# Patient Record
Sex: Female | Born: 1950
Health system: Southern US, Community
[De-identification: ages and names within clinical notes are randomized; demographics above are authoritative.]

## PROBLEM LIST (undated history)

## (undated) DIAGNOSIS — I10 Essential (primary) hypertension: Secondary | ICD-10-CM

## (undated) DIAGNOSIS — R609 Edema, unspecified: Secondary | ICD-10-CM

## (undated) DIAGNOSIS — R0902 Hypoxemia: Secondary | ICD-10-CM

## (undated) DIAGNOSIS — E041 Nontoxic single thyroid nodule: Secondary | ICD-10-CM

## (undated) DIAGNOSIS — E785 Hyperlipidemia, unspecified: Secondary | ICD-10-CM

## (undated) DIAGNOSIS — E669 Obesity, unspecified: Secondary | ICD-10-CM

## (undated) DIAGNOSIS — I499 Cardiac arrhythmia, unspecified: Secondary | ICD-10-CM

## (undated) DIAGNOSIS — G4730 Sleep apnea, unspecified: Secondary | ICD-10-CM

## (undated) DIAGNOSIS — H269 Unspecified cataract: Secondary | ICD-10-CM

## (undated) DIAGNOSIS — D219 Benign neoplasm of connective and other soft tissue, unspecified: Secondary | ICD-10-CM

## (undated) DIAGNOSIS — M1990 Unspecified osteoarthritis, unspecified site: Secondary | ICD-10-CM

## (undated) HISTORY — DX: Essential (primary) hypertension: I10

## (undated) HISTORY — DX: Unspecified cataract: H26.9

## (undated) HISTORY — DX: Unspecified osteoarthritis, unspecified site: M19.90

## (undated) HISTORY — DX: Edema, unspecified: R60.9

## (undated) HISTORY — DX: Hyperlipidemia, unspecified: E78.5

## (undated) HISTORY — DX: Benign neoplasm of connective and other soft tissue, unspecified: D21.9

## (undated) HISTORY — DX: Sleep apnea, unspecified: G47.30

## (undated) HISTORY — DX: Cardiac arrhythmia, unspecified: I49.9

## (undated) HISTORY — PX: TONSILLECTOMY: SUR1361

## (undated) HISTORY — DX: Obesity, unspecified: E66.9

## (undated) HISTORY — PX: HERNIA REPAIR: SHX51

## (undated) HISTORY — PX: MOUTH SURGERY: SHX715

## (undated) HISTORY — PX: OTHER SURGICAL HISTORY: SHX169

## (undated) HISTORY — DX: Hypoxemia: R09.02

## (undated) HISTORY — DX: Nontoxic single thyroid nodule: E04.1

## (undated) HISTORY — PX: TOTAL ABDOMINAL HYSTERECTOMY: SHX209

---

## 1997-09-08 ENCOUNTER — Ambulatory Visit (HOSPITAL_COMMUNITY): Admission: RE | Admit: 1997-09-08 | Discharge: 1997-09-08 | Payer: Self-pay | Admitting: Gynecology

## 1997-10-13 ENCOUNTER — Ambulatory Visit (HOSPITAL_COMMUNITY): Admission: AD | Admit: 1997-10-13 | Discharge: 1997-10-13 | Payer: Self-pay | Admitting: Gynecology

## 1998-01-29 ENCOUNTER — Other Ambulatory Visit: Admission: RE | Admit: 1998-01-29 | Discharge: 1998-01-29 | Payer: Self-pay | Admitting: Obstetrics and Gynecology

## 1998-04-11 ENCOUNTER — Other Ambulatory Visit: Admission: RE | Admit: 1998-04-11 | Discharge: 1998-04-11 | Payer: Self-pay | Admitting: Obstetrics and Gynecology

## 1999-03-07 ENCOUNTER — Ambulatory Visit (HOSPITAL_COMMUNITY): Admission: RE | Admit: 1999-03-07 | Discharge: 1999-03-07 | Payer: Self-pay | Admitting: Obstetrics and Gynecology

## 1999-03-07 ENCOUNTER — Encounter: Payer: Self-pay | Admitting: Obstetrics and Gynecology

## 1999-05-13 ENCOUNTER — Other Ambulatory Visit: Admission: RE | Admit: 1999-05-13 | Discharge: 1999-05-13 | Payer: Self-pay | Admitting: Obstetrics and Gynecology

## 2000-06-12 ENCOUNTER — Encounter: Payer: Self-pay | Admitting: Obstetrics and Gynecology

## 2000-06-12 ENCOUNTER — Ambulatory Visit (HOSPITAL_COMMUNITY): Admission: RE | Admit: 2000-06-12 | Discharge: 2000-06-12 | Payer: Self-pay | Admitting: Obstetrics and Gynecology

## 2000-09-23 ENCOUNTER — Other Ambulatory Visit: Admission: RE | Admit: 2000-09-23 | Discharge: 2000-09-23 | Payer: Self-pay | Admitting: Obstetrics and Gynecology

## 2001-06-29 ENCOUNTER — Encounter: Payer: Self-pay | Admitting: Internal Medicine

## 2001-06-29 ENCOUNTER — Encounter: Admission: RE | Admit: 2001-06-29 | Discharge: 2001-06-29 | Payer: Self-pay | Admitting: Internal Medicine

## 2002-01-28 ENCOUNTER — Ambulatory Visit (HOSPITAL_COMMUNITY): Admission: RE | Admit: 2002-01-28 | Discharge: 2002-01-28 | Payer: Self-pay | Admitting: Internal Medicine

## 2002-01-28 ENCOUNTER — Encounter: Payer: Self-pay | Admitting: Internal Medicine

## 2004-04-24 ENCOUNTER — Ambulatory Visit: Payer: Self-pay | Admitting: Internal Medicine

## 2004-05-06 ENCOUNTER — Other Ambulatory Visit: Admission: RE | Admit: 2004-05-06 | Discharge: 2004-05-06 | Payer: Self-pay | Admitting: Obstetrics and Gynecology

## 2004-11-05 ENCOUNTER — Encounter: Payer: Self-pay | Admitting: Internal Medicine

## 2004-11-27 ENCOUNTER — Ambulatory Visit: Payer: Self-pay | Admitting: Internal Medicine

## 2005-02-25 ENCOUNTER — Ambulatory Visit: Payer: Self-pay | Admitting: Internal Medicine

## 2005-02-28 ENCOUNTER — Ambulatory Visit: Payer: Self-pay | Admitting: Internal Medicine

## 2005-03-25 ENCOUNTER — Ambulatory Visit: Payer: Self-pay | Admitting: Family Medicine

## 2005-04-01 ENCOUNTER — Ambulatory Visit: Payer: Self-pay | Admitting: Internal Medicine

## 2005-04-08 ENCOUNTER — Ambulatory Visit: Payer: Self-pay | Admitting: Internal Medicine

## 2005-06-26 ENCOUNTER — Encounter: Admission: RE | Admit: 2005-06-26 | Discharge: 2005-06-26 | Payer: Self-pay | Admitting: Internal Medicine

## 2005-06-26 ENCOUNTER — Ambulatory Visit: Payer: Self-pay | Admitting: Internal Medicine

## 2005-07-03 ENCOUNTER — Ambulatory Visit: Payer: Self-pay | Admitting: Internal Medicine

## 2006-06-15 ENCOUNTER — Ambulatory Visit: Payer: Self-pay | Admitting: Internal Medicine

## 2006-12-28 DIAGNOSIS — J4540 Moderate persistent asthma, uncomplicated: Secondary | ICD-10-CM | POA: Insufficient documentation

## 2006-12-28 DIAGNOSIS — J45909 Unspecified asthma, uncomplicated: Secondary | ICD-10-CM | POA: Insufficient documentation

## 2006-12-28 DIAGNOSIS — J45901 Unspecified asthma with (acute) exacerbation: Secondary | ICD-10-CM | POA: Insufficient documentation

## 2007-03-05 ENCOUNTER — Ambulatory Visit: Payer: Self-pay | Admitting: Internal Medicine

## 2007-03-05 DIAGNOSIS — I10 Essential (primary) hypertension: Secondary | ICD-10-CM | POA: Insufficient documentation

## 2007-03-05 DIAGNOSIS — R000 Tachycardia, unspecified: Secondary | ICD-10-CM | POA: Insufficient documentation

## 2007-03-26 ENCOUNTER — Ambulatory Visit: Payer: Self-pay | Admitting: Internal Medicine

## 2007-03-26 LAB — CONVERTED CEMR LAB
Bilirubin Urine: NEGATIVE
Blood in Urine, dipstick: NEGATIVE
Glucose, Urine, Semiquant: NEGATIVE
Ketones, urine, test strip: NEGATIVE
Nitrite: NEGATIVE
Specific Gravity, Urine: 1.015
Urobilinogen, UA: 0.2
WBC Urine, dipstick: NEGATIVE
pH: 7.5

## 2007-03-29 LAB — CONVERTED CEMR LAB
ALT: 17 units/L (ref 0–35)
AST: 16 units/L (ref 0–37)
Albumin: 3.3 g/dL — ABNORMAL LOW (ref 3.5–5.2)
Alkaline Phosphatase: 85 units/L (ref 39–117)
BUN: 9 mg/dL (ref 6–23)
Basophils Absolute: 0 10*3/uL (ref 0.0–0.1)
Basophils Relative: 0.1 % (ref 0.0–1.0)
Bilirubin, Direct: 0.1 mg/dL (ref 0.0–0.3)
CO2: 33 meq/L — ABNORMAL HIGH (ref 19–32)
Calcium: 9 mg/dL (ref 8.4–10.5)
Chloride: 98 meq/L (ref 96–112)
Cholesterol: 192 mg/dL (ref 0–200)
Creatinine, Ser: 0.7 mg/dL (ref 0.4–1.2)
Eosinophils Absolute: 0.6 10*3/uL (ref 0.0–0.6)
Eosinophils Relative: 6.5 % — ABNORMAL HIGH (ref 0.0–5.0)
Free T4: 1.1 ng/dL (ref 0.6–1.6)
GFR calc Af Amer: 112 mL/min
GFR calc non Af Amer: 92 mL/min
Glucose, Bld: 95 mg/dL (ref 70–99)
HCT: 36.7 % (ref 36.0–46.0)
HDL: 29.9 mg/dL — ABNORMAL LOW (ref >39.0)
Hemoglobin: 12.2 g/dL (ref 12.0–15.0)
LDL Cholesterol: 145 mg/dL — ABNORMAL HIGH (ref 0–99)
Lymphocytes Relative: 19.9 % (ref 12.0–46.0)
MCHC: 33.2 g/dL (ref 30.0–36.0)
MCV: 83.7 fL (ref 78.0–100.0)
Monocytes Absolute: 0.6 10*3/uL (ref 0.2–0.7)
Monocytes Relative: 6.6 % (ref 3.0–11.0)
Neutro Abs: 5.8 10*3/uL (ref 1.4–7.7)
Neutrophils Relative %: 66.9 % (ref 43.0–77.0)
Platelets: 336 10*3/uL (ref 150–400)
Potassium: 4.3 meq/L (ref 3.5–5.1)
RBC: 4.38 M/uL (ref 3.87–5.11)
RDW: 13.7 % (ref 11.5–14.6)
Sodium: 139 meq/L (ref 135–145)
T3, Free: 3.3 pg/mL (ref 2.3–4.2)
TSH: 3.7 microintl units/mL (ref 0.35–5.50)
Total Bilirubin: 0.7 mg/dL (ref 0.3–1.2)
Total CHOL/HDL Ratio: 6.4
Total Protein: 6.4 g/dL (ref 6.0–8.3)
Triglycerides: 87 mg/dL (ref 0–149)
VLDL: 17 mg/dL (ref 0–40)
WBC: 8.8 10*3/uL (ref 4.5–10.5)

## 2007-04-06 ENCOUNTER — Ambulatory Visit: Payer: Self-pay | Admitting: Internal Medicine

## 2007-04-06 DIAGNOSIS — E782 Mixed hyperlipidemia: Secondary | ICD-10-CM | POA: Insufficient documentation

## 2007-04-21 ENCOUNTER — Telehealth: Payer: Self-pay | Admitting: Internal Medicine

## 2007-05-20 ENCOUNTER — Ambulatory Visit: Payer: Self-pay | Admitting: Internal Medicine

## 2007-05-24 LAB — CONVERTED CEMR LAB
ALT: 17 units/L (ref 0–35)
AST: 16 units/L (ref 0–37)
Cholesterol: 166 mg/dL (ref 0–200)
HDL: 34 mg/dL — ABNORMAL LOW (ref >39.0)
LDL Cholesterol: 120 mg/dL — ABNORMAL HIGH (ref 0–99)
Total CHOL/HDL Ratio: 4.9
Triglycerides: 60 mg/dL (ref 0–149)
VLDL: 12 mg/dL (ref 0–40)

## 2007-07-26 ENCOUNTER — Ambulatory Visit: Payer: Self-pay | Admitting: Internal Medicine

## 2007-07-26 DIAGNOSIS — T887XXA Unspecified adverse effect of drug or medicament, initial encounter: Secondary | ICD-10-CM | POA: Insufficient documentation

## 2007-07-28 ENCOUNTER — Telehealth: Payer: Self-pay | Admitting: *Deleted

## 2007-07-28 LAB — CONVERTED CEMR LAB
ALT: 17 units/L (ref 0–35)
AST: 19 units/L (ref 0–37)
Albumin: 3.2 g/dL — ABNORMAL LOW (ref 3.5–5.2)
Alkaline Phosphatase: 83 units/L (ref 39–117)
Bilirubin, Direct: 0.1 mg/dL (ref 0.0–0.3)
Cholesterol: 144 mg/dL (ref 0–200)
HDL: 32.8 mg/dL — ABNORMAL LOW (ref >39.0)
LDL Cholesterol: 99 mg/dL (ref 0–99)
Total Bilirubin: 0.6 mg/dL (ref 0.3–1.2)
Total CHOL/HDL Ratio: 4.4
Total Protein: 6 g/dL (ref 6.0–8.3)
Triglycerides: 61 mg/dL (ref 0–149)
VLDL: 12 mg/dL (ref 0–40)

## 2007-11-22 ENCOUNTER — Ambulatory Visit: Payer: Self-pay | Admitting: Internal Medicine

## 2007-11-22 DIAGNOSIS — K625 Hemorrhage of anus and rectum: Secondary | ICD-10-CM | POA: Insufficient documentation

## 2007-11-22 LAB — CONVERTED CEMR LAB: Hemoglobin: 13 g/dL

## 2007-12-16 ENCOUNTER — Ambulatory Visit: Payer: Self-pay | Admitting: Gastroenterology

## 2007-12-27 ENCOUNTER — Ambulatory Visit: Payer: Self-pay | Admitting: Gastroenterology

## 2007-12-27 HISTORY — PX: COLONOSCOPY: SHX174

## 2007-12-27 LAB — HM COLONOSCOPY

## 2008-01-11 ENCOUNTER — Telehealth: Payer: Self-pay | Admitting: Family Medicine

## 2008-01-11 ENCOUNTER — Emergency Department (HOSPITAL_COMMUNITY): Admission: EM | Admit: 2008-01-11 | Discharge: 2008-01-12 | Payer: Self-pay | Admitting: *Deleted

## 2008-01-13 ENCOUNTER — Ambulatory Visit: Payer: Self-pay | Admitting: Family Medicine

## 2008-01-13 DIAGNOSIS — J209 Acute bronchitis, unspecified: Secondary | ICD-10-CM | POA: Insufficient documentation

## 2008-01-13 DIAGNOSIS — E042 Nontoxic multinodular goiter: Secondary | ICD-10-CM | POA: Insufficient documentation

## 2008-01-16 ENCOUNTER — Telehealth: Payer: Self-pay | Admitting: Internal Medicine

## 2008-01-17 ENCOUNTER — Encounter: Admission: RE | Admit: 2008-01-17 | Discharge: 2008-01-17 | Payer: Self-pay | Admitting: Family Medicine

## 2008-01-20 ENCOUNTER — Ambulatory Visit: Payer: Self-pay | Admitting: Family Medicine

## 2008-01-21 LAB — CONVERTED CEMR LAB
Free T4: 1.2 ng/dL (ref 0.6–1.6)
T3, Free: 3.2 pg/mL (ref 2.3–4.2)
TSH: 2.41 microintl units/mL (ref 0.35–5.50)

## 2008-01-25 ENCOUNTER — Encounter: Payer: Self-pay | Admitting: Family Medicine

## 2008-01-26 ENCOUNTER — Telehealth: Payer: Self-pay | Admitting: Family Medicine

## 2008-03-28 ENCOUNTER — Ambulatory Visit: Payer: Self-pay | Admitting: Internal Medicine

## 2008-03-28 DIAGNOSIS — K429 Umbilical hernia without obstruction or gangrene: Secondary | ICD-10-CM | POA: Insufficient documentation

## 2008-05-10 ENCOUNTER — Ambulatory Visit: Payer: Self-pay | Admitting: Internal Medicine

## 2008-05-15 LAB — CONVERTED CEMR LAB
ALT: 19 units/L (ref 0–35)
AST: 18 units/L (ref 0–37)
Albumin: 3.3 g/dL — ABNORMAL LOW (ref 3.5–5.2)
Alkaline Phosphatase: 84 units/L (ref 39–117)
BUN: 13 mg/dL (ref 6–23)
Bilirubin, Direct: 0.1 mg/dL (ref 0.0–0.3)
CO2: 34 meq/L — ABNORMAL HIGH (ref 19–32)
Calcium: 9 mg/dL (ref 8.4–10.5)
Chloride: 106 meq/L (ref 96–112)
Cholesterol: 144 mg/dL (ref 0–200)
Creatinine, Ser: 0.8 mg/dL (ref 0.4–1.2)
GFR calc Af Amer: 95 mL/min
GFR calc non Af Amer: 79 mL/min
Glucose, Bld: 96 mg/dL (ref 70–99)
HDL: 41.4 mg/dL (ref >39.0)
LDL Cholesterol: 91 mg/dL (ref 0–99)
Potassium: 4.3 meq/L (ref 3.5–5.1)
Sodium: 141 meq/L (ref 135–145)
TSH: 2.71 microintl units/mL (ref 0.35–5.50)
Total Bilirubin: 0.9 mg/dL (ref 0.3–1.2)
Total CHOL/HDL Ratio: 3.5
Total Protein: 6.9 g/dL (ref 6.0–8.3)
Triglycerides: 58 mg/dL (ref 0–149)
VLDL: 12 mg/dL (ref 0–40)

## 2008-09-25 ENCOUNTER — Ambulatory Visit: Payer: Self-pay | Admitting: Internal Medicine

## 2008-10-20 ENCOUNTER — Ambulatory Visit: Payer: Self-pay | Admitting: Internal Medicine

## 2008-10-20 LAB — CONVERTED CEMR LAB: Rapid Strep: NEGATIVE

## 2009-01-24 ENCOUNTER — Telehealth: Payer: Self-pay | Admitting: *Deleted

## 2009-01-29 DIAGNOSIS — E041 Nontoxic single thyroid nodule: Secondary | ICD-10-CM

## 2009-01-29 HISTORY — DX: Nontoxic single thyroid nodule: E04.1

## 2009-02-06 ENCOUNTER — Other Ambulatory Visit: Admission: RE | Admit: 2009-02-06 | Discharge: 2009-02-06 | Payer: Self-pay | Admitting: Interventional Radiology

## 2009-02-06 ENCOUNTER — Encounter: Payer: Self-pay | Admitting: Internal Medicine

## 2009-02-06 ENCOUNTER — Encounter (INDEPENDENT_AMBULATORY_CARE_PROVIDER_SITE_OTHER): Payer: Self-pay | Admitting: Interventional Radiology

## 2009-02-06 ENCOUNTER — Encounter: Admission: RE | Admit: 2009-02-06 | Discharge: 2009-02-06 | Payer: Self-pay | Admitting: Internal Medicine

## 2009-02-19 ENCOUNTER — Telehealth: Payer: Self-pay | Admitting: *Deleted

## 2009-05-08 ENCOUNTER — Telehealth: Payer: Self-pay | Admitting: Internal Medicine

## 2009-07-16 ENCOUNTER — Ambulatory Visit: Payer: Self-pay | Admitting: Internal Medicine

## 2009-07-23 ENCOUNTER — Encounter: Payer: Self-pay | Admitting: *Deleted

## 2009-07-23 LAB — CONVERTED CEMR LAB
ALT: 22 units/L (ref 0–35)
AST: 19 units/L (ref 0–37)
Albumin: 3.5 g/dL (ref 3.5–5.2)
Alkaline Phosphatase: 93 units/L (ref 39–117)
BUN: 10 mg/dL (ref 6–23)
Bilirubin, Direct: 0 mg/dL (ref 0.0–0.3)
CO2: 34 meq/L — ABNORMAL HIGH (ref 19–32)
Calcium: 8.8 mg/dL (ref 8.4–10.5)
Chloride: 101 meq/L (ref 96–112)
Cholesterol: 156 mg/dL (ref 0–200)
Creatinine, Ser: 0.7 mg/dL (ref 0.4–1.2)
Free T4: 1 ng/dL (ref 0.6–1.6)
GFR calc non Af Amer: 91.25 mL/min (ref >60)
Glucose, Bld: 112 mg/dL — ABNORMAL HIGH (ref 70–99)
HDL: 41.1 mg/dL (ref >39.00)
LDL Cholesterol: 96 mg/dL (ref 0–99)
Potassium: 3.9 meq/L (ref 3.5–5.1)
Sodium: 141 meq/L (ref 135–145)
TSH: 2.88 microintl units/mL (ref 0.35–5.50)
Total Bilirubin: 0.5 mg/dL (ref 0.3–1.2)
Total CHOL/HDL Ratio: 4
Total Protein: 7.1 g/dL (ref 6.0–8.3)
Triglycerides: 93 mg/dL (ref 0.0–149.0)
VLDL: 18.6 mg/dL (ref 0.0–40.0)

## 2009-12-11 ENCOUNTER — Encounter: Payer: Self-pay | Admitting: Internal Medicine

## 2010-01-28 ENCOUNTER — Encounter: Payer: Self-pay | Admitting: Internal Medicine

## 2010-01-31 ENCOUNTER — Ambulatory Visit: Payer: Self-pay | Admitting: Internal Medicine

## 2010-01-31 DIAGNOSIS — R82998 Other abnormal findings in urine: Secondary | ICD-10-CM | POA: Insufficient documentation

## 2010-01-31 DIAGNOSIS — T7840XA Allergy, unspecified, initial encounter: Secondary | ICD-10-CM | POA: Insufficient documentation

## 2010-01-31 LAB — CONVERTED CEMR LAB
Bilirubin Urine: NEGATIVE
Blood in Urine, dipstick: NEGATIVE
Glucose, Urine, Semiquant: NEGATIVE
Ketones, urine, test strip: NEGATIVE
Nitrite: NEGATIVE
Protein, U semiquant: NEGATIVE
Specific Gravity, Urine: 1.01
Urobilinogen, UA: 0.2
WBC Urine, dipstick: NEGATIVE
pH: 7

## 2010-03-13 ENCOUNTER — Telehealth: Payer: Self-pay | Admitting: Internal Medicine

## 2010-04-03 ENCOUNTER — Telehealth: Payer: Self-pay | Admitting: *Deleted

## 2010-04-29 ENCOUNTER — Other Ambulatory Visit: Payer: Self-pay | Admitting: Obstetrics and Gynecology

## 2010-04-29 ENCOUNTER — Ambulatory Visit
Admission: RE | Admit: 2010-04-29 | Discharge: 2010-04-29 | Payer: Self-pay | Source: Home / Self Care | Attending: Obstetrics and Gynecology | Admitting: Obstetrics and Gynecology

## 2010-04-29 ENCOUNTER — Other Ambulatory Visit (HOSPITAL_COMMUNITY)
Admission: RE | Admit: 2010-04-29 | Discharge: 2010-04-29 | Disposition: A | Discharge status: Home / Self Care | Payer: Self-pay | Source: Ambulatory Visit | Attending: Obstetrics and Gynecology | Admitting: Obstetrics and Gynecology

## 2010-04-29 DIAGNOSIS — Z124 Encounter for screening for malignant neoplasm of cervix: Secondary | ICD-10-CM | POA: Insufficient documentation

## 2010-04-30 NOTE — Assessment & Plan Note (Signed)
Summary: RECTAL BLEED/YF   History of Present Illness Visit Type: consult Primary GI MD: Sheryn Bison MD FACP FAGA Primary Provider: Berniece Andreas, MD Requesting Provider: Berniece Andreas, MD Chief Complaint: rectal bleeding History of Present Illness:   This patient is a 60 year old white female referred through the courtesy of Dr. Fabian Sharp for evaluation of intermittent rectal bleeding and mild constipation without associated rectal pain.  For several years [name] Has had asymptomatic intermittent Rectal bleeding without rectal pain. She has mild constipation takes daily Metamucil. She has not had previous barium studies or colonoscopy exams. She denies any upper gastrointestinal or any hepatobiliary problems. Her appetite is good and her weight is stable. She is in good health except for mild hypertension and hyperlipidemia. Family history is noncontributory.    GI Review of Systems      Denies abdominal pain, acid reflux, belching, bloating, chest pain, dysphagia with liquids, dysphagia with solids, heartburn, loss of appetite, nausea, vomiting, vomiting blood, weight loss, and  weight gain.      Reports constipation and  rectal bleeding.     Denies anal fissure, black tarry stools, change in bowel habit, diarrhea, diverticulosis, fecal incontinence, heme positive stool, hemorrhoids, irritable bowel syndrome, jaundice, light color stool, liver problems, and  rectal pain.     Prior Medications Reviewed Using: List Brought by Patient  Updated Prior Medication List: ADVAIR DISKUS 100-50 MCG/DOSE MISC (FLUTICASONE-SALMETEROL)  PROAIR HFA 108 (90 BASE) MCG/ACT AERS (ALBUTEROL SULFATE) Inhale 2 puff as directed four times a day LISINOPRIL 20 MG  TABS (LISINOPRIL) 1 by mouth once daily GLUCOSAMINE-VITAMIN D 1000-200 MG-UNIT TABS (GLUCOSAMINE-VITAMIN D)  MEVACOR 40 MG  TABS (LOVASTATIN) 1 by mouth once daily B-100 COMPLEX  TABS (VITAMINS-LIPOTROPICS)  METAMUCIL 30.9 % POWD  (PSYLLIUM) once daily MULTIVITAL  CHEW (MULTIPLE VITAMINS-MINERALS) once daily  Current Allergies: No known allergies   Past Medical History:    Reviewed history from 11/22/2007 and no changes required:       HT        Obesity       Fibroids       Edema       Asthma   Past Surgical History:    Reviewed history from 11/22/2007 and no changes required:       Denies surgical history           Family History:    Reviewed history from 11/22/2007 and no changes required:       Family History of Alcoholism/Addiction: Uncle       Family History of Arthritis: Mother       Family History Other cancer       Family History of Cardiovascular disorder: Father       Family History of Neurological disorder: Mother       neg colon cancer or bowel diseases         Family History of Pancreatic Cancer:Mother:       Family History of Heart Disease: Brother x2       Thyroid disease: Sister  Social History:    Reviewed history from 11/22/2007 and no changes required:       Occupation:Admin assistant       Single       Never Smoked       Alcohol use-ocassional       Drug use-no       Regular exercise-no      Risk Factors:  Caffeine use:  1 drinks per day Seatbelt use:  100 % Sun Exposure:  occasionally   Review of Systems  The patient denies allergy/sinus, anemia, anxiety-new, arthritis/joint pain, back pain, blood in urine, breast changes/lumps, change in vision, confusion, cough, coughing up blood, depression-new, fainting, fatigue, fever, headaches-new, hearing problems, heart murmur, heart rhythm changes, itching, menstrual pain, muscle pains/cramps, night sweats, nosebleeds, pregnancy symptoms, shortness of breath, skin rash, sleeping problems, sore throat, swelling of feet/legs, swollen lymph glands, thirst - excessive , urination - excessive , urination changes/pain, urine leakage, vision changes, and voice change.     Vital Signs:  Patient Profile:   60 Years Old  Female Height:     68 inches Weight:      294.38 pounds BMI:     44.92 Pulse rate:   80 / minute Pulse rhythm:   regular BP sitting:   128 / 74  (left arm)  Vitals Entered By: June McMurray CMA (December 16, 2007 8:57 AM)                  Physical Exam  General:     Well developed, well nourished, no acute distress.healthy appearing.   Head:     Normocephalic and atraumatic. Eyes:     PERRLA, no icterus. Neck:     Supple; no masses or thyromegaly. Lungs:     Clear throughout to auscultation. Heart:     Regular rate and rhythm; no murmurs, rubs,  or bruits. Abdomen:     Soft, nontender and nondistended. No masses, hepatosplenomegaly or hernias noted. Normal bowel sounds. Rectal:     Normal exam.hemocult negative.   Extremities:     She has rather marked edema and pigmentary changes of stasis dermatitis in both pretibial areas. I cannot appreciate acute phlebitis or evidence of cellulitis. Neurologic:     Alert and  oriented x4;  grossly normal neurologically. Psych:     Alert and cooperative. Normal mood and affect.    Impression & Recommendations:  Problem # 1:  RECTAL BLEEDING (ICD-569.3) Assessment: Unchanged I have scheduled her for colonoscopy to exclude underlying colonic polyposis. I cannot appreciate hemorrhoids or other abnormalities on rectal exam today. I have decided to check a CBC and anemia profile also. She is to continue her Metamucil and high-fiber diet as tolerated. Orders: Colonoscopy (Colon)   Problem # 2:  HYPERTENSION (ICD-401.9) Assessment: Unchanged Continue medications as per Dr. Fabian Sharp  Problem # 3:  HYPERLIPIDEMIA (ICD-272.2) Assessment: Improved   Patient Instructions: 1)  Copy Sent To:Dr. Berniece Andreas 2)  Constipation and  Hemorrhoids brochure given. 3)  Colonoscopy and Flexible Sigmoidoscopy brochure given. 4)  Conscious Sedation brochure given. 5)  [facility] Patient Information Guide given to patient. 6)   Seynabou Fults was given the [facility] Prep today.    ]

## 2010-04-30 NOTE — Progress Notes (Signed)
Summary: WANTS THYROID BIOPSY SCHEDULED  Phone Note Call from Patient Call back at Home Phone [phone]   Caller: Patient-LIVE CALL Reason for Call: Referral Summary of Call: PT WOULD LIKE TO GO AHEAD WITH THE BIOPSY OF THE THYROID....NEEDS TO KNOW IF IT IS BILATERAL OR UNILATERAL? SHE HAD AN ULTRASOUND OVER A YEAR AGO. IS ANOTHER ULTRASOUND NECESSARY. SHE NEED TO KNOW BEACAUSE SHE IS SELF-PAY. HAVE TERRI TO CALL HER WITH THE ANSWERS AND BIOPSY APPT. PT WOULD LIKE TO GO TO [facility]. Initial call taken by: Warnell Forester,  January 24, 2009 11:21 AM  Follow-up for Phone Call        i am not sure if needs to get another [place]   .  Call radiology and see if  we need to do this ahead of time  otherwise  schedule biopsy.of thyroid.  Follow-up by: Madelin Headings MD,  January 24, 2009 2:28 PM  Additional Follow-up for Phone Call Additional follow up Details #1::        [facility] and spoke to [place]. She states that she does need another thyroid US. Order sent to [facility] Pt aware of this. Additional Follow-up by: Romualdo Bolk, CMA (AAMA),  January 24, 2009 4:53 PM

## 2010-04-30 NOTE — Assessment & Plan Note (Signed)
Summary: 6 month rov/njr rsc bmp/njr rsc with pt/mhf   Vital Signs:  Patient profile:   60 year old female Menstrual status:  postmenopausal Height:      68 inches Weight:      305 pounds BMI:     46.54 Pulse rate:   100 / minute BP sitting:   142 / 78  (left arm) Cuff size:   large  Vitals Entered By: Army Fossa CMA (September 25, 2008 11:03 AM) CC: 6 month ROV on BP , Hypertension Management  years   days  Menstrual Status postmenopausal   History of Present Illness: Amy Stokes  comes in fror follow up   She still has no insurance and finances are liniting.  BP   ok at blood donation.   Advair hs helps :  only ocass wheeziy at night .  Thyroid nodule  seems no change ot her. Is aware  biopsy is rec  Lipids  nose of meds   Hypertension History:      She complains of side effects from treatment, but denies headache, chest pain, palpitations, dyspnea with exertion, orthopnea, PND, peripheral edema, visual symptoms, neurologic problems, and syncope.  She notes the following problems with antihypertensive medication side effects: dry cough occasionally. [name]        Positive major cardiovascular risk factors include female age 30 years old or older, hyperlipidemia, and hypertension.  Negative major cardiovascular risk factors include non-tobacco-user status.     Preventive Screening-Counseling & Management  Alcohol-Tobacco     Alcohol drinks/day: <1     Smoking Status: quit  Caffeine-Diet-Exercise     Caffeine use/day: 1-2 cups a day      Does Patient Exercise: no  Current Medications (verified): 1)  Advair Diskus 100-50 Mcg/dose Misc (Fluticasone-Salmeterol) .... Once Daily 2)  Proair Hfa 108 (90 Base) Mcg/act Aers (Albuterol Sulfate) .... Inhale 2 Puff As Directed Four Times A Day 3)  Lisinopril 20 Mg  Tabs (Lisinopril) .[name].. 1 By Mouth Once Daily 4)  Glucosamine-Vitamin D 1000-200 Mg-Unit Tabs (Glucosamine-Vitamin D) 5)  Mevacor 40 Mg  Tabs (Lovastatin) .[name].. 1 By Mouth  Once Daily 6)  B-100 Complex  Tabs (Vitamins-Lipotropics) 7)  Metamucil 30.9 % Powd (Psyllium) .... Once Daily 8)  Multivital  Chew (Multiple Vitamins-Minerals) .... Once Daily  Allergies (verified): No Known Drug Allergies  Past History:  Past medical, surgical, family and social histories (including risk factors) reviewed, and no changes noted (except as noted below).  Past Medical History: Reviewed history from 03/28/2008 and no changes required. HT  Obesity Fibroids Edema Asthma     Past Surgical History: Reviewed history from 11/22/2007 and no changes required. Denies surgical history    Family History: Reviewed history from 03/28/2008 and no changes required. Family History of Alcoholism/Addiction: Uncle Family History of Arthritis: Mother Family History Other cancer Family History of Cardiovascular disorder: Father Family History of Neurological disorder: Mother neg colon cancer or bowel diseases   Family History of Pancreatic Cancer:Mother: Family History of Heart Disease: Brother x2 Thyroid disease: Sister  ? nodules   Social History: Reviewed history from 03/28/2008 and no changes required. Occupation:Admin assistant Single Never Smoked Alcohol use-ocassional Drug use-no Regular exercise-no      Smoking Status:  quit Caffeine use/day:  1-2 cups a day   Review of Systems  The patient denies anorexia, fever, chest pain, syncope, dyspnea on exertion, abdominal pain, unusual weight change, abnormal bleeding, enlarged lymph nodes, and angioedema.    Physical Exam  General:  Well-developed,well-nourished,in no acute distress; alert,appropriate and cooperative throughout examination Head:  normocephalic and atraumatic.   Neck:  thyroid palpable no frmness or tendernss   left  Lungs:  Normal respiratory effort, chest expands symmetrically. Lungs are clear to auscultation, no crackles or wheezes. Heart:  Normal rate and regular rhythm. S1 and S2 normal  without gallop, murmur, click, rub or other extra sounds. Pulses:  pulses intact without delay   Extremities:  no clubbing cyanosis or tace edema  Skin:  turgor normal and color normal.   Cervical Nodes:  No lymphadenopathy noted Psych:  Normal eye contact, appropriate affect. Cognition appears normal.    Impression & Recommendations:  Problem # 1:  THYROID NODULE (ICD-241.0)  complex and seeming ly no change   she should look into  cost  of getting th biopsy   Problem # 2:  HYPERTENSION (ICD-401.9)  Her updated medication list for this problem includes:    Lisinopril 20 Mg Tabs (Lisinopril) .[name]... 1 by mouth once daily  BP today: 142/78 Prior BP: 128/68 (03/28/2008)  10 Yr Risk Heart Disease: 11 % Prior 10 Yr Risk Heart Disease: 17 % (04/06/2007)  Labs Reviewed: K+: 4.3 (05/10/2008) Creat: : 0.8 (05/10/2008)   Chol: 144 (05/10/2008)   HDL: 41.4 (05/10/2008)   LDL: 91 (05/10/2008)   TG: 58 (05/10/2008)  Problem # 3:  ASTHMA (ICD-493.90) Assessment: Unchanged controlling on one puff per day  .   cost is a factor with no insurance  Her updated medication list for this problem includes:    Advair Diskus 100-50 Mcg/dose Misc (Fluticasone-salmeterol) ..... Once daily    Proair Hfa 108 (90 Base) Mcg/act Aers (Albuterol sulfate) ..... Inhale 2 puff as directed four times a day  Problem # 4:  HYPERLIPIDEMIA (ICD-272.2)  Her updated medication list for this problem includes:    Mevacor 40 Mg Tabs (Lovastatin) .[name]... 1 by mouth once daily  Labs Reviewed: SGOT: 18 (05/10/2008)   SGPT: 19 (05/10/2008)  10 Yr Risk Heart Disease: 11 % Prior 10 Yr Risk Heart Disease: 17 % (04/06/2007)   HDL:41.4 (05/10/2008), 32.8 (07/26/2007)  LDL:91 (05/10/2008), 99 (07/26/2007)  Chol:144 (05/10/2008), 144 (07/26/2007)  Trig:58 (05/10/2008), 61 (07/26/2007)  Complete Medication List: 1)  Advair Diskus 100-50 Mcg/dose Misc (Fluticasone-salmeterol) .... Once daily 2)  Proair Hfa 108 (90 Base) Mcg/act  Aers (Albuterol sulfate) .... Inhale 2 puff as directed four times a day 3)  Lisinopril 20 Mg Tabs (Lisinopril) .[name].. 1 by mouth once daily 4)  Glucosamine-vitamin D 1000-200 Mg-unit Tabs (Glucosamine-vitamin d) 5)  Mevacor 40 Mg Tabs (Lovastatin) .[name].. 1 by mouth once daily 6)  B-100 Complex Tabs (Vitamins-lipotropics) 7)  Metamucil 30.9 % Powd (Psyllium) .... Once daily 8)  Multivital Chew (Multiple vitamins-minerals) .... Once daily  Hypertension Assessment/Plan:      The patient's hypertensive risk group is category B: At least one risk factor (excluding diabetes) with no target organ damage.  Her calculated 10 year risk of coronary heart disease is 11 %.  Today's blood pressure is 142/78.  Her blood pressure goal is < 140/90.  Patient Instructions: 1)  check bp readings 3 x per a week if elevated . 2)  continue medications. 3)  look into  and call when we can do thyroid biopsy

## 2010-04-30 NOTE — Assessment & Plan Note (Signed)
Summary: fup meds//ccm   Vital Signs:  Patient profile:   60 year old female Menstrual status:  postmenopausal Weight:      305 pounds Pulse rate:   100 / minute BP sitting:   140 / 86  (left arm) Cuff size:   large  Vitals Entered By: Romualdo Bolk, CMA [name]) (July 16, 2009 2:44 PM) CC: Follow-up visit on meds, Hypertension Management   History of Present Illness: Amy Stokes comes in comes in today  for  follow up of her multiple medical problems . Since her last visit she still has no health insurance benefits. Her update on med issues :  HT: She presents a log of yher readings and all but one are in goal range .   No se of meds . PUlse tends to run high But she does have  a cough but doesnt nec think its the ace   She has asthma and on inhalers controller advair with out se. Dry cough  trying generic zyrtec with help.  some wheexzng at times   LIPIDS: no  s/e of meds seen  Thyroid  :  aware of indeterminant  bx on her thyroid nodule that is large . No obv change in size or dyshpaghia or pain.    Has no  insurance coverage for surgery.  Obesity  continuing.   aware of interventions.  Hypertension History:      She complains of palpitations, dyspnea with exertion, peripheral edema, and side effects from treatment, but denies headache, chest pain, orthopnea, PND, visual symptoms, neurologic problems, and syncope.  She notes the following problems with antihypertensive medication side effects: Dry cough but has decrease some with cetizine.        Positive major cardiovascular risk factors include female age 64 years old or older, hyperlipidemia, and hypertension.  Negative major cardiovascular risk factors include non-tobacco-user status.     Preventive Screening-Counseling & Management  Alcohol-Tobacco     Alcohol drinks/day: <1     Smoking Status: quit  Caffeine-Diet-Exercise     Caffeine use/day: 1-2 cups a day      Does Patient Exercise: no  Current  Medications (verified): 1)  Advair Diskus 100-50 Mcg/dose Misc (Fluticasone-Salmeterol) .... Once Daily 2)  Proair Hfa 108 (90 Base) Mcg/act Aers (Albuterol Sulfate) .... Inhale 2 Puff As Directed Four Times A Day 3)  Lisinopril 20 Mg  Tabs (Lisinopril) .[name].. 1 By Mouth Once Daily 4)  Mevacor 40 Mg  Tabs (Lovastatin) .[name].. 1 By Mouth Once Daily 5)  Metamucil 30.9 % Powd (Psyllium) .... Once Daily 6)  Multivital  Chew (Multiple Vitamins-Minerals) .... Once Daily 7)  Vitamin D 1000 Unit  Tabs (Cholecalciferol) 8)  Fish Oil   Oil (Fish Oil) 9)  Omega-3 350 Mg Caps (Omega-3 Fatty Acids) 10)  Cetirizine Hcl 10 Mg Tabs (Cetirizine Hcl)  Allergies (verified): No Known Drug Allergies  Past History:  Past medical, surgical, family and social histories (including risk factors) reviewed, and no changes noted (except as noted below).  Past Medical History: HT  Obesity Fibroids Edema Asthma    Thyroid nodule left discovered on chest ct  : Needle  bx" indeterminant " cannot r/o follicular neoplasm)   Nov 2010  Past Surgical History: Denies surgical history thyroid needle bx.     Past History:  Care Management: None Current  Family History: Reviewed history from 03/28/2008 and no changes required. Family History of Alcoholism/Addiction: Uncle Family History of Arthritis: Mother Family History Other cancer  Family History of Cardiovascular disorder: Father Family History of Neurological disorder: Mother neg colon cancer or bowel diseases   Family History of Pancreatic Cancer:Mother: Family History of Heart Disease: Brother x2 Thyroid disease: Sister  ? nodules   Social History: Reviewed history from 03/28/2008 and no changes required. Occupation:Admin assistant  currently unemployed  Married   Never Smoked Alcohol use-ocassional Drug use-no Regular exercise-no    hh of 2    pet cat.   Review of Systems       The patient complains of prolonged cough.  The patient denies  anorexia, fever, weight loss, weight gain, abdominal pain, melena, hematochezia, severe indigestion/heartburn, hematuria, difficulty walking, abnormal bleeding, and enlarged lymph nodes.         inpast hx of irreg palpitations   Physical Exam  General:  Well-developed,well-nourished,in no acute distress; alert,appropriate and cooperative throughout examination Head:  normocephalic and atraumatic.   Eyes:  vision grossly intact, pupils equal, and pupils round.   Ears:  R ear normal, L ear normal, and no external deformities.   Nose:  no external deformity, no external erythema, and no nasal discharge.   Mouth:  pharynx pink and moist.   Neck:  ? prominent left thyroid but no mass felt  no tenderness or ademopathy Lungs:  Normal respiratory effort, chest expands symmetrically. Lungs are clear to auscultation, no crackles or wheezes.no dullness.   Heart:  Normal rate and regular rhythm. S1 and S2 normal without gallop, murmur, click, rub or other extra sounds.no lifts.   Abdomen:  Bowel sounds positive,abdomen soft and non-tender without masses, organomegaly  noted.  3 cm umbi nontender hernia noted  Pulses:  pulses intact without delay   Extremities:  no clubbing cyanosis or 1+ edema  dry skin no break down  Neurologic:  alert & oriented X3 and gait normal.   Skin:  turgor normal and color normal.   Cervical Nodes:  No lymphadenopathy noted Psych:  Oriented X3, good eye contact, not anxious appearing, and not depressed appearing.     Impression & Recommendations:  Problem # 1:  HYPERTENSION (ICD-401.9) controlled  Her updated medication list for this problem includes:    Lisinopril 20 Mg Tabs (Lisinopril) .[name]... 1 by mouth once daily    Losartan Potassium 100 Mg Tabs (Losartan potassium) .[name]... Take 1/2 to 1 by mouth once daily for high blood pressure  Orders: TLB-BMP (Basic Metabolic Panel-BMET) (80048-METABOL)  Problem # 2:  COUGH (ICD-786.2) allergy vs se of ace    no alarm  features to this  but rec a trial of arb perhaps    depending on cost   Problem # 3:  ASTHMA (ICD-493.90) seems stable sample adviar given  Her updated medication list for this problem includes:    Advair Diskus 100-50 Mcg/dose Misc (Fluticasone-salmeterol) ..... Once daily    Proair Hfa 108 (90 Base) Mcg/act Aers (Albuterol sulfate) ..... Inhale 2 puff as directed four times a day  Problem # 4:  THYROID NODULE (ICD-241.0)  left  ? path   should have this removed but no insurance is an issues and no signs currently   Problem # 5:  HYPERLIPIDEMIA (ICD-272.2)  Her updated medication list for this problem includes:    Mevacor 40 Mg Tabs (Lovastatin) .[name]... 1 by mouth once daily  Orders: TLB-Lipid Panel (80061-LIPID) TLB-Hepatic/Liver Function Pnl (80076-HEPATIC)  Problem # 6:  MORBID OBESITY (ICD-278.01) Assessment: Unchanged  Complete Medication List: 1)  Advair Diskus 100-50 Mcg/dose Misc (Fluticasone-salmeterol) .... Once daily  2)  Proair Hfa 108 (90 Base) Mcg/act Aers (Albuterol sulfate) .... Inhale 2 puff as directed four times a day 3)  Lisinopril 20 Mg Tabs (Lisinopril) .[name].. 1 by mouth once daily 4)  Mevacor 40 Mg Tabs (Lovastatin) .[name].. 1 by mouth once daily 5)  Metamucil 30.9 % Powd (Psyllium) .... Once daily 6)  Multivital Chew (Multiple vitamins-minerals) .... Once daily 7)  Vitamin D 1000 Unit Tabs (Cholecalciferol) 8)  Fish Oil Oil (Fish oil) 9)  Omega-3 350 Mg Caps (Omega-3 fatty acids) 10)  Cetirizine Hcl 10 Mg Tabs (Cetirizine hcl) 11)  Losartan Potassium 100 Mg Tabs (Losartan potassium) .... Take 1/2 to 1 by mouth once daily for high blood pressure  Other Orders: TLB-TSH (Thyroid Stimulating Hormone) (84443-TSH) TLB-T4 (Thyrox), Free [phone]) Venipuncture (40981)  Hypertension Assessment/Plan:      The patient's hypertensive risk group is category B: At least one risk factor (excluding diabetes) with no target organ damage.  Her calculated 10 year risk of  coronary heart disease is 11 %.  Today's blood pressure is 140/86.  Her blood pressure goal is < 140/90.  Patient Instructions: 1)  continue on advair  2)  can consider trying a diferrent BP med to see if helps  irritative cough. 3)  You will be informed of lab results when available.  and plan   appropriate follow up .  4)  Losing weight will help you medical condition. Prescriptions: LOSARTAN POTASSIUM 100 MG TABS (LOSARTAN POTASSIUM) take 1/2 to 1 by mouth once daily for high blood pressure  #30 x 3   Entered and Authorized by:   Madelin Headings MD   Signed by:   Madelin Headings MD on 07/16/2009   Method used:   Print then Give to Patient   RxID:   [id number]

## 2010-04-30 NOTE — Progress Notes (Signed)
  Phone Note Call from Patient Call back at Home Phone [phone]   Caller: Patient Summary of Call: respiratory symptoms are better but still with fever 102 last night but no shakes or chills Wonders about the blood culture since she never heard back about that  P: since she feels better and temp down to 100.8 today--can probably wait for now. If she feels worse, she should have someone bring her to the ER for further eval. Checked with [facility]-------------1st culture is coag negative staph and 2nd is negative to date. Discussed with patient--she has no indwelling lines so this is probably a contaminant Initial call taken by: Cindee Salt MD,  January 16, 2008 11:04 AM

## 2010-04-30 NOTE — Assessment & Plan Note (Signed)
Summary: RECTAL BLEEDING/NJR   Vital Signs:  Patient Profile:   60 Years Old Female Weight:      288 pounds Temp:     99.2 degrees F oral Pulse rate:   66 / minute BP sitting:   110 / 60  (left arm) Cuff size:   large  Vitals Entered By: Romualdo Bolk, CMA (November 22, 2007 3:38 PM)                 Chief Complaint:  Rectal Bleeding.  History of Present Illness: Amy Stokes is here foracute appt today  rectal bleeding that has been going on and off for 2 weeks.  2 weeks ago had firm BM and ? discomfort  ....    had blood BR external to stool   then went away and today has returned without hard stool  . seems like a lit to her . Not mixed in  taking   OTC fivvber supp to help.    No fever weight loss diarrhea abdominal pain. No fam hx of colon cancer  Has no insurance  still.        Prior Medications Reviewed Using: Patient Recall  Updated Prior Medication List: ADVAIR DISKUS 100-50 MCG/DOSE MISC (FLUTICASONE-SALMETEROL)  PROAIR HFA 108 (90 BASE) MCG/ACT AERS (ALBUTEROL SULFATE) Inhale 2 puff as directed four times a day LISINOPRIL 20 MG  TABS (LISINOPRIL) 1 by mouth once daily GLUCOSAMINE-VITAMIN D 1000-200 MG-UNIT TABS (GLUCOSAMINE-VITAMIN D)  MEVACOR 40 MG  TABS (LOVASTATIN) 1 by mouth once daily B-100 COMPLEX  TABS (VITAMINS-LIPOTROPICS)   Current Allergies (reviewed today): No known allergies   Past Medical History:    HT     Obesity    Fibroids    Edema    Asthma    Hyperglycemia    Past Surgical History:    Denies surgical history        Family History:    Family History of Alcoholism/Addiction    Family History of Arthritis    Family History Other cancer    Family History of Cardiovascular disorder    Family History of Neurological disorder    neg colon cancer or bowel diseases    Social History:    Occupation:    Single    Never Smoked    Alcohol use-ocassional    Drug use-no    Regular exercise-no       Review of  Systems  The patient denies anorexia, fever, weight loss, abdominal pain, melena, and hematuria.     Physical Exam  General:     alert, well-developed, and well-nourished.   Head:     normocephalic and atraumatic.   Neck:     No deformities, masses, or tenderness noted. Abdomen:     Bowel sounds positive,abdomen soft and non-tender without masses, organomegaly or hernias noted. Rectal:     normal sphincter tone, no masses, and stool positive for occult blood.  minimal  amt of brown stool with heme positive   hemorrhoidal tag noted  Extremities:     no cce  Skin:     turgor normal, color normal, no ecchymoses, and no petechiae.   Cervical Nodes:     No lymphadenopathy noted Psych:     normally interactive, good eye contact, and slightly anxious.      Impression & Recommendations:  Problem # 1:  RECTAL BLEEDING (ICD-569.3) possible fissure or hemorrhoidal  ..stool softeners  but rec GI consultation    Orders: Hgb (69629) Gastroenterology Referral (  GI)   Complete Medication List: 1)  Advair Diskus 100-50 Mcg/dose Misc (Fluticasone-salmeterol) 2)  Proair Hfa 108 (90 Base) Mcg/act Aers (Albuterol sulfate) .... Inhale 2 puff as directed four times a day 3)  Lisinopril 20 Mg Tabs (Lisinopril) .[name].. 1 by mouth once daily 4)  Glucosamine-vitamin D 1000-200 Mg-unit Tabs (Glucosamine-vitamin d) 5)  Mevacor 40 Mg Tabs (Lovastatin) .[name].. 1 by mouth once daily 6)  B-100 Complex Tabs (Vitamins-lipotropics)   Patient Instructions: 1)  stool softener    2)  will call about gi referral and no insurance    ] Laboratory Results   Blood Tests    Date/Time Reported: November 22, 2007 4:23 PM    CBC HGB:  13.0 g/dL   (Normal Range: 42.6-83.4 in Males, 12.0-15.0 in Females) Comments: Wynona Canes, CMA  November 22, 2007 4:23 PM

## 2010-04-30 NOTE — Progress Notes (Signed)
Summary: Temp 102,   Phone Note Call from Patient Call back at Home Phone [phone]   Caller: Patient Call For: Panosh/Fry Summary of Call: Pt began with scratchy feeling in chest yesterday, today temp of 102, using Tylenol for fever, staying home, drinking plenty of fluids, and resting.  Pt has hx of asthma and some other medical hx, has not had the regular flu shot yet.  Pt informed Dr Fabian Sharp out of the office, Dr Clent Ridges will be informed. Initial call taken by: Sid Falcon LPN,  January 11, 2008 4:21 PM  Follow-up for Phone Call        use symptomatic measures as above. we can see her tomorrow if she is not better Follow-up by: Nelwyn Salisbury MD,  January 11, 2008 4:49 PM  Additional Follow-up for Phone Call Additional follow up Details #1::        Pt given appt tomorrow. Additional Follow-up by: Lynann Beaver CMA,  January 11, 2008 5:27 PM

## 2010-04-30 NOTE — Consult Note (Signed)
Summary: [facility] Nose [facility] Thro[facility]-Dr. Ezzard Standing  Ear, Nose & Throat-Dr. Ezzard Standing   Imported By: Maryln Gottron 02/03/2008 13:13:47  _____________________________________________________________________  External Attachment:    Type:   Image     Comment:   External Document

## 2010-04-30 NOTE — Letter (Signed)
Summary: Lipid Letter  [facility]  [address] [place], [place] [address]   Phone: [phone]  Fax: [phone]    07/23/2009  [facility] [address] [place], [place]  [address]  Dear Alvino Chapel:  We have carefully reviewed your last lipid profile from 07/16/2009 and the results are noted below with a summary of recommendations for lipid management.    Cholesterol:       156     Goal: <   HDL "good" Cholesterol:   27.25     Goal: >   LDL "bad" Cholesterol:   96     Goal: <   Triglycerides:       93.0     Goal: <    The rest of your labs are normal. Please call our office in 6-12 months to schedule a follow up appointment in 6-12 months.    TLC Diet (Therapeutic Lifestyle Change): Saturated Fats & Transfatty acids should be kept < 7% of total calories ***Reduce Saturated Fats Polyunstaurated Fat can be up to 10% of total calories Monounsaturated Fat Fat can be up to 20% of total calories Total Fat should be no greater than 25-35% of total calories Carbohydrates should be 50-60% of total calories Protein should be approximately 15% of total calories Fiber should be at least 20-30 grams a day ***Increased fiber may help lower LDL Total Cholesterol should be < 200mg /day Consider adding plant stanol/sterols to diet (example: Benacol spread) ***A higher intake of unsaturated fat may reduce Triglycerides and Increase HDL    Adjunctive Measures (may lower LIPIDS and reduce risk of Heart Attack) include: Aerobic Exercise (20-30 minutes 3-4 times a week) Limit Alcohol Consumption Weight Reduction Aspirin 75-81 mg a day by mouth (if not allergic or contraindicated) Dietary Fiber 20-30 grams a day by mouth     Current Medications: 1)    Advair Diskus 100-50 Mcg/dose Misc (Fluticasone-salmeterol) .... Once daily 2)    Proair Hfa 108 (90 Base) Mcg/act Aers (Albuterol sulfate) .... Inhale 2 puff as directed four times a day 3)    Lisinopril 20 Mg  Tabs (Lisinopril)  .[name].. 1 by mouth once daily 4)    Mevacor 40 Mg  Tabs (Lovastatin) .[name].. 1 by mouth once daily 5)    Metamucil 30.9 % Powd (Psyllium) .... Once daily 6)    Multivital  Chew (Multiple vitamins-minerals) .... Once daily 7)    Vitamin D 1000 Unit  Tabs (Cholecalciferol) 8)    Fish Oil   Oil (Fish oil) 9)    Omega-3 350 Mg Caps (Omega-3 fatty acids) 10)    Cetirizine Hcl 10 Mg Tabs (Cetirizine hcl) 11)    Losartan Potassium 100 Mg Tabs (Losartan potassium) .... Take 1/2 to 1 by mouth once daily for high blood pressure  If you have any questions, please call. We appreciate being able to work with you.   Sincerely,    [facility]

## 2010-04-30 NOTE — Assessment & Plan Note (Signed)
Summary: sore throat/dm   Vital Signs:  Patient profile:   60 year old female Menstrual status:  postmenopausal Weight:      297 pounds BMI:     45.32 Temp:     98.7 degrees F oral BP sitting:   126 / 80  (left arm) Cuff size:   large  Vitals Entered By: Raechel Ache, RN (October 20, 2008 11:07 AM)  Primary Care Provider:  Berniece Andreas, MD  CC:  C/o sore throat x 3 days- worsening.[name]  History of Present Illness: 60 year old patient with a 3-day history of sore throatshe has had minimal nonproductive cough.  She denies any fever or strep exposure.  She does have a history of asthma, which has been stable denies any shortness of breath or wheezing.  history of hypertension, which has been stable  Allergies: No Known Drug Allergies  Past History:  Past Medical History: Reviewed history from 03/28/2008 and no changes required. HT  Obesity Fibroids Edema Asthma     Review of Systems  The patient denies anorexia, fever, weight loss, weight gain, vision loss, decreased hearing, hoarseness, chest pain, syncope, dyspnea on exertion, peripheral edema, prolonged cough, headaches, hemoptysis, abdominal pain, melena, hematochezia, severe indigestion/heartburn, hematuria, incontinence, genital sores, muscle weakness, suspicious skin lesions, transient blindness, difficulty walking, depression, unusual weight change, abnormal bleeding, enlarged lymph nodes, angioedema, and breast masses.    Physical Exam  General:  overweight-appearing.   Head:  Normocephalic and atraumatic without obvious abnormalities. No apparent alopecia or balding. Eyes:  No corneal or conjunctival inflammation noted. EOMI. Perrla. Funduscopic exam benign, without hemorrhages, exudates or papilledema. Vision grossly normal. Ears:  External ear exam shows no significant lesions or deformities.  Otoscopic examination reveals clear canals, tympanic membranes are intact bilaterally without bulging, retraction,  inflammation or discharge. Hearing is grossly normal bilaterally. Mouth:  pharyngeal erythema.   Neck:  No deformities, masses, or tenderness noted. no adenopathy Lungs:  Normal respiratory effort, chest expands symmetrically. Lungs are clear to auscultation, no crackles or wheezes. Heart:  Normal rate and regular rhythm. S1 and S2 normal without gallop, murmur, click, rub or other extra sounds.   Impression & Recommendations:  Problem # 1:  HYPERTENSION (ICD-401.9)  Her updated medication list for this problem includes:    Lisinopril 20 Mg Tabs (Lisinopril) .[name]... 1 by mouth once daily  Problem # 2:  PHARYNGITIS-ACUTE (ICD-462)  Complete Medication List: 1)  Advair Diskus 100-50 Mcg/dose Misc (Fluticasone-salmeterol) .... Once daily 2)  Proair Hfa 108 (90 Base) Mcg/act Aers (Albuterol sulfate) .... Inhale 2 puff as directed four times a day 3)  Lisinopril 20 Mg Tabs (Lisinopril) .[name].. 1 by mouth once daily 4)  Glucosamine-vitamin D 1000-200 Mg-unit Tabs (Glucosamine-vitamin d) 5)  Mevacor 40 Mg Tabs (Lovastatin) .[name].. 1 by mouth once daily 6)  B-100 Complex Tabs (Vitamins-lipotropics) 7)  Metamucil 30.9 % Powd (Psyllium) .... Once daily 8)  Multivital Chew (Multiple vitamins-minerals) .... Once daily  Other Orders: Rapid Strep (59563)  Patient Instructions: 1)  Get plenty of rest, drink lots of clear liquids, and use Tylenol for fever and comfort. Return in 7-10 days if you're not better:sooner if you're feeling worse.  Laboratory Results    Other Tests  Rapid Strep: negative Comments: Rita Ohara  October 20, 2008 11:23 AM

## 2010-04-30 NOTE — Assessment & Plan Note (Signed)
Summary: blood pressure high/mhf   Vital Signs:  Patient Profile:   60 Years Old Female Weight:      298 pounds Pulse rate:   78 / minute BP sitting:   150 / 84  (left arm) Cuff size:   large  Vitals Entered By: Romualdo Bolk, CMA (March 05, 2007 2:03 PM)                 Chief Complaint:  Follow up on BP.  History of Present Illness: Amy Stokes comes in today for feeling of elevated hr last pm and noted 100 pulse range  and bp 156/84 and 148/81  . Bothered her sleep.  No new meds.  Did have a bit more caffiene that day and 1 glass of wine. No CP or SOB  No hx of same.    Feels fine today  No change with advair. Asthma ok . Last BP check last month was 147/80  and then 135/82. Is on 1/2 lisinopril once daily .   Last labs "a while ago"  Hypertension History:      She denies headache, dyspnea with exertion, orthopnea, peripheral edema, visual symptoms, neurologic problems, and syncope.        Positive major cardiovascular risk factors include female age 76 years old or older and hypertension.  Negative major cardiovascular risk factors include non-tobacco-user status.     Current Allergies (reviewed today): No known allergies   Past Medical History:    Reviewed history from 12/28/2006 and no changes required:       HT        Obesity       Fibroids       Edema       Asthma       Hyperglycemia   Family History:    Reviewed history from 12/28/2006 and no changes required:       Family History of Alcoholism/Addiction       Family History of Arthritis       Family History Other cancer       Family History of Cardiovascular disorder       Family History of Neurological disorder  Social History:    Reviewed history from 12/28/2006 and no changes required:       Occupation:       Single       Never Smoked       Alcohol use-ocassiona       Drug use-no       Regular exercise-no    Review of Systems       see hpi   Physical Exam  General:  Well-developed,well-nourished,in no acute distress; alert,appropriate and cooperative throughout examination Head:     normocephalic and atraumatic.   Neck:     No deformities, masses, or tenderness noted. Lungs:     Normal respiratory effort, chest expands symmetrically. Lungs are clear to auscultation, no crackles or wheezes. Heart:     Normal rate and regular rhythm. S1 and S2 normal without gallop, murmur, click, rub or other extra sounds. Abdomen:     Bowel sounds positive,abdomen soft and non-tender without masses, organomegaly or hernias noted. 3cm umbi hernia flat Pulses:     present Neurologic:     non focal Skin:     turgor normal and color normal.   Cervical Nodes:     No lymphadenopathy noted Additional Exam:     EKG NSR   no acute changes.   Serial Vital  Signs/Assessments:  Time      Position  BP       Pulse  Resp  Temp     By                     142/84                         Madelin Headings MD    Impression & Recommendations:  Problem # 1:  TACHYCARDIA (ICD-785.0) probably from combo caffiene and etoh   due for labs and schedule for these before next visit. R/O metabolic etiology plan cpx labs panel with free t3 and free t4  then rov in about 1 months or as needed . Orders: EKG w/ Interpretation (93000)   Problem # 2:  HYPERTENSION (ICD-401.9) only taking 1/2 pill so go up to 1 po qd instead   and rov in 3-4 weeks( need to clarify dosing at next visit.) Her updated medication list for this problem includes:    Lisinopril 20 Mg Tabs (Lisinopril) .[name]... 1 by mouth once daily  Orders: EKG w/ Interpretation (93000)  BP today: 150/84 repeat 140's    Problem # 3:  ASTHMA (ICD-493.90) Assessment: Unchanged stable The following medications were removed from the medication list:    Albuterol 90 Mcg/act Aers (Albuterol)  Her updated medication list for this problem includes:    Advair Diskus 100-50 Mcg/dose Misc (Fluticasone-salmeterol)    Proair Hfa 108  (90 Base) Mcg/act Aers (Albuterol sulfate) ..... Inhale 2 puff as directed four times a day   Complete Medication List: 1)  Advair Diskus 100-50 Mcg/dose Misc (Fluticasone-salmeterol) 2)  Proair Hfa 108 (90 Base) Mcg/act Aers (Albuterol sulfate) .... Inhale 2 puff as directed four times a day 3)  Lisinopril 20 Mg Tabs (Lisinopril) .[name].. 1 by mouth once daily 4)  Vitamin D 1000 Unit Caps (Cholecalciferol)  Hypertension Assessment/Plan:      The patient's hypertensive risk group is category B: At least one risk factor (excluding diabetes) with no target organ damage.  Today's blood pressure is 150/84.  Her blood pressure goal is < 140/90.     ]

## 2010-04-30 NOTE — Progress Notes (Signed)
Summary: List of Blood Pressure Readings  List of Blood Pressure Readings   Imported By: Maryln Gottron 07/31/2009 13:12:57  _____________________________________________________________________  External Attachment:    Type:   Image     Comment:   External Document

## 2010-04-30 NOTE — Progress Notes (Signed)
Summary: albuteral rx   Phone Note Call from Patient Call back at Home Phone [phone]   Caller: patient live Call For: Panosh  Summary of Call: albuteral inhaler.  [facility]  Initial call taken by: Roselle Locus,  April 21, 2007 3:12 PM  Follow-up for Phone Call        Per md-ok x 1. Sent thru EMR. Follow-up by: Romualdo Bolk, CMA,  April 21, 2007 5:04 PM      Prescriptions: PROAIR HFA 108 (90 BASE) MCG/ACT AERS (ALBUTEROL SULFATE) Inhale 2 puff as directed four times a day  #1 x 0   Entered by:   Romualdo Bolk, CMA   Authorized by:   Madelin Headings MD   Signed by:   Romualdo Bolk, CMA on 04/21/2007   Method used:   Electronically sent to ...       [id number]       [address] [place], [place]  [address]       Ph: [phone]       Fax: [phone]   RxID:   [id number]

## 2010-04-30 NOTE — Progress Notes (Signed)
Summary: about rx  Phone Note Call from Patient Call back at [phone]   Caller: patient live Call For: Panosh Summary of Call: patient spoke to pharmacy about mevacor they receiced direction as 1 pill aday 20mg .   Initial call taken by: Celine Ahr,  July 28, 2007 10:19 AM  Follow-up for Phone Call        Sent thru EMR. Pt aware of this. Follow-up by: Romualdo Bolk, CMA,  July 28, 2007 10:30 AM      Prescriptions: MEVACOR 40 MG  TABS (LOVASTATIN) 1 by mouth once daily  #90 x 3   Entered by:   Romualdo Bolk, CMA   Authorized by:   Madelin Headings MD   Signed by:   Romualdo Bolk, CMA on 07/28/2007   Method used:   Electronically sent to ...       [id number]       [address] [place], [place]  [address]       Ph: [phone]       Fax: [phone]   RxID:   [id number]

## 2010-04-30 NOTE — Assessment & Plan Note (Signed)
Summary: ER visit 10/14,breathing problems,Dr Panosh pt/nn   Vital Signs:  Patient Profile:   60 Years Old Female Height:     68 inches Temp:     99.4 degrees F oral Pulse rate:   88 / minute BP sitting:   114 / 66  (left arm) Cuff size:   large  Vitals Entered By: Alfred Levins, CMA (January 13, 2008 11:18 AM)                 PCP:  Berniece Andreas, MD  Chief Complaint:  cough and low grade fever.  History of Present Illness: She was seen in ED on 01-11-08 for several days of SOB, fever, and hard dry cough. She does have asthma. That night her initial O2 was 88%, but this rose to 94% by the time she was sent home. CBC was normal. Cardiac etilogies were ruled out. Chest CT showed some bronchial inflammation but no pneumonias and no PEs. However there were some scattered thoracic lymph nodes, and a 3 month follow up scan was recommended. Also a solitary thyroid nodule was seen, and [place] follow up was recommended. She was given Prednisone and a Zpack, and today she feels a lot better. Still coughing but not as much and is mech less SOB. Still with a slight fever. Two blood cultures were obtained that night, one of which is growing some gram positive cocci in clusters.     Current Allergies: No known allergies   Past Medical History:    Reviewed history from 12/16/2007 and no changes required:       HT        Obesity       Fibroids       Edema       Asthma      Review of Systems  The patient denies anorexia, weight loss, weight gain, vision loss, decreased hearing, hoarseness, chest pain, syncope, peripheral edema, headaches, hemoptysis, abdominal pain, melena, hematochezia, severe indigestion/heartburn, hematuria, incontinence, genital sores, muscle weakness, suspicious skin lesions, transient blindness, difficulty walking, depression, unusual weight change, abnormal bleeding, enlarged lymph nodes, angioedema, breast masses, and testicular masses.     Physical  Exam  General:     overweight-appearing.  Breathing comfortably. Neck:     No deformities, masses, or tenderness noted. Lungs:     soft scattered rhonchi, no rales. Good airflow. Heart:     Normal rate and regular rhythm. S1 and S2 normal without gallop, murmur, click, rub or other extra sounds.    Impression & Recommendations:  Problem # 1:  ACUTE BRONCHITIS (ICD-466.0)  Her updated medication list for this problem includes:    Advair Diskus 100-50 Mcg/dose Misc (Fluticasone-salmeterol) .[name]..[name] Two times a day    Proair Hfa 108 (90 Base) Mcg/act Aers (Albuterol sulfate) ..... Inhale 2 puff as directed four times a day   Problem # 2:  ASTHMA (ICD-493.90)  Her updated medication list for this problem includes:    Advair Diskus 100-50 Mcg/dose Misc (Fluticasone-salmeterol) .[name]..[name] Two times a day    Proair Hfa 108 (90 Base) Mcg/act Aers (Albuterol sulfate) ..... Inhale 2 puff as directed four times a day   Problem # 3:  THYROID NODULE (ICD-241.0)  Orders: Radiology Referral (Radiology)   Complete Medication List: 1)  Advair Diskus 100-50 Mcg/dose Misc (Fluticasone-salmeterol) .... Two times a day 2)  Proair Hfa 108 (90 Base) Mcg/act Aers (Albuterol sulfate) .... Inhale 2 puff as directed four times a day 3)  Lisinopril 20 Mg  Tabs (Lisinopril) .[name].. 1 by mouth once daily 4)  Glucosamine-vitamin D 1000-200 Mg-unit Tabs (Glucosamine-vitamin d) 5)  Mevacor 40 Mg Tabs (Lovastatin) .[name].. 1 by mouth once daily 6)  B-100 Complex Tabs (Vitamins-lipotropics) 7)  Metamucil 30.9 % Powd (Psyllium) .... Once daily 8)  Multivital Chew (Multiple vitamins-minerals) .... Once daily   Patient Instructions: 1)  She seems to be improving from her bronchitis. The nodes in her thoracic cavity are likely reactive, but we will get another chest CT in 3 months to assess stability. Will set up a thyroid US to assess this nodule. We will follow the results of her blood cultures. I think this probably  represents a contaminant. It would be unlikely to look this good with a normal WBC count if she had a staphylococcal sepsis.    ]

## 2010-04-30 NOTE — Progress Notes (Signed)
Summary: Pt at [facility] waiting for Advair refill  Phone Note Call from Patient Call back at Home Phone [phone]   Caller: Patient Summary of Call: Pt has still not rcvd script for Advair. Pt is waiting at [facility] on [address] right now. Please call in asap.  Initial call taken by: Lucy Antigua,  May 08, 2009 4:25 PM  Follow-up for Phone Call        Rx sent to [facility]. Spoke to pt and she is still deciding on what to do. I did give pt a copy of the bx report Follow-up by: Romualdo Bolk, CMA (AAMA),  May 08, 2009 4:48 PM

## 2010-04-30 NOTE — Procedures (Signed)
Summary: Colonoscopy   Colonoscopy  Procedure date:  12/27/2007  Findings:      Location:  [facility].    Patient Name: Amy Stokes, Amy Stokes MRN:  Procedure Procedures: Colonoscopy CPT: [phone].  Personnel: Endoscopist: Vania Rea. Jarold Motto, MD.  Exam Location: Exam performed in Outpatient Clinic. Outpatient  Patient Consent: Procedure, Alternatives, Risks and Benefits discussed, consent obtained, from patient. Consent was obtained by the RN.  Indications Symptoms: Hematochezia.  History  Current Medications: Patient is not currently taking Coumadin.  Medical/ Surgical History: Asthma, Hyperlipidemia, Hypertension,  Pre-Exam Physical: Performed Dec 27, 2007. Entire physical exam was normal.  Comments: Pt. history reviewed/updated, physical exam performed prior to initiation of sedation? YES Exam Exam: Extent of exam reached: Cecum, extent intended: Cecum.  The cecum was identified by appendiceal orifice and IC valve. Patient position: on left side. Colon retroflexion performed. Images taken. ASA Classification: II. Tolerance: excellent.  Monitoring: Pulse and BP monitoring, Oximetry used. Supplemental O2 given. at 2 Liters.  Colon Prep Used Golytely for colon prep. Prep results: excellent.  Sedation Meds: Patient assessed and found to be appropriate for moderate (conscious) sedation. Sedation was managed by the Endoscopist. Fentanyl 75 mcg. given IV. Versed 8 mg. given IV.  Instrument(s): CF 140L. Serial [id number].  Findings - NORMAL EXAM: Cecum to Descending Colon. Not Seen: Polyps. AVM's. Colitis. Tumors. Crohn's.  - DIVERTICULOSIS: Descending Colon to Sigmoid Colon. Not bleeding. ICD9: Diverticulosis, Colon: 562.10.  - HEMORRHOIDS: Internal. Size: Medium. Not bleeding. Not thrombosed. ICD9: Hemorrhoids, Internal: 455.0.   Assessment  Diagnoses: 562.10: Diverticulosis, Colon.  455.0: Hemorrhoids, Internal.   Events  Unplanned  Interventions: No intervention was required.  Plans Medication Plan: Referring provider to order medications. Fiber supplements: Methylcellulose 1 Tbsp QAM, starting Dec 27, 2007 for indefinitely.   Patient Education: Patient given standard instructions for: Hemorrhoids. HIGH FIBER DIET.  Disposition: After procedure patient sent to recovery. After recovery patient sent home.  Scheduling/Referral: Clinic Visit, to Vania Rea. Jarold Motto, MD, around Jan 26, 2008.    cc: Berniece Andreas, MD  This report was created from the original endoscopy report, which was reviewed and signed by the above listed endoscopist.

## 2010-04-30 NOTE — Assessment & Plan Note (Signed)
Summary: 3-4 WKS ROV/NTA   Vital Signs:  Patient Profile:   60 Years Old Female Weight:      297 pounds Temp:     98.5 degrees F oral Pulse rate:   100 / minute BP sitting:   130 / 80  (left arm) Cuff size:   large  Vitals Entered By: Romualdo Bolk, CMA (April 06, 2007 8:08 AM)                 Chief Complaint:  Follow up.  History of Present Illness: Amy Stokes is here for a follow up on labs. Pt needs rx on lisinopril. See last ov.   no side effect of med.  thinks bp is better Had bad rti  at [facility] but better.  No asthma flare.   Tachycardia is resolved .     Hypertension History:      She denies headache, chest pain, palpitations, dyspnea with exertion, peripheral edema, and side effects from treatment.  She notes no problems with any antihypertensive medication side effects.  Further comments include: some puffiness if increases salt.        Positive major cardiovascular risk factors include female age 28 years old or older, hyperlipidemia, and hypertension.  Negative major cardiovascular risk factors include non-tobacco-user status.     Current Allergies (reviewed today): No known allergies   Past Medical History:    Reviewed history from 12/28/2006 and no changes required:       HT        Obesity       Fibroids       Edema       Asthma       Hyperglycemia   Serial Vital Signs/Assessments:  Time      Position  BP       Pulse  Resp  Temp     By                     128/78                         Madelin Headings MD  Comments: large By: Madelin Headings MD    Family History:    Reviewed history from 12/28/2006 and no changes required:       Family History of Alcoholism/Addiction       Family History of Arthritis       Family History Other cancer       Family History of Cardiovascular disorder       Family History of Neurological disorder  Social History:    Reviewed history from 03/05/2007 and no changes required:       Occupation:  Single       Never Smoked       Alcohol use-ocassiona       Drug use-no       Regular exercise-no    Review of Systems       see hpi   Physical Exam  General:     alert, well-developed, and well-nourished.   Neck:     No deformities, masses, or tenderness noted. Lungs:     Normal respiratory effort, chest expands symmetrically. Lungs are clear to auscultation, no crackles or wheezes. Heart:     normal rate, regular rhythm, and no murmur.   Extremities:     1+ left pedal edema and 1+ right pedal edema.   Neurologic:  intact non focal Additional Exam:     see labs and reviewed    Impression & Recommendations:  Problem # 1:  HYPERTENSION (ICD-401.9) Assessment: Improved better control    disc weight loss  continue meds and rov in 6 months or prn Her updated medication list for this problem includes:    Lisinopril 20 Mg Tabs (Lisinopril) .[name]... 1 by mouth once daily  BP today: 130/80 Prior BP: 150/84 (03/05/2007)  10 Yr Risk Heart Disease: 17 % Prior 10 Yr Risk Heart Disease: Not enough information (03/05/2007)  Labs Reviewed: Creat: 0.7 (03/26/2007) Chol: 192 (03/26/2007)   HDL: 29.9 (03/26/2007)   LDL: 145 (03/26/2007)   TG: 87 (03/26/2007)   Problem # 2:  HYPERLIPIDEMIA (ICD-272.2) Assessment: New disc and reviewed   begin meds and repeat lipids  ot/pt in 6-8 weeks     if ok rov in 6 months Her updated medication list for this problem includes:    Mevacor 20 Mg Tabs (Lovastatin) .[name]... 1 by mouth once daily  Labs Reviewed: Chol: 192 (03/26/2007)   HDL: 29.9 (03/26/2007)   LDL: 145 (03/26/2007)   TG: 87 (03/26/2007) SGOT: 16 (03/26/2007)   SGPT: 17 (03/26/2007)  10 Yr Risk Heart Disease: 17 % Prior 10 Yr Risk Heart Disease: Not enough information (03/05/2007)   Problem # 3:  TACHYCARDIA (ICD-785.0) Assessment: Improved resolved  Complete Medication List: 1)  Advair Diskus 100-50 Mcg/dose Misc (Fluticasone-salmeterol) 2)  Proair Hfa 108 (90 Base)  Mcg/act Aers (Albuterol sulfate) .... Inhale 2 puff as directed four times a day 3)  Lisinopril 20 Mg Tabs (Lisinopril) .[name].. 1 by mouth once daily 4)  Vitamin D 1000 Unit Caps (Cholecalciferol) 5)  Mevacor 20 Mg Tabs (Lovastatin) .[name].. 1 by mouth once daily  Hypertension Assessment/Plan:      The patient's hypertensive risk group is category B: At least one risk factor (excluding diabetes) with no target organ damage.  Her calculated 10 year risk of coronary heart disease is 17 %.  Today's blood pressure is 130/80.  Her blood pressure goal is < 140/90.     Prescriptions: LISINOPRIL 20 MG  TABS (LISINOPRIL) 1 by mouth once daily  #100 x 3   Entered and Authorized by:   Madelin Headings MD   Signed by:   Madelin Headings MD on 04/06/2007   Method used:   Print then Give to Patient   RxID:   [id number] MEVACOR 20 MG  TABS (LOVASTATIN) 1 by mouth once daily  #90 x 0   Entered and Authorized by:   Madelin Headings MD   Signed by:   Madelin Headings MD on 04/06/2007   Method used:   Print then Give to Patient   RxID:   [id number]  ]

## 2010-04-30 NOTE — Progress Notes (Signed)
Summary: biopsy results  Phone Note Call from Patient Call back at Home Phone [phone]   Caller: Patient Call For: Madelin Headings MD Summary of Call: pt is requesting biopsy results Initial call taken by: Heron Sabins,  February 19, 2009 12:06 PM  Follow-up for Phone Call        Pt called again to get results biopsy. Please call back asap today.  Follow-up by: Lucy Antigua,  February 20, 2009 10:11 AM  Additional Follow-up for Phone Call Additional follow up Details #1::        shannon please call her   with the notes i gave you yesterday  . Tell her we had to chase down the path report and didnt get it until yesterday and that is the reaon for the delay.  Additional Follow-up by: Madelin Headings MD,  February 20, 2009 11:57 AM    Additional Follow-up for Phone Call Additional follow up Details #2::    Spoke to pt and she wants states that she wants to think about doing the referral. Follow-up by: Romualdo Bolk, CMA [name]),  February 20, 2009 12:36 PM

## 2010-04-30 NOTE — Progress Notes (Signed)
Summary: dr fry please call  Phone Note Call from Patient Call back at Home Phone [phone]   Caller: Patient Call For: dr fry Summary of Call: pt would like dr fry to call her concerning tsh biopsy Initial call taken by: Heron Sabins,  January 26, 2008 10:20 AM  Follow-up for Phone Call        She walked in the office upset that she went to see Dr Ezzard Standing, under the impression that she was getting a biopsy and paid $140 and didn't get one.  She said someone from our office told her that she was getting a biopsy but did not know the name.  She expressed that she was not going to get the biopsy because she is a self pay pt and just wanted Dr Fabian Sharp or Dr Clent Ridges to draw routine thyroid levels.  Her sister had the exact same nodule and had hers biopsied and it came back benign.  I told the pt that we are not in control of what happens at Dr [facility] office and if we were told that it was for a biopsy and its not then she needed to take that up with his office.  I did apoligize for her inconvienence.  She did not understand why she was referred in the first place and I explained that Dr Clent Ridges just wanted a specialist to look at it.  I told her if she did not want the biopsy then I would let Dr Clent Ridges know and it would be up to him or Dr Fabian Sharp on how to handle the thyroid level labs Follow-up by: Alfred Levins, CMA,  January 26, 2008 11:33 AM  Additional Follow-up for Phone Call Additional follow up Details #1::        above noted. I will let her follow up with Dr. Fabian Sharp for this from now on.  Additional Follow-up by: Nelwyn Salisbury MD,  January 26, 2008 11:53 AM    Additional Follow-up for Phone Call Additional follow up Details #2::    call patient and tellher I saw [place] report and rec thyroid bx but I dont know the cost  of this  ..  we can refer to radiology for bx if she prefers   . Follow-up by: Madelin Headings MD,  February 01, 2008 6:06 PM

## 2010-04-30 NOTE — Assessment & Plan Note (Signed)
Summary: med check/mhf   Vital Signs:  Patient Profile:   60 Years Old Female Height:     68 inches Weight:      296 pounds Temp:     97.9 degrees F oral Pulse rate:   100 / minute Pulse rhythm:   regular BP sitting:   128 / 68  (left arm) Cuff size:   large  Vitals Entered By: Tomma Lightning, RN                 PCP:  Berniece Andreas, MD  Chief Complaint:  Needs meds refilled.[name]  History of Present Illness: Amy Stokes comes in today for med check. Problems are :   Has heel spur left heel. GTreating conservatively. BP has been 124 /60 range.  no concerns  LIPid med  no se of med s. Asthma  : ran out of advair pre x [facility] and now ok   was on two times a day dosing and ok so far.  Thyroid nodule   didnt get bx cost a factor  see [place] 3.2 cm complex cyst . has no signs .  want to follow for now as it cost 1500$ to get bx.  See last notes and er visit     Current Allergies: No known allergies   Past Medical History:    HT     Obesity    Fibroids    Edema    Asthma      Family History:    Family History of Alcoholism/Addiction: Uncle    Family History of Arthritis: Mother    Family History Other cancer    Family History of Cardiovascular disorder: Father    Family History of Neurological disorder: Mother    neg colon cancer or bowel diseases      Family History of Pancreatic Cancer:Mother:    Family History of Heart Disease: Brother x2    Thyroid disease: Sister  ? nodules   Social History:    Occupation:Admin Geophysicist/field seismologist    Single    Never Smoked    Alcohol use-ocassional    Drug use-no    Regular exercise-no             Review of Systems  The patient denies anorexia, fever, weight loss, chest pain, syncope, dyspnea on exertion, prolonged cough, abdominal pain, melena, hematochezia, severe indigestion/heartburn, abnormal bleeding, enlarged lymph nodes, and angioedema.         legs have less edema    Physical Exam  General:  Well-developed,well-nourished,in no acute distress; alert,appropriate and cooperative throughout examination Head:     normocephalic, atraumatic, and no abnormalities observed.   Eyes:     vision grossly intact, pupils equal, and pupils round.   Ears:     R ear normal and L ear normal.   Nose:     no external deformity and no nasal discharge.   Mouth:     pharynx pink and moist.   Neck:     No deformities, , or tenderness noted. some fullness low thyroid left neck non tender  hard for me to delineate Lungs:     Normal respiratory effort, chest expands symmetrically. Lungs are clear to auscultation, no crackles or wheezes. Heart:     Normal rate and regular rhythm. S1 and S2 normal without gallop, murmur, click, rub or other extra sounds. Abdomen:     Bowel sounds positive,abdomen soft and non-tender without masses, organomegaly or  noted.umbilical hernia.non tender 2 cm  Extremities:     1 edema with chronic changes and no  acute ulcers  or lesions Skin:     turgor normal and color normal.   Cervical Nodes:     No lymphadenopathy noted Psych:     Oriented X3, good eye contact, and not anxious appearing.   Additional Exam:     see last [place]     Impression & Recommendations:  Problem # 1:  THYROID NODULE (ICD-241.0) complex and dominant  cost is limiting facto.  Reviewed reasons for b x incuding r/o malignancy  Problem # 2:  HYPERLIPIDEMIA (ICD-272.2)  Her updated medication list for this problem includes:    Mevacor 40 Mg Tabs (Lovastatin) .[name]... 1 by mouth once daily  Labs Reviewed: Chol: 144 (07/26/2007)   HDL: 32.8 (07/26/2007)   LDL: 99 (07/26/2007)   TG: 61 (07/26/2007) SGOT: 19 (07/26/2007)   SGPT: 17 (07/26/2007)  Prior 10 Yr Risk Heart Disease: 17 % (04/06/2007)   Problem # 3:  HYPERTENSION (ICD-401.9) stableHer updated medication list for this problem includes:    Lisinopril 20 Mg Tabs (Lisinopril) .[name]... 1 by mouth once daily  BP today: 128/68 Prior BP:  114/66 (01/13/2008)  Prior 10 Yr Risk Heart Disease: 17 % (04/06/2007)  Labs Reviewed: Creat: 0.7 (03/26/2007) Chol: 144 (07/26/2007)   HDL: 32.8 (07/26/2007)   LDL: 99 (07/26/2007)   TG: 61 (07/26/2007)  Her updated medication list for this problem includes:    Lisinopril 20 Mg Tabs (Lisinopril) .[name]... 1 by mouth once daily   Problem # 4:  ASTHMA (ICD-493.90) stable so far  needs to get back on controller meds  Her updated medication list for this problem includes:    Advair Diskus 100-50 Mcg/dose Misc (Fluticasone-salmeterol) .[name]..[name] Two times a day    Proair Hfa 108 (90 Base) Mcg/act Aers (Albuterol sulfate) ..... Inhale 2 puff as directed four times a day   Problem # 5:  UMBILICAL HERNIA (ICD-553.1) Assessment: Comment Only  Complete Medication List: 1)  Advair Diskus 100-50 Mcg/dose Misc (Fluticasone-salmeterol) .... Two times a day 2)  Proair Hfa 108 (90 Base) Mcg/act Aers (Albuterol sulfate) .... Inhale 2 puff as directed four times a day 3)  Lisinopril 20 Mg Tabs (Lisinopril) .[name].. 1 by mouth once daily 4)  Glucosamine-vitamin D 1000-200 Mg-unit Tabs (Glucosamine-vitamin d) 5)  Mevacor 40 Mg Tabs (Lovastatin) .[name].. 1 by mouth once daily 6)  B-100 Complex Tabs (Vitamins-lipotropics) 7)  Metamucil 30.9 % Powd (Psyllium) .... Once daily 8)  Multivital Chew (Multiple vitamins-minerals) .... Once daily   Patient Instructions: 1)  rec lipids and tsh and bmp lfts   in February or march for medical monitoring.  2)  otherwise rov in 6 months  3)  call when able to do thyroid biopsy.   Prescriptions: PROAIR HFA 108 (90 BASE) MCG/ACT AERS (ALBUTEROL SULFATE) Inhale 2 puff as directed four times a day  #1 x 2   Entered and Authorized by:   Madelin Headings MD   Signed by:   Madelin Headings MD on 03/28/2008   Method used:   Print then Give to Patient   RxID:   [id number] MEVACOR 40 MG  TABS (LOVASTATIN) 1 by mouth once daily  #90 x 1   Entered and Authorized by:   Madelin Headings MD   Signed by:   Madelin Headings MD on 03/28/2008   Method used:   Print then Give to Patient   RxID:   [id number] LISINOPRIL 20  MG  TABS (LISINOPRIL) 1 by mouth once daily  #100 x 3   Entered and Authorized by:   Madelin Headings MD   Signed by:   Madelin Headings MD on 03/28/2008   Method used:   Print then Give to Patient   RxID:   [id number] ADVAIR DISKUS 100-50 MCG/DOSE MISC (FLUTICASONE-SALMETEROL) two times a day  #1 x 12   Entered and Authorized by:   Madelin Headings MD   Signed by:   Madelin Headings MD on 03/28/2008   Method used:   Print then Give to Patient   RxID:   [id number]  ]

## 2010-05-02 NOTE — Progress Notes (Signed)
Summary: Rash with losaratan  Phone Note Call from Patient Call back at Home Phone [phone]   Caller: Patient Summary of Call: Pt has a rash on chest and arm. She has had it for a few months now. It is on the left arm on the underside and elbow, a patch near her shoulder. It does itch sometimes but not alot. Tried using a fungal medication otc, excezema. Nothing making a difference. She noticed it progessing when starting losaratan. Pt hasn't done anything different. Pt hasn't changed anything in the household. Initial call taken by: Romualdo Bolk, CMA [name]),  March 13, 2010 11:13 AM  Follow-up for Phone Call        i am confused  she didnt mention this at her last ov and  didnt see any rashes  during her exam.   if there are hives  and swelling she should stop  the med.  she can try off   medication to see what happens to the rash but doesnt sound like drug rash... If she can email pix l she can  email pictures  to shannon and i can look at this and make recs .   Follow-up by: Madelin Headings MD,  March 21, 2010 12:29 PM  Additional Follow-up for Phone Call Additional follow up Details #1::        Pt aware and will email pictures for [place] to see. No swelling or hives. Pt is also going to d/c her medications too. Additional Follow-up by: Romualdo Bolk, CMA [name]),  March 21, 2010 3:11 PM    Additional Follow-up for Phone Call Additional follow up Details #2::    The severity of the rash has subsided a lot since I last spoke with you. It has almost gone away. At one point it was on my inner forearm and upper arm and by my armpit on the left and by my armpit on the right and the bend of both elbows.  If it flairs up again I will consider suspending the blood pressure medication temporally to see if it is the cause.  Pt emailed pictures of rash Follow-up by: Romualdo Bolk, CMA [name]),  March 21, 2010 3:57 PM  Additional Follow-up for Phone Call Additional  follow up Details #3:: Details for Additional Follow-up Action Taken: hold on to iimage  and will view when back in office  Additional Follow-up by: Madelin Headings MD,  March 22, 2010 10:40 AM

## 2010-05-02 NOTE — Progress Notes (Signed)
Summary: List of Blood Pressures taken at home  List of Blood Pressures taken at home   Imported By: Maryln Gottron 03/14/2010 08:51:15  _____________________________________________________________________  External Attachment:    Type:   Image     Comment:   External Document

## 2010-05-02 NOTE — Assessment & Plan Note (Signed)
Summary: pt in trial study for chole/urine has blood/white cells/njr   Vital Signs:  Patient profile:   60 year old female Menstrual status:  postmenopausal Height:      68 inches Weight:      309 pounds BMI:     47.15 Pulse rate:   105 / minute BP sitting:   150 / 70  (left arm) Cuff size:   large  Vitals Entered By: Romualdo Bolk, CMA (AAMA) (January 31, 2010 9:34 AM) CC: Follow-up visit on labs done in clinical trial, discuss getting a pneumonia vaccine   History of Present Illness: Amy Stokes comes in today  for acute sda for above problem .  Since her last visit she still does not have health insurance. Over she has been enrolled in a number of  studies.  the most recent one the just ended was for her asthma. She was on a trial of high-dose Advair 500/50 which significantly helped her pulmonary symptoms but she does not have access to the medication now. The lower dose of Advair 100 costs over $100 for her and she cannot afford it. She brings given a copy of some pulmonary function reports and allergy testing. The shows allergy to cockroaches dust mites.   Now is  to enroll in  a lipid study .   that will include Lipitor and study out. She will get laboratory studies done with this. during screening it was noted that she had some red cells and white cells in her urine. She has no UTI symptoms  but would like her urinalysis rechecked. She reports a copy of her blood pressure readings in the last six months they range from 142/72 to 130/80. She has had an occasionalirreg HB   but no chest pain increased shortness of breath and none of this is recent. she's interested in a pneumonia shot but may get a flu shot elsewhere for cost.    Preventive Screening-Counseling & Management  Alcohol-Tobacco     Alcohol drinks/day: <1     Smoking Status: quit  Caffeine-Diet-Exercise     Caffeine use/day: 1-2 cups a day      Does Patient Exercise: no  Current Medications  (verified): 1)  Advair Diskus 100-50 Mcg/dose Misc (Fluticasone-Salmeterol) .[name].. 1 Two Times A Day 2)  Proair Hfa 108 (90 Base) Mcg/act Aers (Albuterol Sulfate) .... Inhale 2 Puff As Directed Four Times A Day 3)  Mevacor 40 Mg  Tabs (Lovastatin) .[name].. 1 By Mouth Once Daily 4)  Fiber 625 Mg Tabs (Calcium Polycarbophil) 5)  Multivital  Chew (Multiple Vitamins-Minerals) .... Once Daily 6)  Vitamin D 1000 Unit  Tabs (Cholecalciferol) 7)  Fish Oil   Oil (Fish Oil) 8)  Omega-3 350 Mg Caps (Omega-3 Fatty Acids) 9)  Losartan Potassium 100 Mg Tabs (Losartan Potassium) .... Take 1/2 To 1 By Mouth Once Daily For High Blood Pressure  Allergies (verified): No Known Drug Allergies  Past History:  Past medical, surgical, family and social histories (including risk factors) reviewed, and no changes noted (except as noted below).  Past Medical History: HT  Obesity Fibroids Edema Asthma    Thyroid nodule left discovered on chest ct  : Needle  bx" indeterminant " cannot r/o follicular neoplasm)   Nov 2010 Hyperlipidemia   Past Surgical History: Reviewed history from 07/16/2009 and no changes required. Denies surgical history thyroid needle bx.     Past History:  Care Management: None Current  Family History: Reviewed history from 03/28/2008 and  no changes required. Family History of Alcoholism/Addiction: Uncle Family History of Arthritis: Mother Family History Other cancer Family History of Cardiovascular disorder: Father Family History of Neurological disorder: Mother neg colon cancer or bowel diseases   Family History of Pancreatic Cancer:Mother: Family History of Heart Disease: Brother x2 Thyroid disease: Sister  ? nodules   Social History: Reviewed history from 07/16/2009 and no changes required. Occupation:Admin assistant  currently unemployed  Married   Never Smoked Alcohol use-ocassional Drug use-no Regular exercise-no    hh of 2    pet cat.   Review of Systems  The  patient denies anorexia, fever, weight loss, vision loss, chest pain, syncope, prolonged cough, melena, hematochezia, severe indigestion/heartburn, muscle weakness, enlarged lymph nodes, and angioedema.    Physical Exam  General:  alert, well-developed, well-nourished, and well-hydrated.   Head:  normocephalic and atraumatic.   Eyes:  vision grossly intact.   Neck:  No deformities, masses, or tenderness noted. Lungs:  Normal respiratory effort, chest expands symmetrically. Lungs are clear to auscultation, no crackles or wheezes. Heart:  Normal rate and regular rhythm. S1 and S2 normal without gallop, murmur, click, rub or other extra sounds. Pulses:  no clubbing cyanosis or edema  Neurologic:  alert & oriented X3 and strength normal in all extremities.  non focal  Skin:  turgor normal, color normal, no ecchymoses, and no petechiae.   Cervical Nodes:  No lymphadenopathy noted Psych:  Oriented X3, normally interactive, good eye contact, not anxious appearing, and not depressed appearing.   information reviewed.  Impression & Recommendations:  Problem # 1:  ASTHMA (ICD-493.90) problematic apparently respond to test medication but cannot afford the medication. Given her samples of Dulera  and she can try this two boxes.  avoidance precautions also.  Call if helpful or more samples.  agree with Pneumovax today and to get a flu shot here or elsewhere Her updated medication list for this problem includes:    Advair Diskus 100-50 Mcg/dose Misc (Fluticasone-salmeterol) .[name]... 1 two times a day    Proair Hfa 108 (90 Base) Mcg/act Aers (Albuterol sulfate) ..... Inhale 2 puff as directed four times a day    Dulera 200-5 Mcg/act Aero (Mometasone furo-formoterol fum) .[name]... 1-2 puffs two times a day  Problem # 2:  MORBID OBESITY (ICD-278.01) contributing to some of her health risk Ht: 68 (01/31/2010)   Wt: 309 (01/31/2010)   BMI: 47.15 (01/31/2010)  Problem # 3:  HYPERLIPIDEMIA (ICD-272.2) will be  changing medications soon  for clinical  study Her updated medication list for this problem includes:    Mevacor 40 Mg Tabs (Lovastatin) .[name]... 1 by mouth once daily  Problem # 4:  HYPERTENSION (ICD-401.9) slightly up today continue to monitor. She's due for lab test but she should get these through the lipid study hopefully and send [place] a copy The following medications were removed from the medication list:    Lisinopril 20 Mg Tabs (Lisinopril) .[name]... 1 by mouth once daily Her updated medication list for this problem includes:    Losartan Potassium 100 Mg Tabs (Losartan potassium) .[name]... Take 1/2 to 1 by mouth once daily for high blood pressure  Problem # 5:  URINALYSIS, ABNORMAL (ICD-791.9) normal today  Problem # 6:  ALLERGY (ICD-995.3) by skin testing to cockroach dust mites  Complete Medication List: 1)  Advair Diskus 100-50 Mcg/dose Misc (Fluticasone-salmeterol) .[name].. 1 two times a day 2)  Proair Hfa 108 (90 Base) Mcg/act Aers (Albuterol sulfate) .... Inhale 2 puff as directed  four times a day 3)  Mevacor 40 Mg Tabs (Lovastatin) .[name].. 1 by mouth once daily 4)  Fiber 625 Mg Tabs (Calcium polycarbophil) 5)  Multivital Chew (Multiple vitamins-minerals) .... Once daily 6)  Vitamin D 1000 Unit Tabs (Cholecalciferol) 7)  Fish Oil Oil (Fish oil) 8)  Omega-3 350 Mg Caps (Omega-3 fatty acids) 9)  Losartan Potassium 100 Mg Tabs (Losartan potassium) .... Take 1/2 to 1 by mouth once daily for high blood pressure 10)  Dulera 200-5 Mcg/act Aero (Mometasone furo-formoterol fum) .[name].. 1-2 puffs two times a day  Other Orders: UA Dipstick w/o Micro (automated)  (81003) Pneumococcal Vaccine (16109) Admin 1st Vaccine (60454)  Patient Instructions: 1)  Ok to   do lipid trial 2)  Ok to get  flu shot . 3)  Agree with pneumonia shot.  4)  And  can price the Tdap.    here or poss the health department   Orders Added: 1)  UA Dipstick w/o Micro (automated)  [81003] 2)  Pneumococcal Vaccine [90732] 3)   Admin 1st Vaccine [90471] 4)  Est. Patient Level IV [09811]   Immunizations Administered:  Pneumonia Vaccine:    Vaccine Type: Pneumovax    Site: right deltoid    Mfr: Merck    Dose: 0.5 ml    Route: IM    Given by: Romualdo Bolk, CMA (AAMA)    Exp. Date: 07/25/2011    Lot #: [id number]   Immunizations Administered:  Pneumonia Vaccine:    Vaccine Type: Pneumovax    Site: right deltoid    Mfr: Merck    Dose: 0.5 ml    Route: IM    Given by: Romualdo Bolk, CMA (AAMA)    Exp. Date: 07/25/2011    Lot #: [id number]   Laboratory Results   Urine Tests  Date/Time Recieved: January 31, 2010 10:43 AM  Date/Time Reported: January 31, 2010 10:43 AM   Routine Urinalysis   Color: yellow Appearance: Clear Glucose: negative   (Normal Range: Negative) Bilirubin: negative   (Normal Range: Negative) Ketone: negative   (Normal Range: Negative) Spec. Gravity: 1.010   (Normal Range: 1.003-1.035) Blood: negative   (Normal Range: Negative) pH: 7.0   (Normal Range: 5.0-8.0) Protein: negative   (Normal Range: Negative) Urobilinogen: 0.2   (Normal Range: 0-1) Nitrite: negative   (Normal Range: Negative) Leukocyte Esterace: negative   (Normal Range: Negative)    Comments: Wynona Canes, CMA  January 31, 2010 10:43 AM     Appended Document: pt in trial study for chole/urine has blood/white cells/njr Tell patient that i looked at her labs and no concerns. However   as she knows  she should have her thyroid  nodule  rechecked     at least  another [place]    her biopsy could not  be sure  not a  neoplasm.    Whatdoes she wish [place] to do about this?   Appended Document: pt in trial study for chole/urine has blood/white cells/njr Pt aware of this and can't afford to get another [place] at this time.

## 2010-05-02 NOTE — Progress Notes (Signed)
Summary: Pt called re: req for Lovastatin. Pls call back.   Phone Note Call from Patient Call back at [facility] Phone [phone]   Caller: Patient Summary of Call: Pt called re: refill of Lovastatin. Pt was told by pharmacist that med was denied and she needed to contact doctor. Pls call asap.  Initial call taken by: Lucy Antigua,  April 03, 2010 2:29 PM  Follow-up for Phone Call        Spoke to pt and she didn't have a problem with the lovastatin. Pt hasn't d/c lorsortan but she is going to try go off it and see if the rash goes away. She going to monitor her bp and let us know how her bp and rash is doing. Rx sent to pharmacy. Follow-up by: Romualdo Bolk, CMA (AAMA),  April 03, 2010 3:08 PM    Prescriptions: MEVACOR 40 MG  TABS (LOVASTATIN) 1 by mouth once daily  #90 Tablet x 1   Entered by:   Romualdo Bolk, CMA (AAMA)   Authorized by:   Madelin Headings MD   Signed by:   Romualdo Bolk, CMA (AAMA) on 04/03/2010   Method used:   Electronically to        [id number] (retail)       [address] [place], [place]  [address]       Ph: [phone]       Fax: [phone]   RxID:   [id number]

## 2010-05-02 NOTE — Miscellaneous (Signed)
Summary: Skin Tests/[facility]  Skin Tests/[facility]   Imported By: Maryln Gottron 03/14/2010 08:56:45  _____________________________________________________________________  External Attachment:    Type:   Image     Comment:   External Document

## 2010-05-21 ENCOUNTER — Ambulatory Visit: Payer: Self-pay | Admitting: Obstetrics and Gynecology

## 2010-05-21 ENCOUNTER — Other Ambulatory Visit: Payer: Self-pay

## 2010-05-21 ENCOUNTER — Ambulatory Visit (INDEPENDENT_AMBULATORY_CARE_PROVIDER_SITE_OTHER): Payer: Self-pay | Admitting: Obstetrics and Gynecology

## 2010-05-21 DIAGNOSIS — D259 Leiomyoma of uterus, unspecified: Secondary | ICD-10-CM

## 2010-05-21 DIAGNOSIS — N950 Postmenopausal bleeding: Secondary | ICD-10-CM

## 2010-05-29 ENCOUNTER — Telehealth: Payer: Self-pay | Admitting: *Deleted

## 2010-05-29 MED ORDER — FLUTICASONE-SALMETEROL 100-50 MCG/DOSE IN AEPB: 1.0000 | INHALATION_SPRAY | Freq: Two times a day (BID) | RESPIRATORY_TRACT | Status: DC

## 2010-05-29 NOTE — Telephone Encounter (Signed)
rx sent to pharmacy

## 2010-05-31 ENCOUNTER — Telehealth: Payer: Self-pay | Admitting: Internal Medicine

## 2010-05-31 NOTE — Telephone Encounter (Signed)
Pt called to request a refill on Advair.Marland KitchenMarland KitchenMarland KitchenPt currently out of med.... [facility]... Pts # [phone]

## 2010-05-31 NOTE — Telephone Encounter (Signed)
Already taken care of

## 2010-07-04 ENCOUNTER — Other Ambulatory Visit: Payer: Self-pay | Admitting: Obstetrics and Gynecology

## 2010-07-15 ENCOUNTER — Telehealth: Payer: Self-pay | Admitting: *Deleted

## 2010-07-15 NOTE — Telephone Encounter (Signed)
Pt had a recent chol screening, and is worried about total being too low. Total Chol:  115 HDL:             39 LDL:            180 Trig:             168  Pt wants to know if she should be concerned about her levels??

## 2010-07-16 NOTE — Telephone Encounter (Signed)
The numbers she reported don't make any sense is that total cholesterol 180 or 115.Send [place] a copy of her labs  As i dont see this in the ehr.   Lower is usually better .

## 2010-07-17 ENCOUNTER — Telehealth: Payer: Self-pay | Admitting: *Deleted

## 2010-07-17 NOTE — Telephone Encounter (Signed)
Pt will drop off a copy of her screening labs for Dr. Fabian Sharp to look at.

## 2010-07-17 NOTE — Telephone Encounter (Signed)
ERROR

## 2010-08-08 ENCOUNTER — Telehealth: Payer: Self-pay | Admitting: Internal Medicine

## 2010-08-08 NOTE — Telephone Encounter (Signed)
Please up date her med list as there is no lipid med  listed on the med list.    Surely if her readings  Are this  low and  She is not diabetic or having established vascular disease  then she can stop the med and recheck levels when convenient (or least expensive ) when off med.

## 2010-08-08 NOTE — Telephone Encounter (Signed)
Patient would like to know if she needs to continue to take her cholesterol medication.  Recently her total cholesterol level has dropped to 115.  Please call patient and advise

## 2010-08-09 ENCOUNTER — Encounter: Payer: Self-pay | Admitting: Internal Medicine

## 2010-08-09 NOTE — Telephone Encounter (Signed)
Left message on machine about below.

## 2010-12-31 LAB — POCT CARDIAC MARKERS
CKMB, poc: <1 — ABNORMAL LOW
Myoglobin, poc: 109
Troponin i, poc: <0.05

## 2010-12-31 LAB — BASIC METABOLIC PANEL
BUN: 5 — ABNORMAL LOW
CO2: 26
Calcium: 8.8
Chloride: 101
Creatinine, Ser: 0.62
GFR calc Af Amer: >60
GFR calc non Af Amer: >60
Glucose, Bld: 129 — ABNORMAL HIGH
Potassium: 3.7
Sodium: 137

## 2010-12-31 LAB — CULTURE, BLOOD (ROUTINE X 2): Culture: NO GROWTH

## 2010-12-31 LAB — DIFFERENTIAL
Basophils Absolute: 0
Basophils Relative: 0
Eosinophils Absolute: 0.1
Eosinophils Relative: 1
Lymphocytes Relative: 4 — ABNORMAL LOW
Lymphs Abs: 0.2 — ABNORMAL LOW
Monocytes Absolute: 0.6
Monocytes Relative: 11
Neutro Abs: 4.4
Neutrophils Relative %: 84 — ABNORMAL HIGH

## 2010-12-31 LAB — CBC
HCT: 36.3
Hemoglobin: 11.8 — ABNORMAL LOW
MCHC: 32.6
MCV: 82
Platelets: 235
RBC: 4.43
RDW: 14.9
WBC: 5.3

## 2011-02-13 ENCOUNTER — Telehealth: Payer: Self-pay | Admitting: Internal Medicine

## 2011-02-13 NOTE — Telephone Encounter (Signed)
Would like a written prescription for Advair if possible as she does not have insurance or a pharmacy to use at the moment

## 2011-02-14 MED ORDER — FLUTICASONE-SALMETEROL 100-50 MCG/DOSE IN AEPB: 1.0000 | INHALATION_SPRAY | Freq: Two times a day (BID) | RESPIRATORY_TRACT | Status: DC

## 2011-02-14 NOTE — Telephone Encounter (Signed)
Pt needs to schedule a follow up appt before next refill. Pt aware that we will rx ready am.

## 2011-02-26 ENCOUNTER — Telehealth: Payer: Self-pay | Admitting: Internal Medicine

## 2011-02-26 NOTE — Telephone Encounter (Signed)
Refill Albuterol to [facility]. Thanks.

## 2011-02-28 MED ORDER — ALBUTEROL SULFATE HFA 108 (90 BASE) MCG/ACT IN AERS: 2.0000 | INHALATION_SPRAY | Freq: Four times a day (QID) | RESPIRATORY_TRACT | Status: DC | PRN

## 2011-02-28 NOTE — Telephone Encounter (Signed)
Rx called in. Pt aware of this.

## 2011-02-28 NOTE — Telephone Encounter (Signed)
Pt is out of albuterol.

## 2011-02-28 NOTE — Telephone Encounter (Signed)
Pt called again about inhaler. Pt is very concerned because she is completely out and does not have a back up inhaler

## 2011-03-10 ENCOUNTER — Telehealth: Payer: Self-pay | Admitting: Internal Medicine

## 2011-03-10 NOTE — Telephone Encounter (Signed)
Pt need med check up. Pt has asthma requesting ov before 03-28-11

## 2011-03-12 MED ORDER — FLUTICASONE-SALMETEROL 100-50 MCG/DOSE IN AEPB: 1.0000 | INHALATION_SPRAY | Freq: Two times a day (BID) | RESPIRATORY_TRACT | Status: DC

## 2011-03-12 NOTE — Telephone Encounter (Signed)
Pt would like rx advair 100-50 mg#3 fax to [place] [phone]

## 2011-03-12 NOTE — Telephone Encounter (Signed)
Can do the first week in Jan.

## 2011-03-12 NOTE — Telephone Encounter (Signed)
Pt is sch 04-03-2011 pt will bring blood work results

## 2011-03-12 NOTE — Telephone Encounter (Signed)
Rx sent to pharmacy. Please advise where we can work this pt in for an appt.

## 2011-04-03 ENCOUNTER — Ambulatory Visit (INDEPENDENT_AMBULATORY_CARE_PROVIDER_SITE_OTHER): Payer: Self-pay | Admitting: Internal Medicine

## 2011-04-03 ENCOUNTER — Encounter: Payer: Self-pay | Admitting: Internal Medicine

## 2011-04-03 VITALS — BP 130/80 | HR 88 | Wt 272.0 lb

## 2011-04-03 DIAGNOSIS — E782 Mixed hyperlipidemia: Secondary | ICD-10-CM

## 2011-04-03 DIAGNOSIS — R748 Abnormal levels of other serum enzymes: Secondary | ICD-10-CM | POA: Insufficient documentation

## 2011-04-03 DIAGNOSIS — I10 Essential (primary) hypertension: Secondary | ICD-10-CM

## 2011-04-03 DIAGNOSIS — Z5989 Other problems related to housing and economic circumstances: Secondary | ICD-10-CM

## 2011-04-03 DIAGNOSIS — Z598 Other problems related to housing and economic circumstances: Secondary | ICD-10-CM

## 2011-04-03 DIAGNOSIS — K429 Umbilical hernia without obstruction or gangrene: Secondary | ICD-10-CM

## 2011-04-03 DIAGNOSIS — J45909 Unspecified asthma, uncomplicated: Secondary | ICD-10-CM

## 2011-04-03 LAB — HEPATIC FUNCTION PANEL
ALT: 19 U/L (ref 0–35)
AST: 18 U/L (ref 0–37)
Albumin: 3.5 g/dL (ref 3.5–5.2)
Alkaline Phosphatase: 92 U/L (ref 39–117)
Bilirubin, Direct: 0 mg/dL (ref 0.0–0.3)
Total Bilirubin: 0.5 mg/dL (ref 0.3–1.2)
Total Protein: 6.7 g/dL (ref 6.0–8.3)

## 2011-04-03 LAB — LDL CHOLESTEROL, DIRECT: Direct LDL: 141.7 mg/dL

## 2011-04-03 LAB — LIPID PANEL
Cholesterol: 211 mg/dL — ABNORMAL HIGH (ref 0–200)
HDL: 49.1 mg/dL (ref >39.00)
Total CHOL/HDL Ratio: 4
Triglycerides: 77 mg/dL (ref 0.0–149.0)
VLDL: 15.4 mg/dL (ref 0.0–40.0)

## 2011-04-03 NOTE — Patient Instructions (Addendum)
Keep going with weight watcher as you are doing well.  No change  Ok to stay off bp meds but check readings once a month. Recheck in a year  With labs  Mammogram every other year

## 2011-04-03 NOTE — Progress Notes (Signed)
  Subjective:    Patient ID: Amy Stokes, female    DOB: 1950/11/15, 61 y.o.   MRN: [id number]  HPI Patient comes in today for follow up of  multiple medical problems.   Since her last visit she has begun On line  Weight watcher. For the year and has been losing weight goal is another 50 #  Less feet and ankle pain less le edema and more energy and exercise tolerance. Still has no health insurance .  Has labs from October   Including nl bmp cbc borderline alk phos .  Has been off  HT meds for a few months.  Readings have been in range usually 120/80 range and feels well.  Asthma  takes advair gets flare if stops .  Gets rx from [place]  . No other rx meds ust supplements   Off lovastatin also due for lipid check  Review of Systems NO cp sob had cervical polyp seen by Dr Eda Paschal and ok now. No other bleeding    Past history family history social history reviewed in the electronic medical record. Social married neither have health insurance  No tobacco doing some exercise    Objective:   Physical Exam wdwn in nad   Doing better  Wt Readings from Last 3 Encounters:  04/03/11 272 lb (123.378 kg)  01/31/10 309 lb (140.161 kg)  07/16/09 305 lb (138.347 kg)   HEENT grossly normal  Neck palpable thyroid Chest:  Clear to A&P without wheezes rales or rhonchi CV:  S1-S2 no gallops or murmurs peripheral perfusion is normal Abdomen:  Sof,t normal bowel sounds without hepatosplenomegaly, no guarding rebound or masses no CVA tenderness umbi hernia soft non tense No clubbing cyanosis slight  Edema slight color changes no acute joint changes  Labs reviewed from october  Labs done  Elsewhere     Assessment & Plan:  HT   Continue lsi.  Ok top stay off meds if controlled  Obesity  Losing weight.  Still at risk and counseling for continue loss  continuing to do weight watchers.     Asthma  Stable on controller med  On laba/steroid and flares if stops ok to rx .   LIPIDS  Off med  Check  labs today  Borderline alk phos  Recheck today rov in 1 year  Or as needed.  Get mammo when can

## 2011-04-04 LAB — TSH: TSH: 3.07 u[IU]/mL (ref 0.35–5.50)

## 2011-04-09 ENCOUNTER — Encounter: Payer: Self-pay | Admitting: *Deleted

## 2011-04-09 NOTE — Progress Notes (Signed)
Quick Note:  Mail letter to pt. ______

## 2011-04-16 ENCOUNTER — Telehealth: Payer: Self-pay | Admitting: Internal Medicine

## 2011-04-16 NOTE — Telephone Encounter (Signed)
Mailed pt letter. Pt aware of results.

## 2011-04-16 NOTE — Telephone Encounter (Signed)
Pt called req lab results.

## 2011-04-29 ENCOUNTER — Ambulatory Visit (INDEPENDENT_AMBULATORY_CARE_PROVIDER_SITE_OTHER): Payer: Self-pay | Admitting: Gynecology

## 2011-04-29 ENCOUNTER — Other Ambulatory Visit (HOSPITAL_COMMUNITY)
Admission: RE | Admit: 2011-04-29 | Discharge: 2011-04-29 | Disposition: A | Discharge status: Home / Self Care | Payer: Self-pay | Source: Ambulatory Visit | Attending: Gynecology | Admitting: Gynecology

## 2011-04-29 ENCOUNTER — Other Ambulatory Visit: Payer: Self-pay | Admitting: Gynecology

## 2011-04-29 ENCOUNTER — Encounter: Payer: Self-pay | Admitting: Gynecology

## 2011-04-29 VITALS — BP 132/88

## 2011-04-29 DIAGNOSIS — D259 Leiomyoma of uterus, unspecified: Secondary | ICD-10-CM

## 2011-04-29 DIAGNOSIS — D219 Benign neoplasm of connective and other soft tissue, unspecified: Secondary | ICD-10-CM

## 2011-04-29 DIAGNOSIS — N950 Postmenopausal bleeding: Secondary | ICD-10-CM

## 2011-04-29 DIAGNOSIS — Z124 Encounter for screening for malignant neoplasm of cervix: Secondary | ICD-10-CM

## 2011-04-29 DIAGNOSIS — Z01419 Encounter for gynecological examination (general) (routine) without abnormal findings: Secondary | ICD-10-CM | POA: Insufficient documentation

## 2011-04-29 NOTE — Progress Notes (Signed)
Addended by: Dayna Barker on: 04/29/2011 01:03 PM   Modules accepted: Orders

## 2011-04-29 NOTE — Patient Instructions (Signed)
Take a motrin before sonohysterogram next week. We will call you with results of today's biopsy.

## 2011-04-29 NOTE — Progress Notes (Signed)
Patient is a 61 year old who presented today to the office complaining of postmenopausal bleeding which she experienced fairly her part of January in this week as well. She is on no hormone replacement therapy. Patient had similar episode one year ago and had an ultrasound for/sonohysterogram and endometrial biopsy with the following:  Ultrasound measured 13 x 9.9 x 3.9 cm endometrial stripe 6.1 mm. Patient with 6 fibroids the largest one measuring 6.9 x 5.6 x 7.4 cm there appeared to be an endocervical polyp right and left ovary were not seen in the right adnexal mass with calcification measuring 3.3 x 2.5 cm questionable myoma versus right ovary. Sonohysterogram demonstrated no intracavitary defect.  Endometrial biopsy 04/29/2010 benign weakly proliferative endometrium limited material.  Pap smear January 2012 normal  Examination somewhat limited because of patient's abdominal girth and BMI. The cervix was cleansed with Betadine solution after the patient was counseled for an endometrial biopsy. Prior to the endometrial biopsy Pap smear was done. The uterus sounded to approximately 8 cm and a Pipelle was introduced into the intrauterine cavity and moderate amount of tissue was obtained and was submitted for histological evaluation.  Patient will schedule sonohysterogram for next week to make sure that these fibroids and not encroaching into the intrauterine cavity and to rule out that they have gotten bigger and to better assess her adnexa as well. Endometrial biopsy was done to rule out endometrial hyperplasia or endometrial carcinoma in this postmenopausal patient. She was instructed to take ibuprofen before coming to the office next week for the sonohysterogram. If the pathology report becomes available before her visit will notify her with the findings.

## 2011-04-30 ENCOUNTER — Other Ambulatory Visit: Payer: Self-pay | Admitting: Gynecology

## 2011-04-30 DIAGNOSIS — N950 Postmenopausal bleeding: Secondary | ICD-10-CM

## 2011-04-30 DIAGNOSIS — D219 Benign neoplasm of connective and other soft tissue, unspecified: Secondary | ICD-10-CM

## 2011-05-07 ENCOUNTER — Encounter (HOSPITAL_COMMUNITY): Payer: Self-pay

## 2011-05-07 ENCOUNTER — Other Ambulatory Visit: Payer: Self-pay | Admitting: Gynecology

## 2011-05-07 ENCOUNTER — Telehealth: Payer: Self-pay

## 2011-05-07 ENCOUNTER — Ambulatory Visit: Payer: Self-pay

## 2011-05-07 ENCOUNTER — Ambulatory Visit (INDEPENDENT_AMBULATORY_CARE_PROVIDER_SITE_OTHER): Payer: Self-pay | Admitting: Gynecology

## 2011-05-07 DIAGNOSIS — D219 Benign neoplasm of connective and other soft tissue, unspecified: Secondary | ICD-10-CM

## 2011-05-07 DIAGNOSIS — D252 Subserosal leiomyoma of uterus: Secondary | ICD-10-CM

## 2011-05-07 DIAGNOSIS — N83339 Acquired atrophy of ovary and fallopian tube, unspecified side: Secondary | ICD-10-CM

## 2011-05-07 DIAGNOSIS — D259 Leiomyoma of uterus, unspecified: Secondary | ICD-10-CM

## 2011-05-07 DIAGNOSIS — N852 Hypertrophy of uterus: Secondary | ICD-10-CM

## 2011-05-07 DIAGNOSIS — D251 Intramural leiomyoma of uterus: Secondary | ICD-10-CM

## 2011-05-07 DIAGNOSIS — N950 Postmenopausal bleeding: Secondary | ICD-10-CM | POA: Insufficient documentation

## 2011-05-07 NOTE — Progress Notes (Signed)
Amy Stokes is an 61 y.o. female. Who was seen in the office on January 29 and was experiencing postmenopausal bleeding. Patient had similar episode one year prior and only an ultrasound demonstrated several fibroids but no intracavitary abnormalities. On this recent office visit she had an endometrial biopsy demonstrating the following:  BENIGN PROLIFERATIVE PATTERN ENDOMETRIUM. - FRAGMENTS SUGGESTIVE OF AN ENDOMETRIAL POLYP IDENTIFIED. - NO ATYPIA OR HYPERPLASIA OR MALIGNANCY IDENTIFIED.  She presented to the office today for sonohysterogram. The ultrasound demonstrated a uterus that measured 12.3 x 12 x 8.6 cm similar in size from last year. She had several fibroids the largest one measuring 7.9 x 6.5 x 9.2 cm. Right and left ovary not seen. Sonohysterogram demonstrated a posterior left wall defect measuring 18 x 9 x 17 mm suspicious for an endometrial polyp.  Pertinent Gynecological History: Menses: post-menopausal Bleeding: post menopausal bleeding Contraception: none DES exposure: denies Blood transfusions: none Sexually transmitted diseases: no past history Previous GYN Procedures: DNC  Last mammogram: normal Date: 2003 Last pap: normal Date: 2013 OB History: G 1, P 0 AB 1   Menstrual History: Menarche age: 74 No LMP recorded. Patient is postmenopausal.    Past Medical History  Diagnosis Date  . Hypertension   . Obesity   . Fibroids   . Edema   . Asthma   . Hyperlipidemia   . Thyroid nodule 01/2009    left discovered on chest ct; needle bx "indeterminant" cannon r/o follicular neoplasm     Past Surgical History  Procedure Date  . Thyroid needle bx   . Mouth surgery   . Tonsillectomy     Family History  Problem Relation Age of Onset  . Arthritis Mother   . Other Mother     neurological disorder  . Pancreatic cancer Mother   . Heart disease Father   . Thyroid nodules Sister   . Heart disease Brother     Social History:  reports that she quit smoking  about 40 years ago. She has never used smokeless tobacco. She reports that she drinks alcohol. She reports that she does not use illicit drugs.  Allergies: No Known Allergies   (Not in a hospital admission)  @ROS @  There were no vitals taken for this visit.  Physical Exam:  HEENT:unremarkable Neck:Supple, midline, no thyroid megaly, no carotid bruits Lungs:  Clear to auscultation no rhonchi's or wheezes Heart:Regular rate and rhythm, no murmurs or gallops Breast Exam: Not done Abdomen: Pendulous soft nontender Pelvic:BUS within normal limits Vagina: No gross lesions on inspection or discharge Cervix: No gross lesions or discharge Uterus: 12 weeks size irregular Adnexa: Difficult to examine due to patient's abdominal girth and fibroids.  Extremities: No cords, no edema Rectal: Not done  No results found for this or any previous visit (from the past 24 hour(s)).  No results found.  Assessment/Plan: Postmenopausal patient's with bleeding attributed to endometrial polyp seen on sonohysterogram today. Endometrial biopsy benign endometrium suspicious for endometrial polyp. Patient will be scheduled for a resectoscopic polypectomy as an outpatient. The procedure was outlined as well as literature information was provided. The risks benefits and pros and cons were discussed. Risks discussed are as follows: The risk of infection (she'll receive prophylaxis antibiotics), the risk of deep venous thrombosis and subsequent pulmonary embolism (she will have PSA stockings), the risk of uterine perforation requiring emergency laparotomy with possible hysterectomy. In the event of hemorrhage and patient were to need blood or blood products there are risk such as anaphylactic  reaction hepatitis and AIDS. All the above was discussed with the patient in detail all questions were answered we'll follow accordingly.  FERNANDEZ,JUAN H 05/07/2011, 11:14 AM

## 2011-05-07 NOTE — Telephone Encounter (Signed)
At Dr. Manuela Schwartz request I called patient regarding scheduling Resectoscopic Polypectomy w/TruClear Morcellator.  She is private pay and I gave her the estimated charges. She must check on some financial arrangements and said she will get back to me today. I went ahead and scheduled her tentatively to hold time for her on next Th, Feb 14 to follow around 2:30pm.  Per Dr. Glenetta Hew patient will need a Cytotec tablet hs the night before based on what Romeo Apple said regarding having "to dilate up to the bigger scope due the 2.9 being on back order". I will discuss this with patient and phone it in when she calls me back later.

## 2011-05-07 NOTE — Patient Instructions (Signed)
Postmenopausal Bleeding Menopause is the gradual end of ovulation and menstrual periods. Postmenopausal bleeding is bleeding from the uterus 12 months or more after menstrual periods have stopped. Postmenopausal bleeding is different from the few, irregular periods that happen around the time of menopause. Postmenopausal bleeding is common, but not normal. Tell your caregiver if you have any bleeding after menopause.  CAUSES   Hormone therapy (HT). This is the most common cause of postmenopausal bleeding.   Cervical, ovarian, or endometrial cancer.   Thinning of the uterus endometrium (uterine atrophy).   Thyroid and other medical diseases.   Medications other than hormones.   Endometrium infection (endometritis).   Estrogen-secreting tumors in other parts of the body.   Cervical lesions (polps/infection).   Endometrial polyps.   Uterine tumors (fibroid/polyps).   Being very overweight (obese).  SYMPTOMS  Postmenopausal bleeding can start in different parts of the reproductive system.   Bleeding from the vagina may happen. This happens because when estrogen secretion stops, the vagina dries out and can weaken (atrophy). This is the most common cause of bleeding from the lower reproductive tract.   Lesions and cracks on the vulva may also bleed.   Sometimes bleeding happens after sexual intercourse.   Bleeding from the cervix if there are polyps, cancer, or an infection.   Bleeding can happen with or without a related infection.  DIAGNOSIS   The caregiver will ask for a detailed history of how long bleeding has been going on. A woman should keep a record of the time, the length, how often and amount of bleeding. She should tell the caregiver about any medications she is taking, especially any hormones or steroids.   The caregiver will do a pelvic exam and PAP test. The vulva and vagina are looked at for signs of atrophy. The caregiver will feel for any sign of uterine  fibroids. Depending on the exam results, more testing may be needed.  TREATMENT  Treatment depends on the cause of the bleeding.  It is common for women just beginning HT to have some bleeding. Most women who are on HT also take progesterone with estrogen if they still have their uterus. They may have monthly withdrawal bleeding. This is a normal side effect that often does not need treatment. Taking a low dose of estrogen and progesterone daily can prevent having a menstrual period.   Postmenopausal bleeding due to vulva or vaginal bleeding can be treated with estrogen creams, patches, or pills.   If cancer is found, some form of surgery is needed. The uterus, cervix, ovaries, and fallopian tubes may all be removed depending on the type and location of the cancer. If the problem is estrogen or androgen-producing tumors elsewhere in the body, these must also be surgically removed.   Postmenopausal bleeding that is not due to cancer and cannot be controlled by any other treatment usually requires a hysterectomy. This operation is not without risk and possible complications.   If the cause is endometrium thinning, hormones are given.   If there is endometrial hyperplasia, progesterone may be given or D and C may be done.   If there are polyps in the uterus, a hysteroscopy or D and C is done to remove them.   If fibroids are present, they can be removed or a hysterectomy may be needed.   If the problem is medical, thyroid or other medical treatment is done.   If medication is causing the problem, changing or stopping the medication may be   needed.  HOME CARE INSTRUCTIONS   Postmenopausal bleeding is not a preventable disorder. However, maintaining a healthy weight will decrease the chances of it happening.   See your caregiver if you are missing menstrual periods all the time.   A Pap test is done to screen for cervical cancer.   The first Pap test should be done at age 21.   Between  ages 21 and 29, Pap tests are repeated every 2 years.   Beginning at age 30, you are advised to have a Pap test every 3 years as long as your past 3 Pap tests have been normal.   Some women have medical problems that increase the chance of getting cervical cancer. Talk to your caregiver about these problems. It is especially important to talk to your caregiver if a new problem develops soon after your last Pap test. In these cases, your caregiver may recommend more frequent screening and Pap tests.   The above recommendations are the same for women who have or have not gotten the vaccine for HPV (Human Papillomavirus).   If you had a hysterectomy for a problem that was not a cancer or a condition that could lead to cancer, then you no longer need Pap tests. However, even if you no longer need a Pap test, a regular exam is a good idea to make sure no other problems are starting.    If you are between ages 65 and 70, and you have had normal Pap tests going back 10 years, you no longer need Pap tests. However, even if you no longer need a Pap test, a regular exam is a good idea to make sure no other problems are starting.    If you have had past treatment for cervical cancer or a condition that could lead to cancer, you need Pap tests and screening for cancer for at least 20 years after your treatment.   If Pap tests have been discontinued, risk factors (such as a new sexual partner) need to be re-assessed to determine if screening should be resumed.   Some women may need screenings more often if they are at high risk for cervical cancer.   See your caregiver if you have any kind of bleeding after menopause.  SEEK MEDICAL CARE IF:   You have any kind of vaginal bleeding after menopause.   You develop bleeding with sexual intercourse.   You are still having menstrual periods after age 55.   You are gaining too much weight.  Document Released: 06/25/2005 Document Revised: 09/30/2010  Document Reviewed: 07/20/2007 [employer] Patient Information 2012 [employer]. 

## 2011-05-16 ENCOUNTER — Telehealth: Payer: Self-pay

## 2011-05-16 NOTE — Telephone Encounter (Signed)
Patient we'll then have to contact us when she's ready for surgery and when she is able to gather her funds. We can also offer her financial assistance through [facility] if she is interested. More than likely her endometrial polyp may be benign but I cannot give her any guarantees whether it is malignant or not.

## 2011-05-16 NOTE — Telephone Encounter (Signed)
Patient called to cancel surgery in order to postpone. She said she understands she needs it but financially no way to do it at this time. She said it would take her life savings to have this surgery. I did explain to her that hospital does not have to have payment up front and she could make payments on the balance. She said she has no money to make payments with. 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 She has applied for financial assistance with [facility] this week and will wait to hear on that.  She will call me when ready to schedule. I will followup with her in 2 months if I don't hear from her before then

## 2011-05-20 ENCOUNTER — Inpatient Hospital Stay (HOSPITAL_COMMUNITY): Admission: RE | Admit: 2011-05-20 | Payer: Self-pay | Source: Ambulatory Visit

## 2011-05-22 ENCOUNTER — Ambulatory Visit (HOSPITAL_COMMUNITY): Admission: RE | Admit: 2011-05-22 | Payer: Self-pay | Source: Ambulatory Visit | Admitting: Gynecology

## 2011-05-22 ENCOUNTER — Encounter (HOSPITAL_COMMUNITY): Admission: RE | Payer: Self-pay | Source: Ambulatory Visit

## 2011-05-22 SURGERY — DILATATION & CURETTAGE/HYSTEROSCOPY WITH RESECTOCOPE
Anesthesia: Choice

## 2011-06-09 ENCOUNTER — Ambulatory Visit (INDEPENDENT_AMBULATORY_CARE_PROVIDER_SITE_OTHER): Payer: Self-pay | Admitting: Internal Medicine

## 2011-06-09 ENCOUNTER — Encounter: Payer: Self-pay | Admitting: Internal Medicine

## 2011-06-09 VITALS — BP 140/70 | HR 80 | Temp 98.7°F | Wt 275.0 lb

## 2011-06-09 DIAGNOSIS — K055 Other periodontal diseases: Secondary | ICD-10-CM

## 2011-06-09 DIAGNOSIS — J45909 Unspecified asthma, uncomplicated: Secondary | ICD-10-CM

## 2011-06-09 DIAGNOSIS — K068 Other specified disorders of gingiva and edentulous alveolar ridge: Secondary | ICD-10-CM

## 2011-06-09 DIAGNOSIS — I10 Essential (primary) hypertension: Secondary | ICD-10-CM

## 2011-06-09 MED ORDER — FLUTICASONE-SALMETEROL 100-50 MCG/DOSE IN AEPB: 1.0000 | INHALATION_SPRAY | Freq: Two times a day (BID) | RESPIRATORY_TRACT | Status: DC

## 2011-06-09 MED ORDER — LOSARTAN POTASSIUM 100 MG PO TABS: 100.0000 mg | ORAL_TABLET | Freq: Every day | ORAL | Status: DC

## 2011-06-09 NOTE — Progress Notes (Signed)
  Subjective:    Patient ID: Amy Stokes, female    DOB: 07/14/1950, 61 y.o.   MRN: [id number]  HPI Patient comes in today for SDA for  problem evaluation. Since her last visit she notedIntermittent gums bleeding   So checked her   bp  And having some elevations    At times.  ? Go back on medication  Doing lsi in the meantime   No CP SOB Otherwise feels fine and walking  Many days  And exercise tolerance  is better . Restarted weight watchers  Sleep  Snores  No anemia checked when donated blood  In the fall  14 range  Has uterine polyp but not do ing surgery at this time cause of cost  Has to pay out of pocket .   Nees refill of  advair  Works well suppression.  Review of Systems Neg fever syncope  Bruising GI sx otherwise . Seats fever  Change in asthma  Past history family history social history reviewed in the electronic medical record.     Objective:   Physical Exam Wt Readings from Last 3 Encounters:  06/09/11 275 lb (124.739 kg)  04/03/11 272 lb (123.378 kg)  01/31/10 309 lb (140.161 kg)   WDWN in nad HEENT grossly normal op clear no  Bleeding seen or lesion.  Chest:  Clear to A&P without wheezes rales or rhonchi CV:  S1-S2 no gallops or murmurs peripheral perfusion is normal repeat BP 140/80 right large DATA REVIEWED: Last ov and bp readings some  150 160 and other times 12- - 13- range  Pulse in 80 so cass 90 s      Assessment & Plan:  HT  Levels creeping back up restart cozaar  And can try 50 mg  First and keep [place] informed . Increase ot 100 mg if needed.  Obesity continue weight loss and weight watchers   Asthma  ok to refill meds today stable.   Stop the bid 325 asa  Can take 81 if wishes   Risk benefit  Gums bleeding See dentist  For cleaning as due.

## 2011-06-09 NOTE — Patient Instructions (Signed)
Restart  Losartan can take 1/2  Of this. Monitor  bp readings and contact us about this  If not coming down.  Otherwise sendu  [place] progress for our records.  Agree with weight watchers and weight loss.   See dentist for general cleaning .

## 2011-07-24 ENCOUNTER — Telehealth: Payer: Self-pay | Admitting: Gynecology

## 2011-07-24 NOTE — Telephone Encounter (Signed)
I called patient to follow-up with her regarding Resectoscopic Polypectomy that Dr. Glenetta Hew had recommended back in February but patient could not afford to have.  Patient has no insurance cannot afford surgery right now and tells me spoke with [place], the financial assistance coordinator at [facility] and she did not qualify for any assistance.  Patient said her financial situation has improved somewhat.  She is going to call Rayna again because she has forgotten the details of how much would be required in advance at [facility].  She said she does know that she needs this surgery but is going to have to work on saving a little money before she does it.  She will call me when ready to schedule.

## 2011-10-17 ENCOUNTER — Other Ambulatory Visit: Payer: Self-pay | Admitting: Family Medicine

## 2011-10-17 ENCOUNTER — Other Ambulatory Visit: Payer: Self-pay | Admitting: Internal Medicine

## 2011-10-17 MED ORDER — ALBUTEROL SULFATE HFA 108 (90 BASE) MCG/ACT IN AERS: 2.0000 | INHALATION_SPRAY | Freq: Four times a day (QID) | RESPIRATORY_TRACT | Status: DC | PRN

## 2011-10-17 MED ORDER — FLUTICASONE-SALMETEROL 100-50 MCG/DOSE IN AEPB: 1.0000 | INHALATION_SPRAY | Freq: Every day | RESPIRATORY_TRACT | Status: DC

## 2012-07-09 ENCOUNTER — Telehealth: Payer: Self-pay | Admitting: Internal Medicine

## 2012-07-09 MED ORDER — FLUTICASONE-SALMETEROL 100-50 MCG/DOSE IN AEPB: 1.0000 | INHALATION_SPRAY | Freq: Two times a day (BID) | RESPIRATORY_TRACT | Status: DC

## 2012-07-09 NOTE — Telephone Encounter (Signed)
Pharm needs clarification on instructions for Fluticasone-Salmeterol (ADVAIR DISKUS) 100-50 MCG/DOSE AEPB.

## 2012-07-09 NOTE — Telephone Encounter (Signed)
Can you verify that the directions in the chart are correct

## 2012-07-09 NOTE — Telephone Encounter (Signed)
Sent to the pharmacy by e-scribe. 

## 2012-07-09 NOTE — Telephone Encounter (Signed)
Would do 1 pull bid or as directed . i think she is due for yearly visit

## 2012-07-14 ENCOUNTER — Telehealth: Payer: Self-pay | Admitting: Internal Medicine

## 2012-07-14 NOTE — Telephone Encounter (Signed)
Called to get RX clarified for [employer].  [facility] called to clarify Advair Discus dose since prescription received said take 1 puff into lungs daily but it was formerly prescribed as twice daily.  Today, 07/15/12, Amy Stokes got a call from [facility] saying they have her RX ready for pick up. Amy Stokes sends her Rx to [facility] because she can get 3 Advair Discus for the price of one at [facility].  Amy Stokes wants Rx to go to [facility] at phone: [phone] or toll free fax [phone].  If office is unable to coordinate order with [facility], please write a new Rx and notify [name] when ready for pick up.

## 2012-07-15 ENCOUNTER — Other Ambulatory Visit: Payer: Self-pay | Admitting: Family Medicine

## 2012-07-15 MED ORDER — FLUTICASONE-SALMETEROL 100-50 MCG/DOSE IN AEPB: 1.0000 | INHALATION_SPRAY | Freq: Two times a day (BID) | RESPIRATORY_TRACT | Status: DC

## 2012-07-15 NOTE — Telephone Encounter (Signed)
Patient notified to pick up rx at the front desk.  This is not a rx we send to [place] since there drugs are not regulated like they are here.  It is up to the patient to take that risk.  She has been notified.

## 2012-07-24 ENCOUNTER — Other Ambulatory Visit: Payer: Self-pay | Admitting: Internal Medicine

## 2012-07-26 ENCOUNTER — Telehealth: Payer: Self-pay | Admitting: Family Medicine

## 2012-07-26 NOTE — Telephone Encounter (Signed)
This patient has not been seen in over 1 year.  Please call the patient and make an appt.  I have not contacted her.  Will refill her Ventolin 1 time until she is seen.

## 2012-07-26 NOTE — Telephone Encounter (Signed)
Pt will call back to sch.

## 2012-10-12 ENCOUNTER — Encounter: Payer: Self-pay | Admitting: Internal Medicine

## 2012-10-12 ENCOUNTER — Ambulatory Visit (INDEPENDENT_AMBULATORY_CARE_PROVIDER_SITE_OTHER): Payer: Self-pay | Admitting: Internal Medicine

## 2012-10-12 VITALS — BP 144/80 | HR 86 | Temp 98.0°F | Wt 294.0 lb

## 2012-10-12 DIAGNOSIS — E041 Nontoxic single thyroid nodule: Secondary | ICD-10-CM

## 2012-10-12 DIAGNOSIS — Z5989 Other problems related to housing and economic circumstances: Secondary | ICD-10-CM

## 2012-10-12 DIAGNOSIS — T7840XA Allergy, unspecified, initial encounter: Secondary | ICD-10-CM

## 2012-10-12 DIAGNOSIS — J45909 Unspecified asthma, uncomplicated: Secondary | ICD-10-CM

## 2012-10-12 DIAGNOSIS — Z598 Other problems related to housing and economic circumstances: Secondary | ICD-10-CM

## 2012-10-12 MED ORDER — FLUTICASONE-SALMETEROL 100-50 MCG/DOSE IN AEPB: 1.0000 | INHALATION_SPRAY | Freq: Two times a day (BID) | RESPIRATORY_TRACT | Status: DC

## 2012-10-12 MED ORDER — ALBUTEROL SULFATE HFA 108 (90 BASE) MCG/ACT IN AERS: 2.0000 | INHALATION_SPRAY | Freq: Four times a day (QID) | RESPIRATORY_TRACT | Status: DC | PRN

## 2012-10-12 NOTE — Progress Notes (Signed)
Chief Complaint  Patient presents with  . Follow-up    HPI: Patient comes in today for follow up of  multiple medical problems.  Last ov was 3 13   No insurance.   No eligible for reasonable cost for obamacare  Cause of husbands income . Cant afford yet.  Asthma ok on advair     1 puff bid .   Usually.    Needs both doses.  Rescue inhaler    Not needed  much .   ROS: See pertinent positives and negatives per HPI.  Past Medical History  Diagnosis Date  . Hypertension   . Obesity   . Fibroids   . Edema   . Asthma   . Hyperlipidemia   . Thyroid nodule 01/2009    left discovered on chest ct; needle bx "indeterminant" cannon r/o follicular neoplasm     Family History  Problem Relation Age of Onset  . Arthritis Mother   . Other Mother     neurological disorder  . Pancreatic cancer Mother   . Heart disease Father   . Thyroid nodules Sister   . Heart disease Brother     History   Social History  . Marital Status: Married    Spouse Name: N/A    Number of Children: N/A  . Years of Education: N/A   Social History Main Topics  . Smoking status: Former Smoker    Quit date: 04/29/1971  . Smokeless tobacco: Never Used  . Alcohol Use: Yes     Comment: occ  . Drug Use: No  . Sexually Active: No   Other Topics Concern  . None   Social History Narrative   Married   Occupation: Advertising account executive currently unemployed   Married   Regular exercise-no   HH of 2   Pet cat    Outpatient Encounter Prescriptions as of 10/12/2012  Medication Sig Dispense Refill  . aspirin 81 MG tablet Take 1 tablet (81 mg total) by mouth daily.      . Cholecalciferol (VITAMIN D3) 2000 UNITS TABS Take by mouth.      . Fluticasone-Salmeterol (ADVAIR DISKUS) 100-50 MCG/DOSE AEPB Inhale 1 puff into the lungs 2 (two) times daily. Or as directed  1 each  11  . Glucosamine HCl-MSM (GLUCOSAMINE-MSM PO) Take by mouth. 1500mg  - 1500mg       . loratadine (CLARITIN) 10 MG tablet Take 10 mg by mouth daily.       . MULTIPLE VITAMIN PO Take 1 tablet by mouth 2 (two) times daily. Vitafusion.      . Omega-3 Fatty Acids (FISH OIL) 1200 MG CAPS Take 2 capsules by mouth daily. Nature Made      . Probiotic Product (PROBIOTIC DAILY PO) Take by mouth. Insync      . [DISCONTINUED] Fluticasone-Salmeterol (ADVAIR DISKUS) 100-50 MCG/DOSE AEPB Inhale 1 puff into the lungs 2 (two) times daily. Or as directed  180 each  0  . [DISCONTINUED] Fluticasone-Salmeterol (ADVAIR DISKUS) 100-50 MCG/DOSE AEPB Inhale 1 puff into the lungs 2 (two) times daily. Or as directed  180 each  0  . [DISCONTINUED] VENTOLIN HFA 108 (90 BASE) MCG/ACT inhaler USE 2 PUFFS BY MOUTH EVERY 6 HOURS AS NEEDED FOR ASTHMA  18 each  0  . albuterol (PROAIR HFA) 108 (90 BASE) MCG/ACT inhaler Inhale 2 puffs into the lungs every 6 (six) hours as needed for wheezing.  1 Inhaler  2  . [DISCONTINUED] cholecalciferol (VITAMIN D) 1000 UNITS tablet Take 2,000 Units  by mouth daily.       . [DISCONTINUED] Fluticasone-Salmeterol (ADVAIR) 100-50 MCG/DOSE AEPB Inhale 1 puff into the lungs 2 (two) times daily. Or as directed       No facility-administered encounter medications on file as of 10/12/2012.    EXAM:  BP 144/80  Pulse 86  Temp(Src) 98 F (36.7 C) (Oral)  Wt 294 lb (133.358 kg)  BMI 44.71 kg/m2  SpO2 95%  Body mass index is 44.71 kg/(m^2).  GENERAL: vitals reviewed and listed above, alert, oriented, appears well hydrated and in no acute distress HEENT: atraumatic, conjunctiva  clear, no obvious abnormalities on inspection of external nose and ears OP : no lesion edema or exudate  NECK: no obvious masses on inspection  No adenopathy paplpable thyroid no discrete nodule?  LUNGS: clear to auscultation bilaterally, no wheezes, rales or rhonchi, good air movement CV: HRRR, no clubbing cyanosis or  peripheral edema nl cap refill  MS: moves all extremities without noticeable focal  abnormality PSYCH: pleasant and cooperative, no obvious depression or  anxiety Lab Results  Component Value Date   WBC 5.3 01/12/2008   HGB 11.8* 01/12/2008   HCT 36.3 01/12/2008   PLT 235 01/12/2008   GLUCOSE 112* 07/16/2009   CHOL 211* 04/03/2011   TRIG 77.0 04/03/2011   HDL 49.10 04/03/2011   LDLDIRECT 141.7 04/03/2011   LDLCALC 96 07/16/2009   ALT 19 04/03/2011   AST 18 04/03/2011   NA 141 07/16/2009   K 3.9 07/16/2009   CL 101 07/16/2009   CREATININE 0.7 07/16/2009   BUN 10 07/16/2009   CO2 34* 07/16/2009   TSH 3.07 04/03/2011    ASSESSMENT AND PLAN:  Discussed the following assessment and plan:  ASTHMA  ALLERGY  THYROID NODULE  Morbid obesity  Does not have health insurance Financial constraints    Sample and rx given  Disc  hcm healthy habits in the short run    Contact us if we can help.  May have to wiat until medicare age to "afford" complete care. patietn aware of hcm  -Patient advised to return or notify health care team  if symptoms worsen or persist or new concerns arise.  Patient Instructions  Continue lifestyle intervention healthy eating and exercise . ROV in 1 year or as needed     Neta Mends. Panosh M.D.

## 2012-10-12 NOTE — Patient Instructions (Signed)
Continue lifestyle intervention healthy eating and exercise . ROV in 1 year or as needed

## 2013-02-03 ENCOUNTER — Other Ambulatory Visit: Payer: Self-pay

## 2013-02-08 ENCOUNTER — Encounter: Payer: Self-pay | Admitting: Internal Medicine

## 2013-02-10 ENCOUNTER — Ambulatory Visit (INDEPENDENT_AMBULATORY_CARE_PROVIDER_SITE_OTHER): Payer: Self-pay | Admitting: Gynecology

## 2013-02-10 ENCOUNTER — Other Ambulatory Visit (HOSPITAL_COMMUNITY)
Admission: RE | Admit: 2013-02-10 | Discharge: 2013-02-10 | Disposition: A | Discharge status: Home / Self Care | Payer: Self-pay | Source: Ambulatory Visit | Attending: Gynecology | Admitting: Gynecology

## 2013-02-10 ENCOUNTER — Other Ambulatory Visit: Payer: Self-pay | Admitting: Gynecology

## 2013-02-10 ENCOUNTER — Encounter: Payer: Self-pay | Admitting: Gynecology

## 2013-02-10 VITALS — BP 140/86

## 2013-02-10 DIAGNOSIS — Z124 Encounter for screening for malignant neoplasm of cervix: Secondary | ICD-10-CM

## 2013-02-10 DIAGNOSIS — N950 Postmenopausal bleeding: Secondary | ICD-10-CM

## 2013-02-10 DIAGNOSIS — Z01419 Encounter for gynecological examination (general) (routine) without abnormal findings: Secondary | ICD-10-CM | POA: Insufficient documentation

## 2013-02-10 DIAGNOSIS — Z1151 Encounter for screening for human papillomavirus (HPV): Secondary | ICD-10-CM | POA: Insufficient documentation

## 2013-02-10 DIAGNOSIS — N83339 Acquired atrophy of ovary and fallopian tube, unspecified side: Secondary | ICD-10-CM

## 2013-02-10 NOTE — Patient Instructions (Signed)
Transvaginal Ultrasound Transvaginal ultrasound is a pelvic ultrasound, using a metal probe that is placed in the vagina, to look at a women's female organs. Transvaginal ultrasound is a method of seeing inside the pelvis of a woman. The ultrasound machine sends out sound waves from the transducer (probe). These sound waves bounce off body structures (like an echo) to create a picture. The picture shows up on a monitor. It is called transvaginal because the probe is inserted into the vagina. There should be very little discomfort from the vaginal probe. This test can also be used during pregnancy. Endovaginal ultrasound is another name for a transvaginal ultrasound. In a transabdominal ultrasound, the probe is placed on the outside of the belly. This method gives pictures that are lower quality than pictures from the transvaginal technique. Transvaginal ultrasound is used to look for problems of the female genital tract. Some such problems include:  Infertility problems.  Congenital (birth defect) malformations of the uterus and ovaries.  Tumors in the uterus.  Abnormal bleeding.  Ovarian tumors and cysts.  Abscess (inflamed tissue around pus) in the pelvis.  Unexplained abdominal or pelvic pain.  Pelvic infection. DURING PREGNANCY, TRANSVAGINAL ULTRASOUND MAY BE USED TO LOOK AT:  Normal pregnancy.  Ectopic pregnancy (pregnancy outside the uterus).  Fetal heartbeat.  Abnormalities in the pelvis, that are not seen well with transabdominal ultrasound.  Suspected twins or multiples.  Impending miscarriage.  Problems with the cervix (incompetent cervix, not able to stay closed and hold the baby).  When doing an amniocentesis (removing fluid from the pregnancy sac, for testing).  Looking for abnormalities of the baby.  Checking the growth, development, and age of the fetus.  Measuring the amount of fluid in the amniotic sac.  When doing an external version of the baby (moving  baby into correct position).  Evaluating the baby for problems in high risk pregnancies (biophysical profile).  Suspected fetal demise (death). Sometimes a special ultrasound method called Saline Infusion Sonography (SIS) is used for a more accurate look at the uterus. Sterile saline (salt water) is injected into the uterus of non-pregnant patients to see the inside of the uterus better. SIS is not used on pregnant women. The vaginal probe can also assist in obtaining biopsies of abnormal areas, in draining fluid from cysts on the ovary, and in finding IUDs (intrauterine device, birth control) that cannot be located. PREPARATION FOR TEST A transvaginal ultrasound is done with the bladder empty. The transabdominal ultrasound is done with your bladder full. You may be asked to drink several glasses of water before that exam. Sometimes, a transabdominal ultrasound is done just after a transvaginal ultrasound, to look at organs in your abdomen. PROCEDURE  You will lie down on a table, with your knees bent and your feet in foot holders. The probe is covered with a condom. A sterile lubricant is put into the vagina and on the probe. The lubricant helps transmit the sound waves and avoid irritating the vagina. Your caregiver will move the probe inside the vaginal cavity to scan the pelvic structures. A normal test will show a normal pelvis and normal contents. An abnormal test will show abnormalities of the pelvis, placenta, or baby. ABNORMAL RESULTS MAY BE DUE TO:  Growths or tumors in the:  Uterus.  Ovaries.  Vagina.  Other pelvic structures.  Non-cancerous growths of the uterus and ovaries.  Twisting of the ovary, cutting off blood supply to the ovary (ovarian torsion).  Areas of infection, including:  Pelvic   inflammatory disease.  Abscess in the pelvis.  Locating an IUD. PROBLEMS FOUND IN PREGNANT WOMEN MAY INCLUDE:  Ectopic pregnancy (pregnancy outside the uterus).  Multiple  pregnancies.  Early dilation (opening) of the cervix. This may indicate an incompetent cervix and early delivery.  Impending miscarriage.  Fetal death.  Problems with the placenta, including:  Placenta has grown over the opening of the womb (placenta previa).  Placenta has separated early in the womb (placental abruption).  Placenta grows into the muscle of the uterus (placenta accreta).  Tumors of pregnancy, including gestational trophoblastic disease. This is an abnormal pregnancy, with no fetus. The uterus is filled with many grape-like cysts that could sometimes be cancerous.  Incorrect position of the fetus (breech, vertex).  Intrauterine fetal growth retardation (IUGR) (poor growth in the womb).  Fetal abnormalities or infection. RISKS AND COMPLICATIONS There are no known risks to the ultrasound procedure. There is no X-ray used when doing an ultrasound. Document Released: 02/27/2004 Document Revised: 06/09/2011 Document Reviewed: 02/14/2009 [employer] Patient Information 2014 [employer]. Endometrial Biopsy Endometrial biopsy is a procedure in which a tissue sample is taken from inside the uterus. The tissue sample is then looked at under a microscope to see if the tissue is normal or abnormal. The endometrium is the lining of the uterus. This procedure helps determine where you are in your menstrual cycle and how hormone levels are affecting the lining of the uterus. This procedure may also be used to evaluate uterine bleeding or to diagnose endometrial cancer, tuberculosis, polyps, or inflammatory conditions.  LET YOUR HEALTH CARE PROVIDER KNOW ABOUT:  Any allergies you have.  All medicines you are taking, including vitamins, herbs, eye drops, creams, and over-the-counter medicines.  Previous problems you or members of your family have had with the use of anesthetics.  Any blood disorders you have.  Previous surgeries you have had.  Medical conditions you  have.  Possibility of pregnancy. RISKS AND COMPLICATIONS Generally, this is a safe procedure. However, as with any procedure, complications can occur. Possible complications include:  Bleeding.  Pelvic infection.  Puncture of the uterine wall with the biopsy device (rare). BEFORE THE PROCEDURE   Keep a record of your menstrual cycles as directed by your health care provider. You may need to schedule your procedure for a specific time in your cycle.  You may want to bring a sanitary pad to wear home after the procedure.  Arrange for someone to drive you home after the procedure if you will be given a medicine to help you relax (sedative). PROCEDURE   You may be given a sedative to relax you.  You will lie on an exam table with your feet and legs supported as in a pelvic exam.  Your health care provider will insert an instrument (speculum) into your vagina to see your cervix.  Your cervix will be cleansed with an antiseptic solution. A medicine (local anesthetic) will be used to numb the cervix.  A forceps instrument (tenaculum) will be used to hold your cervix steady for the biopsy.  A thin, rodlike instrument (uterine sound) will be inserted through your cervix to determine the length of your uterus and the location where the biopsy sample will be removed.  A thin, flexible tube (catheter) will be inserted through your cervix and into the uterus. The catheter is used to collect the biopsy sample from your endometrial tissue.  The catheter and speculum will then be removed, and the tissue sample will be sent to   a lab for examination. AFTER THE PROCEDURE  You will rest in a recovery area until you are ready to go home.  You may have mild cramping and a small amount of vaginal bleeding for a few days after the procedure. This is normal.  Make sure you find out how to get your test results. Document Released: 07/18/2004 Document Revised: 11/17/2012 Document Reviewed:  09/01/2012 [employer] Patient Information 2014 [employer].  

## 2013-02-10 NOTE — Progress Notes (Signed)
Patient is a 62 year old who presented to the office today complaining of postmenopausal bleeding. Review of patient's medical history indicated in January 2013 she had similar complaints and an evaluation was started. She had an ultrasound which demonstrated the following:  Ultrasound measured 13 x 9.9 x 3.9 cm endometrial stripe 6.1 mm. Patient with 6 fibroids the largest one measuring 6.9 x 5.6 x 7.4 cm there appeared to be an endocervical polyp right and left ovary were not seen in the right adnexal mass with calcification measuring 3.3 x 2.5 cm questionable myoma versus right ovary. Sonohysterogram demonstrated no intracavitary defect.   Pap smear January 2012 normal  Endometrial biopsy 04/29/2010  BENIGN PROLIFERATIVE PATTERN ENDOMETRIUM.  - FRAGMENTS SUGGESTIVE OF AN ENDOMETRIAL POLYP IDENTIFIED.  - NO ATYPIA OR HYPERPLASIA OR MALIGNANCY IDENTIFIED.  A sonohysterogram was done on 05/07/2011 with the following findings: The ultrasound demonstrated a uterus that measured 12.3 x 12 x 8.6 cm similar in size from last year. She had several fibroids the largest one measuring 7.9 x 6.5 x 9.2 cm. Right and left ovary not seen. Sonohysterogram demonstrated a posterior left wall defect measuring 18 x 9 x 17 mm suspicious for an endometrial polyp.  Patient was then scheduled to undergo resectoscopic polypectomy but canceled her surgery due to financial causes. She now states that one month ago she had some bleeding for a few days and then stopped and then this weekend she has had blood-tinged mucus material vaginal. She denied any cramping or any other symptoms. Patient has not had a mammogram since 2003 and states that her colonoscopy is up to date.  Due to patient's postmenopausal bleeding she was counseled for an endometrial biopsy in the office today.  Pelvic exam: Bartholin's urethra Skene was within normal limits Vagina mucoid blood-tinged material was noted no gross lesions seen Cervix: No  active bleeding no lesions seen Uterus: Limited exam due to patient's abdominal girth Adnexa: Limited due to patient's abdominal girth Rectal exam: Not done  Prior to pelvic exam a Pap smear was obtained. Afterwards the cervix was cleansed with Betadine solution. A Pipelle was introduced into the uterine cavity and tissue was submitted for histological evaluation.  Assessment/plan: Patient with postmenopausal bleeding in 2013 was identified as having endometrial polyp but did not return to have it removed surgically. Patient will return to the office next week for a sonohysterogram to reassess the cavity and to compare with 2013. Will notify the patient the results of endometrial biopsy once it becomes available. We'll then manage accordingly depending on the results of the upcoming sonohysterogram.

## 2013-02-10 NOTE — Addendum Note (Signed)
Addended by: Bertram Savin A on: 02/10/2013 08:55 AM   Modules accepted: Orders

## 2013-02-11 ENCOUNTER — Other Ambulatory Visit: Payer: Self-pay | Admitting: Gynecology

## 2013-02-11 DIAGNOSIS — N950 Postmenopausal bleeding: Secondary | ICD-10-CM

## 2013-02-28 ENCOUNTER — Ambulatory Visit (INDEPENDENT_AMBULATORY_CARE_PROVIDER_SITE_OTHER): Payer: Self-pay | Admitting: Gynecology

## 2013-02-28 ENCOUNTER — Other Ambulatory Visit: Payer: Self-pay

## 2013-02-28 ENCOUNTER — Ambulatory Visit (INDEPENDENT_AMBULATORY_CARE_PROVIDER_SITE_OTHER): Payer: Self-pay

## 2013-02-28 ENCOUNTER — Other Ambulatory Visit: Payer: Self-pay | Admitting: Gynecology

## 2013-02-28 ENCOUNTER — Encounter: Payer: Self-pay | Admitting: Gynecology

## 2013-02-28 ENCOUNTER — Ambulatory Visit: Payer: Self-pay | Admitting: Gynecology

## 2013-02-28 DIAGNOSIS — D259 Leiomyoma of uterus, unspecified: Secondary | ICD-10-CM

## 2013-02-28 DIAGNOSIS — N83209 Unspecified ovarian cyst, unspecified side: Secondary | ICD-10-CM

## 2013-02-28 DIAGNOSIS — N852 Hypertrophy of uterus: Secondary | ICD-10-CM

## 2013-02-28 DIAGNOSIS — N8500 Endometrial hyperplasia, unspecified: Secondary | ICD-10-CM | POA: Insufficient documentation

## 2013-02-28 DIAGNOSIS — E65 Localized adiposity: Secondary | ICD-10-CM

## 2013-02-28 DIAGNOSIS — N83339 Acquired atrophy of ovary and fallopian tube, unspecified side: Secondary | ICD-10-CM

## 2013-02-28 DIAGNOSIS — N950 Postmenopausal bleeding: Secondary | ICD-10-CM

## 2013-02-28 DIAGNOSIS — D251 Intramural leiomyoma of uterus: Secondary | ICD-10-CM

## 2013-02-28 DIAGNOSIS — L909 Atrophic disorder of skin, unspecified: Secondary | ICD-10-CM

## 2013-02-28 MED ORDER — MEDROXYPROGESTERONE ACETATE 10 MG PO TABS: ORAL_TABLET | ORAL | Status: DC

## 2013-02-28 NOTE — Patient Instructions (Signed)
Exercise to Lose Weight Exercise and a healthy diet may help you lose weight. Your doctor may suggest specific exercises. EXERCISE IDEAS AND TIPS  Choose low-cost things you enjoy doing, such as walking, bicycling, or exercising to workout videos.   Take stairs instead of the elevator.   Walk during your lunch break.   Park your car further away from work or school.   Go to a gym or an exercise class.   Start with 5 to 10 minutes of exercise each day. Build up to 30 minutes of exercise 4 to 6 days a week.   Wear shoes with good support and comfortable clothes.   Stretch before and after working out.   Work out until you breathe harder and your heart beats faster.   Drink extra water when you exercise.   Do not do so much that you hurt yourself, feel dizzy, or get very short of breath.  Exercises that burn about 150 calories:  Running 1  miles in 15 minutes.   Playing volleyball for 45 to 60 minutes.   Washing and waxing a car for 45 to 60 minutes.   Playing touch football for 45 minutes.   Walking 1  miles in 35 minutes.   Pushing a stroller 1  miles in 30 minutes.   Playing basketball for 30 minutes.   Raking leaves for 30 minutes.   Bicycling 5 miles in 30 minutes.   Walking 2 miles in 30 minutes.   Dancing for 30 minutes.   Shoveling snow for 15 minutes.   Swimming laps for 20 minutes.   Walking up stairs for 15 minutes.   Bicycling 4 miles in 15 minutes.   Gardening for 30 to 45 minutes.   Jumping rope for 15 minutes.   Washing windows or floors for 45 to 60 minutes.  Document Released: 04/19/2010 Document Revised: 11/27/2010 Document Reviewed: 04/19/2010 [employer] Patient Information 2012 [employer].                                          Patient information: High cholesterol (The Basics)  What is cholesterol? - Cholesterol is a substance that is found in the blood. Everyone has some. It is needed for good health. The problem is, people  sometimes have too much cholesterol. Compared with people with normal cholesterol, people with high cholesterol have a higher risk of heart attacks, strokes, and other health problems. The higher your cholesterol, the higher your risk of these problems. Cholesterol levels in your body are determined significantly by your diet. Cholesterol levels may also be related to heart disease. The following material helps to explain this relationship and discusses what you can do to help keep your heart healthy. Not all cholesterol is bad. Low-density lipoprotein (LDL) cholesterol is the "bad" cholesterol. It may cause fatty deposits to build up inside your arteries. High-density lipoprotein (HDL) cholesterol is "good." It helps to remove the "bad" LDL cholesterol from your blood. Cholesterol is a very important risk factor for heart disease. Other risk factors are high blood pressure, smoking, stress, heredity, and weight.  The heart muscle gets its supply of blood through the coronary arteries. If your LDL cholesterol is high and your HDL cholesterol is low, you are at risk for having fatty deposits build up in your coronary arteries. This leaves less room through which blood can flow. Without sufficient blood and   oxygen, the heart muscle cannot function properly and you may feel chest pains (angina pectoris). When a coronary artery closes up entirely, a part of the heart muscle may die, causing a heart attack (myocardial infarction).  CHECKING CHOLESTEROL When your caregiver sends your blood to a lab to be analyzed for cholesterol, a complete lipid (fat) profile may be done. With this test, the total amount of cholesterol and levels of LDL and HDL are determined. Triglycerides are a type of fat that circulates in the blood and can also be used to determine heart disease risk. Are there different types of cholesterol? - Yes, there are a few different types. If you get a cholesterol test, you may hear your doctor or  nurse talk about: Total cholesterol  LDL cholesterol - Some people call this the "bad" cholesterol. That's because having high LDL levels raises your risk of heart attacks, strokes, and other health problems.  HDL cholesterol - Some people call this the "good" cholesterol. That's because having high HDL levels lowers your risk of heart attacks, strokes, and other health problems.  Non-HDL cholesterol - Non-HDL cholesterol is your total cholesterol minus your HDL cholesterol.  Triglycerides - Triglycerides are not cholesterol. They are a type of fat. But they often get measured when cholesterol is measured. (Having high triglycerides also seems to increase the risk of heart attacks and strokes.)   Keep in mind, though, that many people who cannot meet these goals still have a low risk of heart attacks and strokes. What should I do if my doctor tells me I have high cholesterol? - Ask your doctor what your overall risk of heart attacks and strokes is. High cholesterol, by itself, is not always a reason to worry. Having high cholesterol is just one of many things that can increase your risk of heart attacks and strokes. Other factors that increase your risk include:  Cigarette smoking  High blood pressure  Having a parent, sister, or brother who got heart disease at a young age (Young, in this case, means younger than 55 for men and younger than 65 for women.)  Being a man (Women are at risk, too, but men have a higher risk.)  Older age  If you are at high risk of heart attacks and strokes, having high cholesterol is a problem. On the other hand, if you have are at low risk, having high cholesterol may not mean much. Should I take medicine to lower cholesterol? - Not everyone who has high cholesterol needs medicines. Your doctor or nurse will decide if you need them based on your age, family history, and other health concerns.  You should probably take a cholesterol-lowering medicine called a statin if  you: Already had a heart attack or stroke  Have known heart disease  Have diabetes  Have a condition called peripheral artery disease, which makes it painful to walk, and happens when the arteries in your legs get clogged with fatty deposits  Have an abdominal aortic aneurysm, which is a widening of the main artery in the belly  Most people with any of the conditions listed above should take a statin no matter what their cholesterol level is. If your doctor or nurse puts you on a statin, stay on it. The medicine may not make you feel any different. But it can help prevent heart attacks, strokes, and death.  Can I lower my cholesterol without medicines? - Yes, you can lower your cholesterol some by:  Avoiding red meat, butter,   fried foods, cheese, and other foods that have a lot of saturated fat  Losing weight (if you are overweight)  Being more active Even if these steps do little to change your cholesterol, they can improve your health in many ways.                                                   Cholesterol Control Diet  CONTROLLING CHOLESTEROL WITH DIET Although exercise and lifestyle factors are important, your diet is key. That is because certain foods are known to raise cholesterol and others to lower it. The goal is to balance foods for their effect on cholesterol and more importantly, to replace saturated and trans fat with other types of fat, such as monounsaturated fat, polyunsaturated fat, and omega-3 fatty acids. On average, a person should consume no more than 15 to 17 g of saturated fat daily. Saturated and trans fats are considered "bad" fats, and they will raise LDL cholesterol. Saturated fats are primarily found in animal products such as meats, butter, and cream. However, that does not mean you need to sacrifice all your favorite foods. Today, there are good tasting, low-fat, low-cholesterol substitutes for most of the things you like to eat. Choose low-fat or nonfat  alternatives. Choose round or loin cuts of red meat, since these types of cuts are lowest in fat and cholesterol. Chicken (without the skin), fish, veal, and ground turkey breast are excellent choices. Eliminate fatty meats, such as hot dogs and salami. Even shellfish have little or no saturated fat. Have a 3 oz (85 g) portion when you eat lean meat, poultry, or fish. Trans fats are also called "partially hydrogenated oils." They are oils that have been scientifically manipulated so that they are solid at room temperature resulting in a longer shelf life and improved taste and texture of foods in which they are added. Trans fats are found in stick margarine, some tub margarines, cookies, crackers, and baked goods.  When baking and cooking, oils are an excellent substitute for butter. The monounsaturated oils are especially beneficial since it is believed they lower LDL and raise HDL. The oils you should avoid entirely are saturated tropical oils, such as coconut and palm.  Remember to eat liberally from food groups that are naturally free of saturated and trans fat, including fish, fruit, vegetables, beans, grains (barley, rice, couscous, bulgur wheat), and pasta (without cream sauces).  IDENTIFYING FOODS THAT LOWER CHOLESTEROL  Soluble fiber may lower your cholesterol. This type of fiber is found in fruits such as apples, vegetables such as broccoli, potatoes, and carrots, legumes such as beans, peas, and lentils, and grains such as barley. Foods fortified with plant sterols (phytosterol) may also lower cholesterol. You should eat at least 2 g per day of these foods for a cholesterol lowering effect.  Read package labels to identify low-saturated fats, trans fats free, and low-fat foods at the supermarket. Select cheeses that have only 2 to 3 g saturated fat per ounce. Use a heart-healthy tub margarine that is free of trans fats or partially hydrogenated oil. When buying baked goods (cookies, crackers), avoid  partially hydrogenated oils. Breads and muffins should be made from whole grains (whole-wheat or whole oat flour, instead of "flour" or "enriched flour"). Buy non-creamy canned soups with reduced salt and no added fats.  FOOD PREPARATION TECHNIQUES    Never deep-fry. If you must fry, either stir-fry, which uses very little fat, or use non-stick cooking sprays. When possible, broil, bake, or roast meats, and steam vegetables. Instead of dressing vegetables with butter or margarine, use lemon and herbs, applesauce and cinnamon (for squash and sweet potatoes), nonfat yogurt, salsa, and low-fat dressings for salads.  LOW-SATURATED FAT / LOW-FAT FOOD SUBSTITUTES Meats / Saturated Fat (g)  Avoid: Steak, marbled (3 oz/85 g) / 11 g   Choose: Steak, lean (3 oz/85 g) / 4 g   Avoid: Hamburger (3 oz/85 g) / 7 g   Choose: Hamburger, lean (3 oz/85 g) / 5 g   Avoid: Ham (3 oz/85 g) / 6 g   Choose: Ham, lean cut (3 oz/85 g) / 2.4 g   Avoid: Chicken, with skin, dark meat (3 oz/85 g) / 4 g   Choose: Chicken, skin removed, dark meat (3 oz/85 g) / 2 g   Avoid: Chicken, with skin, light meat (3 oz/85 g) / 2.5 g   Choose: Chicken, skin removed, light meat (3 oz/85 g) / 1 g  Dairy / Saturated Fat (g)  Avoid: Whole milk (1 cup) / 5 g   Choose: Low-fat milk, 2% (1 cup) / 3 g   Choose: Low-fat milk, 1% (1 cup) / 1.5 g   Choose: Skim milk (1 cup) / 0.3 g   Avoid: Hard cheese (1 oz/28 g) / 6 g   Choose: Skim milk cheese (1 oz/28 g) / 2 to 3 g   Avoid: Cottage cheese, 4% fat (1 cup) / 6.5 g   Choose: Low-fat cottage cheese, 1% fat (1 cup) / 1.5 g   Avoid: Ice cream (1 cup) / 9 g   Choose: Sherbet (1 cup) / 2.5 g   Choose: Nonfat frozen yogurt (1 cup) / 0.3 g   Choose: Frozen fruit bar / trace   Avoid: Whipped cream (1 tbs) / 3.5 g   Choose: Nondairy whipped topping (1 tbs) / 1 g  Condiments / Saturated Fat (g)  Avoid: Mayonnaise (1 tbs) / 2 g   Choose: Low-fat mayonnaise (1 tbs) / 1 g    Avoid: Butter (1 tbs) / 7 g   Choose: Extra light margarine (1 tbs) / 1 g   Avoid: Coconut oil (1 tbs) / 11.8 g   Choose: Olive oil (1 tbs) / 1.8 g   Choose: Corn oil (1 tbs) / 1.7 g   Choose: Safflower oil (1 tbs) / 1.2 g   Choose: Sunflower oil (1 tbs) / 1.4 g   Choose: Soybean oil (1 tbs) / 2.4 g   Choose: Canola oil (1 tbs) / 1 g   

## 2013-02-28 NOTE — Progress Notes (Signed)
   Patient presented to the office today for followup as part of her evaluation for postmenopausal bleeding. Patient was seen in the office November 13. Review of patient's medical history indicated in January 2013 she had similar complaints and an evaluation was started. She had an ultrasound which demonstrated the following:   Ultrasound measured 13 x 9.9 x 3.9 cm endometrial stripe 6.1 mm. Patient with 6 fibroids the largest one measuring 6.9 x 5.6 x 7.4 cm there appeared to be an endocervical polyp right and left ovary were not seen in the right adnexal mass with calcification measuring 3.3 x 2.5 cm questionable myoma versus right ovary. Sonohysterogram demonstrated no intracavitary defect.   Pap smear January 2012 normal  Endometrial biopsy 04/29/2010  BENIGN PROLIFERATIVE PATTERN ENDOMETRIUM.  - FRAGMENTS SUGGESTIVE OF AN ENDOMETRIAL POLYP IDENTIFIED.  - NO ATYPIA OR HYPERPLASIA OR MALIGNANCY IDENTIFIED.  A sonohysterogram was done on 05/07/2011 with the following findings:   The ultrasound demonstrated a uterus that measured 12.3 x 12 x 8.6 cm similar in size from last year. She had several fibroids the largest one measuring 7.9 x 6.5 x 9.2 cm. Right and left ovary not seen. Sonohysterogram demonstrated a posterior left wall defect measuring 18 x 9 x 17 mm suspicious for an endometrial polyp.   Last office visit on November 13 of this year she underwent an endometrial biopsy Diagnosis Endometrium, biopsy, uterus SCANTY PROLIFERATIVE ENDOMETRIUM WITH FOCAL SIMPLE HYPERPLASIA WITHOUT ATYPIA AND ABUNDANT MUCUS. NO ATYPIA OR EVIDENCE OF MALIGNANCYwith the following results reported:  Patient was here for an ultrasound and possible sonohysterogram but as a result of the endometrial biopsy findings the sonohysterogram was not done but the ultrasound was with result as follows: Uterus measures 11.6 x 4.3 x 7.8 cm with endometrial stripe of 10 mm. Patient had a calcified intramural myoma which  measures 65 x 69 x 84 mm E. One measuring 44 x 48 x 45 mm and a third one measuring 22 x 28 mm and 2 smaller ones 23 and 26 mm respectively. Patient had a prominent avascular endometrium. Fluid in the cervix a defect measuring 14 x 4 mm was noted. Right ovary had an echo free follicle measuring 24 x 26 mm. Left ovary not seen.  Assessment/plan: Postmenopausal patient who is overweight with postmenopausal bleeding underwent endometrial biopsy which demonstrated simple endometrial hyperplasia without atypia will be treated with progestational agent such as Provera 10 mg daily for 10 days of the month for the next 3 months and to return to the office for an endometrial biopsy and sonohysterogram. We discussed importance of losing weight and exercising regularly.

## 2013-04-11 ENCOUNTER — Emergency Department (HOSPITAL_COMMUNITY)
Admission: EM | Admit: 2013-04-11 | Discharge: 2013-04-11 | Disposition: A | Discharge status: Home / Self Care | Payer: Self-pay | Attending: Emergency Medicine | Admitting: Emergency Medicine

## 2013-04-11 ENCOUNTER — Encounter (HOSPITAL_COMMUNITY): Payer: Self-pay | Admitting: Emergency Medicine

## 2013-04-11 ENCOUNTER — Emergency Department (HOSPITAL_COMMUNITY): Payer: Self-pay

## 2013-04-11 DIAGNOSIS — Z79899 Other long term (current) drug therapy: Secondary | ICD-10-CM | POA: Insufficient documentation

## 2013-04-11 DIAGNOSIS — R05 Cough: Secondary | ICD-10-CM

## 2013-04-11 DIAGNOSIS — IMO0002 Reserved for concepts with insufficient information to code with codable children: Secondary | ICD-10-CM | POA: Insufficient documentation

## 2013-04-11 DIAGNOSIS — Z9089 Acquired absence of other organs: Secondary | ICD-10-CM | POA: Insufficient documentation

## 2013-04-11 DIAGNOSIS — Z87891 Personal history of nicotine dependence: Secondary | ICD-10-CM | POA: Insufficient documentation

## 2013-04-11 DIAGNOSIS — Z8742 Personal history of other diseases of the female genital tract: Secondary | ICD-10-CM | POA: Insufficient documentation

## 2013-04-11 DIAGNOSIS — R059 Cough, unspecified: Secondary | ICD-10-CM

## 2013-04-11 DIAGNOSIS — E669 Obesity, unspecified: Secondary | ICD-10-CM | POA: Insufficient documentation

## 2013-04-11 DIAGNOSIS — J45901 Unspecified asthma with (acute) exacerbation: Secondary | ICD-10-CM | POA: Insufficient documentation

## 2013-04-11 DIAGNOSIS — R0602 Shortness of breath: Secondary | ICD-10-CM

## 2013-04-11 DIAGNOSIS — I10 Essential (primary) hypertension: Secondary | ICD-10-CM | POA: Insufficient documentation

## 2013-04-11 DIAGNOSIS — Z7982 Long term (current) use of aspirin: Secondary | ICD-10-CM | POA: Insufficient documentation

## 2013-04-11 NOTE — ED Provider Notes (Signed)
Medical screening examination/treatment/procedure(s) were performed by non-physician practitioner and as supervising physician I was immediately available for consultation/collaboration.    Teressa Lower, MD 04/11/13 [phone]

## 2013-04-11 NOTE — Discharge Instructions (Signed)
You were seen for your cough and shortness of breath symptoms.  Your chest x-ray today did not show any signs for concerning or emergent reasons for your cough and shortness of breath.  Your provider(s) did discuss options for further testing but you did not wish to have any other tests.  Please follow up with your primary care provider for continued evaluation and treatment.  Return at any time if you change your mind and wish to have further testing or if you have any return or worsening symptoms.    Cough, Adult  A cough is a reflex that helps clear your throat and airways. It can help heal the body or may be a reaction to an irritated airway. A cough may only last 2 or 3 weeks (acute) or may last more than 8 weeks (chronic).  CAUSES Acute cough:  Viral or bacterial infections. Chronic cough:  Infections.  Allergies.  Asthma.  Post-nasal drip.  Smoking.  Heartburn or acid reflux.  Some medicines.  Chronic lung problems (COPD).  Cancer. SYMPTOMS   Cough.  Fever.  Chest pain.  Increased breathing rate.  High-pitched whistling sound when breathing (wheezing).  Colored mucus that you cough up (sputum). TREATMENT   A bacterial cough may be treated with antibiotic medicine.  A viral cough must run its course and will not respond to antibiotics.  Your caregiver may recommend other treatments if you have a chronic cough. HOME CARE INSTRUCTIONS   Only take over-the-counter or prescription medicines for pain, discomfort, or fever as directed by your caregiver. Use cough suppressants only as directed by your caregiver.  Use a cold steam vaporizer or humidifier in your bedroom or home to help loosen secretions.  Sleep in a semi-upright position if your cough is worse at night.  Rest as needed.  Stop smoking if you smoke. SEEK IMMEDIATE MEDICAL CARE IF:   You have pus in your sputum.  Your cough starts to worsen.  You cannot control your cough with suppressants  and are losing sleep.  You begin coughing up blood.  You have difficulty breathing.  You develop pain which is getting worse or is uncontrolled with medicine.  You have a fever. MAKE SURE YOU:   Understand these instructions.  Will watch your condition.  Will get help right away if you are not doing well or get worse. Document Released: 09/13/2010 Document Revised: 06/09/2011 Document Reviewed: 09/13/2010 [facility] Patient Information 2014 [place].

## 2013-04-11 NOTE — ED Notes (Signed)
Pt. reports productive cough with chest congestion /for several days unrelieved by MDI .

## 2013-04-11 NOTE — ED Provider Notes (Signed)
CSN: [id number]     Arrival date & time 04/11/13  0005 History   First MD Initiated Contact with Patient 04/11/13 0035     Chief Complaint  Patient presents with  . Cough   HPI  History provided by the patient. Patient is a 63 year old female with history of hypertension, hyperlipidemia and asthma who presents with complaints of episode of worsening shortness of breath and wheezing. Patient states she has had a cough that was primarily productive for the past week or more. She states that she initially felt very poorly but now has only had a cough that has remained. This evening she was having more of a dry cough and felt it was hard to bring up any mucus. She decided to use her albuterol inhaler which she does not use often and states that shortly after she felt more tightness and worsening shortness of breath. She reports having significant shortness of breath even with trying to walk and move. She did also use her Advair Diskus. Once arriving to the emergency room and waiting in the waiting room she did begin to feel better. Her symptoms were not associated with any chest pain or heart palpitations. She did not feel a racing heart. She denies any diaphoresis or nausea. She has not had any recent fever, chills or sweats. No other aggravating or alleviating factors. No other associated symptoms. Currently patient reports feeling well a baseline.    Past Medical History  Diagnosis Date  . Hypertension   . Obesity   . Fibroids   . Edema   . Asthma   . Hyperlipidemia   . Thyroid nodule 01/2009    left discovered on chest ct; needle bx "indeterminant" cannon r/o follicular neoplasm    Past Surgical History  Procedure Laterality Date  . Thyroid needle bx    . Mouth surgery    . Tonsillectomy     Family History  Problem Relation Age of Onset  . Arthritis Mother   . Other Mother     neurological disorder  . Pancreatic cancer Mother   . Heart disease Father   . Thyroid nodules Sister    . Heart disease Brother    History  Substance Use Topics  . Smoking status: Former Smoker    Quit date: 04/29/1971  . Smokeless tobacco: Never Used  . Alcohol Use: Yes     Comment: occ   OB History   Grav Para Term Preterm Abortions TAB SAB Ect Mult Living   1 0   1  1   0     Review of Systems  Constitutional: Negative for fever, chills and diaphoresis.  Respiratory: Positive for cough, shortness of breath and wheezing.   Cardiovascular: Negative for chest pain and palpitations.  Gastrointestinal: Negative for nausea and vomiting.  All other systems reviewed and are negative.    Allergies  Review of patient's allergies indicates no known allergies.  Home Medications   Current Outpatient Rx  Name  Route  Sig  Dispense  Refill  . albuterol (PROAIR HFA) 108 (90 BASE) MCG/ACT inhaler   Inhalation   Inhale 2 puffs into the lungs every 6 (six) hours as needed for wheezing.   1 Inhaler   2   . aspirin 81 MG tablet   Oral   Take 1 tablet (81 mg total) by mouth daily.         . Cholecalciferol (VITAMIN D3) 2000 UNITS TABS   Oral   Take by mouth.         [name]  Fluticasone-Salmeterol (ADVAIR DISKUS) 100-50 MCG/DOSE AEPB   Inhalation   Inhale 1 puff into the lungs 2 (two) times daily. Or as directed   1 each   11   . Glucosamine HCl-MSM (GLUCOSAMINE-MSM PO)   Oral   Take by mouth. 1500mg  - 1500mg          . loratadine (CLARITIN) 10 MG tablet   Oral   Take 10 mg by mouth daily.         . medroxyPROGESTERone (PROVERA) 10 MG tablet      Take one daily for first ten days of the month   30 tablet   0   . MULTIPLE VITAMIN PO   Oral   Take 1 tablet by mouth 2 (two) times daily. Vitafusion.         . Omega-3 Fatty Acids (FISH OIL) 1200 MG CAPS   Oral   Take 2 capsules by mouth daily. Nature Made         . Probiotic Product (PROBIOTIC DAILY PO)   Oral   Take by mouth. Insync          BP 159/68  Pulse 98  Temp(Src) 97.5 F (36.4 C) (Oral)  Resp  20  SpO2 92% Physical Exam  Nursing note and vitals reviewed. Constitutional: She is oriented to person, place, and time. She appears well-developed and well-nourished. No distress.  HENT:  Head: Normocephalic.  Right Ear: Tympanic membrane normal.  Left Ear: Tympanic membrane normal.  Mouth/Throat: Oropharynx is clear and moist.  Neck: Normal range of motion. Neck supple.  Cardiovascular: Normal rate and regular rhythm.   Pulmonary/Chest: Effort normal. No stridor. No respiratory distress. She has wheezes. She has no rales.  Slight expiratory wheeze  Abdominal: Soft.  Neurological: She is alert and oriented to person, place, and time.  Skin: Skin is warm and dry. No rash noted.  Psychiatric: She has a normal mood and affect. Her behavior is normal.    ED Course  Procedures   DIAGNOSTIC STUDIES: Oxygen Saturation is 96% on room air.  COORDINATION OF CARE:  Nursing notes reviewed. Vital signs reviewed. Initial pt interview and examination performed.   2:07 AM-patient seen and evaluated. The patient is well-appearing no acute distress. Normal respirations and O2 sats. She denies any symptoms of shortness of breath or coughing at this time. I discussed chest x-ray results and review them in the room with patient. No concerning findings. Mild signs of bronchitis. Patient without significant wheezing on exam. Lungs are otherwise clear. She is up ambulating well normally without any increased work of breathing. I did discuss options with patient and significant other for further testing and evaluation of her episode of shortness of breath. This time she does not wish to have any further testing. She does not wish to have any breathing treatments. She was given strict return precautions. She will plan to followup with her primary care provider.    Imaging Review Dg Chest 2 View  04/11/2013   CLINICAL DATA:  Cough  EXAM: CHEST  2 VIEW  COMPARISON:  01/12/2008  FINDINGS: Bilateral linear  opacities consistent with scar or atelectasis. Diffuse interstitial coarsening, likely bronchial thickening. No consolidation or edema. No effusion or pneumothorax. Normal heart size. No acute osseous findings.  IMPRESSION: 1. Negative for consolidation or edema. 2. Bronchitic changes with mild bilateral atelectasis.   Electronically Signed   By: Jorje Guild M.D.   On: 04/11/2013 01:23    MDM   1. Cough  2. Shortness of breath        Martie Lee, PA-C 04/11/13 [phone]

## 2013-06-01 ENCOUNTER — Other Ambulatory Visit: Payer: Self-pay | Admitting: Gynecology

## 2013-06-01 ENCOUNTER — Ambulatory Visit (INDEPENDENT_AMBULATORY_CARE_PROVIDER_SITE_OTHER): Payer: BC Managed Care – PPO | Admitting: Gynecology

## 2013-06-01 ENCOUNTER — Ambulatory Visit (INDEPENDENT_AMBULATORY_CARE_PROVIDER_SITE_OTHER): Payer: BC Managed Care – PPO

## 2013-06-01 DIAGNOSIS — D251 Intramural leiomyoma of uterus: Secondary | ICD-10-CM

## 2013-06-01 DIAGNOSIS — D259 Leiomyoma of uterus, unspecified: Secondary | ICD-10-CM

## 2013-06-01 DIAGNOSIS — N8500 Endometrial hyperplasia, unspecified: Secondary | ICD-10-CM

## 2013-06-01 DIAGNOSIS — D252 Subserosal leiomyoma of uterus: Secondary | ICD-10-CM

## 2013-06-01 DIAGNOSIS — R9389 Abnormal findings on diagnostic imaging of other specified body structures: Secondary | ICD-10-CM

## 2013-06-01 DIAGNOSIS — N8501 Benign endometrial hyperplasia: Secondary | ICD-10-CM

## 2013-06-01 DIAGNOSIS — N852 Hypertrophy of uterus: Secondary | ICD-10-CM

## 2013-06-01 DIAGNOSIS — N950 Postmenopausal bleeding: Secondary | ICD-10-CM

## 2013-06-01 DIAGNOSIS — N8300 Follicular cyst of ovary, unspecified side: Secondary | ICD-10-CM

## 2013-06-01 DIAGNOSIS — K429 Umbilical hernia without obstruction or gangrene: Secondary | ICD-10-CM

## 2013-06-01 LAB — CBC WITH DIFFERENTIAL/PLATELET
Basophils Absolute: 0.1 10*3/uL (ref 0.0–0.1)
Basophils Relative: 1 % (ref 0–1)
Eosinophils Absolute: 0.4 10*3/uL (ref 0.0–0.7)
Eosinophils Relative: 6 % — ABNORMAL HIGH (ref 0–5)
HCT: 39.5 % (ref 36.0–46.0)
Hemoglobin: 13.1 g/dL (ref 12.0–15.0)
Lymphocytes Relative: 26 % (ref 12–46)
Lymphs Abs: 1.9 10*3/uL (ref 0.7–4.0)
MCH: 27.5 pg (ref 26.0–34.0)
MCHC: 33.2 g/dL (ref 30.0–36.0)
MCV: 83 fL (ref 78.0–100.0)
Monocytes Absolute: 0.5 10*3/uL (ref 0.1–1.0)
Monocytes Relative: 7 % (ref 3–12)
Neutro Abs: 4.4 10*3/uL (ref 1.7–7.7)
Neutrophils Relative %: 60 % (ref 43–77)
Platelets: 261 10*3/uL (ref 150–400)
RBC: 4.76 MIL/uL (ref 3.87–5.11)
RDW: 13.9 % (ref 11.5–15.5)
WBC: 7.3 10*3/uL (ref 4.0–10.5)

## 2013-06-01 NOTE — Progress Notes (Signed)
   Patient is a 63 year old who presented to the office today for 3 months followup after having been started on Provera 10 mg daily for 10 days of the month for the past 3 months as a result of endometrial hyperplasia without atypia contributing to her postmenopausal bleeding. She states that despite being on the Provera she will still continued to bleed. See previous notes outlining her sonohysterogram and endometrial biopsy and office visits. She also has been noted to have uterine fibroids.  Ultrasound/sono hysterogram today: Uterus measuring 4.7 x 11.6 x 8.5 cm with endometrial stripe of 8.7 mm. Fibroid uterus appears enlarged from previous scan 3 months ago. She has one large fibroid in particular that measures 71 x 90 mm which displaces endometrium. Endocervical polyp was noted right ovary with limited images and demonstrated an echo free cyst measuring 22 x 17 mm which was avascular. Left ovary was not seen. The cervix was cleansed with Betadine solution and a sterile catheter was introduced into the uterine cavity and saline was instilled. No intracavitary defect was noted that the endometrium was prominent.  Following the sonohysterogram a vigorous endometrial biopsy was obtained in the office and tissues submitted for histological evaluation.  Assessment/plan: #1 postmenopausal bleeding #2 endometrial hyperplasia without atypia on endometrial biopsy 3 months ago status post 3 months of progestational agent. She will be prescribed Megace 40 mg twice a day for 10 days with 3 refills until her surgery. #3 leiomyomatous uteri increasing in size from previous scan. #4 narrow vagina no prior pregnancies. #5 reducible large umbilical hernia will be referred to the general surgeons to see if at time of her total abdominal hysterectomy with bilateral salpingo-oophorectomy concurrently baby repair this large reducible umbilical hernia.  Literature and information on hysterectomy was provided. We will  need to see her the week before her surgery for preoperative examination. We will await the results of endometrial biopsy from today. A CBC will be drawn today as well.

## 2013-06-02 ENCOUNTER — Telehealth: Payer: Self-pay | Admitting: *Deleted

## 2013-06-02 ENCOUNTER — Telehealth: Payer: Self-pay

## 2013-06-02 MED ORDER — MEGESTROL ACETATE 40 MG PO TABS: 40.0000 mg | ORAL_TABLET | Freq: Two times a day (BID) | ORAL | Status: DC

## 2013-06-02 NOTE — Telephone Encounter (Signed)
Pt called stating Rx from OV 06/01/13 Megace 40 mg twice a day for 10 days with 3 refills until her surgery. Rx has been sent, left on pt voicemail rx has been sent.

## 2013-06-02 NOTE — Telephone Encounter (Signed)
error 

## 2013-06-09 ENCOUNTER — Ambulatory Visit (INDEPENDENT_AMBULATORY_CARE_PROVIDER_SITE_OTHER): Payer: BC Managed Care – PPO | Admitting: General Surgery

## 2013-06-09 ENCOUNTER — Encounter (INDEPENDENT_AMBULATORY_CARE_PROVIDER_SITE_OTHER): Payer: Self-pay | Admitting: General Surgery

## 2013-06-09 VITALS — BP 146/84 | HR 80 | Resp 18 | Ht 68.0 in | Wt 299.8 lb

## 2013-06-09 DIAGNOSIS — K429 Umbilical hernia without obstruction or gangrene: Secondary | ICD-10-CM

## 2013-06-09 NOTE — Progress Notes (Signed)
Patient ID: Amy Stokes, female   DOB: January 17, 1951, 63 y.o.   MRN: [id number]  No chief complaint on file.   HPI Amy Stokes is a 63 y.o. female.   HPI  She is referred by Dr. Toney Rakes because of an umbilical hernia. This has been long-standing and has slowly been getting larger. It does not spontaneously reduce. No obstruction symptoms. Minimal discomfort from this. She has some endometrial hyperplasia and bleeding that has not been able to be controlled medically. Dr. Toney Rakes is planning on doing a complete hysterectomy and wonders if we can do both procedures at the same time.  Past Medical History  Diagnosis Date  . Hypertension   . Obesity   . Fibroids   . Edema   . Asthma   . Hyperlipidemia   . Thyroid nodule 01/2009    left discovered on chest ct; needle bx "indeterminant" cannon r/o follicular neoplasm     Past Surgical History  Procedure Laterality Date  . Thyroid needle bx    . Mouth surgery    . Tonsillectomy      Family History  Problem Relation Age of Onset  . Arthritis Mother   . Other Mother     neurological disorder  . Pancreatic cancer Mother   . Heart disease Father   . Thyroid nodules Sister   . Heart disease Brother     Social History History  Substance Use Topics  . Smoking status: Former Smoker    Quit date: 04/29/1971  . Smokeless tobacco: Never Used  . Alcohol Use: Yes     Comment: occ    No Known Allergies  Current Outpatient Prescriptions  Medication Sig Dispense Refill  . albuterol (PROAIR HFA) 108 (90 BASE) MCG/ACT inhaler Inhale 2 puffs into the lungs every 6 (six) hours as needed for wheezing.  1 Inhaler  2  . Fluticasone-Salmeterol (ADVAIR DISKUS) 100-50 MCG/DOSE AEPB Inhale 1 puff into the lungs 2 (two) times daily. Or as directed  1 each  11  . megestrol (MEGACE) 40 MG tablet Take 1 tablet (40 mg total) by mouth 2 (two) times daily.  20 tablet  3  . MULTIPLE VITAMIN PO Take 1 tablet by mouth 2 (two)  times daily. Vitafusion.      . Probiotic Product (PROBIOTIC DAILY PO) Take by mouth. Insync      . aspirin 81 MG tablet Take 1 tablet (81 mg total) by mouth daily.       No current facility-administered medications for this visit.    Review of Systems Review of Systems  Constitutional: Negative.   HENT: Negative.   Respiratory: Positive for wheezing.   Cardiovascular: Negative.   Gastrointestinal: Negative.   Genitourinary: Positive for vaginal bleeding.  Neurological: Negative.   Hematological: Negative.     Blood pressure 146/84, pulse 80, resp. rate 18, height 5\' 8"  (1.727 m), weight 299 lb 12.8 oz (135.988 kg).  Physical Exam Physical Exam  Constitutional: No distress.  Obese female.  HENT:  Head: Atraumatic.  2 cm superficial soft tissue lesion right forehead.  Eyes: EOM are normal. No scleral icterus.  Cardiovascular: Normal rate and regular rhythm.   Pulmonary/Chest: Effort normal and breath sounds normal.  Abdominal: Soft. She exhibits no distension. There is no tenderness.  Moderate to large nonreducible umbilical hernia. No tenderness.  Musculoskeletal: She exhibits edema.  Neurological: She is alert.  Skin: Skin is warm and dry.    Data Reviewed Note from Dr. Toney Rakes.  Assessment  1. Chronic incarcerated enlarging umbilical hernia.  2. Endometrial hyperplasia with bleeding.     Plan    I feel it would be okay to repair the umbilical hernia at the same time as the hysterectomy. I would want to use a separate subumbilical incision.  I have discussed the procedure, risks, and aftercare.  Risks include but are not limited to bleeding, infection, wound problems, anesthesia, recurrence, injury to intestine, mesh problems.  We also discussed not knowing what the umbilicus would look like in the future.  She seems to understand.  I will communicate this to Dr. Toney Rakes and we will coordinate scheduling.       ROSENBOWER,TODD J 06/09/2013, 2:18  PM

## 2013-06-09 NOTE — Patient Instructions (Signed)
I have discussed the procedure, risks, and aftercare.  Risks include but are not limited to bleeding, infection, wound problems, anesthesia, recurrence, injury to intestine, mesh problems.  We also discussed not knowing what the umbilicus would look like in the future.  She seems to understand and agrees with the plan.

## 2013-06-10 ENCOUNTER — Telehealth: Payer: Self-pay

## 2013-06-10 NOTE — Telephone Encounter (Signed)
I contacted patient and review insurance benefits and estimated [facility] surgery pre-payment. She is ready to schedule as soon as we can.  I placed a call to [facility] and left message with Dr. Bertrum Sol scheduler. She does have the order for surgery and said she will call me first of next week to coordinate surgery for this patient.

## 2013-06-20 ENCOUNTER — Telehealth: Payer: Self-pay

## 2013-06-20 NOTE — Telephone Encounter (Signed)
I contacted patient to let her know I had coordinated her surgery with Dr. Bertrum Sol scheduler for 08/02/13 at 12:00pm at [facility].  Pre Op consult with Dr. Moshe Salisbury will be scheduled.

## 2013-07-08 ENCOUNTER — Telehealth: Payer: Self-pay

## 2013-07-08 NOTE — Telephone Encounter (Signed)
Dr. Moshe Salisbury-  I realized that I reported her 06/01/13 Hgb. Incorrectly to you.  It was actually 13.1.  Please let me know how that affects your recommendations. Sorry about that.

## 2013-07-08 NOTE — Telephone Encounter (Signed)
Patient is scheduled for TAH, BSO and hernia repair on 08/02/13.  She called today stating she would like to do autolog[employer] blood donation for her surgery.  Please advise.  (Hgb was 11.8 on her 06/01/13 CBC.)

## 2013-07-08 NOTE — Telephone Encounter (Signed)
Please inform patient that she will not need it. Tell her to begin taking iron tablet 1 tablet twice a day. Her hemoglobin is too low. The likelihood of of her needing a transfusion for this surgery is very low.

## 2013-07-08 NOTE — Telephone Encounter (Signed)
I just noticed that. She does not need to go on iron then

## 2013-07-12 NOTE — Telephone Encounter (Signed)
Left detailed message on voicemail.  

## 2013-07-17 ENCOUNTER — Other Ambulatory Visit: Payer: Self-pay | Admitting: Gynecology

## 2013-07-25 ENCOUNTER — Encounter (HOSPITAL_COMMUNITY): Payer: Self-pay | Admitting: Pharmacist

## 2013-07-26 ENCOUNTER — Ambulatory Visit (INDEPENDENT_AMBULATORY_CARE_PROVIDER_SITE_OTHER): Payer: BC Managed Care – PPO | Admitting: Gynecology

## 2013-07-26 ENCOUNTER — Telehealth: Payer: Self-pay

## 2013-07-26 ENCOUNTER — Encounter: Payer: Self-pay | Admitting: Gynecology

## 2013-07-26 ENCOUNTER — Other Ambulatory Visit: Payer: Self-pay

## 2013-07-26 ENCOUNTER — Telehealth: Payer: Self-pay | Admitting: Internal Medicine

## 2013-07-26 VITALS — BP 142/78

## 2013-07-26 DIAGNOSIS — Z01818 Encounter for other preprocedural examination: Secondary | ICD-10-CM

## 2013-07-26 DIAGNOSIS — N950 Postmenopausal bleeding: Secondary | ICD-10-CM

## 2013-07-26 DIAGNOSIS — K429 Umbilical hernia without obstruction or gangrene: Secondary | ICD-10-CM

## 2013-07-26 DIAGNOSIS — N8501 Benign endometrial hyperplasia: Secondary | ICD-10-CM

## 2013-07-26 DIAGNOSIS — N8500 Endometrial hyperplasia, unspecified: Secondary | ICD-10-CM

## 2013-07-26 DIAGNOSIS — Z1231 Encounter for screening mammogram for malignant neoplasm of breast: Secondary | ICD-10-CM

## 2013-07-26 DIAGNOSIS — D259 Leiomyoma of uterus, unspecified: Secondary | ICD-10-CM

## 2013-07-26 MED ORDER — METOCLOPRAMIDE HCL 10 MG PO TABS: 10.0000 mg | ORAL_TABLET | Freq: Three times a day (TID) | ORAL | Status: DC

## 2013-07-26 MED ORDER — HYDROCODONE-ACETAMINOPHEN 5-325 MG PO TABS: 1.0000 | ORAL_TABLET | Freq: Four times a day (QID) | ORAL | Status: DC | PRN

## 2013-07-26 NOTE — Telephone Encounter (Signed)
Juliann Pulse from [facility] gynecologist is calling in regards to pt needing a pre-op clearance for surgery that is scheduled 08/02/13, Dr. Toney Rakes realized that the pt had not been seen since 2014 and requesting a ov with primary before surgery. OK to use sda on 07/27/13.

## 2013-07-26 NOTE — Telephone Encounter (Signed)
Dr. Toney Rakes asked me to try to schedule appt with her PCP Dr. Regis Bill prior to surgery next week (for pre op clearance.). Gwen at Dr. Velora Mediate office called back and was able to fit her in on Thursday, 07/28/13 at 11:00am.  Patient was notified.

## 2013-07-26 NOTE — Telephone Encounter (Signed)
appt was scheduled for pt 

## 2013-07-26 NOTE — Telephone Encounter (Signed)
Use the 11am slot on Thursday.  Thanks!

## 2013-07-26 NOTE — Progress Notes (Addendum)
Amy Stokes is an 63 y.o. female. Who presented to the office for her preoperative examination. Patient was seen in the office on March 4 for her three-month followup after having been on Provera 10 mg for 10 days of each of the prior 3 months. She had reported that she continued to bleed despite being on Provera. Her endometrial biopsy before starting the Provera demonstrated simple hyperplasia without atypia.  Ultrasound/sono hysterogram today:  Uterus measuring  12.0 x 11 x 8.5 cm with endometrial stripe of 8.7 mm. Fibroid uterus appears enlarged from previous scan 3 months ago. She has one large fibroid in particular that measures 71 x 90 mm which displaces endometrium. Endocervical polyp was noted right ovary with limited images and demonstrated an echo free cyst measuring 22 x 17 mm which was avascular. Left ovary was not seen. The cervix was cleansed with Betadine solution and a sterile catheter was introduced into the uterine cavity and saline was instilled. No intracavitary defect was noted that the endometrium was prominent.    followup endometrial biopsy on 06/01/2013 after 3 months of Provera demonstrated the following: Diagnosis Endometrium, biopsy - PROLIFERATIVE-TYPE ENDOMETRIUM WITH BREAKDOWN. - THERE IS NO EVIDENCE OF HYPERPLASIA OR MALIGNANCY. - SEE COMMENT. Microscopic Comment Sections show fragments of benign proliferative-type endometrium in which there are foci of breakdown with fragmented glands and condensed stroma. The features suggest anovulatory or estrogen withdrawal Bleeding.  Patient has been kept on Megace 40 mg twice a day because she continued to have postmenopausal bleeding despite normal endometrial biopsy. Patient also with chronic enlarging incarcerated umbilical hernia for which she was referred to Dr. Zella Richer who will repair after I performed her total abdominal hysterectomy with bilateral salpingo-oophorectomy. The patient has not seen her PCP and  ovary year. We'll try to get her in to see her before surgery. Patient was normal Pap smear less than one year ago. Patient with no prior history of abnormal Pap smears.   Pertinent Gynecological History: Menses: post-menopausal Bleeding: post menopausal bleeding Contraception: post menopausal status DES exposure: denies Blood transfusions: none Sexually transmitted diseases: HSV Previous GYN Procedures: cryo  Last mammogram: normal Date: 2003 Last pap: normal Date: 2014 OB History: G1, P0A1   Menstrual History: Menarche age: 51  No LMP recorded. Patient is postmenopausal.    Past Medical History  Diagnosis Date  . Hypertension   . Obesity   . Fibroids   . Edema   . Asthma   . Hyperlipidemia   . Thyroid nodule 01/2009    left discovered on chest ct; needle bx "indeterminant" cannon r/o follicular neoplasm     Past Surgical History  Procedure Laterality Date  . Thyroid needle bx    . Mouth surgery    . Tonsillectomy      Family History  Problem Relation Age of Onset  . Arthritis Mother   . Other Mother     neurological disorder  . Pancreatic cancer Mother   . Heart disease Father   . Thyroid nodules Sister   . Heart disease Brother     Social History:  reports that she quit smoking about 42 years ago. She has never used smokeless tobacco. She reports that she drinks alcohol. She reports that she does not use illicit drugs.  Allergies: No Known Allergies   (Not in a hospital admission)  REVIEW OF SYSTEMS: A ROS was performed and pertinent positives and negatives are included in the history.  GENERAL: No fevers or chills. HEENT: No  change in vision, no earache, sore throat or sinus congestion. NECK: No pain or stiffness. CARDIOVASCULAR: No chest pain or pressure. No palpitations. PULMONARY: No shortness of breath, cough or wheeze. GASTROINTESTINAL: umbilical hernia GENITOURINARY: No urinary frequency, urgency, hesitancy or dysuria. MUSCULOSKELETAL: No joint or  muscle pain, no back pain, no recent trauma. DERMATOLOGIC: No rash, no itching, no lesions. ENDOCRINE: No polyuria, polydipsia, no heat or cold intolerance. No recent change in weight. HEMATOLOGICAL: post menopausal bleeding. NEUROLOGIC: No headache, seizures, numbness, tingling or weakness. PSYCHIATRIC: No depression, no loss of interest in normal activity or change in sleep pattern.     Blood pressure 142/78.  Physical Exam:  HEENT:unremarkable Neck:Supple, midline, no thyroid megaly, no carotid bruits Lungs:  Clear to auscultation no rhonchi's or wheezes Heart:Regular rate and rhythm, no murmurs or gallops Breast Exam: both breasts were examined sitting supine position both breasts are symmetrical in appearance no palpable mass or tenderness no supraclavicular axillary lymphadenopathy Abdomen: Pendulous abdomen, large umbilical hernia Pelvic:BUS within normal limits Vagina: Within normal limits slightly atrophic Cervix: No abnormalities Uterus: 10-12 weeks size irregular uterus Adnexa: Difficult to examine due to patient's abdominal girth and size of uterus Extremities: No cords, no edema, signs of vascular insufficiency Rectal: Not done   Assessment/Plan: Patient with persistent postmenopausal bleeding with evidence of endometrial hyperplasia was treated with progestational agent for 3 months. Followup endometrial biopsy no evidence of endometrial hyperplasia the patient continued to bleed. Patient currently on Megace 40 mg twice a day until time of surgery. Patient with enlarging fibroid uterus. Patient also with large umbilical hernia. Patient will be scheduled for total abdominal hysterectomy with bilateral salpingo-oophorectomy and general surgeon will concurrently 2 umbilical hernia repair. We're going to try to get the patient to see her PCP Dr. Regis Bill before her surgery. The following risk factors were discussed:                        Patient was counseled as to the risk of  surgery to include the following:  1. Infection (prohylactic antibiotics will be administered)  2. DVT/Pulmonary Embolism (prophylactic pneumo compression stockings will be used)  3.Trauma to internal organs requiring additional surgical procedure to repair any injury to     Internal organs requiring perhaps additional hospitalization days.  4.Hemmorhage requiring transfusion and blood products which carry risks such as anaphylactic reaction, hepatitis and AIDS  Patient had received literature information on the procedure scheduled and all her questions were answered and fully accepts all risk.   Starlyn Skeans FernandezMD9:55 AMTD@Note : This dictation was prepared with  Dragon/digital dictation along withSmart phrase technology. Any transcriptional errors that result from this process are unintentional.      Terrance Mass 07/26/2013, 9:30 AM  Note: This dictation was prepared with  Dragon/digital dictation along withSmart phrase technology. Any transcriptional errors that result from this process are unintentional.

## 2013-07-28 ENCOUNTER — Ambulatory Visit
Admission: RE | Admit: 2013-07-28 | Discharge: 2013-07-28 | Disposition: A | Discharge status: Home / Self Care | Payer: BC Managed Care – PPO | Source: Ambulatory Visit

## 2013-07-28 ENCOUNTER — Ambulatory Visit: Payer: Self-pay

## 2013-07-28 ENCOUNTER — Encounter: Payer: Self-pay | Admitting: Internal Medicine

## 2013-07-28 ENCOUNTER — Ambulatory Visit (INDEPENDENT_AMBULATORY_CARE_PROVIDER_SITE_OTHER): Payer: BC Managed Care – PPO | Admitting: Internal Medicine

## 2013-07-28 VITALS — BP 146/72 | Temp 98.9°F | Ht 68.25 in | Wt 296.0 lb

## 2013-07-28 DIAGNOSIS — I10 Essential (primary) hypertension: Secondary | ICD-10-CM

## 2013-07-28 DIAGNOSIS — Z23 Encounter for immunization: Secondary | ICD-10-CM

## 2013-07-28 DIAGNOSIS — N8500 Endometrial hyperplasia, unspecified: Secondary | ICD-10-CM

## 2013-07-28 DIAGNOSIS — Z01818 Encounter for other preprocedural examination: Secondary | ICD-10-CM

## 2013-07-28 DIAGNOSIS — J45909 Unspecified asthma, uncomplicated: Secondary | ICD-10-CM

## 2013-07-28 DIAGNOSIS — Z1231 Encounter for screening mammogram for malignant neoplasm of breast: Secondary | ICD-10-CM

## 2013-07-28 DIAGNOSIS — K429 Umbilical hernia without obstruction or gangrene: Secondary | ICD-10-CM

## 2013-07-28 DIAGNOSIS — E041 Nontoxic single thyroid nodule: Secondary | ICD-10-CM

## 2013-07-28 LAB — LIPID PANEL
Cholesterol: 169 mg/dL (ref 0–200)
HDL: 28.6 mg/dL — ABNORMAL LOW (ref >39.00)
LDL Cholesterol: 129 mg/dL — ABNORMAL HIGH (ref 0–99)
Total CHOL/HDL Ratio: 6
Triglycerides: 58 mg/dL (ref 0.0–149.0)
VLDL: 11.6 mg/dL (ref 0.0–40.0)

## 2013-07-28 LAB — BASIC METABOLIC PANEL
BUN: 12 mg/dL (ref 6–23)
CO2: 30 mEq/L (ref 19–32)
Calcium: 9 mg/dL (ref 8.4–10.5)
Chloride: 102 mEq/L (ref 96–112)
Creatinine, Ser: 0.6 mg/dL (ref 0.4–1.2)
GFR: 101.66 mL/min (ref >60.00)
Glucose, Bld: 89 mg/dL (ref 70–99)
Potassium: 4.3 mEq/L (ref 3.5–5.1)
Sodium: 139 mEq/L (ref 135–145)

## 2013-07-28 LAB — CBC WITH DIFFERENTIAL/PLATELET
Basophils Absolute: 0 10*3/uL (ref 0.0–0.1)
Basophils Relative: 0.5 % (ref 0.0–3.0)
Eosinophils Absolute: 0.3 10*3/uL (ref 0.0–0.7)
Eosinophils Relative: 4.3 % (ref 0.0–5.0)
HCT: 41.1 % (ref 36.0–46.0)
Hemoglobin: 13.4 g/dL (ref 12.0–15.0)
Lymphocytes Relative: 21.2 % (ref 12.0–46.0)
Lymphs Abs: 1.5 10*3/uL (ref 0.7–4.0)
MCHC: 32.6 g/dL (ref 30.0–36.0)
MCV: 85.3 fl (ref 78.0–100.0)
Monocytes Absolute: 0.4 10*3/uL (ref 0.1–1.0)
Monocytes Relative: 6.1 % (ref 3.0–12.0)
Neutro Abs: 4.8 10*3/uL (ref 1.4–7.7)
Neutrophils Relative %: 67.9 % (ref 43.0–77.0)
Platelets: 227 10*3/uL (ref 150.0–400.0)
RBC: 4.82 Mil/uL (ref 3.87–5.11)
RDW: 14.4 % (ref 11.5–14.6)
WBC: 7 10*3/uL (ref 4.5–10.5)

## 2013-07-28 LAB — HEPATIC FUNCTION PANEL
ALT: 25 U/L (ref 0–35)
AST: 18 U/L (ref 0–37)
Albumin: 3.5 g/dL (ref 3.5–5.2)
Alkaline Phosphatase: 66 U/L (ref 39–117)
Bilirubin, Direct: 0.1 mg/dL (ref 0.0–0.3)
Total Bilirubin: 1 mg/dL (ref 0.3–1.2)
Total Protein: 6.9 g/dL (ref 6.0–8.3)

## 2013-07-28 LAB — TSH: TSH: 2.48 u[IU]/mL (ref 0.35–5.50)

## 2013-07-28 MED ORDER — LOSARTAN POTASSIUM 100 MG PO TABS: 50.0000 mg | ORAL_TABLET | Freq: Every day | ORAL | Status: DC

## 2013-07-28 MED ORDER — FLUTICASONE-SALMETEROL 100-50 MCG/DOSE IN AEPB: 1.0000 | INHALATION_SPRAY | Freq: Two times a day (BID) | RESPIRATORY_TRACT | Status: DC

## 2013-07-28 NOTE — Patient Instructions (Signed)
Refill advair . restart the cozaar for your hypertension , can begin with 50 mg and increase to 100 mg if needed after 3-4 weeks.  Plan fu thyroid nodule after you recover from surgery.  Should have repeat ultrasound of thyroid and then recheck and plan. May need re biopsy vs other.  Will send note to dr Toney Rakes. Plan ROV in 2-3 months or as needed

## 2013-07-28 NOTE — Patient Instructions (Signed)
   Your procedure is scheduled on:  Tuesday, May 5  Enter through the Main Entrance of [facility] at:  [place] up the phone at the desk and dial [phone] and inform us of your arrival.  Please call this number if you have any problems the morning of surgery: [phone]  Remember: Do not eat food after midnight: Monday Do not drink clear liquids after: 8 AM Tuesday, day of surgery Take these medicines the morning of surgery with a SIP OF WATER:  Do not wear jewelry, make-up, or FINGER nail polish No metal in your hair or on your body. Do not wear lotions, powders, perfumes.  You may wear deodorant.  Do not bring valuables to the hospital. Contacts, dentures or bridgework may not be worn into surgery.  Leave suitcase in the car. After Surgery it may be brought to your room. For patients being admitted to the hospital, checkout time is 11:00am the day of discharge.

## 2013-07-28 NOTE — Progress Notes (Signed)
Chief Complaint  Patient presents with  . Medical Clearance    complete hyst and umbi repair may5th     HPI:  Pt comesin today requesting preop evaluation for  [place]  For fibroids emdometrial hyperplasia and umbilical repair per dr Toney Rakes .  She has  Had under or no insuance int het past years    Hx of asthmal ast seen July 2014   Fine with advair at this time .   Had  Chest cold and wheezing got worse and went to ed and  X ray was ok . Using [employer]   Rare use of other . Well managed.   No hxof sleep apnea but does snore.   No bleeding on megace .   Ht : little  high 140  Borderline has been off losartan because she wanted  to try on own.  Large thyroid nodule that showed indeterminant  Cannot r/o neoplasm and surgery was advised but didn't  proceed cause of cost .  2010  Not bothering her   No hx of clotting bleeding  acteve cardiovascular disease .   Health Maintenance  Topic Date Due  . Mammogram  03/30/1969  . Zostavax  03/31/2011  . Influenza Vaccine  10/29/2013  . Pap Smear  02/10/2014  . Colonoscopy  12/26/2017  . Tetanus/tdap  07/29/2023   Health Maintenance Review   ROS:  GEN/ HEENT: No fever, significant weight changes sweats headaches vision problems hearing changes, CV/ PULM; No chest pain shortness of breath cough, syncope,edema  change in exercise tolerance. GI /GU: No adominal pain, vomiting, change in bowel habits. No blood in the stool. No significant GU symptoms. SKIN/HEME: ,no acute skin rashes suspicious lesions or bleeding. No lymphadenopathy, nodules, masses.  NEURO/ PSYCH:  No neurologic signs such as weakness numbness. No depression anxiety. IMM/ Allergy: No unusual infections.  Allergy .   REST of 12 system review negative except as per HPI   Past Medical History  Diagnosis Date  . Hypertension   . Obesity   . Fibroids   . Edema   . Asthma   . Hyperlipidemia   . Thyroid nodule 01/2009    left discovered on chest ct;  needle bx "indeterminant" cannon r/o follicular neoplasm     Family History  Problem Relation Age of Onset  . Arthritis Mother   . Other Mother     neurological disorder  . Pancreatic cancer Mother   . Heart disease Father   . Thyroid nodules Sister   . Heart disease Brother     History   Social History  . Marital Status: Married    Spouse Name: N/A    Number of Children: N/A  . Years of Education: N/A   Social History Main Topics  . Smoking status: Former Smoker    Quit date: 04/29/1971  . Smokeless tobacco: Never Used  . Alcohol Use: Yes     Comment: occ  . Drug Use: No  . Sexual Activity: No   Other Topics Concern  . None   Social History Narrative   Married   Occupation: Chiropractor currently unemployed   Married   Regular exercise-no   [place] of 2   Pet cat          Outpatient Encounter Prescriptions as of 07/28/2013  Medication Sig  . albuterol (PROAIR HFA) 108 (90 BASE) MCG/ACT inhaler Inhale 2 puffs into the lungs every 6 (six) hours as needed for wheezing.  . Fluticasone-Salmeterol (ADVAIR  DISKUS) 100-50 MCG/DOSE AEPB Inhale 1 puff into the lungs 2 (two) times daily. Or as directed  . HYDROcodone-acetaminophen (NORCO/VICODIN) 5-325 MG per tablet Take 1 tablet by mouth every 6 (six) hours as needed.  . megestrol (MEGACE) 40 MG tablet TAKE 1 TABLET BY MOUTH TWICE A DAY  . metoCLOPramide (REGLAN) 10 MG tablet Take 1 tablet (10 mg total) by mouth 3 (three) times daily with meals.  . MULTIPLE VITAMIN PO Take 1 tablet by mouth 2 (two) times daily. Vitafusion.  . Probiotic Product (PROBIOTIC DAILY PO) Take 1 tablet by mouth daily. Insync  . [DISCONTINUED] Fluticasone-Salmeterol (ADVAIR DISKUS) 100-50 MCG/DOSE AEPB Inhale 1 puff into the lungs 2 (two) times daily. Or as directed  . losartan (COZAAR) 100 MG tablet Take 0.5-1 tablets (50-100 mg total) by mouth daily. For hypertension    EXAM:  BP 146/72  Temp(Src) 98.9 F (37.2 C) (Oral)  Ht 5' 8.25"  (1.734 m)  Wt 296 lb (134.265 kg)  BMI 44.65 kg/m2  Body mass index is 44.65 kg/(m^2).  Physical Exam: Vital signs reviewed UXN:ATFT is a well-developed well-nourished alert cooperative    who appearsr stated age in no acute distress.  HEENT: normocephalic atraumatic , Eyes: PERRL EOM's full, conjunctiva clear, Nares: paten,t no deformity discharge or tenderness., Ears: no deformity EAC's clear TMs with normal landmarks. Mouth: clear OP, no lesions, edema. Low plate ok airway   Moist mucous membranes. Dentition in adequate repair. NECK: supple withour bruits. Thyroid palpable  CHEST/PULM:  Clear to auscultation and percussion breath sounds equal no wheeze , rales or rhonchi. No chest wall deformities or tenderness. CV: PMI is nondisplaced, S1 S2 no gallops, murmurs, rubs. Peripheral pulses are full without delay.No JVD .  ABDOMEN: Bowel sounds normal nontender  No guard or rebound, no hepato splenomegal no CVA tenderness.  Umbilical hernia non tender  3-4 cm  Extremtities:  No clubbing cyanosis or edema, no acute joint swelling or redness no focal atrophy NEURO:  Oriented x3, cranial nerves 3-12 appear to be intact, no obvious focal weakness,gait within normal limits  SKIN: No acute rashes normal turgor, color, no bruising or petechiae. PSYCH: Oriented, good eye contact, no obvious depression anxiety, cognition and judgment appear normal. LN: no cervicaladenopathy  Lab Results  Component Value Date   WBC 7.0 07/28/2013   HGB 13.4 07/28/2013   HCT 41.1 07/28/2013   PLT 227.0 07/28/2013   GLUCOSE 89 07/28/2013   CHOL 169 07/28/2013   TRIG 58.0 07/28/2013   HDL 28.60* 07/28/2013   LDLDIRECT 141.7 04/03/2011   LDLCALC 129* 07/28/2013   ALT 25 07/28/2013   AST 18 07/28/2013   NA 139 07/28/2013   K 4.3 07/28/2013   CL 102 07/28/2013   CREATININE 0.6 07/28/2013   BUN 12 07/28/2013   CO2 30 07/28/2013   TSH 2.48 07/28/2013    ASSESSMENT AND PLAN:  Discussed the following assessment and  plan:  Endometrial hyperplasia - Plan: Basic metabolic panel, CBC with Differential, Hepatic function panel, Lipid panel, TSH  UMBILICAL HERNIA - Plan: Basic metabolic panel, CBC with Differential, Hepatic function panel, Lipid panel, TSH  Pre-op examination - Plan: Basic metabolic panel, CBC with Differential, Hepatic function panel, Lipid panel, TSH  Unspecified asthma(493.90) - Plan: Basic metabolic panel, CBC with Differential, Hepatic function panel, Lipid panel, TSH  Need for Tdap vaccination - Plan: Tdap vaccine greater than or equal to 7yo IM, Basic metabolic panel, CBC with Differential, Hepatic function panel, Lipid panel, TSH  Unspecified essential  hypertension - Plan: Basic metabolic panel, CBC with Differential, Hepatic function panel, Lipid panel, TSH  THYROID NODULE - Plan: Basic metabolic panel, CBC with Differential, Hepatic function panel, Lipid panel, TSH  Morbid obesity Acceptable risk for surgery no contraindications at this time.  Restart her blood pressure medication. Plan followup after recovery to follow through on the thyroid nodule and hypertension. Patient Care Team: Burnis Medin, MD as PCP - General Patient Instructions  Refill advair . restart the cozaar for your hypertension , can begin with 50 mg and increase to 100 mg if needed after 3-4 weeks.  Plan fu thyroid nodule after you recover from surgery.  Should have repeat ultrasound of thyroid and then recheck and plan. May need re biopsy vs other.  Will send note to dr Toney Rakes. Plan ROV in 2-3 months or as needed   [name]. Panosh M.D.

## 2013-07-29 ENCOUNTER — Encounter (HOSPITAL_COMMUNITY)
Admission: RE | Admit: 2013-07-29 | Discharge: 2013-07-29 | Disposition: A | Discharge status: Home / Self Care | Payer: BC Managed Care – PPO | Source: Ambulatory Visit | Attending: Gynecology | Admitting: Gynecology

## 2013-07-29 ENCOUNTER — Encounter (HOSPITAL_COMMUNITY): Payer: Self-pay

## 2013-07-29 DIAGNOSIS — Z01818 Encounter for other preprocedural examination: Secondary | ICD-10-CM | POA: Insufficient documentation

## 2013-07-29 DIAGNOSIS — Z01812 Encounter for preprocedural laboratory examination: Secondary | ICD-10-CM | POA: Insufficient documentation

## 2013-07-29 DIAGNOSIS — Z01810 Encounter for preprocedural cardiovascular examination: Secondary | ICD-10-CM | POA: Insufficient documentation

## 2013-07-29 LAB — TYPE AND SCREEN
ABO/RH(D): O POS
Antibody Screen: NEGATIVE

## 2013-07-29 LAB — ABO/RH: ABO/RH(D): O POS

## 2013-08-01 ENCOUNTER — Other Ambulatory Visit: Payer: Self-pay | Admitting: Internal Medicine

## 2013-08-01 DIAGNOSIS — R928 Other abnormal and inconclusive findings on diagnostic imaging of breast: Secondary | ICD-10-CM

## 2013-08-02 ENCOUNTER — Encounter (HOSPITAL_COMMUNITY): Payer: BC Managed Care – PPO | Admitting: Anesthesiology

## 2013-08-02 ENCOUNTER — Encounter (HOSPITAL_COMMUNITY)
Admission: RE | Disposition: A | Discharge status: Home / Self Care | Payer: Self-pay | Source: Ambulatory Visit | Attending: Gynecology

## 2013-08-02 ENCOUNTER — Inpatient Hospital Stay (HOSPITAL_COMMUNITY): Payer: BC Managed Care – PPO | Admitting: Anesthesiology

## 2013-08-02 ENCOUNTER — Encounter (HOSPITAL_COMMUNITY): Payer: Self-pay | Admitting: Anesthesiology

## 2013-08-02 ENCOUNTER — Inpatient Hospital Stay (HOSPITAL_COMMUNITY)
Admission: RE | Admit: 2013-08-02 | Discharge: 2013-08-04 | DRG: 742 | Disposition: A | Discharge status: Home / Self Care | Payer: BC Managed Care – PPO | Source: Ambulatory Visit | Attending: Gynecology | Admitting: Gynecology

## 2013-08-02 DIAGNOSIS — D252 Subserosal leiomyoma of uterus: Secondary | ICD-10-CM | POA: Diagnosis present

## 2013-08-02 DIAGNOSIS — Z800 Family history of malignant neoplasm of digestive organs: Secondary | ICD-10-CM

## 2013-08-02 DIAGNOSIS — I10 Essential (primary) hypertension: Secondary | ICD-10-CM | POA: Diagnosis present

## 2013-08-02 DIAGNOSIS — K420 Umbilical hernia with obstruction, without gangrene: Secondary | ICD-10-CM | POA: Diagnosis present

## 2013-08-02 DIAGNOSIS — N841 Polyp of cervix uteri: Secondary | ICD-10-CM | POA: Diagnosis present

## 2013-08-02 DIAGNOSIS — E785 Hyperlipidemia, unspecified: Secondary | ICD-10-CM | POA: Diagnosis present

## 2013-08-02 DIAGNOSIS — Z6841 Body Mass Index (BMI) 40.0 and over, adult: Secondary | ICD-10-CM

## 2013-08-02 DIAGNOSIS — Z8249 Family history of ischemic heart disease and other diseases of the circulatory system: Secondary | ICD-10-CM

## 2013-08-02 DIAGNOSIS — N950 Postmenopausal bleeding: Principal | ICD-10-CM | POA: Diagnosis present

## 2013-08-02 DIAGNOSIS — N840 Polyp of corpus uteri: Secondary | ICD-10-CM | POA: Diagnosis present

## 2013-08-02 DIAGNOSIS — D259 Leiomyoma of uterus, unspecified: Secondary | ICD-10-CM

## 2013-08-02 DIAGNOSIS — N838 Other noninflammatory disorders of ovary, fallopian tube and broad ligament: Secondary | ICD-10-CM | POA: Diagnosis present

## 2013-08-02 DIAGNOSIS — D251 Intramural leiomyoma of uterus: Secondary | ICD-10-CM | POA: Diagnosis present

## 2013-08-02 DIAGNOSIS — N8500 Endometrial hyperplasia, unspecified: Secondary | ICD-10-CM | POA: Diagnosis present

## 2013-08-02 DIAGNOSIS — K429 Umbilical hernia without obstruction or gangrene: Secondary | ICD-10-CM

## 2013-08-02 DIAGNOSIS — Z87891 Personal history of nicotine dependence: Secondary | ICD-10-CM

## 2013-08-02 DIAGNOSIS — E041 Nontoxic single thyroid nodule: Secondary | ICD-10-CM | POA: Diagnosis present

## 2013-08-02 DIAGNOSIS — Z9889 Other specified postprocedural states: Secondary | ICD-10-CM

## 2013-08-02 HISTORY — PX: UMBILICAL HERNIA REPAIR: SHX196

## 2013-08-02 HISTORY — PX: ABDOMINAL HYSTERECTOMY: SHX81

## 2013-08-02 LAB — PROTIME-INR
INR: 0.98 (ref 0.00–1.49)
Prothrombin Time: 12.8 seconds (ref 11.6–15.2)

## 2013-08-02 SURGERY — HYSTERECTOMY, ABDOMINAL
Anesthesia: General | Site: Abdomen

## 2013-08-02 MED ORDER — CEFAZOLIN SODIUM 1-5 GM-% IV SOLN
1.0000 g | Freq: Once | INTRAVENOUS | Status: AC
  Administered 2013-08-02: 1 g via INTRAVENOUS
  Filled 2013-08-02: qty 50

## 2013-08-02 MED ORDER — FENTANYL CITRATE 0.05 MG/ML IJ SOLN
INTRAMUSCULAR | Status: AC
  Filled 2013-08-02: qty 5

## 2013-08-02 MED ORDER — KETOROLAC TROMETHAMINE 30 MG/ML IJ SOLN
INTRAMUSCULAR | Status: DC | PRN
  Administered 2013-08-02: 30 mg via INTRAVENOUS

## 2013-08-02 MED ORDER — ENOXAPARIN SODIUM 40 MG/0.4ML ~~LOC~~ SOLN
40.0000 mg | SUBCUTANEOUS | Status: DC
  Administered 2013-08-02 – 2013-08-04 (×3): 40 mg via SUBCUTANEOUS
  Filled 2013-08-02 (×4): qty 0.4

## 2013-08-02 MED ORDER — LACTATED RINGERS IV SOLN
INTRAVENOUS | Status: DC
  Administered 2013-08-02 – 2013-08-03 (×2): via INTRAVENOUS

## 2013-08-02 MED ORDER — HYDROMORPHONE HCL PF 1 MG/ML IJ SOLN
INTRAMUSCULAR | Status: AC
  Filled 2013-08-02: qty 1

## 2013-08-02 MED ORDER — SODIUM CHLORIDE 0.9 % IJ SOLN
INTRAMUSCULAR | Status: AC
  Filled 2013-08-02: qty 50

## 2013-08-02 MED ORDER — BUPIVACAINE-EPINEPHRINE (PF) 0.25% -1:200000 IJ SOLN
INTRAMUSCULAR | Status: AC
  Filled 2013-08-02: qty 30

## 2013-08-02 MED ORDER — LIDOCAINE HCL (CARDIAC) 20 MG/ML IV SOLN
INTRAVENOUS | Status: DC | PRN
  Administered 2013-08-02: 100 mg via INTRAVENOUS

## 2013-08-02 MED ORDER — BUPIVACAINE-EPINEPHRINE (PF) 0.5% -1:200000 IJ SOLN
INTRAMUSCULAR | Status: AC
  Filled 2013-08-02: qty 30

## 2013-08-02 MED ORDER — DIPHENHYDRAMINE HCL 50 MG/ML IJ SOLN: 12.5000 mg | Freq: Four times a day (QID) | INTRAMUSCULAR | Status: DC | PRN

## 2013-08-02 MED ORDER — DEXAMETHASONE SODIUM PHOSPHATE 10 MG/ML IJ SOLN
INTRAMUSCULAR | Status: AC
  Filled 2013-08-02: qty 1

## 2013-08-02 MED ORDER — MOMETASONE FURO-FORMOTEROL FUM 100-5 MCG/ACT IN AERO
2.0000 | INHALATION_SPRAY | Freq: Two times a day (BID) | RESPIRATORY_TRACT | Status: DC
  Administered 2013-08-02 – 2013-08-04 (×4): 2 via RESPIRATORY_TRACT
  Filled 2013-08-02: qty 8.8

## 2013-08-02 MED ORDER — NEOSTIGMINE METHYLSULFATE 10 MG/10ML IV SOLN
INTRAVENOUS | Status: AC
  Filled 2013-08-02: qty 1

## 2013-08-02 MED ORDER — GLYCOPYRROLATE 0.2 MG/ML IJ SOLN
INTRAMUSCULAR | Status: AC
  Filled 2013-08-02: qty 3

## 2013-08-02 MED ORDER — MIDAZOLAM HCL 2 MG/2ML IJ SOLN
INTRAMUSCULAR | Status: AC
  Filled 2013-08-02: qty 2

## 2013-08-02 MED ORDER — SODIUM CHLORIDE 0.9 % IJ SOLN: 9.0000 mL | INTRAMUSCULAR | Status: DC | PRN

## 2013-08-02 MED ORDER — SODIUM CHLORIDE 0.9 % IR SOLN
Status: DC | PRN
  Administered 2013-08-02: 3000 mL

## 2013-08-02 MED ORDER — KETOROLAC TROMETHAMINE 30 MG/ML IJ SOLN
INTRAMUSCULAR | Status: AC
  Filled 2013-08-02: qty 1

## 2013-08-02 MED ORDER — DEXTROSE 5 % IV SOLN: 2.0000 g | INTRAVENOUS | Status: DC

## 2013-08-02 MED ORDER — ONDANSETRON HCL 4 MG/2ML IJ SOLN
4.0000 mg | Freq: Four times a day (QID) | INTRAMUSCULAR | Status: DC | PRN
  Administered 2013-08-02 – 2013-08-04 (×2): 4 mg via INTRAVENOUS
  Filled 2013-08-02 (×2): qty 2

## 2013-08-02 MED ORDER — CEFAZOLIN SODIUM 1 G IJ SOLR: 1.0000 g | Freq: Once | INTRAMUSCULAR | Status: DC

## 2013-08-02 MED ORDER — GLYCOPYRROLATE 0.2 MG/ML IJ SOLN
INTRAMUSCULAR | Status: DC | PRN
  Administered 2013-08-02: 0.4 mg via INTRAVENOUS
  Administered 2013-08-02 (×2): 0.1 mg via INTRAVENOUS

## 2013-08-02 MED ORDER — ALBUTEROL SULFATE (2.5 MG/3ML) 0.083% IN NEBU
3.0000 mL | INHALATION_SOLUTION | Freq: Four times a day (QID) | RESPIRATORY_TRACT | Status: DC | PRN
Start: 2013-08-02 — End: 2013-08-04

## 2013-08-02 MED ORDER — NEOSTIGMINE METHYLSULFATE 10 MG/10ML IV SOLN
INTRAVENOUS | Status: DC | PRN
  Administered 2013-08-02: 3 mg via INTRAVENOUS

## 2013-08-02 MED ORDER — DEXTROSE 5 % IV SOLN
3.0000 g | INTRAVENOUS | Status: AC
  Administered 2013-08-02: 3 g via INTRAVENOUS
  Filled 2013-08-02: qty 3000

## 2013-08-02 MED ORDER — BUPIVACAINE-EPINEPHRINE 0.25% -1:200000 IJ SOLN
INTRAMUSCULAR | Status: DC | PRN
  Administered 2013-08-02: 10 mL

## 2013-08-02 MED ORDER — NALOXONE HCL 0.4 MG/ML IJ SOLN: 0.4000 mg | INTRAMUSCULAR | Status: DC | PRN

## 2013-08-02 MED ORDER — ONDANSETRON HCL 4 MG/2ML IJ SOLN
INTRAMUSCULAR | Status: AC
  Filled 2013-08-02: qty 2

## 2013-08-02 MED ORDER — HYDROCODONE-ACETAMINOPHEN 5-325 MG PO TABS
1.0000 | ORAL_TABLET | Freq: Four times a day (QID) | ORAL | Status: DC | PRN
  Administered 2013-08-03 (×2): 2 via ORAL
  Administered 2013-08-03 (×2): 1 via ORAL
  Filled 2013-08-02 (×2): qty 1
  Filled 2013-08-02 (×2): qty 2

## 2013-08-02 MED ORDER — FENTANYL CITRATE 0.05 MG/ML IJ SOLN
INTRAMUSCULAR | Status: DC | PRN
  Administered 2013-08-02 (×2): 100 ug via INTRAVENOUS
  Administered 2013-08-02: 50 ug via INTRAVENOUS
  Administered 2013-08-02 (×2): 100 ug via INTRAVENOUS
  Administered 2013-08-02: 50 ug via INTRAVENOUS

## 2013-08-02 MED ORDER — DIPHENHYDRAMINE HCL 12.5 MG/5ML PO ELIX: 12.5000 mg | ORAL_SOLUTION | Freq: Four times a day (QID) | ORAL | Status: DC | PRN

## 2013-08-02 MED ORDER — FENTANYL 10 MCG/ML IV SOLN
INTRAVENOUS | Status: DC
  Administered 2013-08-02: 90 ug via INTRAVENOUS
  Administered 2013-08-02: 18:00:00 via INTRAVENOUS
  Administered 2013-08-03: 148.6 ug via INTRAVENOUS
  Administered 2013-08-03 (×2): 120 ug via INTRAVENOUS
  Filled 2013-08-02: qty 50

## 2013-08-02 MED ORDER — HYDROMORPHONE HCL PF 1 MG/ML IJ SOLN
INTRAMUSCULAR | Status: DC | PRN
  Administered 2013-08-02 (×3): 1 mg via INTRAVENOUS

## 2013-08-02 MED ORDER — LIDOCAINE HCL (CARDIAC) 20 MG/ML IV SOLN
INTRAVENOUS | Status: AC
  Filled 2013-08-02: qty 5

## 2013-08-02 MED ORDER — VASOPRESSIN 20 UNIT/ML IJ SOLN
INTRAMUSCULAR | Status: DC | PRN
  Administered 2013-08-02: 10 [IU]

## 2013-08-02 MED ORDER — VASOPRESSIN 20 UNIT/ML IJ SOLN
INTRAMUSCULAR | Status: AC
  Filled 2013-08-02: qty 1

## 2013-08-02 MED ORDER — ONDANSETRON HCL 4 MG/2ML IJ SOLN
INTRAMUSCULAR | Status: DC | PRN
  Administered 2013-08-02 (×2): 2 mg via INTRAVENOUS

## 2013-08-02 MED ORDER — BUPIVACAINE LIPOSOME 1.3 % IJ SUSP
20.0000 mL | Freq: Once | INTRAMUSCULAR | Status: AC
  Administered 2013-08-02: 20 mL
  Filled 2013-08-02: qty 20

## 2013-08-02 MED ORDER — DEXAMETHASONE SODIUM PHOSPHATE 10 MG/ML IJ SOLN
INTRAMUSCULAR | Status: DC | PRN
  Administered 2013-08-02: 5 mg via INTRAVENOUS

## 2013-08-02 MED ORDER — ROCURONIUM BROMIDE 100 MG/10ML IV SOLN
INTRAVENOUS | Status: AC
  Filled 2013-08-02: qty 1

## 2013-08-02 MED ORDER — LACTATED RINGERS IV SOLN
INTRAVENOUS | Status: DC
  Administered 2013-08-02 (×3): via INTRAVENOUS

## 2013-08-02 MED ORDER — PROPOFOL 10 MG/ML IV EMUL
INTRAVENOUS | Status: AC
  Filled 2013-08-02: qty 20

## 2013-08-02 MED ORDER — ROCURONIUM BROMIDE 100 MG/10ML IV SOLN
INTRAVENOUS | Status: DC | PRN
  Administered 2013-08-02: 50 mg via INTRAVENOUS
  Administered 2013-08-02 (×3): 10 mg via INTRAVENOUS
  Administered 2013-08-02: 20 mg via INTRAVENOUS

## 2013-08-02 MED ORDER — PROPOFOL 10 MG/ML IV BOLUS
INTRAVENOUS | Status: DC | PRN
  Administered 2013-08-02: 200 mg via INTRAVENOUS

## 2013-08-02 MED ORDER — MIDAZOLAM HCL 2 MG/2ML IJ SOLN
INTRAMUSCULAR | Status: DC | PRN
  Administered 2013-08-02 (×2): 1 mg via INTRAVENOUS

## 2013-08-02 SURGICAL SUPPLY — 63 items
APPLICATOR COTTON TIP 6IN STRL (MISCELLANEOUS) ×3 IMPLANT
BARRIER ADHS 3X4 INTERCEED (GAUZE/BANDAGES/DRESSINGS) IMPLANT
BENZOIN TINCTURE PRP APPL 2/3 (GAUZE/BANDAGES/DRESSINGS) ×3 IMPLANT
BLADE 15 SAFETY STRL DISP (BLADE) IMPLANT
CANISTER SUCT 3000ML (MISCELLANEOUS) ×3 IMPLANT
CLOTH BEACON ORANGE TIMEOUT ST (SAFETY) ×3 IMPLANT
DECANTER SPIKE VIAL GLASS SM (MISCELLANEOUS) ×6 IMPLANT
DISSECTOR SPONGE CHERRY (GAUZE/BANDAGES/DRESSINGS) IMPLANT
DRAIN PENROSE 1/2X12 (DRAIN) IMPLANT
DRAPE CESAREAN BIRTH W POUCH (DRAPES) IMPLANT
DRAPE PED LAPAROTOMY (DRAPES) ×3 IMPLANT
DRAPE WARM FLUID 44X44 (DRAPE) IMPLANT
DRSG OPSITE POSTOP 4X10 (GAUZE/BANDAGES/DRESSINGS) ×3 IMPLANT
DRSG TEGADERM 1.75X1.75 (GAUZE/BANDAGES/DRESSINGS) IMPLANT
DRSG TEGADERM 2.38X2.75 (GAUZE/BANDAGES/DRESSINGS) IMPLANT
DRSG TELFA 3X8 NADH (GAUZE/BANDAGES/DRESSINGS) ×3 IMPLANT
DRSG XEROFORM 1X8 (GAUZE/BANDAGES/DRESSINGS) ×3 IMPLANT
ELECT REM PT RETURN 9FT ADLT (ELECTROSURGICAL) ×3
ELECTRODE REM PT RTRN 9FT ADLT (ELECTROSURGICAL) ×2 IMPLANT
GLOVE BIOGEL PI IND STRL 8 (GLOVE) ×4 IMPLANT
GLOVE BIOGEL PI INDICATOR 8 (GLOVE) ×2
GLOVE ECLIPSE 7.5 STRL STRAW (GLOVE) ×6 IMPLANT
GLOVE ECLIPSE 8.0 STRL XLNG CF (GLOVE) ×3 IMPLANT
GOWN STRL REUS W/TWL LRG LVL3 (GOWN DISPOSABLE) ×9 IMPLANT
HEMOSTAT SURGICEL 4X8 (HEMOSTASIS) ×3 IMPLANT
MESH HERNIA 6X6 BARD (Mesh General) ×2 IMPLANT
MESH HERNIA BARD 6X6 (Mesh General) ×1 IMPLANT
NEEDLE HYPO 25X1 1.5 SAFETY (NEEDLE) ×3 IMPLANT
NS IRRIG 1000ML POUR BTL (IV SOLUTION) ×6 IMPLANT
PACK ABDOMINAL GYN (CUSTOM PROCEDURE TRAY) ×6 IMPLANT
PAD OB MATERNITY 4.3X12.25 (PERSONAL CARE ITEMS) ×3 IMPLANT
PROTECTOR NERVE ULNAR (MISCELLANEOUS) ×3 IMPLANT
RETRACTOR WND ALEXIS 25 LRG (MISCELLANEOUS) IMPLANT
RTRCTR WOUND ALEXIS 25CM LRG (MISCELLANEOUS)
SPONGE GAUZE 4X4 12PLY STER LF (GAUZE/BANDAGES/DRESSINGS) ×6 IMPLANT
SPONGE LAP 18X18 X RAY DECT (DISPOSABLE) ×6 IMPLANT
STAPLER VISISTAT 35W (STAPLE) ×3 IMPLANT
STRIP CLOSURE SKIN 1/2X4 (GAUZE/BANDAGES/DRESSINGS) ×3 IMPLANT
SUT CHROMIC 3 0 SH 27 (SUTURE) IMPLANT
SUT MNCRL AB 4-0 PS2 18 (SUTURE) IMPLANT
SUT PROLENE 2 0 CT2 30 (SUTURE) IMPLANT
SUT SURG 0 T 19/GS 22 1969 62 (SUTURE) IMPLANT
SUT VIC AB 0 CT1 18XCR BRD8 (SUTURE) ×4 IMPLANT
SUT VIC AB 0 CT1 27 (SUTURE) ×1
SUT VIC AB 0 CT1 27XCR 8 STRN (SUTURE) ×2 IMPLANT
SUT VIC AB 0 CT1 36 (SUTURE) ×6 IMPLANT
SUT VIC AB 0 CT1 8-18 (SUTURE) ×2
SUT VIC AB 1 CT1 18XBRD ANBCTR (SUTURE) IMPLANT
SUT VIC AB 1 CT1 8-18 (SUTURE)
SUT VIC AB 2-0 SH 27 (SUTURE)
SUT VIC AB 2-0 SH 27XBRD (SUTURE) IMPLANT
SUT VIC AB 3-0 CT1 27 (SUTURE) ×2
SUT VIC AB 3-0 CT1 TAPERPNT 27 (SUTURE) ×4 IMPLANT
SUT VIC AB 3-0 SH 27 (SUTURE) ×1
SUT VIC AB 3-0 SH 27X BRD (SUTURE) ×2 IMPLANT
SUT VICRYL 0 TIES 12 18 (SUTURE) ×3 IMPLANT
SUT VICRYL 3 0 BR 18  UND (SUTURE)
SUT VICRYL 3 0 BR 18 UND (SUTURE) IMPLANT
SYR CONTROL 10ML LL (SYRINGE) ×6 IMPLANT
TAPE CLOTH SURG 4X10 WHT LF (GAUZE/BANDAGES/DRESSINGS) ×3 IMPLANT
TOWEL OR 17X24 6PK STRL BLUE (TOWEL DISPOSABLE) ×12 IMPLANT
TRAY FOLEY CATH 14FR (SET/KITS/TRAYS/PACK) ×3 IMPLANT
WATER STERILE IRR 1000ML POUR (IV SOLUTION) ×6 IMPLANT

## 2013-08-02 NOTE — Anesthesia Preprocedure Evaluation (Signed)
Anesthesia Evaluation  Patient identified by MRN, date of birth, ID band Patient awake    Reviewed: Allergy & Precautions, H&P , NPO status , Patient's Chart, lab work & pertinent test results  Airway Mallampati: III TM Distance: >3 FB Neck ROM: Full    Dental no notable dental hx. (+) Teeth Intact   Pulmonary asthma , former smoker,  breath sounds clear to auscultation  Pulmonary exam normal       Cardiovascular hypertension, Pt. on medications + dysrhythmias Rhythm:Regular Rate:Normal     Neuro/Psych negative neurological ROS  negative psych ROS   GI/Hepatic negative GI ROS, Neg liver ROS,   Endo/Other  Morbid obesityHyperlipidemia Thyroid nodule  Renal/GU negative Renal ROS  negative genitourinary   Musculoskeletal   Abdominal (+) + obese,   Peds  Hematology   Anesthesia Other Findings   Reproductive/Obstetrics Post Menopausal bleeding Endometrial hyperplasia Fibroid uterus                           Anesthesia Physical Anesthesia Plan  ASA: III  Anesthesia Plan: General   Post-op Pain Management:    Induction: Intravenous  Airway Management Planned: Oral ETT  Additional Equipment:   Intra-op Plan:   Post-operative Plan: Extubation in OR  Informed Consent: I have reviewed the patients History and Physical, chart, labs and discussed the procedure including the risks, benefits and alternatives for the proposed anesthesia with the patient or authorized representative who has indicated his/her understanding and acceptance.   Dental advisory given  Plan Discussed with: Anesthesiologist, CRNA and Surgeon  Anesthesia Plan Comments:         Anesthesia Quick Evaluation

## 2013-08-02 NOTE — Op Note (Signed)
Operative Note  08/02/2013  3:06 PM  PATIENT:  Amy Stokes  63 y.o. female  PRE-OPERATIVE DIAGNOSIS:  post menopausal bleeding, endometrial hyperplasia, fibroids;CHRONIC INCARCIRATED UMBILCAL HERNIA  POST-OPERATIVE DIAGNOSIS:  post menopausal bleeding, endometrial hyperplasia, fibroids;CHRONIC INCARCIRATED UMBILCAL HERNIA, abdominal pelvic adhesions  PROCEDURE:  Procedure(s): TOTAL ABDOMINAL HYSTERECTOMY WITH BILATERAL SALPINGO-OOPHORECTOMY , abdominal pelvic adhesio lysis   HERNIA REPAIR UMBILICAL ADULT WITH MESH, and Dr. Jackolyn Confer  SURGEON:  Starlyn Skeans. Toney Rakes MD Assist. Donalynn Furlong MD Saunders Glance General Surgeon Hernia Repair  ANESTHESIA:   general  FINDINGS: . A 12-to 14  week size irregular multilobulated uterus with multiple abdominal pelvic adhesions   DESCRIPTION OF OPERATION:The patient was taken to the operating room and was placed in a dorsal supine position. She was then placed under general anesthesia and intubated. A time out was undertaken to correctly identify the patient and procedure to be undertaken. The procedure was begun by performing a Pfannenstiel skin Incision with the first knife. The incision was extended to the level of the fascia using Bovie coagulation. The fascia was scored bilaterally at the midline. The fascial incision was then extended in a curvilinear fashion using the Mayo scissors. The fascia was dissected from the muscular area below by both sharp and blunt dissection. The muscular area was dissected along the midline by both sharp and blunt dissection. The peritoneum was subsequently entered. The incision was then extended in a vertical fashion using Metzenbaum scissors. Immediate notation was made of a large uterine fibroid the uterus was then delivered through the incision. The right And left ovary were within normal limits.extensive abdominal pelvic adhesions were noted which were lysed. A self-retaining O'Connor-O'Sullivan  retractor was placed into the abdomen. Due to the size of the 12-14 week sized fibroid uterus myomectomy was undertaken to decompress the uterus to facilitate the operation. Pitressin 1 unit per 5 cc was infiltrated along the uterine serosa for a total of 30 cc. A vertical incision was made over the fundus of the uterus and multiple fibroids were enucleated. The abdominal contents were packed in the upper abdomen using 2 clean wet laps. Two large Kelly clamps were placed above the cornual portion of the uterus, and the hysterectomy was begun by transecting the round ligaments, which were subsequently suture ligated with 0 Vicryl suture and tag. Uterovesical peritoneum was subsequently incised with the Metzenbaum scissors, and incision was extended to the sidewall on both side allowing dissection to the sidewall and subsequent visualization of the uterus. Once the uterus was felt free, the infundibulopelvic ligaments were then grasped with 2 curved Haney clamps, subsequently cut and the pedicle was initially free tied with 0 Vicryl and subsequently suture ligated with 0 Vicryl suture leaving both tubes and ovaries behind. Attention was then turned to the round ligament, which was also secured in the previous fashion. The uterine vessels were subsequently skeletonized, subsequently grasped with curved Heaney clamps, cut and suture ligated with 0 Vicryl suture. Cardinal ligaments on either side were subsequently grasped, cut and suture ligated with 0 Vicryl suture to the level of the uterosacral ligaments, which were then grasped with curved Haney clamps, cut and suture ligated with 0 Vicryl suture. These sutures were left long and tagged with hemostats. Subsequently, 2 large clamps were placed about the upper vaginal cuff. The specimen was subsequently dissected free with the Jorgenson scissors, and the pedicles were suture ligated with 0 Vicryl suture. The vaginal cuff was also closed with 0 Vicryl suture in an  interrupted fashion. Subsequently, the pelvis was irrigated until clear. Points of bleeding were secured either by Bovie coagulation or by suture ligature with a 0 Vicryl suture. Once hemostasis was achieved, attention was then placed to the left tube and ovary. The left infundibulopelvic ligament close to the ovary was clamped cut and suture ligated with 0  Vicryl suture in a transfixation stitch manner and the left tube and ovary were passed off the operative field. Similar procedure was carried on the contralateral side and right tube and ovary were passed off the operative field. Surgicel was placed at the vaginal cuff for additional hemostasis. The O'Connor-O'Sullivan retractor was subsequently removed. Laps were subsequently removed and the abdominal wall was closed in the following fashion. The fascial layers were approximated with 0 Vicryl with sutures coming from the closing angles interlocking every third towards the midline. The subcutaneous layer was reapproximated with 3-0 Vicryl suture. The skin edges were reapproximated with staples. For postoperative analgesia Exparel 1.3% was infiltrated at the level of the peritoneum, fascia and subcutaneous for a total of 30 cc.The patient tolerated the procedure well. The uterus, cervix, bilateral tubes and ovary were passed off the operative field for pathological evaluation. A pressure dressing was made over the incision. Second portion of the operation will be in a separate dictation whereby Dr. Saunders Glance will be performing her hernia repair with graft placement.   ESTIMATED BLOOD LOSS: 400 cc  Intake/Output Summary (Last 24 hours) at 08/02/13 1506 Last data filed at 08/02/13 1434  Gross per 24 hour  Intake   2000 ml  Output    850 ml  Net   1150 ml     BLOOD ADMINISTERED:none   LOCAL MEDICATIONS USED:  OTHER Exparel 1.3 %  SPECIMEN:  Source of Specimen:  Uterus, cervix, bilateral tubes and ovaries  DISPOSITION OF SPECIMEN:   PATHOLOGY  COUNTS:  YES  PLAN OF CARE: Transfer to [place] PMTD@  Note: This dictation was prepared with  Dragon/digital dictation along withSmart phrase technology. Any transcriptional errors that result from this process are unintentional.

## 2013-08-02 NOTE — Anesthesia Postprocedure Evaluation (Signed)
  Anesthesia Post-op Note  Patient: Amy Stokes  Procedure(s) Performed: Procedure(s) with comments: TOTAL ABDOMINAL HYSTERECTOMY WITH BILATERAL SALPINGO-OOPHORECTOMY  (Bilateral) -   Dr. Phineas Real to assist.  Dr. Zella Richer to follow with an umbilical hernia repair. He will need an additional 1 1/2 hours.  HERNIA REPAIR UMBILICAL ADULT WITH MESH (N/A) Last Vitals:  Filed Vitals:   08/02/13 1730  BP: 150/73  Pulse: 95  Temp:   Resp: 11  Patient is awake and responsive. Pain and nausea are reasonably well controlled. Vital signs are stable and clinically acceptable. Oxygen saturation is clinically acceptable. There are no apparent anesthetic complications at this time. Patient is ready for discharge.

## 2013-08-02 NOTE — Transfer of Care (Signed)
Immediate Anesthesia Transfer of Care Note  Patient: Amy Stokes  Procedure(s) Performed: Procedure(s) with comments: TOTAL ABDOMINAL HYSTERECTOMY WITH BILATERAL SALPINGO-OOPHORECTOMY  (Bilateral) -   Dr. Phineas Real to assist.  Dr. Zella Richer to follow with an umbilical hernia repair. He will need an additional 1 1/2 hours.  HERNIA REPAIR UMBILICAL ADULT WITH MESH (N/A)  Patient Location: PACU  Anesthesia Type:General  Level of Consciousness: awake, alert  and oriented  Airway & Oxygen Therapy: Patient Spontanous Breathing, Patient connected to nasal cannula oxygen and Patient connected to face mask oxygen  Post-op Assessment: Report given to PACU RN, Post -op Vital signs reviewed and stable and Patient moving all extremities X 4  Post vital signs: Reviewed and stable  Complications: No apparent anesthesia complications

## 2013-08-02 NOTE — Interval H&P Note (Signed)
History and Physical Interval Note:  08/02/2013 11:44 AM  Amy Stokes  has presented today for surgery, with the diagnosis of post menopausal bleeding, endometrial hyperplasia, fibroids;CHRONIC INCARCIRATED UMBILCAL HERNIA  The various methods of treatment have been discussed with the patient and family. After consideration of risks, benefits and other options for treatment, the patient has consented to  Procedure(s) with comments: TOTAL ABDOMINAL HYSTERECTOMY WITH BILATERAL SALPINGO-OOPHORECTOMY  (Bilateral) - TAH with Bilateral Salpingo-Oophorectomy.  Dr. Phineas Real to assist.  Dr. Zella Richer to follow with an umbilical hernia repair. He will need an additional 1 1/2 hours.  HERNIA REPAIR UMBILICAL ADULT WITH MESH (N/A) as a surgical intervention .  The patient's history has been reviewed, patient examined, no change in status, stable for surgery.  I have reviewed the patient's chart and labs.  Questions were answered to the patient's satisfaction.     Terrance Mass

## 2013-08-02 NOTE — Op Note (Signed)
Preoperative diagnosis: Large umbilical hernia  Postoperative diagnosis: Same  Procedure: Umbilical hernia repair with mesh.  Surgeon: Jackolyn Confer M.D.  Anesthesia: General plus Marcaine local   Indication:  This is a 63 year old female with an enlarging umbilical hernia. She is undergoing a hysterectomy and she is going to have the hernia repaired simultaneously.  Technique:  Dr. Toney Rakes brought her to the operating room. A general anesthetic was given. The abdominal wall and perineal areas were widely sterilely prepped and draped. A complete hysterectomy was done by Dr. Toney Rakes. Once he was finished I addressed the umbilical hernia. Dr. Toney Rakes had reduced omentum from the hernia during the hysterectomy.  Marcaine solution was infiltrated superficially and deep in the periumbilical area. A subumbilical transverse incision was made through the skin and subcutaneous tissue until the fascia was identified. The subcutaneous tissue was mobilized free from the fascia inferiorly and laterally. The umbilical stalk was mobilized and dissected free from the fascia exposing the underlying 5 cm hernia defect. The large hernia sac and tissue within it were reduced back into the abdominal cavity.  The subcutaneous tissue was freed from the fascia for 3-4 cm around the primary defect. The primary defect was then closed with interrupted 0 Surgilon sutures. The sutures were left long.  A piece of polypropylene mesh was brought into the field. The primary repair sutures were threaded up through the mesh and tied down anchoring the mesh directly over the primary repair. The periphery of the mesh was then anchored to the fascia with a running 0 Prolene suture to allow for 3-4 cm of mesh overlap. The excess mesh was trimmed and removed.  Hemostasis was adequate at this time. The umbilicus was reimplanted onto the mesh and fascia with 3-0 Vicryl suture. The subcutaneous tissue was closed over the mesh with  a running 3-0 Vicryl suture. The skin was closed with a 4-0 Monocryl subcuticular stitch. Steri-Strips and sterile dressings were applied.  She tolerated the procedure well without any apparent complications and was taken to the recovery room in satisfactory condition.

## 2013-08-02 NOTE — H&P (View-Only) (Signed)
Amy Stokes is an 63 y.o. female. Who presented to the office for her preoperative examination. Patient was seen in the office on March 4 for her three-month followup after having been on Provera 10 mg for 10 days of each of the prior 3 months. She had reported that she continued to bleed despite being on Provera. Her endometrial biopsy before starting the Provera demonstrated simple hyperplasia without atypia.  Ultrasound/sono hysterogram today:  Uterus measuring  12.0 x 11 x 8.5 cm with endometrial stripe of 8.7 mm. Fibroid uterus appears enlarged from previous scan 3 months ago. She has one large fibroid in particular that measures 71 x 90 mm which displaces endometrium. Endocervical polyp was noted right ovary with limited images and demonstrated an echo free cyst measuring 22 x 17 mm which was avascular. Left ovary was not seen. The cervix was cleansed with Betadine solution and a sterile catheter was introduced into the uterine cavity and saline was instilled. No intracavitary defect was noted that the endometrium was prominent.    followup endometrial biopsy on 06/01/2013 after 3 months of Provera demonstrated the following: Diagnosis Endometrium, biopsy - PROLIFERATIVE-TYPE ENDOMETRIUM WITH BREAKDOWN. - THERE IS NO EVIDENCE OF HYPERPLASIA OR MALIGNANCY. - SEE COMMENT. Microscopic Comment Sections show fragments of benign proliferative-type endometrium in which there are foci of breakdown with fragmented glands and condensed stroma. The features suggest anovulatory or estrogen withdrawal Bleeding.  Patient has been kept on Megace 40 mg twice a day because she continued to have postmenopausal bleeding despite normal endometrial biopsy. Patient also with chronic enlarging incarcerated umbilical hernia for which she was referred to Dr. Zella Richer who will repair after I performed her total abdominal hysterectomy with bilateral salpingo-oophorectomy. The patient has not seen her PCP and  ovary year. We'll try to get her in to see her before surgery. Patient was normal Pap smear less than one year ago. Patient with no prior history of abnormal Pap smears.   Pertinent Gynecological History: Menses: post-menopausal Bleeding: post menopausal bleeding Contraception: post menopausal status DES exposure: denies Blood transfusions: none Sexually transmitted diseases: HSV Previous GYN Procedures: cryo  Last mammogram: normal Date: 2003 Last pap: normal Date: 2014 OB History: G1, P0A1   Menstrual History: Menarche age: 51  No LMP recorded. Patient is postmenopausal.    Past Medical History  Diagnosis Date  . Hypertension   . Obesity   . Fibroids   . Edema   . Asthma   . Hyperlipidemia   . Thyroid nodule 01/2009    left discovered on chest ct; needle bx "indeterminant" cannon r/o follicular neoplasm     Past Surgical History  Procedure Laterality Date  . Thyroid needle bx    . Mouth surgery    . Tonsillectomy      Family History  Problem Relation Age of Onset  . Arthritis Mother   . Other Mother     neurological disorder  . Pancreatic cancer Mother   . Heart disease Father   . Thyroid nodules Sister   . Heart disease Brother     Social History:  reports that she quit smoking about 42 years ago. She has never used smokeless tobacco. She reports that she drinks alcohol. She reports that she does not use illicit drugs.  Allergies: No Known Allergies   (Not in a hospital admission)  REVIEW OF SYSTEMS: A ROS was performed and pertinent positives and negatives are included in the history.  GENERAL: No fevers or chills. HEENT: No  change in vision, no earache, sore throat or sinus congestion. NECK: No pain or stiffness. CARDIOVASCULAR: No chest pain or pressure. No palpitations. PULMONARY: No shortness of breath, cough or wheeze. GASTROINTESTINAL: umbilical hernia GENITOURINARY: No urinary frequency, urgency, hesitancy or dysuria. MUSCULOSKELETAL: No joint or  muscle pain, no back pain, no recent trauma. DERMATOLOGIC: No rash, no itching, no lesions. ENDOCRINE: No polyuria, polydipsia, no heat or cold intolerance. No recent change in weight. HEMATOLOGICAL: post menopausal bleeding. NEUROLOGIC: No headache, seizures, numbness, tingling or weakness. PSYCHIATRIC: No depression, no loss of interest in normal activity or change in sleep pattern.     Blood pressure 142/78.  Physical Exam:  HEENT:unremarkable Neck:Supple, midline, no thyroid megaly, no carotid bruits Lungs:  Clear to auscultation no rhonchi's or wheezes Heart:Regular rate and rhythm, no murmurs or gallops Breast Exam: both breasts were examined sitting supine position both breasts are symmetrical in appearance no palpable mass or tenderness no supraclavicular axillary lymphadenopathy Abdomen: Pendulous abdomen, large umbilical hernia Pelvic:BUS within normal limits Vagina: Within normal limits slightly atrophic Cervix: No abnormalities Uterus: 10-12 weeks size irregular uterus Adnexa: Difficult to examine due to patient's abdominal girth and size of uterus Extremities: No cords, no edema, signs of vascular insufficiency Rectal: Not done   Assessment/Plan: Patient with persistent postmenopausal bleeding with evidence of endometrial hyperplasia was treated with progestational agent for 3 months. Followup endometrial biopsy no evidence of endometrial hyperplasia the patient continued to bleed. Patient currently on Megace 40 mg twice a day until time of surgery. Patient with enlarging fibroid uterus. Patient also with large umbilical hernia. Patient will be scheduled for total abdominal hysterectomy with bilateral salpingo-oophorectomy and general surgeon will concurrently 2 umbilical hernia repair. We're going to try to get the patient to see her PCP Dr. Regis Bill before her surgery. The following risk factors were discussed:                        Patient was counseled as to the risk of  surgery to include the following:  1. Infection (prohylactic antibiotics will be administered)  2. DVT/Pulmonary Embolism (prophylactic pneumo compression stockings will be used)  3.Trauma to internal organs requiring additional surgical procedure to repair any injury to     Internal organs requiring perhaps additional hospitalization days.  4.Hemmorhage requiring transfusion and blood products which carry risks such as anaphylactic reaction, hepatitis and AIDS  Patient had received literature information on the procedure scheduled and all her questions were answered and fully accepts all risk.   Starlyn Skeans FernandezMD9:55 AMTD@Note : This dictation was prepared with  Dragon/digital dictation along withSmart phrase technology. Any transcriptional errors that result from this process are unintentional.      Terrance Mass 07/26/2013, 9:30 AM  Note: This dictation was prepared with  Dragon/digital dictation along withSmart phrase technology. Any transcriptional errors that result from this process are unintentional.

## 2013-08-03 ENCOUNTER — Encounter (HOSPITAL_COMMUNITY): Payer: Self-pay | Admitting: Gynecology

## 2013-08-03 LAB — CBC
HCT: 36.2 % (ref 36.0–46.0)
Hemoglobin: 11.8 g/dL — ABNORMAL LOW (ref 12.0–15.0)
MCH: 28.3 pg (ref 26.0–34.0)
MCHC: 32.6 g/dL (ref 30.0–36.0)
MCV: 86.8 fL (ref 78.0–100.0)
Platelets: 231 10*3/uL (ref 150–400)
RBC: 4.17 MIL/uL (ref 3.87–5.11)
RDW: 14.6 % (ref 11.5–15.5)
WBC: 10.8 10*3/uL — ABNORMAL HIGH (ref 4.0–10.5)

## 2013-08-03 MED ORDER — HYDROMORPHONE HCL 2 MG PO TABS
2.0000 mg | ORAL_TABLET | ORAL | Status: DC | PRN
  Administered 2013-08-03: 4 mg via ORAL
  Administered 2013-08-04 (×2): 2 mg via ORAL
  Filled 2013-08-03: qty 1
  Filled 2013-08-03: qty 2
  Filled 2013-08-03 (×2): qty 1

## 2013-08-03 MED ORDER — LOSARTAN POTASSIUM 50 MG PO TABS
100.0000 mg | ORAL_TABLET | Freq: Every day | ORAL | Status: DC
  Administered 2013-08-04: 100 mg via ORAL
  Filled 2013-08-03 (×2): qty 2

## 2013-08-03 NOTE — Progress Notes (Signed)
1 Day Post-Op Procedure(s) (LRB): TOTAL ABDOMINAL HYSTERECTOMY WITH BILATERAL SALPINGO-OOPHORECTOMY  (Bilateral) HERNIA REPAIR UMBILICAL ADULT WITH MESH (N/A)  Subjective: Patient reports tolerating PO.    Objective: I have reviewed patient's vital signs, intake and output, medications and labs.  Hgb: 11.8  General: alert and cooperative Resp: clear to auscultation bilaterally Cardio: regular rate and rhythm, S1, S2 normal, no murmur, click, rub or gallop GI: soft, non-tender; bowel sounds normal; no masses,  no organomegaly and mild bowel sounds heard Extremities: extremities normal, atraumatic, no cyanosis or edema Vaginal Bleeding: none  Assessment: s/p Procedure(s) with comments: TOTAL ABDOMINAL HYSTERECTOMY WITH BILATERAL SALPINGO-OOPHORECTOMY  (Bilateral) -   Dr. Phineas Real to assist.  Dr. Zella Richer to follow with an umbilical hernia repair. He will need an additional 1 1/2 hours.  HERNIA REPAIR UMBILICAL ADULT WITH MESH (N/A): stable, progressing well and tolerating diet  Plan: Advance diet Encourage ambulation Advance to PO medication Clear liquids Will hold off antihypertensive agents for now since ashe is normotensive Next Lovenox dose due at 1000 hours today  LOS: 1 day    Amy Stokes 08/03/2013, 7:35 AM

## 2013-08-03 NOTE — Progress Notes (Signed)
Ur chart review completed.  

## 2013-08-03 NOTE — Progress Notes (Signed)
1 Day Post-Op  Subjective: Comfortable.  Taking some po  Objective: Vital signs in last 24 hours: Temp:  [97.4 F (36.3 C)-98.4 F (36.9 C)] 98 F (36.7 C) (05/06 0500) Pulse Rate:  [79-99] 90 (05/06 0730) Resp:  [11-24] 18 (05/06 0827) BP: (123-159)/(60-79) 123/63 mmHg (05/06 0730) SpO2:  [92 %-98 %] 94 % (05/06 0827) Weight:  [300 lb (136.079 kg)] 300 lb (136.079 kg) (05/05 1818) Last BM Date: 08/02/13  Intake/Output from previous day: 05/05 0701 - 05/06 0700 In: 2970 [P.O.:120; I.V.:2850] Out: 1525 [Urine:1100; Blood:425] Intake/Output this shift:    PE: General- In NAD Abdomen-soft, obese, dressings dry  Lab Results:   Recent Labs  08/03/13 0609  WBC 10.8*  HGB 11.8*  HCT 36.2  PLT 231   BMET No results found for this basename: NA, K, CL, CO2, GLUCOSE, BUN, CREATININE, CALCIUM,  in the last 72 hours PT/INR  Recent Labs  08/02/13 1015  LABPROT 12.8  INR 0.98   Comprehensive Metabolic Panel:    Component Value Date/Time   NA 139 07/28/2013 1134   NA 141 07/16/2009 1514   K 4.3 07/28/2013 1134   K 3.9 07/16/2009 1514   CL 102 07/28/2013 1134   CL 101 07/16/2009 1514   CO2 30 07/28/2013 1134   CO2 34* 07/16/2009 1514   BUN 12 07/28/2013 1134   BUN 10 07/16/2009 1514   CREATININE 0.6 07/28/2013 1134   CREATININE 0.7 07/16/2009 1514   GLUCOSE 89 07/28/2013 1134   GLUCOSE 112* 07/16/2009 1514   CALCIUM 9.0 07/28/2013 1134   CALCIUM 8.8 07/16/2009 1514   AST 18 07/28/2013 1134   AST 18 04/03/2011 1043   ALT 25 07/28/2013 1134   ALT 19 04/03/2011 1043   ALKPHOS 66 07/28/2013 1134   ALKPHOS 92 04/03/2011 1043   BILITOT 1.0 07/28/2013 1134   BILITOT 0.5 04/03/2011 1043   PROT 6.9 07/28/2013 1134   PROT 6.7 04/03/2011 1043   ALBUMIN 3.5 07/28/2013 1134   ALBUMIN 3.5 04/03/2011 1043     Studies/Results: No results found.  Anti-infectives: Anti-infectives   Start     Dose/Rate Route Frequency Ordered Stop   08/02/13 2000  ceFAZolin (ANCEF) injection 1 g  Status:   Discontinued     1 g Intramuscular  Once 08/02/13 1825 08/02/13 1921   08/02/13 2000  ceFAZolin (ANCEF) IVPB 1 g/50 mL premix     1 g 100 mL/hr over 30 Minutes Intravenous  Once 08/02/13 1921 08/02/13 2053   08/02/13 1003  cefoTEtan (CEFOTAN) 2 g in dextrose 5 % 50 mL IVPB  Status:  Discontinued     2 g 100 mL/hr over 30 Minutes Intravenous On call to O.R. 08/02/13 1003 08/02/13 1008   08/02/13 1003  ceFAZolin (ANCEF) 3 g in dextrose 5 % 50 mL IVPB     3 g 160 mL/hr over 30 Minutes Intravenous On call to O.R. 08/02/13 1003 08/02/13 1215      Assessment Active Problems: Large umbilical hernia repair with mesh 08/02/13-doing well    LOS: 1 day   Plan: Advance activities and diet as tolerated.  No lifting, pushing, or pulling over 10 pounds for 6 weeks.   Follow-up appointment with me in 2-3 weeks.   Rhunette Croft Rosenbower 08/03/2013

## 2013-08-03 NOTE — Discharge Instructions (Signed)
Minimize bending.  No lifting, pushing, or pulling over 10 pounds for 6 weeks.  Appointment with Dr. Zella Richer in 2-3 weeks.  Please call [phone] to make this appointment if you do not all ready have one.

## 2013-08-04 MED ORDER — RANITIDINE HCL 150 MG/10ML PO SYRP: 150.0000 mg | ORAL_SOLUTION | Freq: Two times a day (BID) | ORAL | Status: DC

## 2013-08-04 MED ORDER — HYDROMORPHONE HCL 2 MG PO TABS: 2.0000 mg | ORAL_TABLET | ORAL | Status: DC | PRN

## 2013-08-04 MED ORDER — ENOXAPARIN (LOVENOX) PATIENT EDUCATION KIT
PACK | Freq: Once | Status: AC
  Administered 2013-08-04: 16:00:00
  Filled 2013-08-04: qty 1

## 2013-08-04 MED ORDER — SENNA-DOCUSATE SODIUM 8.6-50 MG PO TABS: 1.0000 | ORAL_TABLET | Freq: Every day | ORAL | Status: DC

## 2013-08-04 MED ORDER — FAMOTIDINE 20 MG PO TABS
20.0000 mg | ORAL_TABLET | Freq: Two times a day (BID) | ORAL | Status: DC
  Administered 2013-08-04: 20 mg via ORAL
  Filled 2013-08-04: qty 1

## 2013-08-04 MED ORDER — BISACODYL 10 MG RE SUPP
10.0000 mg | Freq: Once | RECTAL | Status: AC
  Administered 2013-08-04: 10 mg via RECTAL
  Filled 2013-08-04: qty 1

## 2013-08-04 MED ORDER — ENOXAPARIN SODIUM 40 MG/0.4ML ~~LOC~~ SOLN: SUBCUTANEOUS | Status: DC

## 2013-08-04 NOTE — Discharge Summary (Signed)
Physician Discharge Summary  Patient ID: LAROSA RHINES MRN: [id number] DOB/AGE: 03-Jan-1951 63 y.o.  Admit date: 08/02/2013 Discharge date: 08/04/2013  Admission Diagnoses: Postmenopausal bleeding, endometrial hyperplasia, fibroids, chronic incarcerated umbilical hernia  Discharge Diagnoses: Postmenopausal bleeding, endometrial hyperplasia, fibroids, chronic incarcerated umbilical hernia, extensive abdominal pelvic adhesions Active Problems:   Post-operative state   Discharged Condition: good  Hospital Course: Patient was admitted to the hospital May 5 whereby she underwent a total abdominal hysterectomy with bilateral salpingo-oophorectomy and abdominal pelvic adhesiolysis and repair of incarcerated umbilical hernia with graft. The general surgeon Dr. Saunders Glance repair her hernia.  Patient received Lovenox 40 mg subcutaneous for DVT prophylaxis   which was administered 2 hours before surgery and continue to receive every 24 hours while in the hospital and will continue to do so at home for 2 weeks. She was kept on the PCA pump with Dilaudid  And it was discontinued after 24 hours and started  on by mouth diet lochia. Her urine output was adequate and her Foley catheter were discontinued after 24 hours. We advanced her diet slowly from clear liquids to a regular diet. This morning she was given a Dulcolax suppository. She was up and ambulating and had a bowel movement and pass gas and was made to be discharged home.    Consults: General surgeon Dr. Juline Patch repaired umbilical hernia that was incarcerated  Significant Diagnostic Studies: labs: Postoperative hemoglobin 11.8. Pathology report:  Diagnosis Uterus, ovaries and fallopian tubes, with cervix - LEIOMYOMATA. - ENDOMETRIUM: BENIGN ENDOMETRIAL POLYP AND ADJACENT BENIGN INACTIVE ENDOMETRIUM WITH PSEUDODECIDUALIZED STROMA, CONSISTENT WITH EXOGENOUS HORMONE EFFECT, NO EVIDENCE OF ATYPIA, HYPERPLASIA OR MALIGNANCY. - CERVIX:  BENIGN SQUAMOUS MUCOSA AND ENDOCERVICAL MUCOSA, NO DYSPLASIA OR MALIGNANCY. - BILATERAL OVARIES: BENIGN OVARIAN TISSUE, NO EVIDENCE OF ATYPIA OR MALIGNANCY. - BILATERAL FALLOPIAN TUBES: BILATERAL FALLOPIAN TUBAL TISSUE WITH PARATUBAL CYSTS, NO ATYPIA OR MALIGNANCY. H. CATHE  Treatments: surgery: Total abdominal hysterectomy with bilateral salpingo-oophorectomy lysis of abdominopelvic adhesions. General surgeon completed the repair of incarcerated umbilical hernia.  Discharge Exam: Blood pressure 148/61, pulse 101, temperature 98.3 F (36.8 C), temperature source Oral, resp. rate 18, height 5\' 8"  (1.727 m), weight 300 lb (136.079 kg), SpO2 96.00%. General appearance: alert and cooperative Resp: clear to auscultation bilaterally Cardio: regular rate and rhythm, S1, S2 normal, no murmur, click, rub or gallop GI: soft, non-tender; bowel sounds normal; no masses,  no organomegaly Extremities: extremities normal, atraumatic, no cyanosis or edema  Disposition: 01-Home or Self Care  Discharge Orders   Future Appointments Provider Department Dept Phone   08/08/2013 10:30 AM Terrance Mass, MD [facility] [phone]   08/17/2013 11:30 AM Terrance Mass, MD [facility] [phone]   Future Orders Complete By Expires   Call MD for:  redness, tenderness, or signs of infection (pain, swelling, bleeding, redness, odor or green/yellow discharge around incision site)  As directed    Call MD for:  severe or increased pain, loss or decreased feeling  in affected limb(s)  As directed    Call MD for:  temperature >100.5  As directed    Discharge instructions  As directed    Driving Restrictions  As directed    Lifting restrictions  As directed    Resume previous diet  As directed        Medication List    STOP taking these medications       HYDROcodone-acetaminophen 5-325 MG per tablet  Commonly known as:  NORCO/VICODIN  megestrol 40 MG tablet   Commonly known as:  MEGACE      TAKE these medications       albuterol 108 (90 BASE) MCG/ACT inhaler  Commonly known as:  PROAIR HFA  Inhale 2 puffs into the lungs every 6 (six) hours as needed for wheezing.     enoxaparin 40 MG/0.4ML injection  Commonly known as:  LOVENOX  Administer SQ Q daily for two weeks post op     Fluticasone-Salmeterol 100-50 MCG/DOSE Aepb  Commonly known as:  ADVAIR DISKUS  Inhale 1 puff into the lungs 2 (two) times daily. Or as directed     HYDROmorphone 2 MG tablet  Commonly known as:  DILAUDID  Take 1-2 tablets (2-4 mg total) by mouth every 4 (four) hours as needed for severe pain.     losartan 100 MG tablet  Commonly known as:  COZAAR  Take 0.5-1 tablets (50-100 mg total) by mouth daily. For hypertension     metoCLOPramide 10 MG tablet  Commonly known as:  REGLAN  Take 1 tablet (10 mg total) by mouth 3 (three) times daily with meals.     MULTIPLE VITAMIN PO  Take 1 tablet by mouth 2 (two) times daily. Vitafusion.     PROBIOTIC DAILY PO  Take 1 tablet by mouth daily. Insync     sennosides-docusate sodium 8.6-50 MG tablet  Commonly known as:  SENOKOT-S  Take 1 tablet by mouth daily.       Patient to return to the office May 11 have her staples removed  Signed: Terrance Mass 08/04/2013, 2:27 PM

## 2013-08-04 NOTE — Progress Notes (Signed)
Pt. Is discharged  In the care of husband. Downstairs  Per wheelchair with R.N. Judd Lien. No equipment needed for home use. Stable. Discharge instructions were given to pt. Questions were asked and answered. Denies any pain or ddiscomfort. Pt was also given instructions and instructional kit on giving Lovenox. Pt states she understands .Abdominal dressing clean and intact.honeycomb }

## 2013-08-08 ENCOUNTER — Ambulatory Visit (INDEPENDENT_AMBULATORY_CARE_PROVIDER_SITE_OTHER): Payer: BC Managed Care – PPO | Admitting: Gynecology

## 2013-08-08 ENCOUNTER — Encounter: Payer: Self-pay | Admitting: Gynecology

## 2013-08-08 VITALS — BP 144/86

## 2013-08-08 DIAGNOSIS — Z4802 Encounter for removal of sutures: Secondary | ICD-10-CM

## 2013-08-08 NOTE — Progress Notes (Signed)
   Patient presented to the office today to have her staples removed from her surgery. On 08/02/2013 patient underwent a total of a live hysterectomy with bilateral salpingo-oophorectomy along with abdominal and pelvic adhesio lysis. Concurrently general surgeon Dr. Jackolyn Confer had performed an umbilical hernia repair with mesh. Because patient is morbidly obese with history of hypertension she received Lovenox 40 mg subcutaneous preop as well as postop and currently taking a daily as prescribed for 2 weeks from the time of her surgery. She had some light vaginal spotting is to be expected. She is having normal bowel movements and urinating well.  Exam: Slight ecchymosis was noted that the Pfannenstiel incision site otherwise intact incision. Staples were removed and Steri-Strips applied. The umbilical hernia repair site appears intact Steri-Strips in place.  The patient scheduled to see a general surgeon at the end of the week. She will see me in 2 weeks for a postop visit. She will continue her Lovenox to complete the 2 weeks for DVT prophylaxis.

## 2013-08-09 ENCOUNTER — Ambulatory Visit (INDEPENDENT_AMBULATORY_CARE_PROVIDER_SITE_OTHER): Payer: BC Managed Care – PPO

## 2013-08-09 ENCOUNTER — Other Ambulatory Visit: Payer: Self-pay | Admitting: Gynecology

## 2013-08-09 ENCOUNTER — Ambulatory Visit (INDEPENDENT_AMBULATORY_CARE_PROVIDER_SITE_OTHER): Payer: BC Managed Care – PPO | Admitting: Gynecology

## 2013-08-09 ENCOUNTER — Telehealth: Payer: Self-pay | Admitting: Obstetrics & Gynecology

## 2013-08-09 ENCOUNTER — Telehealth: Payer: Self-pay | Admitting: *Deleted

## 2013-08-09 DIAGNOSIS — R109 Unspecified abdominal pain: Secondary | ICD-10-CM

## 2013-08-09 DIAGNOSIS — IMO0002 Reserved for concepts with insufficient information to code with codable children: Secondary | ICD-10-CM

## 2013-08-09 DIAGNOSIS — R188 Other ascites: Secondary | ICD-10-CM

## 2013-08-09 LAB — CBC WITH DIFFERENTIAL/PLATELET
Basophils Absolute: 0 10*3/uL (ref 0.0–0.1)
Basophils Relative: 0 % (ref 0–1)
Eosinophils Absolute: 0.2 10*3/uL (ref 0.0–0.7)
Eosinophils Relative: 2 % (ref 0–5)
HCT: 39.5 % (ref 36.0–46.0)
Hemoglobin: 13.2 g/dL (ref 12.0–15.0)
Lymphocytes Relative: 14 % (ref 12–46)
Lymphs Abs: 1.5 10*3/uL (ref 0.7–4.0)
MCH: 28.4 pg (ref 26.0–34.0)
MCHC: 33.4 g/dL (ref 30.0–36.0)
MCV: 85.1 fL (ref 78.0–100.0)
Monocytes Absolute: 0.8 10*3/uL (ref 0.1–1.0)
Monocytes Relative: 7 % (ref 3–12)
Neutro Abs: 8.5 10*3/uL — ABNORMAL HIGH (ref 1.7–7.7)
Neutrophils Relative %: 77 % (ref 43–77)
Platelets: 372 10*3/uL (ref 150–400)
RBC: 4.64 MIL/uL (ref 3.87–5.11)
RDW: 14.6 % (ref 11.5–15.5)
WBC: 11 10*3/uL — ABNORMAL HIGH (ref 4.0–10.5)

## 2013-08-09 NOTE — Telephone Encounter (Signed)
Patient calls with complaint of emesis x 1 this evening.  Patient ate, then felt tired and laid down to rest.  She then felt like she had a lot of acid in her stomach and vomited x 1.  She states the emesis wasn't a large amount but looked like bile.  Denies nausea, she just felt the acid and then vomited.  Has been able to drink fluid since and is having no nausea.  Now just feels like acid is present.  She reports having a BM this morning and feels like she has been passing flatus.  Denies temperature.  She took temp before calling and it is 99.4.  Reports abdomen is soft.  She is not taking anything for pain medication.  Reviewed EMR.  Pt see today in office.  Has a fluid collection around hernia repair ~9cm that having fluid in RLQ as well.  In addition, ~8cm probable cuff hematoma present.  WBC ct today 11.0.  Pt aware of all of these findings.  She does not feel she needs or wants to go to the ER.  Wants to know what she can take for the acid sensation. Pepto bismol, tums, Mylanta suggested.  Advised pt to monitor temp closely due to the fluid/blood collections present around surgical sites.  Advised pt to try to stay hydrated but if she has any further emesis, she needs to be seen in the ER.  Also, if has temp >100.5, needs to be seen.  Pt knows she can page me back directly with any additional concerns.  Advised I will send message to Dr. Toney Rakes.

## 2013-08-09 NOTE — Telephone Encounter (Signed)
Pt informed

## 2013-08-09 NOTE — Telephone Encounter (Signed)
Pt was had staples removed yesterday at OV  post TAH, states increased bleeding last night changing pads about every 4 hours. C/o of stomach discomfort not pain,mainly not able to sleep due to the discomfort. No fever, taking Dilaudid for pain. Pt would like recommendations?

## 2013-08-09 NOTE — Telephone Encounter (Signed)
Sounds to me like she needs an office visit 

## 2013-08-09 NOTE — Progress Notes (Signed)
   Patient presented to the office this morning stating she did notice some vaginal bleeding and was instructed to come to the office for further evaluation. Patient was seen on May 11 whereby her Pfannenstiel incision site Staples have been removed.  On 08/02/2013 patient underwent a total of a live hysterectomy with bilateral salpingo-oophorectomy along with abdominal and pelvic adhesio lysis. Concurrently general surgeon Dr. Jackolyn Confer had performed an umbilical hernia repair with mesh. Because patient is morbidly obese with history of hypertension she received Lovenox 40 mg subcutaneous preop as well as postop and currently taking a daily as prescribed for 2 weeks from the time of her surgery.   Exam: Orthostatic blood pressure and pulse as follows: Pulse 92 blood pressure 140/80 laying  Down Past 88 blood pressure 142/70 sating down  Cardiac exam: Heart regular rate and rhythm no murmurs or gallops Abdomen: Ecchymosis was noted in the peri-umbilicus hernia repair site as well as in the Pfannenstiel incision site. Abdomen was soft nontender no rebound no guarding positive bowel sounds Pelvic exam: Bartholin urethra Skene was within normal limits Vaginal exam: Dark brown blood was noted speculum exam demonstrated an intact vaginal cuff no active bleeding noted Silver nitrate was applied to a little area of the cuff. Bimanual exam although limited because of patient's abdominal girth cannot elicit any pain to the patient was instructed to have an ultrasound here in the office today and the following was noted:  Umbilicus cystic solid cervical area measuring 67 x 46 x 8 mm, right lower quadrant linear fluid-filled space measuring 79 x 14 x 69 mm left lower quadrant with negative some of this fluid was seen in the cul-de-sac with a measurement. (Hematoma)  CBC: 08/03/2013 hemoglobin 11.8 platelet count 251,000 08/09/2013 hemoglobin 13.2 platelet count 372,000  I contacted Dr. Jackolyn Confer and discussed the above findings with him. He has seen this before and patient had similar procedure with placement on the local graft after hernia repair who had been on blood thinners. He has recommended that we discontinue the Lovenox. I will prescribe TED hose for the patient to apply and to rest for the next 2 days and he is going to see her as well on Friday. He may decide to repeat her CBC then we'll we can repeat the following week. All the above were discussed with the patient and all questions were answered and we'll follow accordingly. Of note patient has not felt lightheaded or any weakness and has had normal bowel movements and urination.

## 2013-08-12 ENCOUNTER — Encounter (INDEPENDENT_AMBULATORY_CARE_PROVIDER_SITE_OTHER): Payer: Self-pay | Admitting: General Surgery

## 2013-08-12 ENCOUNTER — Ambulatory Visit (INDEPENDENT_AMBULATORY_CARE_PROVIDER_SITE_OTHER): Payer: BC Managed Care – PPO | Admitting: General Surgery

## 2013-08-12 VITALS — BP 140/75 | HR 91 | Temp 98.1°F | Resp 16 | Ht 68.25 in | Wt 290.8 lb

## 2013-08-12 DIAGNOSIS — Z4889 Encounter for other specified surgical aftercare: Secondary | ICD-10-CM

## 2013-08-12 NOTE — Patient Instructions (Signed)
6 weeks after surgery, may resume normal activities as tolerated as discussed as long as it is okay with Dr. Toney Rakes

## 2013-08-12 NOTE — Progress Notes (Signed)
Procedure:  Umbilical hernia repair with mesh  Date:  08/02/13  Pathology:  na  History:  She is here for her first postoperative visit. She was on Lovenox postoperatively and had some significant bruising. She currently is off that. She is slowly getting better every day.  Exam: General- Is in NAD. Abdomen soft, some infraumbilical ecchymosis, umbilical incision is clean and intact.  Assessment:  Progressing well postoperatively. No evidence of infection or bleeding from the umbilical wound.  Plan:  Light activities until 6 weeks after surgery and may resume normal activities as tolerated from my standpoint. Return visit prn.

## 2013-08-17 ENCOUNTER — Encounter: Payer: Self-pay | Admitting: Gynecology

## 2013-08-17 ENCOUNTER — Encounter (INDEPENDENT_AMBULATORY_CARE_PROVIDER_SITE_OTHER): Payer: Self-pay | Admitting: General Surgery

## 2013-08-17 ENCOUNTER — Ambulatory Visit (INDEPENDENT_AMBULATORY_CARE_PROVIDER_SITE_OTHER): Payer: BC Managed Care – PPO | Admitting: Gynecology

## 2013-08-17 VITALS — BP 134/86

## 2013-08-17 DIAGNOSIS — Z09 Encounter for follow-up examination after completed treatment for conditions other than malignant neoplasm: Secondary | ICD-10-CM

## 2013-08-17 NOTE — Progress Notes (Signed)
   Patient presented to the office today for her two-week postoperative visit. On 08/02/2013 patient underwent a total of a live hysterectomy with bilateral salpingo-oophorectomy along with abdominal and pelvic adhesio lysis. Concurrently general surgeon Dr. Jackolyn Confer had performed an umbilical hernia repair with mesh. Because patient is morbidly obese with history of hypertension she received Lovenox 40 mg subcutaneous preop as well as posto which was discontinued when she presented to the office one week after her surgery complaining of some vaginal bleeding and ecchymosis around her umbilicus and Pfannenstiel incision area. Please see the note made for for additional detail.  The patient is doing well today asymptomatic no vaginal bleeding reported. She is up and ambulating doing well. She had seen Dr.Rosenbower last week and he released her to resume normal activity in another 6 weeks.  Pathology report from surgery as follows: Diagnosis Uterus, ovaries and fallopian tubes, with cervix - LEIOMYOMATA. - ENDOMETRIUM: BENIGN ENDOMETRIAL POLYP AND ADJACENT BENIGN INACTIVE ENDOMETRIUM WITH PSEUDODECIDUALIZED STROMA, CONSISTENT WITH EXOGENOUS HORMONE EFFECT, NO EVIDENCE OF ATYPIA, HYPERPLASIA OR MALIGNANCY. - CERVIX: BENIGN SQUAMOUS MUCOSA AND ENDOCERVICAL MUCOSA, NO DYSPLASIA OR MALIGNANCY. - BILATERAL OVARIES: BENIGN OVARIAN TISSUE, NO EVIDENCE OF ATYPIA OR MALIGNANCY. - BILATERAL FALLOPIAN TUBES: BILATERAL FALLOPIAN TUBAL TISSUE WITH PARATUBAL CYSTS, NO ATYPIA OR MALIGNANCY.  Exam: Abdomen: Soft nontender no rebound or guarding ecchymosis from the periumbilical and Pfannenstiel areas completely healed. Pelvic exam: Bartholin urethra Skene was within normal limits Vagina: Vaginal cuff intact Bimanual exam: No palpable mass or tenderness Rectal exam not done  Assessment/plan: Patient 2 weeks status post total abdominal hysterectomy with bilateral salpingo-oophorectomy along with  concurrent umbilical hernia repair with graft by general surgeon doing well. Patient has stopped her Lovenox and is doing well. She will be prescribed MetroGel to apply intravaginally each bedtime for 5-7 days. Ultrasound done at last office visit on May 12 that demonstrated the following:  Umbilicus cystic solid cervical area measuring 67 x 46 x 8 mm, right lower quadrant linear fluid-filled space measuring 79 x 14 x 69 mm left lower quadrant with negative some of this fluid was seen in the cul-de-sac with a measurement. (Hematoma)  CBC:  08/03/2013 hemoglobin 11.8 platelet count 251,000  08/09/2013 hemoglobin 13.2 platelet count 372,000  Since patient is asymptomatic will monitor clinically as per the recommendation of the general surgeon as well. Patient will return back to see me in 4 weeks for final postop visit.

## 2013-08-18 ENCOUNTER — Ambulatory Visit (INDEPENDENT_AMBULATORY_CARE_PROVIDER_SITE_OTHER): Payer: BC Managed Care – PPO | Admitting: General Surgery

## 2013-08-18 VITALS — BP 164/90 | HR 100 | Temp 98.1°F | Resp 20 | Wt 286.0 lb

## 2013-08-18 DIAGNOSIS — IMO0002 Reserved for concepts with insufficient information to code with codable children: Secondary | ICD-10-CM

## 2013-08-18 NOTE — Progress Notes (Signed)
Subjective:     Patient ID: Amy Stokes, female   DOB: 04-30-1950, 64 y.o.   MRN: [id number]  HPI  Patient umbilical hernia repair by Dr. Zella Richer on 5/5. She presents to the urgent office today after developing swelling at hernia site. This came on yesterday. She has had no pain associated with the. Review of Systems     Objective:   Physical Exam Soft and nontender, there is swelling of her umbilicus which reduces but seems to only be fluid. I do not feel a defect in her fascia. There is no pain. No signs of infection. No redness.    Assessment:     Status post umbilical hernia repair with seroma    Plan:     Warm compresses, followup with Dr. Zella Richer in a week or so.

## 2013-08-23 ENCOUNTER — Other Ambulatory Visit: Payer: Self-pay

## 2013-08-23 ENCOUNTER — Other Ambulatory Visit: Payer: Self-pay | Admitting: Internal Medicine

## 2013-08-23 DIAGNOSIS — R928 Other abnormal and inconclusive findings on diagnostic imaging of breast: Secondary | ICD-10-CM

## 2013-08-25 ENCOUNTER — Ambulatory Visit (INDEPENDENT_AMBULATORY_CARE_PROVIDER_SITE_OTHER): Payer: BC Managed Care – PPO | Admitting: General Surgery

## 2013-08-25 ENCOUNTER — Encounter (INDEPENDENT_AMBULATORY_CARE_PROVIDER_SITE_OTHER): Payer: Self-pay | Admitting: General Surgery

## 2013-08-25 VITALS — BP 118/64 | HR 90 | Temp 98.6°F | Resp 18 | Ht 68.5 in | Wt 288.8 lb

## 2013-08-25 DIAGNOSIS — Z4889 Encounter for other specified surgical aftercare: Secondary | ICD-10-CM

## 2013-08-25 NOTE — Patient Instructions (Signed)
Light activities for 6-8 weeks after surgery.

## 2013-08-25 NOTE — Progress Notes (Signed)
Procedure:  Umbilical hernia repair with mesh  Date:  08/02/13  Pathology:   History:  She is here for another postoperative visit. She saw Dr. Grandville Silos last week because of new onset swelling of her umbilicus. She states the swelling is better. Exam: General- Is in NAD. Abdomen soft, umbilical skin is protruding but hernia repair is solid, incision is clean and intact  Assessment: Umbilical skin suture affixing it to the mesh popped loose leading to more of a skin protrusion. Hernia repair remains intact.  Plan:  Recommended light activities for up to 8 weeks from surgery. Return visit in 2 months. If she feels she is doing well at that time, I told her she could cancel the appointment.

## 2013-08-29 ENCOUNTER — Ambulatory Visit
Admission: RE | Admit: 2013-08-29 | Discharge: 2013-08-29 | Disposition: A | Discharge status: Home / Self Care | Payer: BC Managed Care – PPO | Source: Ambulatory Visit | Attending: Internal Medicine | Admitting: Internal Medicine

## 2013-08-29 DIAGNOSIS — R928 Other abnormal and inconclusive findings on diagnostic imaging of breast: Secondary | ICD-10-CM

## 2013-09-16 ENCOUNTER — Ambulatory Visit (INDEPENDENT_AMBULATORY_CARE_PROVIDER_SITE_OTHER): Payer: BC Managed Care – PPO | Admitting: Gynecology

## 2013-09-16 ENCOUNTER — Encounter: Payer: Self-pay | Admitting: Gynecology

## 2013-09-16 VITALS — BP 138/82

## 2013-09-16 DIAGNOSIS — Z09 Encounter for follow-up examination after completed treatment for conditions other than malignant neoplasm: Secondary | ICD-10-CM

## 2013-09-16 NOTE — Progress Notes (Signed)
   The patient presented to the office for her final postop visit. On 08/02/2013 patient underwent a total abdominal hysterectomy with bilateral salpingo-oophorectomy along with abdominal and pelvic adhesiolysis. Concurrently general surgeon Dr. Jackolyn Confer had performed an umbilical hernia repair with mesh. Because patient is morbidly obese with history of hypertension she received Lovenox 40 mg subcutaneous preop as well as postop. Patient recently saw Dr. Zella Richer for postop visit as well. She has been doing well. Pathology report from her surgery is as follows:  Diagnosis  Uterus, ovaries and fallopian tubes, with cervix  - LEIOMYOMATA.  - ENDOMETRIUM: BENIGN ENDOMETRIAL POLYP AND ADJACENT BENIGN INACTIVE  ENDOMETRIUM WITH PSEUDODECIDUALIZED STROMA, CONSISTENT WITH EXOGENOUS  HORMONE EFFECT, NO EVIDENCE OF ATYPIA, HYPERPLASIA OR MALIGNANCY.  - CERVIX: BENIGN SQUAMOUS MUCOSA AND ENDOCERVICAL MUCOSA, NO DYSPLASIA OR  MALIGNANCY.  - BILATERAL OVARIES: BENIGN OVARIAN TISSUE, NO EVIDENCE OF ATYPIA OR MALIGNANCY.  - BILATERAL FALLOPIAN TUBES: BILATERAL FALLOPIAN TUBAL TISSUE WITH PARATUBAL  CYSTS, NO ATYPIA OR MALIGNANCY.  Exam: Abdomen soft nontender no rebound or guarding ecchymosis near the periumbilical region not seeing or in the Pfannenstiel incision site. Pelvic exam: Bartholin urethra Skene was within normal limits Vagina: Vaginal cuff intact Bimanual exam: No palpable masses or tenderness Rectal exam not done  Assessment/plan: Patient 7 weeksstatus post total abdominal hysterectomy with bilateral salpingo-oophorectomy along with concurrent umbilical hernia repair with graft by general surgeon doing well. Patient has stopped her Lovenox and is doing well. Patient will resume her normal activity and return to the office in 1 year or when necessary.

## 2013-10-19 ENCOUNTER — Ambulatory Visit (INDEPENDENT_AMBULATORY_CARE_PROVIDER_SITE_OTHER): Payer: BC Managed Care – PPO | Admitting: General Surgery

## 2013-12-01 ENCOUNTER — Encounter: Payer: Self-pay | Admitting: Internal Medicine

## 2013-12-01 ENCOUNTER — Ambulatory Visit (INDEPENDENT_AMBULATORY_CARE_PROVIDER_SITE_OTHER): Payer: BC Managed Care – PPO | Admitting: Internal Medicine

## 2013-12-01 VITALS — BP 140/70 | Temp 98.8°F | Ht 68.5 in | Wt 297.4 lb

## 2013-12-01 DIAGNOSIS — G478 Other sleep disorders: Secondary | ICD-10-CM

## 2013-12-01 DIAGNOSIS — I10 Essential (primary) hypertension: Secondary | ICD-10-CM

## 2013-12-01 DIAGNOSIS — G4730 Sleep apnea, unspecified: Secondary | ICD-10-CM

## 2013-12-01 DIAGNOSIS — G4733 Obstructive sleep apnea (adult) (pediatric): Secondary | ICD-10-CM | POA: Insufficient documentation

## 2013-12-01 DIAGNOSIS — E041 Nontoxic single thyroid nodule: Secondary | ICD-10-CM

## 2013-12-01 NOTE — Patient Instructions (Addendum)
I will do referral to sleep  Specialist    For the reasons  Discussed . Continue lifestyle intervention healthy eating and exercise . For blood prsesure control.

## 2013-12-01 NOTE — Progress Notes (Signed)
Pre visit review using our clinic review tool, if applicable. No additional management support is needed unless otherwise documented below in the visit note.  Chief Complaint  Patient presents with  . Snoring    Would like a sleep study.  [name] Restless sleep    HPI: .Amy Stokes comes in today  For poss sleep evaluation.   Stopped breathing twice in recovering room.following abd surgery and advised to seek evaluation   Alsousing fit bit and noted to have restless sleep .Husband ( who is on cpap) says when rolls  on back  And notices he may have to  Nudge her  to start breathing . Again ? Positional .    Snoring .long term    fam hx snoring parents   But no sp  Dx [facility]sa.   Father died  heart 86 . Premature heart disease.    BP  Ok when eating correctly.   Now has insurance ; no welleness in a while   Will need thyroid fu  No sx per pat  Hx of large nodule  bx non diagnostic .  ROS: See pertinent positives and negatives per HPI.no fever excess drowsiness working on weight loss    Past Medical History  Diagnosis Date  . Hypertension   . Obesity   . Fibroids   . Edema   . Asthma   . Hyperlipidemia   . Thyroid nodule 01/2009    left discovered on chest ct; needle bx "indeterminant" cannon r/o follicular neoplasm     Family History  Problem Relation Age of Onset  . Arthritis Mother   . Other Mother     neurological disorder  . Pancreatic cancer Mother   . Heart disease Father   . Thyroid nodules Sister   . Heart disease Brother     History   Social History  . Marital Status: Married    Spouse Name: N/A    Number of Children: N/A  . Years of Education: N/A   Social History Main Topics  . Smoking status: Former Smoker    Quit date: 04/29/1971  . Smokeless tobacco: Never Used  . Alcohol Use: Yes     Comment: occ  . Drug Use: No  . Sexual Activity: No   Other Topics Concern  . None   Social History Narrative   Married   Occupation: Chiropractor  currently unemployed   Married   Regular exercise-no   [place] of 2   Pet cat          Outpatient Encounter Prescriptions as of 12/01/2013  Medication Sig  . albuterol (PROAIR HFA) 108 (90 BASE) MCG/ACT inhaler Inhale 2 puffs into the lungs every 6 (six) hours as needed for wheezing.  [name] aspirin 81 MG tablet Take 81 mg by mouth daily.  . Fluticasone-Salmeterol (ADVAIR DISKUS) 100-50 MCG/DOSE AEPB Inhale 1 puff into the lungs 2 (two) times daily. Or as directed  . losartan (COZAAR) 100 MG tablet Take 0.5-1 tablets (50-100 mg total) by mouth daily. For hypertension  . MULTIPLE VITAMIN PO Take 1 tablet by mouth 2 (two) times daily. Vitafusion.  . Probiotic Product (PROBIOTIC DAILY PO) Take 1 tablet by mouth 2 (two) times daily. Insync  . sennosides-docusate sodium (SENOKOT-S) 8.6-50 MG tablet Take 1 tablet by mouth daily.    EXAM:  BP 140/70  Temp(Src) 98.8 F (37.1 C) (Oral)  Ht 5' 8.5" (1.74 m)  Wt 297 lb 6.4 oz (134.9 kg)  BMI 44.56 kg/m2  Body mass index is 44.56 kg/(m^2).  GENERAL: vitals reviewed and listed above, alert, oriented, appears well hydrated and in no acute distress HEENT: atraumatic, conjunctiva  clear, no obvious abnormalities on inspection of external nose and ears OP : no lesion edema or exudate low palate  LUNGS: clear to auscultation bilaterally, no wheezes, rales or rhonchi,  CV: HRRR, MS: moves all extremities without noticeable focal  Abnormality.[name] PSYCH: pleasant and cooperative, no obvious depression or anxiety Lab Results  Component Value Date   WBC 11.0* 08/09/2013   HGB 13.2 08/09/2013   HCT 39.5 08/09/2013   PLT 372 08/09/2013   GLUCOSE 89 07/28/2013   CHOL 169 07/28/2013   TRIG 58.0 07/28/2013   HDL 28.60* 07/28/2013   LDLDIRECT 141.7 04/03/2011   LDLCALC 129* 07/28/2013   ALT 25 07/28/2013   AST 18 07/28/2013   NA 139 07/28/2013   K 4.3 07/28/2013   CL 102 07/28/2013   CREATININE 0.6 07/28/2013   BUN 12 07/28/2013   CO2 30 07/28/2013   TSH 2.48 07/28/2013    INR 0.98 08/02/2013    ASSESSMENT AND PLAN:  Discussed the following assessment and plan:  Sleep-disordered breathing - noted after surgery poss positional speel apnea other . no day time drowsiness related  refef for evla. - Plan: Ambulatory referral to Pulmonology  Morbid obesity  THYROID NODULE - Plan: Ambulatory referral to Endocrinology, US Soft Tissue Head/Neck  HYPERTENSION Hx thyroid nodule inconclusive dx   On no insuance  Plan referral to endo. -Patient advised to return or notify health care team  if symptoms worsen ,persist or new concerns arise.  Patient Instructions  I will do referral to sleep  Specialist    For the reasons  Discussed . Continue lifestyle intervention healthy eating and exercise . For blood prsesure control.     [name]. Panosh M.D.

## 2013-12-02 ENCOUNTER — Ambulatory Visit
Admission: RE | Admit: 2013-12-02 | Discharge: 2013-12-02 | Disposition: A | Discharge status: Home / Self Care | Payer: BC Managed Care – PPO | Source: Ambulatory Visit | Attending: Internal Medicine | Admitting: Internal Medicine

## 2013-12-02 DIAGNOSIS — E041 Nontoxic single thyroid nodule: Secondary | ICD-10-CM

## 2014-01-04 ENCOUNTER — Encounter: Payer: Self-pay | Admitting: Pulmonary Disease

## 2014-01-04 ENCOUNTER — Ambulatory Visit (INDEPENDENT_AMBULATORY_CARE_PROVIDER_SITE_OTHER): Payer: BC Managed Care – PPO | Admitting: Pulmonary Disease

## 2014-01-04 VITALS — BP 126/72 | HR 77 | Temp 98.7°F | Ht 68.5 in | Wt 299.6 lb

## 2014-01-04 DIAGNOSIS — G4733 Obstructive sleep apnea (adult) (pediatric): Secondary | ICD-10-CM

## 2014-01-04 NOTE — Assessment & Plan Note (Signed)
The patient's history is suggestive of sleep disordered breathing, and I have had a long discussion with her about the pathophysiology of sleep apnea. I think she is a reasonable candidate for home sleep testing, and the patient is agreeable to this approach. I will arrange for followup once the results are available.

## 2014-01-04 NOTE — Progress Notes (Signed)
Subjective:    Patient ID: Amy Stokes, female    DOB: May 09, 1950, 63 y.o.   MRN: [id number]  HPI The patient is a 63 year old female who I've been asked to see for possible obstructive sleep apnea. She has been noted to have loud snoring by her bed partner, and also witnessed apneas during the night. She has had recent surgery where she was noted to have witnessed apneas in the postanesthesia care unit. The patient feels that she is a very restless sleeper, but typically feels adequately rested in the mornings upon arising. She describes intermittent sleep pressure during the day with inactivity, but it is not consistent. She does fairly well in the evenings watching TV or reading, unless it is close to her bedtime.  She has no issues with sleepiness while driving.  The patient states that her weight is neutral over the last 2 years, and her Epworth score today is only 5.   Sleep Questionnaire What time do you typically go to bed?( Between what hours) 10-11 PM 10-11 PM at 1439 on 01/04/14 by Inge Rise, CMA How long does it take you to fall asleep? 8-12 minutes 8-12 minutes at 1439 on 01/04/14 by Inge Rise, CMA How many times during the night do you wake up? 1 1 at 1439 on 01/04/14 by Inge Rise, CMA What time do you get out of bed to start your day? 0530 0530 at 1439 on 01/04/14 by Inge Rise, CMA Do you drive or operate heavy machinery in your occupation? No No at 1439 on 01/04/14 by Inge Rise, CMA How much has your weight changed (up or down) over the past two years? (In pounds) 0 oz (0 kg) 0 oz (0 kg) at 1439 on 01/04/14 by Inge Rise, CMA Have you ever had a sleep study before? No No at 1439 on 01/04/14 by Inge Rise, CMA Do you currently use CPAP? No No at 1439 on 01/04/14 by Inge Rise, CMA Do you wear oxygen at any time? No    Review of Systems  Constitutional: Negative for fever and unexpected weight change.  HENT: Negative for  congestion, dental problem, ear pain, nosebleeds, postnasal drip, rhinorrhea, sinus pressure, sneezing, sore throat and trouble swallowing.   Eyes: Negative for redness and itching.  Respiratory: Negative for cough, chest tightness, shortness of breath and wheezing.   Cardiovascular: Negative for palpitations and leg swelling.  Gastrointestinal: Negative for nausea and vomiting.  Genitourinary: Negative for dysuria.  Musculoskeletal: Negative for joint swelling.  Skin: Negative for rash.  Neurological: Negative for headaches.  Hematological: Does not bruise/bleed easily.  Psychiatric/Behavioral: Negative for dysphoric mood. The patient is not nervous/anxious.        Objective:   Physical Exam Constitutional:  Obese female, no acute distress  HENT:  Nares patent without discharge, mod septal deviation to the left  Oropharynx without exudate, palate is elongated and uvula is normal   Eyes:  Perrla, eomi, no scleral icterus  Neck:  No JVD, no TMG  Cardiovascular:  Normal rate, regular rhythm, no rubs or gallops.  No murmurs        Intact distal pulses  Pulmonary :  Normal breath sounds, no stridor or respiratory distress   No rales, rhonchi, or wheezing  Abdominal:  Soft, nondistended, bowel sounds present.  No tenderness noted.   Musculoskeletal:  1+ lower extremity edema noted.  Lymph Nodes:  No cervical lymphadenopathy noted  Skin:  No cyanosis  noted  Neurologic:  Alert, appropriate, moves all 4 extremities without obvious deficit.         Assessment & Plan:

## 2014-01-04 NOTE — Patient Instructions (Signed)
Will schedule for a home sleep test, and be sure and let us know if you have a bad night sleeping that night.

## 2014-01-18 ENCOUNTER — Ambulatory Visit (INDEPENDENT_AMBULATORY_CARE_PROVIDER_SITE_OTHER): Payer: BC Managed Care – PPO | Admitting: Internal Medicine

## 2014-01-18 ENCOUNTER — Encounter: Payer: Self-pay | Admitting: Internal Medicine

## 2014-01-18 VITALS — BP 124/68 | HR 87 | Temp 98.2°F | Resp 12 | Ht 68.0 in | Wt 298.0 lb

## 2014-01-18 DIAGNOSIS — E042 Nontoxic multinodular goiter: Secondary | ICD-10-CM

## 2014-01-18 NOTE — Patient Instructions (Signed)
You will be called with the biopsy schedule.  I will let you know about the results through [place]. Please return in 1 year.  Thyroid Biopsy The thyroid gland is a butterfly-shaped gland situated in the front of the neck. It produces hormones which affect metabolism, growth and development, and body temperature. A thyroid biopsy is a procedure in which small samples of tissue or fluid are removed from the thyroid gland or mass and examined under a microscope. This test is done to determine the cause of thyroid problems, such as infection, cancer, or other thyroid problems. There are 2 ways to obtain samples: 1. Fine needle biopsy. Samples are removed using a thin needle inserted through the skin and into the thyroid gland or mass. 2. Open biopsy. Samples are removed after a cut (incision) is made through the skin. LET YOUR CAREGIVER KNOW ABOUT:   Allergies.  Medications taken including herbs, eye drops, over-the-counter medications, and creams.  Use of steroids (by mouth or creams).  Previous problems with anesthetics or numbing medicine.  Possibility of pregnancy, if this applies.  History of blood clots (thrombophlebitis).  History of bleeding or blood problems.  Previous surgery.  Other health problems. RISKS AND COMPLICATIONS  Bleeding from the site. The risk of bleeding is higher if you have a bleeding disorder or are taking any blood thinning medications (anticoagulants).  Infection.  Injury to structures near the thyroid gland. BEFORE THE PROCEDURE  This is a procedure that can be done as an outpatient. Confirm the time that you need to arrive for your procedure. Confirm whether there is a need to fast or withhold any medications. A blood sample may be done to determine your blood clotting time. Medicine may be given to help you relax (sedative). PROCEDURE Fine needle biopsy. You will be awake during the procedure. You may be asked to lie on your back with your head  tipped backward to extend your neck. Let your caregiver know if you cannot tolerate the positioning. An area on your neck will be cleansed. A needle is inserted through the skin of your neck. You may feel a mild discomfort during this procedure. You may be asked to avoid coughing, talking, swallowing, or making sounds during some portions of the procedure. The needle is withdrawn once tissue or fluid samples have been removed. Pressure may be applied to the neck to reduce swelling and ensure that bleeding has stopped. The samples will be sent for examination.  Open biopsy. You will be given general anesthesia. You will be asleep during the procedure. An incision is made in your neck. A sample of thyroid tissue or the mass is removed. The tissue sample or mass will be sent for examination. The sample or mass may be examined during the biopsy. If the sample or mass contains cancer cells, some or all of the thyroid gland may be removed. The incision is closed with stitches. AFTER THE PROCEDURE  Your recovery will be assessed and monitored. If there are no problems, as an outpatient, you should be able to go home shortly after the procedure. If you had a fine needle biopsy:  You may have soreness at the biopsy site for 1 to 2 days. If you had an open biopsy:   You may have soreness at the biopsy site for 3 to 4 days.  You may have a hoarse voice or sore throat for 1 to 2 days. Obtaining the Test Results It is your responsibility to obtain your test results. Do  not assume everything is normal if you have not heard from your caregiver or the medical facility. It is important for you to follow up on all of your test results. HOME CARE INSTRUCTIONS   Keeping your head raised on a pillow when you are lying down may ease biopsy site discomfort.  Supporting the back of your head and neck with both hands as you sit up from a lying position may ease biopsy site discomfort.  Only take over-the-counter or  prescription medicines for pain, discomfort, or fever as directed by your caregiver.  Throat lozenges or gargling with warm salt water may help to soothe a sore throat. SEEK IMMEDIATE MEDICAL CARE IF:   You have severe bleeding from the biopsy site.  You have difficulty swallowing.  You have a fever.  You have increased pain, swelling, redness, or warmth at the biopsy site.  You notice pus coming from the biopsy site.  You have swollen glands (lymph nodes) in your neck. Document Released: 01/12/2007 Document Revised: 07/12/2012 Document Reviewed: 06/09/2013 [facility] Patient Information 2015 [place], [place]. This information is not intended to replace advice given to you by your health care provider. Make sure you discuss any questions you have with your health care provider.

## 2014-01-18 NOTE — Progress Notes (Addendum)
Patient ID: Amy Stokes, female   DOB: 23-Feb-1951, 63 y.o.   MRN: [id number]   HPI  Amy Stokes is a 63 y.o.-year-old female, referred by her PCP, Dr. Regis Bill, in consultation for MNG.  She developed bronchitis in 2009 >> CT scan in ED >> incidentally found thyroid nodules.  Thyroid U/S (12/02/2013): 4 larger thyroid nodules: There is unchanged mild diffuse heterogeneity of the thyroid parenchymal echotexture. No new or enlarging thyroid nodules.   Right thyroid lobe: Borderline enlarged measuring 5.2 x 2.0 x 1.6 cm, unchanged, previously, 4.7 x 1.9 x 1.9 cm.  - Right, superior - 1.1 x 0.8 x 0.7 cm - mixed echogenic, solid - grossly unchanged, previously, 1.0 x 0.6 x 0.8 cm.  - Right, mid, posterior - 1.0 x 0.8 x 0.6 cm - anechoic, cystic - grossly unchanged, previously, 0.8 x 0.8 x 0.6 cm with slight differences likely attributable to scan plane.  - Right, superior, lateral - 0.2 cm - echogenic, shadowing, likely a dystrophic calcification.   Left thyroid lobe: Normal in size measuring 4.7 x 2.4 x 2.5 cm, unchanged, previously, 4.6 x 2.1 x 2.4 cm. No nodules visualized.  - Left, superior - 3.1 x 1.7 x 2.1 cm - mixed echogenic, partially cystic, predominantly solid - unchanged to minimally decreased in size, previously, 3.5 x 1.6 x 2.3 cm - note, this nodule was previously biopsied.  - Left, mid, posterior - 1.1 x 1.0 x 0.9 cm- mixed echogenic, solid, unchanged to minimally decreased in size in interval, previously, 1.2 x 0.9 x 1.3 cm.  Isthmus Thickness: Enlarged measuring 0.6 cm in diameter, grossly unchanged. No discrete nodules are identified within the thyroid isthmus.   Lymphadenopathy None visualized.  IMPRESSION:  Grossly unchanged findings compatible with multi nodular goiter, including the previously biopsied dominant approximately 3.1 cm nodule   The 3.1 cm L nodule was previously Bx'ed in 02/06/2009: FLUS  I reviewed pt's thyroid tests: Lab Results  Component Value  Date   TSH 2.48 07/28/2013   TSH 3.07 04/03/2011   TSH 2.88 07/16/2009   TSH 2.71 05/10/2008   TSH 2.41 01/20/2008   TSH 3.70 03/26/2007   FREET4 1.0 07/16/2009   FREET4 1.2 01/20/2008   FREET4 1.1 03/26/2007    Pt denies feeling nodules in neck, hoarseness, dysphagia/odynophagia, SOB with lying down.  Pt c/o: - + palpitations - chronic, occasional - no heat intolerance/cold intolerance - no tremors - no anxiety/depression - no hyperdefecation/constipation - no weight gain/loss - no dry skin - no hair falling - no fatigue  Pt does have a FH of thyroid ds. : sister with thyroid nodule. No FH of thyroid cancer. No h/o radiation tx to head or neck.  No seaweed or kelp, no recent contrast studies. No steroid use. No herbal supplements.   I reviewed her chart and she also has a history of HTN, HL, asthma. She will have a sleep study (at home).  ROS: Constitutional: see HPI, + poor sleep Eyes: no blurry vision, no xerophthalmia ENT: no sore throat, no nodules palpated in throat, no dysphagia/odynophagia, no hoarseness, + tinnitus Cardiovascular: no CP/SOB/+ palpitations/+ leg swelling Respiratory: no cough/SOB/+ wheezing Gastrointestinal: no N/V/D/C Musculoskeletal: no muscle/joint aches Skin: no rashes Neurological: no tremors/numbness/tingling/dizziness Psychiatric: no depression/anxiety + low libido  Past Medical History  Diagnosis Date  . Hypertension   . Obesity   . Fibroids   . Edema   . Asthma   . Hyperlipidemia   . Thyroid nodule 01/2009  left discovered on chest ct; needle bx "indeterminant" cannon r/o follicular neoplasm    Past Surgical History  Procedure Laterality Date  . Thyroid needle bx    . Mouth surgery    . Tonsillectomy    . Abdominal hysterectomy Bilateral 08/02/2013    Procedure: TOTAL ABDOMINAL HYSTERECTOMY WITH BILATERAL SALPINGO-OOPHORECTOMY ;  Surgeon: Terrance Mass, MD;  Location: [facility];  Service: Gynecology;  Laterality: Bilateral;     Dr. Phineas Real to assist.  Dr. Zella Richer to follow with an umbilical hernia repair. He will need an additional 1 1/2 hours.   . Umbilical hernia repair N/A 08/02/2013    Procedure: HERNIA REPAIR UMBILICAL ADULT WITH MESH;  Surgeon: Odis Hollingshead, MD;  Location: [facility];  Service: General;  Laterality: N/A;   History   Social History  . Marital Status: Married    Spouse Name: N/A    Number of Children: 0   Occupational History  . retired    Social History Main Topics  . Smoking status: Former Smoker -- 1.00 packs/day for 1 years    Types: Cigarettes    Quit date: 04/29/1971  . Smokeless tobacco: Never Used  . Alcohol Use: Yes     Comment: occ  . Drug Use: No  . Sexual Activity: No   Other Topics Concern  . Not on file   Social History Narrative   Married   Occupation: Chiropractor currently unemployed   Married   Regular exercise-no   [place]   Pet cat   Current Outpatient Prescriptions on File Prior to Visit  Medication Sig Dispense Refill  . albuterol (PROAIR HFA) 108 (90 BASE) MCG/ACT inhaler Inhale 2 puffs into the lungs every 6 (six) hours as needed for wheezing.  1 Inhaler  2  . aspirin 81 MG tablet Take 81 mg by mouth daily.      . Fluticasone-Salmeterol (ADVAIR DISKUS) 100-50 MCG/DOSE AEPB Inhale 1 puff into the lungs 2 (two) times daily. Or as directed  1 each  11  . loratadine (CLARITIN) 10 MG tablet Take 10 mg by mouth daily.      [name] losartan (COZAAR) 100 MG tablet Take 100 mg by mouth daily. For hypertension      . MULTIPLE VITAMIN PO Take 1 tablet by mouth 2 (two) times daily. Vitafusion.      . Probiotic Product (PROBIOTIC DAILY PO) Take 1 tablet by mouth 2 (two) times daily. Insync       No current facility-administered medications on file prior to visit.   No Known Allergies Family History  Problem Relation Age of Onset  . Arthritis Mother   . Other Mother     neurological disorder  . Pancreatic cancer Mother   . Heart disease Father   .  Thyroid nodules Sister   . Heart disease Brother     x2 1 brother deceased   PE: BP 124/68  Pulse 87  Temp(Src) 98.2 F (36.8 C) (Oral)  Resp 12  Ht 5\' 8"  (1.727 m)  Wt 298 lb (135.172 kg)  BMI 45.32 kg/m2  SpO2 95% Wt Readings from Last 3 Encounters:  01/18/14 298 lb (135.172 kg)  01/04/14 299 lb 9.6 oz (135.898 kg)  12/01/13 297 lb 6.4 oz (134.9 kg)   Constitutional: overweight, in NAD Eyes: PERRLA, EOMI, no exophthalmos ENT: moist mucous membranes, no thyromegaly, no thyroid nodules palpable, no cervical lymphadenopathy Cardiovascular: RRR, No MRG, + BLE edema, pitting +2 Respiratory: CTA B Gastrointestinal: abdomen soft,  NT, ND, BS+ Musculoskeletal: no deformities, strength intact in all 4;  Skin: moist, warm, no rashes Neurological: no tremor with outstretched hands, DTR normal in all 4  ASSESSMENT: 1. MNG - thyroid U/S:   PLAN: 1. MNG  - I reviewed the images of her thyroid ultrasound along with the patient. I pointed out that the dominant nodule is large, this being a risk factor for cancer. This nodule was Bx'ed in 2010 >> FLUS. The Bethesda classification and guidelines recommend repeat Bx in 6-12 mo after the original Bx >> I suggested to repeat this Bx now. She agrees. - The other nodules are small, without calcifications, without internal blood flow, more wide than tall.  - Pt does not have a thyroid cancer family history or a personal history of RxTx to head/neck.  - I explained that this is not cancer, we can continue to follow her on a yearly basis, and check another ultrasound in another year or 2. - we discussed about different possible outcomes of the Bx and further plan. She agrees with referral to surgery if the Bx returns as FLUS or thyroid cancer. I explained the procedure to do lobectomy if Bx shows FLUS (and may need completion thyroidectomy afterwards if the final pathology: ThyCa) or  total thyroidectomy if the Bx returns as ThyCa. - I'll see her  back in a year, assuming her FNA is normal. If FNA abnormal, we will meet sooner.  - I advised pt to join my chart and I will send her the results through there   I will addend the results when they become available.  Adequacy Reason Satisfactory For Evaluation. Diagnosis THYROID, FINE NEEDLE ASPIRATION LEFT, (SPECIMEN 1 OF 1 COLLECTED ON 16/38/4665) FOLLICULAR LESION OF UNDETERMINED SIGNIFICANCE (BETHESDA CATEGORY III). SEE COMMENT. COMMENT: THE SPECIMEN CONSISTS PREDOMINANTLY OF FOLLICULAR EPITHELIAL CELLS ARRANGED AS MICROFOLLICLES. THERE IS MILD CYTOLOGIC ATYPIA AND SOME FOCI WHICH APPEAR TO HAVE A PAPILLARY ARCHITECTURE. BASED ON THESE FEATURES, A FOLLICULAR NEOPLASM/LESION CAN NOT BE ENTIRELY RULED OUT. Enid Cutter MD Pathologist, Electronic Signature (Case signed 01/25/2014) Specimen Clinical Information Follow-up thyroid nodule, History of prior biopsy, Prev bx 02/06/2009, Left, superior - 3.1 x 1.7 x 2.1cm - mixed echogenic, partially cystic, predominantly solid - unchanged to minimally decreased in size, previously, 3.5 x 1.6 x 2.3cm - note, this nodule was previously biopsied  Patient had now 2 biopsies showing FLUS >> I would suggest left thyroidectomy. I called and discussed with patient about the results of the Bx and potential referral to surgery. She wants to research this first and will let me know.

## 2014-01-24 ENCOUNTER — Other Ambulatory Visit (HOSPITAL_COMMUNITY)
Admission: RE | Admit: 2014-01-24 | Discharge: 2014-01-24 | Disposition: A | Discharge status: Home / Self Care | Payer: BC Managed Care – PPO | Source: Ambulatory Visit | Attending: Interventional Radiology | Admitting: Interventional Radiology

## 2014-01-24 ENCOUNTER — Ambulatory Visit
Admission: RE | Admit: 2014-01-24 | Discharge: 2014-01-24 | Disposition: A | Discharge status: Home / Self Care | Payer: BC Managed Care – PPO | Source: Ambulatory Visit | Attending: Internal Medicine | Admitting: Internal Medicine

## 2014-01-24 DIAGNOSIS — E041 Nontoxic single thyroid nodule: Secondary | ICD-10-CM | POA: Insufficient documentation

## 2014-01-30 ENCOUNTER — Encounter: Payer: Self-pay | Admitting: Internal Medicine

## 2014-02-12 ENCOUNTER — Encounter: Payer: Self-pay | Admitting: Internal Medicine

## 2014-02-12 DIAGNOSIS — E041 Nontoxic single thyroid nodule: Secondary | ICD-10-CM | POA: Insufficient documentation

## 2014-02-13 DIAGNOSIS — G4733 Obstructive sleep apnea (adult) (pediatric): Secondary | ICD-10-CM

## 2014-02-17 ENCOUNTER — Telehealth: Payer: Self-pay | Admitting: Pulmonary Disease

## 2014-02-17 DIAGNOSIS — G4733 Obstructive sleep apnea (adult) (pediatric): Secondary | ICD-10-CM | POA: Diagnosis not present

## 2014-02-17 NOTE — Telephone Encounter (Signed)
Called pt and appt scheduled to see [facility] on Tuesday. Nothing further needed

## 2014-02-17 NOTE — Telephone Encounter (Signed)
[email or web address] x1 for pt 

## 2014-02-17 NOTE — Telephone Encounter (Signed)
Patient needs ov to review sleep study.

## 2014-02-17 NOTE — Telephone Encounter (Signed)
Pt is returning call & can be reached at [phone].  Satira Anis

## 2014-02-20 ENCOUNTER — Encounter: Payer: Self-pay | Admitting: Pulmonary Disease

## 2014-02-21 ENCOUNTER — Encounter: Payer: Self-pay | Admitting: Pulmonary Disease

## 2014-02-21 ENCOUNTER — Ambulatory Visit (INDEPENDENT_AMBULATORY_CARE_PROVIDER_SITE_OTHER): Payer: BC Managed Care – PPO | Admitting: Pulmonary Disease

## 2014-02-21 VITALS — BP 122/62 | HR 87 | Temp 97.1°F | Ht 68.5 in | Wt 302.0 lb

## 2014-02-21 DIAGNOSIS — G4733 Obstructive sleep apnea (adult) (pediatric): Secondary | ICD-10-CM

## 2014-02-21 NOTE — Progress Notes (Signed)
   Subjective:    Patient ID: Amy Stokes, female    DOB: [birth year], 63 y.o.   MRN: [id number]  HPI Patient comes in today for follow-up of her recent home sleep test. She was found to have mild to moderate OSA, with an AHI of 17 events per hour. I have reviewed the study with her in detail, and answered all of her questions.   Review of Systems  Constitutional: Negative for fever and unexpected weight change.  HENT: Negative for congestion, dental problem, ear pain, nosebleeds, postnasal drip, rhinorrhea, sinus pressure, sneezing, sore throat and trouble swallowing.   Eyes: Negative for redness and itching.  Respiratory: Negative for cough, chest tightness, shortness of breath and wheezing.   Cardiovascular: Negative for palpitations and leg swelling.  Gastrointestinal: Negative for nausea and vomiting.  Genitourinary: Negative for dysuria.  Musculoskeletal: Negative for joint swelling.  Skin: Negative for rash.  Neurological: Negative for headaches.  Hematological: Does not bruise/bleed easily.  Psychiatric/Behavioral: Negative for dysphoric mood. The patient is not nervous/anxious.        Objective:   Physical Exam Obese female in no acute distress Nose without purulence or discharge noted Neck without lymphadenopathy or thyromegaly Lower extremities with edema noted, no cyanosis Alert and oriented, moves all 4 extremities.       Assessment & Plan:

## 2014-02-21 NOTE — Assessment & Plan Note (Signed)
The patient has mild to moderate obstructive sleep apnea by her home sleep test, and I have discussed with her the various options for treatment. She can take a more conservative approach with a trial of weight loss alone, or a more aggressive approach with either a dental appliance or CPAP. After a discussion, the patient has decided to consider a dental appliance, and I will therefore refer her to dental medicine for evaluation.

## 2014-02-21 NOTE — Patient Instructions (Signed)
Will refer to dental medicine for consideration of a dental appliance. Work on weight loss Please call me if you change your mind and wish to try cpap, or if the appliance is not effective for you.

## 2014-04-03 ENCOUNTER — Encounter: Payer: Self-pay | Admitting: Internal Medicine

## 2014-04-03 ENCOUNTER — Ambulatory Visit (INDEPENDENT_AMBULATORY_CARE_PROVIDER_SITE_OTHER): Payer: 59 | Admitting: Internal Medicine

## 2014-04-03 VITALS — BP 130/80 | Temp 98.1°F | Ht 68.0 in | Wt 293.0 lb

## 2014-04-03 DIAGNOSIS — Z0000 Encounter for general adult medical examination without abnormal findings: Secondary | ICD-10-CM

## 2014-04-03 DIAGNOSIS — E786 Lipoprotein deficiency: Secondary | ICD-10-CM

## 2014-04-03 DIAGNOSIS — I10 Essential (primary) hypertension: Secondary | ICD-10-CM

## 2014-04-03 DIAGNOSIS — Z6841 Body Mass Index (BMI) 40.0 and over, adult: Secondary | ICD-10-CM

## 2014-04-03 DIAGNOSIS — J45909 Unspecified asthma, uncomplicated: Secondary | ICD-10-CM

## 2014-04-03 NOTE — Patient Instructions (Addendum)
Healthy weight loss   Is a modifiable riskj factor for your health including cholesterol and blood pressure  Healthy lifestyle includes : At least 150 minutes of exercise weeks  , weight at healthy levels, which is usually   BMI 19-25. Avoid trans fats and processed foods;  Increase fresh fruits and veges to 5 servings per day. And avoid sweet beverages including tea and juice. Mediterranean diet with olive oil and nuts have been noted to be heart and brain healthy . Avoid tobacco products . Limit  alcohol to  7 per week for women and 14 servings for men.  Get adequate sleep . Wear seat belts . Don't text and drive .       Why follow it? Research shows. . Those who follow the Mediterranean diet have a reduced risk of heart disease  . The diet is associated with a reduced incidence of Parkinson's and Alzheimer's diseases . People following the diet may have longer life expectancies and lower rates of chronic diseases  . The Dietary Guidelines for Americans recommends the Mediterranean diet as an eating plan to promote health and prevent disease  What Is the Mediterranean Diet?  . Healthy eating plan based on typical foods and recipes of Mediterranean-style cooking . The diet is primarily a plant based diet; these foods should make up a majority of meals   Starches - Plant based foods should make up a majority of meals - They are an important sources of vitamins, minerals, energy, antioxidants, and fiber - Choose whole grains, foods high in fiber and minimally processed items  - Typical grain sources include wheat, oats, barley, corn, brown rice, bulgar, farro, millet, polenta, couscous  - Various types of beans include chickpeas, lentils, fava beans, black beans, white beans   Fruits  Veggies - Large quantities of antioxidant rich fruits & veggies; 6 or more servings  - Vegetables can be eaten raw or lightly drizzled with oil and cooked  - Vegetables common to the traditional  Mediterranean Diet include: artichokes, arugula, beets, broccoli, brussel sprouts, cabbage, carrots, celery, collard greens, cucumbers, eggplant, kale, leeks, lemons, lettuce, mushrooms, okra, onions, peas, peppers, potatoes, pumpkin, radishes, rutabaga, shallots, spinach, sweet potatoes, turnips, zucchini - Fruits common to the Mediterranean Diet include: apples, apricots, avocados, cherries, clementines, dates, figs, grapefruits, grapes, melons, nectarines, oranges, peaches, pears, pomegranates, strawberries, tangerines  Fats - Replace butter and margarine with healthy oils, such as olive oil, canola oil, and tahini  - Limit nuts to no more than a handful a day  - Nuts include walnuts, almonds, pecans, pistachios, pine nuts  - Limit or avoid candied, honey roasted or heavily salted nuts - Olives are central to the [employer] - can be eaten whole or used in a variety of dishes   Meats Protein - Limiting red meat: no more than a few times a month - When eating red meat: choose lean cuts and keep the portion to the size of deck of cards - Eggs: approx. 0 to 4 times a week  - Fish and lean poultry: at least 2 a week  - Healthy protein sources include, chicken, [name], lean beef, lamb - Increase intake of seafood such as tuna, salmon, trout, mackerel, shrimp, scallops - Avoid or limit high fat processed meats such as sausage and bacon  Dairy - Include moderate amounts of low fat dairy products  - Focus on healthy dairy such as fat free yogurt, skim milk, low or reduced fat cheese - Limit dairy  products higher in fat such as whole or 2% milk, cheese, ice cream  Alcohol - Moderate amounts of red wine is ok  - No more than 5 oz daily for women (all ages) and men older than age 49  - No more than 10 oz of wine daily for men younger than 66  Other - Limit sweets and other desserts  - Use herbs and spices instead of salt to flavor foods  - Herbs and spices common to the traditional Mediterranean  Diet include: basil, bay leaves, chives, cloves, cumin, fennel, garlic, lavender, marjoram, mint, oregano, parsley, pepper, rosemary, sage, savory, sumac, tarragon, thyme   It's not just a diet, it's a lifestyle:  . The Mediterranean diet includes lifestyle factors typical of those in the region  . Foods, drinks and meals are best eaten with others and savored . Daily physical activity is important for overall good health . This could be strenuous exercise like running and aerobics . This could also be more leisurely activities such as walking, housework, yard-work, or taking the stairs . Moderation is the key; a balanced and healthy diet accommodates most foods and drinks . Consider portion sizes and frequency of consumption of certain foods   Meal Ideas & Options:  . Breakfast:  o Whole wheat toast or whole wheat English muffins with peanut butter & hard boiled egg o Steel cut oats topped with apples & cinnamon and skim milk  o Fresh fruit: banana, strawberries, melon, berries, peaches  o Smoothies: strawberries, bananas, greek yogurt, peanut butter o Low fat greek yogurt with blueberries and granola  o Egg white omelet with spinach and mushrooms o Breakfast couscous: whole wheat couscous, apricots, skim milk, cranberries  . Sandwiches:  o Hummus and grilled vegetables (peppers, zucchini, squash) on whole wheat bread   o Grilled chicken on whole wheat pita with lettuce, tomatoes, cucumbers or tzatziki  o Tuna salad on whole wheat bread: tuna salad made with greek yogurt, olives, red peppers, capers, green onions o Garlic rosemary lamb pita: lamb sauted with garlic, rosemary, salt & pepper; add lettuce, cucumber, greek yogurt to pita - flavor with lemon juice and black pepper  . Seafood:  o Mediterranean grilled salmon, seasoned with garlic, basil, parsley, lemon juice and black pepper o Shrimp, lemon, and spinach whole-grain pasta salad made with low fat greek yogurt  o Seared scallops  with lemon orzo  o Seared tuna steaks seasoned salt, pepper, coriander topped with tomato mixture of olives, tomatoes, olive oil, minced garlic, parsley, green onions and cappers  . Meats:  o Herbed greek chicken salad with kalamata olives, cucumber, feta  o Red bell peppers stuffed with spinach, bulgur, lean ground beef (or lentils) & topped with feta   o Kebabs: skewers of chicken, tomatoes, onions, zucchini, squash  o [name] burgers: made with red onions, mint, dill, lemon juice, feta cheese topped with roasted red peppers . Vegetarian o Cucumber salad: cucumbers, artichoke hearts, celery, red onion, feta cheese, tossed in olive oil & lemon juice  o Hummus and whole grain pita points with a greek salad (lettuce, tomato, feta, olives, cucumbers, red onion) o Lentil soup with celery, carrots made with vegetable broth, garlic, salt and pepper  o Tabouli salad: parsley, bulgur, mint, scallions, cucumbers, tomato, radishes, lemon juice, olive oil, salt and pepper.   Wt Readings from Last 3 Encounters:  04/03/14 293 lb (132.904 kg)  02/21/14 302 lb (136.986 kg)  01/18/14 298 lb (135.172 kg)

## 2014-04-03 NOTE — Progress Notes (Signed)
Pre visit review using our clinic review tool, if applicable. No additional management support is needed unless otherwise documented below in the visit note.  Chief Complaint  Patient presents with  . Annual Exam    allergy asthma meds  . Hypertension    HPI: Patient  Amy Stokes  64 y.o. comes in today for Preventive Health Care visit . And Chronic disease management   OSA  Device oral device for days.   MNG  Second bx inconclusive .   Cost issue. Advised removal.    Bp ok  130 range . No med problem  Asthma  Same  Getting over a cold   Rescue not at all.  Bur stays on controller med  Had hysterectomy this year   Health Maintenance  Topic Date Due  . ZOSTAVAX  03/31/2011  . PAP SMEAR  02/10/2014  . MAMMOGRAM  08/30/2014  . INFLUENZA VACCINE  10/30/2014  . COLONOSCOPY  12/26/2017  . TETANUS/TDAP  07/29/2023   Health Maintenance Review LIFESTYLE:  Exercise:  walking some  Tobacco/ETS: no  Alcohol: ocass  Sugar beverages:  Some  Small amount  min Sleep: fit bit osa dental appliance  Drug use: no Colonoscopy:  utd  2019 due  PAP: hysterectomy .   MAMMO:utd.  ROS:  GEN/ HEENT: No fever, significant weight changes sweats headaches vision problems hearing changes, CV/ PULM; No chest pain shortness of breath cough, syncope,edema  change in exercise tolerance. GI /GU: No adominal pain, vomiting, change in bowel habits. No blood in the stool. No significant GU symptoms. SKIN/HEME: ,no acute skin rashes suspicious lesions or bleeding. No lymphadenopathy, nodules, masses.  NEURO/ PSYCH:  No neurologic signs such as weakness numbness. No depression anxiety. IMM/ Allergy: No unusual infections.  Allergy .   REST of 12 system review negative except as per HPI   Past Medical History  Diagnosis Date  . Hypertension   . Obesity   . Fibroids   . Edema   . Asthma   . Hyperlipidemia   . Thyroid nodule 01/2009    left discovered on chest ct; needle bx "indeterminant"  cannon r/o follicular neoplasm     Past Surgical History  Procedure Laterality Date  . Thyroid needle bx    . Mouth surgery    . Tonsillectomy    . Abdominal hysterectomy Bilateral 08/02/2013    Procedure: TOTAL ABDOMINAL HYSTERECTOMY WITH BILATERAL SALPINGO-OOPHORECTOMY ;  Surgeon: Terrance Mass, MD;  Location: [facility];  Service: Gynecology;  Laterality: Bilateral;    Dr. Phineas Real to assist.  Dr. Zella Richer to follow with an umbilical hernia repair. He will need an additional 1 1/2 hours.   . Umbilical hernia repair N/A 08/02/2013    Procedure: HERNIA REPAIR UMBILICAL ADULT WITH MESH;  Surgeon: Odis Hollingshead, MD;  Location: [facility];  Service: General;  Laterality: N/A;    Family History  Problem Relation Age of Onset  . Arthritis Mother   . Other Mother     neurological disorder  . Pancreatic cancer Mother   . Heart disease Father   . Thyroid nodules Sister   . Heart disease Brother     x2 1 brother deceased    History   Social History  . Marital Status: Married    Spouse Name: N/A    Number of Children: 0  . Years of Education: N/A   Occupational History  . retired    Social History Main Topics  . Smoking status: Former Smoker --  1.00 packs/day for 1 years    Types: Cigarettes    Quit date: 04/29/1971  . Smokeless tobacco: Never Used  . Alcohol Use: Yes     Comment: occ  . Drug Use: No  . Sexual Activity: No   Other Topics Concern  . None   Social History Narrative   Married   Occupation: Chiropractor currently unemployed   Married   Regular exercise-no   [place] of [address]   Pet cat          Outpatient Encounter Prescriptions as of 04/03/2014  Medication Sig  . albuterol (PROAIR HFA) 108 (90 BASE) MCG/ACT inhaler Inhale 2 puffs into the lungs every 6 (six) hours as needed for wheezing.  [name] aspirin 81 MG tablet Take 81 mg by mouth daily.  . Fluticasone-Salmeterol (ADVAIR DISKUS) 100-50 MCG/DOSE AEPB Inhale 1 puff into the lungs 2 (two) times daily. Or as  directed  . loratadine (CLARITIN) 10 MG tablet Take 10 mg by mouth daily.  [name] losartan (COZAAR) 100 MG tablet Take 100 mg by mouth daily. For hypertension  . MULTIPLE VITAMIN PO Take 1 tablet by mouth 2 (two) times daily. Vitafusion.  . Probiotic Product (PROBIOTIC DAILY PO) Take 1 tablet by mouth 2 (two) times daily. Insync    EXAM:  BP 130/80 mmHg  Temp(Src) 98.1 F (36.7 C) (Oral)  Ht 5\' 8"  (1.727 m)  Wt 293 lb (132.904 kg)  BMI 44.56 kg/m2  Body mass index is 44.56 kg/(m^2).  Physical Exam: Vital signs reviewed WUJ:WJXB is a well-developed well-nourished alert cooperative    who appearsr stated age in no acute distress.  HEENT: normocephalic atraumatic , Eyes: PERRL EOM's full, conjunctiva clear, Nares: paten,t no deformity discharge or tenderness., Ears: no deformity EAC's clear TMs with normal landmarks. Mouth: clear OP, no lesions, edema.  Moist mucous membranes. Dentition in adequate repair. NECK: supple throidor bruits.palpable Breast: normal by inspection . No dimpling, discharge, masses, tenderness or discharge . CV: PMI is nondisplaced, S1 S2 no gallops, murmurs, rubs. Peripheral pulses are full without delay.No JVD .  ABDOMEN: Bowel sounds normal nontender  No guard or rebound, no hepato splenomegal no CVA tenderness.  No hernia. Extremtities:  No clubbing cyanosis or edema, no acute joint swelling or redness no focal atrophy NEURO:  Oriented x3, cranial nerves 3-12 appear to be intact, no obvious focal weakness,gait within normal limits no abnormal reflexes or asymmetrical SKIN: No acute rashes normal turgor, color, no bruising or petechiae. PSYCH: Oriented, good eye contact, no obvious depression anxiety, cognition and judgment appear normal. LN: no cervical axillary inguinal adenopathy  Lab Results  Component Value Date   WBC 11.0* 08/09/2013   HGB 13.2 08/09/2013   HCT 39.5 08/09/2013   PLT 372 08/09/2013   GLUCOSE 89 07/28/2013   CHOL 169 07/28/2013   TRIG 58.0  07/28/2013   HDL 28.60* 07/28/2013   LDLDIRECT 141.7 04/03/2011   LDLCALC 129* 07/28/2013   ALT 25 07/28/2013   AST 18 07/28/2013   NA 139 07/28/2013   K 4.3 07/28/2013   CL 102 07/28/2013   CREATININE 0.6 07/28/2013   BUN 12 07/28/2013   CO2 30 07/28/2013   TSH 2.48 07/28/2013   INR 0.98 08/02/2013    ASSESSMENT AND PLAN:  Discussed the following assessment and plan:  Visit for preventive health examination  Morbid obesity - counseld weight loss would help her health  Low HDL (under 40)  Essential hypertension - controlled  Morbid obesity with BMI of  40.0-44.9, adult  Asthma, unspecified asthma severity, uncomplicated OSA Patient Care Team: Burnis Medin, MD as PCP - General Patient Instructions   Healthy weight loss   Is a modifiable riskj factor for your health including cholesterol and blood pressure  Healthy lifestyle includes : At least 150 minutes of exercise weeks  , weight at healthy levels, which is usually   BMI 19-25. Avoid trans fats and processed foods;  Increase fresh fruits and veges to 5 servings per day. And avoid sweet beverages including tea and juice. Mediterranean diet with olive oil and nuts have been noted to be heart and brain healthy . Avoid tobacco products . Limit  alcohol to  7 per week for women and 14 servings for men.  Get adequate sleep . Wear seat belts . Don't text and drive .       Why follow it? Research shows. . Those who follow the Mediterranean diet have a reduced risk of heart disease  . The diet is associated with a reduced incidence of Parkinson's and Alzheimer's diseases . People following the diet may have longer life expectancies and lower rates of chronic diseases  . The Dietary Guidelines for Americans recommends the Mediterranean diet as an eating plan to promote health and prevent disease  What Is the Mediterranean Diet?  . Healthy eating plan based on typical foods and recipes of Mediterranean-style  cooking . The diet is primarily a plant based diet; these foods should make up a majority of meals   Starches - Plant based foods should make up a majority of meals - They are an important sources of vitamins, minerals, energy, antioxidants, and fiber - Choose whole grains, foods high in fiber and minimally processed items  - Typical grain sources include wheat, oats, barley, corn, brown rice, bulgar, farro, millet, polenta, couscous  - Various types of beans include chickpeas, lentils, fava beans, black beans, white beans   Fruits  Veggies - Large quantities of antioxidant rich fruits & veggies; 6 or more servings  - Vegetables can be eaten raw or lightly drizzled with oil and cooked  - Vegetables common to the traditional Mediterranean Diet include: artichokes, arugula, beets, broccoli, brussel sprouts, cabbage, carrots, celery, collard greens, cucumbers, eggplant, kale, leeks, lemons, lettuce, mushrooms, okra, onions, peas, peppers, potatoes, pumpkin, radishes, rutabaga, shallots, spinach, sweet potatoes, turnips, zucchini - Fruits common to the Mediterranean Diet include: apples, apricots, avocados, cherries, clementines, dates, figs, grapefruits, grapes, melons, nectarines, oranges, peaches, pears, pomegranates, strawberries, tangerines  Fats - Replace butter and margarine with healthy oils, such as olive oil, canola oil, and tahini  - Limit nuts to no more than a handful a day  - Nuts include walnuts, almonds, pecans, pistachios, pine nuts  - Limit or avoid candied, honey roasted or heavily salted nuts - Olives are central to the [employer] - can be eaten whole or used in a variety of dishes   Meats Protein - Limiting red meat: no more than a few times a month - When eating red meat: choose lean cuts and keep the portion to the size of deck of cards - Eggs: approx. 0 to 4 times a week  - Fish and lean poultry: at least 2 a week  - Healthy protein sources include, chicken, [name],  lean beef, lamb - Increase intake of seafood such as tuna, salmon, trout, mackerel, shrimp, scallops - Avoid or limit high fat processed meats such as sausage and bacon  Dairy - Include moderate amounts of  low fat dairy products  - Focus on healthy dairy such as fat free yogurt, skim milk, low or reduced fat cheese - Limit dairy products higher in fat such as whole or 2% milk, cheese, ice cream  Alcohol - Moderate amounts of red wine is ok  - No more than 5 oz daily for women (all ages) and men older than age 49  - No more than 10 oz of wine daily for men younger than 55  Other - Limit sweets and other desserts  - Use herbs and spices instead of salt to flavor foods  - Herbs and spices common to the traditional Mediterranean Diet include: basil, bay leaves, chives, cloves, cumin, fennel, garlic, lavender, marjoram, mint, oregano, parsley, pepper, rosemary, sage, savory, sumac, tarragon, thyme   It's not just a diet, it's a lifestyle:  . The Mediterranean diet includes lifestyle factors typical of those in the region  . Foods, drinks and meals are best eaten with others and savored . Daily physical activity is important for overall good health . This could be strenuous exercise like running and aerobics . This could also be more leisurely activities such as walking, housework, yard-work, or taking the stairs . Moderation is the key; a balanced and healthy diet accommodates most foods and drinks . Consider portion sizes and frequency of consumption of certain foods   Meal Ideas & Options:  . Breakfast:  o Whole wheat toast or whole wheat English muffins with peanut butter & hard boiled egg o Steel cut oats topped with apples & cinnamon and skim milk  o Fresh fruit: banana, strawberries, melon, berries, peaches  o Smoothies: strawberries, bananas, greek yogurt, peanut butter o Low fat greek yogurt with blueberries and granola  o Egg white omelet with spinach and mushrooms o Breakfast  couscous: whole wheat couscous, apricots, skim milk, cranberries  . Sandwiches:  o Hummus and grilled vegetables (peppers, zucchini, squash) on whole wheat bread   o Grilled chicken on whole wheat pita with lettuce, tomatoes, cucumbers or tzatziki  o Tuna salad on whole wheat bread: tuna salad made with greek yogurt, olives, red peppers, capers, green onions o Garlic rosemary lamb pita: lamb sauted with garlic, rosemary, salt & pepper; add lettuce, cucumber, greek yogurt to pita - flavor with lemon juice and black pepper  . Seafood:  o Mediterranean grilled salmon, seasoned with garlic, basil, parsley, lemon juice and black pepper o Shrimp, lemon, and spinach whole-grain pasta salad made with low fat greek yogurt  o Seared scallops with lemon orzo  o Seared tuna steaks seasoned salt, pepper, coriander topped with tomato mixture of olives, tomatoes, olive oil, minced garlic, parsley, green onions and cappers  . Meats:  o Herbed greek chicken salad with kalamata olives, cucumber, feta  o Red bell peppers stuffed with spinach, bulgur, lean ground beef (or lentils) & topped with feta   o Kebabs: skewers of chicken, tomatoes, onions, zucchini, squash  o [name] burgers: made with red onions, mint, dill, lemon juice, feta cheese topped with roasted red peppers . Vegetarian o Cucumber salad: cucumbers, artichoke hearts, celery, red onion, feta cheese, tossed in olive oil & lemon juice  o Hummus and whole grain pita points with a greek salad (lettuce, tomato, feta, olives, cucumbers, red onion) o Lentil soup with celery, carrots made with vegetable broth, garlic, salt and pepper  o Tabouli salad: parsley, bulgur, mint, scallions, cucumbers, tomato, radishes, lemon juice, olive oil, salt and pepper.   Wt Readings from Last  3 Encounters:  04/03/14 293 lb (132.904 kg)  02/21/14 302 lb (136.986 kg)  01/18/14 298 lb (135.172 kg)            Mariann Laster K. Panosh M.D.

## 2014-04-08 DIAGNOSIS — E786 Lipoprotein deficiency: Secondary | ICD-10-CM | POA: Insufficient documentation

## 2014-05-16 ENCOUNTER — Encounter: Payer: Self-pay | Admitting: Internal Medicine

## 2014-05-16 ENCOUNTER — Ambulatory Visit (INDEPENDENT_AMBULATORY_CARE_PROVIDER_SITE_OTHER): Payer: 59 | Admitting: Internal Medicine

## 2014-05-16 VITALS — BP 116/70 | Temp 98.0°F | Ht 68.0 in | Wt 298.0 lb

## 2014-05-16 DIAGNOSIS — M25562 Pain in left knee: Secondary | ICD-10-CM

## 2014-05-16 NOTE — Patient Instructions (Addendum)
Suggest x ray of the fibular head area    Knee  Will arrange    Sports medicine .  Evaluation.  To be able to exercise .  You will be contacted about this appt.  Ice after activity in the interim .     Knee Pain The knee is the complex joint between your thigh and your lower leg. It is made up of bones, tendons, ligaments, and cartilage. The bones that make up the knee are:  The femur in the thigh.  The tibia and fibula in the lower leg.  The patella or kneecap riding in the groove on the lower femur. CAUSES  Knee pain is a common complaint with many causes. A few of these causes are:  Injury, such as:  A ruptured ligament or tendon injury.  Torn cartilage.  Medical conditions, such as:  Gout  Arthritis  Infections  Overuse, over training, or overdoing a physical activity. Knee pain can be minor or severe. Knee pain can accompany debilitating injury. Minor knee problems often respond well to self-care measures or get well on their own. More serious injuries may need medical intervention or even surgery. SYMPTOMS The knee is complex. Symptoms of knee problems can vary widely. Some of the problems are:  Pain with movement and weight bearing.  Swelling and tenderness.  Buckling of the knee.  Inability to straighten or extend your knee.  Your knee locks and you cannot straighten it.  Warmth and redness with pain and fever.  Deformity or dislocation of the kneecap. DIAGNOSIS  Determining what is wrong may be very straight forward such as when there is an injury. It can also be challenging because of the complexity of the knee. Tests to make a diagnosis may include:  Your caregiver taking a history and doing a physical exam.  Routine X-rays can be used to rule out other problems. X-rays will not reveal a cartilage tear. Some injuries of the knee can be diagnosed by:  Arthroscopy a surgical technique by which a small video camera is inserted through tiny incisions on  the sides of the knee. This procedure is used to examine and repair internal knee joint problems. Tiny instruments can be used during arthroscopy to repair the torn knee cartilage (meniscus).  Arthrography is a radiology technique. A contrast liquid is directly injected into the knee joint. Internal structures of the knee joint then become visible on X-ray film.  An MRI scan is a non X-ray radiology procedure in which magnetic fields and a computer produce two- or three-dimensional images of the inside of the knee. Cartilage tears are often visible using an MRI scanner. MRI scans have largely replaced arthrography in diagnosing cartilage tears of the knee.  Blood work.  Examination of the fluid that helps to lubricate the knee joint (synovial fluid). This is done by taking a sample out using a needle and a syringe. TREATMENT The treatment of knee problems depends on the cause. Some of these treatments are:  Depending on the injury, proper casting, splinting, surgery, or physical therapy care will be needed.  Give yourself adequate recovery time. Do not overuse your joints. If you begin to get sore during workout routines, back off. Slow down or do fewer repetitions.  For repetitive activities such as cycling or running, maintain your strength and nutrition.  Alternate muscle groups. For example, if you are a weight lifter, work the upper body on one day and the lower body the next.  Either tight or  weak muscles do not give the proper support for your knee. Tight or weak muscles do not absorb the stress placed on the knee joint. Keep the muscles surrounding the knee strong.  Take care of mechanical problems.  If you have flat feet, orthotics or special shoes may help. See your caregiver if you need help.  Arch supports, sometimes with wedges on the inner or outer aspect of the heel, can help. These can shift pressure away from the side of the knee most bothered by osteoarthritis.  A brace  called an "unloader" brace also may be used to help ease the pressure on the most arthritic side of the knee.  If your caregiver has prescribed crutches, braces, wraps or ice, use as directed. The acronym for this is PRICE. This means protection, rest, ice, compression, and elevation.  Nonsteroidal anti-inflammatory drugs (NSAIDs), can help relieve pain. But if taken immediately after an injury, they may actually increase swelling. Take NSAIDs with food in your stomach. Stop them if you develop stomach problems. Do not take these if you have a history of ulcers, stomach pain, or bleeding from the bowel. Do not take without your caregiver's approval if you have problems with fluid retention, heart failure, or kidney problems.  For ongoing knee problems, physical therapy may be helpful.  Glucosamine and chondroitin are over-the-counter dietary supplements. Both may help relieve the pain of osteoarthritis in the knee. These medicines are different from the usual anti-inflammatory drugs. Glucosamine may decrease the rate of cartilage destruction.  Injections of a corticosteroid drug into your knee joint may help reduce the symptoms of an arthritis flare-up. They may provide pain relief that lasts a few months. You may have to wait a few months between injections. The injections do have a small increased risk of infection, water retention, and elevated blood sugar levels.  Hyaluronic acid injected into damaged joints may ease pain and provide lubrication. These injections may work by reducing inflammation. A series of shots may give relief for as long as 6 months.  Topical painkillers. Applying certain ointments to your skin may help relieve the pain and stiffness of osteoarthritis. Ask your pharmacist for suggestions. Many over the-counter products are approved for temporary relief of arthritis pain.  In some countries, doctors often prescribe topical NSAIDs for relief of chronic conditions such as  arthritis and tendinitis. A review of treatment with NSAID creams found that they worked as well as oral medications but without the serious side effects. PREVENTION  Maintain a healthy weight. Extra pounds put more strain on your joints.  Get strong, stay limber. Weak muscles are a common cause of knee injuries. Stretching is important. Include flexibility exercises in your workouts.  Be smart about exercise. If you have osteoarthritis, chronic knee pain or recurring injuries, you may need to change the way you exercise. This does not mean you have to stop being active. If your knees ache after jogging or playing basketball, consider switching to swimming, water aerobics, or other low-impact activities, at least for a few days a week. Sometimes limiting high-impact activities will provide relief.  Make sure your shoes fit well. Choose footwear that is right for your sport.  Protect your knees. Use the proper gear for knee-sensitive activities. Use kneepads when playing volleyball or laying carpet. Buckle your seat belt every time you drive. Most shattered kneecaps occur in car accidents.  Rest when you are tired. SEEK MEDICAL CARE IF:  You have knee pain that is continual and does  not seem to be getting better.  SEEK IMMEDIATE MEDICAL CARE IF:  Your knee joint feels hot to the touch and you have a high fever. MAKE SURE YOU:   Understand these instructions.  Will watch your condition.  Will get help right away if you are not doing well or get worse. Document Released: 01/12/2007 Document Revised: 06/09/2011 Document Reviewed: 01/12/2007 [facility] Patient Information 2015 [place], [place]. This information is not intended to replace advice given to you by your health care provider. Make sure you discuss any questions you have with your health care provider.

## 2014-05-16 NOTE — Progress Notes (Signed)
Pre visit review using our clinic review tool, if applicable. No additional management support is needed unless otherwise documented below in the visit note.  Chief Complaint  Patient presents with  . Left Knee Pain    X2Months.  Pain worsening.    HPI: Patient Amy Stokes  comes in today for SDA for  new problem evaluation. Pain behind back of left knees.   Worse over weeks   Not like a joint pain .  Lateral inferior kness area very locaized and feels puffy behind knees  No leg pain  Swelling otherwise  No prev injury .  Nocturnal pain if ;lays on left side  Trying to exercise  : treadmill not worse .    And leg and machine  Not during this area.  Toothache kind of pain.  Not severe enough for medication.   Was concerned  Could be serious.  ROS: See pertinent positives and negatives per HPI.  Past Medical History  Diagnosis Date  . Hypertension   . Obesity   . Fibroids   . Edema   . Asthma   . Hyperlipidemia   . Thyroid nodule 01/2009    left discovered on chest ct; needle bx "indeterminant" cannon r/o follicular neoplasm     Family History  Problem Relation Age of Onset  . Arthritis Mother   . Other Mother     neurological disorder  . Pancreatic cancer Mother   . Heart disease Father   . Thyroid nodules Sister   . Heart disease Brother     x2 1 brother deceased    History   Social History  . Marital Status: Married    Spouse Name: N/A  . Number of Children: 0  . Years of Education: N/A   Occupational History  . retired    Social History Main Topics  . Smoking status: Former Smoker -- 1.00 packs/day for 1 years    Types: Cigarettes    Quit date: 04/29/1971  . Smokeless tobacco: Never Used  . Alcohol Use: Yes     Comment: occ  . Drug Use: No  . Sexual Activity: No   Other Topics Concern  . None   Social History Narrative   Married   Occupation: Chiropractor currently unemployed   Married   Regular exercise-no   Hazard of 2   Pet cat         Outpatient Encounter Prescriptions as of 05/16/2014  Medication Sig  . aspirin 81 MG tablet Take 81 mg by mouth daily.  . Fluticasone-Salmeterol (ADVAIR DISKUS) 100-50 MCG/DOSE AEPB Inhale 1 puff into the lungs 2 (two) times daily. Or as directed  . loratadine (CLARITIN) 10 MG tablet Take 10 mg by mouth daily.  [name] losartan (COZAAR) 100 MG tablet Take 100 mg by mouth daily. For hypertension  . MULTIPLE VITAMIN PO Take 1 tablet by mouth 2 (two) times daily. Vitafusion.  . Probiotic Product (PROBIOTIC DAILY PO) Take 1 tablet by mouth 2 (two) times daily. Insync  . albuterol (PROAIR HFA) 108 (90 BASE) MCG/ACT inhaler Inhale 2 puffs into the lungs every 6 (six) hours as needed for wheezing. (Patient not taking: Reported on 05/16/2014)    EXAM:  BP 116/70 mmHg  Temp(Src) 98 F (36.7 C) (Oral)  Ht 5\' 8"  (1.727 m)  Wt 298 lb (135.172 kg)  BMI 45.32 kg/m2  Body mass index is 45.32 kg/(m^2).  GENERAL: vitals reviewed and listed above, alert, oriented, appears well hydrated and in no acute  distress HEENT: atraumatic, conjunctiva  clear, no obvious abnormalities on inspection of external nose and ears MS: moves all extremities without noticeable focal  Abnormality left knee good rom somewhat puffy posterior  No deformity localized area of pain around fibular head and  No defomity . Gait mildly antalgic  PSYCH: pleasant and cooperative, no obvious depression or anxiety  ASSESSMENT AND PLAN:  Discussed the following assessment and plan:  Left knee pain - seems mechanical and not vascular . ice after activiy see sm continue exercise as tolerated  Bakers cyst alos possible      Expectant management. And referral   -Patient advised to return or notify health care team  if symptoms worsen ,persist or new concerns arise.  Patient Instructions  Suggest x ray of the fibular head area    Knee  Will arrange    Sports medicine .  Evaluation.  To be able to exercise .  You will be contacted about this  appt.  Ice after activity in the interim .     Knee Pain The knee is the complex joint between your thigh and your lower leg. It is made up of bones, tendons, ligaments, and cartilage. The bones that make up the knee are:  The femur in the thigh.  The tibia and fibula in the lower leg.  The patella or kneecap riding in the groove on the lower femur. CAUSES  Knee pain is a common complaint with many causes. A few of these causes are:  Injury, such as:  A ruptured ligament or tendon injury.  Torn cartilage.  Medical conditions, such as:  Gout  Arthritis  Infections  Overuse, over training, or overdoing a physical activity. Knee pain can be minor or severe. Knee pain can accompany debilitating injury. Minor knee problems often respond well to self-care measures or get well on their own. More serious injuries may need medical intervention or even surgery. SYMPTOMS The knee is complex. Symptoms of knee problems can vary widely. Some of the problems are:  Pain with movement and weight bearing.  Swelling and tenderness.  Buckling of the knee.  Inability to straighten or extend your knee.  Your knee locks and you cannot straighten it.  Warmth and redness with pain and fever.  Deformity or dislocation of the kneecap. DIAGNOSIS  Determining what is wrong may be very straight forward such as when there is an injury. It can also be challenging because of the complexity of the knee. Tests to make a diagnosis may include:  Your caregiver taking a history and doing a physical exam.  Routine X-rays can be used to rule out other problems. X-rays will not reveal a cartilage tear. Some injuries of the knee can be diagnosed by:  Arthroscopy a surgical technique by which a small video camera is inserted through tiny incisions on the sides of the knee. This procedure is used to examine and repair internal knee joint problems. Tiny instruments can be used during arthroscopy to repair  the torn knee cartilage (meniscus).  Arthrography is a radiology technique. A contrast liquid is directly injected into the knee joint. Internal structures of the knee joint then become visible on X-ray film.  An MRI scan is a non X-ray radiology procedure in which magnetic fields and a computer produce two- or three-dimensional images of the inside of the knee. Cartilage tears are often visible using an MRI scanner. MRI scans have largely replaced arthrography in diagnosing cartilage tears of the knee.  Blood work.  Examination of the fluid that helps to lubricate the knee joint (synovial fluid). This is done by taking a sample out using a needle and a syringe. TREATMENT The treatment of knee problems depends on the cause. Some of these treatments are:  Depending on the injury, proper casting, splinting, surgery, or physical therapy care will be needed.  Give yourself adequate recovery time. Do not overuse your joints. If you begin to get sore during workout routines, back off. Slow down or do fewer repetitions.  For repetitive activities such as cycling or running, maintain your strength and nutrition.  Alternate muscle groups. For example, if you are a weight lifter, work the upper body on one day and the lower body the next.  Either tight or weak muscles do not give the proper support for your knee. Tight or weak muscles do not absorb the stress placed on the knee joint. Keep the muscles surrounding the knee strong.  Take care of mechanical problems.  If you have flat feet, orthotics or special shoes may help. See your caregiver if you need help.  Arch supports, sometimes with wedges on the inner or outer aspect of the heel, can help. These can shift pressure away from the side of the knee most bothered by osteoarthritis.  A brace called an "unloader" brace also may be used to help ease the pressure on the most arthritic side of the knee.  If your caregiver has prescribed crutches,  braces, wraps or ice, use as directed. The acronym for this is PRICE. This means protection, rest, ice, compression, and elevation.  Nonsteroidal anti-inflammatory drugs (NSAIDs), can help relieve pain. But if taken immediately after an injury, they may actually increase swelling. Take NSAIDs with food in your stomach. Stop them if you develop stomach problems. Do not take these if you have a history of ulcers, stomach pain, or bleeding from the bowel. Do not take without your caregiver's approval if you have problems with fluid retention, heart failure, or kidney problems.  For ongoing knee problems, physical therapy may be helpful.  Glucosamine and chondroitin are over-the-counter dietary supplements. Both may help relieve the pain of osteoarthritis in the knee. These medicines are different from the usual anti-inflammatory drugs. Glucosamine may decrease the rate of cartilage destruction.  Injections of a corticosteroid drug into your knee joint may help reduce the symptoms of an arthritis flare-up. They may provide pain relief that lasts a few months. You may have to wait a few months between injections. The injections do have a small increased risk of infection, water retention, and elevated blood sugar levels.  Hyaluronic acid injected into damaged joints may ease pain and provide lubrication. These injections may work by reducing inflammation. A series of shots may give relief for as long as 6 months.  Topical painkillers. Applying certain ointments to your skin may help relieve the pain and stiffness of osteoarthritis. Ask your pharmacist for suggestions. Many over the-counter products are approved for temporary relief of arthritis pain.  In some countries, doctors often prescribe topical NSAIDs for relief of chronic conditions such as arthritis and tendinitis. A review of treatment with NSAID creams found that they worked as well as oral medications but without the serious side  effects. PREVENTION  Maintain a healthy weight. Extra pounds put more strain on your joints.  Get strong, stay limber. Weak muscles are a common cause of knee injuries. Stretching is important. Include flexibility exercises in your workouts.  Be smart about exercise. If you have  osteoarthritis, chronic knee pain or recurring injuries, you may need to change the way you exercise. This does not mean you have to stop being active. If your knees ache after jogging or playing basketball, consider switching to swimming, water aerobics, or other low-impact activities, at least for a few days a week. Sometimes limiting high-impact activities will provide relief.  Make sure your shoes fit well. Choose footwear that is right for your sport.  Protect your knees. Use the proper gear for knee-sensitive activities. Use kneepads when playing volleyball or laying carpet. Buckle your seat belt every time you drive. Most shattered kneecaps occur in car accidents.  Rest when you are tired. SEEK MEDICAL CARE IF:  You have knee pain that is continual and does not seem to be getting better.  SEEK IMMEDIATE MEDICAL CARE IF:  Your knee joint feels hot to the touch and you have a high fever. MAKE SURE YOU:   Understand these instructions.  Will watch your condition.  Will get help right away if you are not doing well or get worse. Document Released: 01/12/2007 Document Revised: 06/09/2011 Document Reviewed: 01/12/2007 [facility] Patient Information 2015 [place], [place]. This information is not intended to replace advice given to you by your health care provider. Make sure you discuss any questions you have with your health care provider.      [name]. Panosh M.D.

## 2014-05-26 ENCOUNTER — Other Ambulatory Visit (INDEPENDENT_AMBULATORY_CARE_PROVIDER_SITE_OTHER): Payer: 59

## 2014-05-26 ENCOUNTER — Encounter: Payer: Self-pay | Admitting: Family Medicine

## 2014-05-26 ENCOUNTER — Ambulatory Visit (INDEPENDENT_AMBULATORY_CARE_PROVIDER_SITE_OTHER): Payer: 59 | Admitting: Family Medicine

## 2014-05-26 VITALS — BP 142/78 | HR 92 | Ht 68.0 in | Wt 299.0 lb

## 2014-05-26 DIAGNOSIS — M25562 Pain in left knee: Secondary | ICD-10-CM

## 2014-05-26 DIAGNOSIS — M7632 Iliotibial band syndrome, left leg: Secondary | ICD-10-CM | POA: Insufficient documentation

## 2014-05-26 DIAGNOSIS — IMO0002 Reserved for concepts with insufficient information to code with codable children: Secondary | ICD-10-CM | POA: Insufficient documentation

## 2014-05-26 DIAGNOSIS — M129 Arthropathy, unspecified: Secondary | ICD-10-CM

## 2014-05-26 NOTE — Progress Notes (Signed)
[facility] [address], [place] [address] Phone: [phone] Subjective:    I'm seeing this patient by the request  of:  Lottie Dawson, MD   CC: Left knee pain  [id number] Amy Stokes is a 64 y.o. female coming in with complaint of  left knee pain. Patient states that this is been more of an insidious onset over multiple months. Patient does not remember any true injury. Patient has had more of a dull throbbing aching pain. Patient has only tried some over-the-counter anti-inflammatories with no significant improvement. Patient states it seems to hurt worse after sitting a long amount of time and then trying to angulate. Patient states that she works out on a regular basis seems to be more beneficial. Patient denies any radiation of pain, denies any clicking, popping, or giving out on her. Patient has found it somewhat difficult to do some activities of daily living due to the pain. Patient denies that it is waking her up at night. Rates the severity of pain as 5 out of 10.     Past medical history, social, surgical and family history all reviewed in electronic medical record.   Review of Systems: No headache, visual changes, nausea, vomiting, diarrhea, constipation, dizziness, abdominal pain, skin rash, fevers, chills, night sweats, weight loss, swollen lymph nodes, body aches, joint swelling, muscle aches, chest pain, shortness of breath, mood changes.   Objective Blood pressure 142/78, pulse 92, height 5\' 8"  (1.727 m), weight 299 lb (135.626 kg), SpO2 90 %.  General: No apparent distress alert and oriented x3 mood and affect normal, dressed appropriately. obese HEENT: Pupils equal, extraocular movements intact  Respiratory: Patient's speak in full sentences and does not appear short of breath  Cardiovascular: No lower extremity edema, non tender, no erythema  Skin: Warm dry intact with no signs of infection or rash on extremities or  on axial skeleton.  Abdomen: Soft nontender  Neuro: Cranial nerves II through XII are intact, neurovascularly intact in all extremities with 2+ DTRs and 2+ pulses.  Lymph: No lymphadenopathy of posterior or anterior cervical chain or axillae bilaterally.  Gait normal with good balance and coordination.  MSK:  Non tender with full range of motion and good stability and symmetric strength and tone of shoulders, elbows, wrist, hip, and ankles bilaterally.  Knee: Left Mild valgus deformity noted Moderate tenderness to palpation over the medial and lateral joint lines. Patient is severely tender though over the distal iliotibial band.[name] ROM full in flexion and extension and lower leg rotation. Ligaments with solid consistent endpoints including ACL, PCL, LCL, MCL. Negative Mcmurray's, Apley's, and Thessalonian tests. Non painful patellar compression. Patellar glide with moderate crepitus. Patellar and quadriceps tendons unremarkable. Hamstring and quadriceps strength is normal.   MSK US performed of: Left knee This study was ordered, performed, and interpreted by Charlann Boxer D.O.  Knee: All structures visualized. Moderate osteoarthritic changes but no significant degenerative changes of the meniscus bilaterally. Patellar Tendon unremarkable on long and transverse views without effusion. No abnormality of prepatellar bursa. LCL and MCL unremarkable on long and transverse views. She does have increasing Doppler flow of the distal iliotibial band. No abnormality of origin of medial or lateral head of the gastrocnemius.  IMPRESSION: Moderate osteophytic changes but no acute intra-articular pathology   Procedure note 97110; 15 minutes spent for Therapeutic exercises as stated in above notes.  This included exercises focusing on stretching, strengthening, with significant focus on eccentric aspects.  Proper technique shown and discussed handout in great detail with ATC.  All questions were  discussed and answered.     Impression and Recommendations:     This case required medical decision making of moderate complexity.

## 2014-05-26 NOTE — Assessment & Plan Note (Signed)
Patient is having more of an iliotibial band syndrome this is secondary to the hip weakness as well as some underlying osteophytic changes of the knee. Patient did work with Product/process development scientist today that I think will be beneficial. We discussed icing regimen and home exercises. We discussed topical anti-inflammatories as well as compression sleeve. Patient is going to try to make these changes and some over-the-counter additional changes. We discussed the importance of weight loss as well. Patient will come back in 3 weeks. Patient continues to have difficulty then we will do further workup for the underlying osteoarthritis.

## 2014-05-26 NOTE — Patient Instructions (Signed)
Good to see you Ice 20 minutes 2 times daily. Usually after activity and before bed. Exercises 3 times a week.  Vitamin D 2000 IU daily Turmeric 500mg  twice daily Pennsaid twice daily Wear good shoes  Good shoes with rigid bottom.  Jalene Mullet, Merrell or New balance greater then 700 Compression sleeve to knee  [email or web address]  Size xL if you want See me again in 3 weeks.

## 2014-05-26 NOTE — Progress Notes (Signed)
Pre visit review using our clinic review tool, if applicable. No additional management support is needed unless otherwise documented below in the visit note. 

## 2014-06-22 ENCOUNTER — Ambulatory Visit (INDEPENDENT_AMBULATORY_CARE_PROVIDER_SITE_OTHER): Payer: 59 | Admitting: Family Medicine

## 2014-06-22 ENCOUNTER — Encounter: Payer: Self-pay | Admitting: Family Medicine

## 2014-06-22 VITALS — BP 140/80 | HR 97 | Wt 295.0 lb

## 2014-06-22 DIAGNOSIS — M129 Arthropathy, unspecified: Secondary | ICD-10-CM | POA: Diagnosis not present

## 2014-06-22 DIAGNOSIS — IMO0002 Reserved for concepts with insufficient information to code with codable children: Secondary | ICD-10-CM

## 2014-06-22 DIAGNOSIS — M7632 Iliotibial band syndrome, left leg: Secondary | ICD-10-CM

## 2014-06-22 MED ORDER — DICLOFENAC SODIUM 2 % TD SOLN: TRANSDERMAL | Status: DC

## 2014-06-22 NOTE — Assessment & Plan Note (Signed)
Patient is improving slowly. Encourage patient to do the exercises on a more regular basis and we discussed strengthening of the hip abductors. We discussed proper shoe choices. Patient also discussed the possibility of weight loss. Patient is trying to be more active and will try to make it more of a habitual thing to do the exercises. Patient and will come back in 6 weeks for further evaluation and treatment.

## 2014-06-22 NOTE — Progress Notes (Signed)
  Amy Stokes, [place] [address] Phone: [phone] Subjective:      CC: Left knee pain follow-up  [id number] INIOLUWA Stokes is a 64 y.o. female coming Amy with complaint of  left knee pain. Patient was found to have moderate to severe osteophytic changes of the knee as well as the distal iliotibial band. Patient states that she is approximately 60% better. Patient is taking vitamins but not doing the exercises on a regular basis. Patient is also not icing. Patient still states though that her daily activities seem to be getting easier any fever. Denies any nighttime awakening and denies any new symptoms.     Past medical history, social, surgical and family history all reviewed Amy electronic medical record.   Review of Systems: No headache, visual changes, nausea, vomiting, diarrhea, constipation, dizziness, abdominal pain, skin rash, fevers, chills, night sweats, weight loss, swollen lymph nodes, body aches, joint swelling, muscle aches, chest pain, shortness of breath, mood changes.   Objective Pulse 97, weight 295 lb (133.811 kg), SpO2 95 %.  General: No apparent distress alert and oriented x3 mood and affect normal, dressed appropriately. obese HEENT: Pupils equal, extraocular movements intact  Respiratory: Patient's speak Amy full sentences and does not appear short of breath  Cardiovascular: No lower extremity edema, non tender, no erythema  Skin: Warm dry intact with no signs of infection or rash on extremities or on axial skeleton.  Abdomen: Soft nontender  Neuro: Cranial nerves II through XII are intact, neurovascularly intact Amy all extremities with 2+ DTRs and 2+ pulses.  Lymph: No lymphadenopathy of posterior or anterior cervical chain or axillae bilaterally.  Gait normal with good balance and coordination.  MSK:  Non tender with full range of motion and good stability and symmetric strength and tone of shoulders, elbows,  wrist, hip, and ankles bilaterally.  Knee: Left Mild valgus deformity noted Mild to moderate tenderness to palpation over the medial and lateral joint lines. Mild improvement over the iliotibial band ROM full Amy flexion and extension and lower leg rotation. Ligaments with solid consistent endpoints including ACL, PCL, LCL, MCL. Negative Mcmurray's, Apley's, and Thessalonian tests. Non painful patellar compression. Patellar glide with moderate crepitus. Patellar and quadriceps tendons unremarkable. Hamstring and quadriceps strength is normal.      Impression and Recommendations:     This case required medical decision making of moderate complexity.

## 2014-06-22 NOTE — Assessment & Plan Note (Signed)
Believe the patient's underlying arthritis is likely giving her the difficulty. We discussed the possibility of injection patient declined. We discussed the possibility of custom orthotics this patient also declined. Patient will start try to do the exercises on a more regular basis. Patient will then come back and see me again in 6 weeks for further evaluation. If continuing to have any pain in injection would likely be necessary.

## 2014-06-22 NOTE — Progress Notes (Signed)
Pre visit review using our clinic review tool, if applicable. No additional management support is needed unless otherwise documented below in the visit note. 

## 2014-06-22 NOTE — Patient Instructions (Signed)
Good to see you Try to ice nightly Exercises 3 times a week would be great Continue the vitamins Try pennsaid twice daily See me again in 6 weeks if knee hurts

## 2014-07-11 ENCOUNTER — Encounter: Payer: Self-pay | Admitting: Internal Medicine

## 2014-07-11 ENCOUNTER — Ambulatory Visit (INDEPENDENT_AMBULATORY_CARE_PROVIDER_SITE_OTHER): Payer: 59 | Admitting: Internal Medicine

## 2014-07-11 VITALS — BP 116/70 | HR 82 | Temp 98.2°F | Ht 68.0 in | Wt 300.4 lb

## 2014-07-11 DIAGNOSIS — J45998 Other asthma: Secondary | ICD-10-CM

## 2014-07-11 DIAGNOSIS — G4730 Sleep apnea, unspecified: Secondary | ICD-10-CM

## 2014-07-11 DIAGNOSIS — G478 Other sleep disorders: Secondary | ICD-10-CM

## 2014-07-11 MED ORDER — PREDNISONE 20 MG PO TABS: ORAL_TABLET | ORAL | Status: DC

## 2014-07-11 NOTE — Patient Instructions (Addendum)
Treat like a mild asthma flare.  Prednisone for 5 days  Continue advair.   Albuterol.   rescue as needed  If not getting better .    Plan ROV   Air conditioning .  Can filter out the pollen.

## 2014-07-11 NOTE — Progress Notes (Signed)
Pre visit review using our clinic review tool, if applicable. No additional management support is needed unless otherwise documented below in the visit note.   Chief Complaint  Patient presents with  . Wheezing    Wheezing at night and keeping the pt awake.    HPI: Patient Amy Stokes  comes in today for SDA for  new problem evaluation. Onset sat ,3 days ago and making hard to sleep  Wheezing  At night nocturnal. Not rescue at this point.  Not too sob.  Feels like asthma wheezy Before weekend "normal"  No wheezing   On advair  1 puff bid  "For ever" No tobacco no new pets.  Cat  Non allergic.  Day not as bad as night.  Denies fever uri sx   ROS: See pertinent positives and negatives per HPI.under erx for osa  Mouth appliance adjusting  Work in progress. Hx of sob episode in past  After using albuterol went to ed  And resolved Past Medical History  Diagnosis Date  . Hypertension   . Obesity   . Fibroids   . Edema   . Asthma   . Hyperlipidemia   . Thyroid nodule 01/2009    left discovered on chest ct; needle bx "indeterminant" cannon r/o follicular neoplasm     Family History  Problem Relation Age of Onset  . Arthritis Mother   . Other Mother     neurological disorder  . Pancreatic cancer Mother   . Heart disease Father   . Thyroid nodules Sister   . Heart disease Brother     x2 1 brother deceased    History   Social History  . Marital Status: Married    Spouse Name: N/A  . Number of Children: 0  . Years of Education: N/A   Occupational History  . retired    Social History Main Topics  . Smoking status: Former Smoker -- 1.00 packs/day for 1 years    Types: Cigarettes    Quit date: 04/29/1971  . Smokeless tobacco: Never Used  . Alcohol Use: Yes     Comment: occ  . Drug Use: No  . Sexual Activity: No   Other Topics Concern  . None   Social History Narrative   Married   Occupation: Chiropractor currently unemployed   Married   Regular  exercise-no   [place] of 2   Pet cat          Outpatient Encounter Prescriptions as of 07/11/2014  Medication Sig  . aspirin 81 MG tablet Take 81 mg by mouth daily.  . Cholecalciferol (VITAMIN D3) 2000 UNITS TABS Take 1 tablet by mouth daily.  . Diclofenac Sodium 2 % SOLN Apply 1 pump twice daily.  . Fluticasone-Salmeterol (ADVAIR DISKUS) 100-50 MCG/DOSE AEPB Inhale 1 puff into the lungs 2 (two) times daily. Or as directed  . loratadine (CLARITIN) 10 MG tablet Take 10 mg by mouth daily.  [name] losartan (COZAAR) 100 MG tablet Take 100 mg by mouth daily. For hypertension  . MULTIPLE VITAMIN PO Take 1 tablet by mouth 2 (two) times daily. Vitafusion.  [name] OVER THE COUNTER MEDICATION Tumeric 500mg  daily.  . Probiotic Product (PROBIOTIC DAILY PO) Take 1 tablet by mouth 2 (two) times daily. Insync  . [DISCONTINUED] Cholecalciferol (VITAMIN D-3) 1000 UNITS CAPS Take 2 capsules by mouth daily.  [name] albuterol (PROAIR HFA) 108 (90 BASE) MCG/ACT inhaler Inhale 2 puffs into the lungs every 6 (six) hours as needed for wheezing. (Patient  not taking: Reported on 07/11/2014)  . predniSONE (DELTASONE) 20 MG tablet Take 3 po qd for 2 days then 2 po qd for 3 days,or as directed    EXAM:  BP 116/70 mmHg  Pulse 82  Temp(Src) 98.2 F (36.8 C) (Oral)  Ht 5\' 8"  (1.727 m)  Wt 300 lb 6.4 oz (136.261 kg)  BMI 45.69 kg/m2  SpO2 97%  Body mass index is 45.69 kg/(m^2).  GENERAL: vitals reviewed and listed above, alert, oriented, appears well hydrated and in no acute distress no toxic  Not breathless HEENT: atraumatic, conjunctiva  clear, no obvious abnormalities on inspection of external nose and ears OP : no lesion edema or exudate  NECK: no obvious masses on inspection palpation  LUNGS: clear to auscultation bilaterally, no rales or rhonchi,  Wheezy cough  Dec bs ? Minimally tight  CV: HRRR, no clubbing cyanosis or  peripheral edema nl cap refill  MS: moves all extremities without noticeable focal  abnormality PSYCH:  pleasant and cooperative,  ASSESSMENT AND PLAN:  Discussed the following assessment and plan:  Nocturnal asthma - recenet new sx    Sleep-disordered breathing - under  care work in progress for airway use  of appliance   Morbid obesity  on advair can use albuterol if needed  Uncertain if side effect of import  By hx   Fu if  persistent or progressive if needed can change her controller med and or get dr Gwenette Greet involved  -Patient advised to return or notify health care team  if symptoms worsen ,persist or new concerns arise.  Patient Instructions  Treat like a mild asthma flare.  Prednisone for 5 days  Continue advair.   Albuterol.   rescue as needed  If not getting better .    Plan ROV   Air conditioning .  Can filter out the pollen.     [name]. Panosh M.D.   Wt Readings from Last 3 Encounters:  07/11/14 300 lb 6.4 oz (136.261 kg)  06/22/14 295 lb (133.811 kg)  05/26/14 299 lb (135.626 kg)

## 2014-07-14 ENCOUNTER — Other Ambulatory Visit (INDEPENDENT_AMBULATORY_CARE_PROVIDER_SITE_OTHER): Payer: 59

## 2014-07-14 DIAGNOSIS — Z0000 Encounter for general adult medical examination without abnormal findings: Secondary | ICD-10-CM

## 2014-07-14 LAB — CBC WITH DIFFERENTIAL/PLATELET
Basophils Absolute: 0 10*3/uL (ref 0.0–0.1)
Basophils Relative: 0.4 % (ref 0.0–3.0)
Eosinophils Absolute: 0 10*3/uL (ref 0.0–0.7)
Eosinophils Relative: 0.3 % (ref 0.0–5.0)
HCT: 39.5 % (ref 36.0–46.0)
Hemoglobin: 13.1 g/dL (ref 12.0–15.0)
Lymphocytes Relative: 14.6 % (ref 12.0–46.0)
Lymphs Abs: 1.2 10*3/uL (ref 0.7–4.0)
MCHC: 33.1 g/dL (ref 30.0–36.0)
MCV: 80.7 fl (ref 78.0–100.0)
Monocytes Absolute: 0.3 10*3/uL (ref 0.1–1.0)
Monocytes Relative: 4 % (ref 3.0–12.0)
Neutro Abs: 6.7 10*3/uL (ref 1.4–7.7)
Neutrophils Relative %: 80.7 % — ABNORMAL HIGH (ref 43.0–77.0)
Platelets: 249 10*3/uL (ref 150.0–400.0)
RBC: 4.89 Mil/uL (ref 3.87–5.11)
RDW: 15.7 % — ABNORMAL HIGH (ref 11.5–15.5)
WBC: 8.3 10*3/uL (ref 4.0–10.5)

## 2014-07-14 LAB — TSH: TSH: 2.94 u[IU]/mL (ref 0.35–4.50)

## 2014-07-14 LAB — LIPID PANEL
Cholesterol: 177 mg/dL (ref 0–200)
HDL: 44.4 mg/dL (ref >39.00)
LDL Cholesterol: 118 mg/dL — ABNORMAL HIGH (ref 0–99)
NonHDL: 132.6
Total CHOL/HDL Ratio: 4
Triglycerides: 72 mg/dL (ref 0.0–149.0)
VLDL: 14.4 mg/dL (ref 0.0–40.0)

## 2014-07-14 LAB — HEPATIC FUNCTION PANEL
ALT: 19 U/L (ref 0–35)
AST: 14 U/L (ref 0–37)
Albumin: 3.6 g/dL (ref 3.5–5.2)
Alkaline Phosphatase: 87 U/L (ref 39–117)
Bilirubin, Direct: 0 mg/dL (ref 0.0–0.3)
Total Bilirubin: 0.4 mg/dL (ref 0.2–1.2)
Total Protein: 6.9 g/dL (ref 6.0–8.3)

## 2014-07-14 LAB — BASIC METABOLIC PANEL
BUN: 15 mg/dL (ref 6–23)
CO2: 33 mEq/L — ABNORMAL HIGH (ref 19–32)
Calcium: 9.1 mg/dL (ref 8.4–10.5)
Chloride: 99 mEq/L (ref 96–112)
Creatinine, Ser: 0.59 mg/dL (ref 0.40–1.20)
GFR: 109.32 mL/min (ref >60.00)
Glucose, Bld: 106 mg/dL — ABNORMAL HIGH (ref 70–99)
Potassium: 3.8 mEq/L (ref 3.5–5.1)
Sodium: 137 mEq/L (ref 135–145)

## 2014-08-03 ENCOUNTER — Other Ambulatory Visit: Payer: Self-pay | Admitting: Internal Medicine

## 2014-08-04 ENCOUNTER — Encounter: Payer: Self-pay | Admitting: Family Medicine

## 2014-08-04 ENCOUNTER — Ambulatory Visit (INDEPENDENT_AMBULATORY_CARE_PROVIDER_SITE_OTHER): Payer: 59 | Admitting: Family Medicine

## 2014-08-04 ENCOUNTER — Other Ambulatory Visit (INDEPENDENT_AMBULATORY_CARE_PROVIDER_SITE_OTHER): Payer: 59

## 2014-08-04 VITALS — BP 124/70 | HR 95 | Wt 304.0 lb

## 2014-08-04 DIAGNOSIS — IMO0002 Reserved for concepts with insufficient information to code with codable children: Secondary | ICD-10-CM

## 2014-08-04 DIAGNOSIS — M25562 Pain in left knee: Secondary | ICD-10-CM | POA: Diagnosis not present

## 2014-08-04 DIAGNOSIS — M129 Arthropathy, unspecified: Secondary | ICD-10-CM

## 2014-08-04 NOTE — Telephone Encounter (Signed)
Sent to the pharmacy by e-scribe. 

## 2014-08-04 NOTE — Progress Notes (Signed)
  [facility] [address], [place] [address] Phone: [phone] Subjective:    CC: Left knee pain follow-up  [id number] Amy Stokes is a 64 y.o. female coming in with complaint of  left knee pain. Patient does have severe osteoarthritic changes of the left knee. Patient was doing significantly better but unfortunately started having worsening symptoms. Patient is having difficult even with angulation secondary to pain. Patient states this is starting to affect her daily activities. Was given difficult when she started to lay down the sleep. Patient states though that is not waking her up to sleep. Rates the severity of pain as 5 out of 10. Denies any numbness or weakness.    Past medical history, social, surgical and family history all reviewed in electronic medical record.   Review of Systems: No headache, visual changes, nausea, vomiting, diarrhea, constipation, dizziness, abdominal pain, skin rash, fevers, chills, night sweats, weight loss, swollen lymph nodes, body aches, joint swelling, muscle aches, chest pain, shortness of breath, mood changes.   Objective Blood pressure 124/70, pulse 95, weight 304 lb (137.893 kg), SpO2 92 %.  General: No apparent distress alert and oriented x3 mood and affect normal, dressed appropriately. obese HEENT: Pupils equal, extraocular movements intact  Respiratory: Patient's speak in full sentences and does not appear short of breath  Cardiovascular: No lower extremity edema, non tender, no erythema  Skin: Warm dry intact with no signs of infection or rash on extremities or on axial skeleton.  Abdomen: Soft nontender  Neuro: Cranial nerves II through XII are intact, neurovascularly intact in all extremities with 2+ DTRs and 2+ pulses.  Lymph: No lymphadenopathy of posterior or anterior cervical chain or axillae bilaterally.  Gait antalgic gait MSK:  Non tender with full range of motion and good stability and  symmetric strength and tone of shoulders, elbows, wrist, hip, and ankles bilaterally.  Knee: Left Mild valgus deformity noted moderate tenderness to palpation over the medial and lateral joint lines. Moderate improvement over the iliotibial band ROM full in flexion and extension and lower leg rotation. Ligaments with solid consistent endpoints including ACL, PCL, LCL, MCL. Negative Mcmurray's, Apley's, and Thessalonian tests.  painful patellar compression. Patellar glide with moderate crepitus. Patellar and quadriceps tendons unremarkable. Hamstring and quadriceps strength is normal.   Procedure: Real-time Ultrasound Guided Injection of left knee Device: GE Logiq E  Ultrasound guided injection is preferred based studies that show increased duration, increased effect, greater accuracy, decreased procedural pain, increased response rate, and decreased cost with ultrasound guided versus blind injection.  Verbal informed consent obtained.  Time-out conducted.  Noted no overlying erythema, induration, or other signs of local infection.  Skin prepped in a sterile fashion.  Local anesthesia: Topical Ethyl chloride.  With sterile technique and under real time ultrasound guidance: With a 22-gauge 2 inch needle patient was injected with 4 cc of 0.5% Marcaine and 1 cc of Kenalog 40 mg/dL. This was from a superior lateral approach.  Completed without difficulty  Pain immediately resolved suggesting accurate placement of the medication.  Advised to call if fevers/chills, erythema, induration, drainage, or persistent bleeding.  Images permanently stored and available for review in the ultrasound unit.  Impression: Technically successful ultrasound guided injection.   Impression and Recommendations:     This case required medical decision making of moderate complexity.

## 2014-08-04 NOTE — Progress Notes (Signed)
Pre visit review using our clinic review tool, if applicable. No additional management support is needed unless otherwise documented below in the visit note. 

## 2014-08-04 NOTE — Assessment & Plan Note (Signed)
Patient was given an injection today. We discussed icing regimen and home exercises. We will see how patient does. We discussed the possibility of x-rays the patient will wait until follow-up. She will continue the home exercises and patient did get a compression sleeve. We discussed a stabilizing brace the patient wants to wait until next visit. We discussed formal physical therapy and patient was Visit. Patient come back and see me again in 3-4 weeks.

## 2014-08-04 NOTE — Patient Instructions (Signed)
Good to see you Ice in 6 hours Start exercises again in 2 days I like the compression and we can consider a bigger brace if needed Still have PT or other injections Continue the vitamins See me again in 3 weels

## 2014-08-12 ENCOUNTER — Other Ambulatory Visit: Payer: Self-pay | Admitting: Internal Medicine

## 2014-08-25 ENCOUNTER — Ambulatory Visit (INDEPENDENT_AMBULATORY_CARE_PROVIDER_SITE_OTHER): Payer: 59 | Admitting: Family Medicine

## 2014-08-25 ENCOUNTER — Encounter: Payer: Self-pay | Admitting: Family Medicine

## 2014-08-25 VITALS — BP 138/74 | HR 93 | Ht 68.0 in | Wt 299.0 lb

## 2014-08-25 DIAGNOSIS — IMO0002 Reserved for concepts with insufficient information to code with codable children: Secondary | ICD-10-CM

## 2014-08-25 DIAGNOSIS — M129 Arthropathy, unspecified: Secondary | ICD-10-CM

## 2014-08-25 NOTE — Patient Instructions (Signed)
Good to see you Ice is your friend We will get you a brace soon.  Add tylenol 325mg -650mg  3 times daily COnitnue the vitamins See me again in 6 weeks or call if you need me sooner. In 6 weeks we can do another steroid injection.

## 2014-08-25 NOTE — Progress Notes (Signed)
  [facility] [place], [place] [address] Phone: [phone] Subjective:    CC: Left knee pain follow-up  NID:[id number] Amy Stokes is a 64 y.o. female coming in with complaint of  left knee pain. Patient does have severe osteoarthritic changes of the left knee. She was given an injection at last exam. Patient states that she is doing approximately 60% better. Patient states that this pain is slowing her down but does not stop her from any activity. Denies any nighttime awakening. States that those does feel that she has some instability of the knee from time to time. Patient feels that the compression sleeve is not giving her enough support and she is wondering if anything else would be possible. Denies any significant new symptoms except for some of the worsening of instability.    Past medical history, social, surgical and family history all reviewed in electronic medical record.   Review of Systems: No headache, visual changes, nausea, vomiting, diarrhea, constipation, dizziness, abdominal pain, skin rash, fevers, chills, night sweats, weight loss, swollen lymph nodes, body aches, joint swelling, muscle aches, chest pain, shortness of breath, mood changes.   Objective Blood pressure 138/74, pulse 93, height 5\' 8"  (1.727 m), weight 299 lb (135.626 kg), SpO2 92 %.  General: No apparent distress alert and oriented x3 mood and affect normal, dressed appropriately. obese HEENT: Pupils equal, extraocular movements intact  Respiratory: Patient's speak in full sentences and does not appear short of breath  Cardiovascular: No lower extremity edema, non tender, no erythema  Skin: Warm dry intact with no signs of infection or rash on extremities or on axial skeleton.  Abdomen: Soft nontender  Neuro: Cranial nerves II through XII are intact, neurovascularly intact in all extremities with 2+ DTRs and 2+ pulses.  Lymph: No lymphadenopathy of posterior or  anterior cervical chain or axillae bilaterally.  Gait antalgic gait MSK:  Non tender with full range of motion and good stability and symmetric strength and tone of shoulders, elbows, wrist, hip, and ankles bilaterally.  Knee: Left Mild valgus deformity noted Mild tenderness to palpation over the medial and lateral joint lines. This is a mild improvement from previous exam. ROM full in flexion and extension and lower leg rotation. Ligaments with solid consistent endpoints including ACL, PCL, LCL, MCL. Negative Mcmurray's, Apley's, and Thessalonian tests.  painful patellar compression. Patellar glide with moderate crepitus. Patellar and quadriceps tendons unremarkable. Hamstring and quadriceps strength is normal.      Impression and Recommendations:     This case required medical decision making of moderate complexity.

## 2014-08-25 NOTE — Assessment & Plan Note (Signed)
Patient's pain is improved. This is very moderate improvement from previous exam. Patient though does have some instability. Encourage patient to stay active as well as the importance of weight loss. Patient will be sized for a customized OA brace in the near future secondary to the instability of the knee. We discussed the possibility of viscous supplementation and patient will call if she changes her mind. Otherwise patient will follow-up in 6-8 weeks. At that time we could consider another sterile injection.  Spent  25 minutes with patient face-to-face and had greater than 50% of counseling including as described above in assessment and plan.

## 2014-08-25 NOTE — Progress Notes (Signed)
Pre visit review using our clinic review tool, if applicable. No additional management support is needed unless otherwise documented below in the visit note. 

## 2014-09-05 ENCOUNTER — Other Ambulatory Visit: Payer: Self-pay

## 2014-09-05 DIAGNOSIS — Z1231 Encounter for screening mammogram for malignant neoplasm of breast: Secondary | ICD-10-CM

## 2014-09-11 ENCOUNTER — Encounter: Payer: Self-pay | Admitting: Internal Medicine

## 2014-09-13 NOTE — Telephone Encounter (Signed)
If you donated blood  Only 17 days ago low hg is probably  Or most likely From that and need to give it more time another month or so  to come back up to normal .  Would fu here if actively bleeding . Or need to recheck  Blood count.

## 2014-09-29 ENCOUNTER — Ambulatory Visit
Admission: RE | Admit: 2014-09-29 | Discharge: 2014-09-29 | Disposition: A | Discharge status: Home / Self Care | Payer: 59 | Source: Ambulatory Visit

## 2014-09-29 ENCOUNTER — Encounter: Payer: Self-pay | Admitting: Gynecology

## 2014-09-29 ENCOUNTER — Other Ambulatory Visit (HOSPITAL_COMMUNITY)
Admission: RE | Admit: 2014-09-29 | Discharge: 2014-09-29 | Disposition: A | Discharge status: Home / Self Care | Payer: 59 | Source: Ambulatory Visit | Attending: Gynecology | Admitting: Gynecology

## 2014-09-29 ENCOUNTER — Ambulatory Visit (INDEPENDENT_AMBULATORY_CARE_PROVIDER_SITE_OTHER): Payer: 59 | Admitting: Gynecology

## 2014-09-29 VITALS — BP 136/78 | Ht 68.0 in | Wt 297.0 lb

## 2014-09-29 DIAGNOSIS — Z1231 Encounter for screening mammogram for malignant neoplasm of breast: Secondary | ICD-10-CM

## 2014-09-29 DIAGNOSIS — Z780 Asymptomatic menopausal state: Secondary | ICD-10-CM | POA: Diagnosis not present

## 2014-09-29 DIAGNOSIS — Z01419 Encounter for gynecological examination (general) (routine) without abnormal findings: Secondary | ICD-10-CM | POA: Insufficient documentation

## 2014-09-29 NOTE — Progress Notes (Signed)
TAVI GAUGHRAN May 06, 1950 [id number]   History:    64 y.o.  for annual gyn exam with no complaints today. In 2015 patient had a total abdominal hysterectomy with bilateral salpingo-oophorectomy as a result of postmenopausal bleeding endometrial hyperplasia and fibroid uterus. Concurrently she had an umbilical hernia repair by Dr. Saunders Glance general surgeon and has done well. Her PCP is Dr. Regis Bill and has been doing her blood work. It has been many years as patient's last bone density study. Patient states her immunizations are up-to-date she has not had the shingles vaccine yet. She had a normal colonoscopy in 2009. Prior to her hysterectomy she had O's had normal Pap smears. Patient with no past history of hormone replacement therapy.  Past medical history,surgical history, family history and social history were all reviewed and documented in the EPIC chart.  Gynecologic History No LMP recorded. Patient has had a hysterectomy. Contraception: status post hysterectomy Last Pap: 2014. Results were: normal Last mammogram: 2015. Results were: normal  Obstetric History OB History  Gravida Para Term Preterm AB SAB TAB Ectopic Multiple Living  1 0   1 1    0    # Outcome Date GA Lbr Len/2nd Weight Sex Delivery Anes PTL Lv  1 SAB                ROS: A ROS was performed and pertinent positives and negatives are included in the history.  GENERAL: No fevers or chills. HEENT: No change in vision, no earache, sore throat or sinus congestion. NECK: No pain or stiffness. CARDIOVASCULAR: No chest pain or pressure. No palpitations. PULMONARY: No shortness of breath, cough or wheeze. GASTROINTESTINAL: No abdominal pain, nausea, vomiting or diarrhea, melena or bright red blood per rectum. GENITOURINARY: No urinary frequency, urgency, hesitancy or dysuria. MUSCULOSKELETAL: No joint or muscle pain, no back pain, no recent trauma. DERMATOLOGIC: No rash, no itching, no lesions. ENDOCRINE: No polyuria,  polydipsia, no heat or cold intolerance. No recent change in weight. HEMATOLOGICAL: No anemia or easy bruising or bleeding. NEUROLOGIC: No headache, seizures, numbness, tingling or weakness. PSYCHIATRIC: No depression, no loss of interest in normal activity or change in sleep pattern.     Exam: chaperone present  BP 136/78 mmHg  Ht 5\' 8"  (1.727 m)  Wt 297 lb (134.718 kg)  BMI 45.17 kg/m2  Body mass index is 45.17 kg/(m^2).  General appearance : Well developed well nourished female. No acute distress HEENT: Eyes: no retinal hemorrhage or exudates,  Neck supple, trachea midline, no carotid bruits, no thyroidmegaly Lungs: Clear to auscultation, no rhonchi or wheezes, or rib retractions  Heart: Regular rate and rhythm, no murmurs or gallops Breast:Examined in sitting and supine position were symmetrical in appearance, no palpable masses or tenderness,  no skin retraction, no nipple inversion, no nipple discharge, no skin discoloration, no axillary or supraclavicular lymphadenopathy Abdomen: no palpable masses or tenderness, no rebound or guarding Extremities: no edema or skin discoloration or tenderness  Pelvic:  Bartholin, Urethra, Skene Glands: Within normal limits             Vagina: No gross lesions or discharge, atrophic changes  Cervix: Absent  Uterus  absent  Adnexa  Without masses or tenderness  Anus and perineum  normal   Rectovaginal  normal sphincter tone without palpated masses or tenderness             Hemoccult cards provided     Assessment/Plan:  64 y.o. female for annual exam postmenopausal  but no vasomotor symptoms no history of hormone replacement therapy in the past. Status post total abdominal hysterectomy bilateral salpingo-oophorectomy in 2015 as a result of postmenopausal bleeding, fibroid uterus and endometrial hyperplasia has done well. Concurrently she had an umbilical hernia repair done by general surgeon and has done well. Pap smear was done today. Blood work  by PCP. Bone density to be schedule. Mammogram today. Patient to submit to the office the fecal Hemoccult cards for testing.   Terrance Mass MD, 10:14 AM 09/29/2014

## 2014-09-29 NOTE — Patient Instructions (Signed)

## 2014-10-03 LAB — CYTOLOGY - PAP

## 2014-10-06 ENCOUNTER — Ambulatory Visit (INDEPENDENT_AMBULATORY_CARE_PROVIDER_SITE_OTHER): Payer: 59 | Admitting: Family Medicine

## 2014-10-06 ENCOUNTER — Encounter: Payer: Self-pay | Admitting: Family Medicine

## 2014-10-06 VITALS — BP 134/68 | HR 84 | Ht 68.0 in | Wt 303.0 lb

## 2014-10-06 DIAGNOSIS — M129 Arthropathy, unspecified: Secondary | ICD-10-CM

## 2014-10-06 DIAGNOSIS — IMO0002 Reserved for concepts with insufficient information to code with codable children: Secondary | ICD-10-CM

## 2014-10-06 NOTE — Assessment & Plan Note (Signed)
Patient is doing very well at this time. Patient does have end-stage osteophytes arthritis of the knee. We discussed continuing the icing which patient has not been doing. Patient has topical anti-inflammatory signals also do she has any exacerbations. Patient can call if she ever needs another injection and patient could still be a candidate for viscous supplementation if necessary. Patient was unable to get the custom brace secondary to financial constraints. Patient will continue to discuss with me if she changes her mind.

## 2014-10-06 NOTE — Patient Instructions (Signed)
Good to see you Ice is your friend Continue to stay active Conitnue the vitamins See me when you need me and call and we will do injection when needed ;)

## 2014-10-06 NOTE — Progress Notes (Signed)
Pre visit review using our clinic review tool, if applicable. No additional management support is needed unless otherwise documented below in the visit note. 

## 2014-10-06 NOTE — Progress Notes (Signed)
  [facility] [address] [place], [place] [address] Phone: [phone] Subjective:    CC: Left knee pain follow-up  [id number] Amy Stokes is a 64 y.o. female coming in with complaint of  left knee pain. Patient does have severe osteoarthritic changes of the left knee. Patient is now 3 months out since her last her an injection of the knee. Patient states that she still does very well. Not even need the brace or using Tylenol on a regular basis. Continues of vitamins as well as doing the exercises. Patient has not needed any icing. Patient has remain active. Overall patient is happy with the results.    Past medical history, social, surgical and family history all reviewed in electronic medical record.   Review of Systems: No headache, visual changes, nausea, vomiting, diarrhea, constipation, dizziness, abdominal pain, skin rash, fevers, chills, night sweats, weight loss, swollen lymph nodes, body aches, joint swelling, muscle aches, chest pain, shortness of breath, mood changes.   Objective Blood pressure 134/68, pulse 84, height 5\' 8"  (1.727 m), weight 303 lb (137.44 kg), SpO2 91 %.  General: No apparent distress alert and oriented x3 mood and affect normal, dressed appropriately. obese HEENT: Pupils equal, extraocular movements intact  Respiratory: Patient's speak in full sentences and does not appear short of breath  Cardiovascular: No lower extremity edema, non tender, no erythema  Skin: Warm dry intact with no signs of infection or rash on extremities or on axial skeleton.  Abdomen: Soft nontender  Neuro: Cranial nerves II through XII are intact, neurovascularly intact in all extremities with 2+ DTRs and 2+ pulses.  Lymph: No lymphadenopathy of posterior or anterior cervical chain or axillae bilaterally.  Gait antalgic gait MSK:  Non tender with full range of motion and good stability and symmetric strength and tone of shoulders, elbows, wrist,  hip, and ankles bilaterally.  Knee: Left Mild valgus deformity noted Any tenderness over the medial and lateral joint lines ROM full in flexion and extension and lower leg rotation. Ligaments with solid consistent endpoints including ACL, PCL, LCL, MCL. Negative Mcmurray's, Apley's, and Thessalonian tests.  painful patellar compression still noted Patellar glide with moderate crepitus. Patellar and quadriceps tendons unremarkable. Hamstring and quadriceps strength is normal.      Impression and Recommendations:     This case required medical decision making of moderate complexity.

## 2014-10-19 ENCOUNTER — Other Ambulatory Visit: Payer: Self-pay | Admitting: Gynecology

## 2014-10-19 ENCOUNTER — Ambulatory Visit (INDEPENDENT_AMBULATORY_CARE_PROVIDER_SITE_OTHER): Payer: 59

## 2014-10-19 DIAGNOSIS — Z13820 Encounter for screening for osteoporosis: Secondary | ICD-10-CM | POA: Diagnosis not present

## 2014-10-19 DIAGNOSIS — Z780 Asymptomatic menopausal state: Secondary | ICD-10-CM

## 2014-11-23 ENCOUNTER — Encounter: Payer: Self-pay | Admitting: Gastroenterology

## 2015-04-09 ENCOUNTER — Ambulatory Visit: Payer: Self-pay | Admitting: Internal Medicine

## 2015-04-09 ENCOUNTER — Telehealth: Payer: Self-pay | Admitting: Internal Medicine

## 2015-04-09 NOTE — Telephone Encounter (Signed)
Can refill advair x 1  office visit and  Get Note from her orthodontist about the other  .  To discuss need for blood tests .

## 2015-04-09 NOTE — Telephone Encounter (Signed)
Pt needs new rx advair call into new pharm [facility]. Pt orthodontic would like pt to have blood work to check vit d 3 and vit b 5 level. . Can I sch?

## 2015-04-10 MED ORDER — FLUTICASONE-SALMETEROL 100-50 MCG/DOSE IN AEPB: INHALATION_SPRAY | RESPIRATORY_TRACT | Status: DC

## 2015-04-10 NOTE — Telephone Encounter (Signed)
Advair sent to the pharmacy.  Please help the pt to make appointment to discuss needed lab work.  Have pt call dentist to send over his/her office note.

## 2015-04-10 NOTE — Telephone Encounter (Addendum)
lmom for pt to call back

## 2015-04-11 NOTE — Telephone Encounter (Signed)
Pt will have dentist fax over progress note

## 2015-04-17 NOTE — Telephone Encounter (Signed)
lmom  For pt to call back

## 2015-04-17 NOTE — Telephone Encounter (Signed)
Pt has been scheduled.  Advised pt to call dentist again to get that paperwork over here prior to appointment.

## 2015-04-17 NOTE — Telephone Encounter (Signed)
Pt needs labs prior to appt  With orthodontist on 1/31.  Is it ok to use a same day?  Pt is having sleep study done on 1/30 through her orthodontist. Pt states she did request ortho to send office notes.[name]

## 2015-04-17 NOTE — Telephone Encounter (Signed)
Ok to use same day.  Make sure plenty of other slots left open on that day.  Still waiting on note from dentist.  Please get note.

## 2015-04-19 ENCOUNTER — Ambulatory Visit (INDEPENDENT_AMBULATORY_CARE_PROVIDER_SITE_OTHER): Payer: BLUE CROSS/BLUE SHIELD | Admitting: Internal Medicine

## 2015-04-19 ENCOUNTER — Encounter: Payer: Self-pay | Admitting: Internal Medicine

## 2015-04-19 VITALS — BP 140/70 | Temp 97.7°F | Wt 307.3 lb

## 2015-04-19 DIAGNOSIS — E049 Nontoxic goiter, unspecified: Secondary | ICD-10-CM

## 2015-04-19 DIAGNOSIS — E041 Nontoxic single thyroid nodule: Secondary | ICD-10-CM

## 2015-04-19 DIAGNOSIS — G478 Other sleep disorders: Secondary | ICD-10-CM

## 2015-04-19 DIAGNOSIS — G4733 Obstructive sleep apnea (adult) (pediatric): Secondary | ICD-10-CM

## 2015-04-19 DIAGNOSIS — Z23 Encounter for immunization: Secondary | ICD-10-CM | POA: Diagnosis not present

## 2015-04-19 DIAGNOSIS — Z6841 Body Mass Index (BMI) 40.0 and over, adult: Secondary | ICD-10-CM

## 2015-04-19 DIAGNOSIS — I10 Essential (primary) hypertension: Secondary | ICD-10-CM

## 2015-04-19 DIAGNOSIS — G4730 Sleep apnea, unspecified: Secondary | ICD-10-CM

## 2015-04-19 DIAGNOSIS — J45909 Unspecified asthma, uncomplicated: Secondary | ICD-10-CM

## 2015-04-19 LAB — BASIC METABOLIC PANEL
BUN: 13 mg/dL (ref 6–23)
CO2: 35 mEq/L — ABNORMAL HIGH (ref 19–32)
Calcium: 9 mg/dL (ref 8.4–10.5)
Chloride: 100 mEq/L (ref 96–112)
Creatinine, Ser: 0.63 mg/dL (ref 0.40–1.20)
GFR: 101.1 mL/min (ref >60.00)
Glucose, Bld: 106 mg/dL — ABNORMAL HIGH (ref 70–99)
Potassium: 4 mEq/L (ref 3.5–5.1)
Sodium: 141 mEq/L (ref 135–145)

## 2015-04-19 LAB — HEMOGLOBIN A1C: Hgb A1c MFr Bld: 6.1 % (ref 4.6–6.5)

## 2015-04-19 LAB — VITAMIN D 25 HYDROXY (VIT D DEFICIENCY, FRACTURES): VITD: 38.91 ng/mL (ref 30.00–100.00)

## 2015-04-19 LAB — T4, FREE: Free T4: 0.82 ng/dL (ref 0.60–1.60)

## 2015-04-19 LAB — TSH: TSH: 3.19 u[IU]/mL (ref 0.35–4.50)

## 2015-04-19 NOTE — Patient Instructions (Addendum)
Will arrange  A second option about the thyroid nodule.   Will notify you  of labs when available. b5 is pantothenic acid and a very rare cause of deficincenty in this coutnrty    Continue lifestyle intervention healthy eating and exercise .  cpx wellness checkup in 6 months or as needed

## 2015-04-19 NOTE — Assessment & Plan Note (Signed)
Oral appliance seems to help

## 2015-04-19 NOTE — Progress Notes (Signed)
Pre visit review using our clinic review tool, if applicable. No additional management support is needed unless otherwise documented below in the visit note.  Chief Complaint  Patient presents with  . Follow-up    othodontist advise blood work fu other    HPI:  Amy Stokes 65 y.o.  Last seen 4 16  With hx of asthma obesity  osa thyroid nodule  Comes in after orthodontist advised check vit d  And b5 levels .that can effect sleep taking 2000 iu vit d3.   Has been on oral appliance for a year and less apneas and  More energy.   bp 120 usually 130  Up today taking meds   Has gained weight recently.  [facility]   Thyroid  Last endo advised thyroidectomy and she " isnt ready to go that route" has had 2 indeterminations  Last one over a year ago and last [place] over a year ago . " Feels fine"  Asthma stable as long as on advair   ROS: See pertinent positives and negatives per HPI.  Past Medical History  Diagnosis Date  . Hypertension   . Obesity   . Fibroids   . Edema   . Asthma   . Hyperlipidemia   . Thyroid nodule 01/2009    left discovered on chest ct; needle bx "indeterminant" cannon r/o follicular neoplasm     Family History  Problem Relation Age of Onset  . Arthritis Mother   . Other Mother     neurological disorder  . Pancreatic cancer Mother   . Heart disease Father   . Thyroid nodules Sister   . Heart disease Brother     x2 1 brother deceased    Social History   Social History  . Marital Status: Married    Spouse Name: N/A  . Number of Children: 0  . Years of Education: N/A   Occupational History  . retired    Social History Main Topics  . Smoking status: Former Smoker -- 1.00 packs/day for 1 years    Types: Cigarettes    Quit date: 04/29/1971  . Smokeless tobacco: Never Used  . Alcohol Use: 0.0 oz/week    0 Standard drinks or equivalent per week     Comment: occ  . Drug Use: No  . Sexual Activity: No    Other Topics Concern  . None   Social History Narrative   Married   Occupation: Chiropractor currently unemployed   Married   Regular exercise-no   [place] of 2   Pet cat          Outpatient Prescriptions Prior to Visit  Medication Sig Dispense Refill  . aspirin 81 MG tablet Take 81 mg by mouth daily.    . Cholecalciferol (VITAMIN D3) 2000 UNITS TABS Take 1 tablet by mouth daily.    . Fluticasone-Salmeterol (ADVAIR DISKUS) 100-50 MCG/DOSE AEPB INHALE 1 PUFF INTO THE LUNGS TWICE A DAY OR AS DIRECTED 60 each 0  . loratadine (CLARITIN) 10 MG tablet Take 10 mg by mouth daily.    [name] losartan (COZAAR) 100 MG tablet TAKE 1/2 TO 1 TABLET BY MOUTH EVERY DAY FOR HYPERTENSION 30 tablet 11  . MULTIPLE VITAMIN PO Take 1 tablet by mouth 2 (two) times daily. Vitafusion.    . Probiotic Product (PROBIOTIC DAILY PO) Take 1 tablet by mouth 2 (two) times daily. Insync    . albuterol (PROAIR HFA) 108 (90 BASE)  MCG/ACT inhaler Inhale 2 puffs into the lungs every 6 (six) hours as needed for wheezing. (Patient not taking: Reported on 04/19/2015) 1 Inhaler 2  . Diclofenac Sodium 2 % SOLN Apply 1 pump twice daily. 112 g 3  . OVER THE COUNTER MEDICATION Tumeric 500mg  daily.     No facility-administered medications prior to visit.     EXAM:  BP 140/70 mmHg  Temp(Src) 97.7 F (36.5 C) (Oral)  Wt 307 lb 4.8 oz (139.39 kg)  Body mass index is 46.74 kg/(m^2).  GENERAL: vitals reviewed and listed above, alert, oriented, appears well hydrated and in no acute distress HEENT: atraumatic, conjunctiva  clear, no obvious abnormalities on inspection of external nose and ears OP : no lesion edema or exudate  Low palate  NECK:  Palpable thyroid no adenipathy  LUNGS: clear to auscultation bilaterally, no wheezes, rales or rhonchi, good air movement CV: HRRR, no clubbing cyanosis or  nl cap refill  MS: moves all extremities without noticeable focal  abnormality PSYCH: pleasant and cooperative, no obvious  depression or anxiety Lab Results  Component Value Date   WBC 8.3 07/14/2014   HGB 13.1 07/14/2014   HCT 39.5 07/14/2014   PLT 249.0 07/14/2014   GLUCOSE 106* 07/14/2014   CHOL 177 07/14/2014   TRIG 72.0 07/14/2014   HDL 44.40 07/14/2014   LDLDIRECT 141.7 04/03/2011   LDLCALC 118* 07/14/2014   ALT 19 07/14/2014   AST 14 07/14/2014   NA 137 07/14/2014   K 3.8 07/14/2014   CL 99 07/14/2014   CREATININE 0.59 07/14/2014   BUN 15 07/14/2014   CO2 33* 07/14/2014   TSH 2.94 07/14/2014   INR 0.98 08/02/2013   BP Readings from Last 3 Encounters:  04/19/15 140/70  10/06/14 134/68  09/29/14 136/78   Wt Readings from Last 3 Encounters:  04/19/15 307 lb 4.8 oz (139.39 kg)  10/06/14 303 lb (137.44 kg)  09/29/14 297 lb (134.718 kg)     ASSESSMENT AND PLAN:  Discussed the following assessment and plan:  Sleep-disordered breathing - oral apploance for osa  - Plan: VITAMIN D 25 Hydroxy (Vit-D Deficiency, Fractures), Basic metabolic panel, Vitamin 99991111 Acid), Hemoglobin A1c, TSH, T4, free  THYROID NODULE - indetramnant x 2  no sx checklabs  get another endo opinion. - Plan: VITAMIN D 25 Hydroxy (Vit-D Deficiency, Fractures), Basic metabolic panel, Vitamin 99991111 Acid), Hemoglobin A1c, TSH, T4, free  Morbid obesity with BMI of 40.0-44.9, adult ([place]) - she is working [employer]  - Plan: VITAMIN D 25 Hydroxy (Vit-D Deficiency, Fractures), Basic metabolic panel, Vitamin 99991111 Acid), Hemoglobin A1c, TSH, T4, free  Essential hypertension - bmp today - Plan: VITAMIN D 25 Hydroxy (Vit-D Deficiency, Fractures), Basic metabolic panel, Vitamin 99991111 Acid), Hemoglobin A1c, TSH, T4, free  Need for prophylactic vaccination and inoculation against influenza - Plan: Flu Vaccine QUAD 36+ mos PF IM (Fluarix & Fluzone Quad PF)  Asthma, unspecified asthma severity, uncomplicated - stable controlled by hx on advair  OSA (obstructive sleep apnea)  -Patient advised to return  or notify health care team  if symptoms worsen ,persist or new concerns arise.  Patient Instructions  Will arrange  A second option about the thyroid nodule.   Will notify you  of labs when available. b5 is pantothenic acid and a very rare cause of deficincenty in this coutnrty    Continue lifestyle intervention healthy eating and exercise .  cpx wellness checkup in 6 months or as needed    [name]. Panosh  M.D.   

## 2015-04-26 LAB — VITAMIN B5 (PANTOTHENIC ACID): Vitamin B5(Pantothenic Acid): 108 ng/mL (ref <275)

## 2015-05-08 ENCOUNTER — Other Ambulatory Visit: Payer: Self-pay | Admitting: Endocrinology

## 2015-05-08 ENCOUNTER — Other Ambulatory Visit: Payer: Self-pay | Admitting: Family Medicine

## 2015-05-08 DIAGNOSIS — E049 Nontoxic goiter, unspecified: Secondary | ICD-10-CM

## 2015-05-08 MED ORDER — FLUTICASONE-SALMETEROL 100-50 MCG/DOSE IN AEPB: INHALATION_SPRAY | RESPIRATORY_TRACT | Status: DC

## 2015-05-08 NOTE — Telephone Encounter (Signed)
Sent to the pharmacy by e-scribe. 

## 2015-06-13 ENCOUNTER — Telehealth: Payer: Self-pay | Admitting: Internal Medicine

## 2015-06-13 DIAGNOSIS — G4730 Sleep apnea, unspecified: Secondary | ICD-10-CM

## 2015-06-13 NOTE — Telephone Encounter (Signed)
Pt has mouth appliance for sleep apnea through her orthodonist. However pt continues to have elevated CO2, and wants to know if Dr Regis Bill could refer her to a pulmonologist?  Pt states orthodontist wants to do an "in house" sleep study. Pt has done several home sleep studies with this orthodontist, and has not gotten anywhere. Please advise

## 2015-06-17 NOTE — Telephone Encounter (Signed)
Please do referral to pulmonary for sleep evaluation. Have her take any records  Of sleep tests etc with her .  Dx sleep disordered breathing.

## 2015-06-18 NOTE — Telephone Encounter (Signed)
Pt notified that referral has been placed in the system.  Advised to take any test results with her.  She will get copies from the orthodontist.

## 2015-08-09 ENCOUNTER — Encounter: Payer: Self-pay | Admitting: Pulmonary Disease

## 2015-08-09 ENCOUNTER — Ambulatory Visit (INDEPENDENT_AMBULATORY_CARE_PROVIDER_SITE_OTHER): Payer: BLUE CROSS/BLUE SHIELD | Admitting: Pulmonary Disease

## 2015-08-09 VITALS — BP 152/62 | HR 89 | Ht 68.0 in | Wt 307.0 lb

## 2015-08-09 DIAGNOSIS — R05 Cough: Secondary | ICD-10-CM | POA: Diagnosis not present

## 2015-08-09 DIAGNOSIS — J45909 Unspecified asthma, uncomplicated: Secondary | ICD-10-CM

## 2015-08-09 DIAGNOSIS — R059 Cough, unspecified: Secondary | ICD-10-CM | POA: Insufficient documentation

## 2015-08-09 DIAGNOSIS — G4733 Obstructive sleep apnea (adult) (pediatric): Secondary | ICD-10-CM | POA: Diagnosis not present

## 2015-08-09 NOTE — Progress Notes (Signed)
Subjective:    Patient ID: Amy Stokes, female    DOB: [birth year], 65 y.o.   MRN: [id number]  HPI  Pt is being seen for OSA.  ROV 08/09/2015 Pt returns to clinic as f/u on her OSA. Last seen in 01/2014. She ended up having an oral device by Dr. Ron Parker x 1 yr. She has had studies after her oral device. Pt uses oral device and is still snoring. Still has hypersomnia despite oral device. HCO3 remains high. Asthma has been stable. Uses advair. Dxed with hypothyroidism recently.    Review of Systems  Constitutional: Negative.   HENT: Negative.   Eyes: Negative.   Respiratory: Positive for cough and shortness of breath.   Gastrointestinal: Negative.   Endocrine: Negative.   Genitourinary: Negative.   Musculoskeletal: Negative.   Allergic/Immunologic: Negative.   Neurological: Negative.   Hematological: Negative.   Psychiatric/Behavioral: Negative.   All other systems reviewed and are negative.      Past Medical History  Diagnosis Date  . Hypertension   . Obesity   . Fibroids   . Edema   . Asthma   . Hyperlipidemia   . Thyroid nodule 01/2009    left discovered on chest ct; needle bx "indeterminant" cannon r/o follicular neoplasm      Family History  Problem Relation Age of Onset  . Arthritis Mother   . Other Mother     neurological disorder  . Pancreatic cancer Mother   . Heart disease Father   . Thyroid nodules Sister   . Heart disease Brother     x2 1 brother deceased     Past Surgical History  Procedure Laterality Date  . Thyroid needle bx    . Mouth surgery    . Tonsillectomy    . Abdominal hysterectomy Bilateral 08/02/2013    Procedure: TOTAL ABDOMINAL HYSTERECTOMY WITH BILATERAL SALPINGO-OOPHORECTOMY ;  Surgeon: Terrance Mass, MD;  Location: [facility];  Service: Gynecology;  Laterality: Bilateral;    Dr. Phineas Real to assist.  Dr. Zella Richer to follow with an umbilical hernia repair. He will need an additional 1 1/2 hours.   . Umbilical hernia repair N/A  08/02/2013    Procedure: HERNIA REPAIR UMBILICAL ADULT WITH MESH;  Surgeon: Odis Hollingshead, MD;  Location: [facility];  Service: General;  Laterality: N/A;    Social History   Social History  . Marital Status: Married    Spouse Name: N/A  . Number of Children: 0  . Years of Education: N/A   Occupational History  . retired    Social History Main Topics  . Smoking status: Former Smoker -- 1.00 packs/day for 1 years    Types: Cigarettes    Quit date: 04/29/1971  . Smokeless tobacco: Never Used  . Alcohol Use: 0.0 oz/week    0 Standard drinks or equivalent per week     Comment: occ  . Drug Use: No  . Sexual Activity: No   Other Topics Concern  . Not on file   Social History Narrative   Married   Occupation: Chiropractor currently unemployed   Married   Regular exercise-no   [place] of 2   Pet cat           No Known Allergies   Outpatient Prescriptions Prior to Visit  Medication Sig Dispense Refill  . albuterol (PROAIR HFA) 108 (90 BASE) MCG/ACT inhaler Inhale 2 puffs into the lungs every 6 (six) hours as needed for wheezing. 1 Inhaler 2  .  Cholecalciferol (VITAMIN D3) 2000 UNITS TABS Take 3 tablets by mouth daily.     . Fluticasone-Salmeterol (ADVAIR DISKUS) 100-50 MCG/DOSE AEPB INHALE 1 PUFF INTO THE LUNGS TWICE A DAY OR AS DIRECTED 60 each 5  . loratadine (CLARITIN) 10 MG tablet Take 10 mg by mouth daily.    [name] losartan (COZAAR) 100 MG tablet TAKE 1/2 TO 1 TABLET BY MOUTH EVERY DAY FOR HYPERTENSION 30 tablet 11  . MULTIPLE VITAMIN PO Take 1 tablet by mouth 2 (two) times daily. Vitafusion.    . Probiotic Product (PROBIOTIC DAILY PO) Take 1 tablet by mouth 2 (two) times daily. Insync    . aspirin 81 MG tablet Take 81 mg by mouth daily. Reported on 08/09/2015     No facility-administered medications prior to visit.   Meds ordered this encounter  Medications  . levothyroxine (SYNTHROID, LEVOTHROID) 50 MCG tablet    Sig: Take 1 tablet by mouth daily.    Refill:  0  . b  complex vitamins tablet    Sig: Take 1 tablet by mouth daily.        Objective:   Physical Exam  Vitals:  Filed Vitals:   08/09/15 1441  BP: 152/62  Pulse: 89  Height: 5\' 8"  (1.727 m)  Weight: 307 lb (139.254 kg)  SpO2: 94%    Constitutional/General:  Pleasant, well-nourished, well-developed, not in any distress,  Comfortably seating.  Well kempt  Body mass index is 46.69 kg/(m^2). Wt Readings from Last 3 Encounters:  08/09/15 307 lb (139.254 kg)  04/19/15 307 lb 4.8 oz (139.39 kg)  10/06/14 303 lb (137.44 kg)    Neck circumference:   HEENT: Pupils equal and reactive to light and accommodation. Anicteric sclerae. Normal nasal mucosa.   No oral  lesions,  mouth clear,  oropharynx clear, no postnasal drip. (-) Oral thrush. No dental caries.  Airway - Mallampati class IV  Neck: No masses. Midline trachea. No JVD, (-) LAD. (-) bruits appreciated.  Respiratory/Chest: Grossly normal chest. (-) deformity. (-) Accessory muscle use.  Symmetric expansion. (-) Tenderness on palpation.  Resonant on percussion.  Diminished BS on both lower lung zones. (-), crackles, rhonchi  Some wheezing (-) egophony  Cardiovascular: Regular rate and  rhythm, heart sounds normal, no murmur or gallops, no peripheral edema  Gastrointestinal:  Normal bowel sounds. Soft, non-tender. No hepatosplenomegaly.  (-) masses.   Musculoskeletal:  Normal muscle tone. Normal gait.   Extremities: Grossly normal. (-) clubbing, cyanosis.  (-) edema  Skin: (-) rash,lesions seen.   Neurological/Psychiatric : alert, oriented to time, place, person. Normal mood and affect            Assessment & Plan:  OSA (obstructive sleep apnea) HST (01/2014)  AHI 17.   Has hypersomnia which persist despite using oral device. Was seeing Dr. Ron Parker. Pt very symptomatic and has snoring. Husband has OSA and uses cpap and he feels better.  Plan : 1. autocpap 5-15 > we will try. 2. Hold off on oral device  while on cpap. 3. Will need mask fitting session. 4. Needs good DL.    Asthma Stable. Some wheeze. Cont advair 100/50 BID. Alb prn.   MORBID OBESITY Weight loss  Cough flonase and claritin daily. May worsen with cpap.       Patient will follow up with me in 3 mos.     Monica Becton, MD 08/09/2015   6:45 PM [facility] Pager: [phone] Office:  [phone], Fax: [phone]

## 2015-08-09 NOTE — Assessment & Plan Note (Signed)
Stable. Some wheeze. Cont advair 100/50 BID. Alb prn.

## 2015-08-09 NOTE — Assessment & Plan Note (Signed)
flonase and claritin daily. May worsen with cpap.

## 2015-08-09 NOTE — Patient Instructions (Signed)
1. We will try to order an auto CPAP 5-15 cm water without repeating your  sleep study if your insurance will cover. 2. Please let us know if you having issues with her CPAP.  Return to clinic in 2-3 mos.

## 2015-08-09 NOTE — Assessment & Plan Note (Addendum)
HST (01/2014)  AHI 17.   Has hypersomnia which persist despite using oral device. Was seeing Dr. Ron Parker. Pt very symptomatic and has snoring. Husband has OSA and uses cpap and he feels better.  Plan : 1. autocpap 5-15 > we will try. 2. Hold off on oral device while on cpap. 3. Will need mask fitting session. 4. Needs good DL.

## 2015-08-09 NOTE — Assessment & Plan Note (Signed)
-   Weight loss 

## 2015-08-10 ENCOUNTER — Ambulatory Visit: Payer: BLUE CROSS/BLUE SHIELD | Admitting: Pulmonary Disease

## 2015-08-16 ENCOUNTER — Telehealth: Payer: Self-pay | Admitting: Pulmonary Disease

## 2015-08-16 ENCOUNTER — Telehealth: Payer: Self-pay | Admitting: Family Medicine

## 2015-08-16 NOTE — Telephone Encounter (Signed)
Spoke with pt .  She decided to get her own cpap machine privately instead of using [facility] due to cost being cheaper.  She ordered cpap machine off of [employer] and cpap supplies/mask from a company her husband uses.  I told pt that we will still need to obtain a download from her machine after she has been using it for a month.  She states she can bring the SD card to [place] if she needs to.

## 2015-08-16 NOTE — Telephone Encounter (Signed)
Spoke to the pt.  Advised her ok to take Mucinex as long as BP is controlled.  She states her cough is slowly getting better.

## 2015-08-16 NOTE — Telephone Encounter (Signed)
Amy Stokes - Client [facility] Patient Name: Amy Stokes Gender: Female DOB: Dec 12, 1950 Age: 65 Y 40 M 17 D Return Phone Number: [phone] (Primary) Address: City/State/Zip: [place] [address] Client [facility] - Client Client Site [place] [facility] Physician Shanon Ace - MD Contact Type Call Who Is Calling Patient / Member / Family / Caregiver Call Type Triage / Clinical Caller Name Lashayla Eure Relationship To Patient Self Return Phone Number [phone] (Primary) Chief Complaint Cold Symptom Reason for Call Medication Question Initial Comment Caller states she has a chest cold and bought Mucinex D and is supposed to ask doctor because she is hypothyroid and on medication for it. Appointment Disposition EMR Appointment Not Necessary Info pasted into Epic No Translation No No Triage Reason Patient declined Nurse Assessment Nurse: Denyse Amass, RN, Benjamine Mola Date/Time (Eastern Time): 08/15/2015 4:21:47 PM Confirm and document reason for call. If symptomatic, describe symptoms. You must click the next button to save text entered. ---Patient states she had had a cold for 4 days. She bought some Mucinex and wants to know if she can take it with Hypothyroidism Has the patient traveled out of the country within the last 30 days? ---Not Applicable Does the patient have any new or worsening symptoms? ---Yes Will a triage be completed? ---No Select reason for no triage. ---Patient declined Please document clinical information provided and list any resource used. ---[email or web address] Drug interaction results for the following 2 drugs: levothyroxine Mucinex D (guaifenesin/pseudoephedrine Interactions between your selected drugs Moderate levothyroxine # pseudoephedrine Applies [id number] and Mucinex D (guaifenesin/pseudoephedrine) Talk to your doctor before  using levothyroxine together with pseudoephedrine. Combining these medications may increase the risk of cardiovascular side effects such as high blood pressure, palpitation, chest pain, and irregular heart beat. If you have preexisting heart disease, your condition may worsen. You may need a dose adjustment or more frequent monitoring by your doctor to safely use both medications. It is important to tell your doctor about all other medications you use, including vitamins and herbs. Do not stop using any medications without first talking to your doctor. PLEASE NOTE: All timestamps contained within this report are represented as [place] Standard Time. CONFIDENTIALTY NOTICE: This fax transmission is intended only for the addressee. It contains information that is legally privileged, confidential or otherwise protected from use or disclosure. If you are not the intended recipient, you are strictly prohibited from reviewing, disclosing, copying using or disseminating any of this information or taking any action in reliance on or regarding this information. If you have received this fax in error, please notify us immediately by telephone so that we can arrange for its return to [place]. Phone: [phone], Toll-Free: [phone], Fax: [phone] Page: 2 of 2 Call Id: [id number] Guidelines Guideline Title Affirmed Question Affirmed Notes Nurse Date/Time Eilene Ghazi Time) Disp. Time Eilene Ghazi Time) Disposition Final User 08/15/2015 4:28:20 PM Clinical Call Yes [place], RN, Benjamine Mola

## 2015-08-16 NOTE — Telephone Encounter (Signed)
Ok to take IF  If  BP controlled    Caution with the  Decongestant portion as this can raise BP . Check BP  readings to ensure not rising  160 and above range .

## 2015-08-31 ENCOUNTER — Other Ambulatory Visit: Payer: Self-pay

## 2015-08-31 NOTE — Telephone Encounter (Signed)
Pt requesting a refill. Last filled on 07/1714 #30 +3, OV 04/19/2015. OKAY TO REFILL?

## 2015-08-31 NOTE — Telephone Encounter (Signed)
caefill through next January when last  Lab monitoring done

## 2015-09-03 ENCOUNTER — Encounter: Payer: Self-pay | Admitting: Internal Medicine

## 2015-09-04 ENCOUNTER — Other Ambulatory Visit: Payer: Self-pay | Admitting: General Practice

## 2015-09-04 MED ORDER — LOSARTAN POTASSIUM 100 MG PO TABS: ORAL_TABLET | ORAL | Status: DC

## 2015-10-09 ENCOUNTER — Other Ambulatory Visit (INDEPENDENT_AMBULATORY_CARE_PROVIDER_SITE_OTHER): Payer: BLUE CROSS/BLUE SHIELD

## 2015-10-09 DIAGNOSIS — Z0000 Encounter for general adult medical examination without abnormal findings: Secondary | ICD-10-CM

## 2015-10-09 LAB — BASIC METABOLIC PANEL
BUN: 13 mg/dL (ref 6–23)
CO2: 34 mEq/L — ABNORMAL HIGH (ref 19–32)
Calcium: 9.4 mg/dL (ref 8.4–10.5)
Chloride: 100 mEq/L (ref 96–112)
Creatinine, Ser: 0.67 mg/dL (ref 0.40–1.20)
GFR: 94.03 mL/min (ref >60.00)
Glucose, Bld: 99 mg/dL (ref 70–99)
Potassium: 3.6 mEq/L (ref 3.5–5.1)
Sodium: 138 mEq/L (ref 135–145)

## 2015-10-09 LAB — LIPID PANEL
Cholesterol: 196 mg/dL (ref 0–200)
HDL: 39.3 mg/dL (ref >39.00)
LDL Cholesterol: 143 mg/dL — ABNORMAL HIGH (ref 0–99)
NonHDL: 156.81
Total CHOL/HDL Ratio: 5
Triglycerides: 68 mg/dL (ref 0.0–149.0)
VLDL: 13.6 mg/dL (ref 0.0–40.0)

## 2015-10-09 LAB — HEPATIC FUNCTION PANEL
ALT: 19 U/L (ref 0–35)
AST: 16 U/L (ref 0–37)
Albumin: 3.7 g/dL (ref 3.5–5.2)
Alkaline Phosphatase: 88 U/L (ref 39–117)
Bilirubin, Direct: 0.1 mg/dL (ref 0.0–0.3)
Total Bilirubin: 0.7 mg/dL (ref 0.2–1.2)
Total Protein: 7.1 g/dL (ref 6.0–8.3)

## 2015-10-09 LAB — CBC WITH DIFFERENTIAL/PLATELET
Basophils Absolute: 0 10*3/uL (ref 0.0–0.1)
Basophils Relative: 0.4 % (ref 0.0–3.0)
Eosinophils Absolute: 0.4 10*3/uL (ref 0.0–0.7)
Eosinophils Relative: 5.2 % — ABNORMAL HIGH (ref 0.0–5.0)
HCT: 39.2 % (ref 36.0–46.0)
Hemoglobin: 13 g/dL (ref 12.0–15.0)
Lymphocytes Relative: 25.4 % (ref 12.0–46.0)
Lymphs Abs: 1.8 10*3/uL (ref 0.7–4.0)
MCHC: 33.2 g/dL (ref 30.0–36.0)
MCV: 84.4 fl (ref 78.0–100.0)
Monocytes Absolute: 0.5 10*3/uL (ref 0.1–1.0)
Monocytes Relative: 6.6 % (ref 3.0–12.0)
Neutro Abs: 4.5 10*3/uL (ref 1.4–7.7)
Neutrophils Relative %: 62.4 % (ref 43.0–77.0)
Platelets: 239 10*3/uL (ref 150.0–400.0)
RBC: 4.65 Mil/uL (ref 3.87–5.11)
RDW: 13.9 % (ref 11.5–15.5)
WBC: 7.3 10*3/uL (ref 4.0–10.5)

## 2015-10-09 LAB — TSH: TSH: 3.76 u[IU]/mL (ref 0.35–4.50)

## 2015-10-15 NOTE — Progress Notes (Signed)
Pre visit review using our clinic review tool, if applicable. No additional management support is needed unless otherwise documented below in the visit note.  Chief Complaint  Patient presents with  . Annual Exam    HPI: Patient  Amy Stokes  65 y.o. comes in today for Preventive Health Care visit  And Chronic disease management  Bp ok  Needs refill med 100 mg per day obesity sugar issues exercising  Eating mostly healthy but eats out and likes desserts  thyroid had fu [place]  If growing  May need thyroidectomy  Asthma stable no flare needs refill advair   Health Maintenance  Topic Date Due  . Hepatitis C Screening  1950-11-21  . HIV Screening  03/30/1966  . ZOSTAVAX  03/31/2011  . MAMMOGRAM  09/29/2015  . PAP SMEAR  09/29/2015  . INFLUENZA VACCINE  10/30/2015  . COLONOSCOPY  12/26/2017  . TETANUS/TDAP  07/29/2023   Health Maintenance Review LIFESTYLE:  Exercise:  gym  4 x per week Tobacco/ETS: n Alcohol: rare Sugar beverages:no Sleep: 6-7 hr   On cpap and  Recent mask changes sleeping better  Drug use: no h of 2 and kitten Donates blood in recent past   ROS:  Leg muscle cramps  ocass  GEN/ HEENT: No fever, significant weight changes sweats headaches vision problems hearing changes, CV/ PULM; No chest pain shortness of breath cough, syncope,edema  change in exercise tolerance. GI /GU: No adominal pain, vomiting, change in bowel habits. No blood in the stool. No significant GU symptoms. SKIN/HEME: ,no acute skin rashes suspicious lesions or bleeding. No lymphadenopathy, nodules, masses.  NEURO/ PSYCH:  No neurologic signs such as weakness numbness. No depression anxiety. IMM/ Allergy: No unusual infections.  Allergy .   REST of 12 system review negative except as per HPI   Past Medical History  Diagnosis Date  . Hypertension   . Obesity   . Fibroids   . Edema   . Asthma   . Hyperlipidemia   . Thyroid nodule 01/2009    left discovered on chest ct; needle bx  "indeterminant" cannon r/o follicular neoplasm     Past Surgical History  Procedure Laterality Date  . Thyroid needle bx    . Mouth surgery    . Tonsillectomy    . Abdominal hysterectomy Bilateral 08/02/2013    Procedure: TOTAL ABDOMINAL HYSTERECTOMY WITH BILATERAL SALPINGO-OOPHORECTOMY ;  Surgeon: Terrance Mass, MD;  Location: [facility];  Service: Gynecology;  Laterality: Bilateral;    Dr. Phineas Real to assist.  Dr. Zella Richer to follow with an umbilical hernia repair. He will need an additional 1 1/2 hours.   . Umbilical hernia repair N/A 08/02/2013    Procedure: HERNIA REPAIR UMBILICAL ADULT WITH MESH;  Surgeon: Odis Hollingshead, MD;  Location: [facility];  Service: General;  Laterality: N/A;    Family History  Problem Relation Age of Onset  . Arthritis Mother   . Other Mother     neurological disorder  . Pancreatic cancer Mother   . Heart disease Father   . Thyroid nodules Sister   . Heart disease Brother     x2 1 brother deceased    Social History   Social History  . Marital Status: Married    Spouse Name: N/A  . Number of Children: 0  . Years of Education: N/A   Occupational History  . retired    Social History Main Topics  . Smoking status: Former Smoker -- 1.00 packs/day for 1 years  Types: Cigarettes    Quit date: 04/29/1971  . Smokeless tobacco: Never Used  . Alcohol Use: 0.0 oz/week    0 Standard drinks or equivalent per week     Comment: occ  . Drug Use: No  . Sexual Activity: No   Other Topics Concern  . None   Social History Narrative   Married   Occupation: Chiropractor currently unemployed   Married   Regular exercise-no   [place] of 2   Pet cat          Outpatient Prescriptions Prior to Visit  Medication Sig Dispense Refill  . albuterol (PROAIR HFA) 108 (90 BASE) MCG/ACT inhaler Inhale 2 puffs into the lungs every 6 (six) hours as needed for wheezing. 1 Inhaler 2  . b complex vitamins tablet Take 1 tablet by mouth daily.    [name] levothyroxine  (SYNTHROID, LEVOTHROID) 50 MCG tablet Take 1 tablet by mouth daily.  0  . loratadine (CLARITIN) 10 MG tablet Take 10 mg by mouth daily.    . MULTIPLE VITAMIN PO Take 1 tablet by mouth 2 (two) times daily. Vitafusion.    . Probiotic Product (PROBIOTIC DAILY PO) Take 1 tablet by mouth 2 (two) times daily. Insync    . Fluticasone-Salmeterol (ADVAIR DISKUS) 100-50 MCG/DOSE AEPB INHALE 1 PUFF INTO THE LUNGS TWICE A DAY OR AS DIRECTED 60 each 5  . losartan (COZAAR) 100 MG tablet TAKE 1/2 TO 1 TABLET BY MOUTH EVERY DAY FOR HYPERTENSION 30 tablet 3  . aspirin 81 MG tablet Take 81 mg by mouth daily. Reported on 10/16/2015    . Cholecalciferol (VITAMIN D3) 2000 UNITS TABS Take 3 tablets by mouth daily.      No facility-administered medications prior to visit.     EXAM:  BP 140/78 mmHg  Temp(Src) 97.9 F (36.6 C) (Oral)  Ht _0  (1.727 m)  Wt 303 lb 6.4 oz (137.621 kg)  BMI 46.14 kg/m2  Body mass index is 46.14 kg/(m^2).  Physical Exam: Vital signs reviewed YDX:AJOI is a well-developed well-nourished alert cooperative    who appearsr stated age in no acute distress.  HEENT: normocephalic atraumatic , Eyes: PERRL EOM's full, conjunctiva clear, Nares: paten,t no deformity discharge or tenderness., Ears: no deformity EAC's clear TMs with normal landmarks. Mouth: clear OP, no lesions, edema.  Moist mucous membranes. Dentition in adequate repair. NECK: supple without masses, thyromegaly or bruits. CHEST/PULM:  Clear to auscultation and percussion breath sounds equal no wheeze , rales or rhonchi. No chest wall deformities or tenderness.Breast: normal by inspection . No dimpling, discharge, masses, tenderness or discharge .left nipple slight inversion ( old no mass) CV: PMI is nondisplaced, S1 S2 no gallops, murmurs, rubs. Peripheral pulses are full without delay.No JVD .  ABDOMEN: Bowel sounds normal nontender  No guard or rebound, no hepato splenomegal no CVA tenderness.  No hernia. Extremtities:  No  clubbing cyanosis  Chronic  Venous edema Changes   Le   No te joint swelling or redness no focal atrophy NEURO:  Oriented x3, cranial nerves 3-12 appear to be intact, no obvious focal weakness,gait within normal limits no abnormal reflexes or asymmetrical SKIN: No acute rashes normal turgor, color, no bruising or petechiae. nails feet thickened  PSYCH: Oriented, good eye contact, no obvious depression anxiety, cognition and judgment appear normal. LN: no cervical axillary inguinal adenopathy  Lab Results  Component Value Date   WBC 7.3 10/09/2015   HGB 13.0 10/09/2015   HCT 39.2 10/09/2015   PLT  239.0 10/09/2015   GLUCOSE 99 10/09/2015   CHOL 196 10/09/2015   TRIG 68.0 10/09/2015   HDL 39.30 10/09/2015   LDLDIRECT 141.7 04/03/2011   LDLCALC 143* 10/09/2015   ALT 19 10/09/2015   AST 16 10/09/2015   NA 138 10/09/2015   K 3.6 10/09/2015   CL 100 10/09/2015   CREATININE 0.67 10/09/2015   BUN 13 10/09/2015   CO2 34* 10/09/2015   TSH 3.76 10/09/2015   INR 0.98 08/02/2013   HGBA1C 6.1 04/19/2015  above  review ASSESSMENT AND PLAN:  Discussed the following assessment and plan:  Visit for preventive health examination - reveiwed has donated blood in the last few years so hiv and hep c has screenig  Medication management  Asthma, unspecified asthma severity, uncomplicated  Morbid obesity with BMI of 40.0-44.9, adult ([place])  Essential hypertension - borderline goal continue andsend in readings vs add med   Low HDL (under 40)  Nocturnal asthma Counseled regarding healthy nutrition, exercise, sleep, injury prevention, calcium vit d and healthy weight . Weight loss strategies  Cont exercise  Patient Care Team: Burnis Medin, MD as PCP - General Patient Instructions  Continue lifestyle intervention healthy eating and exercise .   Track what you do . Intake exercise   As we discussed try stopping all processed carbs fore a month also . Healthy weigh t loss will help your health  risk . Get you gyne check and mammo.   Send in BP readings  My chart in 1-2 months to assess control .  cpx in a year  Or  51month if bp not controlled etc .   Health Maintenance, Female Adopting a healthy lifestyle and getting preventive care can go a long way to promote health and wellness. Talk with your health care provider about what schedule of regular examinations is right for you. This is a good chance for you to check in with your provider about disease prevention and staying healthy. In between checkups, there are plenty of things you can do on your own. Experts have done a lot of research about which lifestyle changes and preventive measures are most likely to keep you healthy. Ask your health care provider for more information. WEIGHT AND DIET  Eat a healthy diet  Be sure to include plenty of vegetables, fruits, low-fat dairy products, and lean protein.  Do not eat a lot of foods high in solid fats, added sugars, or salt.  Get regular exercise. This is one of the most important things you can do for your health.  Most adults should exercise for at least 150 minutes each week. The exercise should increase your heart rate and make you sweat (moderate-intensity exercise).  Most adults should also do strengthening exercises at least twice a week. This is in addition to the moderate-intensity exercise.  Maintain a healthy weight  Body mass index (BMI) is a measurement that can be used to identify possible weight problems. It estimates body fat based on height and weight. Your health care provider can help determine your BMI and help you achieve or maintain a healthy weight.  For females 238years of age and older:   A BMI below 18.5 is considered underweight.  A BMI of 18.5 to 24.9 is normal.  A BMI of 25 to 29.9 is considered overweight.  A BMI of 30 and above is considered obese.  Watch levels of cholesterol and blood lipids  You should start having your blood tested  for lipids and  cholesterol at 65 years of age, then have this test every 5 years.  You may need to have your cholesterol levels checked more often if:  Your lipid or cholesterol levels are high.  You are older than 65 years of age.  You are at high risk for heart disease.  CANCER SCREENING   Lung Cancer  Lung cancer screening is recommended for adults 73-35 years old who are at high risk for lung cancer because of a history of smoking.  A yearly low-dose CT scan of the lungs is recommended for people who:  Currently smoke.  Have quit within the past 15 years.  Have at least a 30-pack-year history of smoking. A pack year is smoking an average of one pack of cigarettes a day for 1 year.  Yearly screening should continue until it has been 15 years since you quit.  Yearly screening should stop if you develop a health problem that would prevent you from having lung cancer treatment.  Breast Cancer  Practice breast self-awareness. This means understanding how your breasts normally appear and feel.  It also means doing regular breast self-exams. Let your health care provider know about any changes, no matter how small.  If you are in your 20s or 30s, you should have a clinical breast exam (CBE) by a health care provider every 1-3 years as part of a regular health exam.  If you are 15 or older, have a CBE every year. Also consider having a breast X-ray (mammogram) every year.  If you have a family history of breast cancer, talk to your health care provider about genetic screening.  If you are at high risk for breast cancer, talk to your health care provider about having an MRI and a mammogram every year.  Breast cancer gene (BRCA) assessment is recommended for women who have family members with BRCA-related cancers. BRCA-related cancers include:  Breast.  Ovarian.  Tubal.  Peritoneal cancers.  Results of the assessment will determine the need for genetic counseling and  BRCA1 and BRCA2 testing. Cervical Cancer Your health care provider may recommend that you be screened regularly for cancer of the pelvic organs (ovaries, uterus, and vagina). This screening involves a pelvic examination, including checking for microscopic changes to the surface of your cervix (Pap test). You may be encouraged to have this screening done every 3 years, beginning at age 34.  For women ages 91-65, health care providers may recommend pelvic exams and Pap testing every 3 years, or they may recommend the Pap and pelvic exam, combined with testing for human papilloma virus (HPV), every 5 years. Some types of HPV increase your risk of cervical cancer. Testing for HPV may also be done on women of any age with unclear Pap test results.  Other health care providers may not recommend any screening for nonpregnant women who are considered low risk for pelvic cancer and who do not have symptoms. Ask your health care provider if a screening pelvic exam is right for you.  If you have had past treatment for cervical cancer or a condition that could lead to cancer, you need Pap tests and screening for cancer for at least 20 years after your treatment. If Pap tests have been discontinued, your risk factors (such as having a new sexual partner) need to be reassessed to determine if screening should resume. Some women have medical problems that increase the chance of getting cervical cancer. In these cases, your health care provider may recommend more frequent screening  and Pap tests. Colorectal Cancer  This type of cancer can be detected and often prevented.  Routine colorectal cancer screening usually begins at 64 years of age and continues through 65 years of age.  Your health care provider may recommend screening at an earlier age if you have risk factors for colon cancer.  Your health care provider may also recommend using home test kits to check for hidden blood in the stool.  A small camera at  the end of a tube can be used to examine your colon directly (sigmoidoscopy or colonoscopy). This is done to check for the earliest forms of colorectal cancer.  Routine screening usually begins at age 31.  Direct examination of the colon should be repeated every 5-10 years through 65 years of age. However, you may need to be screened more often if early forms of precancerous polyps or small growths are found. Skin Cancer  Check your skin from head to toe regularly.  Tell your health care provider about any new moles or changes in moles, especially if there is a change in a mole's shape or color.  Also tell your health care provider if you have a mole that is larger than the size of a pencil eraser.  Always use sunscreen. Apply sunscreen liberally and repeatedly throughout the day.  Protect yourself by wearing long sleeves, pants, a wide-brimmed hat, and sunglasses whenever you are outside. HEART DISEASE, DIABETES, AND HIGH BLOOD PRESSURE   High blood pressure causes heart disease and increases the risk of stroke. High blood pressure is more likely to develop in:  People who have blood pressure in the high end of the normal range (130-139/85-89 mm Hg).  People who are overweight or obese.  People who are African American.  If you are 71-44 years of age, have your blood pressure checked every 3-5 years. If you are 6 years of age or older, have your blood pressure checked every year. You should have your blood pressure measured twice--once when you are at a hospital or clinic, and once when you are not at a hospital or clinic. Record the average of the two measurements. To check your blood pressure when you are not at a hospital or clinic, you can use:  An automated blood pressure machine at a pharmacy.  A home blood pressure monitor.  If you are between 64 years and 71 years old, ask your health care provider if you should take aspirin to prevent strokes.  Have regular diabetes  screenings. This involves taking a blood sample to check your fasting blood sugar level.  If you are at a normal weight and have a low risk for diabetes, have this test once every three years after 65 years of age.  If you are overweight and have a high risk for diabetes, consider being tested at a younger age or more often. PREVENTING INFECTION  Hepatitis B  If you have a higher risk for hepatitis B, you should be screened for this virus. You are considered at high risk for hepatitis B if:  You were born in a country where hepatitis B is common. Ask your health care provider which countries are considered high risk.  Your parents were born in a high-risk country, and you have not been immunized against hepatitis B (hepatitis B vaccine).  You have HIV or AIDS.  You use needles to inject street drugs.  You live with someone who has hepatitis B.  You have had sex with someone who has  hepatitis B.  You get hemodialysis treatment.  You take certain medicines for conditions, including cancer, organ transplantation, and autoimmune conditions. Hepatitis C  Blood testing is recommended for:  Everyone born from [birth year] through 1965.  Anyone with known risk factors for hepatitis C. Sexually transmitted infections (STIs)  You should be screened for sexually transmitted infections (STIs) including gonorrhea and chlamydia if:  You are sexually active and are younger than 65 years of age.  You are older than 65 years of age and your health care provider tells you that you are at risk for this type of infection.  Your sexual activity has changed since you were last screened and you are at an increased risk for chlamydia or gonorrhea. Ask your health care provider if you are at risk.  If you do not have HIV, but are at risk, it may be recommended that you take a prescription medicine daily to prevent HIV infection. This is called pre-exposure prophylaxis (PrEP). You are considered at risk  if:  You are sexually active and do not regularly use condoms or know the HIV status of your partner(s).  You take drugs by injection.  You are sexually active with a partner who has HIV. Talk with your health care provider about whether you are at high risk of being infected with HIV. If you choose to begin PrEP, you should first be tested for HIV. You should then be tested every 3 months for as long as you are taking PrEP.  PREGNANCY   If you are premenopausal and you may become pregnant, ask your health care provider about preconception counseling.  If you may become pregnant, take 400 to 800 micrograms (mcg) of folic acid every day.  If you want to prevent pregnancy, talk to your health care provider about birth control (contraception). OSTEOPOROSIS AND MENOPAUSE   Osteoporosis is a disease in which the bones lose minerals and strength with aging. This can result in serious bone fractures. Your risk for osteoporosis can be identified using a bone density scan.  If you are 94 years of age or older, or if you are at risk for osteoporosis and fractures, ask your health care provider if you should be screened.  Ask your health care provider whether you should take a calcium or vitamin D supplement to lower your risk for osteoporosis.  Menopause may have certain physical symptoms and risks.  Hormone replacement therapy may reduce some of these symptoms and risks. Talk to your health care provider about whether hormone replacement therapy is right for you.  HOME CARE INSTRUCTIONS   Schedule regular health, dental, and eye exams.  Stay current with your immunizations.   Do not use any tobacco products including cigarettes, chewing tobacco, or electronic cigarettes.  If you are pregnant, do not drink alcohol.  If you are breastfeeding, limit how much and how often you drink alcohol.  Limit alcohol intake to no more than 1 drink per day for nonpregnant women. One drink equals 12  ounces of beer, 5 ounces of wine, or 1 ounces of hard liquor.  Do not use street drugs.  Do not share needles.  Ask your health care provider for help if you need support or information about quitting drugs.  Tell your health care provider if you often feel depressed.  Tell your health care provider if you have ever been abused or do not feel safe at home.   This information is not intended to replace advice given to you by  your health care provider. Make sure you discuss any questions you have with your health care provider.   Document Released: 09/30/2010 Document Revised: 04/07/2014 Document Reviewed: 02/16/2013 Elsevier Interactive Patient Education 2016 [place] K. Panosh M.D.

## 2015-10-16 ENCOUNTER — Ambulatory Visit (INDEPENDENT_AMBULATORY_CARE_PROVIDER_SITE_OTHER): Payer: BLUE CROSS/BLUE SHIELD | Admitting: Internal Medicine

## 2015-10-16 ENCOUNTER — Other Ambulatory Visit: Payer: Self-pay | Admitting: Gynecology

## 2015-10-16 ENCOUNTER — Encounter: Payer: Self-pay | Admitting: Internal Medicine

## 2015-10-16 VITALS — BP 140/78 | Temp 97.9°F | Ht 68.0 in | Wt 303.4 lb

## 2015-10-16 DIAGNOSIS — Z6841 Body Mass Index (BMI) 40.0 and over, adult: Secondary | ICD-10-CM

## 2015-10-16 DIAGNOSIS — Z0000 Encounter for general adult medical examination without abnormal findings: Secondary | ICD-10-CM | POA: Diagnosis not present

## 2015-10-16 DIAGNOSIS — J45909 Unspecified asthma, uncomplicated: Secondary | ICD-10-CM

## 2015-10-16 DIAGNOSIS — Z79899 Other long term (current) drug therapy: Secondary | ICD-10-CM

## 2015-10-16 DIAGNOSIS — E786 Lipoprotein deficiency: Secondary | ICD-10-CM

## 2015-10-16 DIAGNOSIS — Z1231 Encounter for screening mammogram for malignant neoplasm of breast: Secondary | ICD-10-CM

## 2015-10-16 DIAGNOSIS — J45998 Other asthma: Secondary | ICD-10-CM

## 2015-10-16 DIAGNOSIS — I10 Essential (primary) hypertension: Secondary | ICD-10-CM

## 2015-10-16 MED ORDER — FLUTICASONE-SALMETEROL 100-50 MCG/DOSE IN AEPB: INHALATION_SPRAY | RESPIRATORY_TRACT | Status: DC

## 2015-10-16 MED ORDER — LOSARTAN POTASSIUM 100 MG PO TABS: 100.0000 mg | ORAL_TABLET | Freq: Every day | ORAL | Status: DC

## 2015-10-16 NOTE — Patient Instructions (Addendum)
Continue lifestyle intervention healthy eating and exercise .   Track what you do . Intake exercise   As we discussed try stopping all processed carbs fore a month also . Healthy weigh t loss will help your health risk . Get you gyne check and mammo.   Send in BP readings  My chart in 1-2 months to assess control .  cpx in a year  Or  74month if bp not controlled etc .   Health Maintenance, Female Adopting a healthy lifestyle and getting preventive care can go a long way to promote health and wellness. Talk with your health care provider about what schedule of regular examinations is right for you. This is a good chance for you to check in with your provider about disease prevention and staying healthy. In between checkups, there are plenty of things you can do on your own. Experts have done a lot of research about which lifestyle changes and preventive measures are most likely to keep you healthy. Ask your health care provider for more information. WEIGHT AND DIET  Eat a healthy diet  Be sure to include plenty of vegetables, fruits, low-fat dairy products, and lean protein.  Do not eat a lot of foods high in solid fats, added sugars, or salt.  Get regular exercise. This is one of the most important things you can do for your health.  Most adults should exercise for at least 150 minutes each week. The exercise should increase your heart rate and make you sweat (moderate-intensity exercise).  Most adults should also do strengthening exercises at least twice a week. This is in addition to the moderate-intensity exercise.  Maintain a healthy weight  Body mass index (BMI) is a measurement that can be used to identify possible weight problems. It estimates body fat based on height and weight. Your health care provider can help determine your BMI and help you achieve or maintain a healthy weight.  For females 276years of age and older:   A BMI below 18.5 is considered underweight.  A BMI  of 18.5 to 24.9 is normal.  A BMI of 25 to 29.9 is considered overweight.  A BMI of 30 and above is considered obese.  Watch levels of cholesterol and blood lipids  You should start having your blood tested for lipids and cholesterol at 65years of age, then have this test every 5 years.  You may need to have your cholesterol levels checked more often if:  Your lipid or cholesterol levels are high.  You are older than 65years of age.  You are at high risk for heart disease.  CANCER SCREENING   Lung Cancer  Lung cancer screening is recommended for adults 524822years old who are at high risk for lung cancer because of a history of smoking.  A yearly low-dose CT scan of the lungs is recommended for people who:  Currently smoke.  Have quit within the past 15 years.  Have at least a 30-pack-year history of smoking. A pack year is smoking an average of one pack of cigarettes a day for 1 year.  Yearly screening should continue until it has been 15 years since you quit.  Yearly screening should stop if you develop a health problem that would prevent you from having lung cancer treatment.  Breast Cancer  Practice breast self-awareness. This means understanding how your breasts normally appear and feel.  It also means doing regular breast self-exams. Let your health care provider know about  any changes, no matter how small.  If you are in your 20s or 30s, you should have a clinical breast exam (CBE) by a health care provider every 1-3 years as part of a regular health exam.  If you are 90 or older, have a CBE every year. Also consider having a breast X-ray (mammogram) every year.  If you have a family history of breast cancer, talk to your health care provider about genetic screening.  If you are at high risk for breast cancer, talk to your health care provider about having an MRI and a mammogram every year.  Breast cancer gene (BRCA) assessment is recommended for women who  have family members with BRCA-related cancers. BRCA-related cancers include:  Breast.  Ovarian.  Tubal.  Peritoneal cancers.  Results of the assessment will determine the need for genetic counseling and BRCA1 and BRCA2 testing. Cervical Cancer Your health care provider may recommend that you be screened regularly for cancer of the pelvic organs (ovaries, uterus, and vagina). This screening involves a pelvic examination, including checking for microscopic changes to the surface of your cervix (Pap test). You may be encouraged to have this screening done every 3 years, beginning at age 47.  For women ages 4-65, health care providers may recommend pelvic exams and Pap testing every 3 years, or they may recommend the Pap and pelvic exam, combined with testing for human papilloma virus (HPV), every 5 years. Some types of HPV increase your risk of cervical cancer. Testing for HPV may also be done on women of any age with unclear Pap test results.  Other health care providers may not recommend any screening for nonpregnant women who are considered low risk for pelvic cancer and who do not have symptoms. Ask your health care provider if a screening pelvic exam is right for you.  If you have had past treatment for cervical cancer or a condition that could lead to cancer, you need Pap tests and screening for cancer for at least 20 years after your treatment. If Pap tests have been discontinued, your risk factors (such as having a new sexual partner) need to be reassessed to determine if screening should resume. Some women have medical problems that increase the chance of getting cervical cancer. In these cases, your health care provider may recommend more frequent screening and Pap tests. Colorectal Cancer  This type of cancer can be detected and often prevented.  Routine colorectal cancer screening usually begins at 65 years of age and continues through 65 years of age.  Your health care provider  may recommend screening at an earlier age if you have risk factors for colon cancer.  Your health care provider may also recommend using home test kits to check for hidden blood in the stool.  A small camera at the end of a tube can be used to examine your colon directly (sigmoidoscopy or colonoscopy). This is done to check for the earliest forms of colorectal cancer.  Routine screening usually begins at age 49.  Direct examination of the colon should be repeated every 5-10 years through 66 years of age. However, you may need to be screened more often if early forms of precancerous polyps or small growths are found. Skin Cancer  Check your skin from head to toe regularly.  Tell your health care provider about any new moles or changes in moles, especially if there is a change in a mole's shape or color.  Also tell your health care provider if you have  a mole that is larger than the size of a pencil eraser.  Always use sunscreen. Apply sunscreen liberally and repeatedly throughout the day.  Protect yourself by wearing long sleeves, pants, a wide-brimmed hat, and sunglasses whenever you are outside. HEART DISEASE, DIABETES, AND HIGH BLOOD PRESSURE   High blood pressure causes heart disease and increases the risk of stroke. High blood pressure is more likely to develop in:  People who have blood pressure in the high end of the normal range (130-139/85-89 mm Hg).  People who are overweight or obese.  People who are African American.  If you are 64-66 years of age, have your blood pressure checked every 3-5 years. If you are 35 years of age or older, have your blood pressure checked every year. You should have your blood pressure measured twice--once when you are at a hospital or clinic, and once when you are not at a hospital or clinic. Record the average of the two measurements. To check your blood pressure when you are not at a hospital or clinic, you can use:  An automated blood  pressure machine at a pharmacy.  A home blood pressure monitor.  If you are between 78 years and 58 years old, ask your health care provider if you should take aspirin to prevent strokes.  Have regular diabetes screenings. This involves taking a blood sample to check your fasting blood sugar level.  If you are at a normal weight and have a low risk for diabetes, have this test once every three years after 65 years of age.  If you are overweight and have a high risk for diabetes, consider being tested at a younger age or more often. PREVENTING INFECTION  Hepatitis B  If you have a higher risk for hepatitis B, you should be screened for this virus. You are considered at high risk for hepatitis B if:  You were born in a country where hepatitis B is common. Ask your health care provider which countries are considered high risk.  Your parents were born in a high-risk country, and you have not been immunized against hepatitis B (hepatitis B vaccine).  You have HIV or AIDS.  You use needles to inject street drugs.  You live with someone who has hepatitis B.  You have had sex with someone who has hepatitis B.  You get hemodialysis treatment.  You take certain medicines for conditions, including cancer, organ transplantation, and autoimmune conditions. Hepatitis C  Blood testing is recommended for:  Everyone born from [birth year] through 1965.  Anyone with known risk factors for hepatitis C. Sexually transmitted infections (STIs)  You should be screened for sexually transmitted infections (STIs) including gonorrhea and chlamydia if:  You are sexually active and are younger than 65 years of age.  You are older than 65 years of age and your health care provider tells you that you are at risk for this type of infection.  Your sexual activity has changed since you were last screened and you are at an increased risk for chlamydia or gonorrhea. Ask your health care provider if you are at  risk.  If you do not have HIV, but are at risk, it may be recommended that you take a prescription medicine daily to prevent HIV infection. This is called pre-exposure prophylaxis (PrEP). You are considered at risk if:  You are sexually active and do not regularly use condoms or know the HIV status of your partner(s).  You take drugs by injection.  You  are sexually active with a partner who has HIV. Talk with your health care provider about whether you are at high risk of being infected with HIV. If you choose to begin PrEP, you should first be tested for HIV. You should then be tested every 3 months for as long as you are taking PrEP.  PREGNANCY   If you are premenopausal and you may become pregnant, ask your health care provider about preconception counseling.  If you may become pregnant, take 400 to 800 micrograms (mcg) of folic acid every day.  If you want to prevent pregnancy, talk to your health care provider about birth control (contraception). OSTEOPOROSIS AND MENOPAUSE   Osteoporosis is a disease in which the bones lose minerals and strength with aging. This can result in serious bone fractures. Your risk for osteoporosis can be identified using a bone density scan.  If you are 19 years of age or older, or if you are at risk for osteoporosis and fractures, ask your health care provider if you should be screened.  Ask your health care provider whether you should take a calcium or vitamin D supplement to lower your risk for osteoporosis.  Menopause may have certain physical symptoms and risks.  Hormone replacement therapy may reduce some of these symptoms and risks. Talk to your health care provider about whether hormone replacement therapy is right for you.  HOME CARE INSTRUCTIONS   Schedule regular health, dental, and eye exams.  Stay current with your immunizations.   Do not use any tobacco products including cigarettes, chewing tobacco, or electronic cigarettes.  If  you are pregnant, do not drink alcohol.  If you are breastfeeding, limit how much and how often you drink alcohol.  Limit alcohol intake to no more than 1 drink per day for nonpregnant women. One drink equals 12 ounces of beer, 5 ounces of wine, or 1 ounces of hard liquor.  Do not use street drugs.  Do not share needles.  Ask your health care provider for help if you need support or information about quitting drugs.  Tell your health care provider if you often feel depressed.  Tell your health care provider if you have ever been abused or do not feel safe at home.   This information is not intended to replace advice given to you by your health care provider. Make sure you discuss any questions you have with your health care provider.   Document Released: 09/30/2010 Document Revised: 04/07/2014 Document Reviewed: 02/16/2013 Elsevier Interactive Patient Education [employer].

## 2015-10-25 ENCOUNTER — Ambulatory Visit
Admission: RE | Admit: 2015-10-25 | Discharge: 2015-10-25 | Disposition: A | Discharge status: Home / Self Care | Payer: BLUE CROSS/BLUE SHIELD | Source: Ambulatory Visit | Attending: Gynecology | Admitting: Gynecology

## 2015-10-25 DIAGNOSIS — Z1231 Encounter for screening mammogram for malignant neoplasm of breast: Secondary | ICD-10-CM

## 2015-10-31 ENCOUNTER — Ambulatory Visit
Admission: RE | Admit: 2015-10-31 | Discharge: 2015-10-31 | Disposition: A | Discharge status: Home / Self Care | Payer: BLUE CROSS/BLUE SHIELD | Source: Ambulatory Visit | Attending: Endocrinology | Admitting: Endocrinology

## 2015-10-31 DIAGNOSIS — E049 Nontoxic goiter, unspecified: Secondary | ICD-10-CM

## 2015-11-02 ENCOUNTER — Encounter: Payer: Self-pay | Admitting: Pulmonary Disease

## 2015-11-02 ENCOUNTER — Ambulatory Visit (INDEPENDENT_AMBULATORY_CARE_PROVIDER_SITE_OTHER): Payer: BLUE CROSS/BLUE SHIELD | Admitting: Pulmonary Disease

## 2015-11-02 DIAGNOSIS — J45909 Unspecified asthma, uncomplicated: Secondary | ICD-10-CM

## 2015-11-02 DIAGNOSIS — G4733 Obstructive sleep apnea (adult) (pediatric): Secondary | ICD-10-CM

## 2015-11-02 NOTE — Progress Notes (Signed)
Subjective:    Patient ID: Amy Stokes, female    DOB: 11/18/1950, 65 y.o.   MRN: [id number]  HPI  Pt is being seen for OSA.  ROV 08/09/2015 Pt returns to clinic as f/u on her OSA. Last seen in 01/2014. She ended up having an oral device by Dr. Ron Parker x 1 yr. She has had studies after her oral device. Pt uses oral device and is still snoring. Still has hypersomnia despite oral device. HCO3 remains high. Asthma has been stable. Uses advair. Dxed with hypothyroidism recently.   ROV 11/02/15 Patient returns to the office as follow-up on her sleep apnea. Since last seen, she was started on auto CPAP. Tolerating CPAP well. No issues. Feels benefit of CPAP. Download from May until now shows 90% compliance, AHI of 2.4. She stopped using her oral device. Pt ended up purchasing cpap out of pocket as it was cheaper.   Review of Systems  Constitutional: Negative.   HENT: Negative.   Eyes: Negative.   Respiratory: Positive for cough and shortness of breath.   Gastrointestinal: Negative.   Endocrine: Negative.   Genitourinary: Negative.   Musculoskeletal: Negative.   Allergic/Immunologic: Negative.   Neurological: Negative.   Hematological: Negative.   Psychiatric/Behavioral: Negative.   All other systems reviewed and are negative.      Objective:   Physical Exam  Vitals:  Vitals:   11/02/15 1000  BP: 132/78  Pulse: 68  SpO2: 93%  Weight: (!) 302 lb (137 kg)  Height: 5\' 8"  (1.727 m)    Constitutional/General:  Pleasant, well-nourished, well-developed, not in any distress,  Comfortably seating.  Well kempt  Body mass index is 45.92 kg/m. Wt Readings from Last 3 Encounters:  11/02/15 (!) 302 lb (137 kg)  10/16/15 (!) 303 lb 6.4 oz (137.6 kg)  08/09/15 (!) 307 lb (139.3 kg)     HEENT: Pupils equal and reactive to light and accommodation. Anicteric sclerae. Normal nasal mucosa.   No oral  lesions,  mouth clear,  oropharynx clear, no postnasal drip. (-) Oral thrush. No dental  caries.  Airway - Mallampati class IV  Neck: No masses. Midline trachea. No JVD, (-) LAD. (-) bruits appreciated.  Respiratory/Chest: Grossly normal chest. (-) deformity. (-) Accessory muscle use.  Symmetric expansion. (-) Tenderness on palpation.  Resonant on percussion.  Diminished BS on both lower lung zones. (-), crackles, rhonchi  Some wheezing (-) egophony  Cardiovascular: Regular rate and  rhythm, heart sounds normal, no murmur or gallops, no peripheral edema  Gastrointestinal:  Normal bowel sounds. Soft, non-tender. No hepatosplenomegaly.  (-) masses.   Musculoskeletal:  Normal muscle tone. Normal gait.   Extremities: Grossly normal. (-) clubbing, cyanosis.  (-) edema  Skin: (-) rash,lesions seen.   Neurological/Psychiatric : alert, oriented to time, place, person. Normal mood and affect            Assessment & Plan:  OSA (obstructive sleep apnea) HST (01/2014)  AHI 17.   Has hypersomnia which persist despite using oral device. Was seeing Dr. Ron Parker. Pt very symptomatic and has snoring. Husband has OSA and uses cpap and he feels better.  Switched to autocpap 07/2015 and she is significantly improved.  She ended up paying for her CPAP since it was cheaper. She has a full face mask. Feels better using it, or energy, less sleepiness. He feels benefit of CPAP. Download the last 3 months: AHI 2. 90% compliance.  Plan : We extensively discussed the importance of treating OSA and  the need to use PAP therapy.   Continue with auto CPAP 4-20 cm water. She is tolerating the pressures. As mentioned, she ended up paying for the CPAP out of her own pocket. Switching to Medicare come December 2017. She also gets supplies through the Internet.   Patient was instructed to have mask, tubings, filter, reservoir cleaned at least once a week with soapy water.  Patient was instructed to call the office if he/she is having issues with the PAP device.    I advised patient to  obtain sufficient amount of sleep --  7 to 8 hours at least in a 24 hr period.  Patient was advised to follow good sleep hygiene.  Patient was advised NOT to engage in activities requiring concentration and/or vigilance if he/she is and  sleepy.  Patient is NOT to drive if he/she is sleepy.   Asthma Stable. Some wheeze. Cont advair 100/50 BID. Alb prn.   MORBID OBESITY Weight reduction.      Patient will follow up with me in 1 year.     Monica Becton, MD 11/02/2015   10:27 AM [facility] [place] Pager: [phone] Office: [phone], Fax: [phone]

## 2015-11-02 NOTE — Assessment & Plan Note (Signed)
Weight reduction 

## 2015-11-02 NOTE — Assessment & Plan Note (Signed)
HST (01/2014)  AHI 17.   Has hypersomnia which persist despite using oral device. Was seeing Dr. Ron Parker. Pt very symptomatic and has snoring. Husband has OSA and uses cpap and he feels better.  Switched to autocpap 07/2015 and she is significantly improved.  She ended up paying for her CPAP since it was cheaper. She has a full face mask. Feels better using it, or energy, less sleepiness. He feels benefit of CPAP. Download the last 3 months: AHI 2. 90% compliance.  Plan : We extensively discussed the importance of treating OSA and the need to use PAP therapy.   Continue with auto CPAP 4-20 cm water. She is tolerating the pressures. As mentioned, she ended up paying for the CPAP out of her own pocket. Switching to Medicare come December 2017. She also gets supplies through the Internet.   Patient was instructed to have mask, tubings, filter, reservoir cleaned at least once a week with soapy water.  Patient was instructed to call the office if he/she is having issues with the PAP device.    I advised patient to obtain sufficient amount of sleep --  7 to 8 hours at least in a 24 hr period.  Patient was advised to follow good sleep hygiene.  Patient was advised NOT to engage in activities requiring concentration and/or vigilance if he/she is and  sleepy.  Patient is NOT to drive if he/she is sleepy.

## 2015-11-02 NOTE — Assessment & Plan Note (Signed)
Stable. Some wheeze. Cont advair 100/50 BID. Alb prn.

## 2015-11-02 NOTE — Patient Instructions (Signed)
  It was a pleasure taking care of you today!  Continue using your CPAP machine.   Please make sure you use your CPAP device everytime you sleep.  We will monitor the usage of your machine per your insurance requirement.  Your insurance company may take the machine from you if you are not using it regularly.   Please clean the mask, tubings, filter, water reservoir with soapy water every week.  Please use distilled water for the water reservoir.   Please call the office or your machine provider (DME company) if you are having issues with the device.   Return to clinic in 1 year    

## 2015-11-07 ENCOUNTER — Other Ambulatory Visit: Payer: Self-pay | Admitting: Endocrinology

## 2015-11-07 DIAGNOSIS — E042 Nontoxic multinodular goiter: Secondary | ICD-10-CM

## 2015-11-14 ENCOUNTER — Other Ambulatory Visit: Payer: Self-pay | Admitting: Gynecology

## 2015-11-14 ENCOUNTER — Ambulatory Visit (INDEPENDENT_AMBULATORY_CARE_PROVIDER_SITE_OTHER): Payer: BLUE CROSS/BLUE SHIELD | Admitting: Gynecology

## 2015-11-14 ENCOUNTER — Encounter: Payer: Self-pay | Admitting: Gynecology

## 2015-11-14 VITALS — BP 130/78 | Ht 68.0 in | Wt 300.0 lb

## 2015-11-14 DIAGNOSIS — Z780 Asymptomatic menopausal state: Secondary | ICD-10-CM | POA: Diagnosis not present

## 2015-11-14 DIAGNOSIS — Z01419 Encounter for gynecological examination (general) (routine) without abnormal findings: Secondary | ICD-10-CM

## 2015-11-14 DIAGNOSIS — L292 Pruritus vulvae: Secondary | ICD-10-CM

## 2015-11-14 DIAGNOSIS — N898 Other specified noninflammatory disorders of vagina: Secondary | ICD-10-CM | POA: Diagnosis not present

## 2015-11-14 LAB — WET PREP FOR TRICH, YEAST, CLUE
Clue Cells Wet Prep HPF POC: NONE SEEN
Trich, Wet Prep: NONE SEEN

## 2015-11-14 MED ORDER — CLOBETASOL PROPIONATE 0.05 % EX CREA: TOPICAL_CREAM | CUTANEOUS | 2 refills | Status: DC

## 2015-11-14 MED ORDER — FLUCONAZOLE 150 MG PO TABS: 150.0000 mg | ORAL_TABLET | Freq: Once | ORAL | 3 refills | Status: AC

## 2015-11-14 MED ORDER — CLOBETASOL PROPIONATE 0.05 % EX CREA: 1.0000 "application " | TOPICAL_CREAM | Freq: Two times a day (BID) | CUTANEOUS | 2 refills | Status: DC

## 2015-11-14 NOTE — Patient Instructions (Addendum)
Clobetasol Propionate skin cream What is this medicine? CLOBETASOL (kloe BAY ta sol) is a corticosteroid. It is used on the skin to treat itching, redness, and swelling caused by some skin conditions. This medicine may be used for other purposes; ask your health care provider or pharmacist if you have questions. What should I tell my health care provider before I take this medicine? They need to know if you have any of these conditions: -any type of active infection including measles, tuberculosis, herpes, or chickenpox -circulation problems or vascular disease -large areas of burned or damaged skin -rosacea -skin wasting or thinning -an unusual or allergic reaction to clobetasol, corticosteroids, other medicines, foods, dyes, or preservatives -pregnant or trying to get pregnant -breast-feeding How should I use this medicine? This medicine is for external use only. Do not take by mouth. Follow the directions on the prescription label. Wash your hands before and after use. Apply a thin film of medicine to the affected area. Do not cover with a bandage or dressing unless your doctor or health care professional tells you to. Do not get this medicine in your eyes. If you do, rinse out with plenty of cool tap water. It is important not to use more medicine than prescribed. Do not use your medicine more often than directed. To do so may increase the chance of side effects. Talk to your pediatrician regarding the use of this medicine in children. Special care may be needed. Elderly patients are more likely to have damaged skin through aging, and this may increase side effects. This medicine should only be used for brief periods and infrequently in older patients. Overdosage: If you think you have taken too much of this medicine contact a poison control center or emergency room at once. NOTE: This medicine is only for you. Do not share this medicine with others. What if I miss a dose? If you miss a  dose, use it as soon as you can. If it is almost time for your next dose, use only that dose. Do not use double or extra doses. What may interact with this medicine? Interactions are not expected. Do not use cosmetics or other skin care products on the treated area. This list may not describe all possible interactions. Give your health care provider a list of all the medicines, herbs, non-prescription drugs, or dietary supplements you use. Also tell them if you smoke, drink alcohol, or use illegal drugs. Some items may interact with your medicine. What should I watch for while using this medicine? Tell your doctor or health care professional if your symptoms do not get better within 2 weeks, or if you develop skin irritation from the medicine. Tell your doctor or health care professional if you are exposed to anyone with measles or chickenpox, or if you develop sores or blisters that do not heal properly. What side effects may I notice from receiving this medicine? Side effects that you should report to your doctor or health care professional as soon as possible: -allergic reactions like skin rash, itching or hives, swelling of the face, lips, or tongue -changes in vision -lack of healing of the skin condition -painful, red, pus filled blisters on the skin or in hair follicles -thinning of the skin with easy bruising Side effects that usually do not require medical attention (report to your doctor or health care professional if they continue or are bothersome): -burning, irritation of the skin -redness or scaling of the skin This list may not describe all  possible side effects. Call your doctor for medical advice about side effects. You may report side effects to FDA at 1-800-FDA-1088. Where should I keep my medicine? Keep out of the reach of children. Store at room temperature between 15 and 30 degrees C (59 and 86 degrees F). Keep away from heat and direct light. Do not freeze. Throw away any  unused medicine after the expiration date. NOTE: This sheet is a summary. It may not cover all possible information. If you have questions about this medicine, talk to your doctor, pharmacist, or health care provider.    2016, Elsevier/Gold Standard. (2007-06-23 16:56:45)   Monilial Vaginitis Vaginitis in a soreness, swelling and redness (inflammation) of the vagina and vulva. Monilial vaginitis is not a sexually transmitted infection. CAUSES  Yeast vaginitis is caused by yeast (candida) that is normally found in your vagina. With a yeast infection, the candida has overgrown in number to a point that upsets the chemical balance. SYMPTOMS   White, thick vaginal discharge.  Swelling, itching, redness and irritation of the vagina and possibly the lips of the vagina (vulva).  Burning or painful urination.  Painful intercourse. DIAGNOSIS  Things that may contribute to monilial vaginitis are:  Postmenopausal and virginal states.  Pregnancy.  Infections.  Being tired, sick or stressed, especially if you had monilial vaginitis in the past.  Diabetes. Good control will help lower the chance.  Birth control pills.  Tight fitting garments.  Using bubble bath, feminine sprays, douches or deodorant tampons.  Taking certain medications that kill germs (antibiotics).  Sporadic recurrence can occur if you become ill. TREATMENT  Your caregiver will give you medication.  There are several kinds of anti monilial vaginal creams and suppositories specific for monilial vaginitis. For recurrent yeast infections, use a suppository or cream in the vagina 2 times a week, or as directed.  Anti-monilial or steroid cream for the itching or irritation of the vulva may also be used. Get your caregiver's permission.  Painting the vagina with methylene blue solution may help if the monilial cream does not work.  Eating yogurt may help prevent monilial vaginitis. HOME CARE INSTRUCTIONS   Finish  all medication as prescribed.  Do not have sex until treatment is completed or after your caregiver tells you it is okay.  Take warm sitz baths.  Do not douche.  Do not use tampons, especially scented ones.  Wear cotton underwear.  Avoid tight pants and panty hose.  Tell your sexual partner that you have a yeast infection. They should go to their caregiver if they have symptoms such as mild rash or itching.  Your sexual partner should be treated as well if your infection is difficult to eliminate.  Practice safer sex. Use condoms.  Some vaginal medications cause latex condoms to fail. Vaginal medications that harm condoms are:  Cleocin cream.  Butoconazole (Femstat).  Terconazole (Terazol) vaginal suppository.  Miconazole (Monistat) (may be purchased over the counter). SEEK MEDICAL CARE IF:   You have a temperature by mouth above 102 F (38.9 C).  The infection is getting worse after 2 days of treatment.  The infection is not getting better after 3 days of treatment.  You develop blisters in or around your vagina.  You develop vaginal bleeding, and it is not your menstrual period.  You have pain when you urinate.  You develop intestinal problems.  You have pain with sexual intercourse.   This information is not intended to replace advice given to you by your  health care provider. Make sure you discuss any questions you have with your health care provider.   Document Released: 12/25/2004 Document Revised: 06/09/2011 Document Reviewed: 09/18/2014 Elsevier Interactive Patient Education [employer].

## 2015-11-14 NOTE — Progress Notes (Signed)
Amy Stokes 1950-12-31 [id number]   History:    65 y.o.  for annual gyn exam with complaint of vulvar pruritus and irritation. In 2015 patient had a total abdominal hysterectomy with bilateral salpingo-oophorectomy as a result of postmenopausal bleeding endometrial hyperplasia and fibroid uterus. Concurrently she had an umbilical hernia repair by Dr. Saunders Glance general surgeon and has done well. Her PCP is Dr. Regis Bill and has been doing her blood work. Patient had a normal bone density study in 2016. Patient with no prior history of any abnormal Pap smear. Her colonoscopy was normal in 2009.  Past medical history,surgical history, family history and social history were all reviewed and documented in the EPIC chart.  Gynecologic History No LMP recorded. Patient has had a hysterectomy. Contraception: status post hysterectomy Last Pap: 2016. Results were: normal Last mammogram: 2017. Results were: Normal but dense had three-dimensional mammogram  Obstetric History OB History  Gravida Para Term Preterm AB Living  1 0     1 0  SAB TAB Ectopic Multiple Live Births  1            # Outcome Date GA Lbr Len/2nd Weight Sex Delivery Anes PTL Lv  1 SAB                ROS: A ROS was performed and pertinent positives and negatives are included in the history.  GENERAL: No fevers or chills. HEENT: No change in vision, no earache, sore throat or sinus congestion. NECK: No pain or stiffness. CARDIOVASCULAR: No chest pain or pressure. No palpitations. PULMONARY: No shortness of breath, cough or wheeze. GASTROINTESTINAL: No abdominal pain, nausea, vomiting or diarrhea, melena or bright red blood per rectum. GENITOURINARY: No urinary frequency, urgency, hesitancy or dysuria. MUSCULOSKELETAL: No joint or muscle pain, no back pain, no recent trauma. DERMATOLOGIC: No rash, no itching, no lesions. ENDOCRINE: No polyuria, polydipsia, no heat or cold intolerance. No recent change in weight.  HEMATOLOGICAL: No anemia or easy bruising or bleeding. NEUROLOGIC: No headache, seizures, numbness, tingling or weakness. PSYCHIATRIC: No depression, no loss of interest in normal activity or change in sleep pattern.     Exam: chaperone present  BP 130/78   Ht 5\' 8"  (1.727 m)   Wt 300 lb (136.1 kg)   BMI 45.61 kg/m   Body mass index is 45.61 kg/m.  General appearance : Well developed well nourished female. No acute distress HEENT: Eyes: no retinal hemorrhage or exudates,  Neck supple, trachea midline, no carotid bruits, no thyroidmegaly Lungs: Clear to auscultation, no rhonchi or wheezes, or rib retractions  Heart: Regular rate and rhythm, no murmurs or gallops Breast:Examined in sitting and supine position were symmetrical in appearance, no palpable masses or tenderness,  no skin retraction, no nipple inversion, no nipple discharge, no skin discoloration, no axillary or supraclavicular lymphadenopathy Abdomen: no palpable masses or tenderness, no rebound or guarding Extremities: no edema or skin discoloration or tenderness  Pelvic:  Bartholin, Urethra, Skene Glands: Within normal limits             Vagina: No gross lesions or discharge, white discharge noted  Cervix: No absent  Uterus  absent  Adnexa  Without masses or tenderness  Anus and perineum  normal   Rectovaginal  normal sphincter tone without palpated masses or tenderness             Hemoccult PCP provides   wet prep moderate yeast and many white blood cell moderate bacteria  Assessment/Plan:  65 y.o. female for annual exam with yeast vaginitis will be treated with Diflucan 150 mg 1 by mouth today. Because of her irritation external genitalia moist to prescribe clobetasol 0.05% to apply externally daily at bedtime for one week and then once a twice a week thereafter. Pap smear not indicated based on new guidelines. She is encouraged to continue to do monthly breast exams. We discussed importance of calcium vitamin D  and weightbearing exercises for osteoporosis prevention.  Additional 15 minutes was spent on diagnosing and treating yeast vaginitis and vulvar pruritus/dryness/irritation   Terrance Mass MD, 12:20 PM 11/14/2015

## 2015-11-16 ENCOUNTER — Encounter: Payer: Self-pay | Admitting: Gynecology

## 2015-11-16 ENCOUNTER — Encounter: Payer: Self-pay | Admitting: Pulmonary Disease

## 2015-11-16 ENCOUNTER — Telehealth: Payer: Self-pay | Admitting: *Deleted

## 2015-11-16 MED ORDER — NYSTATIN 100000 UNIT/GM EX CREA: 1.0000 "application " | TOPICAL_CREAM | Freq: Two times a day (BID) | CUTANEOUS | 2 refills | Status: DC

## 2015-11-16 MED ORDER — TRIAMCINOLONE ACETONIDE 0.1 % EX CREA: 1.0000 "application " | TOPICAL_CREAM | Freq: Two times a day (BID) | CUTANEOUS | 2 refills | Status: DC

## 2015-11-16 NOTE — Telephone Encounter (Signed)
Pt medication for clobetasol propionate 0.05 % cram is not covered by insurance, per Dr. Toney Rakes response to pt e-mail try mytrex cream. Which is nystatin and triamcinolone medication is typically expensive so I sent to separate Rx. Will relay to pt.

## 2015-11-21 ENCOUNTER — Other Ambulatory Visit (HOSPITAL_COMMUNITY)
Admission: RE | Admit: 2015-11-21 | Discharge: 2015-11-21 | Disposition: A | Discharge status: Home / Self Care | Payer: BLUE CROSS/BLUE SHIELD | Source: Ambulatory Visit | Attending: General Surgery | Admitting: General Surgery

## 2015-11-21 ENCOUNTER — Ambulatory Visit
Admission: RE | Admit: 2015-11-21 | Discharge: 2015-11-21 | Disposition: A | Discharge status: Home / Self Care | Payer: BLUE CROSS/BLUE SHIELD | Source: Ambulatory Visit | Attending: Endocrinology | Admitting: Endocrinology

## 2015-11-21 DIAGNOSIS — E041 Nontoxic single thyroid nodule: Secondary | ICD-10-CM | POA: Insufficient documentation

## 2015-11-21 DIAGNOSIS — E042 Nontoxic multinodular goiter: Secondary | ICD-10-CM

## 2016-04-02 NOTE — Progress Notes (Signed)
Pre visit review using our clinic review tool, if applicable. No additional management support is needed unless otherwise documented below in the visit note.  Chief Complaint  Patient presents with  . Burning Sensation in Stomach    Started in Dec.    HPI: Amy Stokes 66 y.o.  sda  Hx osa   Co of 1 month of above sx . Epigastric burning waxing and wanign and now more considtant  Had been taking asa  And backed off and improved this     Irritation from onions  And adapted diet  . Shad come and gone  And then worse over  The past month  Last pm worse . Antacids x 1 no help.  Off an on minimal for a year.  Not nocturnal .after eating epigastric area     Right after  Eating .   Lasts  Ongoing all day  At times .  Red onions .   No v d blood . No unintentional weight  Loss.    NO NOcturnal sx and no bowel habit changes   Or illness new meds etc  ROS: See pertinent positives and negatives per HPI. No cp sob ocass hb   Past Medical History:  Diagnosis Date  . Asthma   . Edema   . Fibroids   . Hyperlipidemia   . Hypertension   . Obesity   . Thyroid nodule 01/2009   left discovered on chest ct; needle bx "indeterminant" cannon r/o follicular neoplasm     Family History  Problem Relation Age of Onset  . Arthritis Mother   . Other Mother     neurological disorder  . Pancreatic cancer Mother   . Heart disease Father   . Thyroid nodules Sister   . Heart disease Brother     x2 1 brother deceased    Social History   Social History  . Marital status: Married    Spouse name: N/A  . Number of children: 0  . Years of education: N/A   Occupational History  . retired    Social History Main Topics  . Smoking status: Former Smoker    Packs/day: 1.00    Years: 1.00    Types: Cigarettes    Quit date: 04/29/1971  . Smokeless tobacco: Never Used  . Alcohol use 0.0 oz/week     Comment: occ  . Drug use: No  . Sexual activity: No   Other Topics Concern  . None   Social  History Narrative   Married   Occupation: Chiropractor currently unemployed   Married   Regular exercise-no   [place] of 2   Pet cat          Outpatient Medications Prior to Visit  Medication Sig Dispense Refill  . albuterol (PROAIR HFA) 108 (90 BASE) MCG/ACT inhaler Inhale 2 puffs into the lungs every 6 (six) hours as needed for wheezing. 1 Inhaler 2  . b complex vitamins tablet Take 1 tablet by mouth daily.    . Fluticasone-Salmeterol (ADVAIR DISKUS) 100-50 MCG/DOSE AEPB INHALE 1 PUFF INTO THE LUNGS TWICE A DAY OR AS DIRECTED 180 each 3  . levothyroxine (SYNTHROID, LEVOTHROID) 50 MCG tablet Take 1 tablet by mouth daily.  0  . loratadine (CLARITIN) 10 MG tablet Take 10 mg by mouth daily.    [name] losartan (COZAAR) 100 MG tablet Take 1 tablet (100 mg total) by mouth daily. 90 tablet 3  . MULTIPLE VITAMIN PO Take 1 tablet by mouth  2 (two) times daily. Vitafusion.    . Probiotic Product (PROBIOTIC DAILY PO) Take 1 tablet by mouth 2 (two) times daily. Insync    . aspirin 81 MG tablet Take 81 mg by mouth daily. Reported on 10/16/2015    . Cholecalciferol (VITAMIN D3) 3000 units TABS Take 1 tablet by mouth daily.    [name] nystatin cream (MYCOSTATIN) Apply 1 application topically 2 (two) times daily. 30 g 2  . triamcinolone cream (KENALOG) 0.1 % Apply 1 application topically 2 (two) times daily. 30 g 2   No facility-administered medications prior to visit.      EXAM:  BP 124/66 (BP Location: Right Arm, Patient Position: Sitting, Cuff Size: Large)   Temp 97.9 F (36.6 C) (Oral)   Ht 5\' 8"  (1.727 m)   Wt (!) 304 lb (137.9 kg)   BMI 46.22 kg/m   Body mass index is 46.22 kg/m.  GENERAL: vitals reviewed and listed above, alert, oriented, appears well hydrated and in no acute distress HEENT: atraumatic, conjunctiva  clear, no obvious abnormalities on inspection of external nose and ears  NECK: no obvious masses on inspection palpation  LUNGS: clear to auscultation bilaterally, no wheezes,  rales or rhonchi, good air movement CV: HRRR, no clubbing cyanosis or  peripheral edema nl cap refill  Abdomen:  Sof,t normal bowel sounds without obvious hepatosplenomegaly, no guarding rebound or masses no CVA tenderness  Large abd limits sensitivity of exam .  MS: moves all extremities without noticeable focal  abnormality PSYCH: pleasant and cooperative, no obvious depression or anxiety Lab Results  Component Value Date   WBC 7.3 10/09/2015   HGB 13.0 10/09/2015   HCT 39.2 10/09/2015   PLT 239.0 10/09/2015   GLUCOSE 99 10/09/2015   CHOL 196 10/09/2015   TRIG 68.0 10/09/2015   HDL 39.30 10/09/2015   LDLDIRECT 141.7 04/03/2011   LDLCALC 143 (H) 10/09/2015   ALT 19 10/09/2015   AST 16 10/09/2015   NA 138 10/09/2015   K 3.6 10/09/2015   CL 100 10/09/2015   CREATININE 0.67 10/09/2015   BUN 13 10/09/2015   CO2 34 (H) 10/09/2015   TSH 3.76 10/09/2015   INR 0.98 08/02/2013   HGBA1C 6.1 04/19/2015    ASSESSMENT AND PLAN:  Discussed the following assessment and plan:  Epigastric burning sensation  Need for vaccination with 13-polyvalent pneumococcal conjugate vaccine - Plan: Pneumococcal conjugate vaccine 13-valent  -Patient advised to return or notify health care team  if symptoms worsen ,persist or new concerns arise.  Patient Instructions  Symptoms gastritis and we will give you a treatment with a mild acid blocker. Continue to avoid aspirin and Advil Aleve products. Begin ranitidine 150 mg twice a day for the next month. Follow-up visit in a month. If things are not improving we may step off to stronger acid blockers and do more evaluation.  prevnar 13 today       Wanda K. Panosh M.D.

## 2016-04-03 ENCOUNTER — Encounter: Payer: Self-pay | Admitting: Internal Medicine

## 2016-04-03 ENCOUNTER — Ambulatory Visit (INDEPENDENT_AMBULATORY_CARE_PROVIDER_SITE_OTHER): Payer: Medicare HMO | Admitting: Internal Medicine

## 2016-04-03 VITALS — BP 124/66 | Temp 97.9°F | Ht 68.0 in | Wt 304.0 lb

## 2016-04-03 DIAGNOSIS — R1013 Epigastric pain: Secondary | ICD-10-CM | POA: Diagnosis not present

## 2016-04-03 DIAGNOSIS — Z23 Encounter for immunization: Secondary | ICD-10-CM

## 2016-04-03 MED ORDER — RANITIDINE HCL 150 MG PO TABS: 150.0000 mg | ORAL_TABLET | Freq: Two times a day (BID) | ORAL | 1 refills | Status: DC

## 2016-04-03 NOTE — Patient Instructions (Addendum)
Symptoms gastritis and we will give you a treatment with a mild acid blocker. Continue to avoid aspirin and Advil Aleve products. Begin ranitidine 150 mg twice a day for the next month. Follow-up visit in a month. If things are not improving we may step off to stronger acid blockers and do more evaluation.  prevnar 13 today

## 2016-04-19 DIAGNOSIS — J189 Pneumonia, unspecified organism: Secondary | ICD-10-CM | POA: Diagnosis not present

## 2016-04-22 ENCOUNTER — Telehealth: Payer: Self-pay | Admitting: Internal Medicine

## 2016-04-22 NOTE — Telephone Encounter (Signed)
Pt was seen at [facility] Saturday. She had cxr that was positive for pna in [facility]. She was given Levaquin, 750mg . She reports continued low grade fever, highest 100.2 oral. She states that she does feel she is improving. She c/o occasional wheeze at night, she has been using albuterol HFA for symptoms and it does help some. She states that she has been taking Tylenol for the fever and it does help. She has one more dose of abx. Advised pt to contact office if fever continued past last dose of abx or gets any higher. Pt voiced understanding. Nothing further needed at this time.

## 2016-04-22 NOTE — Telephone Encounter (Signed)
[place]  Patient Name: Amy Stokes  DOB: 05-10-1950    Initial Comment Caller states she was diagnosed with pneumonia Sat, on antibiotics but still has fever   Nurse Assessment  Nurse: Harlow Mares, RN, Suanne Marker Date/Time (Eastern Time): 04/22/2016 1:45:40 PM  Confirm and document reason for call. If symptomatic, describe symptoms. ---Caller states she was diagnosed with pneumonia Sat, on antibiotics but still has fever (99.0). Was 100.2 with tylenol last night. Reports that her symptoms are improving. oral temp.  Does the patient have any new or worsening symptoms? ---Yes  Will a triage be completed? ---Yes  Related visit to physician within the last 2 weeks? ---Yes  Does the PT have any chronic conditions? (i.e. diabetes, asthma, etc.) ---Yes  List chronic conditions. ---HTN; asthma  Is this a behavioral health or substance abuse call? ---No     Guidelines    Guideline Title Affirmed Question Affirmed Notes  Pneumonia on Antibiotic Post-Hospitalization Follow-up Call [1] Taking antibiotics < 5 days after hospital discharge for pneumonia AND [2] symptoms improved (feels better) (all other triage questions negative)    Final Disposition User   [place], RN, Suanne Marker    Disagree/Comply: Leta Baptist

## 2016-05-02 NOTE — Progress Notes (Signed)
Pre visit review using our clinic review tool, if applicable. No additional management support is needed unless otherwise documented below in the visit note.  Chief Complaint  Patient presents with  . Follow-up    HPI: Amy Stokes 66 y.o.  Fu epigastric burning but in the interim   Dx w poss rll pna  And place on levaquine   Dx with lrlpna on levaquin    Call 1 23    rx  1 23  Had been sick a few day s.   Had low grade fever   Cough is now  Gone.  Now   ocass albuterol. Since then a lot better  .  GI  When forgot pills  felt difference   Some days not noteicing  And other daysa notices  .  Not sure if  Helping but may be and not worse  No vomiting fever  Weighr loss unintentional   ROS: See pertinent positives and negatives per HPI.  Past Medical History:  Diagnosis Date  . Asthma   . Edema   . Fibroids   . Hyperlipidemia   . Hypertension   . Obesity   . Thyroid nodule 01/2009   left discovered on chest ct; needle bx "indeterminant" cannon r/o follicular neoplasm     Family History  Problem Relation Age of Onset  . Arthritis Mother   . Other Mother     neurological disorder  . Pancreatic cancer Mother   . Heart disease Father   . Thyroid nodules Sister   . Heart disease Brother     x2 1 brother deceased    Social History   Social History  . Marital status: Married    Spouse name: N/A  . Number of children: 0  . Years of education: N/A   Occupational History  . retired    Social History Main Topics  . Smoking status: Former Smoker    Packs/day: 1.00    Years: 1.00    Types: Cigarettes    Quit date: 04/29/1971  . Smokeless tobacco: Never Used  . Alcohol use 0.0 oz/week     Comment: occ  . Drug use: No  . Sexual activity: No   Other Topics Concern  . None   Social History Narrative   Married   Occupation: Chiropractor currently unemployed   Married   Regular exercise-no   [place] of 2   Pet cat          Outpatient Medications Prior to  Visit  Medication Sig Dispense Refill  . b complex vitamins tablet Take 1 tablet by mouth daily.    . Cholecalciferol 4000 units CAPS Take by mouth. PATIENT USING OIL AND NOT CAPSULES    . Fluticasone-Salmeterol (ADVAIR DISKUS) 100-50 MCG/DOSE AEPB INHALE 1 PUFF INTO THE LUNGS TWICE A DAY OR AS DIRECTED 180 each 3  . levothyroxine (SYNTHROID, LEVOTHROID) 50 MCG tablet Take 1 tablet by mouth daily.  0  . loratadine (CLARITIN) 10 MG tablet Take 10 mg by mouth daily.    [name] losartan (COZAAR) 100 MG tablet Take 1 tablet (100 mg total) by mouth daily. 90 tablet 3  . MULTIPLE VITAMIN PO Take 1 tablet by mouth 2 (two) times daily. Vitafusion.    . Probiotic Product (PROBIOTIC DAILY PO) Take 1 tablet by mouth 2 (two) times daily. Insync    . ranitidine (ZANTAC) 150 MG tablet Take 1 tablet (150 mg total) by mouth 2 (two) times daily. 60 tablet 1  .  albuterol (PROAIR HFA) 108 (90 BASE) MCG/ACT inhaler Inhale 2 puffs into the lungs every 6 (six) hours as needed for wheezing. (Patient not taking: Reported on 05/05/2016) 1 Inhaler 2   No facility-administered medications prior to visit.      EXAM:  BP 140/70 (BP Location: Right Arm, Patient Position: Sitting, Cuff Size: Large)   Pulse 84   Temp 97.7 F (36.5 C) (Oral)   Ht 5\' 8"  (1.727 m)   Wt (!) 306 lb (138.8 kg)   SpO2 97%   BMI 46.53 kg/m   Body mass index is 46.53 kg/m.  GENERAL: vitals reviewed and listed above, alert, oriented, appears well hydrated and in no acute distress HEENT: atraumatic, conjunctiva  clear, no obvious abnormalities on inspection of external nose and ears OP : no lesion edema or exudate  NECK: no obvious masses on inspection palpation  LUNGS: clear to auscultation bilaterally, no wheezes, rales or rhonchi, good air movement CV: HRRR, no clubbing cyanosis or  peripheral edema nl cap refill  Abdomen:  Sof,t normal bowel sounds without hepatosplenomegaly, no guarding rebound or masses no CVA tenderness MS: moves all  extremities without noticeable focal  Abnormality non icteric  PSYCH: pleasant and cooperative, no obvious depression or anxiety Lab Results  Component Value Date   WBC 7.3 10/09/2015   HGB 13.0 10/09/2015   HCT 39.2 10/09/2015   PLT 239.0 10/09/2015   GLUCOSE 99 10/09/2015   CHOL 196 10/09/2015   TRIG 68.0 10/09/2015   HDL 39.30 10/09/2015   LDLDIRECT 141.7 04/03/2011   LDLCALC 143 (H) 10/09/2015   ALT 19 10/09/2015   AST 16 10/09/2015   NA 138 10/09/2015   K 3.6 10/09/2015   CL 100 10/09/2015   CREATININE 0.67 10/09/2015   BUN 13 10/09/2015   CO2 34 (H) 10/09/2015   TSH 3.76 10/09/2015   INR 0.98 08/02/2013   HGBA1C 6.1 04/19/2015   Reviewed  UC reports etc  To scan ASSESSMENT AND PLAN:  Discussed the following assessment and plan:  Pneumonia of right lower lobe due to infectious organism ([place]) - clincally resolved couldnt open cd to view  no need to fu x ray at this time  Epigastric burning sensation - ? if some better  PNA complicated assessment of response  Medication management Treatment in urgent care for pneumonia on Levaquin no x-ray or nodes in the system.    Had   Ranitidine  After disc   Plan follow up in another 6- 8 weeks     Far out form the pna rx  Total visit 18mins > 50% spent counseling and coordinating care as indicated in above note and in instructions to patient . -Patient advised to return or notify health care team  if symptoms worsen ,persist or new concerns arise.  Patient Instructions  No need for repeat x ray based on  Your exam   And sx .  Stay on the ranitidine   150 -   Twice a day and plan ROV in 6 weeks or so  Call for refill.  Or if gets worse.       [name]. Panosh M.D.

## 2016-05-05 ENCOUNTER — Encounter: Payer: Self-pay | Admitting: Internal Medicine

## 2016-05-05 ENCOUNTER — Ambulatory Visit (INDEPENDENT_AMBULATORY_CARE_PROVIDER_SITE_OTHER): Payer: Medicare HMO | Admitting: Internal Medicine

## 2016-05-05 VITALS — BP 140/70 | HR 84 | Temp 97.7°F | Ht 68.0 in | Wt 306.0 lb

## 2016-05-05 DIAGNOSIS — R1013 Epigastric pain: Secondary | ICD-10-CM

## 2016-05-05 DIAGNOSIS — J181 Lobar pneumonia, unspecified organism: Secondary | ICD-10-CM

## 2016-05-05 DIAGNOSIS — Z79899 Other long term (current) drug therapy: Secondary | ICD-10-CM | POA: Diagnosis not present

## 2016-05-05 DIAGNOSIS — J189 Pneumonia, unspecified organism: Secondary | ICD-10-CM

## 2016-05-05 NOTE — Patient Instructions (Addendum)
No need for repeat x ray based on  Your exam   And sx .  Stay on the ranitidine   150 -   Twice a day and plan ROV in 6 weeks or so  Call for refill.  Or if gets worse.

## 2016-06-02 ENCOUNTER — Other Ambulatory Visit: Payer: Self-pay | Admitting: Internal Medicine

## 2016-06-13 NOTE — Progress Notes (Signed)
Chief Complaint  Patient presents with  . Follow-up    HPI: Amy Stokes 66 y.o. come in for Follow-up of some epigastric burning treated with ranitidine twice a day. Since her last visit her stomach so basically resolved it is not on her radar taking ranitidine 150 mg twice a day Is losing weight trying to exercise etc. but did get some respiratory symptoms and 1 week ago thinks her pneumonia came back "" onset sneezing and coughing and developed a fever for 1-2 days of 100.2 her husband then developed similar symptoms Feeling congested in chest .   Tylenol rx still wheezy   But And  sleep better last night  .[name]  Fever  Lasted 2 days  Or so .   Now  Feels mild chest congestion and cough and wheezing  Used albuterol yesterday doing better     Patient Instructions  No need for repeat x ray based on  Your exam   And sx .  Stay on the ranitidine   150 -   Twice a day and plan ROV in 6 weeks or so  Call for refill.  Or if gets worse.   ROS: See pertinent positives and negatives per HPI. No hemoptysis  Sob now   Past Medical History:  Diagnosis Date  . Asthma   . Edema   . Fibroids   . Hyperlipidemia   . Hypertension   . Obesity   . Thyroid nodule 01/2009   left discovered on chest ct; needle bx "indeterminant" cannon r/o follicular neoplasm     Family History  Problem Relation Age of Onset  . Arthritis Mother   . Other Mother     neurological disorder  . Pancreatic cancer Mother   . Heart disease Father   . Thyroid nodules Sister   . Heart disease Brother     x2 1 brother deceased    Social History   Social History  . Marital status: Married    Spouse name: N/A  . Number of children: 0  . Years of education: N/A   Occupational History  . retired    Social History Main Topics  . Smoking status: Former Smoker    Packs/day: 1.00    Years: 1.00    Types: Cigarettes    Quit date: 04/29/1971  . Smokeless tobacco: Never Used  . Alcohol use 0.0 oz/week   Comment: occ  . Drug use: No  . Sexual activity: No   Other Topics Concern  . None   Social History Narrative   Married   Occupation: Chiropractor currently unemployed   Married   Regular exercise-no   [place] of 2   Pet cat          Outpatient Medications Prior to Visit  Medication Sig Dispense Refill  . albuterol (PROAIR HFA) 108 (90 BASE) MCG/ACT inhaler Inhale 2 puffs into the lungs every 6 (six) hours as needed for wheezing. 1 Inhaler 2  . b complex vitamins tablet Take 1 tablet by mouth daily.    . Cholecalciferol 4000 units CAPS Take by mouth. PATIENT USING OIL AND NOT CAPSULES    . Fluticasone-Salmeterol (ADVAIR DISKUS) 100-50 MCG/DOSE AEPB INHALE 1 PUFF INTO THE LUNGS TWICE A DAY OR AS DIRECTED 180 each 3  . levothyroxine (SYNTHROID, LEVOTHROID) 50 MCG tablet Take 1 tablet by mouth daily.  0  . loratadine (CLARITIN) 10 MG tablet Take 10 mg by mouth daily.    [name] losartan (COZAAR) 100 MG  tablet Take 1 tablet (100 mg total) by mouth daily. 90 tablet 3  . MULTIPLE VITAMIN PO Take 1 tablet by mouth 2 (two) times daily. Vitafusion.    . Probiotic Product (PROBIOTIC DAILY PO) Take 1 tablet by mouth 2 (two) times daily. Insync    . ranitidine (ZANTAC) 150 MG tablet take 1 tablet by mouth twice a day 60 tablet 1   No facility-administered medications prior to visit.      EXAM:  BP 140/70 (BP Location: Right Arm, Patient Position: Sitting, Cuff Size: Large)   Pulse 88   Temp 97.3 F (36.3 C) (Oral)   Ht 5\' 8"  (1.727 m)   Wt 298 lb 12.8 oz (135.5 kg)   SpO2 94%   BMI 45.43 kg/m   Body mass index is 45.43 kg/m.  GENERAL: vitals reviewed and listed above, alert, oriented, appears well hydrated and in no acute distress nl respiratinos  loks well  HEENT: atraumatic, conjunctiva  clear, no obvious abnormalities on inspection of external nose and ears tms clear   OP : no lesion edema or exudate  NECK: no obvious masses on inspection palpation  LUNGS: bs = rare tubular wheeze    Sound no retractions  Wheezy bronchial cough  CV: HRRR, no clubbing cyanosis or  peripheral edema nl cap refill  MS: moves all extremities without noticeable focal  abnormality PSYCH: pleasant and cooperative, no obvious depression or anxiety Lab Results  Component Value Date   WBC 7.3 10/09/2015   HGB 13.0 10/09/2015   HCT 39.2 10/09/2015   PLT 239.0 10/09/2015   GLUCOSE 99 10/09/2015   CHOL 196 10/09/2015   TRIG 68.0 10/09/2015   HDL 39.30 10/09/2015   LDLDIRECT 141.7 04/03/2011   LDLCALC 143 (H) 10/09/2015   ALT 19 10/09/2015   AST 16 10/09/2015   NA 138 10/09/2015   K 3.6 10/09/2015   CL 100 10/09/2015   CREATININE 0.67 10/09/2015   BUN 13 10/09/2015   CO2 34 (H) 10/09/2015   TSH 3.76 10/09/2015   INR 0.98 08/02/2013   HGBA1C 6.1 04/19/2015   BP Readings from Last 3 Encounters:  06/16/16 140/70  05/05/16 140/70  04/03/16 124/66   Wt Readings from Last 3 Encounters:  06/16/16 298 lb 12.8 oz (135.5 kg)  05/05/16 (!) 306 lb (138.8 kg)  04/03/16 (!) 304 lb (137.9 kg)     ASSESSMENT AND PLAN:  Discussed the following assessment and plan:  Epigastric burning sensation  Medication management  Viral upper respiratory tract infection with cough  Nocturnal asthma  History of pneumonia  Morbid obesity with BMI of 40.0-44.9, adult ([place]) - continue healthy weight loss Epigastric burning is better and stay on the ranitidine until cough resolved and then wean as tolerated if recurrence or alarm features plan follow-up and more evaluation.  I believe her respiratory infection is viral I could not open the disc with the image of the x-ray on it. Her symptoms sound viral and context. Since she is getting better would optimize her bronco dilator regimen and see how she does. Low threshold to get an chest x-ray certainly if fever recurs. Use her albuterol every 6-8 hours if increasing wheezing consider x-ray and prednisone etc.  Continue healthy weight loss. Total visit  89mins > 50% spent counseling and coordinating care as indicated in above note and in instructions to patient .   -Patient advised to return or notify health care team  if  new concerns arise.  Patient Instructions  I  think you have  a  viral respiratory infection that should run its course but could be   . Flaring your asthma mildly   Stay on the advair  And  For the next 2-3 days  Use albuterol rescue every 6-8 hours .  If fever or relapsing sx   contact us    Repeat cxray etc.    Can try off of the ranitidine  When  Cough  Is better and then as needed.   ROV if still having to use ranitidine on a regular basis   In 2 months       Wanda K. Panosh M.D.

## 2016-06-16 ENCOUNTER — Ambulatory Visit (INDEPENDENT_AMBULATORY_CARE_PROVIDER_SITE_OTHER): Payer: Medicare HMO | Admitting: Internal Medicine

## 2016-06-16 ENCOUNTER — Encounter: Payer: Self-pay | Admitting: Internal Medicine

## 2016-06-16 VITALS — BP 140/70 | HR 88 | Temp 97.3°F | Ht 68.0 in | Wt 298.8 lb

## 2016-06-16 DIAGNOSIS — J45998 Other asthma: Secondary | ICD-10-CM | POA: Diagnosis not present

## 2016-06-16 DIAGNOSIS — R1013 Epigastric pain: Secondary | ICD-10-CM | POA: Diagnosis not present

## 2016-06-16 DIAGNOSIS — Z6841 Body Mass Index (BMI) 40.0 and over, adult: Secondary | ICD-10-CM

## 2016-06-16 DIAGNOSIS — Z8701 Personal history of pneumonia (recurrent): Secondary | ICD-10-CM

## 2016-06-16 DIAGNOSIS — J069 Acute upper respiratory infection, unspecified: Secondary | ICD-10-CM | POA: Diagnosis not present

## 2016-06-16 DIAGNOSIS — Z79899 Other long term (current) drug therapy: Secondary | ICD-10-CM | POA: Diagnosis not present

## 2016-06-16 DIAGNOSIS — B9789 Other viral agents as the cause of diseases classified elsewhere: Secondary | ICD-10-CM

## 2016-06-16 NOTE — Patient Instructions (Addendum)
I think you have  a  viral respiratory infection that should run its course but could be   . Flaring your asthma mildly   Stay on the advair  And  For the next 2-3 days  Use albuterol rescue every 6-8 hours .  If fever or relapsing sx   contact us    Repeat cxray etc.    Can try off of the ranitidine  When  Cough  Is better and then as needed.   ROV if still having to use ranitidine on a regular basis   In 2 months

## 2016-07-22 ENCOUNTER — Encounter: Payer: Self-pay | Admitting: Internal Medicine

## 2016-08-13 ENCOUNTER — Encounter: Payer: Self-pay | Admitting: Gynecology

## 2016-10-06 ENCOUNTER — Other Ambulatory Visit: Payer: Medicare HMO

## 2016-10-16 NOTE — Progress Notes (Signed)
Chief Complaint  Patient presents with  . Annual Exam    HPI: Amy Stokes 66 y.o. comes in today for welcome  To Preventive Medicare exam/ wellness visit .Since last visit.    Colon due nnext year   Balan check thyroid nodule and med   Due in august   Blood pressure readings seem to be okay no side effects of medicine.   Not taking the Prilosec on a regular basis doesn't need it anymore.   Health Maintenance  Topic Date Due  . PAP SMEAR  09/29/2015  . Hepatitis C Screening  05/04/2017 (Originally 1950-05-23)  . HIV Screening  05/04/2017 (Originally 03/30/1966)  . MAMMOGRAM  10/24/2016  . INFLUENZA VACCINE  10/29/2016  . PNA vac Low Risk Adult (2 of 2 - PPSV23) 04/03/2017  . COLONOSCOPY  12/26/2017  . TETANUS/TDAP  07/29/2023  . DEXA SCAN  Completed   Health Maintenance Review LIFESTYLE:  Exercise:   [facility] at senior center  Marine scientist .  Tobacco/ETS: no Alcohol:  Moderate  2 per month Sugar beverages: no soda  Sleep: about  5- 7  Drug use: no  HH:  2 no pets  Work Psychologist, occupational.  Volunteer s .   Dr Julien Girt  For gyne .    Hearing:  Ok   Vision:  No limitations at present . Last eye check UTD  Safety:  Has smoke detector and wears seat belts.  No firearms. No excess sun exposure. Sees dentist regularly.  Falls: n  Advance directive :  Reviewed  Has one.  Memory: Felt to be good  , no concern from her or her family.  Depression: No anhedonia unusual crying or depressive symptoms  Nutrition: Eats well balanced diet; adequate calcium and vitamin D. No swallowing chewing problems.  Injury: no major injuries in the last six months.  Other healthcare providers:  Reviewed today .  Social:  Lives with spouse married.    Preventive parameters: up-to-date  Reviewed   ADLS:   There are no problems or need for assistance  driving, feeding, obtaining food, dressing, toileting and bathing, managing money using phone. She is independent.    ROS:    GEN/ HEENT: No fever, significant weight changes sweats headaches vision problems hearing changes, CV/ PULM; No chest pain shortness of breath cough, syncope,edema  change in exercise tolerance. GI /GU: No adominal pain, vomiting, change in bowel habits. No blood in the stool. No significant GU symptoms. SKIN/HEME: ,no acute skin rashes suspicious lesions or bleeding. No lymphadenopathy, nodules, masses.  NEURO/ PSYCH:  No neurologic signs such as weakness numbness. No depression anxiety. IMM/ Allergy: No unusual infections.  Allergy .   REST of 12 system review negative except as per HPI   Past Medical History:  Diagnosis Date  . Asthma   . Edema   . Fibroids   . Hyperlipidemia   . Hypertension   . Obesity   . Thyroid nodule 01/2009   left discovered on chest ct; needle bx "indeterminant" cannon r/o follicular neoplasm     Family History  Problem Relation Age of Onset  . Arthritis Mother   . Other Mother        neurological disorder  . Pancreatic cancer Mother   . Heart disease Father   . Thyroid nodules Sister   . Heart disease Brother        x2 1 brother deceased    Social History   Social History  . Marital status: Married  Spouse name: N/A  . Number of children: 0  . Years of education: N/A   Occupational History  . retired    Social History Main Topics  . Smoking status: Former Smoker    Packs/day: 1.00    Years: 1.00    Types: Cigarettes    Quit date: 04/29/1971  . Smokeless tobacco: Never Used  . Alcohol use 0.0 oz/week     Comment: occ  . Drug use: No  . Sexual activity: No   Other Topics Concern  . None   Social History Narrative   Married   Occupation: Chiropractor currently unemployed   Married   Regular exercise-no   Nathalie of 2   Pet cat          Outpatient Encounter Prescriptions as of 10/17/2016  Medication Sig  . albuterol (PROAIR HFA) 108 (90 BASE) MCG/ACT inhaler Inhale 2 puffs into the lungs every 6 (six) hours as needed for  wheezing.  [name] b complex vitamins tablet Take 1 tablet by mouth daily.  . Cholecalciferol 4000 units CAPS Take by mouth. PATIENT USING OIL AND NOT CAPSULES  . Fluticasone-Salmeterol (ADVAIR DISKUS) 100-50 MCG/DOSE AEPB INHALE 1 PUFF INTO THE LUNGS TWICE A DAY OR AS DIRECTED  . levothyroxine (SYNTHROID, LEVOTHROID) 50 MCG tablet Take 1 tablet by mouth daily.  [name] loratadine (CLARITIN) 10 MG tablet Take 10 mg by mouth daily.  [name] losartan (COZAAR) 100 MG tablet Take 1 tablet (100 mg total) by mouth daily.  . MULTIPLE VITAMIN PO Take 1 tablet by mouth 2 (two) times daily. Vitafusion.  . Probiotic Product (PROBIOTIC DAILY PO) Take 1 tablet by mouth 2 (two) times daily. Insync  . [DISCONTINUED] ranitidine (ZANTAC) 150 MG tablet take 1 tablet by mouth twice a day   No facility-administered encounter medications on file as of 10/17/2016.     EXAM:  BP 130/80 (BP Location: Right Arm, Patient Position: Sitting, Cuff Size: Normal)   Pulse 72   Temp 97.8 F (36.6 C) (Oral)   Ht 5' 7.75" (1.721 m)   Wt (!) 300 lb 11.2 oz (136.4 kg)   BMI 46.06 kg/m   Body mass index is 46.06 kg/m.  Physical Exam: Vital signs reviewed TKP:TWSF is a well-developed well-nourished alert cooperative   who appears stated age in no acute distress.  HEENT: normocephalic atraumatic , Eyes: PERRL EOM's full, conjunctiva clear, Nares: paten,t no deformity discharge or tenderness., Ears: no deformity EAC's clear TMs with normal landmarks. Mouth: clear OP, no lesions, edema.  Moist mucous membranes. Dentition in adequate repair. NECK: supple without masses, thyromegaly or bruits. ? Nodule left CHEST/PULM:  Clear to auscultation and percussion breath sounds equal no wheeze , rales or rhonchi. No chest wall deformities or tenderness. CV: PMI is nondisplaced, S1 S2 no gallops, murmurs, rubs. Peripheral pulses are full without delay.No JVD . Breast: normal by inspection . No dimpling, discharge, masses, tenderness or discharge  . ABDOMEN: Bowel sounds normal nontender  No guard or rebound, no hepato splenomegal no CVA tenderness.   Extremtities:  No clubbing cyanosis large leg edema 1+  No lesions edema, no acute joint swelling or redness no focal atrophy NEURO:  Oriented x3, cranial nerves 3-12 appear to be intact, no obvious focal weakness,gait within normal limits no abnormal reflexes or asymmetrical SKIN: No acute rashes normal turgor, color, no bruising or petechiae. PSYCH: Oriented, good eye contact, no obvious depression anxiety, cognition and judgment appear normal. LN: no cervical axillary l adenopathy No noted deficits  in memory, attention, and speech.   Lab Results  Component Value Date   WBC 6.6 10/17/2016   HGB 13.0 10/17/2016   HCT 39.8 10/17/2016   PLT 236.0 10/17/2016   GLUCOSE 99 10/17/2016   CHOL 197 10/17/2016   TRIG 77.0 10/17/2016   HDL 44.30 10/17/2016   LDLDIRECT 141.7 04/03/2011   LDLCALC 138 (H) 10/17/2016   ALT 17 10/17/2016   AST 13 10/17/2016   NA 139 10/17/2016   K 4.5 10/17/2016   CL 100 10/17/2016   CREATININE 0.63 10/17/2016   BUN 13 10/17/2016   CO2 33 (H) 10/17/2016   TSH 2.86 10/17/2016   INR 0.98 08/02/2013   HGBA1C 6.1 04/19/2015    ASSESSMENT AND PLAN:  Discussed the following assessment and plan:  Welcome to Medicare preventive visit - Plan: Basic metabolic panel, CBC with Differential/Platelet, Hepatic function panel, Lipid panel, TSH  Medication management - Plan: Basic metabolic panel, CBC with Differential/Platelet, Hepatic function panel, Lipid panel, TSH  Essential hypertension - Plan: Basic metabolic panel, CBC with Differential/Platelet, Hepatic function panel, Lipid panel, TSH  Low HDL (under 40) - Plan: Basic metabolic panel, CBC with Differential/Platelet, Hepatic function panel, Lipid panel, TSH  Morbid obesity with BMI of 40.0-44.9, adult (HCC) - Plan: Basic metabolic panel, CBC with Differential/Platelet, Hepatic function panel, Lipid panel,  TSH  Hypothyroidism, unspecified type - Plan: Basic metabolic panel, CBC with Differential/Platelet, Hepatic function panel, Lipid panel, TSH  THYROID NODULE - Plan: Basic metabolic panel, CBC with Differential/Platelet, Hepatic function panel, Lipid panel, TSH Reviewed preventive care continue medication control thyroid hypertension. Discussed healthy weight and healthy weight loss any help that we can give. Reviewed findings.  Patient Care Team: Panosh, [name], MD as PCP - General [name], MD as Consulting Physician (Endocrinology)  Patient Instructions   colonscopy  .  Fu due   Vernard Gambles Grix vaccine at pharmacy if you wish Booster Pneumovax after January 2019   If all ok then yearly check up     Preventive Care 65 Years and Older, Female Preventive care refers to lifestyle choices and visits with your health care provider that can promote health and wellness. What does preventive care include?  A yearly physical exam. This is also called an annual well check.  Dental exams once or twice a year.  Routine eye exams. Ask your health care provider how often you should have your eyes checked.  Personal lifestyle choices, including: ? Daily care of your teeth and gums. ? Regular physical activity. ? Eating a healthy diet. ? Avoiding tobacco and drug use. ? Limiting alcohol use. ? Practicing safe sex. ? Taking low-dose aspirin every day. ? Taking vitamin and mineral supplements as recommended by your health care provider. What happens during an annual well check? The services and screenings done by your health care provider during your annual well check will depend on your age, overall health, lifestyle risk factors, and family history of disease. Counseling Your health care provider may ask you questions about your:  Alcohol use.  Tobacco use.  Drug use.  Emotional well-being.  Home and relationship well-being.  Sexual activity.  Eating habits.  History of  falls.  Memory and ability to understand (cognition).  Work and work Statistician.  Reproductive health.  Screening You may have the following tests or measurements:  Height, weight, and BMI.  Blood pressure.  Lipid and cholesterol levels. These may be checked every 5 years, or more frequently if you are over 50 years  old.  Skin check.  Lung cancer screening. You may have this screening every year starting at age 11 if you have a 30-pack-year history of smoking and currently smoke or have quit within the past 15 years.  Fecal occult blood test (FOBT) of the stool. You may have this test every year starting at age 52.  Flexible sigmoidoscopy or colonoscopy. You may have a sigmoidoscopy every 5 years or a colonoscopy every 10 years starting at age 67.  Hepatitis C blood test.  Hepatitis B blood test.  Sexually transmitted disease (STD) testing.  Diabetes screening. This is done by checking your blood sugar (glucose) after you have not eaten for a while (fasting). You may have this done every 1-3 years.  Bone density scan. This is done to screen for osteoporosis. You may have this done starting at age 39.  Mammogram. This may be done every 1-2 years. Talk to your health care provider about how often you should have regular mammograms.  Talk with your health care provider about your test results, treatment options, and if necessary, the need for more tests. Vaccines Your health care provider may recommend certain vaccines, such as:  Influenza vaccine. This is recommended every year.  Tetanus, diphtheria, and acellular pertussis (Tdap, Td) vaccine. You may need a Td booster every 10 years.  Varicella vaccine. You may need this if you have not been vaccinated.  Zoster vaccine. You may need this after age 69.  Measles, mumps, and rubella (MMR) vaccine. You may need at least one dose of MMR if you were born in 1957 or later. You may also need a second dose.  Pneumococcal  13-valent conjugate (PCV13) vaccine. One dose is recommended after age 81.  Pneumococcal polysaccharide (PPSV23) vaccine. One dose is recommended after age 50.  Meningococcal vaccine. You may need this if you have certain conditions.  Hepatitis A vaccine. You may need this if you have certain conditions or if you travel or work in places where you may be exposed to hepatitis A.  Hepatitis B vaccine. You may need this if you have certain conditions or if you travel or work in places where you may be exposed to hepatitis B.  Haemophilus influenzae type b (Hib) vaccine. You may need this if you have certain conditions.  Talk to your health care provider about which screenings and vaccines you need and how often you need them. This information is not intended to replace advice given to you by your health care provider. Make sure you discuss any questions you have with your health care provider. Document Released: 04/13/2015 Document Revised: 12/05/2015 Document Reviewed: 01/16/2015 Elsevier Interactive Patient Education  2017 [place]theimer K. Panosh M.D.

## 2016-10-17 ENCOUNTER — Ambulatory Visit (INDEPENDENT_AMBULATORY_CARE_PROVIDER_SITE_OTHER): Payer: Medicare HMO | Admitting: Internal Medicine

## 2016-10-17 ENCOUNTER — Encounter: Payer: Self-pay | Admitting: Internal Medicine

## 2016-10-17 VITALS — BP 130/80 | HR 72 | Temp 97.8°F | Ht 67.75 in | Wt 300.7 lb

## 2016-10-17 DIAGNOSIS — Z0000 Encounter for general adult medical examination without abnormal findings: Secondary | ICD-10-CM | POA: Diagnosis not present

## 2016-10-17 DIAGNOSIS — E786 Lipoprotein deficiency: Secondary | ICD-10-CM

## 2016-10-17 DIAGNOSIS — E041 Nontoxic single thyroid nodule: Secondary | ICD-10-CM | POA: Diagnosis not present

## 2016-10-17 DIAGNOSIS — E039 Hypothyroidism, unspecified: Secondary | ICD-10-CM | POA: Diagnosis not present

## 2016-10-17 DIAGNOSIS — Z79899 Other long term (current) drug therapy: Secondary | ICD-10-CM

## 2016-10-17 DIAGNOSIS — I10 Essential (primary) hypertension: Secondary | ICD-10-CM

## 2016-10-17 DIAGNOSIS — Z6841 Body Mass Index (BMI) 40.0 and over, adult: Secondary | ICD-10-CM

## 2016-10-17 LAB — CBC WITH DIFFERENTIAL/PLATELET
Basophils Absolute: 0 10*3/uL (ref 0.0–0.1)
Basophils Relative: 0.6 % (ref 0.0–3.0)
Eosinophils Absolute: 0.2 10*3/uL (ref 0.0–0.7)
Eosinophils Relative: 3.7 % (ref 0.0–5.0)
HCT: 39.8 % (ref 36.0–46.0)
Hemoglobin: 13 g/dL (ref 12.0–15.0)
Lymphocytes Relative: 22.6 % (ref 12.0–46.0)
Lymphs Abs: 1.5 10*3/uL (ref 0.7–4.0)
MCHC: 32.6 g/dL (ref 30.0–36.0)
MCV: 87.4 fl (ref 78.0–100.0)
Monocytes Absolute: 0.4 10*3/uL (ref 0.1–1.0)
Monocytes Relative: 5.4 % (ref 3.0–12.0)
Neutro Abs: 4.5 10*3/uL (ref 1.4–7.7)
Neutrophils Relative %: 67.7 % (ref 43.0–77.0)
Platelets: 236 10*3/uL (ref 150.0–400.0)
RBC: 4.55 Mil/uL (ref 3.87–5.11)
RDW: 14 % (ref 11.5–15.5)
WBC: 6.6 10*3/uL (ref 4.0–10.5)

## 2016-10-17 LAB — BASIC METABOLIC PANEL
BUN: 13 mg/dL (ref 6–23)
CO2: 33 mEq/L — ABNORMAL HIGH (ref 19–32)
Calcium: 9.2 mg/dL (ref 8.4–10.5)
Chloride: 100 mEq/L (ref 96–112)
Creatinine, Ser: 0.63 mg/dL (ref 0.40–1.20)
GFR: 100.63 mL/min (ref >60.00)
Glucose, Bld: 99 mg/dL (ref 70–99)
Potassium: 4.5 mEq/L (ref 3.5–5.1)
Sodium: 139 mEq/L (ref 135–145)

## 2016-10-17 LAB — HEPATIC FUNCTION PANEL
ALT: 17 U/L (ref 0–35)
AST: 13 U/L (ref 0–37)
Albumin: 3.6 g/dL (ref 3.5–5.2)
Alkaline Phosphatase: 87 U/L (ref 39–117)
Bilirubin, Direct: 0.1 mg/dL (ref 0.0–0.3)
Total Bilirubin: 0.7 mg/dL (ref 0.2–1.2)
Total Protein: 6.3 g/dL (ref 6.0–8.3)

## 2016-10-17 LAB — LIPID PANEL
Cholesterol: 197 mg/dL (ref 0–200)
HDL: 44.3 mg/dL (ref >39.00)
LDL Cholesterol: 138 mg/dL — ABNORMAL HIGH (ref 0–99)
NonHDL: 153.08
Total CHOL/HDL Ratio: 4
Triglycerides: 77 mg/dL (ref 0.0–149.0)
VLDL: 15.4 mg/dL (ref 0.0–40.0)

## 2016-10-17 LAB — TSH: TSH: 2.86 u[IU]/mL (ref 0.35–4.50)

## 2016-10-17 NOTE — Patient Instructions (Addendum)
colonscopy  .  Fu due   Vernard Gambles Grix vaccine at pharmacy if you wish Booster Pneumovax after January 2019   If all ok then yearly check up     Preventive Care 66 Years and Older, Female Preventive care refers to lifestyle choices and visits with your health care provider that can promote health and wellness. What does preventive care include?  A yearly physical exam. This is also called an annual well check.  Dental exams once or twice a year.  Routine eye exams. Ask your health care provider how often you should have your eyes checked.  Personal lifestyle choices, including: ? Daily care of your teeth and gums. ? Regular physical activity. ? Eating a healthy diet. ? Avoiding tobacco and drug use. ? Limiting alcohol use. ? Practicing safe sex. ? Taking low-dose aspirin every day. ? Taking vitamin and mineral supplements as recommended by your health care provider. What happens during an annual well check? The services and screenings done by your health care provider during your annual well check will depend on your age, overall health, lifestyle risk factors, and family history of disease. Counseling Your health care provider may ask you questions about your:  Alcohol use.  Tobacco use.  Drug use.  Emotional well-being.  Home and relationship well-being.  Sexual activity.  Eating habits.  History of falls.  Memory and ability to understand (cognition).  Work and work Statistician.  Reproductive health.  Screening You may have the following tests or measurements:  Height, weight, and BMI.  Blood pressure.  Lipid and cholesterol levels. These may be checked every 5 years, or more frequently if you are over 11 years old.  Skin check.  Lung cancer screening. You may have this screening every year starting at age 66 if you have a 30-pack-year history of smoking and currently smoke or have quit within the past 15 years.  Fecal occult blood test (FOBT) of the  stool. You may have this test every year starting at age 66.  Flexible sigmoidoscopy or colonoscopy. You may have a sigmoidoscopy every 5 years or a colonoscopy every 10 years starting at age 66.  Hepatitis C blood test.  Hepatitis B blood test.  Sexually transmitted disease (STD) testing.  Diabetes screening. This is done by checking your blood sugar (glucose) after you have not eaten for a while (fasting). You may have this done every 1-3 years.  Bone density scan. This is done to screen for osteoporosis. You may have this done starting at age 66.  Mammogram. This may be done every 1-2 years. Talk to your health care provider about how often you should have regular mammograms.  Talk with your health care provider about your test results, treatment options, and if necessary, the need for more tests. Vaccines Your health care provider may recommend certain vaccines, such as:  Influenza vaccine. This is recommended every year.  Tetanus, diphtheria, and acellular pertussis (Tdap, Td) vaccine. You may need a Td booster every 10 years.  Varicella vaccine. You may need this if you have not been vaccinated.  Zoster vaccine. You may need this after age 6.  Measles, mumps, and rubella (MMR) vaccine. You may need at least one dose of MMR if you were born in 1957 or later. You may also need a second dose.  Pneumococcal 13-valent conjugate (PCV13) vaccine. One dose is recommended after age 666.  Pneumococcal polysaccharide (PPSV23) vaccine. One dose is recommended after age 666.  Meningococcal vaccine. You may need  this if you have certain conditions.  Hepatitis A vaccine. You may need this if you have certain conditions or if you travel or work in places where you may be exposed to hepatitis A.  Hepatitis B vaccine. You may need this if you have certain conditions or if you travel or work in places where you may be exposed to hepatitis B.  Haemophilus influenzae type b (Hib) vaccine. You  may need this if you have certain conditions.  Talk to your health care provider about which screenings and vaccines you need and how often you need them. This information is not intended to replace advice given to you by your health care provider. Make sure you discuss any questions you have with your health care provider. Document Released: 04/13/2015 Document Revised: 12/05/2015 Document Reviewed: 01/16/2015 Elsevier Interactive Patient Education  2017 [employer].

## 2016-10-23 ENCOUNTER — Other Ambulatory Visit: Payer: Self-pay | Admitting: Internal Medicine

## 2016-11-06 ENCOUNTER — Encounter: Payer: Self-pay | Admitting: Internal Medicine

## 2016-11-06 DIAGNOSIS — R69 Illness, unspecified: Secondary | ICD-10-CM | POA: Diagnosis not present

## 2016-11-06 DIAGNOSIS — E049 Nontoxic goiter, unspecified: Secondary | ICD-10-CM | POA: Diagnosis not present

## 2016-11-06 DIAGNOSIS — E02 Subclinical iodine-deficiency hypothyroidism: Secondary | ICD-10-CM | POA: Diagnosis not present

## 2016-11-07 ENCOUNTER — Other Ambulatory Visit: Payer: Self-pay | Admitting: Obstetrics and Gynecology

## 2016-11-07 DIAGNOSIS — Z1231 Encounter for screening mammogram for malignant neoplasm of breast: Secondary | ICD-10-CM

## 2016-11-13 ENCOUNTER — Ambulatory Visit
Admission: RE | Admit: 2016-11-13 | Discharge: 2016-11-13 | Disposition: A | Discharge status: Home / Self Care | Payer: Medicare HMO | Source: Ambulatory Visit | Attending: Obstetrics and Gynecology | Admitting: Obstetrics and Gynecology

## 2016-11-13 DIAGNOSIS — Z1231 Encounter for screening mammogram for malignant neoplasm of breast: Secondary | ICD-10-CM | POA: Diagnosis not present

## 2016-11-16 ENCOUNTER — Encounter: Payer: Self-pay | Admitting: Internal Medicine

## 2016-11-17 ENCOUNTER — Encounter: Payer: Self-pay | Admitting: Internal Medicine

## 2016-11-17 DIAGNOSIS — N958 Other specified menopausal and perimenopausal disorders: Secondary | ICD-10-CM | POA: Diagnosis not present

## 2016-11-17 DIAGNOSIS — Z124 Encounter for screening for malignant neoplasm of cervix: Secondary | ICD-10-CM | POA: Diagnosis not present

## 2016-11-17 DIAGNOSIS — Z6841 Body Mass Index (BMI) 40.0 and over, adult: Secondary | ICD-10-CM | POA: Diagnosis not present

## 2016-11-17 DIAGNOSIS — N762 Acute vulvitis: Secondary | ICD-10-CM | POA: Diagnosis not present

## 2016-12-19 ENCOUNTER — Encounter: Payer: Self-pay | Admitting: Internal Medicine

## 2017-01-05 ENCOUNTER — Other Ambulatory Visit: Payer: Self-pay | Admitting: Internal Medicine

## 2017-01-05 DIAGNOSIS — E02 Subclinical iodine-deficiency hypothyroidism: Secondary | ICD-10-CM | POA: Diagnosis not present

## 2017-03-11 DIAGNOSIS — E02 Subclinical iodine-deficiency hypothyroidism: Secondary | ICD-10-CM | POA: Diagnosis not present

## 2017-04-01 ENCOUNTER — Other Ambulatory Visit: Payer: Self-pay | Admitting: Endocrinology

## 2017-04-01 DIAGNOSIS — E049 Nontoxic goiter, unspecified: Secondary | ICD-10-CM

## 2017-05-13 ENCOUNTER — Ambulatory Visit
Admission: RE | Admit: 2017-05-13 | Discharge: 2017-05-13 | Disposition: A | Discharge status: Home / Self Care | Payer: Medicare HMO | Source: Ambulatory Visit | Attending: Endocrinology | Admitting: Endocrinology

## 2017-05-13 DIAGNOSIS — E049 Nontoxic goiter, unspecified: Secondary | ICD-10-CM

## 2017-05-13 DIAGNOSIS — E042 Nontoxic multinodular goiter: Secondary | ICD-10-CM | POA: Diagnosis not present

## 2017-06-24 ENCOUNTER — Other Ambulatory Visit: Payer: Self-pay | Admitting: Internal Medicine

## 2017-07-15 ENCOUNTER — Other Ambulatory Visit: Payer: Self-pay | Admitting: Internal Medicine

## 2017-07-16 NOTE — Telephone Encounter (Signed)
Pt need to schedule her CPE in July for more refills

## 2017-07-21 ENCOUNTER — Encounter: Payer: Self-pay | Admitting: Internal Medicine

## 2017-09-21 ENCOUNTER — Other Ambulatory Visit: Payer: Self-pay | Admitting: Internal Medicine

## 2017-09-23 NOTE — Telephone Encounter (Signed)
Sent to the pharmacy by e-scribe.  I scheduled pt for cpx on 11/04/17.

## 2017-10-11 ENCOUNTER — Other Ambulatory Visit: Payer: Self-pay | Admitting: Internal Medicine

## 2017-10-19 ENCOUNTER — Other Ambulatory Visit: Payer: Self-pay | Admitting: Obstetrics and Gynecology

## 2017-10-19 DIAGNOSIS — Z1231 Encounter for screening mammogram for malignant neoplasm of breast: Secondary | ICD-10-CM

## 2017-11-03 NOTE — Progress Notes (Signed)
Chief Complaint  Patient presents with  . Annual Exam    No new concerns palpitations  . Medication Management  . Asthma  . Hyperlipidemia  . Hypertension    HPI: Amy Stokes 67 y.o. comes in today for Preventive Medicare exam/ wellness visit . And med evaluation   BP seems at goal  Asthma resp  ocass wehezing  But advair seems to control  Needs refill   Thyroid sees dr Chalmers Cater soon   [facility] had palpitations   recently  Like tachycardia  w o assoc sx  Took a rhyrym strip from ehr device     To get mammo in a few weeks   OSA   Last 2 doc left  Using cpap Health Maintenance  Topic Date Due  . Hepatitis C Screening  [birth year]  . PNA vac Low Risk Adult (2 of 2 - PPSV23) 04/03/2017  . INFLUENZA VACCINE  10/29/2017  . PAP SMEAR  11/04/2017 (Originally 09/29/2015)  . MAMMOGRAM  11/13/2017  . COLONOSCOPY  12/26/2017  . TETANUS/TDAP  07/29/2023  . DEXA SCAN  Completed   Health Maintenance Review LIFESTYLE:  Exercise:   sslacking   Fitness room  Some   Circuit wieght  Tobacco/ETS:  no Alcohol:   Very occass Sugar beverages:   Minimum water    Sleep:  About 6    +   Drug use: no HH:2  No pets   Volunteer  Per week hpours at a time.   Hearing: ok  Vision:  No limitations at present . Last eye check UTD  Safety:  Has smoke detector and wears seat belts.  . No excess sun exposure. Sees dentist regularly.  Falls: n  Preventive parameters: Reviewed   ADLS:   There are no problems or need for assistance  driving, feeding, obtaining food, dressing, toileting and bathing, managing money using phone. She is independent.     ROS:  See hpi weight not going down  Planning on trying  limted eating times  GEN/ HEENT: No fever, significant weight changes sweats headaches vision problems hearing changes, CV/ PULM; No chest pain shortness of breath cough, syncope,edema  change in exercise tolerance. GI /GU: No adominal pain, vomiting, change in bowel habits. No blood in the  stool. No significant GU symptoms. SKIN/HEME: ,no acute skin rashes suspicious lesions or bleeding. No lymphadenopathy, nodules, masses.  NEURO/ PSYCH:  No neurologic signs such as weakness numbness. No depression anxiety. IMM/ Allergy: No unusual infections.  Allergy .   REST of 12 system review negative except as per HPI   Past Medical History:  Diagnosis Date  . Asthma   . Edema   . Fibroids   . Hyperlipidemia   . Hypertension   . Obesity   . Thyroid nodule 01/2009   left discovered on chest ct; needle bx "indeterminant" cannon r/o follicular neoplasm     Family History  Problem Relation Age of Onset  . Arthritis Mother   . Other Mother        neurological disorder  . Pancreatic cancer Mother   . Heart disease Father   . Thyroid nodules Sister   . Heart disease Brother        x2 1 brother deceased    Social History   Socioeconomic History  . Marital status: Married    Spouse name: Not on file  . Number of children: 0  . Years of education: Not on file  . Highest education level: Not on  file  Occupational History  . Occupation: retired  Scientific laboratory technician  . Financial resource strain: Not on file  . Food insecurity:    Worry: Not on file    Inability: Not on file  . Transportation needs:    Medical: Not on file    Non-medical: Not on file  Tobacco Use  . Smoking status: Former Smoker    Packs/day: 1.00    Years: 1.00    Pack years: 1.00    Types: Cigarettes    Last attempt to quit: 04/29/1971    Years since quitting: 46.5  . Smokeless tobacco: Never Used  Substance and Sexual Activity  . Alcohol use: Yes    Alcohol/week: 0.0 oz    Comment: occ  . Drug use: No  . Sexual activity: Never  Lifestyle  . Physical activity:    Days per week: Not on file    Minutes per session: Not on file  . Stress: Not on file  Relationships  . Social connections:    Talks on phone: Not on file    Gets together: Not on file    Attends religious service: Not on file     Active member of club or organization: Not on file    Attends meetings of clubs or organizations: Not on file    Relationship status: Not on file  Other Topics Concern  . Not on file  Social History Narrative   Married   Occupation: Chiropractor currently unemployed   Married   Regular exercise-no   [place] of 2   Pet cat       Outpatient Encounter Medications as of 11/04/2017  Medication Sig  . ADVAIR DISKUS 100-50 MCG/DOSE AEPB INHALE 1 DOSE BY MOUTH TWICE DAILY. RINSE MOUTH AFTER USE  . albuterol (PROAIR HFA) 108 (90 BASE) MCG/ACT inhaler Inhale 2 puffs into the lungs every 6 (six) hours as needed for wheezing.  [name] aspirin EC 81 MG tablet Take 81 mg by mouth daily.  . Cholecalciferol 4000 units CAPS Take by mouth. PATIENT USING OIL AND NOT CAPSULES  . levothyroxine (SYNTHROID, LEVOTHROID) 88 MCG tablet Take 88 mcg by mouth daily.  [name] loratadine (CLARITIN) 10 MG tablet Take 10 mg by mouth daily.  [name] losartan (COZAAR) 100 MG tablet TAKE 1 TABLET BY MOUTH EVERY DAY  . [DISCONTINUED] levothyroxine (SYNTHROID, LEVOTHROID) 75 MCG tablet Take 75 mcg by mouth daily before breakfast.  . [DISCONTINUED] MULTIPLE VITAMIN PO Take 1 tablet by mouth 2 (two) times daily. Vitafusion.  . [DISCONTINUED] Probiotic Product (PROBIOTIC DAILY PO) Take 1 tablet by mouth 2 (two) times daily. Insync   No facility-administered encounter medications on file as of 11/04/2017.     EXAM:  BP 122/70 (BP Location: Right Arm, Patient Position: Sitting, Cuff Size: Normal)   Pulse 69   Temp 98.1 F (36.7 C) (Oral)   Ht '5\' 8"'  (1.727 m)   Wt (!) 307 lb 3.2 oz (139.3 kg)   BMI 46.71 kg/m   Body mass index is 46.71 kg/m.  Physical Exam: Vital signs reviewed OAC:ZYSA is a well-developed well-nourished alert cooperative   who appears stated age in no acute distress.  HEENT: normocephalic atraumatic , Eyes: PERRL EOM's full, conjunctiva clear, Nares: paten,t no deformity discharge or tenderness., Ears: no deformity  EAC's clear TMs with normal landmarks. Mouth: clear OP, no lesions, edema.  Moist mucous membranes. Dentition in adequate repair. NECK: supple without masses, thyromegaly or bruits. Breast: normal by inspection . No dimpling, discharge, masses,  tenderness or discharge . CHEST/PULM:  Clear to auscultation and percussion breath sounds equal no wheeze , rales or rhonchi. No chest wall deformities or tenderness. CV: PMI is nondisplaced, S1 S2 no gallops, murmurs, rubs. Peripheral pulses are present y.No JVD .  ABDOMEN: Bowel sounds normal nontender  No guard or rebound, no hepato splenomegal no CVA tenderness.   Extremtities:  No clubbing cyanosis or edema, no acute joint swelling or redness no focal atrophy  chonric  Skin changes   Swelling no ulcers  NEURO:  Oriented x3, cranial nerves 3-12 appear to be intact, no obvious focal weakness,gait within normal limits no abnormal reflexes or asymmetrical SKIN: No acute rashes normal turgor, color, no bruising or petechiae. PSYCH: Oriented, good eye contact, no obvious depression anxiety, cognition and judgment appear normal. LN: no cervical axillary inguinal adenopathy No noted deficits in memory, attention, and speech. Itchy patches    Left hand  Light color  A bit scaly and rough    ekg sinus rhytym  Poor r wave  prog on ant leads  ? If lead placement sinus thrytym   Strips sent to scan  Some PB ?  Not a real ekg fromdevice   ASSESSMENT AND PLAN:  Discussed the following assessment and plan:  Visit for preventive health examination  Medication management - Plan: Basic metabolic panel, CBC with Differential/Platelet, Hepatic function panel, Lipid panel, TSH, Hemoglobin A1c  Multinodular goiter (nontoxic) - Plan: Basic metabolic panel, CBC with Differential/Platelet, Hepatic function panel, Lipid panel, TSH, Hemoglobin A1c  Low HDL (under 40) - Plan: Basic metabolic panel, CBC with Differential/Platelet, Hepatic function panel, Lipid panel, TSH,  Hemoglobin A1c  MORBID OBESITY - Plan: Basic metabolic panel, CBC with Differential/Platelet, Hepatic function panel, Lipid panel, TSH, Hemoglobin I6N  Uncomplicated asthma, unspecified asthma severity, unspecified whether persistent - Plan: Basic metabolic panel, CBC with Differential/Platelet, Hepatic function panel, Lipid panel, TSH, Hemoglobin A1c  Intermittent palpitations - Plan: Basic metabolic panel, CBC with Differential/Platelet, Hepatic function panel, Lipid panel, TSH, Hemoglobin A1c, EKG 12-Lead, ECHOCARDIOGRAM COMPLETE  Essential hypertension - Plan: Basic metabolic panel, CBC with Differential/Platelet, Hepatic function panel, Lipid panel, TSH, Hemoglobin A1c, ECHOCARDIOGRAM COMPLETE  OSA (obstructive sleep apnea) Disc  weight loso strategies for health  Ok to refill a dvair  Patient Care Team: Panosh, [name], MD as PCP - General [name], MD as Consulting Physician (Endocrinology) Lab Results  Component Value Date   WBC 7.0 11/04/2017   HGB 12.2 11/04/2017   HCT 37.4 11/04/2017   PLT 249.0 11/04/2017   GLUCOSE 91 11/04/2017   CHOL 186 11/04/2017   TRIG 87.0 11/04/2017   HDL 40.00 11/04/2017   LDLDIRECT 141.7 04/03/2011   LDLCALC 128 (H) 11/04/2017   ALT 14 11/04/2017   AST 11 11/04/2017   NA 139 11/04/2017   K 4.1 11/04/2017   CL 101 11/04/2017   CREATININE 0.63 11/04/2017   BUN 10 11/04/2017   CO2 33 (H) 11/04/2017   TSH 1.95 11/04/2017   INR 0.98 08/02/2013   HGBA1C 6.0 11/04/2017    Patient Instructions  Plan echo cardiogram and you will be called for this appt  If  Consider  seeing   Cardiology .if ongoing  Check into   New sleep  Provider .    Health Maintenance, Female Adopting a healthy lifestyle and getting preventive care can go a long way to promote health and wellness. Talk with your health care provider about what schedule of regular examinations is right for you.  This is a good chance for you to check in with your provider about  disease prevention and staying healthy. In between checkups, there are plenty of things you can do on your own. Experts have done a lot of research about which lifestyle changes and preventive measures are most likely to keep you healthy. Ask your health care provider for more information. Weight and diet Eat a healthy diet  Be sure to include plenty of vegetables, fruits, low-fat dairy products, and lean protein.  Do not eat a lot of foods high in solid fats, added sugars, or salt.  Get regular exercise. This is one of the most important things you can do for your health. ? Most adults should exercise for at least 150 minutes each week. The exercise should increase your heart rate and make you sweat (moderate-intensity exercise). ? Most adults should also do strengthening exercises at least twice a week. This is in addition to the moderate-intensity exercise.  Maintain a healthy weight  Body mass index (BMI) is a measurement that can be used to identify possible weight problems. It estimates body fat based on height and weight. Your health care provider can help determine your BMI and help you achieve or maintain a healthy weight.  For females 56 years of age and older: ? A BMI below 18.5 is considered underweight. ? A BMI of 18.5 to 24.9 is normal. ? A BMI of 25 to 29.9 is considered overweight. ? A BMI of 30 and above is considered obese.  Watch levels of cholesterol and blood lipids  You should start having your blood tested for lipids and cholesterol at 67 years of age, then have this test every 5 years.  You may need to have your cholesterol levels checked more often if: ? Your lipid or cholesterol levels are high. ? You are older than 67 years of age. ? You are at high risk for heart disease.  Cancer screening Lung Cancer  Lung cancer screening is recommended for adults 82-45 years old who are at high risk for lung cancer because of a history of smoking.  A yearly low-dose  CT scan of the lungs is recommended for people who: ? Currently smoke. ? Have quit within the past 15 years. ? Have at least a 30-pack-year history of smoking. A pack year is smoking an average of one pack of cigarettes a day for 1 year.  Yearly screening should continue until it has been 15 years since you quit.  Yearly screening should stop if you develop a health problem that would prevent you from having lung cancer treatment.  Breast Cancer  Practice breast self-awareness. This means understanding how your breasts normally appear and feel.  It also means doing regular breast self-exams. Let your health care provider know about any changes, no matter how small.  If you are in your 20s or 30s, you should have a clinical breast exam (CBE) by a health care provider every 1-3 years as part of a regular health exam.  If you are 20 or older, have a CBE every year. Also consider having a breast X-ray (mammogram) every year.  If you have a family history of breast cancer, talk to your health care provider about genetic screening.  If you are at high risk for breast cancer, talk to your health care provider about having an MRI and a mammogram every year.  Breast cancer gene (BRCA) assessment is recommended for women who have family members with BRCA-related cancers. BRCA-related  cancers include: ? Breast. ? Ovarian. ? Tubal. ? Peritoneal cancers.  Results of the assessment will determine the need for genetic counseling and BRCA1 and BRCA2 testing.  Cervical Cancer Your health care provider may recommend that you be screened regularly for cancer of the pelvic organs (ovaries, uterus, and vagina). This screening involves a pelvic examination, including checking for microscopic changes to the surface of your cervix (Pap test). You may be encouraged to have this screening done every 3 years, beginning at age 35.  For women ages 74-65, health care providers may recommend pelvic exams and Pap  testing every 3 years, or they may recommend the Pap and pelvic exam, combined with testing for human papilloma virus (HPV), every 5 years. Some types of HPV increase your risk of cervical cancer. Testing for HPV may also be done on women of any age with unclear Pap test results.  Other health care providers may not recommend any screening for nonpregnant women who are considered low risk for pelvic cancer and who do not have symptoms. Ask your health care provider if a screening pelvic exam is right for you.  If you have had past treatment for cervical cancer or a condition that could lead to cancer, you need Pap tests and screening for cancer for at least 20 years after your treatment. If Pap tests have been discontinued, your risk factors (such as having a new sexual partner) need to be reassessed to determine if screening should resume. Some women have medical problems that increase the chance of getting cervical cancer. In these cases, your health care provider may recommend more frequent screening and Pap tests.  Colorectal Cancer  This type of cancer can be detected and often prevented.  Routine colorectal cancer screening usually begins at 67 years of age and continues through 67 years of age.  Your health care provider may recommend screening at an earlier age if you have risk factors for colon cancer.  Your health care provider may also recommend using home test kits to check for hidden blood in the stool.  A small camera at the end of a tube can be used to examine your colon directly (sigmoidoscopy or colonoscopy). This is done to check for the earliest forms of colorectal cancer.  Routine screening usually begins at age 27.  Direct examination of the colon should be repeated every 5-10 years through 67 years of age. However, you may need to be screened more often if early forms of precancerous polyps or small growths are found.  Skin Cancer  Check your skin from head to toe  regularly.  Tell your health care provider about any new moles or changes in moles, especially if there is a change in a mole's shape or color.  Also tell your health care provider if you have a mole that is larger than the size of a pencil eraser.  Always use sunscreen. Apply sunscreen liberally and repeatedly throughout the day.  Protect yourself by wearing long sleeves, pants, a wide-brimmed hat, and sunglasses whenever you are outside.  Heart disease, diabetes, and high blood pressure  High blood pressure causes heart disease and increases the risk of stroke. High blood pressure is more likely to develop in: ? People who have blood pressure in the high end of the normal range (130-139/85-89 mm Hg). ? People who are overweight or obese. ? People who are African American.  If you are 60-12 years of age, have your blood pressure checked every 3-5 years. If  you are 41 years of age or older, have your blood pressure checked every year. You should have your blood pressure measured twice-once when you are at a hospital or clinic, and once when you are not at a hospital or clinic. Record the average of the two measurements. To check your blood pressure when you are not at a hospital or clinic, you can use: ? An automated blood pressure machine at a pharmacy. ? A home blood pressure monitor.  If you are between 52 years and 44 years old, ask your health care provider if you should take aspirin to prevent strokes.  Have regular diabetes screenings. This involves taking a blood sample to check your fasting blood sugar level. ? If you are at a normal weight and have a low risk for diabetes, have this test once every three years after 67 years of age. ? If you are overweight and have a high risk for diabetes, consider being tested at a younger age or more often. Preventing infection Hepatitis B  If you have a higher risk for hepatitis B, you should be screened for this virus. You are considered  at high risk for hepatitis B if: ? You were born in a country where hepatitis B is common. Ask your health care provider which countries are considered high risk. ? Your parents were born in a high-risk country, and you have not been immunized against hepatitis B (hepatitis B vaccine). ? You have HIV or AIDS. ? You use needles to inject street drugs. ? You live with someone who has hepatitis B. ? You have had sex with someone who has hepatitis B. ? You get hemodialysis treatment. ? You take certain medicines for conditions, including cancer, organ transplantation, and autoimmune conditions.  Hepatitis C  Blood testing is recommended for: ? Everyone born from [birth year] through 1965. ? Anyone with known risk factors for hepatitis C.  Sexually transmitted infections (STIs)  You should be screened for sexually transmitted infections (STIs) including gonorrhea and chlamydia if: ? You are sexually active and are younger than 67 years of age. ? You are older than 67 years of age and your health care provider tells you that you are at risk for this type of infection. ? Your sexual activity has changed since you were last screened and you are at an increased risk for chlamydia or gonorrhea. Ask your health care provider if you are at risk.  If you do not have HIV, but are at risk, it may be recommended that you take a prescription medicine daily to prevent HIV infection. This is called pre-exposure prophylaxis (PrEP). You are considered at risk if: ? You are sexually active and do not regularly use condoms or know the HIV status of your partner(s). ? You take drugs by injection. ? You are sexually active with a partner who has HIV.  Talk with your health care provider about whether you are at high risk of being infected with HIV. If you choose to begin PrEP, you should first be tested for HIV. You should then be tested every 3 months for as long as you are taking PrEP. Pregnancy  If you are  premenopausal and you may become pregnant, ask your health care provider about preconception counseling.  If you may become pregnant, take 400 to 800 micrograms (mcg) of folic acid every day.  If you want to prevent pregnancy, talk to your health care provider about birth control (contraception). Osteoporosis and menopause  Osteoporosis is a  disease in which the bones lose minerals and strength with aging. This can result in serious bone fractures. Your risk for osteoporosis can be identified using a bone density scan.  If you are 59 years of age or older, or if you are at risk for osteoporosis and fractures, ask your health care provider if you should be screened.  Ask your health care provider whether you should take a calcium or vitamin D supplement to lower your risk for osteoporosis.  Menopause may have certain physical symptoms and risks.  Hormone replacement therapy may reduce some of these symptoms and risks. Talk to your health care provider about whether hormone replacement therapy is right for you. Follow these instructions at home:  Schedule regular health, dental, and eye exams.  Stay current with your immunizations.  Do not use any tobacco products including cigarettes, chewing tobacco, or electronic cigarettes.  If you are pregnant, do not drink alcohol.  If you are breastfeeding, limit how much and how often you drink alcohol.  Limit alcohol intake to no more than 1 drink per day for nonpregnant women. One drink equals 12 ounces of beer, 5 ounces of wine, or 1 ounces of hard liquor.  Do not use street drugs.  Do not share needles.  Ask your health care provider for help if you need support or information about quitting drugs.  Tell your health care provider if you often feel depressed.  Tell your health care provider if you have ever been abused or do not feel safe at home. This information is not intended to replace advice given to you by your health care  provider. Make sure you discuss any questions you have with your health care provider. Document Released: 09/30/2010 Document Revised: 08/23/2015 Document Reviewed: 12/19/2014 Elsevier Interactive Patient Education  2018 [place]. Panosh M.D.

## 2017-11-04 ENCOUNTER — Encounter: Payer: Self-pay | Admitting: Internal Medicine

## 2017-11-04 ENCOUNTER — Ambulatory Visit (INDEPENDENT_AMBULATORY_CARE_PROVIDER_SITE_OTHER): Payer: Medicare HMO | Admitting: Internal Medicine

## 2017-11-04 VITALS — BP 122/70 | HR 69 | Temp 98.1°F | Ht 68.0 in | Wt 307.2 lb

## 2017-11-04 DIAGNOSIS — R002 Palpitations: Secondary | ICD-10-CM | POA: Diagnosis not present

## 2017-11-04 DIAGNOSIS — Z79899 Other long term (current) drug therapy: Secondary | ICD-10-CM | POA: Diagnosis not present

## 2017-11-04 DIAGNOSIS — E786 Lipoprotein deficiency: Secondary | ICD-10-CM | POA: Diagnosis not present

## 2017-11-04 DIAGNOSIS — G4733 Obstructive sleep apnea (adult) (pediatric): Secondary | ICD-10-CM

## 2017-11-04 DIAGNOSIS — Z0000 Encounter for general adult medical examination without abnormal findings: Secondary | ICD-10-CM

## 2017-11-04 DIAGNOSIS — J45909 Unspecified asthma, uncomplicated: Secondary | ICD-10-CM

## 2017-11-04 DIAGNOSIS — I10 Essential (primary) hypertension: Secondary | ICD-10-CM

## 2017-11-04 DIAGNOSIS — E042 Nontoxic multinodular goiter: Secondary | ICD-10-CM | POA: Diagnosis not present

## 2017-11-04 LAB — CBC WITH DIFFERENTIAL/PLATELET
Basophils Absolute: 0 10*3/uL (ref 0.0–0.1)
Basophils Relative: 0.4 % (ref 0.0–3.0)
Eosinophils Absolute: 0.2 10*3/uL (ref 0.0–0.7)
Eosinophils Relative: 2.9 % (ref 0.0–5.0)
HCT: 37.4 % (ref 36.0–46.0)
Hemoglobin: 12.2 g/dL (ref 12.0–15.0)
Lymphocytes Relative: 20 % (ref 12.0–46.0)
Lymphs Abs: 1.4 10*3/uL (ref 0.7–4.0)
MCHC: 32.5 g/dL (ref 30.0–36.0)
MCV: 83.9 fl (ref 78.0–100.0)
Monocytes Absolute: 0.4 10*3/uL (ref 0.1–1.0)
Monocytes Relative: 5.3 % (ref 3.0–12.0)
Neutro Abs: 5 10*3/uL (ref 1.4–7.7)
Neutrophils Relative %: 71.4 % (ref 43.0–77.0)
Platelets: 249 10*3/uL (ref 150.0–400.0)
RBC: 4.46 Mil/uL (ref 3.87–5.11)
RDW: 13.9 % (ref 11.5–15.5)
WBC: 7 10*3/uL (ref 4.0–10.5)

## 2017-11-04 LAB — HEPATIC FUNCTION PANEL
ALT: 14 U/L (ref 0–35)
AST: 11 U/L (ref 0–37)
Albumin: 3.5 g/dL (ref 3.5–5.2)
Alkaline Phosphatase: 87 U/L (ref 39–117)
Bilirubin, Direct: 0.1 mg/dL (ref 0.0–0.3)
Total Bilirubin: 0.7 mg/dL (ref 0.2–1.2)
Total Protein: 6.2 g/dL (ref 6.0–8.3)

## 2017-11-04 LAB — BASIC METABOLIC PANEL
BUN: 10 mg/dL (ref 6–23)
CO2: 33 mEq/L — ABNORMAL HIGH (ref 19–32)
Calcium: 9 mg/dL (ref 8.4–10.5)
Chloride: 101 mEq/L (ref 96–112)
Creatinine, Ser: 0.63 mg/dL (ref 0.40–1.20)
GFR: 100.3 mL/min (ref >60.00)
Glucose, Bld: 91 mg/dL (ref 70–99)
Potassium: 4.1 mEq/L (ref 3.5–5.1)
Sodium: 139 mEq/L (ref 135–145)

## 2017-11-04 LAB — LIPID PANEL
Cholesterol: 186 mg/dL (ref 0–200)
HDL: 40 mg/dL (ref >39.00)
LDL Cholesterol: 128 mg/dL — ABNORMAL HIGH (ref 0–99)
NonHDL: 145.6
Total CHOL/HDL Ratio: 5
Triglycerides: 87 mg/dL (ref 0.0–149.0)
VLDL: 17.4 mg/dL (ref 0.0–40.0)

## 2017-11-04 LAB — TSH: TSH: 1.95 u[IU]/mL (ref 0.35–4.50)

## 2017-11-04 LAB — HEMOGLOBIN A1C: Hgb A1c MFr Bld: 6 % (ref 4.6–6.5)

## 2017-11-04 NOTE — Patient Instructions (Addendum)
Plan echo cardiogram and you will be called for this appt  If  Consider  seeing   Cardiology .if ongoing  Check into   New sleep  Provider .    Health Maintenance, Female Adopting a healthy lifestyle and getting preventive care can go a long way to promote health and wellness. Talk with your health care provider about what schedule of regular examinations is right for you. This is a good chance for you to check in with your provider about disease prevention and staying healthy. In between checkups, there are plenty of things you can do on your own. Experts have done a lot of research about which lifestyle changes and preventive measures are most likely to keep you healthy. Ask your health care provider for more information. Weight and diet Eat a healthy diet  Be sure to include plenty of vegetables, fruits, low-fat dairy products, and lean protein.  Do not eat a lot of foods high in solid fats, added sugars, or salt.  Get regular exercise. This is one of the most important things you can do for your health. ? Most adults should exercise for at least 150 minutes each week. The exercise should increase your heart rate and make you sweat (moderate-intensity exercise). ? Most adults should also do strengthening exercises at least twice a week. This is in addition to the moderate-intensity exercise.  Maintain a healthy weight  Body mass index (BMI) is a measurement that can be used to identify possible weight problems. It estimates body fat based on height and weight. Your health care provider can help determine your BMI and help you achieve or maintain a healthy weight.  For females 40 years of age and older: ? A BMI below 18.5 is considered underweight. ? A BMI of 18.5 to 24.9 is normal. ? A BMI of 25 to 29.9 is considered overweight. ? A BMI of 30 and above is considered obese.  Watch levels of cholesterol and blood lipids  You should start having your blood tested for lipids and  cholesterol at 67 years of age, then have this test every 5 years.  You may need to have your cholesterol levels checked more often if: ? Your lipid or cholesterol levels are high. ? You are older than 68 years of age. ? You are at high risk for heart disease.  Cancer screening Lung Cancer  Lung cancer screening is recommended for adults 54-93 years old who are at high risk for lung cancer because of a history of smoking.  A yearly low-dose CT scan of the lungs is recommended for people who: ? Currently smoke. ? Have quit within the past 15 years. ? Have at least a 30-pack-year history of smoking. A pack year is smoking an average of one pack of cigarettes a day for 1 year.  Yearly screening should continue until it has been 15 years since you quit.  Yearly screening should stop if you develop a health problem that would prevent you from having lung cancer treatment.  Breast Cancer  Practice breast self-awareness. This means understanding how your breasts normally appear and feel.  It also means doing regular breast self-exams. Let your health care provider know about any changes, no matter how small.  If you are in your 20s or 30s, you should have a clinical breast exam (CBE) by a health care provider every 1-3 years as part of a regular health exam.  If you are 58 or older, have a CBE every year.  Also consider having a breast X-ray (mammogram) every year.  If you have a family history of breast cancer, talk to your health care provider about genetic screening.  If you are at high risk for breast cancer, talk to your health care provider about having an MRI and a mammogram every year.  Breast cancer gene (BRCA) assessment is recommended for women who have family members with BRCA-related cancers. BRCA-related cancers include: ? Breast. ? Ovarian. ? Tubal. ? Peritoneal cancers.  Results of the assessment will determine the need for genetic counseling and BRCA1 and BRCA2  testing.  Cervical Cancer Your health care provider may recommend that you be screened regularly for cancer of the pelvic organs (ovaries, uterus, and vagina). This screening involves a pelvic examination, including checking for microscopic changes to the surface of your cervix (Pap test). You may be encouraged to have this screening done every 3 years, beginning at age 56.  For women ages 69-65, health care providers may recommend pelvic exams and Pap testing every 3 years, or they may recommend the Pap and pelvic exam, combined with testing for human papilloma virus (HPV), every 5 years. Some types of HPV increase your risk of cervical cancer. Testing for HPV may also be done on women of any age with unclear Pap test results.  Other health care providers may not recommend any screening for nonpregnant women who are considered low risk for pelvic cancer and who do not have symptoms. Ask your health care provider if a screening pelvic exam is right for you.  If you have had past treatment for cervical cancer or a condition that could lead to cancer, you need Pap tests and screening for cancer for at least 20 years after your treatment. If Pap tests have been discontinued, your risk factors (such as having a new sexual partner) need to be reassessed to determine if screening should resume. Some women have medical problems that increase the chance of getting cervical cancer. In these cases, your health care provider may recommend more frequent screening and Pap tests.  Colorectal Cancer  This type of cancer can be detected and often prevented.  Routine colorectal cancer screening usually begins at 67 years of age and continues through 67 years of age.  Your health care provider may recommend screening at an earlier age if you have risk factors for colon cancer.  Your health care provider may also recommend using home test kits to check for hidden blood in the stool.  A small camera at the end of a  tube can be used to examine your colon directly (sigmoidoscopy or colonoscopy). This is done to check for the earliest forms of colorectal cancer.  Routine screening usually begins at age 19.  Direct examination of the colon should be repeated every 5-10 years through 67 years of age. However, you may need to be screened more often if early forms of precancerous polyps or small growths are found.  Skin Cancer  Check your skin from head to toe regularly.  Tell your health care provider about any new moles or changes in moles, especially if there is a change in a mole's shape or color.  Also tell your health care provider if you have a mole that is larger than the size of a pencil eraser.  Always use sunscreen. Apply sunscreen liberally and repeatedly throughout the day.  Protect yourself by wearing long sleeves, pants, a wide-brimmed hat, and sunglasses whenever you are outside.  Heart disease, diabetes, and high  blood pressure  High blood pressure causes heart disease and increases the risk of stroke. High blood pressure is more likely to develop in: ? People who have blood pressure in the high end of the normal range (130-139/85-89 mm Hg). ? People who are overweight or obese. ? People who are African American.  If you are 67-43 years of age, have your blood pressure checked every 3-5 years. If you are 21 years of age or older, have your blood pressure checked every year. You should have your blood pressure measured twice-once when you are at a hospital or clinic, and once when you are not at a hospital or clinic. Record the average of the two measurements. To check your blood pressure when you are not at a hospital or clinic, you can use: ? An automated blood pressure machine at a pharmacy. ? A home blood pressure monitor.  If you are between 59 years and 27 years old, ask your health care provider if you should take aspirin to prevent strokes.  Have regular diabetes screenings. This  involves taking a blood sample to check your fasting blood sugar level. ? If you are at a normal weight and have a low risk for diabetes, have this test once every three years after 67 years of age. ? If you are overweight and have a high risk for diabetes, consider being tested at a younger age or more often. Preventing infection Hepatitis B  If you have a higher risk for hepatitis B, you should be screened for this virus. You are considered at high risk for hepatitis B if: ? You were born in a country where hepatitis B is common. Ask your health care provider which countries are considered high risk. ? Your parents were born in a high-risk country, and you have not been immunized against hepatitis B (hepatitis B vaccine). ? You have HIV or AIDS. ? You use needles to inject street drugs. ? You live with someone who has hepatitis B. ? You have had sex with someone who has hepatitis B. ? You get hemodialysis treatment. ? You take certain medicines for conditions, including cancer, organ transplantation, and autoimmune conditions.  Hepatitis C  Blood testing is recommended for: ? Everyone born from [birth year] through 1965. ? Anyone with known risk factors for hepatitis C.  Sexually transmitted infections (STIs)  You should be screened for sexually transmitted infections (STIs) including gonorrhea and chlamydia if: ? You are sexually active and are younger than 67 years of age. ? You are older than 67 years of age and your health care provider tells you that you are at risk for this type of infection. ? Your sexual activity has changed since you were last screened and you are at an increased risk for chlamydia or gonorrhea. Ask your health care provider if you are at risk.  If you do not have HIV, but are at risk, it may be recommended that you take a prescription medicine daily to prevent HIV infection. This is called pre-exposure prophylaxis (PrEP). You are considered at risk if: ? You are  sexually active and do not regularly use condoms or know the HIV status of your partner(s). ? You take drugs by injection. ? You are sexually active with a partner who has HIV.  Talk with your health care provider about whether you are at high risk of being infected with HIV. If you choose to begin PrEP, you should first be tested for HIV. You should then be tested  every 3 months for as long as you are taking PrEP. Pregnancy  If you are premenopausal and you may become pregnant, ask your health care provider about preconception counseling.  If you may become pregnant, take 400 to 800 micrograms (mcg) of folic acid every day.  If you want to prevent pregnancy, talk to your health care provider about birth control (contraception). Osteoporosis and menopause  Osteoporosis is a disease in which the bones lose minerals and strength with aging. This can result in serious bone fractures. Your risk for osteoporosis can be identified using a bone density scan.  If you are 65 years of age or older, or if you are at risk for osteoporosis and fractures, ask your health care provider if you should be screened.  Ask your health care provider whether you should take a calcium or vitamin D supplement to lower your risk for osteoporosis.  Menopause may have certain physical symptoms and risks.  Hormone replacement therapy may reduce some of these symptoms and risks. Talk to your health care provider about whether hormone replacement therapy is right for you. Follow these instructions at home:  Schedule regular health, dental, and eye exams.  Stay current with your immunizations.  Do not use any tobacco products including cigarettes, chewing tobacco, or electronic cigarettes.  If you are pregnant, do not drink alcohol.  If you are breastfeeding, limit how much and how often you drink alcohol.  Limit alcohol intake to no more than 1 drink per day for nonpregnant women. One drink equals 12 ounces of  beer, 5 ounces of wine, or 1 ounces of hard liquor.  Do not use street drugs.  Do not share needles.  Ask your health care provider for help if you need support or information about quitting drugs.  Tell your health care provider if you often feel depressed.  Tell your health care provider if you have ever been abused or do not feel safe at home. This information is not intended to replace advice given to you by your health care provider. Make sure you discuss any questions you have with your health care provider. Document Released: 09/30/2010 Document Revised: 08/23/2015 Document Reviewed: 12/19/2014 Elsevier Interactive Patient Education  [employer].

## 2017-11-06 ENCOUNTER — Encounter: Payer: Self-pay | Admitting: Gastroenterology

## 2017-11-06 DIAGNOSIS — E049 Nontoxic goiter, unspecified: Secondary | ICD-10-CM | POA: Diagnosis not present

## 2017-11-06 DIAGNOSIS — E02 Subclinical iodine-deficiency hypothyroidism: Secondary | ICD-10-CM | POA: Diagnosis not present

## 2017-11-11 ENCOUNTER — Telehealth: Payer: Self-pay | Admitting: Family Medicine

## 2017-11-11 ENCOUNTER — Encounter: Payer: Self-pay | Admitting: *Deleted

## 2017-11-11 NOTE — Telephone Encounter (Signed)
Copied from [place] [id number]. Topic: General - Other >> Nov 11, 2017  8:43 AM Reyne Dumas L wrote: Reason for CRM:   Pt needs most recent TSH sent to her endocrinologist (Dr. Chalmers Cater) with [facility].  Pt can be reached at [phone]

## 2017-11-11 NOTE — Telephone Encounter (Signed)
Called and spoke with pt and she is aware of TSH results have been sent over to Dr. Chalmers Cater.

## 2017-11-12 ENCOUNTER — Ambulatory Visit (HOSPITAL_COMMUNITY): Payer: Medicare HMO | Attending: Cardiology

## 2017-11-12 ENCOUNTER — Other Ambulatory Visit: Payer: Self-pay

## 2017-11-12 DIAGNOSIS — E785 Hyperlipidemia, unspecified: Secondary | ICD-10-CM | POA: Diagnosis not present

## 2017-11-12 DIAGNOSIS — Z6841 Body Mass Index (BMI) 40.0 and over, adult: Secondary | ICD-10-CM | POA: Diagnosis not present

## 2017-11-12 DIAGNOSIS — I119 Hypertensive heart disease without heart failure: Secondary | ICD-10-CM | POA: Diagnosis not present

## 2017-11-12 DIAGNOSIS — Z87891 Personal history of nicotine dependence: Secondary | ICD-10-CM | POA: Insufficient documentation

## 2017-11-12 DIAGNOSIS — R002 Palpitations: Secondary | ICD-10-CM | POA: Diagnosis not present

## 2017-11-12 DIAGNOSIS — I10 Essential (primary) hypertension: Secondary | ICD-10-CM

## 2017-11-17 ENCOUNTER — Ambulatory Visit
Admission: RE | Admit: 2017-11-17 | Discharge: 2017-11-17 | Disposition: A | Discharge status: Home / Self Care | Payer: Medicare HMO | Source: Ambulatory Visit | Attending: Obstetrics and Gynecology | Admitting: Obstetrics and Gynecology

## 2017-11-17 DIAGNOSIS — Z1231 Encounter for screening mammogram for malignant neoplasm of breast: Secondary | ICD-10-CM

## 2017-12-02 DIAGNOSIS — R69 Illness, unspecified: Secondary | ICD-10-CM | POA: Diagnosis not present

## 2017-12-13 ENCOUNTER — Encounter (HOSPITAL_COMMUNITY): Payer: Self-pay | Admitting: *Deleted

## 2017-12-13 ENCOUNTER — Ambulatory Visit (HOSPITAL_COMMUNITY)
Admission: EM | Admit: 2017-12-13 | Discharge: 2017-12-13 | Disposition: A | Discharge status: Home / Self Care | Payer: Medicare HMO | Attending: Family Medicine | Admitting: Family Medicine

## 2017-12-13 DIAGNOSIS — L089 Local infection of the skin and subcutaneous tissue, unspecified: Secondary | ICD-10-CM | POA: Diagnosis not present

## 2017-12-13 MED ORDER — DOXYCYCLINE HYCLATE 100 MG PO CAPS: 100.0000 mg | ORAL_CAPSULE | Freq: Two times a day (BID) | ORAL | 0 refills | Status: DC

## 2017-12-13 MED ORDER — BETAMETHASONE DIPROPIONATE 0.05 % EX OINT: TOPICAL_OINTMENT | Freq: Two times a day (BID) | CUTANEOUS | 0 refills | Status: AC

## 2017-12-13 NOTE — Discharge Instructions (Signed)
Follow-up with Dr. Regis Bill to request a referral to podiatry for treatment to thick callus left great toe and irregular growth pattern of left great toe nail.

## 2017-12-13 NOTE — ED Triage Notes (Signed)
C/O right foot pruritis and redness x 1 wk.  Denies pain.

## 2017-12-13 NOTE — ED Provider Notes (Signed)
[facility]    CSN: [id number] Arrival date & time: 12/13/17  1119    History   Chief Complaint Chief Complaint  Patient presents with  . Foot Pain   HPI Amy Stokes is a 67 y.o. female.   Great Toe irritation  Patient reports worsening pruritus of her left great toe.  Reports she is been working on the yard and fears that she has made contact with poison ivy.  Over the course the last 24-hour toe has become increasingly red and pruritic area.  Itching has also expanded across the MCP joints of the left foot.  She denies pain although notes she has occasionally " stumped her toe on on objects" which is possible cause of toe redness. Past Medical History:  Diagnosis Date  . Asthma   . Edema   . Fibroids   . Hyperlipidemia   . Hypertension   . Obesity   . Thyroid nodule 01/2009   left discovered on chest ct; needle bx "indeterminant" cannon r/o follicular neoplasm     Patient Active Problem List   Diagnosis Date Noted  . Cough 08/09/2015  . Iliotibial band syndrome affecting left lower leg 05/26/2014  . Arthritis of left lower extremity 05/26/2014  . Low HDL (under 40) 04/08/2014  . Thyroid nodule 02/12/2014  . OSA (obstructive sleep apnea) 12/01/2013  . Seroma complicating a procedure 08/18/2013  . Bleeding gums 06/09/2011  . ALLERGY 01/31/2010  . MORBID OBESITY 07/16/2009  . Multinodular goiter (nontoxic) 01/13/2008  . RECTAL BLEEDING 11/22/2007  . UNS ADVRS EFF UNS RX MEDICINAL&BIOLOGICAL SBSTNC 07/26/2007  . HYPERLIPIDEMIA 04/06/2007  . HYPERTENSION 03/05/2007  . TACHYCARDIA 03/05/2007  . Asthma 12/28/2006    Past Surgical History:  Procedure Laterality Date  . ABDOMINAL HYSTERECTOMY Bilateral 08/02/2013   Procedure: TOTAL ABDOMINAL HYSTERECTOMY WITH BILATERAL SALPINGO-OOPHORECTOMY ;  Surgeon: Terrance Mass, MD;  Location: [facility];  Service: Gynecology;  Laterality: Bilateral;    Dr. Phineas Real to assist.  Dr. Zella Richer to follow with an  umbilical hernia repair. He will need an additional 1 1/2 hours.   [name] HERNIA REPAIR    . MOUTH SURGERY    . thyroid needle bx    . TONSILLECTOMY    . UMBILICAL HERNIA REPAIR N/A 08/02/2013   Procedure: HERNIA REPAIR UMBILICAL ADULT WITH MESH;  Surgeon: Odis Hollingshead, MD;  Location: [facility];  Service: General;  Laterality: N/A;    OB History    Gravida  1   Para  0   Term      Preterm      AB  1   Living  0     SAB  1   TAB      Ectopic      Multiple      Live Births               Home Medications    Prior to Admission medications   Medication Sig Start Date End Date Taking? Authorizing Provider  ADVAIR DISKUS 100-50 MCG/DOSE AEPB INHALE 1 DOSE BY MOUTH TWICE DAILY. RINSE MOUTH AFTER USE 10/13/17  Yes Panosh, [name], MD  aspirin EC 81 MG tablet Take 81 mg by mouth daily.   Yes [provider]  Cholecalciferol 4000 units CAPS Take by mouth. PATIENT USING OIL AND NOT CAPSULES   Yes [provider]  levothyroxine (SYNTHROID, LEVOTHROID) 88 MCG tablet Take 88 mcg by mouth daily. 09/20/17  Yes [provider]  loratadine (CLARITIN)  10 MG tablet Take 10 mg by mouth daily.   Yes [provider]  losartan (COZAAR) 100 MG tablet TAKE 1 TABLET BY MOUTH EVERY DAY 09/23/17  Yes Panosh, [name], MD  albuterol (PROAIR HFA) 108 (90 BASE) MCG/ACT inhaler Inhale 2 puffs into the lungs every 6 (six) hours as needed for wheezing. 10/12/12   Panosh, [name], MD  betamethasone dipropionate (DIPROLENE) 0.05 % ointment Apply topically 2 (two) times daily for 10 days. Apply directly to skin irritation. 12/13/17 12/23/17  Scot Jun, FNP  doxycycline (VIBRAMYCIN) 100 MG capsule Take 1 capsule (100 mg total) by mouth 2 (two) times daily. 12/13/17   Scot Jun, FNP    Family History Family History  Problem Relation Age of Onset  . Arthritis Mother   . Other Mother        neurological disorder  . Pancreatic cancer Mother   . Heart disease  Father   . Thyroid nodules Sister   . Heart disease Brother        x2 1 brother deceased    Social History Social History   Tobacco Use  . Smoking status: Former Smoker    Packs/day: 1.00    Years: 1.00    Pack years: 1.00    Types: Cigarettes    Last attempt to quit: 04/29/1971    Years since quitting: 46.6  . Smokeless tobacco: Never Used  Substance Use Topics  . Alcohol use: Yes    Alcohol/week: 0.0 standard drinks    Comment: occasionally  . Drug use: No     Allergies   Patient has no known allergies.   Review of Systems Review of Systems Pertinent negatives listed in HPI Physical Exam Triage Vital Signs ED Triage Vitals  Enc Vitals Group     BP 12/13/17 1200 130/64     Pulse Rate 12/13/17 1159 76     Resp 12/13/17 1159 16     Temp 12/13/17 1159 97.9 F (36.6 C)     Temp Source 12/13/17 1159 Oral     SpO2 12/13/17 1159 95 %     Weight --      Height --      Head Circumference --      Peak Flow --      Pain Score 12/13/17 1200 0     Pain Loc --      Pain Edu? --      Excl. in [place]? --    No data found.  Updated Vital Signs BP 130/64   Pulse 76   Temp 97.9 F (36.6 C) (Oral)   Resp 16   SpO2 95%   Visual Acuity Right Eye Distance:   Left Eye Distance:   Bilateral Distance:    Right Eye Near:   Left Eye Near:    Bilateral Near:     Physical Exam General appearance: alert, well developed, well nourished, cooperative and in no distress Head: Normocephalic, without obvious abnormality, atraumatic Respiratory: Respirations even and unlabored, normal respiratory rate Left Great Toe: Erythematous, edematous, irregular growth pattern of great toenail.  Nail discolored yellowish.  Left great toe large callus present. Skin: Skin color, texture, turgor normal. No rashes seen  Psych: Appropriate mood and affect. Neurologic: Mental status: Alert, oriented to person, place, and time, thought content appropriate.  UC Treatments / Results  Labs (all  labs ordered are listed, but only abnormal results are displayed) Labs Reviewed - No data to display  EKG None  Radiology  No results found.  Procedures Procedures (including critical care time)  Medications Ordered in UC Medications - No data to display  Initial Impression / Assessment and Plan / UC Course  I have reviewed the triage vital signs and the nursing notes.  Pertinent labs & imaging results that were available during my care of the patient were reviewed by me and considered in my medical decision making (see chart for details).  Left great toe appearance is consistent with a paronychia. Patient is negative for tenderness with palpation.  Complains of pruritus only.  Will treat empirically with a broad-spectrum antibiotic to resolve infection and also prescribe a topical medium potency steroid cream for itching of the left great toe.  Left great toe nail growth pattern is irregular as well as she has the presence of a massive callus affixed to the posterior region of her left great toe.  Patient may warrant a referral to podiatry for further evaluation of callus and thickened discolored toenail.  Patient verbalized agreement with plan. Final Clinical Impressions(s) / UC Diagnoses   Final diagnoses:  Infection of skin of the great toe     Discharge Instructions     Follow-up with Dr. Regis Bill to request a referral to podiatry for treatment to thick callus left great toe and irregular growth pattern of left great toe nail.     ED Prescriptions    Medication Sig Dispense Auth. Provider   doxycycline (VIBRAMYCIN) 100 MG capsule Take 1 capsule (100 mg total) by mouth 2 (two) times daily. 20 capsule Scot Jun, FNP   betamethasone dipropionate (DIPROLENE) 0.05 % ointment Apply topically 2 (two) times daily for 10 days. Apply directly to skin irritation. 60 g Scot Jun, FNP     Controlled Substance Prescriptions [place] Controlled Substance Registry consulted?  Not Applicable   Scot Jun, [place] 12/14/17 1345

## 2017-12-23 ENCOUNTER — Other Ambulatory Visit: Payer: Self-pay | Admitting: Internal Medicine

## 2017-12-29 DIAGNOSIS — Z6841 Body Mass Index (BMI) 40.0 and over, adult: Secondary | ICD-10-CM | POA: Diagnosis not present

## 2017-12-29 DIAGNOSIS — Z01419 Encounter for gynecological examination (general) (routine) without abnormal findings: Secondary | ICD-10-CM | POA: Diagnosis not present

## 2018-01-21 ENCOUNTER — Other Ambulatory Visit: Payer: Self-pay | Admitting: Internal Medicine

## 2018-01-26 DIAGNOSIS — R69 Illness, unspecified: Secondary | ICD-10-CM | POA: Diagnosis not present

## 2018-04-27 ENCOUNTER — Encounter: Payer: Self-pay | Admitting: Gastroenterology

## 2018-05-03 ENCOUNTER — Ambulatory Visit: Payer: Medicare HMO | Admitting: Pulmonary Disease

## 2018-05-03 ENCOUNTER — Encounter: Payer: Self-pay | Admitting: Pulmonary Disease

## 2018-05-03 VITALS — BP 122/58 | HR 80 | Ht 68.0 in | Wt 304.8 lb

## 2018-05-03 DIAGNOSIS — Z9989 Dependence on other enabling machines and devices: Secondary | ICD-10-CM

## 2018-05-03 DIAGNOSIS — G4733 Obstructive sleep apnea (adult) (pediatric): Secondary | ICD-10-CM | POA: Diagnosis not present

## 2018-05-03 NOTE — Patient Instructions (Signed)
Obstructive sleep apnea-compliant with treatment  No changes to be made to your CPAP at present  I will see you back in the office in about a year Call with any significant concerns

## 2018-05-03 NOTE — Progress Notes (Signed)
Amy Stokes    [id number]    03-18-1951  Primary Care Physician:Panosh, [name], MD  Referring Physician: Burnis Medin, MD [place], [place] [address]  Chief complaint:   Patient with known obstructive sleep apnea Compliant with CPAP use  HPI:  In for establish care She used to see Dr. Gerald Leitz  Very compliant with CPAP use Has no complaints Usually goes to bed about 10 PM, wakes up about 5 AM, wakes up about once at night-no specific reason  Memory is fine No dryness of her mouth Wakes up in the morning feeling like she is at a good nights rest on most days  Weight has been stable  Denies any significant sleep quality issues at present  Reformed smoker quit in 1973  She does use inhaler, rarely uses albuterol  Outpatient Encounter Medications as of 05/03/2018  Medication Sig  . ADVAIR DISKUS 100-50 MCG/DOSE AEPB INHALE 1 DOSE BY MOUTH TWICE DAILY. RINSE MOUTH AFTER USE  . albuterol (PROAIR HFA) 108 (90 BASE) MCG/ACT inhaler Inhale 2 puffs into the lungs every 6 (six) hours as needed for wheezing.  [name] aspirin EC 81 MG tablet Take 81 mg by mouth daily.  . Cholecalciferol 4000 units CAPS Take by mouth. PATIENT USING OIL AND NOT CAPSULES  . levothyroxine (SYNTHROID, LEVOTHROID) 88 MCG tablet Take 88 mcg by mouth daily.  [name] loratadine (CLARITIN) 10 MG tablet Take 10 mg by mouth daily.  [name] losartan (COZAAR) 100 MG tablet TAKE 1 TABLET BY MOUTH EVERY DAY  . [DISCONTINUED] doxycycline (VIBRAMYCIN) 100 MG capsule Take 1 capsule (100 mg total) by mouth 2 (two) times daily.   No facility-administered encounter medications on file as of 05/03/2018.     Allergies as of 05/03/2018  . (No Known Allergies)    Past Medical History:  Diagnosis Date  . Asthma   . Edema   . Fibroids   . Hyperlipidemia   . Hypertension   . Obesity   . Thyroid nodule 01/2009   left discovered on chest ct; needle bx "indeterminant" cannon r/o follicular neoplasm       Past Surgical History:  Procedure Laterality Date  . ABDOMINAL HYSTERECTOMY Bilateral 08/02/2013   Procedure: TOTAL ABDOMINAL HYSTERECTOMY WITH BILATERAL SALPINGO-OOPHORECTOMY ;  Surgeon: Terrance Mass, MD;  Location: [facility];  Service: Gynecology;  Laterality: Bilateral;    Dr. Phineas Real to assist.  Dr. Zella Richer to follow with an umbilical hernia repair. He will need an additional 1 1/2 hours.   [name] HERNIA REPAIR    . MOUTH SURGERY    . thyroid needle bx    . TONSILLECTOMY    . UMBILICAL HERNIA REPAIR N/A 08/02/2013   Procedure: HERNIA REPAIR UMBILICAL ADULT WITH MESH;  Surgeon: Odis Hollingshead, MD;  Location: [facility];  Service: General;  Laterality: N/A;    Family History  Problem Relation Age of Onset  . Arthritis Mother   . Other Mother        neurological disorder  . Pancreatic cancer Mother   . Heart disease Father   . Thyroid nodules Sister   . Heart disease Brother        x2 1 brother deceased    Social History   Socioeconomic History  . Marital status: Married    Spouse name: Not on file  . Number of children: 0  . Years of education: Not on file  . Highest education level: Not on  file  Occupational History  . Occupation: retired  Scientific laboratory technician  . Financial resource strain: Not on file  . Food insecurity:    Worry: Not on file    Inability: Not on file  . Transportation needs:    Medical: Not on file    Non-medical: Not on file  Tobacco Use  . Smoking status: Former Smoker    Packs/day: 1.00    Years: 1.00    Pack years: 1.00    Types: Cigarettes    Last attempt to quit: 04/29/1971    Years since quitting: 47.0  . Smokeless tobacco: Never Used  Substance and Sexual Activity  . Alcohol use: Yes    Alcohol/week: 0.0 standard drinks    Comment: occasionally  . Drug use: No  . Sexual activity: Not on file  Lifestyle  . Physical activity:    Days per week: Not on file    Minutes per session: Not on file  . Stress: Not on file  Relationships  .  Social connections:    Talks on phone: Not on file    Gets together: Not on file    Attends religious service: Not on file    Active member of club or organization: Not on file    Attends meetings of clubs or organizations: Not on file    Relationship status: Not on file  . Intimate partner violence:    Fear of current or ex partner: Not on file    Emotionally abused: Not on file    Physically abused: Not on file    Forced sexual activity: Not on file  Other Topics Concern  . Not on file  Social History Narrative   Married   Occupation: Chiropractor currently unemployed   Married   Regular exercise-no   [place] of 2   Pet cat       Review of Systems  Constitutional: Negative for fatigue.  HENT: Negative.   Eyes: Negative.   Respiratory: Negative.  Negative for shortness of breath.   Cardiovascular: Negative.  Negative for leg swelling.  Gastrointestinal: Negative.   Endocrine: Negative.   Genitourinary: Negative.     Vitals:   05/03/18 1424  BP: (!) 122/58  Pulse: 80  SpO2: 94%     Physical Exam  Constitutional: She appears well-developed and well-nourished.  HENT:  Head: Normocephalic and atraumatic.  Crowded oropharynx, Mallampati 3  Eyes: Pupils are equal, round, and reactive to light. Conjunctivae and EOM are normal. Right eye exhibits no discharge. Left eye exhibits no discharge.  Neck: Normal range of motion. Neck supple. No tracheal deviation present. No thyromegaly present.  Cardiovascular: Normal rate and regular rhythm.  Pulmonary/Chest: Effort normal and breath sounds normal. No respiratory distress. She has no wheezes. She has no rales. She exhibits no tenderness.  Abdominal: Soft. Bowel sounds are normal. She exhibits no distension. There is no abdominal tenderness.  Musculoskeletal:        General: No edema.  Neurological: She is alert.   Results of the Epworth flowsheet 05/03/2018  Sitting and reading 1  Watching TV 1  Sitting, inactive in a  public place (e.g. a theatre or a meeting) 0  As a passenger in a car for an hour without a break 0  Lying down to rest in the afternoon when circumstances permit 0  Sitting and talking to someone 0  Sitting quietly after a lunch without alcohol 0  In a car, while stopped for a few minutes in traffic  0  Total score 2    Data Reviewed: Download from smartcard AHI of 2.2, no significant leak, 100% compliance Auto titrating machine set between 4 and 20  Assessment:  Known obstructive sleep apnea  Compliant with CPAP therapy  Obesity  History of asthma  Plan/Recommendations: Continue CPAP therapy  I will see her back in about a year  Encouraged to call with any significant concerns   Sherrilyn Rist MD [facility] 05/03/2018, 2:26 PM  CC: Panosh, [name], MD

## 2018-05-04 ENCOUNTER — Ambulatory Visit (INDEPENDENT_AMBULATORY_CARE_PROVIDER_SITE_OTHER): Payer: Medicare HMO | Admitting: Internal Medicine

## 2018-05-04 ENCOUNTER — Ambulatory Visit: Payer: Self-pay | Admitting: *Deleted

## 2018-05-04 ENCOUNTER — Encounter: Payer: Self-pay | Admitting: Internal Medicine

## 2018-05-04 VITALS — BP 156/82 | HR 115 | Temp 99.1°F | Wt 302.2 lb

## 2018-05-04 DIAGNOSIS — R6889 Other general symptoms and signs: Secondary | ICD-10-CM

## 2018-05-04 DIAGNOSIS — K529 Noninfective gastroenteritis and colitis, unspecified: Secondary | ICD-10-CM

## 2018-05-04 LAB — POCT INFLUENZA A/B
Influenza A, POC: NEGATIVE
Influenza B, POC: NEGATIVE

## 2018-05-04 NOTE — Patient Instructions (Signed)
I think this is viral infection that will run its course  But  Stay hydrated   Let us know if need med for nausea .  Fever should be resolved or minimal in another 48 hours . Contact if dehydration.  Severe pain.   Unusual rash etc .      Viral Gastroenteritis, Adult  Viral gastroenteritis is also known as the stomach flu. This condition is caused by various viruses. These viruses can be passed from person to person very easily (are very contagious). This condition may affect your stomach, small intestine, and large intestine. It can cause sudden watery diarrhea, fever, and vomiting. Diarrhea and vomiting can make you feel weak and cause you to become dehydrated. You may not be able to keep fluids down. Dehydration can make you tired and thirsty, cause you to have a dry mouth, and decrease how often you urinate. Older adults and people with other diseases or a weak immune system are at higher risk for dehydration. It is important to replace the fluids that you lose from diarrhea and vomiting. If you become severely dehydrated, you may need to get fluids through an IV tube. What are the causes? Gastroenteritis is caused by various viruses, including rotavirus and norovirus. Norovirus is the most common cause in adults. You can get sick by eating food, drinking water, or touching a surface contaminated with one of these viruses. You can also get sick from sharing utensils or other personal items with an infected person. What increases the risk? This condition is more likely to develop in people:  Who have a weak defense system (immune system).  Who live with one or more children who are younger than 24 years old.  Who live in a nursing home.  Who go on cruise ships. What are the signs or symptoms? Symptoms of this condition start suddenly 1-2 days after exposure to a virus. Symptoms may last a few days or as long as a week. The most common symptoms are watery diarrhea and vomiting. Other  symptoms include:  Fever.  Headache.  Fatigue.  Pain in the abdomen.  Chills.  Weakness.  Nausea.  Muscle aches.  Loss of appetite. How is this diagnosed? This condition is diagnosed with a medical history and physical exam. You may also have a stool test to check for viruses or other infections. How is this treated? This condition typically goes away on its own. The focus of treatment is to restore lost fluids (rehydration). Your health care provider may recommend that you take an oral rehydration solution (ORS) to replace important salts and minerals (electrolytes) in your body. Severe cases of this condition may require giving fluids through an IV tube. Treatment may also include medicine to help with your symptoms. Follow these instructions at home:  Follow instructions from your health care provider about how to care for yourself at home. Follow these recommendations as told by your health care provider:  Take an ORS. This is a drink that is sold at pharmacies and retail stores.  Drink clear fluids in small amounts as you are able. Clear fluids include water, ice chips, diluted fruit juice, and low-calorie sports drinks.  Eat bland, easy-to-digest foods in small amounts as you are able. These foods include bananas, applesauce, rice, lean meats, toast, and crackers.  Avoid fluids that contain a lot of sugar or caffeine, such as energy drinks, sports drinks, and soda.  Avoid alcohol.  Avoid spicy or fatty foods. General instructions  Drink enough  fluid to keep your urine clear or pale yellow.  Wash your hands often. If soap and water are not available, use hand sanitizer.  Make sure that all people in your household wash their hands well and often.  Take over-the-counter and prescription medicines only as told by your health care provider.  Rest at home while you recover.  Watch your condition for any changes.  Take a warm bath to relieve any burning or pain  from frequent diarrhea episodes.  Keep all follow-up visits as told by your health care provider. This is important. Contact a health care provider if:  You cannot keep fluids down.  Your symptoms get worse.  You have new symptoms.  You feel light-headed or dizzy.  You have muscle cramps. Get help right away if:  You have chest pain.  You feel extremely weak or you faint.  You see blood in your vomit.  Your vomit looks like coffee grounds.  You have bloody or black stools or stools that look like tar.  You have a severe headache, a stiff neck, or both.  You have a rash.  You have severe pain, cramping, or bloating in your abdomen.  You have trouble breathing or you are breathing very quickly.  Your heart is beating very quickly.  Your skin feels cold and clammy.  You feel confused.  You have pain when you urinate.  You have signs of dehydration, such as: ? Dark urine, very little urine, or no urine. ? Cracked lips. ? Dry mouth. ? Sunken eyes. ? Sleepiness. ? Weakness. This information is not intended to replace advice given to you by your health care provider. Make sure you discuss any questions you have with your health care provider. Document Released: 03/17/2005 Document Revised: 10/30/2016 Document Reviewed: 11/21/2014 Elsevier Interactive Patient Education  2019 [employer].

## 2018-05-04 NOTE — Progress Notes (Signed)
Chief Complaint  Patient presents with  . flu-like symptoms    started at 2:30 this morning with fever, diarrhea, vomiting twice so far, no chills, headache, or nasal drainage , Pt take a baby asprin and states her fever was at 102 at 10 am this morning     HPI: Amy Stokes 68 y.o. come in for C acute patient visit.  Yesterday felt well when  Was at [place] .  Her blood pressure was normal at this time.  And she had the acute onset of above symptoms fever of 102 vomiting without significant abdominal pain and then diarrhea.       No resp sx some gatorade   Today and meds .  No specific exposures was at a family [employer] party No cough wheezing runny nose unusual rashes.  No recent significant travel. ROS: See pertinent positives and negatives per HPI.  Past Medical History:  Diagnosis Date  . Asthma   . Edema   . Fibroids   . Hyperlipidemia   . Hypertension   . Obesity   . Thyroid nodule 01/2009   left discovered on chest ct; needle bx "indeterminant" cannon r/o follicular neoplasm     Family History  Problem Relation Age of Onset  . Arthritis Mother   . Other Mother        neurological disorder  . Pancreatic cancer Mother   . Heart disease Father   . Thyroid nodules Sister   . Heart disease Brother        x2 1 brother deceased    Social History   Socioeconomic History  . Marital status: Married    Spouse name: Not on file  . Number of children: 0  . Years of education: Not on file  . Highest education level: Not on file  Occupational History  . Occupation: retired  Scientific laboratory technician  . Financial resource strain: Not on file  . Food insecurity:    Worry: Not on file    Inability: Not on file  . Transportation needs:    Medical: Not on file    Non-medical: Not on file  Tobacco Use  . Smoking status: Former Smoker    Packs/day: 1.00    Years: 1.00    Pack years: 1.00    Types: Cigarettes    Last attempt to quit: 04/29/1971    Years since  quitting: 47.0  . Smokeless tobacco: Never Used  Substance and Sexual Activity  . Alcohol use: Yes    Alcohol/week: 0.0 standard drinks    Comment: occasionally  . Drug use: No  . Sexual activity: Not on file  Lifestyle  . Physical activity:    Days per week: Not on file    Minutes per session: Not on file  . Stress: Not on file  Relationships  . Social connections:    Talks on phone: Not on file    Gets together: Not on file    Attends religious service: Not on file    Active member of club or organization: Not on file    Attends meetings of clubs or organizations: Not on file    Relationship status: Not on file  Other Topics Concern  . Not on file  Social History Narrative   Married   Occupation: Chiropractor currently unemployed   Married   Regular exercise-no   [place] of 2   Pet cat       Outpatient Medications Prior to Visit  Medication Sig Dispense Refill  . ADVAIR DISKUS 100-50 MCG/DOSE AEPB INHALE 1 DOSE BY MOUTH TWICE DAILY. RINSE MOUTH AFTER USE 180 each 1  . albuterol (PROAIR HFA) 108 (90 BASE) MCG/ACT inhaler Inhale 2 puffs into the lungs every 6 (six) hours as needed for wheezing. 1 Inhaler 2  . aspirin EC 81 MG tablet Take 81 mg by mouth daily.    . Cholecalciferol 4000 units CAPS Take by mouth. PATIENT USING OIL AND NOT CAPSULES    . levothyroxine (SYNTHROID, LEVOTHROID) 88 MCG tablet Take 88 mcg by mouth daily.  11  . loratadine (CLARITIN) 10 MG tablet Take 10 mg by mouth daily.    [name] losartan (COZAAR) 100 MG tablet TAKE 1 TABLET BY MOUTH EVERY DAY 90 tablet 2   No facility-administered medications prior to visit.      EXAM:  BP (!) 156/82 (BP Location: Right Arm, Patient Position: Sitting, Cuff Size: Large)   Pulse (!) 115   Temp 99.1 F (37.3 C) (Oral)   Wt (!) 302 lb 3.2 oz (137.1 kg)   SpO2 94%   BMI 45.95 kg/m   Body mass index is 45.95 kg/m.  GENERAL: vitals reviewed and listed above, alert, oriented, appears well hydrated and in no  acute distress non toxic  HEENT: atraumatic, conjunctiva  clear, no obvious abnormalities on inspection of external nose and ears TMs no acute findings OP : no lesion edema or exudate moist mucous membranes NECK: no obvious masses on inspection palpation  LUNGS: clear to auscultation bilaterally, no wheezes, rales or rhonchiCV: HRRR, no clubbing cyanosis or  peripheral edema nl cap refill  Abdomen soft without organomegaly guarding or rebound. Skin no acute rashes normal capillary refill. PSYCH: pleasant and cooperative,  Lab Results  Component Value Date   WBC 7.0 11/04/2017   HGB 12.2 11/04/2017   HCT 37.4 11/04/2017   PLT 249.0 11/04/2017   GLUCOSE 91 11/04/2017   CHOL 186 11/04/2017   TRIG 87.0 11/04/2017   HDL 40.00 11/04/2017   LDLDIRECT 141.7 04/03/2011   LDLCALC 128 (H) 11/04/2017   ALT 14 11/04/2017   AST 11 11/04/2017   NA 139 11/04/2017   K 4.1 11/04/2017   CL 101 11/04/2017   CREATININE 0.63 11/04/2017   BUN 10 11/04/2017   CO2 33 (H) 11/04/2017   TSH 1.95 11/04/2017   INR 0.98 08/02/2013   HGBA1C 6.0 11/04/2017   BP Readings from Last 3 Encounters:  05/04/18 (!) 156/82  05/03/18 (!) 122/58  12/13/17 130/64   Rapid flu screen negative. ASSESSMENT AND PLAN:  Discussed the following assessment and plan:  Gastroenteritis, infectious, presumed  Flu-like symptoms - Plan: POC Influenza A/B Most likely acute viral gastroenteritis no alarm findings on exam expectant management discussed reason for follow-up severe pain persistent fever hydration issues etc. Patient declined Zofran today but will call if needed.  She feels that her vomiting has stopped.  -Patient advised to return or notify health care team  if  new concerns arise.  Patient Instructions  I think this is viral infection that will run its course  But  Stay hydrated   Let us know if need med for nausea .  Fever should be resolved or minimal in another 48 hours . Contact if dehydration.  Severe  pain.   Unusual rash etc .      Viral Gastroenteritis, Adult  Viral gastroenteritis is also known as the stomach flu. This condition is caused by various viruses. These viruses can be  passed from person to person very easily (are very contagious). This condition may affect your stomach, small intestine, and large intestine. It can cause sudden watery diarrhea, fever, and vomiting. Diarrhea and vomiting can make you feel weak and cause you to become dehydrated. You may not be able to keep fluids down. Dehydration can make you tired and thirsty, cause you to have a dry mouth, and decrease how often you urinate. Older adults and people with other diseases or a weak immune system are at higher risk for dehydration. It is important to replace the fluids that you lose from diarrhea and vomiting. If you become severely dehydrated, you may need to get fluids through an IV tube. What are the causes? Gastroenteritis is caused by various viruses, including rotavirus and norovirus. Norovirus is the most common cause in adults. You can get sick by eating food, drinking water, or touching a surface contaminated with one of these viruses. You can also get sick from sharing utensils or other personal items with an infected person. What increases the risk? This condition is more likely to develop in people:  Who have a weak defense system (immune system).  Who live with one or more children who are younger than 58 years old.  Who live in a nursing home.  Who go on cruise ships. What are the signs or symptoms? Symptoms of this condition start suddenly 1-2 days after exposure to a virus. Symptoms may last a few days or as long as a week. The most common symptoms are watery diarrhea and vomiting. Other symptoms include:  Fever.  Headache.  Fatigue.  Pain in the abdomen.  Chills.  Weakness.  Nausea.  Muscle aches.  Loss of appetite. How is this diagnosed? This condition is diagnosed with a  medical history and physical exam. You may also have a stool test to check for viruses or other infections. How is this treated? This condition typically goes away on its own. The focus of treatment is to restore lost fluids (rehydration). Your health care provider may recommend that you take an oral rehydration solution (ORS) to replace important salts and minerals (electrolytes) in your body. Severe cases of this condition may require giving fluids through an IV tube. Treatment may also include medicine to help with your symptoms. Follow these instructions at home:  Follow instructions from your health care provider about how to care for yourself at home. Follow these recommendations as told by your health care provider:  Take an ORS. This is a drink that is sold at pharmacies and retail stores.  Drink clear fluids in small amounts as you are able. Clear fluids include water, ice chips, diluted fruit juice, and low-calorie sports drinks.  Eat bland, easy-to-digest foods in small amounts as you are able. These foods include bananas, applesauce, rice, lean meats, toast, and crackers.  Avoid fluids that contain a lot of sugar or caffeine, such as energy drinks, sports drinks, and soda.  Avoid alcohol.  Avoid spicy or fatty foods. General instructions  Drink enough fluid to keep your urine clear or pale yellow.  Wash your hands often. If soap and water are not available, use hand sanitizer.  Make sure that all people in your household wash their hands well and often.  Take over-the-counter and prescription medicines only as told by your health care provider.  Rest at home while you recover.  Watch your condition for any changes.  Take a warm bath to relieve any burning or pain from frequent  diarrhea episodes.  Keep all follow-up visits as told by your health care provider. This is important. Contact a health care provider if:  You cannot keep fluids down.  Your symptoms get  worse.  You have new symptoms.  You feel light-headed or dizzy.  You have muscle cramps. Get help right away if:  You have chest pain.  You feel extremely weak or you faint.  You see blood in your vomit.  Your vomit looks like coffee grounds.  You have bloody or black stools or stools that look like tar.  You have a severe headache, a stiff neck, or both.  You have a rash.  You have severe pain, cramping, or bloating in your abdomen.  You have trouble breathing or you are breathing very quickly.  Your heart is beating very quickly.  Your skin feels cold and clammy.  You feel confused.  You have pain when you urinate.  You have signs of dehydration, such as: ? Dark urine, very little urine, or no urine. ? Cracked lips. ? Dry mouth. ? Sunken eyes. ? Sleepiness. ? Weakness. This information is not intended to replace advice given to you by your health care provider. Make sure you discuss any questions you have with your health care provider. Document Released: 03/17/2005 Document Revised: 10/30/2016 Document Reviewed: 11/21/2014 Elsevier Interactive Patient Education  2019 [place] K. Panosh M.D.

## 2018-05-04 NOTE — Telephone Encounter (Signed)
Patient called in and stated around 2:30am she began to have vomiting and a small loose stool and a temperature of 102.8  Reason for Disposition . [1] Fever > 101 F (38.3 C) AND [2] age > 76  Answer Assessment - Initial Assessment Questions 1. TEMPERATURE: "What is the most recent temperature?"  "How was it measured?"      102.8- 10:00, oral 2. ONSET: "When did the fever start?"      This morning- patient took it 4-5 am and it was 100.0 3. SYMPTOMS: "Do you have any other symptoms besides the fever?"  (e.g., colds, headache, sore throat, earache, cough, rash, diarrhea, vomiting, abdominal pain)     Loose stool, vomiting 4. CAUSE: If there are no symptoms, ask: "What do you think is causing the fever?"      No exposure that she is aware 5. CONTACTS: "Does anyone else in the family have an infection?"     no 6. TREATMENT: "What have you done so far to treat this fever?" (e.g., medications)     Part of daily medication 81 mg aspirin  7. IMMUNOCOMPROMISE: "Do you have of the following: diabetes, HIV positive, splenectomy, cancer chemotherapy, chronic steroid treatment, transplant patient, etc."     no 8. PREGNANCY: "Is there any chance you are pregnant?" "When was your last menstrual period?"     n/a 9. TRAVEL: "Have you traveled out of the country in the last month?" (e.g., travel history, exposures)     No travel  Protocols used: FEVER-A-AH

## 2018-05-17 ENCOUNTER — Encounter: Payer: Self-pay | Admitting: Gastroenterology

## 2018-05-17 ENCOUNTER — Ambulatory Visit (AMBULATORY_SURGERY_CENTER): Payer: Self-pay | Admitting: *Deleted

## 2018-05-17 VITALS — Ht 68.0 in | Wt 305.8 lb

## 2018-05-17 DIAGNOSIS — Z1211 Encounter for screening for malignant neoplasm of colon: Secondary | ICD-10-CM

## 2018-05-17 MED ORDER — PEG 3350-KCL-NA BICARB-NACL 420 G PO SOLR: 4000.0000 mL | Freq: Once | ORAL | 0 refills | Status: AC

## 2018-05-17 NOTE — Progress Notes (Signed)
No egg or soy allergy known to patient  No issues with past sedation with any surgeries  or procedures, no intubation problems  No diet pills per patient No home 02 use per patient  No blood thinners per patient  Pt denies issues with constipation  No A fib or A flutter  EMMI video sent to pt's e mail pt declined   

## 2018-05-28 ENCOUNTER — Ambulatory Visit (AMBULATORY_SURGERY_CENTER): Payer: Medicare HMO | Admitting: Gastroenterology

## 2018-05-28 ENCOUNTER — Encounter: Payer: Self-pay | Admitting: Gastroenterology

## 2018-05-28 VITALS — BP 144/68 | HR 79 | Resp 19 | Ht 68.0 in | Wt 302.0 lb

## 2018-05-28 DIAGNOSIS — Z1211 Encounter for screening for malignant neoplasm of colon: Secondary | ICD-10-CM

## 2018-05-28 DIAGNOSIS — D120 Benign neoplasm of cecum: Secondary | ICD-10-CM

## 2018-05-28 MED ORDER — SODIUM CHLORIDE 0.9 % IV SOLN: 500.0000 mL | Freq: Once | INTRAVENOUS | Status: DC

## 2018-05-28 NOTE — Progress Notes (Signed)
PT taken to PACU. Monitors in place. VSS. Report given to RN. 

## 2018-05-28 NOTE — Op Note (Signed)
[facility] Patient Name: Amy Stokes Procedure Date: 05/28/2018 1:28 PM MRN: [id number] Endoscopist: Mauri Pole , MD Age: 68 Referring MD:  Date of Birth: [birth year] Gender: Female Account #: [id number] Procedure:                Colonoscopy Indications:              Screening for colorectal malignant neoplasm Medicines:                Monitored Anesthesia Care Procedure:                Pre-Anesthesia Assessment:                           - Prior to the procedure, a History and Physical                            was performed, and patient medications and                            allergies were reviewed. The patient's tolerance of                            previous anesthesia was also reviewed. The risks                            and benefits of the procedure and the sedation                            options and risks were discussed with the patient.                            All questions were answered, and informed consent                            was obtained. Prior Anticoagulants: The patient has                            taken no previous anticoagulant or antiplatelet                            agents. ASA Grade Assessment: III - A patient with                            severe systemic disease. After reviewing the risks                            and benefits, the patient was deemed in                            satisfactory condition to undergo the procedure.                           After obtaining informed consent, the colonoscope  was passed under direct vision. Throughout the                            procedure, the patient's blood pressure, pulse, and                            oxygen saturations were monitored continuously. The                            Colonoscope was introduced through the anus and                            advanced to the the cecum, identified by                            appendiceal  orifice and ileocecal valve. The                            colonoscopy was performed without difficulty. The                            patient tolerated the procedure well. The quality                            of the bowel preparation was good. The ileocecal                            valve, appendiceal orifice, and rectum were                            photographed. Scope In: 1:32:31 PM Scope Out: 1:46:30 PM Scope Withdrawal Time: 0 hours 10 minutes 23 seconds  Total Procedure Duration: 0 hours 13 minutes 59 seconds  Findings:                 The perianal and digital rectal examinations were                            normal.                           A 3 mm polyp was found in the appendiceal orifice.                            The polyp was sessile. The polyp was removed with a                            cold snare. Resection and retrieval were complete.                           Multiple small and large-mouthed diverticula were                            found in the sigmoid colon, descending colon,  transverse colon, ascending colon and cecum.                           Non-bleeding internal hemorrhoids were found during                            retroflexion. The hemorrhoids were small. Complications:            No immediate complications. Estimated Blood Loss:     Estimated blood loss was minimal. Impression:               - One 3 mm polyp at the appendiceal orifice,                            removed with a cold snare. Resected and retrieved.                           - Moderate diverticulosis in the sigmoid colon, in                            the descending colon, in the transverse colon, in                            the ascending colon and in the cecum.                           - Non-bleeding internal hemorrhoids. Recommendation:           - Patient has a contact number available for                            emergencies. The signs and symptoms  of potential                            delayed complications were discussed with the                            patient. Return to normal activities tomorrow.                            Written discharge instructions were provided to the                            patient.                           - Resume previous diet.                           - Continue present medications.                           - Await pathology results.                           - Repeat colonoscopy in 5-10 years for surveillance  based on pathology results. Mauri Pole, MD 05/28/2018 1:58:00 PM This report has been signed electronically.

## 2018-05-28 NOTE — Progress Notes (Signed)
Called to room to assist during endoscopic procedure.  Patient ID and intended procedure confirmed with present staff. Received instructions for my participation in the procedure from the performing physician.  

## 2018-05-31 ENCOUNTER — Telehealth: Payer: Self-pay

## 2018-05-31 NOTE — Telephone Encounter (Signed)
  Follow up Call-  Call back number 05/28/2018  Post procedure Call Back phone  # [phone]  Permission to leave phone message Yes  Some recent data might be hidden     Patient questions:  Do you have a fever, pain , or abdominal swelling? No. Pain Score  0 *  Have you tolerated food without any problems? Yes.    Have you been able to return to your normal activities? Yes.    Do you have any questions about your discharge instructions: Diet   No. Medications  No. Follow up visit  No.  Do you have questions or concerns about your Care? No.  Actions: * If pain score is 4 or above: No action needed, pain <4.

## 2018-06-03 DIAGNOSIS — H2513 Age-related nuclear cataract, bilateral: Secondary | ICD-10-CM | POA: Diagnosis not present

## 2018-06-03 DIAGNOSIS — H35033 Hypertensive retinopathy, bilateral: Secondary | ICD-10-CM | POA: Diagnosis not present

## 2018-06-03 DIAGNOSIS — H35362 Drusen (degenerative) of macula, left eye: Secondary | ICD-10-CM | POA: Diagnosis not present

## 2018-06-03 DIAGNOSIS — H31101 Choroidal degeneration, unspecified, right eye: Secondary | ICD-10-CM | POA: Diagnosis not present

## 2018-06-07 ENCOUNTER — Other Ambulatory Visit: Payer: Self-pay | Admitting: Internal Medicine

## 2018-06-07 MED ORDER — LOSARTAN POTASSIUM 100 MG PO TABS: 100.0000 mg | ORAL_TABLET | Freq: Every day | ORAL | 1 refills | Status: DC

## 2018-06-07 NOTE — Telephone Encounter (Signed)
Copied from [facility] [id number]. Topic: Quick Communication - Rx Refill/Question >> Jun 07, 2018 11:05 AM Blase Mess A wrote: Medication: losartan (COZAAR) 100 MG tablet [[id number]]   Has the patient contacted their pharmacy? Yes  (Agent: If no, request that the patient contact the pharmacy for the refill.) (Agent: If yes, when and what did the pharmacy advise?)  Preferred Pharmacy (with phone number or street name): [facility] [place] [address] [phone]  Agent: Please be advised that RX refills may take up to 3 business days. We ask that you follow-up with your pharmacy.

## 2018-06-07 NOTE — Telephone Encounter (Signed)
Pharmacy requesting to change due to losartan being on back order. Please advise

## 2018-06-07 NOTE — Addendum Note (Signed)
Addended by: Alan Ripper on: 06/07/2018 03:12 PM   Modules accepted: Orders

## 2018-06-08 NOTE — Telephone Encounter (Signed)
Ok to send in ibesartan 300 mg 1 po qd disp 30 and refill x 5  Please update med list

## 2018-06-10 ENCOUNTER — Encounter: Payer: Self-pay | Admitting: Gastroenterology

## 2018-06-16 ENCOUNTER — Other Ambulatory Visit: Payer: Self-pay | Admitting: Internal Medicine

## 2018-08-01 ENCOUNTER — Other Ambulatory Visit: Payer: Self-pay | Admitting: Internal Medicine

## 2018-09-09 DIAGNOSIS — Z0100 Encounter for examination of eyes and vision without abnormal findings: Secondary | ICD-10-CM | POA: Diagnosis not present

## 2018-10-29 DIAGNOSIS — E02 Subclinical iodine-deficiency hypothyroidism: Secondary | ICD-10-CM | POA: Diagnosis not present

## 2018-11-05 DIAGNOSIS — E02 Subclinical iodine-deficiency hypothyroidism: Secondary | ICD-10-CM | POA: Diagnosis not present

## 2018-11-05 DIAGNOSIS — I10 Essential (primary) hypertension: Secondary | ICD-10-CM | POA: Diagnosis not present

## 2018-11-05 DIAGNOSIS — E049 Nontoxic goiter, unspecified: Secondary | ICD-10-CM | POA: Diagnosis not present

## 2018-11-18 ENCOUNTER — Other Ambulatory Visit: Payer: Self-pay | Admitting: Obstetrics and Gynecology

## 2018-11-18 DIAGNOSIS — Z1231 Encounter for screening mammogram for malignant neoplasm of breast: Secondary | ICD-10-CM

## 2019-01-03 ENCOUNTER — Other Ambulatory Visit: Payer: Self-pay

## 2019-01-03 ENCOUNTER — Ambulatory Visit
Admission: RE | Admit: 2019-01-03 | Discharge: 2019-01-03 | Disposition: A | Discharge status: Home / Self Care | Payer: Medicare HMO | Source: Ambulatory Visit | Attending: Obstetrics and Gynecology | Admitting: Obstetrics and Gynecology

## 2019-01-03 DIAGNOSIS — Z1231 Encounter for screening mammogram for malignant neoplasm of breast: Secondary | ICD-10-CM | POA: Diagnosis not present

## 2019-01-23 ENCOUNTER — Other Ambulatory Visit: Payer: Self-pay | Admitting: Internal Medicine

## 2019-02-03 ENCOUNTER — Other Ambulatory Visit: Payer: Self-pay | Admitting: Internal Medicine

## 2019-02-04 DIAGNOSIS — E02 Subclinical iodine-deficiency hypothyroidism: Secondary | ICD-10-CM | POA: Diagnosis not present

## 2019-02-14 ENCOUNTER — Telehealth: Payer: Self-pay | Admitting: Internal Medicine

## 2019-02-14 NOTE — Telephone Encounter (Signed)
Noted  

## 2019-02-14 NOTE — Telephone Encounter (Signed)
Pt would like her provider to know that she is in a study for covid vaccine. Conservator, museum/gallery study  [employer]

## 2019-02-14 NOTE — Telephone Encounter (Signed)
fyi

## 2019-02-16 DIAGNOSIS — R69 Illness, unspecified: Secondary | ICD-10-CM | POA: Diagnosis not present

## 2019-02-18 DIAGNOSIS — Z6841 Body Mass Index (BMI) 40.0 and over, adult: Secondary | ICD-10-CM | POA: Diagnosis not present

## 2019-02-18 DIAGNOSIS — N958 Other specified menopausal and perimenopausal disorders: Secondary | ICD-10-CM | POA: Diagnosis not present

## 2019-02-18 DIAGNOSIS — L292 Pruritus vulvae: Secondary | ICD-10-CM | POA: Diagnosis not present

## 2019-02-18 DIAGNOSIS — Z124 Encounter for screening for malignant neoplasm of cervix: Secondary | ICD-10-CM | POA: Diagnosis not present

## 2019-02-25 ENCOUNTER — Other Ambulatory Visit: Payer: Self-pay | Admitting: Internal Medicine

## 2019-03-25 ENCOUNTER — Other Ambulatory Visit: Payer: Self-pay | Admitting: Internal Medicine

## 2019-03-30 ENCOUNTER — Other Ambulatory Visit: Payer: Self-pay | Admitting: Endocrinology

## 2019-03-30 DIAGNOSIS — E049 Nontoxic goiter, unspecified: Secondary | ICD-10-CM

## 2019-04-19 ENCOUNTER — Other Ambulatory Visit: Payer: Self-pay | Admitting: Internal Medicine

## 2019-04-26 ENCOUNTER — Telehealth: Payer: Self-pay | Admitting: Internal Medicine

## 2019-04-26 NOTE — Telephone Encounter (Signed)
Patient is in the [employer] Covid vaccine study.  Patient has been fully immunized since 12/15/18 and wants that to go in her chart.

## 2019-04-26 NOTE — Telephone Encounter (Signed)
This has been updated.

## 2019-05-13 ENCOUNTER — Other Ambulatory Visit: Payer: Self-pay | Admitting: Internal Medicine

## 2019-06-06 ENCOUNTER — Other Ambulatory Visit: Payer: Self-pay | Admitting: Internal Medicine

## 2019-06-06 ENCOUNTER — Encounter: Payer: Self-pay | Admitting: Internal Medicine

## 2019-06-06 DIAGNOSIS — H2513 Age-related nuclear cataract, bilateral: Secondary | ICD-10-CM | POA: Diagnosis not present

## 2019-06-06 DIAGNOSIS — H35013 Changes in retinal vascular appearance, bilateral: Secondary | ICD-10-CM | POA: Diagnosis not present

## 2019-06-06 DIAGNOSIS — H35033 Hypertensive retinopathy, bilateral: Secondary | ICD-10-CM | POA: Diagnosis not present

## 2019-06-06 DIAGNOSIS — H524 Presbyopia: Secondary | ICD-10-CM | POA: Diagnosis not present

## 2019-06-06 DIAGNOSIS — H35363 Drusen (degenerative) of macula, bilateral: Secondary | ICD-10-CM | POA: Diagnosis not present

## 2019-06-09 ENCOUNTER — Other Ambulatory Visit: Payer: Self-pay

## 2019-06-09 NOTE — Progress Notes (Signed)
This visit occurred during the SARS-CoV-2 public health emergency.  Safety protocols were in place, including screening questions prior to the visit, additional usage of staff PPE, and extensive cleaning of exam room while observing appropriate contact time as indicated for disinfecting solutions.    Chief Complaint  Patient presents with  . Annual Exam    Pt is concerned about her wheezing     HPI: Amy Stokes 69 y.o. come in for Chronic disease management  And physical  Last seen by me over a year ago   BP had to switch from avapro for formulary restrictions  Now on losartan  She sees dr Chalmers Cater for thyroid disease  S/p i131 ablation  Eye noted to have hypertesnive retinopty Has gyne  Has osa on cpap  About 6-8 hours  Was in the [place] vaccine study  Aug and sept no sx  fuf ro 2 years in study  Some wheezing when in gyme taking inhaler  But no change in exercise tolerance Weight up some in past year  prob dietary some   hh of 2  No pets  TAD:  Rare mod etoh Has had both pna vaccine  Health Maintenance  Topic Date Due  . Hepatitis C Screening  Never done  . PNA vac Low Risk Adult (2 of 2 - PPSV23) 04/03/2017  . PAP SMEAR-Modifier  06/10/2019 (Originally 09/29/2015)  . MAMMOGRAM  01/03/2020  . TETANUS/TDAP  07/29/2023  . COLONOSCOPY  05/28/2028  . INFLUENZA VACCINE  Completed  . DEXA SCAN  Completed     ROS: See pertinent positives and negatives per HPI. ocass wheezy goes to gyme doing ok  No cp  Sob falling    Past Medical History:  Diagnosis Date  . Arthritis    no meds  . Asthma   . Edema   . Fibroids   . Hyperlipidemia   . Hypertension   . Obesity   . Sleep apnea    wears cpap   . Thyroid nodule 01/2009   left discovered on chest ct; needle bx "indeterminant" cannon r/o follicular neoplasm     Family History  Problem Relation Age of Onset  . Arthritis Mother   . Other Mother        neurological disorder  . Pancreatic cancer Mother   . Heart disease  Father   . Thyroid nodules Sister   . Heart disease Brother        x2 1 brother deceased  . Colon cancer Neg Hx   . Colon polyps Neg Hx   . Esophageal cancer Neg Hx   . Rectal cancer Neg Hx   . Stomach cancer Neg Hx     Social History   Socioeconomic History  . Marital status: Married    Spouse name: Not on file  . Number of children: 0  . Years of education: Not on file  . Highest education level: Not on file  Occupational History  . Occupation: retired  Tobacco Use  . Smoking status: Former Smoker    Packs/day: 1.00    Years: 1.00    Pack years: 1.00    Types: Cigarettes    Quit date: 04/29/1971    Years since quitting: 48.1  . Smokeless tobacco: Never Used  Substance and Sexual Activity  . Alcohol use: Yes    Alcohol/week: 0.0 standard drinks    Comment: occasionally  . Drug use: No  . Sexual activity: Not on file  Other Topics Concern  .  Not on file  Social History Narrative   Married   Occupation: Chiropractor currently unemployed   Married   Regular exercise-no   [place] of 2   Pet cat      Social Determinants of Radio broadcast assistant Strain:   . Difficulty of Paying Living Expenses:   Food Insecurity:   . Worried About Charity fundraiser in the Last Year:   . Arboriculturist in the Last Year:   Transportation Needs:   . Film/video editor (Medical):   [name] Lack of Transportation (Non-Medical):   Physical Activity:   . Days of Exercise per Week:   . Minutes of Exercise per Session:   Stress:   . Feeling of Stress :   Social Connections:   . Frequency of Communication with Friends and Family:   . Frequency of Social Gatherings with Friends and Family:   . Attends Religious Services:   . Active Member of Clubs or Organizations:   . Attends Archivist Meetings:   [name] Marital Status:     Outpatient Medications Prior to Visit  Medication Sig Dispense Refill  . ADVAIR DISKUS 100-50 MCG/DOSE AEPB TAKE 1 PUFF BY MOUTH TWICE A DAY  *RINSE MOUTH AFTER USE* 180 each 1  . albuterol (PROAIR HFA) 108 (90 BASE) MCG/ACT inhaler Inhale 2 puffs into the lungs every 6 (six) hours as needed for wheezing. 1 Inhaler 2  . aspirin EC 81 MG tablet Take 81 mg by mouth daily.    . Cholecalciferol 4000 units CAPS Take by mouth. PATIENT USING OIL AND NOT CAPSULES    . levothyroxine (SYNTHROID, LEVOTHROID) 88 MCG tablet Take 88 mcg by mouth daily.  11  . loratadine (CLARITIN) 10 MG tablet Take 10 mg by mouth daily.    [name] losartan (COZAAR) 100 MG tablet TAKE 1 TABLET (100 MG TOTAL) BY MOUTH DAILY. NEEDS OFFICE VISIT FOR FURTHER REFILLS 30 tablet 0  . irbesartan (AVAPRO) 300 MG tablet Take 1 tablet (300 mg total) by mouth daily. (Patient not taking: Reported on 06/10/2019) 30 tablet 5   No facility-administered medications prior to visit.     EXAM:  BP 124/70 (BP Location: Right Arm, Patient Position: Sitting, Cuff Size: Normal)   Pulse 85   Temp 97.7 F (36.5 C) (Temporal)   Ht 5\' 8"  (1.727 m)   Wt (!) 310 lb 9.6 oz (140.9 kg)   SpO2 95%   BMI 47.23 kg/m   Body mass index is 47.23 kg/m.  GENERAL: vitals reviewed and listed above, alert, oriented, appears well hydrated and in no acute distress HEENT: atraumatic, conjunctiva  clear, no obvious abnormalities on inspection of external nose and ears eoms nl glasses  OP : masked  NECK: no obvious masses on inspection palpation  LUNGS: clear to auscultation bilaterally, no wheezes, rales or rhonchi, good air movement CV: HRRR, no clubbing cyanosis  No g or m  Legs with chronic edema changes and redness anterior but noulcers   And callous on feet  Pulses in tact  Hammer toe second l  MS: moves all extremities without noticeable focal  Abnormality Abdomen:  Sof,t normal bowel sounds without hepatosplenomegaly, no guarding rebound or obvious  masses no CVA tenderness Neuro non focal   Today and gait steady   PSYCH: pleasant and cooperative, no obvious depression or anxiety Lab Results    Component Value Date   WBC 7.0 11/04/2017   HGB 12.2 11/04/2017   HCT 37.4  11/04/2017   PLT 249.0 11/04/2017   GLUCOSE 91 11/04/2017   CHOL 186 11/04/2017   TRIG 87.0 11/04/2017   HDL 40.00 11/04/2017   LDLDIRECT 141.7 04/03/2011   LDLCALC 128 (H) 11/04/2017   ALT 14 11/04/2017   AST 11 11/04/2017   NA 139 11/04/2017   K 4.1 11/04/2017   CL 101 11/04/2017   CREATININE 0.63 11/04/2017   BUN 10 11/04/2017   CO2 33 (H) 11/04/2017   TSH 1.95 11/04/2017   INR 0.98 08/02/2013   HGBA1C 6.0 11/04/2017   BP Readings from Last 3 Encounters:  06/10/19 124/70  05/28/18 (!) 144/68  05/04/18 (!) 156/82   Wt Readings from Last 3 Encounters:  06/10/19 (!) 310 lb 9.6 oz (140.9 kg)  05/28/18 (!) 302 lb (137 kg)  05/17/18 (!) 305 lb 12.8 oz (138.7 kg)     ASSESSMENT AND PLAN:  Discussed the following assessment and plan:  Visit for preventive health examination  Medication management - Plan: Basic metabolic panel, CBC with Differential/Platelet, Hemoglobin A1c, Hepatic function panel, Lipid panel, TSH  Essential hypertension - Plan: Basic metabolic panel, CBC with Differential/Platelet, Hemoglobin A1c, Hepatic function panel, Lipid panel, TSH  OSA (obstructive sleep apnea) - Plan: Basic metabolic panel, CBC with Differential/Platelet, Hemoglobin A1c, Hepatic function panel, Lipid panel, TSH  MORBID OBESITY - Plan: Basic metabolic panel, CBC with Differential/Platelet, Hemoglobin A1c, Hepatic function panel, Lipid panel, TSH  Multinodular goiter (nontoxic) - Plan: Basic metabolic panel, CBC with Differential/Platelet, Hemoglobin A1c, Hepatic function panel, Lipid panel, TSH  Hx of wheezing Due for labs  Monitoring  Await labs    If  Problem with "wheezing " is problematic I would have her see her pulmonary again (  Already seen for osa   ) consider  pfts etc . But weigh tloss will  Help either way  -Patient advised to return or notify health care team  if  new concerns  arise.  Return in about 1 year (around 06/09/2020) for depending on results. and how doing.   Patient Instructions  Will notify you  of labs when available.   Work on healthy weight loss .   If all is good see you in a year .     Health Maintenance, Female Adopting a healthy lifestyle and getting preventive care are important in promoting health and wellness. Ask your health care provider about:  The right schedule for you to have regular tests and exams.  Things you can do on your own to prevent diseases and keep yourself healthy. What should I know about diet, weight, and exercise? Eat a healthy diet   Eat a diet that includes plenty of vegetables, fruits, low-fat dairy products, and lean protein.  Do not eat a lot of foods that are high in solid fats, added sugars, or sodium. Maintain a healthy weight Body mass index (BMI) is used to identify weight problems. It estimates body fat based on height and weight. Your health care provider can help determine your BMI and help you achieve or maintain a healthy weight. Get regular exercise Get regular exercise. This is one of the most important things you can do for your health. Most adults should:  Exercise for at least 150 minutes each week. The exercise should increase your heart rate and make you sweat (moderate-intensity exercise).  Do strengthening exercises at least twice a week. This is in addition to the moderate-intensity exercise.  Spend less time sitting. Even light physical activity can be beneficial. Watch  cholesterol and blood lipids Have your blood tested for lipids and cholesterol at 69 years of age, then have this test every 5 years. Have your cholesterol levels checked more often if:  Your lipid or cholesterol levels are high.  You are older than 69 years of age.  You are at high risk for heart disease. What should I know about cancer screening? Depending on your health history and family history, you may  need to have cancer screening at various ages. This may include screening for:  Breast cancer.  Cervical cancer.  Colorectal cancer.  Skin cancer.  Lung cancer. What should I know about heart disease, diabetes, and high blood pressure? Blood pressure and heart disease  High blood pressure causes heart disease and increases the risk of stroke. This is more likely to develop in people who have high blood pressure readings, are of African descent, or are overweight.  Have your blood pressure checked: ? Every 3-5 years if you are 29-65 years of age. ? Every year if you are 68 years old or older. Diabetes Have regular diabetes screenings. This checks your fasting blood sugar level. Have the screening done:  Once every three years after age 79 if you are at a normal weight and have a low risk for diabetes.  More often and at a younger age if you are overweight or have a high risk for diabetes. What should I know about preventing infection? Hepatitis B If you have a higher risk for hepatitis B, you should be screened for this virus. Talk with your health care provider to find out if you are at risk for hepatitis B infection. Hepatitis C Testing is recommended for:  Everyone born from [birth year] through 1965.  Anyone with known risk factors for hepatitis C. Sexually transmitted infections (STIs)  Get screened for STIs, including gonorrhea and chlamydia, if: ? You are sexually active and are younger than 69 years of age. ? You are older than 69 years of age and your health care provider tells you that you are at risk for this type of infection. ? Your sexual activity has changed since you were last screened, and you are at increased risk for chlamydia or gonorrhea. Ask your health care provider if you are at risk.  Ask your health care provider about whether you are at high risk for HIV. Your health care provider may recommend a prescription medicine to help prevent HIV infection. If you  choose to take medicine to prevent HIV, you should first get tested for HIV. You should then be tested every 3 months for as long as you are taking the medicine. Pregnancy  If you are about to stop having your period (premenopausal) and you may become pregnant, seek counseling before you get pregnant.  Take 400 to 800 micrograms (mcg) of folic acid every day if you become pregnant.  Ask for birth control (contraception) if you want to prevent pregnancy. Osteoporosis and menopause Osteoporosis is a disease in which the bones lose minerals and strength with aging. This can result in bone fractures. If you are 13 years old or older, or if you are at risk for osteoporosis and fractures, ask your health care provider if you should:  Be screened for bone loss.  Take a calcium or vitamin D supplement to lower your risk of fractures.  Be given hormone replacement therapy (HRT) to treat symptoms of menopause. Follow these instructions at home: Lifestyle  Do not use any products that contain nicotine or  tobacco, such as cigarettes, e-cigarettes, and chewing tobacco. If you need help quitting, ask your health care provider.  Do not use street drugs.  Do not share needles.  Ask your health care provider for help if you need support or information about quitting drugs. Alcohol use  Do not drink alcohol if: ? Your health care provider tells you not to drink. ? You are pregnant, may be pregnant, or are planning to become pregnant.  If you drink alcohol: ? Limit how much you use to 0-1 drink a day. ? Limit intake if you are breastfeeding.  Be aware of how much alcohol is in your drink. In the [place] one drink equals one 12 oz bottle of beer (355 mL), one 5 oz glass of wine (148 mL), or one 1 oz glass of hard liquor (44 mL). General instructions  Schedule regular health, dental, and eye exams.  Stay current with your vaccines.  Tell your health care provider if: ? You often feel  depressed. ? You have ever been abused or do not feel safe at home. Summary  Adopting a healthy lifestyle and getting preventive care are important in promoting health and wellness.  Follow your health care provider's instructions about healthy diet, exercising, and getting tested or screened for diseases.  Follow your health care provider's instructions on monitoring your cholesterol and blood pressure. This information is not intended to replace advice given to you by your health care provider. Make sure you discuss any questions you have with your health care provider. Document Revised: 03/10/2018 Document Reviewed: 03/10/2018 Elsevier Patient Education  2020 [place] Tyona Nilsen M.D.

## 2019-06-10 ENCOUNTER — Ambulatory Visit (INDEPENDENT_AMBULATORY_CARE_PROVIDER_SITE_OTHER): Payer: Medicare HMO | Admitting: Internal Medicine

## 2019-06-10 ENCOUNTER — Encounter: Payer: Self-pay | Admitting: Internal Medicine

## 2019-06-10 VITALS — BP 124/70 | HR 85 | Temp 97.7°F | Ht 68.0 in | Wt 310.6 lb

## 2019-06-10 DIAGNOSIS — G4733 Obstructive sleep apnea (adult) (pediatric): Secondary | ICD-10-CM | POA: Diagnosis not present

## 2019-06-10 DIAGNOSIS — Z0000 Encounter for general adult medical examination without abnormal findings: Secondary | ICD-10-CM

## 2019-06-10 DIAGNOSIS — E042 Nontoxic multinodular goiter: Secondary | ICD-10-CM

## 2019-06-10 DIAGNOSIS — I10 Essential (primary) hypertension: Secondary | ICD-10-CM

## 2019-06-10 DIAGNOSIS — Z79899 Other long term (current) drug therapy: Secondary | ICD-10-CM

## 2019-06-10 DIAGNOSIS — Z87898 Personal history of other specified conditions: Secondary | ICD-10-CM

## 2019-06-10 LAB — BASIC METABOLIC PANEL
BUN: 15 mg/dL (ref 6–23)
CO2: 34 mEq/L — ABNORMAL HIGH (ref 19–32)
Calcium: 9.2 mg/dL (ref 8.4–10.5)
Chloride: 99 mEq/L (ref 96–112)
Creatinine, Ser: 0.59 mg/dL (ref 0.40–1.20)
GFR: 101.3 mL/min (ref >60.00)
Glucose, Bld: 95 mg/dL (ref 70–99)
Potassium: 4.1 mEq/L (ref 3.5–5.1)
Sodium: 138 mEq/L (ref 135–145)

## 2019-06-10 LAB — CBC WITH DIFFERENTIAL/PLATELET
Basophils Absolute: 0 10*3/uL (ref 0.0–0.1)
Basophils Relative: 0.5 % (ref 0.0–3.0)
Eosinophils Absolute: 0.3 10*3/uL (ref 0.0–0.7)
Eosinophils Relative: 4.4 % (ref 0.0–5.0)
HCT: 40.3 % (ref 36.0–46.0)
Hemoglobin: 13.2 g/dL (ref 12.0–15.0)
Lymphocytes Relative: 19.4 % (ref 12.0–46.0)
Lymphs Abs: 1.5 10*3/uL (ref 0.7–4.0)
MCHC: 32.9 g/dL (ref 30.0–36.0)
MCV: 84.4 fl (ref 78.0–100.0)
Monocytes Absolute: 0.5 10*3/uL (ref 0.1–1.0)
Monocytes Relative: 6.1 % (ref 3.0–12.0)
Neutro Abs: 5.3 10*3/uL (ref 1.4–7.7)
Neutrophils Relative %: 69.6 % (ref 43.0–77.0)
Platelets: 257 10*3/uL (ref 150.0–400.0)
RBC: 4.77 Mil/uL (ref 3.87–5.11)
RDW: 14.8 % (ref 11.5–15.5)
WBC: 7.6 10*3/uL (ref 4.0–10.5)

## 2019-06-10 LAB — HEPATIC FUNCTION PANEL
ALT: 15 U/L (ref 0–35)
AST: 12 U/L (ref 0–37)
Albumin: 3.6 g/dL (ref 3.5–5.2)
Alkaline Phosphatase: 101 U/L (ref 39–117)
Bilirubin, Direct: 0.1 mg/dL (ref 0.0–0.3)
Total Bilirubin: 0.5 mg/dL (ref 0.2–1.2)
Total Protein: 6.8 g/dL (ref 6.0–8.3)

## 2019-06-10 LAB — LIPID PANEL
Cholesterol: 195 mg/dL (ref 0–200)
HDL: 42.9 mg/dL (ref >39.00)
LDL Cholesterol: 134 mg/dL — ABNORMAL HIGH (ref 0–99)
NonHDL: 152.01
Total CHOL/HDL Ratio: 5
Triglycerides: 90 mg/dL (ref 0.0–149.0)
VLDL: 18 mg/dL (ref 0.0–40.0)

## 2019-06-10 LAB — HEMOGLOBIN A1C: Hgb A1c MFr Bld: 6 % (ref 4.6–6.5)

## 2019-06-10 LAB — TSH: TSH: 2.21 u[IU]/mL (ref 0.35–4.50)

## 2019-06-10 NOTE — Patient Instructions (Signed)
Will notify you  of labs when available.   Work on healthy weight loss .   If all is good see you in a year .     Health Maintenance, Female Adopting a healthy lifestyle and getting preventive care are important in promoting health and wellness. Ask your health care provider about:  The right schedule for you to have regular tests and exams.  Things you can do on your own to prevent diseases and keep yourself healthy. What should I know about diet, weight, and exercise? Eat a healthy diet   Eat a diet that includes plenty of vegetables, fruits, low-fat dairy products, and lean protein.  Do not eat a lot of foods that are high in solid fats, added sugars, or sodium. Maintain a healthy weight Body mass index (BMI) is used to identify weight problems. It estimates body fat based on height and weight. Your health care provider can help determine your BMI and help you achieve or maintain a healthy weight. Get regular exercise Get regular exercise. This is one of the most important things you can do for your health. Most adults should:  Exercise for at least 150 minutes each week. The exercise should increase your heart rate and make you sweat (moderate-intensity exercise).  Do strengthening exercises at least twice a week. This is in addition to the moderate-intensity exercise.  Spend less time sitting. Even light physical activity can be beneficial. Watch cholesterol and blood lipids Have your blood tested for lipids and cholesterol at 69 years of age, then have this test every 5 years. Have your cholesterol levels checked more often if:  Your lipid or cholesterol levels are high.  You are older than 69 years of age.  You are at high risk for heart disease. What should I know about cancer screening? Depending on your health history and family history, you may need to have cancer screening at various ages. This may include screening for:  Breast cancer.  Cervical  cancer.  Colorectal cancer.  Skin cancer.  Lung cancer. What should I know about heart disease, diabetes, and high blood pressure? Blood pressure and heart disease  High blood pressure causes heart disease and increases the risk of stroke. This is more likely to develop in people who have high blood pressure readings, are of African descent, or are overweight.  Have your blood pressure checked: ? Every 3-5 years if you are 10-44 years of age. ? Every year if you are 45 years old or older. Diabetes Have regular diabetes screenings. This checks your fasting blood sugar level. Have the screening done:  Once every three years after age 18 if you are at a normal weight and have a low risk for diabetes.  More often and at a younger age if you are overweight or have a high risk for diabetes. What should I know about preventing infection? Hepatitis B If you have a higher risk for hepatitis B, you should be screened for this virus. Talk with your health care provider to find out if you are at risk for hepatitis B infection. Hepatitis C Testing is recommended for:  Everyone born from [birth year] through 1965.  Anyone with known risk factors for hepatitis C. Sexually transmitted infections (STIs)  Get screened for STIs, including gonorrhea and chlamydia, if: ? You are sexually active and are younger than 69 years of age. ? You are older than 69 years of age and your health care provider tells you that you are at risk  for this type of infection. ? Your sexual activity has changed since you were last screened, and you are at increased risk for chlamydia or gonorrhea. Ask your health care provider if you are at risk.  Ask your health care provider about whether you are at high risk for HIV. Your health care provider may recommend a prescription medicine to help prevent HIV infection. If you choose to take medicine to prevent HIV, you should first get tested for HIV. You should then be tested every 3  months for as long as you are taking the medicine. Pregnancy  If you are about to stop having your period (premenopausal) and you may become pregnant, seek counseling before you get pregnant.  Take 400 to 800 micrograms (mcg) of folic acid every day if you become pregnant.  Ask for birth control (contraception) if you want to prevent pregnancy. Osteoporosis and menopause Osteoporosis is a disease in which the bones lose minerals and strength with aging. This can result in bone fractures. If you are 27 years old or older, or if you are at risk for osteoporosis and fractures, ask your health care provider if you should:  Be screened for bone loss.  Take a calcium or vitamin D supplement to lower your risk of fractures.  Be given hormone replacement therapy (HRT) to treat symptoms of menopause. Follow these instructions at home: Lifestyle  Do not use any products that contain nicotine or tobacco, such as cigarettes, e-cigarettes, and chewing tobacco. If you need help quitting, ask your health care provider.  Do not use street drugs.  Do not share needles.  Ask your health care provider for help if you need support or information about quitting drugs. Alcohol use  Do not drink alcohol if: ? Your health care provider tells you not to drink. ? You are pregnant, may be pregnant, or are planning to become pregnant.  If you drink alcohol: ? Limit how much you use to 0-1 drink a day. ? Limit intake if you are breastfeeding.  Be aware of how much alcohol is in your drink. In the [place] one drink equals one 12 oz bottle of beer (355 mL), one 5 oz glass of wine (148 mL), or one 1 oz glass of hard liquor (44 mL). General instructions  Schedule regular health, dental, and eye exams.  Stay current with your vaccines.  Tell your health care provider if: ? You often feel depressed. ? You have ever been abused or do not feel safe at home. Summary  Adopting a healthy lifestyle and getting  preventive care are important in promoting health and wellness.  Follow your health care provider's instructions about healthy diet, exercising, and getting tested or screened for diseases.  Follow your health care provider's instructions on monitoring your cholesterol and blood pressure. This information is not intended to replace advice given to you by your health care provider. Make sure you discuss any questions you have with your health care provider. Document Revised: 03/10/2018 Document Reviewed: 03/10/2018 Elsevier Patient Education  2020 [employer].

## 2019-06-13 IMAGING — MG DIGITAL SCREENING BILATERAL MAMMOGRAM WITH TOMO AND CAD
8 series · 8 of 24 positions shown · non-contrast
Comparison: Previous exam(s).

CLINICAL DATA: Screening.

EXAM:
DIGITAL SCREENING BILATERAL MAMMOGRAM WITH TOMO AND CAD

[L MLO synth-2D]
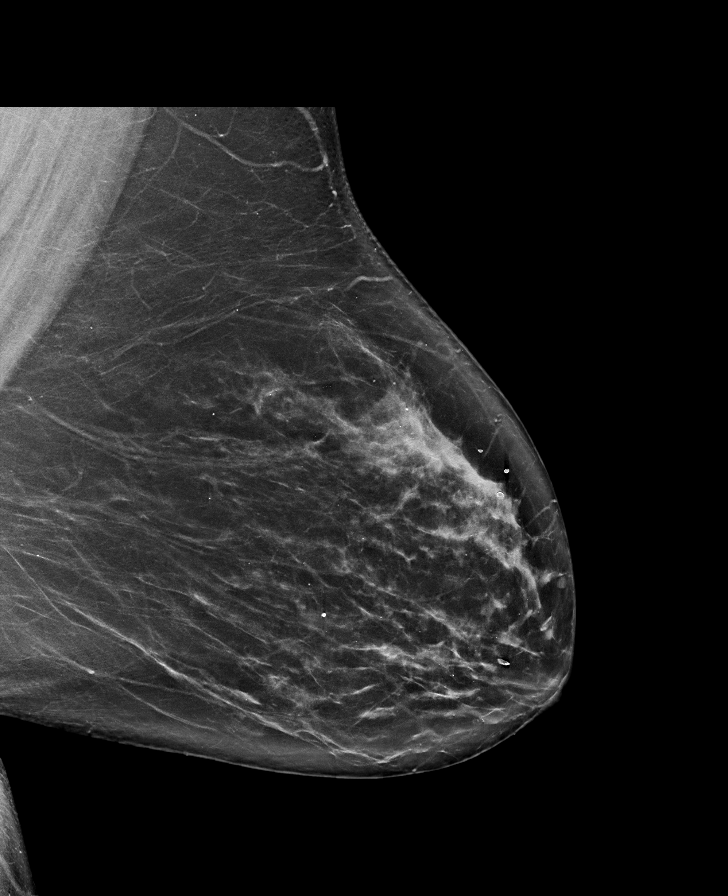

[R CC synth-2D]
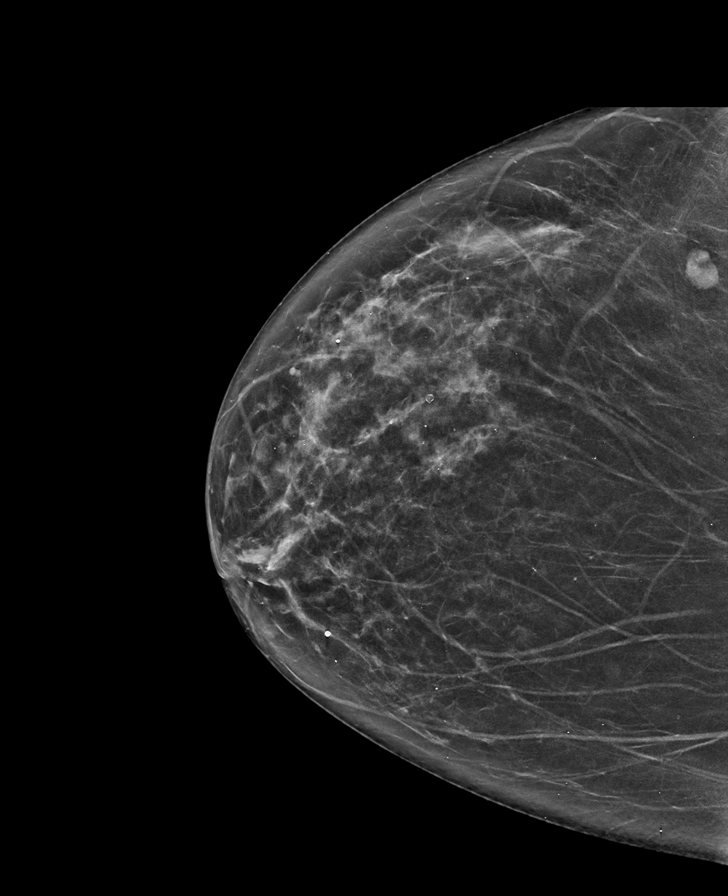

[R MLO synth-2D]
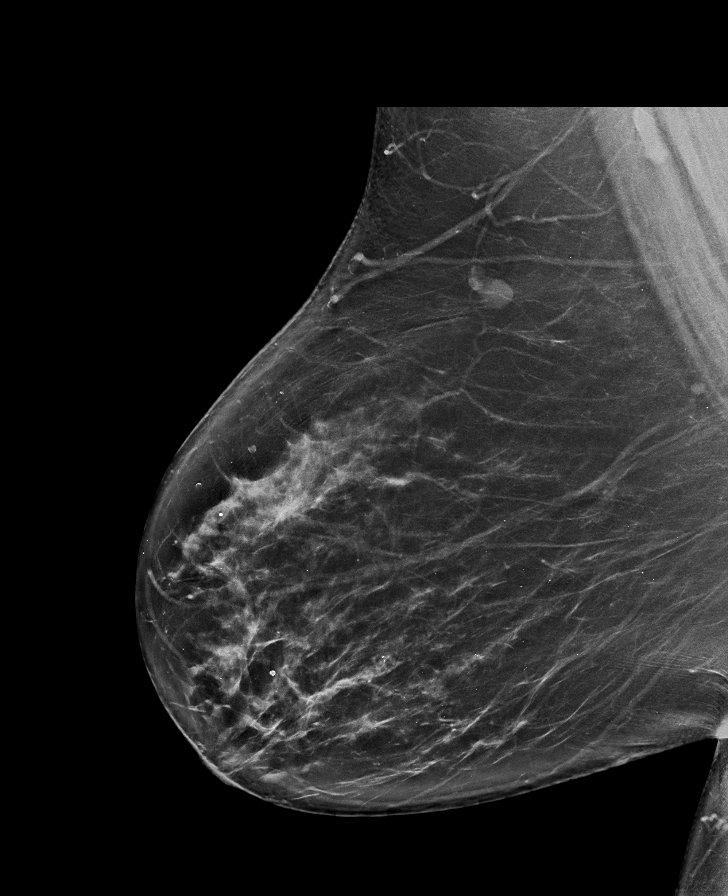

[L CC synth-2D]
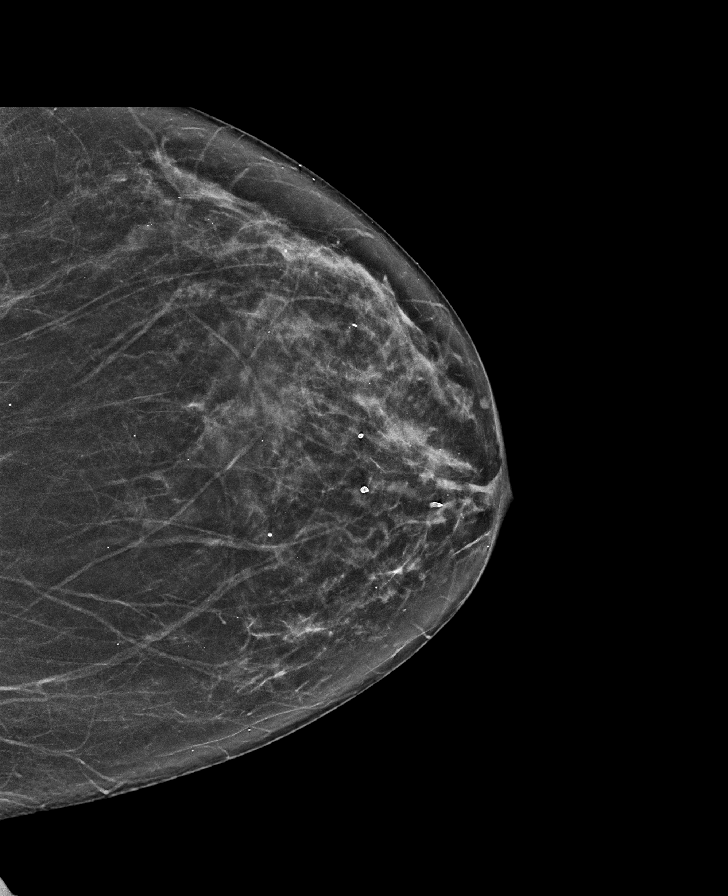

[R CC tomo · tomo slice 35/69.0]
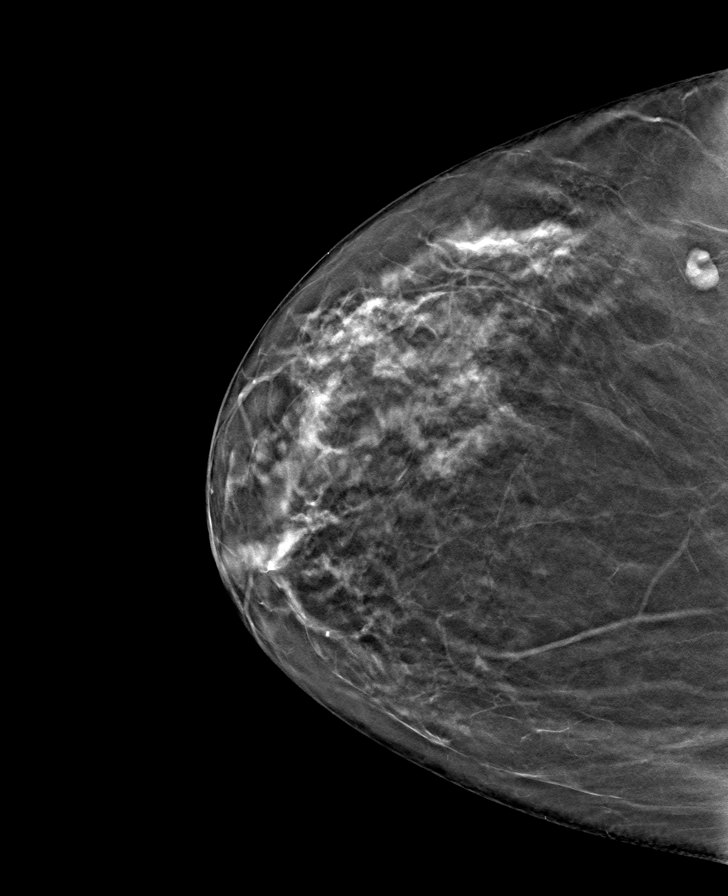

[L MLO tomo · tomo slice 43/85.0]
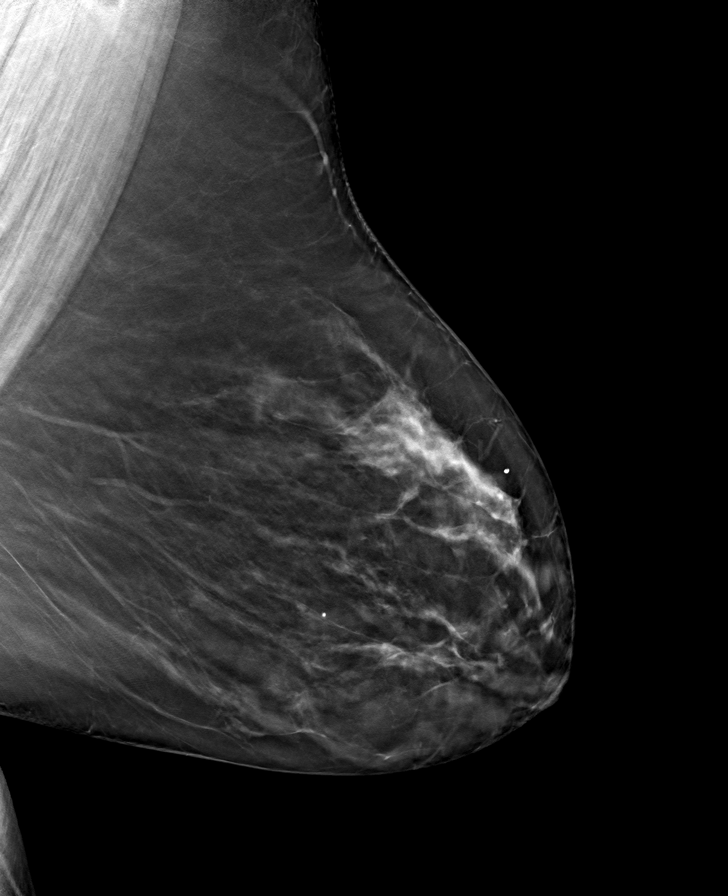

[L CC tomo · tomo slice 33/65.0]
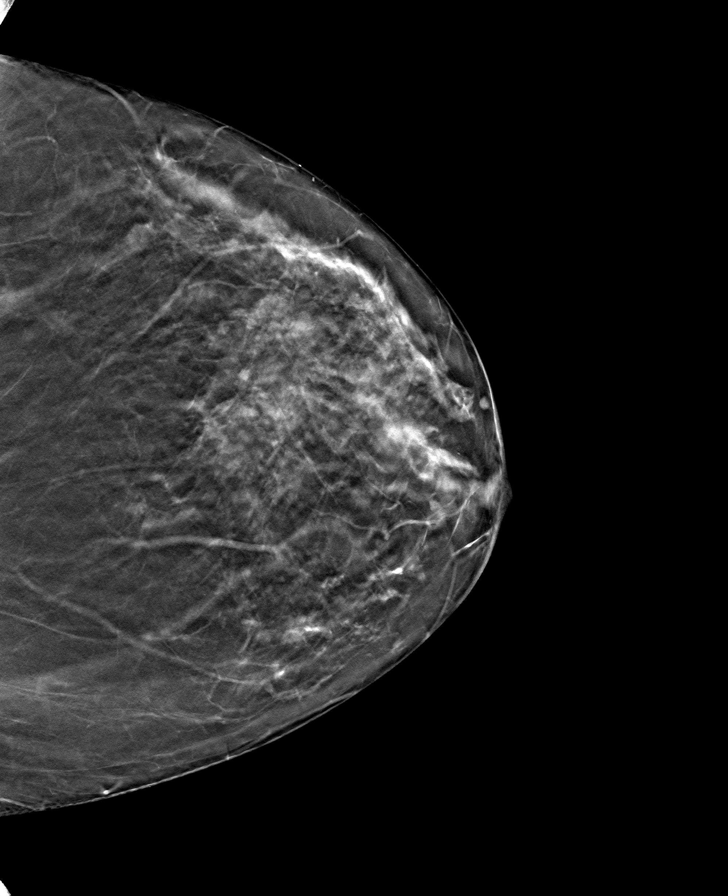

[R MLO tomo · tomo slice 45/90.0]
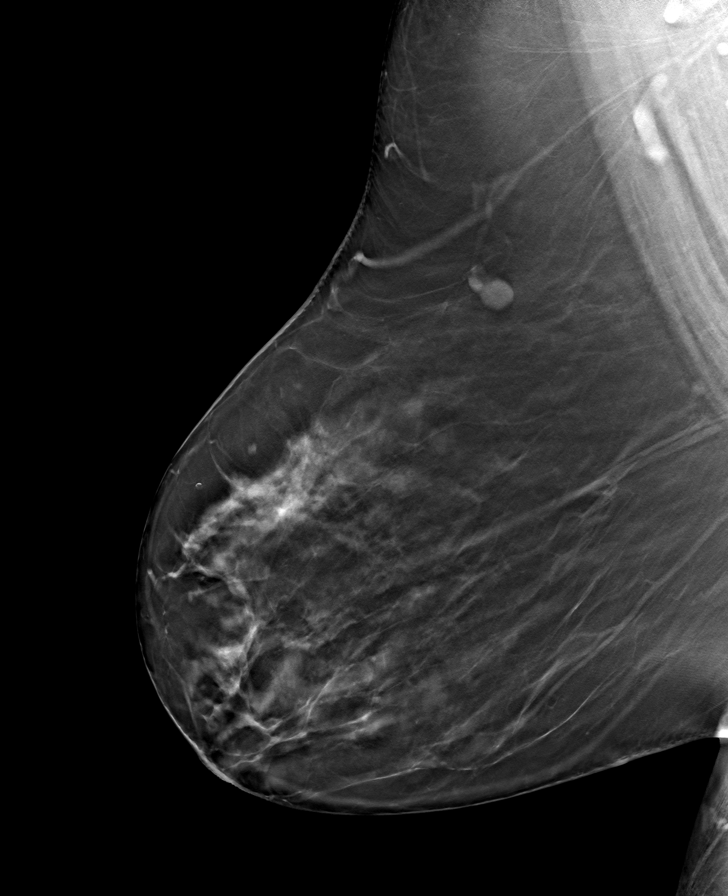

[8 of 24 positions shown; findings below may reference images not displayed]

ACR Breast Density Category b: There are scattered areas of
fibroglandular density.
FINDINGS: There are no findings suspicious for malignancy. Images were
processed with CAD.
IMPRESSION: No mammographic evidence of malignancy. A result letter of this
screening mammogram will be mailed directly to the patient.

RECOMMENDATION:
Screening mammogram in one year. (Code:[id number])

BI-RADS CATEGORY  1: Negative.

## 2019-06-13 NOTE — Progress Notes (Signed)
Blood results are  stable.  A1c   is 6  attention to lifestyle interventions.  Healthy weight loss to prevent getting diabetes  The 10-year ASCVD risk score Mikey Bussing DC Brooke Bonito., et al., 2013) is: 10.2%   Values used to calculate the score:     Age: 69 years     Sex: Female     Is Non-Hispanic African American: No     Diabetic: No     Tobacco smoker: No     Systolic Blood Pressure: [id number] mmHg     Is BP treated: Yes     HDL Cholesterol: 42.9 mg/dL     Total Cholesterol: 195 mg/dL

## 2019-07-04 ENCOUNTER — Other Ambulatory Visit: Payer: Self-pay | Admitting: Internal Medicine

## 2019-07-08 ENCOUNTER — Ambulatory Visit (HOSPITAL_COMMUNITY)
Admission: EM | Admit: 2019-07-08 | Discharge: 2019-07-08 | Disposition: A | Discharge status: Home / Self Care | Payer: Medicare HMO

## 2019-07-08 ENCOUNTER — Other Ambulatory Visit: Payer: Self-pay

## 2019-07-08 ENCOUNTER — Encounter (HOSPITAL_COMMUNITY): Payer: Self-pay

## 2019-07-08 DIAGNOSIS — S90821A Blister (nonthermal), right foot, initial encounter: Secondary | ICD-10-CM

## 2019-07-08 DIAGNOSIS — T148XXA Other injury of unspecified body region, initial encounter: Secondary | ICD-10-CM

## 2019-07-08 NOTE — ED Triage Notes (Signed)
Pt c/o blister on right 1st digit of footx2 days. Pt states she was walking on treadmill Wednesday and noticed the discomfort on toe.

## 2019-07-08 NOTE — Discharge Instructions (Signed)
Believe this is a blister.  You can keep the area clean use antibiotic ointment. Follow up as needed for continued or worsening symptoms

## 2019-07-09 NOTE — ED Provider Notes (Signed)
[facility]    CSN: [id number] Arrival date & time: 07/08/19  1339      History   Chief Complaint Chief Complaint  Patient presents with  . blister on toe    HPI Amy Stokes is a 69 y.o. female.   Pt is a 69 year old female that presents with blister to the right foot. This is in between the 1st and second toe. Mildly painful with walking. No drainage. Recently started wearing new shoes. Noticed when walking in treadmill.  No fever, chills.  Patient is not a diabetic.  No specific injury to the foot.  ROS per HPI Seriously     Past Medical History:  Diagnosis Date  . Arthritis    no meds  . Asthma   . Edema   . Fibroids   . Hyperlipidemia   . Hypertension   . Obesity   . Sleep apnea    wears cpap   . Thyroid nodule 01/2009   left discovered on chest ct; needle bx "indeterminant" cannon r/o follicular neoplasm     Patient Active Problem List   Diagnosis Date Noted  . Cough 08/09/2015  . Iliotibial band syndrome affecting left lower leg 05/26/2014  . Arthritis of left lower extremity 05/26/2014  . Low HDL (under 40) 04/08/2014  . Thyroid nodule 02/12/2014  . OSA (obstructive sleep apnea) 12/01/2013  . Seroma complicating a procedure 08/18/2013  . Bleeding gums 06/09/2011  . ALLERGY 01/31/2010  . MORBID OBESITY 07/16/2009  . Multinodular goiter (nontoxic) 01/13/2008  . RECTAL BLEEDING 11/22/2007  . UNS ADVRS EFF UNS RX MEDICINAL&BIOLOGICAL SBSTNC 07/26/2007  . HYPERLIPIDEMIA 04/06/2007  . HYPERTENSION 03/05/2007  . TACHYCARDIA 03/05/2007  . Asthma 12/28/2006    Past Surgical History:  Procedure Laterality Date  . ABDOMINAL HYSTERECTOMY Bilateral 08/02/2013   Procedure: TOTAL ABDOMINAL HYSTERECTOMY WITH BILATERAL SALPINGO-OOPHORECTOMY ;  Surgeon: Terrance Mass, MD;  Location: [facility];  Service: Gynecology;  Laterality: Bilateral;    Dr. Phineas Real to assist.  Dr. Zella Richer to follow with an umbilical hernia repair. He will need an  additional 1 1/2 hours.   . COLONOSCOPY  12/27/2007   Sharlett Iles   . HERNIA REPAIR    . MOUTH SURGERY    . thyroid needle bx    . TONSILLECTOMY    . UMBILICAL HERNIA REPAIR N/A 08/02/2013   Procedure: HERNIA REPAIR UMBILICAL ADULT WITH MESH;  Surgeon: Odis Hollingshead, MD;  Location: [facility];  Service: General;  Laterality: N/A;    OB History    Gravida  1   Para  0   Term      Preterm      AB  1   Living  0     SAB  1   TAB      Ectopic      Multiple      Live Births               Home Medications    Prior to Admission medications   Medication Sig Start Date End Date Taking? Authorizing Provider  ADVAIR DISKUS 100-50 MCG/DOSE AEPB TAKE 1 PUFF BY MOUTH TWICE A DAY *RINSE MOUTH AFTER USE* 01/24/19   Panosh, [name], MD  albuterol (PROAIR HFA) 108 (90 BASE) MCG/ACT inhaler Inhale 2 puffs into the lungs every 6 (six) hours as needed for wheezing. 10/12/12   Panosh, [name], MD  aspirin EC 81 MG tablet Take 81 mg by mouth daily.  [provider]  Cholecalciferol 4000 units CAPS Take by mouth. PATIENT USING OIL AND NOT CAPSULES    [provider]  levothyroxine (SYNTHROID, LEVOTHROID) 88 MCG tablet Take 88 mcg by mouth daily. 09/20/17   [provider]  loratadine (CLARITIN) 10 MG tablet Take 10 mg by mouth daily.    [provider]  losartan (COZAAR) 100 MG tablet Take 1 tablet (100 mg total) by mouth daily. 07/04/19   Panosh, [name], MD    Family History Family History  Problem Relation Age of Onset  . Arthritis Mother   . Other Mother        neurological disorder  . Pancreatic cancer Mother   . Heart disease Father   . Thyroid nodules Sister   . Heart disease Brother        x2 1 brother deceased  . Colon cancer Neg Hx   . Colon polyps Neg Hx   . Esophageal cancer Neg Hx   . Rectal cancer Neg Hx   . Stomach cancer Neg Hx     Social History Social History   Tobacco Use  . Smoking status: Former Smoker     Packs/day: 1.00    Years: 1.00    Pack years: 1.00    Types: Cigarettes    Quit date: 04/29/1971    Years since quitting: 48.2  . Smokeless tobacco: Never Used  Substance Use Topics  . Alcohol use: Yes    Alcohol/week: 0.0 standard drinks    Comment: occasionally  . Drug use: No     Allergies   Patient has no known allergies.   Review of Systems Review of Systems   Physical Exam Triage Vital Signs ED Triage Vitals  Enc Vitals Group     BP 07/08/19 1434 (!) 155/78     Pulse Rate 07/08/19 1434 78     Resp 07/08/19 1434 16     Temp 07/08/19 1434 98.6 F (37 C)     Temp Source 07/08/19 1434 Oral     SpO2 07/08/19 1434 95 %     Weight 07/08/19 1445 (!) 311 lb (141.1 kg)     Height 07/08/19 1445 5\' 8"  (1.727 m)     Head Circumference --      Peak Flow --      Pain Score 07/08/19 1444 0     Pain Loc --      Pain Edu? --      Excl. in [place]? --    No data found.  Updated Vital Signs BP (!) 155/78 (BP Location: Right Arm)   Pulse 78   Temp 98.6 F (37 C) (Oral)   Resp 16   Ht 5\' 8"  (1.727 m)   Wt (!) 311 lb (141.1 kg)   SpO2 95%   BMI 47.29 kg/m   Visual Acuity Right Eye Distance:   Left Eye Distance:   Bilateral Distance:    Right Eye Near:   Left Eye Near:    Bilateral Near:     Physical Exam Vitals and nursing note reviewed.  Constitutional:      General: She is not in acute distress.    Appearance: Normal appearance. She is not ill-appearing, toxic-appearing or diaphoretic.  HENT:     Head: Normocephalic.     Nose: Nose normal.  Eyes:     Conjunctiva/sclera: Conjunctivae normal.  Pulmonary:     Effort: Pulmonary effort is normal.  Musculoskeletal:        General: Normal  range of motion.     Cervical back: Normal range of motion.  Skin:    General: Skin is warm and dry.     Findings: No rash.     Comments: Small blister between the great toe and second toe to right foot, plantar side.   Neurological:     Mental Status: She is alert.    Psychiatric:        Mood and Affect: Mood normal.      UC Treatments / Results  Labs (all labs ordered are listed, but only abnormal results are displayed) Labs Reviewed - No data to display  EKG   Radiology No results found.  Procedures Procedures (including critical care time)  Medications Ordered in UC Medications - No data to display  Initial Impression / Assessment and Plan / UC Course  I have reviewed the triage vital signs and the nursing notes.  Pertinent labs & imaging results that were available during my care of the patient were reviewed by me and considered in my medical decision making (see chart for details).     Blister- drained here in clinic, serous fluid.  No signs of infection. Recommended keep clean and abx ointment and cover.  Follow up as needed for continued or worsening symptoms  Final Clinical Impressions(s) / UC Diagnoses   Final diagnoses:  Blister     Discharge Instructions     Believe this is a blister.  You can keep the area clean use antibiotic ointment. Follow up as needed for continued or worsening symptoms     ED Prescriptions    None     PDMP not reviewed this encounter.   Orvan July, NP 07/09/19 [phone]

## 2019-07-12 DIAGNOSIS — Z803 Family history of malignant neoplasm of breast: Secondary | ICD-10-CM | POA: Diagnosis not present

## 2019-07-12 DIAGNOSIS — J45909 Unspecified asthma, uncomplicated: Secondary | ICD-10-CM | POA: Diagnosis not present

## 2019-07-12 DIAGNOSIS — I10 Essential (primary) hypertension: Secondary | ICD-10-CM | POA: Diagnosis not present

## 2019-07-12 DIAGNOSIS — R69 Illness, unspecified: Secondary | ICD-10-CM | POA: Diagnosis not present

## 2019-07-12 DIAGNOSIS — R32 Unspecified urinary incontinence: Secondary | ICD-10-CM | POA: Diagnosis not present

## 2019-07-12 DIAGNOSIS — Z7951 Long term (current) use of inhaled steroids: Secondary | ICD-10-CM | POA: Diagnosis not present

## 2019-07-12 DIAGNOSIS — Z7982 Long term (current) use of aspirin: Secondary | ICD-10-CM | POA: Diagnosis not present

## 2019-07-12 DIAGNOSIS — E039 Hypothyroidism, unspecified: Secondary | ICD-10-CM | POA: Diagnosis not present

## 2019-07-12 DIAGNOSIS — Z8249 Family history of ischemic heart disease and other diseases of the circulatory system: Secondary | ICD-10-CM | POA: Diagnosis not present

## 2019-07-12 DIAGNOSIS — Z7722 Contact with and (suspected) exposure to environmental tobacco smoke (acute) (chronic): Secondary | ICD-10-CM | POA: Diagnosis not present

## 2019-07-18 ENCOUNTER — Ambulatory Visit
Admission: RE | Admit: 2019-07-18 | Discharge: 2019-07-18 | Disposition: A | Discharge status: Home / Self Care | Payer: Medicare HMO | Source: Ambulatory Visit | Attending: Endocrinology | Admitting: Endocrinology

## 2019-07-18 DIAGNOSIS — E049 Nontoxic goiter, unspecified: Secondary | ICD-10-CM

## 2019-07-18 DIAGNOSIS — E041 Nontoxic single thyroid nodule: Secondary | ICD-10-CM | POA: Diagnosis not present

## 2019-07-19 ENCOUNTER — Other Ambulatory Visit: Payer: Self-pay | Admitting: Internal Medicine

## 2019-10-28 DIAGNOSIS — E02 Subclinical iodine-deficiency hypothyroidism: Secondary | ICD-10-CM | POA: Diagnosis not present

## 2019-11-04 DIAGNOSIS — I10 Essential (primary) hypertension: Secondary | ICD-10-CM | POA: Diagnosis not present

## 2019-11-04 DIAGNOSIS — E02 Subclinical iodine-deficiency hypothyroidism: Secondary | ICD-10-CM | POA: Diagnosis not present

## 2019-11-04 DIAGNOSIS — E049 Nontoxic goiter, unspecified: Secondary | ICD-10-CM | POA: Diagnosis not present

## 2019-11-22 ENCOUNTER — Other Ambulatory Visit: Payer: Self-pay | Admitting: Obstetrics and Gynecology

## 2019-11-22 DIAGNOSIS — Z1231 Encounter for screening mammogram for malignant neoplasm of breast: Secondary | ICD-10-CM

## 2019-11-30 ENCOUNTER — Telehealth: Payer: Self-pay | Admitting: Internal Medicine

## 2019-11-30 NOTE — Progress Notes (Signed)
  Chronic Care Management   Note  11/30/2019 Name: SOPHEE MCKIMMY MRN: [id number] DOB: March 05, 1951  DELAYNE SANZO is a 69 y.o. year old female who is a primary care patient of Panosh, [name], MD. I reached out to [name] by phone today in response to a referral sent by Ms. Jill Side Higashi's PCP, Panosh, [name], MD.   Ms. Bertelson was given information about Chronic Care Management services today including:  1. CCM service includes personalized support from designated clinical staff supervised by her physician, including individualized plan of care and coordination with other care providers 2. 24/7 contact phone numbers for assistance for urgent and routine care needs. 3. Service will only be billed when office clinical staff spend 20 minutes or more in a month to coordinate care. 4. Only one practitioner may furnish and bill the service in a calendar month. 5. The patient may stop CCM services at any time (effective at the end of the month) by phone call to the office staff.   Patient agreed to services and verbal consent obtained.   Follow up plan:   Carley Perdue [employer] Scheduler

## 2019-12-06 ENCOUNTER — Other Ambulatory Visit: Payer: Self-pay

## 2019-12-06 NOTE — Progress Notes (Signed)
Chief Complaint  Patient presents with  . Abdominal Pain    burning in stomach, center of stomach, going of for a couple of months    HPI: Amy Stokes 69 y.o. come in for  Stomach pain  Similar to in past but  Recurred recently  Nagging little burning over months vs weeks comes and goes  and would take antacids but never total y going away    thie week end  got worse with keeping her awake.   Today better.  Took otc tums   ocass helps.  Describes and burning sensation around mid abdomen Stopped. asa in case.   about 3 weeks.  Eating no different  No vomiting diarrhea  No fever.   Minimal etoh  coffee in am . No supplements just multipvits.   ocass tylenol jaw pain.   Had booster in [place] trial  Early August ( did have sore arm and fever  Swollen glands)   ROS: See pertinent positives and negatives per HPI. No blood in stool  Had similar  Problem alast year and resolded with h2 blocker  Past Medical History:  Diagnosis Date  . Arthritis    no meds  . Asthma   . Edema   . Fibroids   . Hyperlipidemia   . Hypertension   . Obesity   . Sleep apnea    wears cpap   . Thyroid nodule 01/2009   left discovered on chest ct; needle bx "indeterminant" cannon r/o follicular neoplasm     Family History  Problem Relation Age of Onset  . Arthritis Mother   . Other Mother        neurological disorder  . Pancreatic cancer Mother   . Heart disease Father   . Thyroid nodules Sister   . Heart disease Brother        x2 1 brother deceased  . Colon cancer Neg Hx   . Colon polyps Neg Hx   . Esophageal cancer Neg Hx   . Rectal cancer Neg Hx   . Stomach cancer Neg Hx     Social History   Socioeconomic History  . Marital status: Married    Spouse name: Not on file  . Number of children: 0  . Years of education: Not on file  . Highest education level: Not on file  Occupational History  . Occupation: retired  Tobacco Use  . Smoking status: Former Smoker    Packs/day:  1.00    Years: 1.00    Pack years: 1.00    Types: Cigarettes    Quit date: 04/29/1971    Years since quitting: 48.6  . Smokeless tobacco: Never Used  Vaping Use  . Vaping Use: Never used  Substance and Sexual Activity  . Alcohol use: Yes    Alcohol/week: 0.0 standard drinks    Comment: occasionally  . Drug use: No  . Sexual activity: Not on file  Other Topics Concern  . Not on file  Social History Narrative   Married   Occupation: Chiropractor currently unemployed   Married   Regular exercise-no   [place] of 2   Pet cat      Social Determinants of Health   Financial Resource Strain:   . Difficulty of Paying Living Expenses: Not on file  Food Insecurity:   . Worried About Charity fundraiser in the Last Year: Not on file  . Ran Out of Food in the Last Year: Not on file  Transportation  Needs:   . Lack of Transportation (Medical): Not on file  . Lack of Transportation (Non-Medical): Not on file  Physical Activity:   . Days of Exercise per Week: Not on file  . Minutes of Exercise per Session: Not on file  Stress:   . Feeling of Stress : Not on file  Social Connections:   . Frequency of Communication with Friends and Family: Not on file  . Frequency of Social Gatherings with Friends and Family: Not on file  . Attends Religious Services: Not on file  . Active Member of Clubs or Organizations: Not on file  . Attends Archivist Meetings: Not on file  . Marital Status: Not on file    Outpatient Medications Prior to Visit  Medication Sig Dispense Refill  . ADVAIR DISKUS 100-50 MCG/DOSE AEPB TAKE 1 PUFF BY MOUTH TWICE A DAY *RINSE MOUTH AFTER USE* 180 each 1  . albuterol (PROAIR HFA) 108 (90 BASE) MCG/ACT inhaler Inhale 2 puffs into the lungs every 6 (six) hours as needed for wheezing. 1 Inhaler 2  . Cholecalciferol 4000 units CAPS Take by mouth. PATIENT USING OIL AND NOT CAPSULES    . clobetasol ointment (TEMOVATE) 0.05 % clobetasol 0.05 % topical ointment    .  levothyroxine (SYNTHROID) 100 MCG tablet Take 100 mcg by mouth every morning.    . loratadine (CLARITIN) 10 MG tablet Take 10 mg by mouth daily.    [name] losartan (COZAAR) 100 MG tablet Take 1 tablet (100 mg total) by mouth daily. 90 tablet 3  . MULTIPLE VITAMIN PO Take 1 tablet by mouth daily.    [name] aspirin EC 81 MG tablet Take 81 mg by mouth daily. (Patient not taking: Reported on 12/07/2019)    . levothyroxine (SYNTHROID, LEVOTHROID) 88 MCG tablet Take 88 mcg by mouth daily. (Patient not taking: Reported on 12/07/2019)  11   No facility-administered medications prior to visit.     EXAM:  BP (!) 146/80   Pulse 84   Temp 98.3 F (36.8 C) (Oral)   Ht 5\' 8"  (1.727 m)   Wt (!) 309 lb 9.6 oz (140.4 kg)   SpO2 97%   BMI 47.07 kg/m   Body mass index is 47.07 kg/m.  GENERAL: vitals reviewed and listed above, alert, oriented, appears well hydrated and in no acute distress HEENT: atraumatic, conjunctiva  clear, no obvious abnormalities on inspection of external nose and ears OP : masked  NECK: no obvious masses on inspection palpation  LUNGS: clear to auscultation bilaterally, no wheezes, rales or rhonchi, good air movement CV: HRRR, no clubbing cyanosis or  peripheral edema nl cap refill  Abdomen:  Sof,t normal bowel sounds without hepatosplenomegaly, no guarding rebound or masses no CVA tenderness MS: moves all extremities without noticeable focal  abnormality PSYCH: pleasant and cooperative, no obvious depression or anxiety Lab Results  Component Value Date   WBC 7.6 06/10/2019   HGB 13.2 06/10/2019   HCT 40.3 06/10/2019   PLT 257.0 06/10/2019   GLUCOSE 95 06/10/2019   CHOL 195 06/10/2019   TRIG 90.0 06/10/2019   HDL 42.90 06/10/2019   LDLDIRECT 141.7 04/03/2011   LDLCALC 134 (H) 06/10/2019   ALT 15 06/10/2019   AST 12 06/10/2019   NA 138 06/10/2019   K 4.1 06/10/2019   CL 99 06/10/2019   CREATININE 0.59 06/10/2019   BUN 15 06/10/2019   CO2 34 (H) 06/10/2019   TSH 2.21  06/10/2019   INR 0.98 08/02/2013   HGBA1C  6.0 06/10/2019   BP Readings from Last 3 Encounters:  12/07/19 (!) 146/80  07/08/19 (!) 155/78  06/10/19 124/70    ASSESSMENT AND PLAN:  Discussed the following assessment and plan:  Epigastric burning sensation  Need for immunization against influenza - Plan: Flu Vaccine QUAD High Dose(Fluad) Suspect mild gastritis   Hx of same?  No other alarm sx   Stay off asa until further notice  ppi trial short term see below and fu  I f.pop and cannot wean from med.  Patient reluctant to stay on ppi for potential se and I agree.  -Patient advised to return or notify health care team  if  new concerns arise.  Patient Instructions  Stay off of the asa for now. Take  PPI    Omeprazole  20 mg 1 a day for 2 weeks and then every other day .for 1-2 weeks then wean off.   Check in with [place] about how doing . After 3-4 weeks  And then go from  There.     [name]. Ryden Wainer M.D.

## 2019-12-07 ENCOUNTER — Encounter: Payer: Self-pay | Admitting: Internal Medicine

## 2019-12-07 ENCOUNTER — Ambulatory Visit (INDEPENDENT_AMBULATORY_CARE_PROVIDER_SITE_OTHER): Payer: Medicare HMO | Admitting: Internal Medicine

## 2019-12-07 VITALS — BP 146/80 | HR 84 | Temp 98.3°F | Ht 68.0 in | Wt 309.6 lb

## 2019-12-07 DIAGNOSIS — Z23 Encounter for immunization: Secondary | ICD-10-CM | POA: Diagnosis not present

## 2019-12-07 DIAGNOSIS — R1013 Epigastric pain: Secondary | ICD-10-CM | POA: Diagnosis not present

## 2019-12-07 NOTE — Patient Instructions (Addendum)
Stay off of the asa for now. Take  PPI    Omeprazole  20 mg 1 a day for 2 weeks and then every other day .for 1-2 weeks then wean off.   Check in with [place] about how doing . After 3-4 weeks  And then go from  There.

## 2019-12-30 ENCOUNTER — Other Ambulatory Visit: Payer: Self-pay

## 2019-12-30 DIAGNOSIS — E782 Mixed hyperlipidemia: Secondary | ICD-10-CM

## 2019-12-30 DIAGNOSIS — I10 Essential (primary) hypertension: Secondary | ICD-10-CM

## 2020-01-05 ENCOUNTER — Other Ambulatory Visit: Payer: Self-pay

## 2020-01-05 ENCOUNTER — Ambulatory Visit
Admission: RE | Admit: 2020-01-05 | Discharge: 2020-01-05 | Disposition: A | Discharge status: Home / Self Care | Payer: Medicare HMO | Source: Ambulatory Visit | Attending: Obstetrics and Gynecology | Admitting: Obstetrics and Gynecology

## 2020-01-05 DIAGNOSIS — Z1231 Encounter for screening mammogram for malignant neoplasm of breast: Secondary | ICD-10-CM | POA: Diagnosis not present

## 2020-01-06 ENCOUNTER — Telehealth: Payer: Self-pay | Admitting: Pharmacist

## 2020-01-09 ENCOUNTER — Other Ambulatory Visit: Payer: Self-pay

## 2020-01-09 ENCOUNTER — Ambulatory Visit: Payer: Medicare HMO | Admitting: Pharmacist

## 2020-01-09 DIAGNOSIS — I10 Essential (primary) hypertension: Secondary | ICD-10-CM

## 2020-01-09 DIAGNOSIS — E782 Mixed hyperlipidemia: Secondary | ICD-10-CM

## 2020-01-09 NOTE — Chronic Care Management (AMB) (Signed)
    Chronic Care Management Pharmacy Assistant   Name: Amy Stokes  MRN: [id number] DOB: September 22, 1950  Reason for Encounter:   Medication Review/Initial Questions for Pharmacist visit on 01/09/2020   Patient Questions: 1. Have you seen any other providers since your last visit? No 2. Any changes in your medications or health? No 3. Any side effects from any medications? No 4. Do you have any symptoms or problems not managed by your medications? No 5. Any concerns about your health right now?  No 6. Has your provider asked that you check blood pressure, blood sugar, or follow a special diet at home?  No 7. Do you get any type of exercise regularly?   No 8. Can you think of a goal you would like to reach for your health? She states that she would like to lose about 15lbs. 9. Do you have any problems getting your medications? No 10. Is there anything that you would like to discuss during the appointment? No  PCP : Burnis Medin, MD Allergies:  No Known Allergies  Medications: Outpatient Encounter Medications as of 01/06/2020  Medication Sig  . ADVAIR DISKUS 100-50 MCG/DOSE AEPB TAKE 1 PUFF BY MOUTH TWICE A DAY *RINSE MOUTH AFTER USE*  . albuterol (PROAIR HFA) 108 (90 BASE) MCG/ACT inhaler Inhale 2 puffs into the lungs every 6 (six) hours as needed for wheezing.  . Cholecalciferol 4000 units CAPS Take by mouth. PATIENT USING OIL AND NOT CAPSULES  . clobetasol ointment (TEMOVATE) 0.05 % clobetasol 0.05 % topical ointment  . levothyroxine (SYNTHROID) 100 MCG tablet Take 100 mcg by mouth every morning.  [name] levothyroxine (SYNTHROID, LEVOTHROID) 88 MCG tablet Take 88 mcg by mouth daily. (Patient not taking: Reported on 12/07/2019)  . loratadine (CLARITIN) 10 MG tablet Take 10 mg by mouth daily.  [name] losartan (COZAAR) 100 MG tablet Take 1 tablet (100 mg total) by mouth daily.  . MULTIPLE VITAMIN PO Take 1 tablet by mouth daily.   No facility-administered encounter medications on file as of  01/06/2020.    Current Diagnosis: Patient Active Problem List   Diagnosis Date Noted  . Cough 08/09/2015  . Iliotibial band syndrome affecting left lower leg 05/26/2014  . Arthritis of left lower extremity 05/26/2014  . Low HDL (under 40) 04/08/2014  . Thyroid nodule 02/12/2014  . OSA (obstructive sleep apnea) 12/01/2013  . Seroma complicating a procedure 08/18/2013  . Bleeding gums 06/09/2011  . ALLERGY 01/31/2010  . MORBID OBESITY 07/16/2009  . Multinodular goiter (nontoxic) 01/13/2008  . RECTAL BLEEDING 11/22/2007  . UNS ADVRS EFF UNS RX MEDICINAL&BIOLOGICAL SBSTNC 07/26/2007  . HYPERLIPIDEMIA 04/06/2007  . HYPERTENSION 03/05/2007  . TACHYCARDIA 03/05/2007  . Asthma 12/28/2006    Goals Addressed   None     Follow-Up:  Pharmacist Review   Maia Breslow, [place] Assistant [phone]

## 2020-01-09 NOTE — Chronic Care Management (AMB) (Signed)
Chronic Care Management Pharmacy  Name: Amy Stokes  MRN: [id number] DOB: 03-Apr-1950  Initial Planning Appointment: completed 01/06/20  Initial Questions: 1. Have you seen any other providers since your last visit? n/a 2. Any changes in your medicines or health? No   Chief Complaint/ HPI  Amy Stokes,  69 y.o. , female presents for their Initial CCM visit with the clinical pharmacist In office.  PCP : Burnis Medin, MD  Their chronic conditions include: Asthma, HTN, hypothyroidism, HLD, GERD, allergic rhinitis, jaw pain  Office Visits: -12/07/19 Shanon Ace, MD: Patient presented for evaluation of epigastric burning sensation. Pt stopped aspirin x 3 weeks just in case. Prescribed omeprazole short course for 4 weeks. Influenza shot given.  Consult Visit: -07/08/19 Patient presented to the ED with blister on toe.   Medications: Outpatient Encounter Medications as of 01/09/2020  Medication Sig  . acetaminophen (TYLENOL) 650 MG CR tablet Take 650 mg by mouth every 8 (eight) hours as needed for pain.  [name] ADVAIR DISKUS 100-50 MCG/DOSE AEPB TAKE 1 PUFF BY MOUTH TWICE A DAY *RINSE MOUTH AFTER USE*  . Cholecalciferol 50 MCG (2000 UT) CAPS Take 1 capsule by mouth daily. PATIENT USING OIL AND NOT CAPSULES   . clobetasol ointment (TEMOVATE) 1.91 % Apply 1 application topically as needed.   [name] levothyroxine (SYNTHROID) 100 MCG tablet Take 100 mcg by mouth every morning.  . loratadine (CLARITIN) 10 MG tablet Take 10 mg by mouth daily.  [name] losartan (COZAAR) 100 MG tablet Take 1 tablet (100 mg total) by mouth daily.  . MULTIPLE VITAMIN PO Take 1 tablet by mouth daily.  [name] omeprazole (PRILOSEC) 20 MG capsule Take 20 mg by mouth every other day.   . psyllium (METAMUCIL) 58.6 % powder Take 1 packet by mouth daily.  [name] albuterol (PROAIR HFA) 108 (90 BASE) MCG/ACT inhaler Inhale 2 puffs into the lungs every 6 (six) hours as needed for wheezing. (Patient not taking: Reported on 01/09/2020)  .  levothyroxine (SYNTHROID, LEVOTHROID) 88 MCG tablet Take 88 mcg by mouth daily. (Patient not taking: Reported on 12/07/2019)   No facility-administered encounter medications on file as of 01/09/2020.   Patient is lives with husband Gwyndolyn Saxon) and her sister lives as a Public librarian with their 3 kids. Patient sees youngest son most often as he still lives at home. Patient reports having a brother in [place] and sister in [place].   Patient is active with volunteering at the [facility] senior center. She also spends her spare time gardening and doing cross stitching. Patient does the cooking and husband helps with cleaning and they both do their own laundry. Patient admits to eating out a lot but not every night. Last night they had tacos with chicken, onion, green pepper, tomatoes and does endorse eating a green vegeatable with all meals,. Patient recently made zucchini lasagna with [name] and enjoyed it. She mostly eats chicken and [name], some salmon, and doesn't cook red meat often.  Patient is not exercising regularly currently. She was going to [employer] and canceled her membership over the summer due to [place]. Patient is able to drive herself to appointments without problems. She gets an average of 6-7 hours of sleep per night and feels rested without problems falling asleep.  Patient currently having some muscle spasms in hands, legs, stomach, leg cramps and is unsure if this is related to her medicines.  Current Diagnosis/Assessment:  Goals Addressed  This Visit's Progress   . Pharmacy Care Plan       CARE PLAN ENTRY (see longitudinal plan of care for additional care plan information)  Current Barriers:  . Chronic Disease Management support, education, and care coordination needs related to Hypertension, Hyperlipidemia, GERD, Asthma, Hypothyroidism, and jaw pain   Hypertension BP Readings from Last 3 Encounters:  12/07/19 (!) 146/80  07/08/19 (!) 155/78  06/10/19  124/70   . Pharmacist Clinical Goal(s): o Over the next 90 days, patient will work with PharmD and providers to achieve BP goal <140/90 . Current regimen:  o Losartan 100 mg 1 tablet daily  . Interventions: o We discussed limiting salt intake and reading nutrition labels for sodium content o We discussed exercising with a target of 150 minutes per week to help lower blood pressure o We discussed the importance of checking blood pressure at home regularly while taking BP medication . Patient self care activities - Over the next 90 days, patient will: o Check blood pressure weekly, document, and provide at future appointments o Ensure daily salt intake < 2300 mg/day  Hyperlipidemia Lab Results  Component Value Date/Time   LDLCALC 134 (H) 06/10/2019 10:16 AM   LDLDIRECT 141.7 04/03/2011 10:43 AM   . Pharmacist Clinical Goal(s): o Over the next 90 days, patient will work with PharmD and providers to achieve LDL goal < 100 . Current regimen:  o No medications . Interventions: o We discussed making dietary changes such as opting for baking instead of frying foods o We discussed incorporating more fiber in your diet through foods such as green leafy vegetables, fruit and oatmeal to lower cholesterol . Patient self care activities - Over the next 90 days, patient will: o Work on making lifestyle changes to lower cholesterol  Hypothyroidism . Pharmacist Clinical Goal(s): o Over the next 90 days, patient will work with PharmD and providers to maintain TSH between 0.4 to 4.5 . Current regimen:  o Levothyroxine 100 mcg 1 tablet daily . Interventions: o We discussed taking levothyroxine on an empty stomach in the morning separate from all other medications  . Patient self care activities - Over the next 90 days, patient will: o Continue current medications  Asthma . Pharmacist Clinical Goal(s) o Over the next 90 days, patient will work with PharmD and providers to manage symptoms of  asthma . Current regimen:  . Advair 100-50 mcg/dose 1 puff twice daily  . Albuterol HFA 1 puff every 6 hours as needed . Interventions: o We discussed making sure to rinse your mouth out and spit with water after every use of inhaler . Patient self care activities - Over the next 90 days, patient will: o Continue current medications o Discuss with pharmacy about switching to generic Advair to cut down on the cost  Jaw pain . Pharmacist Clinical Goal(s) o Over the next 90 days, patient will work with PharmD and providers to control pain . Current regimen:  o Acetaminophen 650 mg 2 tablet before bedtime . Interventions: o We discussed the maximum recommended dose of Tylenol is 3,000 mg/day (no more than 4 tablets of extra strength)  o We discussed that Tylenol can be found in many over the counter cough and cold products . Patient self care activities - Over the next 90 days, patient will: o Continue current management  GERD . Pharmacist Clinical Goal(s) o Over the next 90 days, patient will work with PharmD and providers to manage symptoms of heartburn or indigestion . Current  regimen:  o Omeprazole 20 mg every other day . Interventions: o We discussed non medication related things to do to minimize symptoms of heartburn such as elevating head of bed, avoiding eating 2-3 hours before bedtime, and avoiding triggering foods such as spicy/acidic/fatty foods . Patient self care activities - Over the next 90 days, patient will: o Continue current medication  Medication management . Pharmacist Clinical Goal(s): o Over the next 90 days, patient will work with PharmD and providers to maintain optimal medication adherence . [facility] . Interventions o Comprehensive medication review performed. o Continue current medication management strategy . Patient self care activities - Over the next 90 days, patient will: o Take medications as prescribed o Report any questions  or concerns to PharmD and/or provider(s)  Initial goal documentation       SDOH Interventions     Most Recent Value  SDOH Interventions  Financial Strain Interventions Intervention Not Indicated  Transportation Interventions Intervention Not Indicated      Asthma   Patient has failed these meds in past: none Patient is currently controlled on the following medications:  . Advair 100-50 mcg/dose 1 puff twice daily - 90 DS $147  . Albuterol HFA 1 puff every 6 hours PRN    Using maintenance inhaler regularly? Yes  Frequency of rescue inhaler use:  1-2 months ago  We discussed:  proper inhaler technique; cost of Advair and generic availablility  Plan Patient will discuss with pharmacy about switching to generic Advair to reduce cost.  Continue current medications   Hypertension   BP goal is:  <140/90  Office blood pressures are  BP Readings from Last 3 Encounters:  12/07/19 (!) 146/80  07/08/19 (!) 155/78  06/10/19 124/70   Patient checks BP at home infrequently Patient home BP readings are ranging: n/a  Patient has failed these meds in the past: none Patient is currently controlled on the following medications:   . Losartan 100 mg 1 tablet daily - in AM   We discussed diet and exercise extensively -Diet: Patient reports limiting salt intake; discussed reading nutrition labels for sodium content and aiming for < 1500 mg/day -Exercise: recommended 150 mins/week; patient does not enjoy just walking - discussed trying water aerobics or joining exercise classes at the senior center - We discussed the importance of checking blood pressure at home regularly  Plan  Continue current medications    Hypothyroidism   Lab Results  Component Value Date/Time   TSH 2.21 06/10/2019 10:16 AM   TSH 1.95 11/04/2017 11:56 AM   FREET4 0.82 04/19/2015 09:47 AM   FREET4 1.0 07/16/2009 03:14 PM    Patient has failed these meds in past: none Patient is currently controlled on  the following medications:  . Levothyroxine 100 mcg 1 tablet daily  We discussed:  administration of medication on an empty stomach separate from all other medicines  Plan Managed by Dr. Chalmers Cater. Continue current medications  Hyperlipidemia   LDL goal < 100  Lipid Panel     Component Value Date/Time   CHOL 195 06/10/2019 1016   TRIG 90.0 06/10/2019 1016   HDL 42.90 06/10/2019 1016   LDLCALC 134 (H) 06/10/2019 1016   LDLDIRECT 141.7 04/03/2011 1043    Hepatic Function Latest Ref Rng & Units 06/10/2019 11/04/2017 10/17/2016  Total Protein 6.0 - 8.3 g/dL 6.8 6.2 6.3  Albumin 3.5 - 5.2 g/dL 3.6 3.5 3.6  AST 0 - 37 U/L '12 11 13  ' ALT 0 - 35  U/L '15 14 17  ' Alk Phosphatase 39 - 117 U/L 101 87 87  Total Bilirubin 0.2 - 1.2 mg/dL 0.5 0.7 0.7  Bilirubin, Direct 0.0 - 0.3 mg/dL 0.1 0.1 0.1     The 10-year ASCVD risk score Mikey Bussing DC Jr., et al., 2013) is: 13.9%   Values used to calculate the score:     Age: 54 years     Sex: Female     Is Non-Hispanic African American: No     Diabetic: No     Tobacco smoker: No     Systolic Blood Pressure: 286 mmHg     Is BP treated: Yes     HDL Cholesterol: 42.9 mg/dL     Total Cholesterol: 195 mg/dL   Patient has failed these meds in past: none (records indicate Lipitor and Mevacor but patient denies ever taking these)  Patient is currently uncontrolled on the following medications:  . No medications  We discussed:  diet and exercise extensively -Diet: discussed limiting butter intake and switching to olive oil if oil is necessary  -Diet: recommended consuming 10-25 g of fiber per day to help lower cholesterol through foods such as green leafy vegetables, fruit, and oatmeal (patient endorses eating a lot of oatmeal) -Exercise: discussed the benefits of exercise and impact on lowering cholesterol  Plan Per PCP note patient is borderline for risk (was 10.2% ASCVD risk at last PCP appointment) - will plan to reassess at follow up and could consider  CAC scoring.  Continue control with diet and exercise   GERD   Patient has failed these meds in past: none Patient is currently controlled on the following medications:  . Omeprazole 20 mg every other day  We discussed:  Non pharmacolgic management (elevating head of bed, avoiding eating 2-3 hours before bedtime, and avoiding triggering foods such as spicy/acidic/fatty foods); recommended for patient to use Tums PRN if needed for symptom management  Plan Readress complete taper at follow up.  Continue current medications   Allergic rhinitis   Patient is currently controlled on the following medications:  . Claritin 10 mg 1 tablet daily   Plan  Continue current medications  Vitamins/supplements   Last vitamin D Lab Results  Component Value Date   VD25OH 38.91 04/19/2015   Patient is currently controlled on the following medications:  [name] Multivitamin 1 tablet daily . Vitamin D 2000 units daily  We discussed:  Symptoms of low vitamin D such as muscle cramps/spasms  Plan Recommend checking vitamin D level Continue current medications   Jaw pain   Patient is currently controlled on the following medications:  . Acetaminophen 650 mg 2 tablet PRN (before bed for jaw pain)  We discussed:  The maximum recommended dose of 3,000 mg/day of tylenol; discussed that Tylenol is found in many over the counter products (especially cough and cold medicine)   Plan  Continue current medications   Vaccines   Reviewed and discussed patient's vaccination history.    Immunization History  Administered Date(s) Administered  . Fluad Quad(high Dose 65+) 12/07/2019  . Influenza Split 11/04/2012, 01/04/2014  . Influenza, High Dose Seasonal PF 11/06/2016, 01/26/2018, 02/16/2019  . Influenza,inj,Quad PF,6+ Mos 04/19/2015, 12/27/2015  . Influenza-Unspecified 11/06/2016  . PFIZER SARS-COV-2 Vaccination 11/18/2018, 12/15/2018  . Pneumococcal Conjugate-13 04/03/2016  . Pneumococcal  Polysaccharide-23 01/31/2010  . Tdap 07/28/2013  . Zoster Recombinat (Shingrix) 06/13/2017, 09/27/2017    Plan  Recommended patient receive Pneumovax 23 vaccine in office.   Medication Management   [facility] Uses pill box? Yes  Pt endorses 95% compliance - missed Sunday  We discussed: Discussed benefits of medication synchronization, packaging and delivery as well as enhanced pharmacist oversight with [facility].    Plan Continue current medication management strategy Reassess [facility] at follow up - patient did not onboard at this time due to plastic of packaging.   Follow up:  3 month phone visit  [name], PharmD Clinical Pharmacist [place] [facility] [place] [phone]

## 2020-01-11 ENCOUNTER — Other Ambulatory Visit: Payer: Self-pay | Admitting: Obstetrics and Gynecology

## 2020-01-11 DIAGNOSIS — R928 Other abnormal and inconclusive findings on diagnostic imaging of breast: Secondary | ICD-10-CM

## 2020-01-12 NOTE — Patient Instructions (Addendum)
Hi Amy Stokes,   It was lovely getting to meet you face to face! As we discussed, please reach out to your pharmacy to see if you can switch to generic Advair to cut down on the cost of the inhaler. I want you to also start checking your blood pressure at least a few times a month to make sure your medicines are working properly and work on starting to exercise again. A few minutes each day can make a big difference! I know we spent a decent amount of time talking about it but I also attached some information about ways to reduce cholesterol through your diet and I hope it's helpful.  Please call me if you need anything or have any questions before our next touch base!  Best, Maddie  [name], PharmD Clinical Pharmacist [facility]    Visit Information  Goals Addressed            This Visit's Progress   . Pharmacy Care Plan       CARE PLAN ENTRY (see longitudinal plan of care for additional care plan information)  Current Barriers:  . Chronic Disease Management support, education, and care coordination needs related to Hypertension, Hyperlipidemia, GERD, Asthma, Hypothyroidism, and jaw pain   Hypertension BP Readings from Last 3 Encounters:  12/07/19 (!) 146/80  07/08/19 (!) 155/78  06/10/19 124/70   . Pharmacist Clinical Goal(s): o Over the next 90 days, patient will work with PharmD and providers to achieve BP goal <140/90 . Current regimen:  o Losartan 100 mg 1 tablet daily  . Interventions: o We discussed limiting salt intake and reading nutrition labels for sodium content o We discussed exercising with a target of 150 minutes per week to help lower blood pressure o We discussed the importance of checking blood pressure at home regularly while taking BP medication . Patient self care activities - Over the next 90 days, patient will: o Check blood pressure weekly, document, and provide at future appointments o Ensure daily salt  intake < 2300 mg/day  Hyperlipidemia Lab Results  Component Value Date/Time   LDLCALC 134 (H) 06/10/2019 10:16 AM   LDLDIRECT 141.7 04/03/2011 10:43 AM   . Pharmacist Clinical Goal(s): o Over the next 90 days, patient will work with PharmD and providers to achieve LDL goal < 100 . Current regimen:  o No medications . Interventions: o We discussed making dietary changes such as opting for baking instead of frying foods o We discussed incorporating more fiber in your diet through foods such as green leafy vegetables, fruit and oatmeal to lower cholesterol . Patient self care activities - Over the next 90 days, patient will: o Work on making lifestyle changes to lower cholesterol  Hypothyroidism . Pharmacist Clinical Goal(s): o Over the next 90 days, patient will work with PharmD and providers to maintain TSH between 0.4 to 4.5 . Current regimen:  o Levothyroxine 100 mcg 1 tablet daily . Interventions: o We discussed taking levothyroxine on an empty stomach in the morning separate from all other medications  . Patient self care activities - Over the next 90 days, patient will: o Continue current medications  Asthma . Pharmacist Clinical Goal(s) o Over the next 90 days, patient will work with PharmD and providers to manage symptoms of asthma . Current regimen:  . Advair 100-50 mcg/dose 1 puff twice daily  . Albuterol HFA 1 puff every 6 hours as needed . Interventions: o We discussed making sure to rinse your  mouth out and spit with water after every use of inhaler . Patient self care activities - Over the next 90 days, patient will: o Continue current medications o Discuss with pharmacy about switching to generic Advair to cut down on the cost  Jaw pain . Pharmacist Clinical Goal(s) o Over the next 90 days, patient will work with PharmD and providers to control pain . Current regimen:  o Acetaminophen 650 mg 2 tablet before bedtime . Interventions: o We discussed the maximum  recommended dose of Tylenol is 3,000 mg/day (no more than 4 tablets of extra strength)  o We discussed that Tylenol can be found in many over the counter cough and cold products . Patient self care activities - Over the next 90 days, patient will: o Continue current management  GERD . Pharmacist Clinical Goal(s) o Over the next 90 days, patient will work with PharmD and providers to manage symptoms of heartburn or indigestion . Current regimen:  o Omeprazole 20 mg every other day . Interventions: o We discussed non medication related things to do to minimize symptoms of heartburn such as elevating head of bed, avoiding eating 2-3 hours before bedtime, and avoiding triggering foods such as spicy/acidic/fatty foods . Patient self care activities - Over the next 90 days, patient will: o Continue current medication  Medication management . Pharmacist Clinical Goal(s): o Over the next 90 days, patient will work with PharmD and providers to maintain optimal medication adherence . Current pharmacy: [facility] . Interventions o Comprehensive medication review performed. o Continue current medication management strategy . Patient self care activities - Over the next 90 days, patient will: o Take medications as prescribed o Report any questions or concerns to PharmD and/or provider(s)  Initial goal documentation        Amy Stokes was given information about Chronic Care Management services today including:  1. CCM service includes personalized support from designated clinical staff supervised by her physician, including individualized plan of care and coordination with other care providers 2. 24/7 contact phone numbers for assistance for urgent and routine care needs. 3. Standard insurance, coinsurance, copays and deductibles apply for chronic care management only during months in which we provide at least 20 minutes of these services. Most insurances cover these services at 100%,  however patients may be responsible for any copay, coinsurance and/or deductible if applicable. This service may help you avoid the need for more expensive face-to-face services. 4. Only one practitioner may furnish and bill the service in a calendar month. 5. The patient may stop CCM services at any time (effective at the end of the month) by phone call to the office staff.  Patient agreed to services and verbal consent obtained.   The patient verbalized understanding of instructions provided today and agreed to receive a mailed copy of patient instruction and/or educational materials. Telephone follow up appointment with pharmacy team member scheduled for: 3 months   Fat and Cholesterol Restricted Eating Plan Eating a diet that limits fat and cholesterol may help lower your risk for heart disease and other conditions. Your body needs fat and cholesterol for basic functions, but eating too much of these things can be harmful to your health. Your health care provider may order lab tests to check your blood fat (lipid) and cholesterol levels. This helps your health care provider understand your risk for certain conditions and whether you need to make diet changes. Work with your health care provider or dietitian to make an eating plan  that is right for you. Your plan includes:  Limit your fat intake to ______% or less of your total calories a day.  Limit your saturated fat intake to ______% or less of your total calories a day.  Limit the amount of cholesterol in your diet to less than _________mg a day.  Eat ___________ g of fiber a day. What are tips for following this plan? General guidelines   If you are overweight, work with your health care provider to lose weight safely. Losing just 5-10% of your body weight can improve your overall health and help prevent diseases such as diabetes and heart disease.  Avoid: ? Foods with added sugar. ? Fried foods. ? Foods that contain partially  hydrogenated oils, including stick margarine, some tub margarines, cookies, crackers, and other baked goods.  Limit alcohol intake to no more than 1 drink a day for nonpregnant women and 2 drinks a day for men. One drink equals 12 oz of beer, 5 oz of wine, or 1 oz of hard liquor. Reading food labels  Check food labels for: ? Trans fats, partially hydrogenated oils, or high amounts of saturated fat. Avoid foods that contain saturated fat and trans fat. ? The amount of cholesterol in each serving. Try to eat no more than 200 mg of cholesterol each day. ? The amount of fiber in each serving. Try to eat at least 20-30 g of fiber each day.  Choose foods with healthy fats, such as: ? Monounsaturated and polyunsaturated fats. These include olive and canola oil, flaxseeds, walnuts, almonds, and seeds. ? Omega-3 fats. These are found in foods such as salmon, mackerel, sardines, tuna, flaxseed oil, and ground flaxseeds.  Choose grain products that have whole grains. Look for the word "whole" as the first word in the ingredient list. Cooking  Cook foods using methods other than frying. Baking, boiling, grilling, and broiling are some healthy options.  Eat more home-cooked food and less restaurant, buffet, and fast food.  Avoid cooking using saturated fats. ? Animal sources of saturated fats include meats, butter, and cream. ? Plant sources of saturated fats include palm oil, palm kernel oil, and coconut oil. Meal planning   At meals, imagine dividing your plate into fourths: ? Fill one-half of your plate with vegetables and green salads. ? Fill one-fourth of your plate with whole grains. ? Fill one-fourth of your plate with lean protein foods.  Eat fish that is high in omega-3 fats at least two times a week.  Eat more foods that contain fiber, such as whole grains, beans, apples, broccoli, carrots, peas, and barley. These foods help promote healthy cholesterol levels in the  blood. Recommended foods Grains  Whole grains, such as whole wheat or whole grain breads, crackers, cereals, and pasta. Unsweetened oatmeal, bulgur, barley, quinoa, or brown rice. Corn or whole wheat flour tortillas. Vegetables  Fresh or frozen vegetables (raw, steamed, roasted, or grilled). Green salads. Fruits  All fresh, canned (in natural juice), or frozen fruits. Meats and other protein foods  Ground beef (85% or leaner), grass-fed beef, or beef trimmed of fat. Skinless chicken or [name]. Ground chicken or [name]. Pork trimmed of fat. All fish and seafood. Egg whites. Dried beans, peas, or lentils. Unsalted nuts or seeds. Unsalted canned beans. Natural nut butters without added sugar and oil. Dairy  Low-fat or nonfat dairy products, such as skim or 1% milk, 2% or reduced-fat cheeses, low-fat and fat-free ricotta or cottage cheese, or plain low-fat and nonfat yogurt.  Fats and oils  Tub margarine without trans fats. Light or reduced-fat mayonnaise and salad dressings. Avocado. Olive, canola, sesame, or safflower oils. The items listed above may not be a complete list of recommended foods or beverages. Contact your dietitian for more options. Foods to avoid Grains  White bread. White pasta. White rice. Cornbread. Bagels, pastries, and croissants. Crackers and snack foods that contain trans fat and hydrogenated oils. Vegetables  Vegetables cooked in cheese, cream, or butter sauce. Fried vegetables. Fruits  Canned fruit in heavy syrup. Fruit in cream or butter sauce. Fried fruit. Meats and other protein foods  Fatty cuts of meat. Ribs, chicken wings, bacon, sausage, bologna, salami, chitterlings, fatback, hot dogs, bratwurst, and packaged lunch meats. Liver and organ meats. Whole eggs and egg yolks. Chicken and [name] with skin. Fried meat. Dairy  Whole or 2% milk, cream, half-and-half, and cream cheese. Whole milk cheeses. Whole-fat or sweetened yogurt. Full-fat cheeses. Nondairy  creamers and whipped toppings. Processed cheese, cheese spreads, and cheese curds. Beverages  Alcohol. Sugar-sweetened drinks such as sodas, lemonade, and fruit drinks. Fats and oils  Butter, stick margarine, lard, shortening, ghee, or bacon fat. Coconut, palm kernel, and palm oils. Sweets and desserts  Corn syrup, sugars, honey, and molasses. Candy. Jam and jelly. Syrup. Sweetened cereals. Cookies, pies, cakes, donuts, muffins, and ice cream. The items listed above may not be a complete list of foods and beverages to avoid. Contact your dietitian for more information. Summary  Your body needs fat and cholesterol for basic functions. However, eating too much of these things can be harmful to your health.  Work with your health care provider and dietitian to follow a diet low in fat and cholesterol. Doing this may help lower your risk for heart disease and other conditions.  Choose healthy fats, such as monounsaturated and polyunsaturated fats, and foods high in omega-3 fatty acids.  Eat fiber-rich foods, such as whole grains, beans, peas, fruits, and vegetables.  Limit or avoid alcohol, fried foods, and foods high in saturated fats, partially hydrogenated oils, and sugar. This information is not intended to replace advice given to you by your health care provider. Make sure you discuss any questions you have with your health care provider. Document Revised: 02/27/2017 Document Reviewed: 12/02/2016 Elsevier Patient Education  [place].

## 2020-01-16 ENCOUNTER — Other Ambulatory Visit: Payer: Self-pay | Admitting: Obstetrics and Gynecology

## 2020-01-16 ENCOUNTER — Ambulatory Visit
Admission: RE | Admit: 2020-01-16 | Discharge: 2020-01-16 | Disposition: A | Discharge status: Home / Self Care | Payer: Medicare HMO | Source: Ambulatory Visit | Attending: Obstetrics and Gynecology | Admitting: Obstetrics and Gynecology

## 2020-01-16 ENCOUNTER — Other Ambulatory Visit: Payer: Self-pay | Admitting: Internal Medicine

## 2020-01-16 ENCOUNTER — Telehealth: Payer: Self-pay | Admitting: Internal Medicine

## 2020-01-16 ENCOUNTER — Other Ambulatory Visit: Payer: Self-pay

## 2020-01-16 DIAGNOSIS — R928 Other abnormal and inconclusive findings on diagnostic imaging of breast: Secondary | ICD-10-CM

## 2020-01-16 DIAGNOSIS — N6320 Unspecified lump in the left breast, unspecified quadrant: Secondary | ICD-10-CM

## 2020-01-16 NOTE — Telephone Encounter (Signed)
Left a detailed message at the pts cell number to call and schedule a visit as below.

## 2020-01-16 NOTE — Telephone Encounter (Signed)
Schedule video or in person visit to discuss management

## 2020-01-16 NOTE — Telephone Encounter (Signed)
Spoke with pt scheduled a MyChart video visit with Dr Regis Bill. For tommorrow

## 2020-01-16 NOTE — Telephone Encounter (Signed)
Pt's asthma is acting up some thins weekend and had to use the Albuterol inhaler, so pt is wanting to increase her Advair to 250.  Please give patient a call once you know.

## 2020-01-17 ENCOUNTER — Encounter: Payer: Self-pay | Admitting: Internal Medicine

## 2020-01-17 ENCOUNTER — Telehealth (INDEPENDENT_AMBULATORY_CARE_PROVIDER_SITE_OTHER): Payer: Medicare HMO | Admitting: Internal Medicine

## 2020-01-17 VITALS — BP 126/66

## 2020-01-17 DIAGNOSIS — Z79899 Other long term (current) drug therapy: Secondary | ICD-10-CM | POA: Diagnosis not present

## 2020-01-17 DIAGNOSIS — Z87898 Personal history of other specified conditions: Secondary | ICD-10-CM | POA: Diagnosis not present

## 2020-01-17 DIAGNOSIS — J988 Other specified respiratory disorders: Secondary | ICD-10-CM | POA: Diagnosis not present

## 2020-01-17 MED ORDER — ALBUTEROL SULFATE HFA 108 (90 BASE) MCG/ACT IN AERS: 2.0000 | INHALATION_SPRAY | Freq: Four times a day (QID) | RESPIRATORY_TRACT | 2 refills | Status: DC | PRN

## 2020-01-17 NOTE — Progress Notes (Signed)
Virtual Visit via Video Note  I connected with@ on 01/17/20 at  1:30 PM EDT by a video enabled telemedicine application and verified that I am speaking with the correct person using two identifiers. Location patient: home Location provider:work office Persons participating in the virtual visit: patient, provider  WIth national recommendations  regarding COVID 19 pandemic   video visit is advised over in office visit for this patient.  Patient aware  of the limitations of evaluation and management by telemedicine and  availability of in person appointments. and agreed to proceed.   HPI: Amy Stokes presents for video visit concern about asthma and wheezing  Onset 3 days ago    Dough wheeze   On advair seems like rti now  Cough wheezy ocass productive no blood fever  .  Using albuterol now and seems to respond  2 x this am and  10 am use today seems ok  No specific expos rues  Outside  Pancake breakfast theis weekend .     utd on covid vaccine  Had a booster ( blinded but thinks active) ROS: See pertinent positives and negatives per HPI.  Past Medical History:  Diagnosis Date  . Arthritis    no meds  . Asthma   . Edema   . Fibroids   . Hyperlipidemia   . Hypertension   . Obesity   . Sleep apnea    wears cpap   . Thyroid nodule 01/2009   left discovered on chest ct; needle bx "indeterminant" cannon r/o follicular neoplasm     Past Surgical History:  Procedure Laterality Date  . ABDOMINAL HYSTERECTOMY Bilateral 08/02/2013   Procedure: TOTAL ABDOMINAL HYSTERECTOMY WITH BILATERAL SALPINGO-OOPHORECTOMY ;  Surgeon: Terrance Mass, MD;  Location: [facility];  Service: Gynecology;  Laterality: Bilateral;    Dr. Phineas Real to assist.  Dr. Zella Richer to follow with an umbilical hernia repair. He will need an additional 1 1/2 hours.   . COLONOSCOPY  12/27/2007   Sharlett Iles   . HERNIA REPAIR    . MOUTH SURGERY    . thyroid needle bx    . TONSILLECTOMY    . UMBILICAL HERNIA REPAIR  N/A 08/02/2013   Procedure: HERNIA REPAIR UMBILICAL ADULT WITH MESH;  Surgeon: Odis Hollingshead, MD;  Location: [facility];  Service: General;  Laterality: N/A;    Family History  Problem Relation Age of Onset  . Arthritis Mother   . Other Mother        neurological disorder  . Pancreatic cancer Mother   . Heart disease Father   . Thyroid nodules Sister   . Heart disease Brother        x2 1 brother deceased  . Colon cancer Neg Hx   . Colon polyps Neg Hx   . Esophageal cancer Neg Hx   . Rectal cancer Neg Hx   . Stomach cancer Neg Hx     Social History   Tobacco Use  . Smoking status: Former Smoker    Packs/day: 1.00    Years: 1.00    Pack years: 1.00    Types: Cigarettes    Quit date: 04/29/1971    Years since quitting: 48.7  . Smokeless tobacco: Never Used  Vaping Use  . Vaping Use: Never used  Substance Use Topics  . Alcohol use: Yes    Alcohol/week: 0.0 standard drinks    Comment: occasionally  . Drug use: No      Current Outpatient Medications:  .  acetaminophen (  TYLENOL) 650 MG CR tablet, Take 650 mg by mouth every 8 (eight) hours as needed for pain., Disp: , Rfl:  .  ADVAIR DISKUS 100-50 MCG/DOSE AEPB, TAKE 1 PUFF BY MOUTH TWICE A DAY *RINSE MOUTH AFTER USE*, Disp: 180 each, Rfl: 1 .  albuterol (PROAIR HFA) 108 (90 Base) MCG/ACT inhaler, Inhale 2 puffs into the lungs every 6 (six) hours as needed for wheezing., Disp: 1 each, Rfl: 2 .  Cholecalciferol 50 MCG (2000 UT) CAPS, Take 1 capsule by mouth daily. PATIENT USING OIL AND NOT CAPSULES , Disp: , Rfl:  .  clobetasol ointment (TEMOVATE) 9.73 %, Apply 1 application topically as needed. , Disp: , Rfl:  .  levothyroxine (SYNTHROID) 100 MCG tablet, Take 100 mcg by mouth every morning., Disp: , Rfl:  .  loratadine (CLARITIN) 10 MG tablet, Take 10 mg by mouth daily., Disp: , Rfl:  .  losartan (COZAAR) 100 MG tablet, Take 1 tablet (100 mg total) by mouth daily., Disp: 90 tablet, Rfl: 3 .  MULTIPLE VITAMIN PO, Take 1 tablet  by mouth daily., Disp: , Rfl:  .  omeprazole (PRILOSEC) 20 MG capsule, Take 20 mg by mouth every other day. , Disp: , Rfl:  .  psyllium (METAMUCIL) 58.6 % powder, Take 1 packet by mouth daily., Disp: , Rfl:   EXAM: BP Readings from Last 3 Encounters:  01/17/20 126/66  12/07/19 (!) 146/80  07/08/19 (!) 155/78    VITALS per patient if applicable: bp 532/99 and 126/66 no fever  No toxic   GENERAL: alert, oriented, appears well and in no acute distress ocass wheezy bronchial cough   HEENT: atraumatic, conjunttiva clear, no obvious abnormalities on inspection of external nose and ears  NECK: normal movements of the head and neck  LUNGS: on inspection no signs of respiratory distress, breathing rate appears normal, no obvious gross SOB, gasping or wheezing  CV: no obvious cyanosis  PSYCH/NEURO: pleasant and cooperative, no obvious depression or anxiety, speech and thought processing grossly intact Lab Results  Component Value Date   WBC 7.6 06/10/2019   HGB 13.2 06/10/2019   HCT 40.3 06/10/2019   PLT 257.0 06/10/2019   GLUCOSE 95 06/10/2019   CHOL 195 06/10/2019   TRIG 90.0 06/10/2019   HDL 42.90 06/10/2019   LDLDIRECT 141.7 04/03/2011   LDLCALC 134 (H) 06/10/2019   ALT 15 06/10/2019   AST 12 06/10/2019   NA 138 06/10/2019   K 4.1 06/10/2019   CL 99 06/10/2019   CREATININE 0.59 06/10/2019   BUN 15 06/10/2019   CO2 34 (H) 06/10/2019   TSH 2.21 06/10/2019   INR 0.98 08/02/2013   HGBA1C 6.0 06/10/2019    ASSESSMENT AND PLAN:  Discussed the following assessment and plan:    ICD-10-CM   1. Wheezing-associated respiratory infection (WARI)  J98.8    appears viral can trigger flair respinding to albuterol   2. Medication management  Z79.899   3. Hx of wheezing  Z87.898    Get covid testing  Follow  If needed consider pred burst  Other check if alarm sx pna etc  Counseled.   Expectant management and discussion of plan and treatment with opportunity to ask questions and  all were answered. The patient agreed with the plan and demonstrated an understanding of the instructions.   Advised to call back or seek an in-person evaluation if worsening  or having  further concerns . Return if symptoms worsen or fail to improve as expected. 32 minutes review  counsel and plan    Shanon Ace, MD

## 2020-01-18 DIAGNOSIS — Z20822 Contact with and (suspected) exposure to covid-19: Secondary | ICD-10-CM | POA: Diagnosis not present

## 2020-01-19 ENCOUNTER — Telehealth: Payer: Self-pay | Admitting: *Deleted

## 2020-01-19 NOTE — Telephone Encounter (Signed)
Patient called just to let Dr Regis Bill know that her covid test came back negative.

## 2020-01-20 NOTE — Telephone Encounter (Signed)
Glad.  it was negative  . Let us know if  Not getting better  In the next week as we discussed or as needed .

## 2020-01-24 NOTE — Telephone Encounter (Signed)
Spoke with pt state that she is doing better and feels good, pt aware to call the office if any need arise

## 2020-02-28 DIAGNOSIS — Z01419 Encounter for gynecological examination (general) (routine) without abnormal findings: Secondary | ICD-10-CM | POA: Diagnosis not present

## 2020-02-28 DIAGNOSIS — Z6841 Body Mass Index (BMI) 40.0 and over, adult: Secondary | ICD-10-CM | POA: Diagnosis not present

## 2020-04-09 ENCOUNTER — Ambulatory Visit: Payer: Medicare HMO | Admitting: Pharmacist

## 2020-04-09 ENCOUNTER — Telehealth: Payer: Self-pay | Admitting: Internal Medicine

## 2020-04-09 DIAGNOSIS — E782 Mixed hyperlipidemia: Secondary | ICD-10-CM

## 2020-04-09 DIAGNOSIS — I10 Essential (primary) hypertension: Secondary | ICD-10-CM

## 2020-04-09 NOTE — Telephone Encounter (Signed)
Patient is calling and wanted to see if the provider could put orders in to get the pneumonia vaccine, please advise. CB is [phone]

## 2020-04-09 NOTE — Chronic Care Management (AMB) (Signed)
Chronic Care Management Pharmacy  Name: DEMPSEY KNOTEK  MRN: [id number] DOB: 1950/07/09  Initial Questions: 1. Have you seen any other providers since your last visit? n/a 2. Any changes in your medicines or health? No   Chief Complaint/ HPI  Amy Stokes,  70 y.o. , female presents for their Follow-Up CCM visit with the clinical pharmacist via telephone due to COVID-19 Pandemic.  PCP : Burnis Medin, MD  Their chronic conditions include: Asthma, HTN, hypothyroidism, HLD, GERD, allergic rhinitis, jaw pain  Office Visits: -01/17/20 Shanon Ace, MD: Patient presented for video visit for wheezing.  -12/07/19 Shanon Ace, MD: Patient presented for evaluation of epigastric burning sensation. Pt stopped aspirin x 3 weeks just in case. Prescribed omeprazole short course for 4 weeks. Influenza shot given.  Consult Visit: -02/28/20 [name] (women's health): Patient presented for follow up. Unable to access notes.  -07/08/19 Patient presented to the ED with blister on toe.   Medications: Outpatient Encounter Medications as of 04/09/2020  Medication Sig  . acetaminophen (TYLENOL) 650 MG CR tablet Take 650 mg by mouth every 8 (eight) hours as needed for pain.  [name] ADVAIR DISKUS 100-50 MCG/DOSE AEPB TAKE 1 PUFF BY MOUTH TWICE A DAY *RINSE MOUTH AFTER USE*  . albuterol (PROAIR HFA) 108 (90 Base) MCG/ACT inhaler Inhale 2 puffs into the lungs every 6 (six) hours as needed for wheezing.  . Cholecalciferol 50 MCG (2000 UT) CAPS Take 1 capsule by mouth daily. PATIENT USING OIL AND NOT CAPSULES   . clobetasol ointment (TEMOVATE) 5.05 % Apply 1 application topically as needed.   [name] levothyroxine (SYNTHROID) 100 MCG tablet Take 100 mcg by mouth every morning.  . loratadine (CLARITIN) 10 MG tablet Take 10 mg by mouth daily.  [name] losartan (COZAAR) 100 MG tablet Take 1 tablet (100 mg total) by mouth daily.  . MULTIPLE VITAMIN PO Take 1 tablet by mouth daily.  . psyllium (METAMUCIL) 58.6 %  powder Take 1 packet by mouth daily.  . [DISCONTINUED] omeprazole (PRILOSEC) 20 MG capsule Take 20 mg by mouth every other day.    No facility-administered encounter medications on file as of 04/09/2020.   Patient reports she is still having muscle spasms every once in a while but they are more sporadic and less than the last time we discussed them.  Current Diagnosis/Assessment:  Goals Addressed            This Visit's Progress   . Pharmacy Care Plan       CARE PLAN ENTRY (see longitudinal plan of care for additional care plan information)  Current Barriers:  . Chronic Disease Management support, education, and care coordination needs related to Hypertension, Hyperlipidemia, GERD, Asthma, Hypothyroidism, and jaw pain   Hypertension BP Readings from Last 3 Encounters:  01/17/20 126/66  12/07/19 (!) 146/80  07/08/19 (!) 155/78   . Pharmacist Clinical Goal(s): o Over the next 120 days, patient will work with PharmD and providers to achieve BP goal <140/90 . Current regimen:  o Losartan 100 mg 1 tablet daily  . Interventions: o We discussed the importance of checking blood pressure at home regularly while taking BP medication . Patient self care activities - Over the next 120 days, patient will: o Check blood pressure weekly, document, and provide at future appointments o Ensure daily salt intake < 2300 mg/day  Hyperlipidemia Lab Results  Component Value Date/Time   LDLCALC 134 (H) 06/10/2019 10:16 AM   LDLDIRECT 141.7 04/03/2011 10:43 AM   .  Pharmacist Clinical Goal(s): o Over the next 120 days, patient will work with PharmD and providers to achieve LDL goal < 100 . Current regimen:  o No medications . Interventions: o Discussed lowering cholesterol through diet by: [name] Limiting foods with cholesterol such as liver and other organ meats, egg yolks, shrimp, and whole milk dairy products . Avoiding saturated fats and trans fats and incorporating healthier fats, such as  lean meat, nuts, and unsaturated oils like canola and olive oils . Eating foods with soluble fiber such as whole-grain cereals such as oatmeal and oat bran, fruits such as apples, bananas, oranges, pears, and prunes, legumes such as kidney beans, lentils, chick peas, black-eyed peas, and lima beans, and green leafy vegetables . Limiting alcohol intake . Patient self care activities - Over the next 120 days, patient will: o Work on making lifestyle changes to lower cholesterol  Hypothyroidism . Pharmacist Clinical Goal(s): o Over the next 120 days, patient will work with PharmD and providers to maintain TSH between 0.4 to 4.5 . Current regimen:  o Levothyroxine 100 mcg 1 tablet daily . Interventions: o We discussed taking levothyroxine on an empty stomach in the morning separate from all other medications  . Patient self care activities - Over the next 120 days, patient will: o Continue current medications  Asthma . Pharmacist Clinical Goal(s) o Over the next 120 days, patient will work with PharmD and providers to manage symptoms of asthma . Current regimen:  . Advair 100-50 mcg/dose 1 puff twice daily  . Albuterol HFA 1 puff every 6 hours as needed . Interventions: o We discussed making sure to rinse your mouth out and spit with water after every use of inhaler . Patient self care activities - Over the next 120 days, patient will: o Continue current medications o Discuss with pharmacy about switching to generic Advair to cut down on the cost  Jaw pain . Pharmacist Clinical Goal(s) o Over the next 120 days, patient will work with PharmD and providers to control pain . Current regimen:  o Acetaminophen 650 mg 2 tablet before bedtime . Interventions: o We discussed the maximum recommended dose of Tylenol is 3,000 mg/day (no more than 4 tablets of extra strength)  o We discussed that Tylenol can be found in many over the counter cough and cold products . Patient self care activities  - Over the next 120 days, patient will: o Continue current management  Medication management . Pharmacist Clinical Goal(s): o Over the next 120 days, patient will work with PharmD and providers to maintain optimal medication adherence . [facility] . Interventions o Comprehensive medication review performed. o Continue current medication management strategy . Patient self care activities - Over the next 120 days, patient will: o Take medications as prescribed o Report any questions or concerns to PharmD and/or provider(s)  Please see past updates related to this goal by clicking on the "Past Updates" button in the selected goal          Asthma   Patient has failed these meds in past: none Patient is currently controlled on the following medications:  . Advair 100-50 mcg/dose 1 puff twice daily - 90 DS $147  . Albuterol HFA 1 puff every 6 hours PRN    Using maintenance inhaler regularly? Yes  Frequency of rescue inhaler use:  using albuterol with respiratory infection  We discussed:  proper inhaler technique; cost of Advair and generic availability -Patient still has not looked into the  generic for Advair but plans to do so  Plan Consider increasing the dose of Advair as patient has noticed more wheezing over the past few months. Continue current medications   Hypertension   BP goal is:  <140/90  Office blood pressures are  BP Readings from Last 3 Encounters:  01/17/20 126/66  12/07/19 (!) 146/80  07/08/19 (!) 155/78   Patient checks BP at home infrequently Patient home BP readings are ranging: n/a  Patient has failed these meds in the past: none Patient is currently controlled on the following medications:   . Losartan 100 mg 1 tablet daily - in AM   We discussed diet and exercise extensively -Exercise: discussed trying water aerobics or joining exercise classes at the [facility] - We discussed the importance of checking blood pressure at  home regularly - Recommended checking BP after taking medications  Plan Patient will start checking blood pressure more regularly at home. Continue current medications    Hypothyroidism   Lab Results  Component Value Date/Time   TSH 2.21 06/10/2019 10:16 AM   TSH 1.95 11/04/2017 11:56 AM   FREET4 0.82 04/19/2015 09:47 AM   FREET4 1.0 07/16/2009 03:14 PM    Patient has failed these meds in past: none Patient is currently controlled on the following medications:  . Levothyroxine 100 mcg 1 tablet daily  We discussed:  administration of medication on an empty stomach separate from all other medicines  Plan Managed by Dr. Chalmers Cater. Continue current medications  Hyperlipidemia   LDL goal < 100  Lipid Panel     Component Value Date/Time   CHOL 195 06/10/2019 1016   TRIG 90.0 06/10/2019 1016   HDL 42.90 06/10/2019 1016   LDLCALC 134 (H) 06/10/2019 1016   LDLDIRECT 141.7 04/03/2011 1043    Hepatic Function Latest Ref Rng & Units 06/10/2019 11/04/2017 10/17/2016  Total Protein 6.0 - 8.3 g/dL 6.8 6.2 6.3  Albumin 3.5 - 5.2 g/dL 3.6 3.5 3.6  AST 0 - 37 U/L '12 11 13  ' ALT 0 - 35 U/L '15 14 17  ' Alk Phosphatase 39 - 117 U/L 101 87 87  Total Bilirubin 0.2 - 1.2 mg/dL 0.5 0.7 0.7  Bilirubin, Direct 0.0 - 0.3 mg/dL 0.1 0.1 0.1     The 10-year ASCVD risk score Mikey Bussing DC Jr., et al., 2013) is: 24.1%   Values used to calculate the score:     Age: 56 years     Sex: Female     Is Non-Hispanic African American: No     Diabetic: No     Tobacco smoker: No     Systolic Blood Pressure: 193 mmHg     Is BP treated: Yes     HDL Cholesterol: 42.9 mg/dL     Total Cholesterol: 195 mg/dL   Patient has failed these meds in past: none (records indicate Lipitor and Mevacor but patient denies ever taking these)  Patient is currently uncontrolled on the following medications:  . No medications  We discussed:  diet and exercise extensively -Diet: discussed limiting butter intake and switching to  olive oil if oil is necessary  -Diet: recommended consuming 10-25 g of fiber per day to help lower cholesterol through foods such as green leafy vegetables, fruit, and oatmeal (patient endorses eating a lot of oatmeal) -Exercise: discussed the benefits of exercise and impact on lowering cholesterol  Plan Per PCP note patient is borderline for risk (was 10.2% ASCVD risk at last PCP appointment) - recommend CAC scoring to  determine risk.  Continue control with diet and exercise   GERD   Patient has failed these meds in past: none Patient is currently controlled on the following medications:  . No medications  We discussed:  Patient completed taper with omeprazole and denies any problems with heartburn  Plan Continue current medications   Allergic rhinitis   Patient is currently controlled on the following medications:  . Claritin 10 mg 1 tablet daily   Plan  Continue current medications  Vitamins/supplements   Last vitamin D Lab Results  Component Value Date   VD25OH 38.91 04/19/2015   Patient is currently controlled on the following medications:  [name] Multivitamin 1 tablet daily . Vitamin D 2000 units daily  We discussed:  Symptoms of low vitamin D such as muscle cramps/spasms  Plan Recommend checking vitamin D level. Continue current medications   Jaw pain   Patient is currently controlled on the following medications:  . Acetaminophen 650 mg 2 tablet PRN (before bed for jaw pain)  We discussed:  The maximum recommended dose of 3,000 mg/day of tylenol; discussed that Tylenol is found in many over the counter products (especially cough and cold medicine)   Plan  Continue current medications   Vaccines   Reviewed and discussed patient's vaccination history.    Immunization History  Administered Date(s) Administered  . Fluad Quad(high Dose 65+) 12/07/2019  . Influenza Split 11/04/2012, 01/04/2014  . Influenza, High Dose Seasonal PF 11/06/2016, 01/26/2018,  02/16/2019  . Influenza,inj,Quad PF,6+ Mos 04/19/2015, 12/27/2015  . Influenza-Unspecified 11/06/2016, 12/07/2019  . PFIZER(Purple Top)SARS-COV-2 Vaccination 11/18/2018, 12/15/2018, 11/03/2019  . Pneumococcal Conjugate-13 04/03/2016  . Pneumococcal Polysaccharide-23 01/31/2010, 04/11/2020  . Tdap 07/28/2013  . Zoster Recombinat (Shingrix) 06/13/2017, 09/27/2017    Plan  Recommended patient receive Pneumovax 23 vaccine in office/at pharmacy.   Medication Management   [facility] Uses pill box? Yes  Pt endorses 95% compliance - missed Sunday  We discussed: Discussed benefits of medication synchronization, packaging and delivery as well as enhanced pharmacist oversight with [employer].    Plan Continue current medication management strategy    Follow up:  4 month phone visit   [name], PharmD Clinical Pharmacist [place] [facility] [place] [phone]

## 2020-04-09 NOTE — Telephone Encounter (Signed)
This message does not have enough information on it  Assuming she is talking about the Pneumovax 23 booster since her last 1 was in 2011. Please get more information and put the order in for Pneumovax 23 if that is correct although I do not know where she is supposed to be getting it. If it is out of pharmacy she does not need an order if she comes here needs a nurse visit.

## 2020-04-10 NOTE — Telephone Encounter (Signed)
Spoke with the pt and informed her of the message below.  Patient stated she is not sure as this was discussed during her last visit.  Nurse's visit appt scheduled for 04/11/2020 as below.

## 2020-04-11 ENCOUNTER — Other Ambulatory Visit: Payer: Self-pay

## 2020-04-11 ENCOUNTER — Ambulatory Visit (INDEPENDENT_AMBULATORY_CARE_PROVIDER_SITE_OTHER): Payer: Medicare HMO

## 2020-04-11 DIAGNOSIS — Z23 Encounter for immunization: Secondary | ICD-10-CM | POA: Diagnosis not present

## 2020-04-11 NOTE — Progress Notes (Signed)
Per orders of Dr. Regis Bill injection of Pneuovax 23 given by Tora Kindred on right deltoid. Patient tolerated injection well.

## 2020-04-24 NOTE — Patient Instructions (Addendum)
Hi Antha,  It was great getting to speak with you again today! As we discussed, please check your blood pressure at least weekly at home to make sure your medication is working properly. Also, don't forget to look into switching to the generic for Advair to see if that will save you some money on your prescription.  Please give me a call if you have any questions or need anything before our follow up!  Best, Maddie  [name], PharmD [facility] Clinical Pharmacist [place] [facility]   Visit Information  Goals Addressed            This Visit's Progress   . Pharmacy Care Plan       CARE PLAN ENTRY (see longitudinal plan of care for additional care plan information)  Current Barriers:  . Chronic Disease Management support, education, and care coordination needs related to Hypertension, Hyperlipidemia, GERD, Asthma, Hypothyroidism, and jaw pain   Hypertension BP Readings from Last 3 Encounters:  01/17/20 126/66  12/07/19 (!) 146/80  07/08/19 (!) 155/78   . Pharmacist Clinical Goal(s): o Over the next 120 days, patient will work with PharmD and providers to achieve BP goal <140/90 . Current regimen:  o Losartan 100 mg 1 tablet daily  . Interventions: o We discussed the importance of checking blood pressure at home regularly while taking BP medication . Patient self care activities - Over the next 120 days, patient will: o Check blood pressure weekly, document, and provide at future appointments o Ensure daily salt intake < 2300 mg/day  Hyperlipidemia Lab Results  Component Value Date/Time   LDLCALC 134 (H) 06/10/2019 10:16 AM   LDLDIRECT 141.7 04/03/2011 10:43 AM   . Pharmacist Clinical Goal(s): o Over the next 120 days, patient will work with PharmD and providers to achieve LDL goal < 100 . Current regimen:  o No medications . Interventions: o Discussed lowering cholesterol through diet by: [name] Limiting foods with cholesterol such as  liver and other organ meats, egg yolks, shrimp, and whole milk dairy products . Avoiding saturated fats and trans fats and incorporating healthier fats, such as lean meat, nuts, and unsaturated oils like canola and olive oils . Eating foods with soluble fiber such as whole-grain cereals such as oatmeal and oat bran, fruits such as apples, bananas, oranges, pears, and prunes, legumes such as kidney beans, lentils, chick peas, black-eyed peas, and lima beans, and green leafy vegetables . Limiting alcohol intake . Patient self care activities - Over the next 120 days, patient will: o Work on making lifestyle changes to lower cholesterol  Hypothyroidism . Pharmacist Clinical Goal(s): o Over the next 120 days, patient will work with PharmD and providers to maintain TSH between 0.4 to 4.5 . Current regimen:  o Levothyroxine 100 mcg 1 tablet daily . Interventions: o We discussed taking levothyroxine on an empty stomach in the morning separate from all other medications  . Patient self care activities - Over the next 120 days, patient will: o Continue current medications  Asthma . Pharmacist Clinical Goal(s) o Over the next 120 days, patient will work with PharmD and providers to manage symptoms of asthma . Current regimen:  . Advair 100-50 mcg/dose 1 puff twice daily  . Albuterol HFA 1 puff every 6 hours as needed . Interventions: o We discussed making sure to rinse your mouth out and spit with water after every use of inhaler . Patient self care activities - Over the next 120 days, patient will: o  Continue current medications o Discuss with pharmacy about switching to generic Advair to cut down on the cost  Jaw pain . Pharmacist Clinical Goal(s) o Over the next 120 days, patient will work with PharmD and providers to control pain . Current regimen:  o Acetaminophen 650 mg 2 tablet before bedtime . Interventions: o We discussed the maximum recommended dose of Tylenol is 3,000 mg/day (no  more than 4 tablets of extra strength)  o We discussed that Tylenol can be found in many over the counter cough and cold products . Patient self care activities - Over the next 120 days, patient will: o Continue current management  Medication management . Pharmacist Clinical Goal(s): o Over the next 120 days, patient will work with PharmD and providers to maintain optimal medication adherence . [facility] . Interventions o Comprehensive medication review performed. o Continue current medication management strategy . Patient self care activities - Over the next 120 days, patient will: o Take medications as prescribed o Report any questions or concerns to PharmD and/or provider(s)  Please see past updates related to this goal by clicking on the "Past Updates" button in the selected goal         Patient verbalizes understanding of instructions provided today and agrees to view in [place].   Telephone follow up appointment with pharmacy team member scheduled for:4 months  [name], [facility]

## 2020-05-04 ENCOUNTER — Telehealth: Payer: Self-pay

## 2020-05-04 MED ORDER — FLUTICASONE-SALMETEROL 100-50 MCG/DOSE IN AEPB: 1.0000 | INHALATION_SPRAY | Freq: Two times a day (BID) | RESPIRATORY_TRACT | 3 refills | Status: DC

## 2020-05-04 NOTE — Telephone Encounter (Signed)
-----   Message from Burnis Medin, MD sent at 05/04/2020 10:28 AM EST ----- Regarding: FW: Advair generic refill Please send in rx generic  as below  WP ----- Message ----- From: Tora Kindred, CMA Sent: 05/02/2020  12:01 PM EST To: Burnis Medin, MD Subject: FW: Advair generic refill                      Pharmacy wants genetic sent in. Please advise, thanks. -JMA  ----- Message ----- From: [name], [facility] Sent: 05/01/2020   2:54 PM EST To: Regis Bill [facility] Subject: Advair generic refill                          Hi,  [name] just called and requested for a generic Advair (Wixela) [facility]. Her pharmacy told her the other was written for the brand and they couldn't change it.  Thank you, Maddie

## 2020-05-10 ENCOUNTER — Other Ambulatory Visit: Payer: Self-pay

## 2020-05-10 ENCOUNTER — Emergency Department (HOSPITAL_COMMUNITY)
Admission: EM | Admit: 2020-05-10 | Discharge: 2020-05-10 | Disposition: A | Discharge status: Home / Self Care | Payer: Medicare HMO | Attending: Emergency Medicine | Admitting: Emergency Medicine

## 2020-05-10 ENCOUNTER — Emergency Department (HOSPITAL_COMMUNITY): Payer: Medicare HMO

## 2020-05-10 ENCOUNTER — Encounter (HOSPITAL_COMMUNITY): Payer: Self-pay | Admitting: Emergency Medicine

## 2020-05-10 DIAGNOSIS — I10 Essential (primary) hypertension: Secondary | ICD-10-CM | POA: Insufficient documentation

## 2020-05-10 DIAGNOSIS — N23 Unspecified renal colic: Secondary | ICD-10-CM

## 2020-05-10 DIAGNOSIS — N200 Calculus of kidney: Secondary | ICD-10-CM | POA: Insufficient documentation

## 2020-05-10 DIAGNOSIS — Z87891 Personal history of nicotine dependence: Secondary | ICD-10-CM | POA: Diagnosis not present

## 2020-05-10 DIAGNOSIS — N210 Calculus in bladder: Secondary | ICD-10-CM | POA: Diagnosis not present

## 2020-05-10 DIAGNOSIS — R109 Unspecified abdominal pain: Secondary | ICD-10-CM | POA: Diagnosis present

## 2020-05-10 DIAGNOSIS — Z79899 Other long term (current) drug therapy: Secondary | ICD-10-CM | POA: Insufficient documentation

## 2020-05-10 DIAGNOSIS — J45909 Unspecified asthma, uncomplicated: Secondary | ICD-10-CM | POA: Diagnosis not present

## 2020-05-10 LAB — CBC WITH DIFFERENTIAL/PLATELET
Abs Immature Granulocytes: 0.03 10*3/uL (ref 0.00–0.07)
Basophils Absolute: 0 10*3/uL (ref 0.0–0.1)
Basophils Relative: 0 %
Eosinophils Absolute: 0.3 10*3/uL (ref 0.0–0.5)
Eosinophils Relative: 3 %
HCT: 43.1 % (ref 36.0–46.0)
Hemoglobin: 13.6 g/dL (ref 12.0–15.0)
Immature Granulocytes: 0 %
Lymphocytes Relative: 20 %
Lymphs Abs: 2 10*3/uL (ref 0.7–4.0)
MCH: 28.5 pg (ref 26.0–34.0)
MCHC: 31.6 g/dL (ref 30.0–36.0)
MCV: 90.2 fL (ref 80.0–100.0)
Monocytes Absolute: 0.7 10*3/uL (ref 0.1–1.0)
Monocytes Relative: 7 %
Neutro Abs: 6.8 10*3/uL (ref 1.7–7.7)
Neutrophils Relative %: 70 %
Platelets: 267 10*3/uL (ref 150–400)
RBC: 4.78 MIL/uL (ref 3.87–5.11)
RDW: 13.6 % (ref 11.5–15.5)
WBC: 9.8 10*3/uL (ref 4.0–10.5)
nRBC: 0 % (ref 0.0–0.2)

## 2020-05-10 LAB — URINALYSIS, MICROSCOPIC (REFLEX): Bacteria, UA: NONE SEEN

## 2020-05-10 LAB — HEPATIC FUNCTION PANEL
ALT: 25 U/L (ref 0–44)
AST: 21 U/L (ref 15–41)
Albumin: 3.3 g/dL — ABNORMAL LOW (ref 3.5–5.0)
Alkaline Phosphatase: 89 U/L (ref 38–126)
Bilirubin, Direct: <0.1 mg/dL (ref 0.0–0.2)
Total Bilirubin: 0.6 mg/dL (ref 0.3–1.2)
Total Protein: 7.2 g/dL (ref 6.5–8.1)

## 2020-05-10 LAB — BASIC METABOLIC PANEL
Anion gap: 11 (ref 5–15)
BUN: 19 mg/dL (ref 8–23)
CO2: 30 mmol/L (ref 22–32)
Calcium: 8.8 mg/dL — ABNORMAL LOW (ref 8.9–10.3)
Chloride: 97 mmol/L — ABNORMAL LOW (ref 98–111)
Creatinine, Ser: 0.71 mg/dL (ref 0.44–1.00)
GFR, Estimated: >60 mL/min (ref >60)
Glucose, Bld: 129 mg/dL — ABNORMAL HIGH (ref 70–99)
Potassium: 3.6 mmol/L (ref 3.5–5.1)
Sodium: 138 mmol/L (ref 135–145)

## 2020-05-10 LAB — URINALYSIS, ROUTINE W REFLEX MICROSCOPIC
Bilirubin Urine: NEGATIVE
Glucose, UA: NEGATIVE mg/dL
Ketones, ur: NEGATIVE mg/dL
Nitrite: NEGATIVE
Protein, ur: NEGATIVE mg/dL
Specific Gravity, Urine: >1.03 — ABNORMAL HIGH (ref 1.005–1.030)
pH: 5.5 (ref 5.0–8.0)

## 2020-05-10 LAB — LIPASE, BLOOD: Lipase: 23 U/L (ref 11–51)

## 2020-05-10 MED ORDER — KETOROLAC TROMETHAMINE 30 MG/ML IJ SOLN
30.0000 mg | Freq: Once | INTRAMUSCULAR | Status: AC
  Administered 2020-05-10: 30 mg via INTRAVENOUS
  Filled 2020-05-10: qty 1

## 2020-05-10 MED ORDER — MORPHINE SULFATE (PF) 4 MG/ML IV SOLN
4.0000 mg | Freq: Once | INTRAVENOUS | Status: AC
  Administered 2020-05-10: 4 mg via INTRAVENOUS
  Filled 2020-05-10: qty 1

## 2020-05-10 MED ORDER — ONDANSETRON HCL 4 MG/2ML IJ SOLN
4.0000 mg | Freq: Once | INTRAMUSCULAR | Status: AC
  Administered 2020-05-10: 4 mg via INTRAVENOUS
  Filled 2020-05-10: qty 2

## 2020-05-10 MED ORDER — SODIUM CHLORIDE 0.9 % IV BOLUS
500.0000 mL | Freq: Once | INTRAVENOUS | Status: AC
  Administered 2020-05-10: 500 mL via INTRAVENOUS

## 2020-05-10 MED ORDER — CEPHALEXIN 250 MG PO CAPS
500.0000 mg | ORAL_CAPSULE | Freq: Once | ORAL | Status: AC
  Administered 2020-05-10: 500 mg via ORAL
  Filled 2020-05-10: qty 2

## 2020-05-10 MED ORDER — ACETAMINOPHEN 325 MG PO TABS
650.0000 mg | ORAL_TABLET | Freq: Once | ORAL | Status: AC
  Administered 2020-05-10: 650 mg via ORAL
  Filled 2020-05-10: qty 2

## 2020-05-10 MED ORDER — CEPHALEXIN 500 MG PO CAPS: 500.0000 mg | ORAL_CAPSULE | Freq: Two times a day (BID) | ORAL | 0 refills | Status: AC

## 2020-05-10 NOTE — ED Triage Notes (Signed)
Patient reports right flank pain this morning , denies hematuria or injury , no fever or chills.

## 2020-05-10 NOTE — ED Provider Notes (Signed)
[facility] Provider Note   CSN: [id number] Arrival date & time: 05/10/20  9381     History Chief Complaint  Patient presents with  . Flank Pain    Amy Stokes is a 70 y.o. female with history of obesity, hypothyroidism, hysterectomy, hernia repair, presenting to the emergency department with right-sided flank pain.  Patient reports abrupt onset of flank pain that woke her from sleep around 2 AM this morning.  Is sharp pain located directly in her right lower back.  It does not travel anywhere.  She has never had it before.  The pain has been constant.  Nothing makes it better or worse.  It is not associated movement.  She felt nauseated and some dry heaving.  She denies any diarrhea or constipation.  She denies fevers or chills.  She reports surgical history of hysterectomy and hernia repair in the abdomen.  She denies any personal history of kidney stones.  She denies dysuria or hematuria.  HPI     Past Medical History:  Diagnosis Date  . Arthritis    no meds  . Asthma   . Edema   . Fibroids   . Hyperlipidemia   . Hypertension   . Obesity   . Sleep apnea    wears cpap   . Thyroid nodule 01/2009   left discovered on chest ct; needle bx "indeterminant" cannon r/o follicular neoplasm     Patient Active Problem List   Diagnosis Date Noted  . Cough 08/09/2015  . Iliotibial band syndrome affecting left lower leg 05/26/2014  . Arthritis of left lower extremity 05/26/2014  . Low HDL (under 40) 04/08/2014  . Thyroid nodule 02/12/2014  . OSA (obstructive sleep apnea) 12/01/2013  . Seroma complicating a procedure 08/18/2013  . Bleeding gums 06/09/2011  . ALLERGY 01/31/2010  . MORBID OBESITY 07/16/2009  . Multinodular goiter (nontoxic) 01/13/2008  . RECTAL BLEEDING 11/22/2007  . UNS ADVRS EFF UNS RX MEDICINAL&BIOLOGICAL SBSTNC 07/26/2007  . HYPERLIPIDEMIA 04/06/2007  . HYPERTENSION 03/05/2007  . TACHYCARDIA 03/05/2007  . Asthma  12/28/2006    Past Surgical History:  Procedure Laterality Date  . ABDOMINAL HYSTERECTOMY Bilateral 08/02/2013   Procedure: TOTAL ABDOMINAL HYSTERECTOMY WITH BILATERAL SALPINGO-OOPHORECTOMY ;  Surgeon: Terrance Mass, MD;  Location: [facility];  Service: Gynecology;  Laterality: Bilateral;    Dr. Phineas Real to assist.  Dr. Zella Richer to follow with an umbilical hernia repair. He will need an additional 1 1/2 hours.   . COLONOSCOPY  12/27/2007   Sharlett Iles   . HERNIA REPAIR    . MOUTH SURGERY    . thyroid needle bx    . TONSILLECTOMY    . UMBILICAL HERNIA REPAIR N/A 08/02/2013   Procedure: HERNIA REPAIR UMBILICAL ADULT WITH MESH;  Surgeon: Odis Hollingshead, MD;  Location: [facility];  Service: General;  Laterality: N/A;     OB History    Gravida  1   Para  0   Term      Preterm      AB  1   Living  0     SAB  1   IAB      Ectopic      Multiple      Live Births              Family History  Problem Relation Age of Onset  . Arthritis Mother   . Other Mother        neurological disorder  .  Pancreatic cancer Mother   . Heart disease Father   . Thyroid nodules Sister   . Heart disease Brother        x2 1 brother deceased  . Colon cancer Neg Hx   . Colon polyps Neg Hx   . Esophageal cancer Neg Hx   . Rectal cancer Neg Hx   . Stomach cancer Neg Hx     Social History   Tobacco Use  . Smoking status: Former Smoker    Packs/day: 1.00    Years: 1.00    Pack years: 1.00    Types: Cigarettes    Quit date: 04/29/1971    Years since quitting: 49.0  . Smokeless tobacco: Never Used  Vaping Use  . Vaping Use: Never used  Substance Use Topics  . Alcohol use: Yes    Alcohol/week: 0.0 standard drinks    Comment: occasionally  . Drug use: No    Home Medications Prior to Admission medications   Medication Sig Start Date End Date Taking? Authorizing Provider  cephALEXin (KEFLEX) 500 MG capsule Take 1 capsule (500 mg total) by mouth 2 (two) times daily for 7 days.  05/10/20 05/17/20 Yes Halina Asano, Carola Rhine, MD  acetaminophen (TYLENOL) 650 MG CR tablet Take 650 mg by mouth every 8 (eight) hours as needed for pain.    [provider]  albuterol (PROAIR HFA) 108 (90 Base) MCG/ACT inhaler Inhale 2 puffs into the lungs every 6 (six) hours as needed for wheezing. 01/17/20   Panosh, [name], MD  Cholecalciferol 50 MCG (2000 UT) CAPS Take 1 capsule by mouth daily. PATIENT USING OIL AND NOT CAPSULES     [provider]  clobetasol ointment (TEMOVATE) 1.44 % Apply 1 application topically as needed.     [provider]  Fluticasone-Salmeterol (ADVAIR) 100-50 MCG/DOSE AEPB Inhale 1 puff into the lungs 2 (two) times daily. 05/04/20   Panosh, [name], MD  levothyroxine (SYNTHROID) 100 MCG tablet Take 100 mcg by mouth every morning. 11/07/19   [provider]  loratadine (CLARITIN) 10 MG tablet Take 10 mg by mouth daily.    [provider]  losartan (COZAAR) 100 MG tablet Take 1 tablet (100 mg total) by mouth daily. 07/04/19   Panosh, [name], MD  MULTIPLE VITAMIN PO Take 1 tablet by mouth daily.    [provider]  psyllium (METAMUCIL) 58.6 % powder Take 1 packet by mouth daily.    [provider]    Allergies    Patient has no known allergies.  Review of Systems   Review of Systems  Constitutional: Negative for chills and fever.  Respiratory: Negative for cough and shortness of breath.   Cardiovascular: Negative for chest pain and palpitations.  Gastrointestinal: Positive for nausea. Negative for abdominal pain.  Genitourinary: Positive for flank pain. Negative for difficulty urinating, dysuria and hematuria.  Musculoskeletal: Positive for back pain. Negative for arthralgias.  Skin: Negative for color change and rash.  Neurological: Negative for seizures, syncope and headaches.  All other systems reviewed and are negative.   Physical Exam Updated Vital Signs BP (!) 105/53   Pulse 81   Temp 97.7 F (36.5  C) (Oral)   Resp (!) 30   Ht 5\' 8"  (1.727 m)   Wt (!) 157 kg   SpO2 100%   BMI 52.63 kg/m   Physical Exam Constitutional:      General: She is not in acute distress.    Appearance: She is obese.  HENT:  Head: Normocephalic and atraumatic.  Eyes:     Conjunctiva/sclera: Conjunctivae normal.     Pupils: Pupils are equal, round, and reactive to light.  Cardiovascular:     Rate and Rhythm: Normal rate and regular rhythm.     Pulses: Normal pulses.  Pulmonary:     Effort: Pulmonary effort is normal. No respiratory distress.  Abdominal:     General: There is no distension.     Tenderness: There is no abdominal tenderness. There is no right CVA tenderness, left CVA tenderness or guarding.  Musculoskeletal:        General: No swelling or tenderness. Normal range of motion.  Skin:    General: Skin is warm and dry.  Neurological:     General: No focal deficit present.     Mental Status: She is alert. Mental status is at baseline.  Psychiatric:        Mood and Affect: Mood normal.        Behavior: Behavior normal.     ED Results / Procedures / Treatments   Labs (all labs ordered are listed, but only abnormal results are displayed) Labs Reviewed  BASIC METABOLIC PANEL - Abnormal; Notable for the following components:      Result Value   Chloride 97 (*)    Glucose, Bld 129 (*)    Calcium 8.8 (*)    All other components within normal limits  URINALYSIS, ROUTINE W REFLEX MICROSCOPIC - Abnormal; Notable for the following components:   APPearance TURBID (*)    Specific Gravity, Urine >1.030 (*)    Hgb urine dipstick TRACE (*)    Leukocytes,Ua TRACE (*)    All other components within normal limits  HEPATIC FUNCTION PANEL - Abnormal; Notable for the following components:   Albumin 3.3 (*)    All other components within normal limits  URINALYSIS, MICROSCOPIC (REFLEX) - Abnormal; Notable for the following components:   Non Squamous Epithelial PRESENT (*)    All other  components within normal limits  URINE CULTURE  CBC WITH DIFFERENTIAL/PLATELET  LIPASE, BLOOD    EKG None  Radiology CT Renal Stone Study  Result Date: 05/10/2020 CLINICAL DATA:  Right flank pain, nephrolithiasis EXAM: CT ABDOMEN AND PELVIS WITHOUT CONTRAST TECHNIQUE: Multidetector CT imaging of the abdomen and pelvis was performed following the standard protocol without IV contrast. COMPARISON:  None. FINDINGS: Lower chest: Mild bibasilar atelectasis. Moderate coronary artery calcification. Global cardiac size within normal limits. Hepatobiliary: No focal liver abnormality is seen. No gallstones, gallbladder wall thickening, or biliary dilatation. Pancreas: Unremarkable Spleen: Unremarkable Adrenals/Urinary Tract: The adrenal glands are unremarkable. The kidneys are normal in size and position. There is moderate asymmetric right perinephric stranding suggesting an asymmetric inflammatory or obstructive process involving the right kidney. No nephro or urolithiasis. No hydronephrosis. A 2 mm calculus is seen laying dependently within the bladder lumen, possibly representing a recently passed calculus. Stomach/Bowel: Mild sigmoid diverticulosis. The stomach, small bowel, and large bowel are otherwise unremarkable. No evidence of obstruction or focal inflammation. Appendix normal. No free intraperitoneal gas or fluid. Vascular/Lymphatic: Mild aortoiliac atherosclerotic calcification. No aortic aneurysm. No pathologic adenopathy within the abdomen and pelvis. Reproductive: Status post hysterectomy. No adnexal masses. Other: No abdominal wall hernia.  Rectum unremarkable. Musculoskeletal: Degenerative changes are seen within the lumbar spine. No lytic or blastic bone lesions are seen. IMPRESSION: 2 mm calculus laying dependently within the bladder lumen. Moderate asymmetric right perinephric stranding may represent the residua of a previously obstructing, recently passed calculus. No  residual right  hydronephrosis is present, however. Note that an asymmetric inflammatory process, such as pyelonephritis, could appear similarly and correlation with urinalysis and urine culture may be helpful. No additional superimposed nephro or urolithiasis. Aortic Atherosclerosis (ICD10-I70.0). Electronically Signed   By: Fidela Salisbury MD   On: 05/10/2020 12:53    Procedures Procedures   Medications Ordered in ED Medications  acetaminophen (TYLENOL) tablet 650 mg (650 mg Oral Given 05/10/20 0423)  morphine 4 MG/ML injection 4 mg (4 mg Intravenous Given 05/10/20 0838)  ondansetron (ZOFRAN) injection 4 mg (4 mg Intravenous Given 05/10/20 0838)  sodium chloride 0.9 % bolus 500 mL (0 mLs Intravenous Stopped 05/10/20 0935)  morphine 4 MG/ML injection 4 mg (4 mg Intravenous Given 05/10/20 1117)  ketorolac (TORADOL) 30 MG/ML injection 30 mg (30 mg Intravenous Given 05/10/20 1159)  cephALEXin (KEFLEX) capsule 500 mg (500 mg Oral Given 05/10/20 1412)    ED Course  I have reviewed the triage vital signs and the nursing notes.  Pertinent labs & imaging results that were available during my care of the patient were reviewed by me and considered in my medical decision making (see chart for details).  This patient complains of flank pain .  This involves an extensive number of treatment options, and is a complaint that carries with it a high risk of complications and morbidity.  The differential diagnosis includes kidney stone vs UTI vs biliary disease vs colitis vs muscular spasm vs other  No hx of smoking or family hx of AAA - doubt AAA Lower suspicion for mesenteric ischemic based on clinical exam  I ordered, reviewed, and interpreted labs, which included CBC and BMP largely unremarkable, lipase and LFT unremarkable.  I ordered medication IV morphine, IV zofran, IV fluids for pain, nausea and hydration I ordered imaging studies which included CT renal study I independently visualized and interpreted imaging which  showed small 2 mm stone in bladder, signs of residual mild right hydronephrosis, and the monitor tracing which showed sinus rhythm  Clinical Course as of 05/10/20 1644  Thu May 10, 2020  1020 UA in process. [MT]  1148 Toradol, renal stone study ordered.  Trace hgb on UA [MT]  1349 IMPRESSION: 2 mm calculus laying dependently within the bladder lumen. Moderate asymmetric right perinephric stranding may represent the residua of a previously obstructing, recently passed calculus. No residual right hydronephrosis is present, however. Note that an asymmetric inflammatory process, such as pyelonephritis, could appear similarly and correlation with urinalysis and urine culture may be helpful. [MT]  1402 Pain has resolved.  Pt feels back to normal.  Trace leuks and WBC - we'll add on urine cx and treat empirically for infection, although nitrites are negative and this can be reactive.   [MT]    Clinical Course User Index [MT] Aleene Swanner, Carola Rhine, MD    Final Clinical Impression(s) / ED Diagnoses Final diagnoses:  Ureteral colic  Kidney stone    Rx / DC Orders ED Discharge Orders         Ordered    cephALEXin (KEFLEX) 500 MG capsule  2 times daily        05/10/20 1404           Wyvonnia Dusky, MD 05/10/20 1644

## 2020-05-11 LAB — URINE CULTURE

## 2020-05-13 NOTE — Progress Notes (Signed)
Chief Complaint  Patient presents with  . Hospitalization Follow-up    Pt states she is doing better since visit to ER. Wants to discuss Wheezing and blood pressure  . Medication Question    Wants to switch from Advair to [place]    HPI: Amy Stokes 70 y.o. come in for post ed visit for renal stone    See ct scan    Stone in bladder.  And  dyspnea  Iv fluids  And and then passes stone.  Pain stopped.   BP: BP fu has readings   Taking losartan 100  Readings   And  Brought in readings .  137 69 and 139/63 abd after m in am  . eds  126/64 81   Wheezy in am   And not nowor in day  .  No awakening . Then takes adviar  No change wants generic version  Is on cpap  o2 goes to 89 90 at times .  Some exercise intoleracne thinks from not a lot of  Exercise an weight   Takes thyroid  Med first thing  5 530 and then   thinking about [facility].   ... and joined  So far .    Excited about the walking track . Is motivated to get control of weight etc.  ROS: See pertinent positives and negatives per HPI.  Past Medical History:  Diagnosis Date  . Arthritis    no meds  . Asthma   . Edema   . Fibroids   . Hyperlipidemia   . Hypertension   . Obesity   . Sleep apnea    wears cpap   . Thyroid nodule 01/2009   left discovered on chest ct; needle bx "indeterminant" cannon r/o follicular neoplasm     Family History  Problem Relation Age of Onset  . Arthritis Mother   . Other Mother        neurological disorder  . Pancreatic cancer Mother   . Heart disease Father   . Thyroid nodules Sister   . Heart disease Brother        x2 1 brother deceased  . Colon cancer Neg Hx   . Colon polyps Neg Hx   . Esophageal cancer Neg Hx   . Rectal cancer Neg Hx   . Stomach cancer Neg Hx     Social History   Socioeconomic History  . Marital status: Married    Spouse name: Not on file  . Number of children: 0  . Years of education: Not on file  . Highest education level: Not on file   Occupational History  . Occupation: retired  Tobacco Use  . Smoking status: Former Smoker    Packs/day: 1.00    Years: 1.00    Pack years: 1.00    Types: Cigarettes    Quit date: 04/29/1971    Years since quitting: 49.0  . Smokeless tobacco: Never Used  Vaping Use  . Vaping Use: Never used  Substance and Sexual Activity  . Alcohol use: Yes    Alcohol/week: 0.0 standard drinks    Comment: occasionally  . Drug use: No  . Sexual activity: Not on file  Other Topics Concern  . Not on file  Social History Narrative   Married   Occupation: Chiropractor currently unemployed   Married   Regular exercise-no   [place] of 2   Pet Neurosurgeon      Social Determinants of Radio broadcast assistant Strain:  Low Risk   . Difficulty of Paying Living Expenses: Not very hard  Food Insecurity: Not on file  Transportation Needs: No Transportation Needs  . Lack of Transportation (Medical): No  . Lack of Transportation (Non-Medical): No  Physical Activity: Not on file  Stress: Not on file  Social Connections: Not on file    Outpatient Medications Prior to Visit  Medication Sig Dispense Refill  . acetaminophen (TYLENOL) 650 MG CR tablet Take 650 mg by mouth every 8 (eight) hours as needed for pain.    [name] albuterol (PROAIR HFA) 108 (90 Base) MCG/ACT inhaler Inhale 2 puffs into the lungs every 6 (six) hours as needed for wheezing. 1 each 2  . cephALEXin (KEFLEX) 500 MG capsule Take 1 capsule (500 mg total) by mouth 2 (two) times daily for 7 days. 14 capsule 0  . Cholecalciferol 50 MCG (2000 UT) CAPS Take 1 capsule by mouth daily. PATIENT USING OIL AND NOT CAPSULES    . clobetasol ointment (TEMOVATE) 6.22 % Apply 1 application topically as needed.     . Fluticasone-Salmeterol (ADVAIR) 100-50 MCG/DOSE AEPB Inhale 1 puff into the lungs 2 (two) times daily. 1 each 3  . levothyroxine (SYNTHROID) 100 MCG tablet Take 100 mcg by mouth every morning.    . loratadine (CLARITIN) 10 MG tablet Take 10 mg by mouth  daily.    [name] losartan (COZAAR) 100 MG tablet Take 1 tablet (100 mg total) by mouth daily. 90 tablet 3  . MULTIPLE VITAMIN PO Take 1 tablet by mouth daily.    . Multiple Vitamins-Minerals (AIRBORNE) CHEW See admin instructions.    . psyllium (METAMUCIL) 58.6 % powder Take 1 packet by mouth daily.     No facility-administered medications prior to visit.     EXAM:  BP (!) 148/84 (BP Location: Left Arm, Patient Position: Sitting)   Pulse 87   Temp 98.4 F (36.9 C)   Ht 5' 7.99" (1.727 m)   Wt (!) 314 lb (142.4 kg)   SpO2 94%   BMI 47.75 kg/m   Body mass index is 47.75 kg/m.  GENERAL: vitals reviewed and listed above, alert, oriented, appears well hydrated and in no acute distress HEENT: atraumatic, conjunctiva  clear, no obvious abnormalities on inspection of external nose and ears OP : masked  NECK: no obvious masses on inspection palpation  LUNGS: clear to auscultation bilaterally, no wheezes, rales or rhonchi, good air movement CV: HRRR, no clubbing cyanosis chronic le changes  nl cap refill  MS: moves all extremities without noticeable focal  abnormality PSYCH: pleasant and cooperative, no obvious depression or anxiety Lab Results  Component Value Date   WBC 9.8 05/10/2020   HGB 13.6 05/10/2020   HCT 43.1 05/10/2020   PLT 267 05/10/2020   GLUCOSE 129 (H) 05/10/2020   CHOL 195 06/10/2019   TRIG 90.0 06/10/2019   HDL 42.90 06/10/2019   LDLDIRECT 141.7 04/03/2011   LDLCALC 134 (H) 06/10/2019   ALT 25 05/10/2020   AST 21 05/10/2020   NA 138 05/10/2020   K 3.6 05/10/2020   CL 97 (L) 05/10/2020   CREATININE 0.71 05/10/2020   BUN 19 05/10/2020   CO2 30 05/10/2020   TSH 2.21 06/10/2019   INR 0.98 08/02/2013   HGBA1C 6.0 06/10/2019   BP Readings from Last 3 Encounters:  05/14/20 (!) 148/84  05/10/20 (!) 105/53  01/17/20 126/66   Ed reviewed   UA  cx prob not a good CC  ASSESSMENT AND PLAN:  Discussed  the following assessment and plan:  Essential  hypertension  Medication management  Hx of wheezing  OSA on CPAP  Renal stone  MORBID OBESITY Stay hydrated   And consider getting   Fu u/a at some point to make sure clears.  consider adding  Low dose hctz if needed to get to goal  Send in readings   To decide  Monitoring   Potassium  Bmp     If added  a Agree with  lsi plans  Obesity adding to prblems   Check with pulm osa about the wheezing etc . Keep appt next month  And go from t here  -Patient advised to return or notify health care team  if  new concerns arise.  Patient Instructions  If recurrent stones  advise  Urology follow up . Stay  Hydrated in interim .   We can repeat urine  In further if needed.   We can  Add medication  And  Add low dose  hctz in addition   limit  Sodium and increase potassium in diet .  Bp goal  Below 130/80    We add medication  hctz   Continue the  losratan   Send in readings  bp again in a month   We should repeat  Chemistry after BP  controlled and medication set.   Make fu visit in 2-3 month or as needed  Or keep 1 mos  appt on schedule.      [name]. Sharmane Dame M.D.

## 2020-05-14 ENCOUNTER — Other Ambulatory Visit: Payer: Self-pay

## 2020-05-14 ENCOUNTER — Ambulatory Visit (INDEPENDENT_AMBULATORY_CARE_PROVIDER_SITE_OTHER): Payer: Medicare HMO | Admitting: Internal Medicine

## 2020-05-14 ENCOUNTER — Encounter: Payer: Self-pay | Admitting: Internal Medicine

## 2020-05-14 VITALS — BP 148/84 | HR 87 | Temp 98.4°F | Ht 67.99 in | Wt 314.0 lb

## 2020-05-14 DIAGNOSIS — Z9989 Dependence on other enabling machines and devices: Secondary | ICD-10-CM | POA: Diagnosis not present

## 2020-05-14 DIAGNOSIS — I10 Essential (primary) hypertension: Secondary | ICD-10-CM | POA: Diagnosis not present

## 2020-05-14 DIAGNOSIS — Z87898 Personal history of other specified conditions: Secondary | ICD-10-CM

## 2020-05-14 DIAGNOSIS — G4733 Obstructive sleep apnea (adult) (pediatric): Secondary | ICD-10-CM

## 2020-05-14 DIAGNOSIS — N200 Calculus of kidney: Secondary | ICD-10-CM | POA: Diagnosis not present

## 2020-05-14 DIAGNOSIS — Z79899 Other long term (current) drug therapy: Secondary | ICD-10-CM | POA: Diagnosis not present

## 2020-05-14 MED ORDER — HYDROCHLOROTHIAZIDE 12.5 MG PO CAPS: 12.5000 mg | ORAL_CAPSULE | Freq: Every day | ORAL | 1 refills | Status: DC

## 2020-05-14 MED ORDER — FLUTICASONE-SALMETEROL 100-50 MCG/DOSE IN AEPB: 1.0000 | INHALATION_SPRAY | Freq: Two times a day (BID) | RESPIRATORY_TRACT | 5 refills | Status: DC

## 2020-05-14 NOTE — Patient Instructions (Addendum)
If recurrent stones  advise  Urology follow up . Stay  Hydrated in interim .   We can repeat urine  In further if needed.   We can  Add medication  And  Add low dose  hctz in addition   limit  Sodium and increase potassium in diet .  Bp goal  Below 130/80    We add medication  hctz   Continue the  losratan   Send in readings  bp again in a month   We should repeat  Chemistry after BP  controlled and medication set.   Make fu visit in 2-3 month or as needed  Or keep 1 mos  appt on schedule.

## 2020-05-31 DIAGNOSIS — H40013 Open angle with borderline findings, low risk, bilateral: Secondary | ICD-10-CM | POA: Diagnosis not present

## 2020-05-31 DIAGNOSIS — H35033 Hypertensive retinopathy, bilateral: Secondary | ICD-10-CM | POA: Diagnosis not present

## 2020-05-31 DIAGNOSIS — H524 Presbyopia: Secondary | ICD-10-CM | POA: Diagnosis not present

## 2020-05-31 DIAGNOSIS — H2513 Age-related nuclear cataract, bilateral: Secondary | ICD-10-CM | POA: Diagnosis not present

## 2020-05-31 DIAGNOSIS — H353131 Nonexudative age-related macular degeneration, bilateral, early dry stage: Secondary | ICD-10-CM | POA: Diagnosis not present

## 2020-06-11 ENCOUNTER — Other Ambulatory Visit: Payer: Self-pay

## 2020-06-11 ENCOUNTER — Ambulatory Visit (INDEPENDENT_AMBULATORY_CARE_PROVIDER_SITE_OTHER): Payer: Medicare HMO | Admitting: Internal Medicine

## 2020-06-11 ENCOUNTER — Encounter: Payer: Self-pay | Admitting: Internal Medicine

## 2020-06-11 VITALS — BP 142/76 | HR 77 | Temp 98.1°F | Ht 68.0 in | Wt 316.6 lb

## 2020-06-11 DIAGNOSIS — Z79899 Other long term (current) drug therapy: Secondary | ICD-10-CM | POA: Diagnosis not present

## 2020-06-11 DIAGNOSIS — I10 Essential (primary) hypertension: Secondary | ICD-10-CM | POA: Diagnosis not present

## 2020-06-11 DIAGNOSIS — E782 Mixed hyperlipidemia: Secondary | ICD-10-CM | POA: Diagnosis not present

## 2020-06-11 DIAGNOSIS — Z9989 Dependence on other enabling machines and devices: Secondary | ICD-10-CM

## 2020-06-11 DIAGNOSIS — Z0001 Encounter for general adult medical examination with abnormal findings: Secondary | ICD-10-CM | POA: Diagnosis not present

## 2020-06-11 DIAGNOSIS — G4733 Obstructive sleep apnea (adult) (pediatric): Secondary | ICD-10-CM

## 2020-06-11 MED ORDER — HYDROCHLOROTHIAZIDE 12.5 MG PO CAPS: 12.5000 mg | ORAL_CAPSULE | Freq: Every day | ORAL | 1 refills | Status: DC

## 2020-06-11 NOTE — Patient Instructions (Signed)
BP goal 130/80 average   Add the hctz to the losartan 100 mg per day and Continue lifestyle intervention healthy eating and exercise .   Modest weight loss can help BP control .   Plan fasting lab  In 3-4 weeks after  beginning  hctz  Will check lipids and chemistry .

## 2020-06-11 NOTE — Progress Notes (Signed)
Chief Complaint  Patient presents with  . Annual Exam    HPI: Amy Stokes 70 y.o. comes in today for Preventive Medicare exam/ wellness visit .and fu   Since last visit    BP :has readings some in range otherwise high 30s 130s 140 occasional 665 range diastolic in the 99/ 60 range pulses in the 70/80 ranges.  She never took the HCTZ or picked it up.  So she is basically just on the losartan  Getting exercise trying to eat healthier feels that her exercise tolerance is improving.  Respiratory :advair generic  version not covered by insurance  resp  Ok states she gets some wheezing in the morning but feels fine does not have to use her albuterol  Renal stone no sx   Eye doc   Beginning   Macular degeneration   So on airs 2 vitamin.  To be seen in August.   IOP  Borderline.  25   Still in the original Pfizer vaccine trial and follow-up will be looking at fourth booster adapted.  Health Maintenance  Topic Date Due  . PAP SMEAR-Modifier  09/29/2015  . Hepatitis C Screening  06/11/2021 (Originally 04/03/1950)  . MAMMOGRAM  01/04/2021  . TETANUS/TDAP  07/29/2023  . COLONOSCOPY (Pts 45-17yrs Insurance coverage will need to be confirmed)  05/28/2028  . INFLUENZA VACCINE  Completed  . DEXA SCAN  Completed  . COVID-19 Vaccine  Completed  . PNA vac Low Risk Adult  Completed  . HPV VACCINES  Aged Out   Health Maintenance Review LIFESTYLE:  Exercise:  Going  To gym   Joined  [employer].   3 x per week.  Tobacco/ETS: n Alcohol:  ocass beer  Rare  Sugar beverages:   Rare  Sleep: aver 6- 8 hours  Drug use: no HH:  2  No pets    Hearing:  [place]:  No limitations at present . Last eye check UTD  Safety:  Has smoke detector and wears seat belts. . No excess sun exposure. Sees dentist regularly.  Memory: Felt to be good  , no concern from her or her family.  Depression: No anhedonia unusual crying or depressive symptoms  Nutrition: Eats well balanced diet; adequate  calcium and vitamin D. No swallowing chewing problems.  Injury: no major injuries in the last six months.  Other healthcare providers:  Reviewed today   Preventive parameters: up-to-date  Reviewed   ADLS:   There are no problems or need for assistance  driving, feeding, obtaining food, dressing, toileting and bathing, managing money using phone. She is independent.    ROS:  GEN/ HEENT: No fever, significant weight changes sweats headaches vision problems hearing changes, CV/ PULM; No chest pain shortness of breath cough, syncope,edema  change in exercise tolerance. GI /GU: No adominal pain, vomiting, change in bowel habits. No blood in the stool. No significant GU symptoms. SKIN/HEME: ,no acute skin rashes suspicious lesions or bleeding. No lymphadenopathy, nodules, masses.  NEURO/ PSYCH:  No neurologic signs such as weakness numbness. No depression anxiety. IMM/ Allergy: No unusual infections.  Allergy .   REST of 12 system review negative except as per HPI   Past Medical History:  Diagnosis Date  . Arthritis    no meds  . Asthma   . Edema   . Fibroids   . Hyperlipidemia   . Hypertension   . Obesity   . Sleep apnea    wears cpap   . Thyroid  nodule 01/2009   left discovered on chest ct; needle bx "indeterminant" cannon r/o follicular neoplasm     Family History  Problem Relation Age of Onset  . Arthritis Mother   . Other Mother        neurological disorder  . Pancreatic cancer Mother   . Heart disease Father   . Thyroid nodules Sister   . Heart disease Brother        x2 1 brother deceased  . Colon cancer Neg Hx   . Colon polyps Neg Hx   . Esophageal cancer Neg Hx   . Rectal cancer Neg Hx   . Stomach cancer Neg Hx     Social History   Socioeconomic History  . Marital status: Married    Spouse name: Not on file  . Number of children: 0  . Years of education: Not on file  . Highest education level: Not on file  Occupational History  . Occupation: retired   Tobacco Use  . Smoking status: Former Smoker    Packs/day: 1.00    Years: 1.00    Pack years: 1.00    Types: Cigarettes    Quit date: 04/29/1971    Years since quitting: 49.1  . Smokeless tobacco: Never Used  Vaping Use  . Vaping Use: Never used  Substance and Sexual Activity  . Alcohol use: Yes    Alcohol/week: 0.0 standard drinks    Comment: occasionally  . Drug use: No  . Sexual activity: Not on file  Other Topics Concern  . Not on file  Social History Narrative   Married   Occupation: Chiropractor currently unemployed   Married   Regular exercise-no   [place] of 2   Pet cat      Social Determinants of Health   Financial Resource Strain: Low Risk   . Difficulty of Paying Living Expenses: Not very hard  Food Insecurity: Not on file  Transportation Needs: No Transportation Needs  . Lack of Transportation (Medical): No  . Lack of Transportation (Non-Medical): No  Physical Activity: Not on file  Stress: Not on file  Social Connections: Not on file    Outpatient Encounter Medications as of 06/11/2020  Medication Sig  . acetaminophen (TYLENOL) 650 MG CR tablet Take 650 mg by mouth every 8 (eight) hours as needed for pain.  [name] albuterol (PROAIR HFA) 108 (90 Base) MCG/ACT inhaler Inhale 2 puffs into the lungs every 6 (six) hours as needed for wheezing.  . Cholecalciferol 50 MCG (2000 UT) CAPS Take 1 capsule by mouth daily. PATIENT USING OIL AND NOT CAPSULES  . clobetasol ointment (TEMOVATE) 8.25 % Apply 1 application topically as needed.   . Fluticasone-Salmeterol (ADVAIR) 100-50 MCG/DOSE AEPB Inhale 1 puff into the lungs 2 (two) times daily.  . hydrochlorothiazide (MICROZIDE) 12.5 MG capsule Take 1 capsule (12.5 mg total) by mouth daily. For BP control  . levothyroxine (SYNTHROID) 100 MCG tablet Take 100 mcg by mouth every morning.  . loratadine (CLARITIN) 10 MG tablet Take 10 mg by mouth daily.  [name] losartan (COZAAR) 100 MG tablet Take 1 tablet (100 mg total) by mouth  daily.  . MULTIPLE VITAMIN PO Take 1 tablet by mouth daily.  . Multiple Vitamins-Minerals (AIRBORNE) CHEW See admin instructions.  . psyllium (METAMUCIL) 58.6 % powder Take 1 packet by mouth daily.  . [DISCONTINUED] Fluticasone-Salmeterol (WIXELA INHUB) 100-50 MCG/DOSE AEPB Inhale 1 puff into the lungs in the morning and at bedtime.  . [DISCONTINUED] hydrochlorothiazide (MICROZIDE) 12.5 MG  capsule Take 1 capsule (12.5 mg total) by mouth daily.   No facility-administered encounter medications on file as of 06/11/2020.    EXAM:  BP (!) 142/76 (BP Location: Left Arm, Patient Position: Sitting, Cuff Size: Large)   Pulse 77   Temp 98.1 F (36.7 C) (Oral)   Ht 5\' 8"  (1.727 m)   Wt (!) 316 lb 9.6 oz (143.6 kg)   SpO2 94%   BMI 48.14 kg/m   Body mass index is 48.14 kg/m. Wt Readings from Last 3 Encounters:  06/11/20 (!) 316 lb 9.6 oz (143.6 kg)  05/14/20 (!) 314 lb (142.4 kg)  05/10/20 (!) 346 lb 2 oz (157 kg)    Physical Exam: Vital signs reviewed SHF:WYOV is a well-developed well-nourished alert cooperative   who appears stated age in no acute distress.  HEENT: normocephalic atraumatic , Eyes: PERRL EOM's full, conjunctiva clear, Nares: paten,t no deformity discharge or tenderness., Ears: no deformity EAC's clear TMs with normal landmarks. Mouth:NECK: supple without masses, thyromegaly or bruits. CHEST/PULM:  Clear to auscultation and percussion breath sounds equal rare wheeze left resolve with deep inspiration, rales or rhonchi. No chest wall deformities or tenderness.   CV: PMI is nondisplaced, S1 S2 no gallops, murmurs, rubs. Peripheral pulses are full without delay.No JVD .  ABDOMEN: Bowel sounds normal nontender  No guard or rebound, no hepato splenomegal no CVA tenderness.   Extremtities:  No clubbing cyanosis or edema, no acute joint swelling or redness no focal atrophy NEURO:  Oriented x3, cranial nerves 3-12 appear to be intact, no obvious focal weakness,gait within normal  limits no abnormal reflexes or asymmetrical SKIN: No acute rashes normal turgor, color, no bruising or petechiae.  Lower extremity some edema chronic changes feet some scaling but no ulcers few calluses. PSYCH: Oriented, good eye contact, no obvious depression anxiety, cognition and judgment appear normal. LN: no cervical axillary inguinal adenopathy No noted deficits in memory, attention, and speech.   Lab Results  Component Value Date   WBC 9.8 05/10/2020   HGB 13.6 05/10/2020   HCT 43.1 05/10/2020   PLT 267 05/10/2020   GLUCOSE 129 (H) 05/10/2020   CHOL 195 06/10/2019   TRIG 90.0 06/10/2019   HDL 42.90 06/10/2019   LDLDIRECT 141.7 04/03/2011   LDLCALC 134 (H) 06/10/2019   ALT 25 05/10/2020   AST 21 05/10/2020   NA 138 05/10/2020   K 3.6 05/10/2020   CL 97 (L) 05/10/2020   CREATININE 0.71 05/10/2020   BUN 19 05/10/2020   CO2 30 05/10/2020   TSH 2.21 06/10/2019   INR 0.98 08/02/2013   HGBA1C 6.0 06/10/2019    ASSESSMENT AND PLAN:  Discussed the following assessment and plan:  Encounter for preventative adult health care exam with abnormal findings  Essential hypertension - Plan: Lipid panel, Basic metabolic panel  Medication management - Plan: Lipid panel, Basic metabolic panel  Mixed hyperlipidemia - Plan: Lipid panel, Basic metabolic panel  OSA on CPAP  MORBID OBESITY BP not yet at goal  Add the hctz   continue the lifestyle changes that she discussed with me include the gym program and dietary changes. Plan lipid and BMP after beginning medication in 3 to 4 weeks.  Then our OV about 4 months depending She will send in blood pressure readings along the way. Weight loss is helpful  Patient Care Team: Marrie Chandra, [name], MD as PCP - General [name], MD as Consulting Physician (Endocrinology) [name], MD as Consulting Physician (Pulmonary Disease)  [name], MD as Consulting Physician (Obstetrics and Gynecology) [name], [facility] as  Pharmacist (Pharmacist)  Patient Instructions  BP goal 130/80 average   Add the hctz to the losartan 100 mg per day and Continue lifestyle intervention healthy eating and exercise .   Modest weight loss can help BP control .   Plan fasting lab  In 3-4 weeks after  beginning  hctz  Will check lipids and chemistry .      [name]. Jalexus Brett M.D.  After   Visit   Record review   Echo 2019 mild rv dilatation  Consider   Fu   Echo   And chest x ray  If indicated  But  Says doing well

## 2020-06-23 NOTE — Telephone Encounter (Signed)
So I dont think the med caused gums bleeding  Your last  Blood count was normal  If bleeding continues  See you dentist and we can  Also recheck a blood count.  Let  us know if  persistent or progressive   Your BP readings are obviously not decreasing so far.  after 10 + days .   Continue another  7- 10 days and if bp readings  not coming down increase the hctz  to 25 mg per day  ( take 2 of the 12.5)   Send in  FU bp readings

## 2020-07-02 ENCOUNTER — Other Ambulatory Visit (INDEPENDENT_AMBULATORY_CARE_PROVIDER_SITE_OTHER): Payer: Medicare HMO

## 2020-07-02 ENCOUNTER — Other Ambulatory Visit: Payer: Self-pay

## 2020-07-02 DIAGNOSIS — E782 Mixed hyperlipidemia: Secondary | ICD-10-CM

## 2020-07-02 DIAGNOSIS — Z79899 Other long term (current) drug therapy: Secondary | ICD-10-CM

## 2020-07-02 DIAGNOSIS — I10 Essential (primary) hypertension: Secondary | ICD-10-CM

## 2020-07-02 LAB — BASIC METABOLIC PANEL
BUN: 14 mg/dL (ref 6–23)
CO2: 38 mEq/L — ABNORMAL HIGH (ref 19–32)
Calcium: 8.9 mg/dL (ref 8.4–10.5)
Chloride: 96 mEq/L (ref 96–112)
Creatinine, Ser: 0.64 mg/dL (ref 0.40–1.20)
GFR: 90.27 mL/min (ref >60.00)
Glucose, Bld: 97 mg/dL (ref 70–99)
Potassium: 3.2 mEq/L — ABNORMAL LOW (ref 3.5–5.1)
Sodium: 139 mEq/L (ref 135–145)

## 2020-07-02 LAB — LIPID PANEL
Cholesterol: 174 mg/dL (ref 0–200)
HDL: 39.3 mg/dL (ref >39.00)
LDL Cholesterol: 116 mg/dL — ABNORMAL HIGH (ref 0–99)
NonHDL: 134.6
Total CHOL/HDL Ratio: 4
Triglycerides: 95 mg/dL (ref 0.0–149.0)
VLDL: 19 mg/dL (ref 0.0–40.0)

## 2020-07-10 NOTE — Progress Notes (Signed)
So as you can see the potassium level has dropped some on the diuretic.  Blood sugar is good Need to know how your blood pressure is doing may need to add potassium supplement if continuing. We can make a video visit to decide on continued medication supplementation of potassium etc.

## 2020-07-12 ENCOUNTER — Telehealth: Payer: Self-pay | Admitting: Pharmacist

## 2020-07-12 ENCOUNTER — Encounter: Payer: Self-pay | Admitting: Internal Medicine

## 2020-07-12 ENCOUNTER — Telehealth (INDEPENDENT_AMBULATORY_CARE_PROVIDER_SITE_OTHER): Payer: Medicare HMO | Admitting: Internal Medicine

## 2020-07-12 VITALS — BP 145/69 | HR 77

## 2020-07-12 DIAGNOSIS — E876 Hypokalemia: Secondary | ICD-10-CM | POA: Diagnosis not present

## 2020-07-12 DIAGNOSIS — Z79899 Other long term (current) drug therapy: Secondary | ICD-10-CM

## 2020-07-12 DIAGNOSIS — I10 Essential (primary) hypertension: Secondary | ICD-10-CM

## 2020-07-12 MED ORDER — POTASSIUM CHLORIDE CRYS ER 10 MEQ PO TBCR: 20.0000 meq | EXTENDED_RELEASE_TABLET | Freq: Every day | ORAL | 1 refills | Status: DC

## 2020-07-12 NOTE — Telephone Encounter (Signed)
reviewed  Had  VV today

## 2020-07-12 NOTE — Progress Notes (Signed)
Virtual Visit via Video Note  I connected with@ on 07/12/20 at 11:00 AM EDT by a video enabled telemedicine application and verified that I am speaking with the correct person using two identifiers. Location patient: home Location provider:home office Persons participating in the virtual visit: patient, provider  [place]th national recommendations  regarding COVID 19 pandemic   video visit is advised over in office visit for this patient.  Patient aware  of the limitations of evaluation and management by telemedicine and  availability of in person appointments. and agreed to proceed.   HPI: Amy Stokes presents for video visit See readings  Now on losartan 100 and hctz 12.5  No se   bp readings  145/69 77 and 141/66 90   Had second  booster recently  Fatigue but no other se.   ROS: See pertinent positives and negatives per HPI.  Past Medical History:  Diagnosis Date  . Arthritis    no meds  . Asthma   . Edema   . Fibroids   . Hyperlipidemia   . Hypertension   . Obesity   . Sleep apnea    wears cpap   . Thyroid nodule 01/2009   left discovered on chest ct; needle bx "indeterminant" cannon r/o follicular neoplasm     Past Surgical History:  Procedure Laterality Date  . ABDOMINAL HYSTERECTOMY Bilateral 08/02/2013   Procedure: TOTAL ABDOMINAL HYSTERECTOMY WITH BILATERAL SALPINGO-OOPHORECTOMY ;  Surgeon: Terrance Mass, MD;  Location: [facility];  Service: Gynecology;  Laterality: Bilateral;    Dr. Phineas Real to assist.  Dr. Zella Richer to follow with an umbilical hernia repair. He will need an additional 1 1/2 hours.   . COLONOSCOPY  12/27/2007   Sharlett Iles   . HERNIA REPAIR    . MOUTH SURGERY    . thyroid needle bx    . TONSILLECTOMY    . UMBILICAL HERNIA REPAIR N/A 08/02/2013   Procedure: HERNIA REPAIR UMBILICAL ADULT WITH MESH;  Surgeon: Odis Hollingshead, MD;  Location: [facility];  Service: General;  Laterality: N/A;    Family History  Problem Relation Age of Onset  .  Arthritis Mother   . Other Mother        neurological disorder  . Pancreatic cancer Mother   . Heart disease Father   . Thyroid nodules Sister   . Heart disease Brother        x2 1 brother deceased  . Colon cancer Neg Hx   . Colon polyps Neg Hx   . Esophageal cancer Neg Hx   . Rectal cancer Neg Hx   . Stomach cancer Neg Hx     Social History   Tobacco Use  . Smoking status: Former Smoker    Packs/day: 1.00    Years: 1.00    Pack years: 1.00    Types: Cigarettes    Quit date: 04/29/1971    Years since quitting: 49.2  . Smokeless tobacco: Never Used  Vaping Use  . Vaping Use: Never used  Substance Use Topics  . Alcohol use: Yes    Alcohol/week: 0.0 standard drinks    Comment: occasionally  . Drug use: No      Current Outpatient Medications:  .  acetaminophen (TYLENOL) 650 MG CR tablet, Take 650 mg by mouth every 8 (eight) hours as needed for pain., Disp: , Rfl:  .  albuterol (PROAIR HFA) 108 (90 Base) MCG/ACT inhaler, Inhale 2 puffs into the lungs every 6 (six) hours as needed for wheezing., Disp:  1 each, Rfl: 2 .  Cholecalciferol 50 MCG (2000 UT) CAPS, Take 1 capsule by mouth daily. PATIENT USING OIL AND NOT CAPSULES, Disp: , Rfl:  .  clobetasol ointment (TEMOVATE) 7.34 %, Apply 1 application topically as needed. , Disp: , Rfl:  .  Fluticasone-Salmeterol (ADVAIR) 100-50 MCG/DOSE AEPB, Inhale 1 puff into the lungs 2 (two) times daily., Disp: 1 each, Rfl: 3 .  hydrochlorothiazide (MICROZIDE) 12.5 MG capsule, Take 1 capsule (12.5 mg total) by mouth daily. For BP control, Disp: 90 capsule, Rfl: 1 .  levothyroxine (SYNTHROID) 100 MCG tablet, Take 100 mcg by mouth every morning., Disp: , Rfl:  .  loratadine (CLARITIN) 10 MG tablet, Take 10 mg by mouth daily., Disp: , Rfl:  .  losartan (COZAAR) 100 MG tablet, Take 1 tablet (100 mg total) by mouth daily., Disp: 90 tablet, Rfl: 3 .  MULTIPLE VITAMIN PO, Take 1 tablet by mouth daily., Disp: , Rfl:  .  potassium chloride (KLOR-CON)  10 MEQ tablet, Take 2 tablets (20 mEq total) by mouth daily., Disp: 60 tablet, Rfl: 1 .  psyllium (METAMUCIL) 58.6 % powder, Take 1 packet by mouth daily., Disp: , Rfl:  .  Multiple Vitamins-Minerals (AIRBORNE) CHEW, See admin instructions., Disp: , Rfl:   EXAM: BP Readings from Last 3 Encounters:  07/12/20 (!) 145/69  06/11/20 (!) 142/76  05/14/20 (!) 148/84    VITALS per patient if applicable:  GENERAL: alert, oriented, appears well and in no acute distress  HEENT: atraumatic, conjunttiva clear, no obvious abnormalities on inspection of external nose and ears  NECK: normal movements of the head and neck  LUNGS: on inspection no signs of respiratory distress, breathing rate appears normal, no obvious gross SOB, gasping or wheezing  CV: no obvious cyanosis  PSYCH/NEURO: pleasant and cooperative, no obvious depression or anxiety, speech and thought processing grossly intact Lab Results  Component Value Date   WBC 9.8 05/10/2020   HGB 13.6 05/10/2020   HCT 43.1 05/10/2020   PLT 267 05/10/2020   GLUCOSE 97 07/02/2020   CHOL 174 07/02/2020   TRIG 95.0 07/02/2020   HDL 39.30 07/02/2020   LDLDIRECT 141.7 04/03/2011   LDLCALC 116 (H) 07/02/2020   ALT 25 05/10/2020   AST 21 05/10/2020   NA 139 07/02/2020   K 3.2 (L) 07/02/2020   CL 96 07/02/2020   CREATININE 0.64 07/02/2020   BUN 14 07/02/2020   CO2 38 (H) 07/02/2020   TSH 2.21 06/10/2019   INR 0.98 08/02/2013   HGBA1C 6.0 06/10/2019    ASSESSMENT AND PLAN:  Discussed the following assessment and plan:    ICD-10-CM   1. Essential hypertension  L93 Basic metabolic panel  2. Low blood potassium  X90.2 Basic metabolic panel  3. Medication management  Z79.899     Not at goal, no /se noted but lower potassium level  Increase to 2 x 12.5 microzide ( left over)   Add potassium    10 x 2 meq per day  check Bmp in 2-3 weeks   Send in BP  readings then and go from there If ineffective  Needed may change to ccb  Or carvedilol  .to reach goal.   Counseled.   Expectant management and discussion of plan and treatment with opportunity to ask questions and all were answered. The patient agreed with the plan and demonstrated an understanding of the instructions.   Advised to call back or seek an in-person evaluation if worsening  or having  further  concerns . Return for BMP in 2-3 weeks  and bp readings .    Shanon Ace, MD

## 2020-07-12 NOTE — Chronic Care Management (AMB) (Signed)
Note open in Error.

## 2020-07-16 NOTE — Telephone Encounter (Signed)
You can take together at the same time.

## 2020-07-17 ENCOUNTER — Other Ambulatory Visit: Payer: Self-pay | Admitting: Obstetrics and Gynecology

## 2020-07-17 ENCOUNTER — Ambulatory Visit
Admission: RE | Admit: 2020-07-17 | Discharge: 2020-07-17 | Disposition: A | Discharge status: Home / Self Care | Payer: Medicare HMO | Source: Ambulatory Visit | Attending: Obstetrics and Gynecology | Admitting: Obstetrics and Gynecology

## 2020-07-17 ENCOUNTER — Other Ambulatory Visit: Payer: Self-pay

## 2020-07-17 DIAGNOSIS — N6323 Unspecified lump in the left breast, lower outer quadrant: Secondary | ICD-10-CM | POA: Diagnosis not present

## 2020-07-17 DIAGNOSIS — N6320 Unspecified lump in the left breast, unspecified quadrant: Secondary | ICD-10-CM

## 2020-07-17 DIAGNOSIS — Z0100 Encounter for examination of eyes and vision without abnormal findings: Secondary | ICD-10-CM | POA: Diagnosis not present

## 2020-07-21 ENCOUNTER — Other Ambulatory Visit: Payer: Self-pay | Admitting: Internal Medicine

## 2020-07-31 NOTE — Telephone Encounter (Signed)
Thanks for the readings so the last couple readings are much better in the 130 range and make come down further with your lifestyle and medicine I  We can wait another few weeks and then decide whether to switch or rearrange.  If not at goal.  I will be out of the office f May 16 and 23 week but will be back the last week and may and may will be back the last week of May  Me know if you agree with this plan.

## 2020-08-03 ENCOUNTER — Other Ambulatory Visit: Payer: Self-pay | Admitting: Internal Medicine

## 2020-08-06 ENCOUNTER — Telehealth: Payer: Self-pay | Admitting: Pharmacist

## 2020-08-06 NOTE — Chronic Care Management (AMB) (Addendum)
I left the patient a message about her upcoming appointment on 08/07/2020 @ 4:00 pm with the clinical pharmacist. She was asked to please have all medication on hand to review with the pharmacist.   Neita Goodnight) [place], [place] [phone]  Patient called back and rescheduled to 08/07/2020 @ 9am. Patient confirmed the appointment.   [name], PharmD [facility] Clinical Pharmacist [place] [facility] [place]

## 2020-08-06 NOTE — Progress Notes (Signed)
Chronic Care Management Pharmacy Note  08/07/2020 Name:  Amy Stokes MRN:  [id number] DOB:  1950-06-27  Subjective: Amy Stokes is an 70 y.o. year old female who is a primary patient of Panosh, [name], MD.  The CCM team was consulted for assistance with disease management and care coordination needs.    Engaged with patient by telephone for follow up visit in response to provider referral for pharmacy case management and/or care coordination services.   Consent to Services:  The patient was given information about Chronic Care Management services, agreed to services, and gave verbal consent prior to initiation of services.  Please see initial visit note for detailed documentation.   Patient Care Team: Panosh, [name], MD as PCP - General [name], MD as Consulting Physician (Endocrinology) [name], MD as Consulting Physician (Pulmonary Disease) [name], MD as Consulting Physician (Obstetrics and Gynecology) [name], [facility] as Pharmacist (Pharmacist)  Recent office visits: 07/12/20 Shanon Ace, MD: Patient presented for video visit for HTN follow up. Increased HCTZ to 25 mg daily and started potassium 20 mEq/day. Follow up in 2-3 weeks.  06/11/20 Shanon Ace, MD: Patient presented for annual exam. She did not pick up the HCTZ and has been taking losartan only. Recommended starting HCTZ daily.  05/14/20 Shanon Ace, MD: Patient presented for hospitalization follow up.   04/11/20 Janeann Forehand, RN: Patient presented for pneumonia vaccine administration.  Recent consult visits: 02/28/20 [name] (women's health): Patient presented for follow up. Unable to access notes.  Hospital visits: 05/10/20 Patient presented to the ED with flank pain and a kidney stone.  Objective:  Lab Results  Component Value Date   CREATININE 0.64 07/02/2020   BUN 14 07/02/2020   GFR 90.27 07/02/2020   GFRNONAA >60 05/10/2020   GFRAA 95 05/10/2008    NA 139 07/02/2020   K 3.2 (L) 07/02/2020   CALCIUM 8.9 07/02/2020   CO2 38 (H) 07/02/2020   GLUCOSE 97 07/02/2020    Lab Results  Component Value Date/Time   HGBA1C 6.0 06/10/2019 10:16 AM   HGBA1C 6.0 11/04/2017 11:56 AM   GFR 90.27 07/02/2020 08:05 AM   GFR 101.30 06/10/2019 10:16 AM    Last diabetic Eye exam: No results found for: HMDIABEYEEXA  Last diabetic Foot exam: No results found for: HMDIABFOOTEX   Lab Results  Component Value Date   CHOL 174 07/02/2020   HDL 39.30 07/02/2020   LDLCALC 116 (H) 07/02/2020   LDLDIRECT 141.7 04/03/2011   TRIG 95.0 07/02/2020   CHOLHDL 4 07/02/2020    Hepatic Function Latest Ref Rng & Units 05/10/2020 06/10/2019 11/04/2017  Total Protein 6.5 - 8.1 g/dL 7.2 6.8 6.2  Albumin 3.5 - 5.0 g/dL 3.3(L) 3.6 3.5  AST 15 - 41 U/L '21 12 11  ' ALT 0 - 44 U/L '25 15 14  ' Alk Phosphatase 38 - 126 U/L 89 101 87  Total Bilirubin 0.3 - 1.2 mg/dL 0.6 0.5 0.7  Bilirubin, Direct 0.0 - 0.2 mg/dL <0.1 0.1 0.1    Lab Results  Component Value Date/Time   TSH 2.21 06/10/2019 10:16 AM   TSH 1.95 11/04/2017 11:56 AM   FREET4 0.82 04/19/2015 09:47 AM   FREET4 1.0 07/16/2009 03:14 PM    CBC Latest Ref Rng & Units 05/10/2020 06/10/2019 11/04/2017  WBC 4.0 - 10.5 K/uL 9.8 7.6 7.0  Hemoglobin 12.0 - 15.0 g/dL 13.6 13.2 12.2  Hematocrit 36.0 - 46.0 % 43.1 40.3 37.4  Platelets 150 -  400 K/uL 267 257.0 249.0    Lab Results  Component Value Date/Time   VD25OH 38.91 04/19/2015 09:47 AM    Clinical ASCVD: No  The 10-year ASCVD risk score Mikey Bussing DC Jr., et al., 2013) is: 14.8%   Values used to calculate the score:     Age: 39 years     Sex: Female     Is Non-Hispanic African American: No     Diabetic: No     Tobacco smoker: No     Systolic Blood Pressure: 671 mmHg     Is BP treated: Yes     HDL Cholesterol: 39.3 mg/dL     Total Cholesterol: 174 mg/dL    Depression screen [facility] 2/9 05/14/2020 06/10/2019 11/04/2017  Decreased Interest 0 0 0  Down, Depressed,  Hopeless 0 0 0  PHQ - 2 Score 0 0 0      Social History   Tobacco Use  Smoking Status Former Smoker  . Packs/day: 1.00  . Years: 1.00  . Pack years: 1.00  . Types: Cigarettes  . Quit date: 04/29/1971  . Years since quitting: 49.3  Smokeless Tobacco Never Used   BP Readings from Last 3 Encounters:  07/12/20 (!) 145/69  06/11/20 (!) 142/76  05/14/20 (!) 148/84   Pulse Readings from Last 3 Encounters:  07/12/20 77  06/11/20 77  05/14/20 87   Wt Readings from Last 3 Encounters:  06/11/20 (!) 316 lb 9.6 oz (143.6 kg)  05/14/20 (!) 314 lb (142.4 kg)  05/10/20 (!) 346 lb 2 oz (157 kg)   BMI Readings from Last 3 Encounters:  06/11/20 48.14 kg/m  05/14/20 47.75 kg/m  05/10/20 52.63 kg/m    Assessment/Interventions: Review of patient past medical history, allergies, medications, health status, including review of consultants reports, laboratory and other test data, was performed as part of comprehensive evaluation and provision of chronic care management services.   SDOH:  (Social Determinants of Health) assessments and interventions performed: No  SDOH Screenings   Alcohol Screen: Not on file  Depression (PHQ2-9): Low Risk   . PHQ-2 Score: 0  Financial Resource Strain: Low Risk   . Difficulty of Paying Living Expenses: Not very hard  Food Insecurity: Not on file  Housing: Not on file  Physical Activity: Not on file  Social Connections: Not on file  Stress: Not on file  Tobacco Use: Medium Risk  . Smoking Tobacco Use: Former Smoker  . Smokeless Tobacco Use: Never Used  Transportation Needs: No Transportation Needs  . Lack of Transportation (Medical): No  . Lack of Transportation (Non-Medical): No    CCM Care Plan  No Known Allergies  Medications Reviewed Today    Reviewed by Nilda Riggs, CMA (Certified Medical Assistant) on 07/12/20 at 1034  Med List Status: <None>  Medication Order Taking? Sig Documenting Provider Last Dose Status Informant   acetaminophen (TYLENOL) 650 MG CR tablet [id number] Yes Take 650 mg by mouth every 8 (eight) hours as needed for pain. [provider] Taking Active   albuterol (PROAIR HFA) 108 (90 Base) MCG/ACT inhaler [id number] Yes Inhale 2 puffs into the lungs every 6 (six) hours as needed for wheezing. Panosh, [name], MD Taking Active   Cholecalciferol 50 MCG [phone] UT) CAPS [id number] Yes Take 1 capsule by mouth daily. PATIENT USING OIL AND NOT CAPSULES [provider] Taking Active   clobetasol ointment (TEMOVATE) 0.05 % [id number] Yes Apply 1 application topically as needed.  [provider] Taking Active  Fluticasone-Salmeterol (ADVAIR) 100-50 MCG/DOSE AEPB [id number] Yes Inhale 1 puff into the lungs 2 (two) times daily. Panosh, [name], MD Taking Active   hydrochlorothiazide (MICROZIDE) 12.5 MG capsule [id number] Yes Take 1 capsule (12.5 mg total) by mouth daily. For BP control Panosh, [name], MD Taking Active   levothyroxine (SYNTHROID) 100 MCG tablet [id number] Yes Take 100 mcg by mouth every morning. [provider] Taking Active   loratadine (CLARITIN) 10 MG tablet [id number] Yes Take 10 mg by mouth daily. [provider] Taking Active   losartan (COZAAR) 100 MG tablet [id number] Yes Take 1 tablet (100 mg total) by mouth daily. Panosh, [name], MD Taking Active   MULTIPLE VITAMIN PO [id number] Yes Take 1 tablet by mouth daily. [provider] Taking Active   Multiple Vitamins-Minerals ([place]) CHEW [id number]  See admin instructions. [provider]  Active   psyllium (METAMUCIL) 58.6 % powder [id number] Yes Take 1 packet by mouth daily. [provider] Taking Active           Patient Active Problem List   Diagnosis Date Noted  . Cough 08/09/2015  . Iliotibial band syndrome affecting left lower leg 05/26/2014  . Arthritis of left lower extremity 05/26/2014  . Low HDL (under 40) 04/08/2014  . Thyroid nodule 02/12/2014  . OSA  (obstructive sleep apnea) 12/01/2013  . Seroma complicating a procedure 08/18/2013  . Bleeding gums 06/09/2011  . ALLERGY 01/31/2010  . MORBID OBESITY 07/16/2009  . Multinodular goiter (nontoxic) 01/13/2008  . RECTAL BLEEDING 11/22/2007  . UNS ADVRS EFF UNS RX MEDICINAL&BIOLOGICAL SBSTNC 07/26/2007  . HYPERLIPIDEMIA 04/06/2007  . HYPERTENSION 03/05/2007  . TACHYCARDIA 03/05/2007  . Asthma 12/28/2006    Immunization History  Administered Date(s) Administered  . Fluad Quad(high Dose 65+) 12/07/2019  . Influenza Split 11/04/2012, 01/04/2014  . Influenza, High Dose Seasonal PF 11/06/2016, 01/26/2018, 02/16/2019  . Influenza,inj,Quad PF,6+ Mos 04/19/2015, 12/27/2015  . Influenza-Unspecified 11/06/2016, 12/07/2019  . PFIZER Comirnaty(Gray Top)Covid-19 Tri-Sucrose Vaccine 07/10/2020  . PFIZER(Purple Top)SARS-COV-2 Vaccination 11/18/2018, 12/15/2018, 11/03/2019  . Pneumococcal Conjugate-13 04/03/2016  . Pneumococcal Polysaccharide-23 01/31/2010, 04/11/2020  . Tdap 07/28/2013  . Zoster Recombinat (Shingrix) 06/13/2017, 09/27/2017   Patient was using her daily pillbox and forgot to put her losartan in it for a few days. She attributes this to the high blood pressure readings and it has come down some since then.  Patient has been consistently using her CPAP machine but has noticed her O2 sat dropping to 89% during the evening. Recommended making a follow up appt with pulmonology. Patient also checks on her CPAP mask fitting through the tracking website and reports it's always in the high 90s-100s.  Conditions to be addressed/monitored:  Hypertension, Hyperlipidemia, GERD, Asthma, Hypothyroidism, Allergic Rhinitis and Jaw pain  Care Plan : CCM Pharmacy Care Plan  Updates made by [name], [place] since 08/07/2020 12:00 AM    Problem: Problem: Hypertension, Hyperlipidemia, GERD, Asthma, Hypothyroidism, Allergic Rhinitis and Jaw pain     Long-Range Goal: Patient-Specific Goal   Start  Date: 08/07/2020  Expected End Date: 08/07/2021  This Visit's Progress: On track  Priority: High  Note:   Current Barriers:  . Unable to independently monitor therapeutic efficacy . Unable to achieve control of blood pressure   Pharmacist Clinical Goal(s):  [name] Patient will achieve adherence to monitoring guidelines and medication adherence to achieve therapeutic efficacy . achieve control of blood pressure as evidenced by home blood pressure readings  through collaboration with PharmD and provider.  Interventions: . 1:1 collaboration with Panosh, [name], MD regarding development and update of comprehensive plan of care as evidenced by provider attestation and co-signature . Inter-disciplinary care team collaboration (see longitudinal plan of care) . Comprehensive medication review performed; medication list updated in electronic medical record  Hypertension (BP goal <140/90) -Uncontrolled -Current treatment:  Losartan 100 mg 1 tablet daily - in AM  Hydrochlorothiazide 12.5 mg 2 capsules daily - in AM   Potassium daily -Medications previously tried: none -Current home readings: 134/61, 132/75, 132/73 (has not checked in over a week) -Current dietary habits: did not discuss -Current exercise habits: patient joined [employer] and is going 3 days a week (cross trainer x 15 minutes, weight bearing for arms & legs, core exercises) -Denies hypotensive/hypertensive symptoms -Educated on Exercise goal of 150 minutes per week; Importance of home blood pressure monitoring; Proper BP monitoring technique; -Counseled to monitor BP at home twice weekly, document, and provide log at future appointments -Counseled on diet and exercise extensively Recommended to continue current medication Recommended for patient to move losartan to evening time to provide better all day blood pressure coverage  Hyperlipidemia: (LDL goal < 100) -Uncontrolled -Current treatment: . No medications -Medications  previously tried: none (records indicate Lipitor and Mevacor but patient denies ever taking these)   -Current dietary patterns: got an air fryer; a lot less cooking oil; eating fish more often; trying to eat less overall  -Current exercise habits: patient joined [employer] and is going 3 days a week (cross trainer x 15 minutes, weight bearing for arms & legs, core exercises) -Educated on Cholesterol goals;  Importance of limiting foods high in cholesterol; Exercise goal of 150 minutes per week; -Counseled on diet and exercise extensively  Asthma (Goal: control symptoms) -Controlled -Current treatment   Advair 100-50 mcg/dose 1 puff twice daily   Albuterol HFA 1 puff every 6 hours as needed -Medications previously tried: none  -Pulmonary function testing: n/a -Patient reports consistent use of maintenance inhaler -Frequency of rescue inhaler use: a couple of times within the past few weeks -Counseled on Benefits of consistent maintenance inhaler use When to use rescue inhaler -Recommended to continue current medication Requested a refill for Advair inhaler  Hypothyroidism (Goal: TSH 0.35-4.5) -Controlled -Current treatment  . Levothyroxine 100 mcg 1 tablet daily -Medications previously tried: none  -Recommended to continue current medication  GERD (Goal: minimize symptoms) -Controlled -Current treatment  . No medications -Medications previously tried: omeprazole (no longer needed)  -Recommended to continue as is  Allergic rhinitis (Goal: minimize symptoms) -Controlled -Current treatment  . Claritin 10 mg 1 tablet daily -Medications previously tried: none  -Counseled on avoidance of triggers (dust)  Jaw pain (Goal: minimize pain) -Controlled -Current treatment  . Acetaminophen 650 mg 2 tablet as needed (before bed for jaw pain) -Medications previously tried: none  -Recommended to continue current medication Counseled on maximum recommended dose of 3,000 mg/day of  tylenol; discussed that Tylenol is found in many over the counter products (especially cough and cold medicine)    Health Maintenance -Vaccine gaps: none -Current therapy:   Multivitamin 1 tablet daily  Vitamin D 2000 units daily  Optifiber/benefiber daily -Educated on Cost vs benefit of each product must be carefully weighed by individual consumer -Patient is satisfied with current therapy and denies issues -Recommended to continue current medication Counseled on avoidance of supplementation with vitamin C due to recent kidney stone   Patient Goals/Self-Care Activities . Patient will:  - take medications as prescribed check blood  pressure twice weekly, document, and provide at future appointments target a minimum of 150 minutes of moderate intensity exercise weekly  Follow Up Plan: Telephone follow up appointment with care management team member scheduled for: The care management team will reach out to the patient again over the next 21 days.       Medication Assistance: None required.  Patient affirms current coverage meets needs.  Patient's preferred pharmacy is:  [id number] [id number]: [phone]: [phone] Uses pill box? Yes Pt endorses 99% compliance  We discussed: Current pharmacy is preferred with insurance plan and patient is satisfied with pharmacy services Patient decided to: Continue current medication management strategy  Care Plan and Follow Up Patient Decision:  Patient agrees to Care Plan and Follow-up.  Plan: Telephone follow up appointment with care management team member scheduled for:  4 months  MJeni Salles PharmD BHarrisonPharmacist LEustisat [phone]

## 2020-08-07 ENCOUNTER — Telehealth: Payer: Self-pay

## 2020-08-07 ENCOUNTER — Other Ambulatory Visit: Payer: Self-pay

## 2020-08-07 ENCOUNTER — Ambulatory Visit (INDEPENDENT_AMBULATORY_CARE_PROVIDER_SITE_OTHER): Payer: Medicare HMO | Admitting: Pharmacist

## 2020-08-07 DIAGNOSIS — I10 Essential (primary) hypertension: Secondary | ICD-10-CM

## 2020-08-07 DIAGNOSIS — E782 Mixed hyperlipidemia: Secondary | ICD-10-CM | POA: Diagnosis not present

## 2020-08-07 DIAGNOSIS — G4733 Obstructive sleep apnea (adult) (pediatric): Secondary | ICD-10-CM

## 2020-08-07 MED ORDER — FLUTICASONE-SALMETEROL 100-50 MCG/ACT IN AEPB: 1.0000 | INHALATION_SPRAY | Freq: Two times a day (BID) | RESPIRATORY_TRACT | 3 refills | Status: DC

## 2020-08-07 NOTE — Patient Instructions (Addendum)
Hi Amy Stokes,  It was great to get to speak with you again! Below is a summary of some of the topics we discussed.   I sent in a request for the Advair to be refilled but let me know if it doesn't go through within the next few days.  Also as we discussed, go ahead and move the losartan to be taken in the evenings to provide better all day blood pressure coverage. My assistant will reach out to you in a few weeks to check in on your readings.  Please reach out to me if you have any questions or need anything before our follow up!  Best, Maddie  [name], PharmD, [facility]  Visit Information  Goals Addressed   None    Patient Care Plan: CCM Pharmacy Care Plan    Problem Identified: Problem: Hypertension, Hyperlipidemia, GERD, Asthma, Hypothyroidism, Allergic Rhinitis and Jaw pain     Long-Range Goal: Patient-Specific Goal   Start Date: 08/07/2020  Expected End Date: 08/07/2021  This Visit's Progress: On track  Priority: High  Note:   Current Barriers:  . Unable to independently monitor therapeutic efficacy . Unable to achieve control of blood pressure   Pharmacist Clinical Goal(s):  [name] Patient will achieve adherence to monitoring guidelines and medication adherence to achieve therapeutic efficacy . achieve control of blood pressure as evidenced by home blood pressure readings  through collaboration with PharmD and provider.   Interventions: . 1:1 collaboration with Panosh, [name], MD regarding development and update of comprehensive plan of care as evidenced by provider attestation and co-signature . Inter-disciplinary care team collaboration (see longitudinal plan of care) . Comprehensive medication review performed; medication list updated in electronic medical record  Hypertension (BP goal <140/90) -Uncontrolled -Current treatment:  Losartan 100 mg 1 tablet daily - in AM  Hydrochlorothiazide 12.5 mg 2  capsules daily - in AM   Potassium daily -Medications previously tried: none -Current home readings: 134/61, 132/75, 132/73 (has not checked in over a week) -Current dietary habits: did not discuss -Current exercise habits: patient joined [employer] and is going 3 days a week (cross trainer x 15 minutes, weight bearing for arms & legs, core exercises) -Denies hypotensive/hypertensive symptoms -Educated on Exercise goal of 150 minutes per week; Importance of home blood pressure monitoring; Proper BP monitoring technique; -Counseled to monitor BP at home twice weekly, document, and provide log at future appointments -Counseled on diet and exercise extensively Recommended to continue current medication Recommended for patient to move losartan to evening time to provide better all day blood pressure coverage  Hyperlipidemia: (LDL goal < 100) -Uncontrolled -Current treatment: . No medications -Medications previously tried: none (records indicate Lipitor and Mevacor but patient denies ever taking these)   -Current dietary patterns: got an air fryer; a lot less cooking oil; eating fish more often; trying to eat less overall  -Current exercise habits: patient joined [employer] and is going 3 days a week (cross trainer x 15 minutes, weight bearing for arms & legs, core exercises) -Educated on Cholesterol goals;  Importance of limiting foods high in cholesterol; Exercise goal of 150 minutes per week; -Counseled on diet and exercise extensively  Asthma (Goal: control symptoms) -Controlled -Current treatment   Advair 100-50 mcg/dose 1 puff twice daily   Albuterol HFA 1 puff every 6 hours as needed -Medications previously tried: none  -Pulmonary function testing: n/a -Patient reports consistent use of maintenance inhaler -Frequency of rescue inhaler use: a couple  of times within the past few weeks -Counseled on Benefits of consistent maintenance inhaler use When to use rescue  inhaler -Recommended to continue current medication Requested a refill for Advair inhaler  Hypothyroidism (Goal: TSH 0.35-4.5) -Controlled -Current treatment  . Levothyroxine 100 mcg 1 tablet daily -Medications previously tried: none  -Recommended to continue current medication  GERD (Goal: minimize symptoms) -Controlled -Current treatment  . No medications -Medications previously tried: omeprazole (no longer needed)  -Recommended to continue as is  Allergic rhinitis (Goal: minimize symptoms) -Controlled -Current treatment  . Claritin 10 mg 1 tablet daily -Medications previously tried: none  -Counseled on avoidance of triggers (dust)  Jaw pain (Goal: minimize pain) -Controlled -Current treatment  . Acetaminophen 650 mg 2 tablet as needed (before bed for jaw pain) -Medications previously tried: none  -Recommended to continue current medication Counseled on maximum recommended dose of 3,000 mg/day of tylenol; discussed that Tylenol is found in many over the counter products (especially cough and cold medicine)    Health Maintenance -Vaccine gaps: none -Current therapy:   Multivitamin 1 tablet daily  Vitamin D 2000 units daily  Optifiber/benefiber daily -Educated on Cost vs benefit of each product must be carefully weighed by individual consumer -Patient is satisfied with current therapy and denies issues -Recommended to continue current medication Counseled on avoidance of supplementation with vitamin C due to recent kidney stone   Patient Goals/Self-Care Activities . Patient will:  - take medications as prescribed check blood pressure twice weekly, document, and provide at future appointments target a minimum of 150 minutes of moderate intensity exercise weekly  Follow Up Plan: Telephone follow up appointment with care management team member scheduled for: The care management team will reach out to the patient again over the next 21 days.        Patient  verbalizes understanding of instructions provided today and agrees to view in [place].  The pharmacy team will reach out to the patient again over the next 21 days.   [name], RPH  How to Take Your Blood Pressure Blood pressure measures how strongly your blood is pressing against the walls of your arteries. Arteries are blood vessels that carry blood from your heart throughout your body. You can take your blood pressure at home with a machine. You may need to check your blood pressure at home:  To check if you have high blood pressure (hypertension).  To check your blood pressure over time.  To make sure your blood pressure medicine is working. Supplies needed:  Blood pressure machine, or monitor.  Dining room chair to sit in.  Table or desk.  Small notebook.  Pencil or pen. How to prepare Avoid these things for 30 minutes before checking your blood pressure:  Having drinks with caffeine in them, such as coffee or tea.  Drinking alcohol.  Eating.  Smoking.  Exercising. Do these things five minutes before checking your blood pressure:  Go to the bathroom and pee (urinate).  Sit in a dining chair. Do not sit in a soft couch or an armchair.  Be quiet. Do not talk. How to take your blood pressure Follow the instructions that came with your machine. If you have a digital blood pressure monitor, these may be the instructions: 1. Sit up straight. 2. Place your feet on the floor. Do not cross your ankles or legs. 3. Rest your left arm at the level of your heart. You may rest it on a table, desk, or chair. 4. Pull  up your shirt sleeve. 5. Wrap the blood pressure cuff around the upper part of your left arm. The cuff should be 1 inch (2.5 cm) above your elbow. It is best to wrap the cuff around bare skin. 6. Fit the cuff snugly around your arm. You should be able to place only one finger between the cuff and your arm. 7. Place the cord so that it rests in the bend of  your elbow. 8. Press the power button. 9. Sit quietly while the cuff fills with air and loses air. 10. Write down the numbers on the screen. 11. Wait 2-3 minutes and then repeat steps 1-10.   What do the numbers mean? Two numbers make up your blood pressure. The first number is called systolic pressure. The second is called diastolic pressure. An example of a blood pressure reading is "120 over 80" (or 120/80). If you are an adult and do not have a medical condition, use this guide to find out if your blood pressure is normal: Normal  First number: below 120.  Second number: below 80. Elevated  First number: 120-129.  Second number: below 80. Hypertension stage 1  First number: 130-139.  Second number: 80-89. Hypertension stage 2  First number: 140 or above.  Second number: 14 or above. Your blood pressure is above normal even if only the top or bottom number is above normal. Follow these instructions at home:  Check your blood pressure as often as your doctor tells you to.  Check your blood pressure at the same time every day.  Take your monitor to your next doctor's appointment. Your doctor will: ? Make sure you are using it correctly. ? Make sure it is working right.  Make sure you understand what your blood pressure numbers should be.  Tell your doctor if your medicine is causing side effects.  Keep all follow-up visits as told by your doctor. This is important. General tips:  You will need a blood pressure machine, or monitor. Your doctor can suggest a monitor. You can buy one at a drugstore or online. When choosing one: ? Choose one with an arm cuff. ? Choose one that wraps around your upper arm. Only one finger should fit between your arm and the cuff. ? Do not choose one that measures your blood pressure from your wrist or finger. Where to find more information American Heart Association: www.heart.org Contact a doctor if:  Your blood pressure keeps being  high. Get help right away if:  Your first blood pressure number is higher than 180.  Your second blood pressure number is higher than 120. Summary  Check your blood pressure at the same time every day.  Avoid caffeine, alcohol, smoking, and exercise for 30 minutes before checking your blood pressure.  Make sure you understand what your blood pressure numbers should be. This information is not intended to replace advice given to you by your health care provider. Make sure you discuss any questions you have with your health care provider. Document Revised: 03/11/2019 Document Reviewed: 03/11/2019 Elsevier Patient Education  2021 [employer].

## 2020-08-07 NOTE — Telephone Encounter (Signed)
-----   Message from [name], [facility] sent at 08/07/2020  9:45 AM EDT ----- Regarding: Advair refill Hi,  Ms. Kujala just requested a refill for her Advair to be sent to [facility] please!  Thank you! Maddie

## 2020-08-07 NOTE — Telephone Encounter (Signed)
-----   Message from Madeline G Pryor, RPH sent at 08/07/2020  9:45 AM EDT ----- Regarding: Advair refill Hi,  Ms. Rolfson just requested a refill for her Advair to be sent to [facility] please!  Thank you! Maddie  

## 2020-08-07 NOTE — Telephone Encounter (Signed)
Rx for Advair has been sent.

## 2020-08-13 ENCOUNTER — Telehealth: Payer: Self-pay | Admitting: Pharmacist

## 2020-08-13 MED ORDER — HYDROCHLOROTHIAZIDE 25 MG PO TABS: 25.0000 mg | ORAL_TABLET | Freq: Every day | ORAL | 0 refills | Status: DC

## 2020-08-13 MED ORDER — HYDROCHLOROTHIAZIDE 12.5 MG PO CAPS
12.5000 mg | ORAL_CAPSULE | Freq: Two times a day (BID) | ORAL | 1 refills | Status: DC
Start: 2020-08-13 — End: 2020-08-13

## 2020-08-13 NOTE — Telephone Encounter (Signed)
I sent in for #90 of the 25 mg HCTZ daily

## 2020-08-13 NOTE — Telephone Encounter (Signed)
Patient called as she is out of her HCTZ due to doubling up on the dose as recommended with last visit with PCP.  It is a refill too soon with her insurance and her pharmacy is trying to charge her for the cash price. She would like a new rx to be sent in.

## 2020-08-22 ENCOUNTER — Other Ambulatory Visit: Payer: Self-pay

## 2020-08-22 ENCOUNTER — Encounter: Payer: Self-pay | Admitting: Pulmonary Disease

## 2020-08-22 ENCOUNTER — Ambulatory Visit: Payer: Medicare HMO | Admitting: Pulmonary Disease

## 2020-08-22 VITALS — BP 124/80 | HR 85 | Temp 97.3°F | Ht 69.0 in | Wt 309.6 lb

## 2020-08-22 DIAGNOSIS — Z9989 Dependence on other enabling machines and devices: Secondary | ICD-10-CM

## 2020-08-22 DIAGNOSIS — G4733 Obstructive sleep apnea (adult) (pediatric): Secondary | ICD-10-CM | POA: Diagnosis not present

## 2020-08-22 NOTE — Progress Notes (Signed)
Amy Stokes    [id number]    [birth year]  Primary Care Physician:Panosh, [name], MD  Referring Physician: Burnis Medin, [place],  [place] [id number]  Chief complaint:   Known obstructive sleep apnea Stable symptoms Occasional wheezing  HPI:  Tolerating CPAP well with no significant problems  Occasional wheezing  Rarely uses albuterol Uses Advair 100 twice a day  She wakes up from sleep feeling like she is under decent nights rest Very compliant with CPAP use  Memory is fine No dryness of her mouth Wakes up in the morning feeling like she is at a good nights rest on most days  Weight has been stable  Denies any significant sleep quality issues at present  Reformed smoker quit in 1973  She does use inhaler, rarely uses albuterol  Outpatient Encounter Medications as of 08/22/2020  Medication Sig  . acetaminophen (TYLENOL) 650 MG CR tablet Take 650 mg by mouth every 8 (eight) hours as needed for pain.  [name] albuterol (PROAIR HFA) 108 (90 Base) MCG/ACT inhaler Inhale 2 puffs into the lungs every 6 (six) hours as needed for wheezing.  . Cholecalciferol 50 MCG (2000 UT) CAPS Take 1 capsule by mouth daily. PATIENT USING OIL AND NOT CAPSULES  . clobetasol ointment (TEMOVATE) 6.06 % Apply 1 application topically as needed.   . fluticasone-salmeterol (ADVAIR) 100-50 MCG/ACT AEPB Inhale 1 puff into the lungs 2 (two) times daily.  . hydrochlorothiazide (HYDRODIURIL) 25 MG tablet Take 1 tablet (25 mg total) by mouth daily.  [name] KLOR-CON M10 10 MEQ tablet TAKE 2 TABLETS BY MOUTH DAILY  . levothyroxine (SYNTHROID) 100 MCG tablet Take 100 mcg by mouth every morning.  . loratadine (CLARITIN) 10 MG tablet Take 10 mg by mouth daily.  [name] losartan (COZAAR) 100 MG tablet TAKE 1 TABLET BY MOUTH EVERY DAY  . Lutein 10 MG TABS Take by mouth.  . MULTIPLE VITAMIN PO Take 1 tablet by mouth daily.  . psyllium (METAMUCIL) 58.6 % powder Take 1 packet by mouth  daily.  . [DISCONTINUED] Fluticasone-Salmeterol (ADVAIR) 100-50 MCG/DOSE AEPB Inhale 1 puff into the lungs 2 (two) times daily.   No facility-administered encounter medications on file as of 08/22/2020.    Allergies as of 08/22/2020  . (No Known Allergies)    Past Medical History:  Diagnosis Date  . Arthritis    no meds  . Asthma   . Edema   . Fibroids   . Hyperlipidemia   . Hypertension   . Obesity   . Sleep apnea    wears cpap   . Thyroid nodule 01/2009   left discovered on chest ct; needle bx "indeterminant" cannon r/o follicular neoplasm     Past Surgical History:  Procedure Laterality Date  . ABDOMINAL HYSTERECTOMY Bilateral 08/02/2013   Procedure: TOTAL ABDOMINAL HYSTERECTOMY WITH BILATERAL SALPINGO-OOPHORECTOMY ;  Surgeon: Terrance Mass, MD;  Location: [facility];  Service: Gynecology;  Laterality: Bilateral;    Dr. Phineas Real to assist.  Dr. Zella Richer to follow with an umbilical hernia repair. He will need an additional 1 1/2 hours.   . COLONOSCOPY  12/27/2007   Sharlett Iles   . HERNIA REPAIR    . MOUTH SURGERY    . thyroid needle bx    . TONSILLECTOMY    . UMBILICAL HERNIA REPAIR N/A 08/02/2013   Procedure: HERNIA REPAIR UMBILICAL ADULT WITH MESH;  Surgeon: Odis Hollingshead, MD;  Location: [facility];  Service:  General;  Laterality: N/A;    Family History  Problem Relation Age of Onset  . Arthritis Mother   . Other Mother        neurological disorder  . Pancreatic cancer Mother   . Heart disease Father   . Thyroid nodules Sister   . Heart disease Brother        x2 1 brother deceased  . Colon cancer Neg Hx   . Colon polyps Neg Hx   . Esophageal cancer Neg Hx   . Rectal cancer Neg Hx   . Stomach cancer Neg Hx     Social History   Socioeconomic History  . Marital status: Married    Spouse name: Not on file  . Number of children: 0  . Years of education: Not on file  . Highest education level: Not on file  Occupational History  . Occupation: retired   Tobacco Use  . Smoking status: Former Smoker    Packs/day: 1.00    Years: 1.00    Pack years: 1.00    Types: Cigarettes    Quit date: 04/29/1971    Years since quitting: 49.3  . Smokeless tobacco: Never Used  Vaping Use  . Vaping Use: Never used  Substance and Sexual Activity  . Alcohol use: Yes    Alcohol/week: 0.0 standard drinks    Comment: occasionally  . Drug use: No  . Sexual activity: Not on file  Other Topics Concern  . Not on file  Social History Narrative   Married   Occupation: Chiropractor currently unemployed   Married   Regular exercise-no   [place] of 2   Pet cat      Social Determinants of Health   Financial Resource Strain: Low Risk   . Difficulty of Paying Living Expenses: Not very hard  Food Insecurity: Not on file  Transportation Needs: No Transportation Needs  . Lack of Transportation (Medical): No  . Lack of Transportation (Non-Medical): No  Physical Activity: Not on file  Stress: Not on file  Social Connections: Not on file  Intimate Partner Violence: Not on file    Review of Systems  Constitutional: Negative for fatigue.  HENT: Negative.   Eyes: Negative.   Respiratory: Negative.  Negative for shortness of breath.   Cardiovascular: Negative.  Negative for leg swelling.  Gastrointestinal: Negative.   Endocrine: Negative.   Genitourinary: Negative.     Vitals:   08/22/20 1407  BP: 124/80  Pulse: 85  Temp: (!) 97.3 F (36.3 C)  SpO2: 96%     Physical Exam Constitutional:      Appearance: She is well-developed.  HENT:     Head: Normocephalic and atraumatic.     Mouth/Throat:     Mouth: Mucous membranes are moist.  Eyes:     General:        Right eye: No discharge.        Left eye: No discharge.     Conjunctiva/sclera: Conjunctivae normal.  Neck:     Thyroid: No thyromegaly.     Trachea: No tracheal deviation.  Cardiovascular:     Rate and Rhythm: Normal rate and regular rhythm.  Pulmonary:     Effort: Pulmonary effort  is normal. No respiratory distress.     Breath sounds: Normal breath sounds. No stridor. No wheezing or rhonchi.  Musculoskeletal:     Cervical back: Tenderness present. No rigidity.  Neurological:     Mental Status: She is alert.  Psychiatric:  Mood and Affect: Mood normal.    Results of the Epworth flowsheet 05/03/2018  Sitting and reading 1  Watching TV 1  Sitting, inactive in a public place (e.g. a theatre or a meeting) 0  As a passenger in a car for an hour without a break 0  Lying down to rest in the afternoon when circumstances permit 0  Sitting and talking to someone 0  Sitting quietly after a lunch without alcohol 0  In a car, while stopped for a few minutes in traffic 0  Total score 2    Data Reviewed: Compliance data shows 99% compliance Average use of 8 hours 6 minutes Set between 4 and 20 Median pressure of 6.4 with 95 percentile pressure of 9.8  Residual AHI of 2.2  Assessment:  Obstructive sleep apnea -Stable symptoms  Obesity  History of asthma -Currently on Advair 100 -With wheezing, wanted to increase to Advair 250 but she wants to complete the refills she is got on hand at the present time prior to increasing the dose  Plan/Recommendations: Continue CPAP therapy Follow-up in a year  Encouraged to call with any significant concerns  Sherrilyn Rist MD  Pulmonary and Critical Care 08/22/2020, 2:19 PM  CC: Panosh, [name], MD

## 2020-08-22 NOTE — Patient Instructions (Signed)
Continue using CPAP on a regular basis Your download from the machine shows that it is working well  Call us whenever you are done with the current Advair refill you have if you are still having wheezing  I will see you back in a year

## 2020-08-28 ENCOUNTER — Ambulatory Visit (INDEPENDENT_AMBULATORY_CARE_PROVIDER_SITE_OTHER): Payer: Medicare HMO

## 2020-08-28 ENCOUNTER — Other Ambulatory Visit: Payer: Self-pay

## 2020-08-28 VITALS — BP 132/72 | HR 79 | Temp 98.3°F | Wt 310.2 lb

## 2020-08-28 DIAGNOSIS — Z0000 Encounter for general adult medical examination without abnormal findings: Secondary | ICD-10-CM

## 2020-08-28 NOTE — Progress Notes (Signed)
Subjective:   Amy Stokes is a 70 y.o. female who presents for an Initial Medicare Annual Wellness Visit.  Review of Systems     Cardiac Risk Factors include: advanced age (>4men, >20 women);hypertension;obesity (BMI >30kg/m2);dyslipidemia     Objective:    Today's Vitals   08/28/20 0811  BP: 132/72  Pulse: 79  Temp: 98.3 F (36.8 C)  SpO2: 95%  Weight: (!) 310 lb 3.2 oz (140.7 kg)   Body mass index is 45.81 kg/m.  Advanced Directives 08/28/2020 07/08/2019 05/17/2018 08/02/2013 08/02/2013 07/29/2013  Does Patient Have a Medical Advance Directive? No No Yes Patient has advance directive, copy in chart Patient has advance directive, copy in chart Patient does not have advance directive;Patient would not like information  Type of Advance Directive - - Living will Healthcare Power of [place];Living will - -  Does patient want to make changes to medical advance directive? - - - No change requested - -  Would patient like information on creating a medical advance directive? Yes (MAU/Ambulatory/Procedural Areas - Information given) No - Patient declined - - - -  Pre-existing out of facility DNR order (yellow form or pink MOST form) - - - No - -    Current Medications (verified) Outpatient Encounter Medications as of 08/28/2020  Medication Sig  . acetaminophen (TYLENOL) 650 MG CR tablet Take 650 mg by mouth every 8 (eight) hours as needed for pain.  [name] albuterol (PROAIR HFA) 108 (90 Base) MCG/ACT inhaler Inhale 2 puffs into the lungs every 6 (six) hours as needed for wheezing.  . Cholecalciferol 50 MCG (2000 UT) CAPS Take 1 capsule by mouth daily. PATIENT USING OIL AND NOT CAPSULES  . clobetasol ointment (TEMOVATE) 2.69 % Apply 1 application topically as needed.   . fluticasone-salmeterol (ADVAIR) 100-50 MCG/ACT AEPB Inhale 1 puff into the lungs 2 (two) times daily.  . hydrochlorothiazide (HYDRODIURIL) 25 MG tablet Take 1 tablet (25 mg total) by mouth daily.  [name] KLOR-CON M10 10 MEQ  tablet TAKE 2 TABLETS BY MOUTH DAILY  . levothyroxine (SYNTHROID) 100 MCG tablet Take 100 mcg by mouth every morning.  . loratadine (CLARITIN) 10 MG tablet Take 10 mg by mouth daily.  [name] losartan (COZAAR) 100 MG tablet TAKE 1 TABLET BY MOUTH EVERY DAY  . Lutein 10 MG TABS Take by mouth.  . MULTIPLE VITAMIN PO Take 1 tablet by mouth daily.  . Wheat Dextrin (BENEFIBER DRINK MIX PO) Take by mouth.  . psyllium (METAMUCIL) 58.6 % powder Take 1 packet by mouth daily. (Patient not taking: Reported on 08/28/2020)   No facility-administered encounter medications on file as of 08/28/2020.    Allergies (verified) Patient has no known allergies.   History: Past Medical History:  Diagnosis Date  . Arthritis    no meds  . Asthma   . Edema   . Fibroids   . Hyperlipidemia   . Hypertension   . Obesity   . Sleep apnea    wears cpap   . Thyroid nodule 01/2009   left discovered on chest ct; needle bx "indeterminant" cannon r/o follicular neoplasm    Past Surgical History:  Procedure Laterality Date  . ABDOMINAL HYSTERECTOMY Bilateral 08/02/2013   Procedure: TOTAL ABDOMINAL HYSTERECTOMY WITH BILATERAL SALPINGO-OOPHORECTOMY ;  Surgeon: Terrance Mass, MD;  Location: [facility];  Service: Gynecology;  Laterality: Bilateral;    Dr. Phineas Real to assist.  Dr. Zella Richer to follow with an umbilical hernia repair. He will need an additional 1 1/2 hours.   [name]  COLONOSCOPY  12/27/2007   Sharlett Iles   . HERNIA REPAIR    . MOUTH SURGERY    . thyroid needle bx    . TONSILLECTOMY    . UMBILICAL HERNIA REPAIR N/A 08/02/2013   Procedure: HERNIA REPAIR UMBILICAL ADULT WITH MESH;  Surgeon: Odis Hollingshead, MD;  Location: [facility];  Service: General;  Laterality: N/A;   Family History  Problem Relation Age of Onset  . Arthritis Mother   . Other Mother        neurological disorder  . Pancreatic cancer Mother   . Heart disease Father   . Thyroid nodules Sister   . Heart disease Brother        x2 1 brother deceased   . Colon cancer Neg Hx   . Colon polyps Neg Hx   . Esophageal cancer Neg Hx   . Rectal cancer Neg Hx   . Stomach cancer Neg Hx    Social History   Socioeconomic History  . Marital status: Married    Spouse name: Not on file  . Number of children: 0  . Years of education: Not on file  . Highest education level: Not on file  Occupational History  . Occupation: retired  Tobacco Use  . Smoking status: Former Smoker    Packs/day: 1.00    Years: 1.00    Pack years: 1.00    Types: Cigarettes    Quit date: 04/29/1971    Years since quitting: 49.3  . Smokeless tobacco: Never Used  Vaping Use  . Vaping Use: Never used  Substance and Sexual Activity  . Alcohol use: Yes    Alcohol/week: 0.0 standard drinks    Comment: occasionally  . Drug use: No  . Sexual activity: Not on file  Other Topics Concern  . Not on file  Social History Narrative   Married   Occupation: Chiropractor currently unemployed   Married   Regular exercise-no   [place]   Pet cat      Social Determinants of Health   Financial Resource Strain: Low Risk   . Difficulty of Paying Living Expenses: Not hard at all  Food Insecurity: No Food Insecurity  . Worried About Charity fundraiser in the Last Year: Never true  . Ran Out of Food in the Last Year: Never true  Transportation Needs: No Transportation Needs  . Lack of Transportation (Medical): No  . Lack of Transportation (Non-Medical): No  Physical Activity: Sufficiently Active  . Days of Exercise per Week: 3 days  . Minutes of Exercise per Session: 90 min  Stress: No Stress Concern Present  . Feeling of Stress : Not at all  Social Connections: Moderately Integrated  . Frequency of Communication with Friends and Family: More than three times a week  . Frequency of Social Gatherings with Friends and Family: Three times a week  . Attends Religious Services: Never  . Active Member of Clubs or Organizations: Yes  . Attends Archivist  Meetings: 1 to 4 times per year  . Marital Status: Married    Tobacco Counseling Counseling given: Not Answered   Clinical Intake:  Pre-visit preparation completed: Yes  Pain : No/denies pain     BMI - recorded: 45.81 Nutritional Status: BMI > 30  Obese Nutritional Risks: None Diabetes: No  How often do you need to have someone help you when you read instructions, pamphlets, or other written materials from your doctor or pharmacy?: 1 - Never  Diabetic?No  Interpreter Needed?: No  Information entered by :: Charlott Rakes, LPN   Activities of Daily Living In your present state of health, do you have any difficulty performing the following activities: 08/28/2020  Hearing? N  Vision? N  Difficulty concentrating or making decisions? N  Walking or climbing stairs? N  Dressing or bathing? N  Doing errands, shopping? N  Preparing Food and eating ? N  Using the Toilet? N  In the past six months, have you accidently leaked urine? Y  Comment wears a pad at times  Do you have problems with loss of bowel control? N  Managing your Medications? N  Managing your Finances? N  Housekeeping or managing your Housekeeping? N  Some recent data might be hidden    Patient Care Team: Panosh, [name], MD as PCP - General [name], MD as Consulting Physician (Endocrinology) [name], MD as Consulting Physician (Pulmonary Disease) [name], MD as Consulting Physician (Obstetrics and Gynecology) [name], [facility] as Pharmacist (Pharmacist)  Indicate any recent Medical Services you may have received from other than [facility] providers in the past year (date may be approximate).     Assessment:   This is a routine wellness examination for Amy Stokes.  Hearing/Vision screen  Hearing Screening   125Hz  250Hz  500Hz  1000Hz  2000Hz  3000Hz  4000Hz  6000Hz  8000Hz   Right ear:           Left ear:           Comments: Pt stated in a crowded room it may be difficult   Vision  Screening Comments: Pt follows up annually with Dr Herbert Deaner  Dietary issues and exercise activities discussed: Current Exercise Habits: Home exercise routine;Structured exercise class, Type of exercise: strength training/weights;walking, Time (Minutes): > 60, Frequency (Times/Week): 3, Weekly Exercise (Minutes/Week): 0  Goals Addressed            This Visit's Progress   . Patient Stated       More physically fit       Depression Screen PHQ 2/9 Scores 08/28/2020 05/14/2020 06/10/2019 11/04/2017 10/17/2016 06/16/2016  PHQ - 2 Score 0 0 0 0 0 0    Fall Risk Fall Risk  08/28/2020 06/11/2020 05/14/2020 06/10/2019 11/04/2017  Falls in the past year? 0 0 0 0 No  Number falls in past yr: 0 0 0 0 -  Injury with Fall? 0 0 0 0 -  Risk for fall due to : Impaired vision - - - -  Follow up Falls prevention discussed - Falls evaluation completed Falls evaluation completed -    FALL RISK PREVENTION PERTAINING TO THE HOME:  Any stairs in or around the home? Yes  If so, are there any without handrails? No  Home free of loose throw rugs in walkways, pet beds, electrical cords, etc? Yes  Adequate lighting in your home to reduce risk of falls? Yes   ASSISTIVE DEVICES UTILIZED TO PREVENT FALLS:  Life alert? No  Use of a cane, walker or w/c? No  Grab bars in the bathroom? Yes Shower chair or bench in shower? No Elevated toilet seat or a handicapped toilet? No   TIMED UP AND GO:  Was the test performed? Yes .  Length of time to ambulate 10 feet: 10 sec.   Gait steady and fast without use of assistive device  Cognitive Function:     6CIT Screen 08/28/2020  What Year? 0 points  What month? 0 points  What time? 0 points  Count  back from 20 0 points  Months in reverse 0 points  Repeat phrase 2 points  Total Score 2    Immunizations Immunization History  Administered Date(s) Administered  . Fluad Quad(high Dose 65+) 12/07/2019  . Influenza Split 11/04/2012, 01/04/2014  . Influenza, High Dose  Seasonal PF 11/06/2016, 01/26/2018, 02/16/2019  . Influenza,inj,Quad PF,6+ Mos 04/19/2015, 12/27/2015  . Influenza-Unspecified 11/06/2016, 12/07/2019  . PFIZER Comirnaty(Gray Top)Covid-19 Tri-Sucrose Vaccine 07/10/2020  . PFIZER(Purple Top)SARS-COV-2 Vaccination 11/18/2018, 12/15/2018, 11/03/2019  . Pneumococcal Conjugate-13 04/03/2016  . Pneumococcal Polysaccharide-23 01/31/2010, 04/11/2020  . Tdap 07/28/2013  . Zoster Recombinat (Shingrix) 06/13/2017, 09/27/2017    TDAP status: Up to date  Flu Vaccine status: Up to date  Pneumococcal vaccine status: Up to date  Covid-19 vaccine status: Completed vaccines  Qualifies for Shingles Vaccine? Yes   Zostavax completed Yes   Shingrix Completed?: Yes  Screening Tests Health Maintenance  Topic Date Due  . PAP SMEAR-Modifier  09/29/2015  . Hepatitis C Screening  06/11/2021 (Originally 03/30/1969)  . INFLUENZA VACCINE  10/29/2020  . MAMMOGRAM  01/04/2021  . TETANUS/TDAP  07/29/2023  . COLONOSCOPY (Pts 45-83yrs Insurance coverage will need to be confirmed)  05/28/2028  . DEXA SCAN  Completed  . COVID-19 Vaccine  Completed  . PNA vac Low Risk Adult  Completed  . Zoster Vaccines- Shingrix  Completed  . HPV VACCINES  Aged Out    Health Maintenance  Health Maintenance Due  Topic Date Due  . PAP SMEAR-Modifier  09/29/2015    Colorectal cancer screening: Type of screening: Colonoscopy. Completed 05/28/18. Repeat every 10 years  Mammogram status: Completed 01/16/20. Repeat every year  Bone Density status: Completed 10/19/14. Results reflect: Bone density results: NORMAL. Repeat every 3-5 years.   Additional Screening:  Hepatitis C Screening: does qualify;  Vision Screening: Recommended annual ophthalmology exams for early detection of glaucoma and other disorders of the eye. Is the patient up to date with their annual eye exam?  Yes  Who is the provider or what is the name of the office in which the patient attends annual eye  exams? Dr Herbert Deaner  If pt is not established with a provider, would they like to be referred to a provider to establish care? No .   Dental Screening: Recommended annual dental exams for proper oral hygiene  Community Resource Referral / Chronic Care Management: CRR required this visit?  No   CCM required this visit?  No      Plan:     I have personally reviewed and noted the following in the patient's chart:   . Medical and social history . Use of alcohol, tobacco or illicit drugs  . Current medications and supplements including opioid prescriptions. Patient is not currently taking opioid prescriptions. . Functional ability and status . Nutritional status . Physical activity . Advanced directives . List of other physicians . Hospitalizations, surgeries, and ER visits in previous 12 months . Vitals . Screenings to include cognitive, depression, and falls . Referrals and appointments  In addition, I have reviewed and discussed with patient certain preventive protocols, quality metrics, and best practice recommendations. A written personalized care plan for preventive services as well as general preventive health recommendations were provided to patient.     Willette Brace, LPN   [id number]   Nurse Notes: None

## 2020-08-28 NOTE — Patient Instructions (Addendum)
[name] , Thank you for taking time to come for your Medicare Wellness Visit. I appreciate your ongoing commitment to your health goals. Please review the following plan we discussed and let me know if I can assist you in the future.   Screening recommendations/referrals: Colonoscopy: Done 05/28/18 Mammogram: Done 01/16/20 Bone Density: Done 10/19/14 Recommended yearly ophthalmology/optometry visit for glaucoma screening and checkup Recommended yearly dental visit for hygiene and checkup  Vaccinations: Influenza vaccine: Up to date Pneumococcal vaccine: Up to date Tdap vaccine: Up to date Shingles vaccine: Completed 3/16 & 09/27/17   Covid-19:Completed 11/18/18, 12/15/18, 11/03/19, & 07/10/20  Advanced directives: Advance directive discussed with you today. I have provided a copy for you to complete at home and have notarized. Once this is complete please bring a copy in to our office so we can scan it into your chart.  Conditions/risks identified: Get more physically fit   Next appointment: Follow up in one year for your annual wellness visit    Preventive Care 65 Years and Older, Female Preventive care refers to lifestyle choices and visits with your health care provider that can promote health and wellness. What does preventive care include?  A yearly physical exam. This is also called an annual well check.  Dental exams once or twice a year.  Routine eye exams. Ask your health care provider how often you should have your eyes checked.  Personal lifestyle choices, including:  Daily care of your teeth and gums.  Regular physical activity.  Eating a healthy diet.  Avoiding tobacco and drug use.  Limiting alcohol use.  Practicing safe sex.  Taking low-dose aspirin every day.  Taking vitamin and mineral supplements as recommended by your health care provider. What happens during an annual well check? The services and screenings done by your health care provider during  your annual well check will depend on your age, overall health, lifestyle risk factors, and family history of disease. Counseling  Your health care provider may ask you questions about your:  Alcohol use.  Tobacco use.  Drug use.  Emotional well-being.  Home and relationship well-being.  Sexual activity.  Eating habits.  History of falls.  Memory and ability to understand (cognition).  Work and work Statistician.  Reproductive health. Screening  You may have the following tests or measurements:  Height, weight, and BMI.  Blood pressure.  Lipid and cholesterol levels. These may be checked every 5 years, or more frequently if you are over 48 years old.  Skin check.  Lung cancer screening. You may have this screening every year starting at age 35 if you have a 30-pack-year history of smoking and currently smoke or have quit within the past 15 years.  Fecal occult blood test (FOBT) of the stool. You may have this test every year starting at age 36.  Flexible sigmoidoscopy or colonoscopy. You may have a sigmoidoscopy every 5 years or a colonoscopy every 10 years starting at age 33.  Hepatitis C blood test.  Hepatitis B blood test.  Sexually transmitted disease (STD) testing.  Diabetes screening. This is done by checking your blood sugar (glucose) after you have not eaten for a while (fasting). You may have this done every 1-3 years.  Bone density scan. This is done to screen for osteoporosis. You may have this done starting at age 17.  Mammogram. This may be done every 1-2 years. Talk to your health care provider about how often you should have regular mammograms. Talk with your health  care provider about your test results, treatment options, and if necessary, the need for more tests. Vaccines  Your health care provider may recommend certain vaccines, such as:  Influenza vaccine. This is recommended every year.  Tetanus, diphtheria, and acellular pertussis (Tdap,  Td) vaccine. You may need a Td booster every 10 years.  Zoster vaccine. You may need this after age 65.  Pneumococcal 13-valent conjugate (PCV13) vaccine. One dose is recommended after age 63.  Pneumococcal polysaccharide (PPSV23) vaccine. One dose is recommended after age 46. Talk to your health care provider about which screenings and vaccines you need and how often you need them. This information is not intended to replace advice given to you by your health care provider. Make sure you discuss any questions you have with your health care provider. Document Released: 04/13/2015 Document Revised: 12/05/2015 Document Reviewed: 01/16/2015 Elsevier Interactive Patient Education  2017 [place] Prevention in the Home Falls can cause injuries. They can happen to people of all ages. There are many things you can do to make your home safe and to help prevent falls. What can I do on the outside of my home?  Regularly fix the edges of walkways and driveways and fix any cracks.  Remove anything that might make you trip as you walk through a door, such as a raised step or threshold.  Trim any bushes or trees on the path to your home.  Use bright outdoor lighting.  Clear any walking paths of anything that might make someone trip, such as rocks or tools.  Regularly check to see if handrails are loose or broken. Make sure that both sides of any steps have handrails.  Any raised decks and porches should have guardrails on the edges.  Have any leaves, snow, or ice cleared regularly.  Use sand or salt on walking paths during winter.  Clean up any spills in your garage right away. This includes oil or grease spills. What can I do in the bathroom?  Use night lights.  Install grab bars by the toilet and in the tub and shower. Do not use towel bars as grab bars.  Use non-skid mats or decals in the tub or shower.  If you need to sit down in the shower, use a plastic, non-slip  stool.  Keep the floor dry. Clean up any water that spills on the floor as soon as it happens.  Remove soap buildup in the tub or shower regularly.  Attach bath mats securely with double-sided non-slip rug tape.  Do not have throw rugs and other things on the floor that can make you trip. What can I do in the bedroom?  Use night lights.  Make sure that you have a light by your bed that is easy to reach.  Do not use any sheets or blankets that are too big for your bed. They should not hang down onto the floor.  Have a firm chair that has side arms. You can use this for support while you get dressed.  Do not have throw rugs and other things on the floor that can make you trip. What can I do in the kitchen?  Clean up any spills right away.  Avoid walking on wet floors.  Keep items that you use a lot in easy-to-reach places.  If you need to reach something above you, use a strong step stool that has a grab bar.  Keep electrical cords out of the way.  Do not use floor  polish or wax that makes floors slippery. If you must use wax, use non-skid floor wax.  Do not have throw rugs and other things on the floor that can make you trip. What can I do with my stairs?  Do not leave any items on the stairs.  Make sure that there are handrails on both sides of the stairs and use them. Fix handrails that are broken or loose. Make sure that handrails are as long as the stairways.  Check any carpeting to make sure that it is firmly attached to the stairs. Fix any carpet that is loose or worn.  Avoid having throw rugs at the top or bottom of the stairs. If you do have throw rugs, attach them to the floor with carpet tape.  Make sure that you have a light switch at the top of the stairs and the bottom of the stairs. If you do not have them, ask someone to add them for you. What else can I do to help prevent falls?  Wear shoes that:  Do not have high heels.  Have rubber bottoms.  Are  comfortable and fit you well.  Are closed at the toe. Do not wear sandals.  If you use a stepladder:  Make sure that it is fully opened. Do not climb a closed stepladder.  Make sure that both sides of the stepladder are locked into place.  Ask someone to hold it for you, if possible.  Clearly mark and make sure that you can see:  Any grab bars or handrails.  First and last steps.  Where the edge of each step is.  Use tools that help you move around (mobility aids) if they are needed. These include:  Canes.  Walkers.  Scooters.  Crutches.  Turn on the lights when you go into a dark area. Replace any light bulbs as soon as they burn out.  Set up your furniture so you have a clear path. Avoid moving your furniture around.  If any of your floors are uneven, fix them.  If there are any pets around you, be aware of where they are.  Review your medicines with your doctor. Some medicines can make you feel dizzy. This can increase your chance of falling. Ask your doctor what other things that you can do to help prevent falls. This information is not intended to replace advice given to you by your health care provider. Make sure you discuss any questions you have with your health care provider. Document Released: 01/11/2009 Document Revised: 08/23/2015 Document Reviewed: 04/21/2014 Elsevier Interactive Patient Education  2017 [employer].

## 2020-08-29 ENCOUNTER — Other Ambulatory Visit: Payer: Self-pay | Admitting: Internal Medicine

## 2020-08-29 ENCOUNTER — Telehealth: Payer: Self-pay | Admitting: Pharmacist

## 2020-08-29 NOTE — Chronic Care Management (AMB) (Signed)
Chronic Care Management Pharmacy Assistant   Name: Amy Stokes  MRN: [id number] DOB: April 01, 1950  Reason for Encounter: Disease State   Conditions to be addressed/monitored: HTN  Recent office visits:  . 05.25.2022 Charlott Rakes Medicare annual wellness exam   Recent consult visits:  . 05.25.2022 [name], MD Pulmonary Disease patient present for follow-up for OSA.   Hospital visits:  None in previous 6 months  Medications: Outpatient Encounter Medications as of 08/29/2020  Medication Sig  . acetaminophen (TYLENOL) 650 MG CR tablet Take 650 mg by mouth every 8 (eight) hours as needed for pain.  [name] albuterol (PROAIR HFA) 108 (90 Base) MCG/ACT inhaler Inhale 2 puffs into the lungs every 6 (six) hours as needed for wheezing.  . Cholecalciferol 50 MCG (2000 UT) CAPS Take 1 capsule by mouth daily. PATIENT USING OIL AND NOT CAPSULES  . clobetasol ointment (TEMOVATE) 4.81 % Apply 1 application topically as needed.   . fluticasone-salmeterol (ADVAIR) 100-50 MCG/ACT AEPB Inhale 1 puff into the lungs 2 (two) times daily.  . hydrochlorothiazide (HYDRODIURIL) 25 MG tablet Take 1 tablet (25 mg total) by mouth daily.  [name] KLOR-CON M10 10 MEQ tablet TAKE 2 TABLETS BY MOUTH DAILY  . levothyroxine (SYNTHROID) 100 MCG tablet Take 100 mcg by mouth every morning.  . loratadine (CLARITIN) 10 MG tablet Take 10 mg by mouth daily.  [name] losartan (COZAAR) 100 MG tablet TAKE 1 TABLET BY MOUTH EVERY DAY  . Lutein 10 MG TABS Take by mouth.  . MULTIPLE VITAMIN PO Take 1 tablet by mouth daily.  . psyllium (METAMUCIL) 58.6 % powder Take 1 packet by mouth daily. (Patient not taking: Reported on 08/28/2020)  . Wheat Dextrin (BENEFIBER DRINK MIX PO) Take by mouth.   No facility-administered encounter medications on file as of 08/29/2020.    Reviewed chart prior to disease state call. Spoke with patient regarding BP  Recent Office Vitals: BP Readings from Last 3 Encounters:  08/28/20 132/72   08/22/20 124/80  07/12/20 (!) 145/69   Pulse Readings from Last 3 Encounters:  08/28/20 79  08/22/20 85  07/12/20 77    Wt Readings from Last 3 Encounters:  08/28/20 (!) 310 lb 3.2 oz (140.7 kg)  08/22/20 (!) 309 lb 9.6 oz (140.4 kg)  06/11/20 (!) 316 lb 9.6 oz (143.6 kg)     Kidney Function Lab Results  Component Value Date/Time   CREATININE 0.64 07/02/2020 08:05 AM   CREATININE 0.71 05/10/2020 03:46 AM   GFR 90.27 07/02/2020 08:05 AM   GFRNONAA >60 05/10/2020 03:46 AM   GFRAA 95 05/10/2008 09:44 AM    BMP Latest Ref Rng & Units 07/02/2020 05/10/2020 06/10/2019  Glucose 70 - 99 mg/dL 97 129(H) 95  BUN 6 - 23 mg/dL 14 19 15   Creatinine 0.40 - 1.20 mg/dL 0.64 0.71 0.59  Sodium 135 - 145 mEq/L 139 138 138  Potassium 3.5 - 5.1 mEq/L 3.2(L) 3.6 4.1  Chloride 96 - 112 mEq/L 96 97(L) 99  CO2 19 - 32 mEq/L 38(H) 30 34(H)  Calcium 8.4 - 10.5 mg/dL 8.9 8.8(L) 9.2   . Current antihypertensive regimen:  o Losartan 100 mg one tablet daily in am o Hydrochlorothiazide 12.5 mg two daily in am o Potassium daily . How often are you checking your Blood Pressure? 3-5x per week . Current home BP readings:  o 05.18 149/67 P80  o 05.19 142/64 P71 o 05.24 132/64 P71 o 05.25 133/77 P103 o 05.27 128/64 P73 . What  recent interventions/DTPs have been made by any provider to improve Blood Pressure control since last CPP Visit: None . Any recent hospitalizations or ED visits since last visit with CPP? No . What diet changes have been made to improve Blood Pressure Control?  o No Change . What exercise is being done to improve your Blood Pressure Control?  o No Change  Adherence Review: Is the patient currently on ACE/ARB medication? Yes Does the patient have >5 day gap between last estimated fill dates? No  I spoke with the patient about medication adherence. She stated that she has been doing well. She continues to take her medication as prescribed. She states that she likes to check her  blood pressure three to five times a week. She is not experiencing any side effects from her current medications. There have been no urgent care, hospital visit, or emergency department visits since her last CPP or PCP visit. She continues on her same diet and exercise routine. She is not experiencing any side effects from her current medications. Her next CCM follow-up appointment is for September 2022.  Star Rating Drugs: Medication Dispensed  Quantity Pharmacy  Losartan 100 mg  [facility].2022 [facility]   Zoha Spranger Revonda Standard, [place] Pharmacist Assistant [phone]

## 2020-09-20 ENCOUNTER — Other Ambulatory Visit: Payer: Self-pay | Admitting: Internal Medicine

## 2020-10-11 DIAGNOSIS — I10 Essential (primary) hypertension: Secondary | ICD-10-CM | POA: Diagnosis not present

## 2020-10-11 DIAGNOSIS — E02 Subclinical iodine-deficiency hypothyroidism: Secondary | ICD-10-CM | POA: Diagnosis not present

## 2020-10-12 ENCOUNTER — Telehealth: Payer: Self-pay | Admitting: Internal Medicine

## 2020-10-12 DIAGNOSIS — Z0189 Encounter for other specified special examinations: Secondary | ICD-10-CM

## 2020-10-12 LAB — TSH: TSH: 2 (ref <=5.90)

## 2020-10-12 NOTE — Telephone Encounter (Signed)
Patient called to schedule visit due to episodes of atrial defibrillation. She also said that her blood sugar level was 104 according to her recent bloodwork. Scheduled patient for next available in office visit with Dr. Regis Bill for 7/27.   Patient would like a referral to Dr. Rayann Heman at [facility] for cardiology.  Patient's contact number is [phone].  Please advise.

## 2020-10-15 ENCOUNTER — Other Ambulatory Visit: Payer: Self-pay | Admitting: Internal Medicine

## 2020-10-15 NOTE — Telephone Encounter (Signed)
Yes Although I dont see  dx  in the  record

## 2020-10-16 NOTE — Telephone Encounter (Signed)
Referral to cardiology per pt request

## 2020-10-16 NOTE — Telephone Encounter (Signed)
Referral to cardiology has been placed and the pt has been informed.

## 2020-10-17 ENCOUNTER — Telehealth: Payer: Self-pay | Admitting: Pharmacist

## 2020-10-17 NOTE — Chronic Care Management (AMB) (Signed)
Chronic Care Management Pharmacy Assistant   Name: Amy Stokes  MRN: [id number] DOB: 03-Sep-1950   Reason for Encounter: Disease State/ Hypertension Assessment Call.   Conditions to be addressed/monitored: HTN  Recent office visits:  10/24/20 Shanon Ace MD (PCP) - seen for cardiac arrhythmia and other issues. Patient started on baby aspirin and psyllium discontinued. echocardiogram ordered. Keep follow up appointment with pulmonology. Follow up with PCP as needed.   Recent consult visits:  None.   Hospital visits:  None in previous 6 months  Medications: Outpatient Encounter Medications as of 10/17/2020  Medication Sig   acetaminophen (TYLENOL) 650 MG CR tablet Take 650 mg by mouth every 8 (eight) hours as needed for pain.   albuterol (PROAIR HFA) 108 (90 Base) MCG/ACT inhaler Inhale 2 puffs into the lungs every 6 (six) hours as needed for wheezing.   Cholecalciferol 50 MCG (2000 UT) CAPS Take 1 capsule by mouth daily. PATIENT USING OIL AND NOT CAPSULES   clobetasol ointment (TEMOVATE) 9.21 % Apply 1 application topically as needed.    fluticasone-salmeterol (ADVAIR) 100-50 MCG/ACT AEPB Inhale 1 puff into the lungs 2 (two) times daily.   hydrochlorothiazide (HYDRODIURIL) 25 MG tablet Take 1 tablet (25 mg total) by mouth daily.   KLOR-CON M10 10 MEQ tablet TAKE 2 TABLETS BY MOUTH DAILY   levothyroxine (SYNTHROID) 100 MCG tablet Take 100 mcg by mouth every morning.   loratadine (CLARITIN) 10 MG tablet Take 10 mg by mouth daily.   losartan (COZAAR) 100 MG tablet TAKE 1 TABLET BY MOUTH EVERY DAY   Lutein 10 MG TABS Take by mouth.   MULTIPLE VITAMIN PO Take 1 tablet by mouth daily.   psyllium (METAMUCIL) 58.6 % powder Take 1 packet by mouth daily. (Patient not taking: Reported on 08/28/2020)   Wheat Dextrin (BENEFIBER DRINK MIX PO) Take by mouth.   No facility-administered encounter medications on file as of 10/17/2020.   Fill History: ADVAIR 100-50 DISKUS 08/07/2020 90    LEVOTHYROXINE 100 MCG TABLET 09/22/2020 90   LOSARTAN POTASSIUM 100 MG TAB 07/23/2020 90   KLOR-CON M10 10MEQ ER TAB 10/16/2020 90   HYDROCHLOROTHIAZIDE 12.5 MG CP 08/13/2020 90   Reviewed chart prior to disease state call. Spoke with patient regarding BP  Recent Office Vitals: BP Readings from Last 3 Encounters:  08/28/20 132/72  08/22/20 124/80  07/12/20 (!) 145/69   Pulse Readings from Last 3 Encounters:  08/28/20 79  08/22/20 85  07/12/20 77    Wt Readings from Last 3 Encounters:  08/28/20 (!) 310 lb 3.2 oz (140.7 kg)  08/22/20 (!) 309 lb 9.6 oz (140.4 kg)  06/11/20 (!) 316 lb 9.6 oz (143.6 kg)     Kidney Function Lab Results  Component Value Date/Time   CREATININE 0.64 07/02/2020 08:05 AM   CREATININE 0.71 05/10/2020 03:46 AM   GFR 90.27 07/02/2020 08:05 AM   GFRNONAA >60 05/10/2020 03:46 AM   GFRAA 95 05/10/2008 09:44 AM    BMP Latest Ref Rng & Units 07/02/2020 05/10/2020 06/10/2019  Glucose 70 - 99 mg/dL 97 129(H) 95  BUN 6 - 23 mg/dL 14 19 15   Creatinine 0.40 - 1.20 mg/dL 0.64 0.71 0.59  Sodium 135 - 145 mEq/L 139 138 138  Potassium 3.5 - 5.1 mEq/L 3.2(L) 3.6 4.1  Chloride 96 - 112 mEq/L 96 97(L) 99  CO2 19 - 32 mEq/L 38(H) 30 34(H)  Calcium 8.4 - 10.5 mg/dL 8.9 8.8(L) 9.2    Current antihypertensive regimen:  Losartan  100 mg one tablet daily in am Hydrochlorothiazide 12.5 mg two daily in am How often are you checking your Blood Pressure? 3-5x per week Current home BP readings: 10/21/20 - 112/63  P:75, 10/20/20 - 142/65 P: 74, 10/03/20 - in suspected A-FIB 167/78 P: 108. Patient rechecked her blood pressure right after and it was 130/89 P:85.  What recent interventions/DTPs have been made by any provider to improve Blood Pressure control since last CPP Visit: None.  Any recent hospitalizations or ED visits since last visit with CPP? No  Adherence Review: Is the patient currently on ACE/ARB medication? Yes Does the patient have >5 day gap between last  estimated fill dates? No  Notes: Spoke with patient and reviewed medications as prescribed. Patient reports taking all medications as prescribed and no issues at this time. Patient was recently started on an 81mg  Aspirin for suspected A-FIB. Patient will be getting an echocardiogram soon with cardiology. Patient states she tends to have things like 2 eggs, english muffin, and some chicken sausage. Sometimes she has a bowl of granola or oatmeal with a cup of coffee. Patient stated she usually has like homemade fruit smoothie for lunch. Sometimes she will make some tuna and mayo with relish with crackers. Patient drinks around a quart of water a day. Sometimes patient will eat out for dinner but mostly cooks at home. Patient has red meat rarely she will an occasional hamburger and bacon like once a month. Patient does add salt to her food but not a lot. Patient gets at least two servings of vegetables a day. For activity she attends the [employer] [facility] around 4 days a week. She does cardio and weight bearing exercises 3 days a week and on wednesdays she does water yoga. Patient is able to do things around home with no issues. Patient checks her blood pressure multiple times a week and keeps a log. She gave her PCP a copy of her log at her recent office visit as well. Patient thanked me for my call.  Care Gaps:   AWV-  scheduled for 09/10/21 Pap Smear - modifier - overdue since 09/29/15  Star Rating Drugs:  Losartan 100mg  - last filled on 07/23/20 90DS at [place] Pharmacist Assistant [phone]

## 2020-10-18 NOTE — Telephone Encounter (Cosign Needed)
2nd attempt

## 2020-10-23 NOTE — Progress Notes (Signed)
Chief Complaint  Patient presents with   Atrial Fibrillation    Patient complains of Artrial fibrillation, xmonths, Patient reports shortness of breath with no chest tightness or pressure.    Blood Sugar Problem    Patient complains of blood sugar, Pt reports numbers is 102,     HPI: DEMII LEDER 70 y.o. come in for problem based visit. Had labs done by Dr. Michiel Sites thyroid was normal blood sugar was borderline elevated at 104.  Potassium was 3.5.  Seen by me April video  but  has had ccd and  pulm since then has been having intermittent wheezing inhalers might end up being changed but it comes and goes from day to day  She has had for the last several months episodes of heart rhythm elevation this picked up on her apple watch and diagnosed as atrial fibrillation.  May last hours rate is in the 04/20/1928 range no syncope but just does not feel well with it.  No associated chest pain shortness of breath increased with it.  Not associated with exercise tends to be in the evening and also when lays down.  Does not wake her up from sleep. No excess edema cough fever. Is been going to [place] well doing her exercises and feels good about this. She did make a ointment with cardiology that in September. Ekg  : 2 22 no acute findings Pulm  :   as meeded albuterol and   controller .  Blood pressure readings reports today has log some in the 140 range others in the 04/20/1928 range.  Pulse is in the 70s 80s. ROS: See pertinent positives and negatives per HPI.  Past Medical History:  Diagnosis Date   Arthritis    no meds   Asthma    Edema    Fibroids    Hyperlipidemia    Hypertension    Obesity    Sleep apnea    wears cpap    Thyroid nodule 01/2009   left discovered on chest ct; needle bx "indeterminant" cannon r/o follicular neoplasm     Family History  Problem Relation Age of Onset   Arthritis Mother    Other Mother        neurological disorder   Pancreatic cancer Mother     Heart disease Father    Thyroid nodules Sister    Heart disease Brother        x2 1 brother deceased   Colon cancer Neg Hx    Colon polyps Neg Hx    Esophageal cancer Neg Hx    Rectal cancer Neg Hx    Stomach cancer Neg Hx     Social History   Socioeconomic History   Marital status: Married    Spouse name: Not on file   Number of children: 0   Years of education: Not on file   Highest education level: Not on file  Occupational History   Occupation: retired  Tobacco Use   Smoking status: Former    Packs/day: 1.00    Years: 1.00    Pack years: 1.00    Types: Cigarettes    Quit date: 04/29/1971    Years since quitting: 49.5   Smokeless tobacco: Never  Vaping Use   Vaping Use: Never used  Substance and Sexual Activity   Alcohol use: Yes    Alcohol/week: 0.0 standard drinks    Comment: occasionally   Drug use: No   Sexual activity: Not on file  Other Topics Concern  Not on file  Social History Narrative   Married   Occupation: Chiropractor currently unemployed   Married   Regular exercise-no   [place] of 2   Pet cat      Social Determinants of Radio broadcast assistant Strain: Low Risk    Difficulty of Paying Living Expenses: Not hard at all  Food Insecurity: No Food Insecurity   Worried About Charity fundraiser in the Last Year: Never true   Arboriculturist in the Last Year: Never true  Transportation Needs: No Transportation Needs   Lack of Transportation (Medical): No   Lack of Transportation (Non-Medical): No  Physical Activity: Sufficiently Active   Days of Exercise per Week: 3 days   Minutes of Exercise per Session: 90 min  Stress: No Stress Concern Present   Feeling of Stress : Not at all  Social Connections: Moderately Integrated   Frequency of Communication with Friends and Family: More than three times a week   Frequency of Social Gatherings with Friends and Family: Three times a week   Attends Religious Services: Never   Active Member of  Clubs or Organizations: Yes   Attends Archivist Meetings: 1 to 4 times per year   Marital Status: Married    Outpatient Medications Prior to Visit  Medication Sig Dispense Refill   acetaminophen (TYLENOL) 650 MG CR tablet Take 650 mg by mouth every 8 (eight) hours as needed for pain.     albuterol (PROAIR HFA) 108 (90 Base) MCG/ACT inhaler Inhale 2 puffs into the lungs every 6 (six) hours as needed for wheezing. 1 each 2   Cholecalciferol 50 MCG (2000 UT) CAPS Take 1 capsule by mouth daily. PATIENT USING OIL AND NOT CAPSULES     clobetasol ointment (TEMOVATE) AB-123456789 % Apply 1 application topically as needed.      fluticasone-salmeterol (ADVAIR) 100-50 MCG/ACT AEPB Inhale 1 puff into the lungs 2 (two) times daily. 1 each 3   hydrochlorothiazide (HYDRODIURIL) 25 MG tablet Take 1 tablet (25 mg total) by mouth daily. 90 tablet 0   KLOR-CON M10 10 MEQ tablet TAKE 2 TABLETS BY MOUTH DAILY 60 tablet 2   levothyroxine (SYNTHROID) 100 MCG tablet Take 100 mcg by mouth every morning.     loratadine (CLARITIN) 10 MG tablet Take 10 mg by mouth daily.     losartan (COZAAR) 100 MG tablet TAKE 1 TABLET BY MOUTH EVERY DAY 90 tablet 1   Lutein 10 MG TABS Take by mouth.     MULTIPLE VITAMIN PO Take 1 tablet by mouth daily.     Wheat Dextrin (BENEFIBER DRINK MIX PO) Take by mouth.     psyllium (METAMUCIL) 58.6 % powder Take 1 packet by mouth daily. (Patient not taking: Reported on 08/28/2020)     No facility-administered medications prior to visit.     EXAM:  BP 128/84 (BP Location: Left Arm, Patient Position: Sitting, Cuff Size: Large)   Pulse 81   Temp 98.7 F (37.1 C) (Oral)   Ht '5\' 9"'$  (1.753 m)   Wt (!) 307 lb (139.3 kg)   SpO2 93%   BMI 45.34 kg/m   Body mass index is 45.34 kg/m. Wt Readings from Last 3 Encounters:  10/24/20 (!) 307 lb (139.3 kg)  08/28/20 (!) 310 lb 3.2 oz (140.7 kg)  08/22/20 (!) 309 lb 9.6 oz (140.4 kg)    GENERAL: vitals reviewed and listed above, alert,  oriented, appears well hydrated and in no  acute distress HEENT: atraumatic, conjunctiva  clear, no obvious abnormalities on inspection of external nose and ears OP : n masked NECK: no obvious masses on inspection palpation  LUNGS: clear to auscultation bilaterally, no wheezes, rales or rhonchi, good air movement CV: HRRR, no clubbing cyanosis or  nl cap refill  MS: moves all extremities without noticeable focal  abnormality Abdomen soft no obvious masses large abdomen no obvious organomegaly. PSYCH: pleasant and cooperative, no obvious depression or anxiety Lab Results  Component Value Date   WBC 9.8 05/10/2020   HGB 13.6 05/10/2020   HCT 43.1 05/10/2020   PLT 267 05/10/2020   GLUCOSE 97 07/02/2020   CHOL 174 07/02/2020   TRIG 95.0 07/02/2020   HDL 39.30 07/02/2020   LDLDIRECT 141.7 04/03/2011   LDLCALC 116 (H) 07/02/2020   ALT 25 05/10/2020   AST 21 05/10/2020   NA 139 07/02/2020   K 3.2 (L) 07/02/2020   CL 96 07/02/2020   CREATININE 0.64 07/02/2020   BUN 14 07/02/2020   CO2 38 (H) 07/02/2020   TSH 2.21 06/10/2019   INR 0.98 08/02/2013   HGBA1C 5.7 (A) 10/24/2020   BP Readings from Last 3 Encounters:  10/24/20 128/84  08/28/20 132/72  08/22/20 124/80   Laboratory studies from Dr. Michiel Sites reviewed TSH 2.06 FBS 104 potassium 3.5 sodium 141 creatinine 0.61 estimated GFR 92 ASSESSMENT AND PLAN:  Discussed the following assessment and plan:  Cardiac arrhythmia, unspecified cardiac arrhythmia type - apple watch says a fib  - Plan: ECHOCARDIOGRAM COMPLETE  Fasting hyperglycemia - a1c 5.7 - Plan: POCT glycosylated hemoglobin (Hb A1C)  Essential hypertension - Plan: ECHOCARDIOGRAM COMPLETE  Medication management  OSA (obstructive sleep apnea) - Plan: ECHOCARDIOGRAM COMPLETE  MORBID OBESITY Plan echocardiogram information to cardiology.   Underlying pulmonary  condition treated with bronchodilators sees pulmonary obstructive sleep apnea of note she had an echo in 2019 was  slightly dilated RV uncertain what follow-up happened with cardiology.[name]  Restart the daily ASA in interim.  Keep follow-up appointment with pulmonary. Hypertension reasonably controlled at this time continue monitoring -Patient advised to return or notify health care team  if  new concerns arise.  Patient Instructions   Agree with  seeing cardiology for poss monitor . Etc .   Will order an echocardiogram in the interim .    Continue healthy    activity  eating.    Continue bp monitoring .  Bp is good today .   Hg a1c is 5.7     Mariann Laster K. Deirdre Gryder M.D.

## 2020-10-24 ENCOUNTER — Other Ambulatory Visit: Payer: Self-pay

## 2020-10-24 ENCOUNTER — Encounter: Payer: Self-pay | Admitting: Internal Medicine

## 2020-10-24 ENCOUNTER — Ambulatory Visit (INDEPENDENT_AMBULATORY_CARE_PROVIDER_SITE_OTHER): Payer: Medicare HMO | Admitting: Internal Medicine

## 2020-10-24 VITALS — BP 128/84 | HR 81 | Temp 98.7°F | Ht 69.0 in | Wt 307.0 lb

## 2020-10-24 DIAGNOSIS — I499 Cardiac arrhythmia, unspecified: Secondary | ICD-10-CM | POA: Diagnosis not present

## 2020-10-24 DIAGNOSIS — R7301 Impaired fasting glucose: Secondary | ICD-10-CM

## 2020-10-24 DIAGNOSIS — G4733 Obstructive sleep apnea (adult) (pediatric): Secondary | ICD-10-CM | POA: Diagnosis not present

## 2020-10-24 DIAGNOSIS — I10 Essential (primary) hypertension: Secondary | ICD-10-CM

## 2020-10-24 DIAGNOSIS — Z79899 Other long term (current) drug therapy: Secondary | ICD-10-CM

## 2020-10-24 LAB — POCT GLYCOSYLATED HEMOGLOBIN (HGB A1C): Hemoglobin A1C: 5.7 % — AB (ref 4.0–5.6)

## 2020-10-24 NOTE — Telephone Encounter (Cosign Needed)
Pt called back and I left her another vm.

## 2020-10-24 NOTE — Patient Instructions (Addendum)
  Agree with  seeing cardiology for poss monitor . Etc .   Will order an echocardiogram in the interim .    Continue healthy    activity  eating.    Continue bp monitoring .  Bp is good today .   Hg a1c is 5.7

## 2020-10-24 NOTE — Telephone Encounter (Cosign Needed)
3rd attempt

## 2020-10-25 NOTE — Telephone Encounter (Cosign Needed)
Patient returned my call and I left another vm to call back.

## 2020-11-01 ENCOUNTER — Other Ambulatory Visit: Payer: Self-pay

## 2020-11-01 ENCOUNTER — Ambulatory Visit (HOSPITAL_COMMUNITY)
Admission: RE | Admit: 2020-11-01 | Discharge: 2020-11-01 | Disposition: A | Discharge status: Home / Self Care | Payer: Medicare HMO | Source: Ambulatory Visit | Attending: Internal Medicine | Admitting: Internal Medicine

## 2020-11-01 DIAGNOSIS — I499 Cardiac arrhythmia, unspecified: Secondary | ICD-10-CM

## 2020-11-01 DIAGNOSIS — E785 Hyperlipidemia, unspecified: Secondary | ICD-10-CM | POA: Insufficient documentation

## 2020-11-01 DIAGNOSIS — G4733 Obstructive sleep apnea (adult) (pediatric): Secondary | ICD-10-CM

## 2020-11-01 DIAGNOSIS — I10 Essential (primary) hypertension: Secondary | ICD-10-CM

## 2020-11-01 DIAGNOSIS — I4891 Unspecified atrial fibrillation: Secondary | ICD-10-CM | POA: Diagnosis not present

## 2020-11-01 LAB — ECHOCARDIOGRAM COMPLETE
Area-P 1/2: 3.6 cm2
S' Lateral: 3 cm

## 2020-11-02 ENCOUNTER — Telehealth: Payer: Self-pay | Admitting: Internal Medicine

## 2020-11-02 DIAGNOSIS — E02 Subclinical iodine-deficiency hypothyroidism: Secondary | ICD-10-CM | POA: Diagnosis not present

## 2020-11-02 DIAGNOSIS — E049 Nontoxic goiter, unspecified: Secondary | ICD-10-CM | POA: Diagnosis not present

## 2020-11-02 DIAGNOSIS — I10 Essential (primary) hypertension: Secondary | ICD-10-CM | POA: Diagnosis not present

## 2020-11-02 NOTE — Telephone Encounter (Signed)
Please see results note.

## 2020-11-02 NOTE — Telephone Encounter (Signed)
Pt call and stated she is returning your call and want a call back. 

## 2020-11-02 NOTE — Progress Notes (Signed)
No major abnormalities  valves  LV . Forwarding to Dr. Harrell Gave for upcoming appt.

## 2020-11-05 DIAGNOSIS — H40013 Open angle with borderline findings, low risk, bilateral: Secondary | ICD-10-CM | POA: Diagnosis not present

## 2020-11-16 DIAGNOSIS — H40011 Open angle with borderline findings, low risk, right eye: Secondary | ICD-10-CM | POA: Diagnosis not present

## 2020-11-20 ENCOUNTER — Telehealth: Payer: Self-pay | Admitting: Pulmonary Disease

## 2020-11-20 MED ORDER — FLUTICASONE-SALMETEROL 250-50 MCG/ACT IN AEPB: 1.0000 | INHALATION_SPRAY | Freq: Two times a day (BID) | RESPIRATORY_TRACT | 11 refills | Status: DC

## 2020-11-20 NOTE — Telephone Encounter (Signed)
   Sent refill into [facility]. Patient is aware.

## 2020-12-03 NOTE — Progress Notes (Signed)
Tsh in 2 range per dr Chalmers Cater done in July  please make sure  result is abstracted

## 2020-12-04 ENCOUNTER — Telehealth: Payer: Self-pay | Admitting: Pharmacist

## 2020-12-04 NOTE — Chronic Care Management (AMB) (Addendum)
    Chronic Care Management Pharmacy Assistant   Name: ELIBETH CASTILLEJO  MRN: [id number] DOB: [birth year]  12/05/20 APPOINTMENT REMINDER   [name] was reminded to have all medications, supplements and any blood glucose and blood pressure readings available for review with [name], Pharm. D, at her telephone visit on 12/05/20 at [place] and rescheduled patient per [name] the clinical pharmacist to 02/05/21 at 4pm via telephone.  Questions: Have you had any recent office visit or specialist visit outside of [place]? Yes. Patient seen her eye doctor.  Are there any concerns you would like to discuss during your office visit? Cannot think of any thing at this time.   Are you having any problems obtaining your medications? Cannot think of anything at this time.  If patient has any PAP medications ask if they are having any problems getting their PAP medication or refill? No patient assistance at this time.  Notes: [facility] last had the atorvastatin filled on 11/05/20 but has not picked up. The date below is accurate.  Care Gaps:  AWV - scheduled for 09/10/21 PAP smear - overdue since 2017 Flu vaccine - due Covid-19 vaccine booster 5 - due  Star Rating Drug:  Losartan '100mg'$  - [facility]  Any gaps in medications fill history? YES.  [place]  Clinical Pharmacist Assistant [phone]

## 2020-12-05 ENCOUNTER — Telehealth: Payer: Medicare HMO

## 2020-12-06 DIAGNOSIS — H40012 Open angle with borderline findings, low risk, left eye: Secondary | ICD-10-CM | POA: Diagnosis not present

## 2020-12-10 ENCOUNTER — Ambulatory Visit (HOSPITAL_BASED_OUTPATIENT_CLINIC_OR_DEPARTMENT_OTHER): Payer: Medicare HMO | Admitting: Cardiology

## 2020-12-10 ENCOUNTER — Encounter (HOSPITAL_BASED_OUTPATIENT_CLINIC_OR_DEPARTMENT_OTHER): Payer: Self-pay | Admitting: Cardiology

## 2020-12-10 ENCOUNTER — Other Ambulatory Visit: Payer: Self-pay

## 2020-12-10 VITALS — BP 140/70 | HR 101 | Ht 68.0 in | Wt 303.5 lb

## 2020-12-10 DIAGNOSIS — Z7189 Other specified counseling: Secondary | ICD-10-CM

## 2020-12-10 DIAGNOSIS — I10 Essential (primary) hypertension: Secondary | ICD-10-CM

## 2020-12-10 DIAGNOSIS — G4733 Obstructive sleep apnea (adult) (pediatric): Secondary | ICD-10-CM

## 2020-12-10 DIAGNOSIS — Z7901 Long term (current) use of anticoagulants: Secondary | ICD-10-CM | POA: Diagnosis not present

## 2020-12-10 DIAGNOSIS — D6869 Other thrombophilia: Secondary | ICD-10-CM

## 2020-12-10 DIAGNOSIS — I480 Paroxysmal atrial fibrillation: Secondary | ICD-10-CM

## 2020-12-10 MED ORDER — DILTIAZEM HCL ER COATED BEADS 240 MG PO CP24: 240.0000 mg | ORAL_CAPSULE | Freq: Every day | ORAL | 3 refills | Status: DC

## 2020-12-10 MED ORDER — APIXABAN 5 MG PO TABS: 5.0000 mg | ORAL_TABLET | Freq: Two times a day (BID) | ORAL | 2 refills | Status: DC

## 2020-12-10 NOTE — Patient Instructions (Addendum)
Medication Instructions:  START ELIQUIS 5 MG TWICE A DAY   START DILTIAZEM 240 MG DAILY   *If you need a refill on your cardiac medications before your next appointment, please call your pharmacy*  Lab Work: NONE  Testing/Procedures: NONE  Follow-Up: At [facility], you and your health needs are our priority.  As part of our continuing mission to provide you with exceptional heart care, we have created designated Provider Care Teams.  These Care Teams include your primary Cardiologist (physician) and Advanced Practice Providers (APPs -  Physician Assistants and Nurse Practitioners) who all work together to provide you with the care you need, when you need it.  We recommend signing up for the patient portal called "MyChart".  Sign up information is provided on this After Visit Summary.  MyChart is used to connect with patients for Virtual Visits (Telemedicine).  Patients are able to view lab/test results, encounter notes, upcoming appointments, etc.  Non-urgent messages can be sent to your provider as well.   To learn more about what you can do with MyChart, go to [email or web address].    Your next appointment:   4-6 week(s)  The format for your next appointment:   In Person  Provider:   [name], NP

## 2020-12-10 NOTE — Progress Notes (Signed)
Cardiology Office Note:    Date:  12/10/2020   ID:  Amy Stokes, DOB 16-May-1950, MRN [id number]  PCP:  Burnis Medin, MD  Cardiologist:  [name], MD  Referring MD: Burnis Medin, MD   CC: new patient evaluation for for elevated heart rate, concern for atrial fibrillation  History of Present Illness:    Amy Stokes is a 70 y.o. female with a hx of hypertension, asthma, OSA, morbid obesity who is seen as a new consult at the request of Panosh, [name], MD for the evaluation and management of elevated heart rate, concern for atrial fibrillation.  Per note from Dr. Regis Bill dated 10/24/20, noted episodes of elevated heart rate that her apple watch noted as atrial fibrillation. Echo ordered, as below. Referred for further evaluation.  Today: She is in atrial fibrillation today by ECG, first formal diagnosis.  In general doing well. Sleeps with CPAP nightly. Dr. Ander Slade recommended an additional pillow to help raise her O2 sats at night, which helped with her old mattress. New mattress is softer, has noted occasional O2 readings down to 86. Works out at [employer] 3x/week, both cardio and [employer]. Does not feel limitations when she is exercising.   She has mild symptoms when she is in afib, feels more limited in her exercise. Her symptoms suggest she is very paroxysmal, going in and out frequently.   Cardiovascular risk factors: hypertension, OSA, morbid obesity Prior clinical ASCVD: none Comorbid conditions: Endorses hypertension, hyperlipidemia; Denies diabetes, chronic kidney disease  Metabolic syndrome/Obesity: interested in weight loss, discussing GLP1RA with her endocrinologist. Tobacco use history: former Prior pertinent testing and/or incidental findings: echo as below.  Denies chest pain, shortness of breath at rest or with normal exertion. No PND, orthopnea, or unexpected weight gain. No syncope. Rare LE edema, improves with avoiding salt and  getting exercise.  Past Medical History:  Diagnosis Date   Arthritis    no meds   Asthma    Edema    Fibroids    Hyperlipidemia    Hypertension    Obesity    Sleep apnea    wears cpap    Thyroid nodule 01/2009   left discovered on chest ct; needle bx "indeterminant" cannon r/o follicular neoplasm     Past Surgical History:  Procedure Laterality Date   ABDOMINAL HYSTERECTOMY Bilateral 08/02/2013   Procedure: TOTAL ABDOMINAL HYSTERECTOMY WITH BILATERAL SALPINGO-OOPHORECTOMY ;  Surgeon: Terrance Mass, MD;  Location: [facility];  Service: Gynecology;  Laterality: Bilateral;    Dr. Phineas Real to assist.  Dr. Zella Richer to follow with an umbilical hernia repair. He will need an additional 1 1/2 hours.    COLONOSCOPY  12/27/2007   [facility]    HERNIA REPAIR     MOUTH SURGERY     thyroid needle bx     TONSILLECTOMY     UMBILICAL HERNIA REPAIR N/A 08/02/2013   Procedure: HERNIA REPAIR UMBILICAL ADULT WITH MESH;  Surgeon: Odis Hollingshead, MD;  Location: [facility];  Service: General;  Laterality: N/A;    Current Medications: Current Outpatient Medications on File Prior to Visit  Medication Sig   acetaminophen (TYLENOL) 650 MG CR tablet Take 650 mg by mouth every 8 (eight) hours as needed for pain.   albuterol (PROAIR HFA) 108 (90 Base) MCG/ACT inhaler Inhale 2 puffs into the lungs every 6 (six) hours as needed for wheezing.   Cholecalciferol 50 MCG (2000 UT) CAPS Take 1 capsule by mouth daily. PATIENT USING  OIL AND NOT CAPSULES   fluticasone-salmeterol (ADVAIR DISKUS) 250-50 MCG/ACT AEPB Inhale 1 puff into the lungs in the morning and at bedtime.   hydrochlorothiazide (HYDRODIURIL) 25 MG tablet Take 1 tablet (25 mg total) by mouth daily.   ketorolac (ACULAR) 0.5 % ophthalmic solution Place 1 drop into the right eye 2 (two) times daily.   KLOR-CON M10 10 MEQ tablet TAKE 2 TABLETS BY MOUTH DAILY   levothyroxine (SYNTHROID) 100 MCG tablet Take 100 mcg by mouth every morning.   loratadine  (CLARITIN) 10 MG tablet Take 10 mg by mouth daily.   losartan (COZAAR) 100 MG tablet TAKE 1 TABLET BY MOUTH EVERY DAY   Lutein 10 MG TABS Take by mouth.   MULTIPLE VITAMIN PO Take 1 tablet by mouth daily.   Wheat Dextrin (BENEFIBER DRINK MIX PO) Take by mouth.   No current facility-administered medications on file prior to visit.     Allergies:   Patient has no known allergies.   Social History   Tobacco Use   Smoking status: Former    Packs/day: 1.00    Years: 1.00    Pack years: 1.00    Types: Cigarettes    Quit date: 04/29/1971    Years since quitting: 49.6   Smokeless tobacco: Never  Vaping Use   Vaping Use: Never used  Substance Use Topics   Alcohol use: Yes    Alcohol/week: 0.0 standard drinks    Comment: occasionally   Drug use: No    Family History: family history includes Arthritis in her mother; Heart disease in her brother and father; Other in her mother; Pancreatic cancer in her mother; Thyroid nodules in her sister. There is no history of Colon cancer, Colon polyps, Esophageal cancer, Rectal cancer, or Stomach cancer.  ROS:   Please see the history of present illness.  Additional pertinent ROS: Constitutional: Negative for chills, fever, night sweats, unintentional weight loss  HENT: Negative for ear pain and hearing loss.   Eyes: Negative for loss of vision and eye pain.  Respiratory: Negative for wheezing.   Cardiovascular: See HPI. Gastrointestinal: Negative for abdominal pain, melena, and hematochezia.  Genitourinary: Negative for dysuria and hematuria.  Musculoskeletal: Negative for falls and myalgias.  Skin: Negative for itching and rash.  Neurological: Negative for focal weakness, focal sensory changes and loss of consciousness.  Endo/Heme/Allergies: Does not bruise/bleed easily.     EKGs/Labs/Other Studies Reviewed:    The following studies were reviewed today: Echo 11/01/20 1. Left ventricular ejection fraction, by estimation, is 55 to 60%. The   left ventricle has normal function. The left ventricle has no regional  wall motion abnormalities. Left ventricular diastolic parameters are  indeterminate.   2. Right ventricular systolic function is normal. The right ventricular  size is mildly enlarged. Tricuspid regurgitation signal is inadequate for assessing PA pressure.   3. The mitral valve is grossly normal. No evidence of mitral valve  regurgitation. No evidence of mitral stenosis.   4. The aortic valve is grossly normal. Aortic valve regurgitation is not  visualized. No aortic stenosis is present.   5. The inferior vena cava is dilated in size with >50% respiratory  variability, suggesting right atrial pressure of 8 mmHg.   Comparison(s): No significant change from prior study.   EKG:  EKG is personally reviewed.   12/10/20 Atrial fibrillation with RVR at 101 bpm  Recent Labs: 05/10/2020: ALT 25; Hemoglobin 13.6; Platelets 267 07/02/2020: BUN 14; Creatinine, Ser 0.64; Potassium 3.2; Sodium 139 10/12/2020:  TSH 2.00  Recent Lipid Panel    Component Value Date/Time   CHOL 174 07/02/2020 0805   TRIG 95.0 07/02/2020 0805   HDL 39.30 07/02/2020 0805   CHOLHDL 4 07/02/2020 0805   VLDL 19.0 07/02/2020 0805   LDLCALC 116 (H) 07/02/2020 0805   LDLDIRECT 141.7 04/03/2011 1043    Physical Exam:    VS:  BP 140/70   Pulse (!) 101   Ht '5\' 8"'$  (1.727 m)   Wt (!) 303 lb 8 oz (137.7 kg)   SpO2 96%   BMI 46.15 kg/m     Wt Readings from Last 3 Encounters:  12/10/20 (!) 303 lb 8 oz (137.7 kg)  10/24/20 (!) 307 lb (139.3 kg)  08/28/20 (!) 310 lb 3.2 oz (140.7 kg)    GEN: Well nourished, well developed in no acute distress HEENT: Normal, moist mucous membranes NECK: No JVD CARDIAC: regular rhythm on exam, normal S1 and S2, no rubs or gallops. No murmur. VASCULAR: Radial and DP pulses 2+ bilaterally. No carotid bruits RESPIRATORY:  Clear to auscultation without rales, wheezing or rhonchi  ABDOMEN: Soft, non-tender,  non-distended MUSCULOSKELETAL:  Ambulates independently SKIN: Warm and dry, trivial nonpitting edema with chronic venous stasis changes.  NEUROLOGIC:  Alert and oriented x 3. No focal neuro deficits noted. PSYCHIATRIC:  Normal affect    ASSESSMENT:    1. Paroxysmal atrial fibrillation (HCC)   2. Long term current use of anticoagulant   3. Secondary hypercoagulable state ([place])   4. Essential hypertension   5. OSA (obstructive sleep apnea)   6. Cardiac risk counseling   7. Counseling on health promotion and disease prevention    PLAN:    Paroxysmal atrial fibrillation: -confirmed on ECG today. Back in sinus by auscultation at time of exam.  CHA2DS2/VAS Stroke Risk Points = 3   Points Metrics  0 Has Congestive Heart Failure:  No    Current as of about an hour ago  0 Has Vascular Disease:  No    Current as of about an hour ago  1 Has Hypertension:  Yes    Current as of about an hour ago  1 Age:  51    Current as of about an hour ago  0 Has Diabetes:  No    Current as of about an hour ago  0 Had Stroke:  No  Had TIA:  No  Had Thromboembolism:  No    Current as of about an hour ago  1 Female:  Yes    Current as of about an hour ago   We discussed atrial fibrillation at length today -Afib is abnormal rhythm of the top chamber of the heart. It can be triggered by many different things, including stress, infection, surgery, age, etc. The rhythm itself, when not fast, is not life threatening. We make sure the heart rate is not fast, and we talk about the risk of small blood clots forming in the heart. When afib lasts for >48 hours, there is a risk of small clots forming and breaking loose, causing a stroke. We prevent this with blood thinning medication, but these medications also increase risk for bleeding. We discuss the balance between bleeding and stroke with each individual patient.   -started on 81 mg aspirin by PCP, discussed this is not sufficient for stroke prevention in atrial  fibrillation. -discussed DOAC, she is amenable. Stop aspirin. -she will discuss if she can continue to donate blood  -she has mild symptoms with afib. Discussed  rate vs. Rhythm control today.  -with brief, paroxysmal events, cardioversion unlikely to be helpful at this time. Will start with rate control, may need rhythm control if symptoms persist.   -will start diltiazem 120 mg daily for rate control. Avoiding beta blocker with history of asthma  CV risk factors: Hypertension: continue HCTZ, losartan. Above goal today. Starting diltiazem for rate control, can monitor BP on this  Hyperlipidemia: borderline nubers. Lipids 06/2020 reviewed. No known ASCVD, LDL 116. ASCVD risk elevated, can discuss statin at follow up  OSA: continue CPAP  Morbid obesity: would be a good candidate for GLP1RA for weight loss    Cardiac risk counseling and prevention recommendations: -recommend heart healthy/Mediterranean diet, with whole grains, fruits, vegetable, fish, lean meats, nuts, and olive oil. Limit salt. -recommend moderate walking, 3-5 times/week for 30-50 minutes each session. Aim for at least 150 minutes.week. Goal should be pace of 3 miles/hours, or walking 1.5 miles in 30 minutes -recommend avoidance of tobacco products. Avoid excess alcohol. -ASCVD risk score: The 10-year ASCVD risk score (Arnett DK, et al., 2019) is: 13.9%   Values used to calculate the score:     Age: 32 years     Sex: Female     Is Non-Hispanic African American: No     Diabetic: No     Tobacco smoker: No     Systolic Blood Pressure: [id number] mmHg     Is BP treated: Yes     HDL Cholesterol: 39.3 mg/dL     Total Cholesterol: 174 mg/dL    Plan for follow up: 1 month  [name], MD, PhD, [facility]    Medication Adjustments/Labs and Tests Ordered: Current medicines are reviewed at length with the patient today.  Concerns regarding medicines are outlined above.  Orders Placed This  Encounter  Procedures   EKG 12-Lead    Meds ordered this encounter  Medications   apixaban (ELIQUIS) 5 MG TABS tablet    Sig: Take 1 tablet (5 mg total) by mouth 2 (two) times daily.    Dispense:  180 tablet    Refill:  3   diltiazem (CARDIZEM CD) 240 MG 24 hr capsule    Sig: Take 1 capsule (240 mg total) by mouth daily.    Dispense:  90 capsule    Refill:  3     Patient Instructions  Medication Instructions:  START ELIQUIS 5 MG TWICE A DAY   START DILTIAZEM 240 MG DAILY   *If you need a refill on your cardiac medications before your next appointment, please call your pharmacy*  Lab Work: NONE  Testing/Procedures: NONE  Follow-Up: At [facility], you and your health needs are our priority.  As part of our continuing mission to provide you with exceptional heart care, we have created designated Provider Care Teams.  These Care Teams include your primary Cardiologist (physician) and Advanced Practice Providers (APPs -  Physician Assistants and Nurse Practitioners) who all work together to provide you with the care you need, when you need it.  We recommend signing up for the patient portal called "MyChart".  Sign up information is provided on this After Visit Summary.  MyChart is used to connect with patients for Virtual Visits (Telemedicine).  Patients are able to view lab/test results, encounter notes, upcoming appointments, etc.  Non-urgent messages can be sent to your provider as well.   To learn more about what you can do with MyChart, go to [email or web address].    Your  next appointment:   4-6 week(s)  The format for your next appointment:   In Person  Provider:   [name], NP     Signed, [name], MD PhD 12/10/2020 1:02 PM    [place]

## 2020-12-11 ENCOUNTER — Telehealth: Payer: Self-pay | Admitting: Cardiology

## 2020-12-11 NOTE — Telephone Encounter (Signed)
Pt report she is unable to afford Eliquis as it would cost her $141 monthly. However pt state she has never applied for patient assistance program. Application placed up front for pick up. Pt will complete and return to office.  Nurse confirmed pt is not currently out of medication and will call if running low.

## 2020-12-11 NOTE — Telephone Encounter (Signed)
Patient called and stated that she needed help in regards to getting her Eliquis medication. Please call to discuss

## 2020-12-17 ENCOUNTER — Telehealth (HOSPITAL_BASED_OUTPATIENT_CLINIC_OR_DEPARTMENT_OTHER): Payer: Self-pay | Admitting: Cardiology

## 2020-12-17 NOTE — Telephone Encounter (Signed)
Spoke to patient she is filling out her part of Elquis patient assistance.Advised to enter her yearly income and bring back to office along with proof of income.She will bring back this week and give to Dr.Christopher's RN.

## 2020-12-17 NOTE — Telephone Encounter (Signed)
Patient is filling out the application for Eliquis. She had questions regarding the income section and what to put down. Please advise

## 2020-12-20 NOTE — Telephone Encounter (Signed)
Patient assistance application faxed to [place]  for Eliquis. Will await approval.

## 2020-12-24 ENCOUNTER — Telehealth (HOSPITAL_BASED_OUTPATIENT_CLINIC_OR_DEPARTMENT_OTHER): Payer: Self-pay | Admitting: Cardiology

## 2020-12-24 NOTE — Telephone Encounter (Signed)
Pt c/o medication issue:  1. Name of Medication: apixaban (ELIQUIS) 5 MG TABS tablet  2. How are you currently taking this medication (dosage and times per day)? Take 1 tablet (5 mg total) by mouth 2 (two) times daily  3. Are you having a reaction (difficulty breathing--STAT)? no  4. What is your medication issue? Patient states that she has heard anything back for her application that she put in to get assistant with the medication. She is seeing if she can get more samples until she gets approve. Please advise

## 2020-12-24 NOTE — Telephone Encounter (Signed)
Qualifies for eliquis 5mg  bid samples. Routing back to triage

## 2020-12-24 NOTE — Telephone Encounter (Signed)
Called and advised patient that CMA is placing samples at the front desk for her to pick up after confirming that samples are available with [facility]. Patient verbalized understanding and thanked me for the call.

## 2020-12-26 ENCOUNTER — Other Ambulatory Visit: Payer: Self-pay | Admitting: Obstetrics and Gynecology

## 2020-12-26 DIAGNOSIS — Z1231 Encounter for screening mammogram for malignant neoplasm of breast: Secondary | ICD-10-CM

## 2021-01-07 ENCOUNTER — Telehealth: Payer: Self-pay | Admitting: Cardiology

## 2021-01-07 NOTE — Telephone Encounter (Signed)
Pt is f/u on Paperwork for Eliquis

## 2021-01-07 NOTE — Telephone Encounter (Signed)
Returned call to patient who was calling in regard to her patient assistance paper work. Patient states that she dropped off the patient assistance forms for Eliquis on 9/19 but has not heard back. Advised patient I would forward message to Dr. Judeth Cornfield nurse to check on the status of them. Patient verbalized understanding.

## 2021-01-07 NOTE — Telephone Encounter (Signed)
Pt is requsting Eliquis Samples

## 2021-01-07 NOTE — Telephone Encounter (Signed)
Pt qualifies for eliquis 5mg  bid will route to dwb triage to place. Please try to find out the status of pt assistance paperwork the pt stated that they  filled it out and hadn't heard back from it and we can't continue to sample them forever because that is only a temporary solution to a permanent issue.

## 2021-01-07 NOTE — Telephone Encounter (Signed)
Lars Mage do you have any update on pt assistance?

## 2021-01-08 ENCOUNTER — Other Ambulatory Visit: Payer: Self-pay | Admitting: Family Medicine

## 2021-01-08 NOTE — Telephone Encounter (Addendum)
Nurse informed pt that we received a fax from  [facility] stating they did not receive provider portion. Nurse re-faxed application on 13/68. Pt also informed samples placed up front for pick up.  Eliquis 5 mg Qty: 2 boxes Lot # [id number] Exp: 7/24

## 2021-01-08 NOTE — Telephone Encounter (Signed)
Please see updated encounter  

## 2021-01-09 NOTE — Progress Notes (Signed)
Office Visit    Patient Name: Amy Stokes Date of Encounter: 01/10/2021  PCP:  Burnis Medin, MD   [place]  Cardiologist:  [name], MD  Advanced Practice Provider:  No care team member to display Electrophysiologist:  None      Chief Complaint    Amy Stokes is a 70 y.o. female with a hx of atrial fibrillation, HTN, asthma, OSA, obesity presents today for follow up of atrial fibrillation   Past Medical History    Past Medical History:  Diagnosis Date   Arthritis    no meds   Asthma    Edema    Fibroids    Hyperlipidemia    Hypertension    Obesity    Sleep apnea    wears cpap    Thyroid nodule 01/2009   left discovered on chest ct; needle bx "indeterminant" cannon r/o follicular neoplasm    Past Surgical History:  Procedure Laterality Date   ABDOMINAL HYSTERECTOMY Bilateral 08/02/2013   Procedure: TOTAL ABDOMINAL HYSTERECTOMY WITH BILATERAL SALPINGO-OOPHORECTOMY ;  Surgeon: Terrance Mass, MD;  Location: [facility];  Service: Gynecology;  Laterality: Bilateral;    Dr. Phineas Real to assist.  Dr. Zella Richer to follow with an umbilical hernia repair. He will need an additional 1 1/2 hours.    COLONOSCOPY  12/27/2007   [facility]    HERNIA REPAIR     MOUTH SURGERY     thyroid needle bx     TONSILLECTOMY     UMBILICAL HERNIA REPAIR N/A 08/02/2013   Procedure: HERNIA REPAIR UMBILICAL ADULT WITH MESH;  Surgeon: Odis Hollingshead, MD;  Location: [facility];  Service: General;  Laterality: N/A;    Allergies  No Known Allergies  History of Present Illness    Amy Stokes is a 70 y.o. female with a hx of atrial fibrillation, HTN, asthma, OSA, obesity last seen 12/10/20 by Dr. Harrell Gave.  Established September 2022 with Dr. Harrell Gave due to atrial fibrillation. She was compliant with her sleep apnea and wearing CPAP regularly. Symptoms were suggestive of parosyxmal atrial fibrillation and EKG confirmed atrial  fibrillation. She was started on Eliquis 5mg  BID as well as Diltiazem 120mg  QD. Beta blocker was avoided due to history of asthma.   She presents today for follow-up. Reports episodes of atrial fibrillation since last seen. She feels an uncomfortable ness with her breathing. Also notes her heart fluttering. Episodes are lasting up to a couple hours. Episodes are occurring every few days. Notes her O2 saturations decrease at night despite using her CPAP and sleeping on extra pillows.  Encouraged to discuss with pulmonology who manages her OSA as she may require nighttime oxygen.Feels dyspnea with exertion which is stable at her baseline. Endorses productive cough that is ongoing and unchanged. No chest pain, pressure, tightness, edema.  EKGs/Labs/Other Studies Reviewed:   The following studies were reviewed today:  Echo 11/01/20  1. Left ventricular ejection fraction, by estimation, is 55 to 60%. The  left ventricle has normal function. The left ventricle has no regional  wall motion abnormalities. Left ventricular diastolic parameters are  indeterminate.   2. Right ventricular systolic function is normal. The right ventricular  size is mildly enlarged. Tricuspid regurgitation signal is inadequate for  assessing PA pressure.   3. The mitral valve is grossly normal. No evidence of mitral valve  regurgitation. No evidence of mitral stenosis.   4. The aortic valve is grossly normal. Aortic valve regurgitation is not  visualized. No aortic stenosis is present.   5. The inferior vena cava is dilated in size with >50% respiratory  variability, suggesting right atrial pressure of 8 mmHg.   EKG:  No EKG today.   Recent Labs: 05/10/2020: ALT 25; Hemoglobin 13.6; Platelets 267 07/02/2020: BUN 14; Creatinine, Ser 0.64; Potassium 3.2; Sodium 139 10/12/2020: TSH 2.00  Recent Lipid Panel    Component Value Date/Time   CHOL 174 07/02/2020 0805   TRIG 95.0 07/02/2020 0805   HDL 39.30 07/02/2020 0805    CHOLHDL 4 07/02/2020 0805   VLDL 19.0 07/02/2020 0805   LDLCALC 116 (H) 07/02/2020 0805   LDLDIRECT 141.7 04/03/2011 1043    Risk Assessment/Calculations:   CHA2DS2-VASc Score = 3  This indicates a 3.2% annual risk of stroke. The patient's score is based upon: CHF History: 0 HTN History: 1 Diabetes History: 0 Stroke History: 0 Vascular Disease History: 0 Age Score: 1 Gender Score: 1    Home Medications   Current Meds  Medication Sig   acetaminophen (TYLENOL) 650 MG CR tablet Take 650 mg by mouth every 8 (eight) hours as needed for pain.   albuterol (PROAIR HFA) 108 (90 Base) MCG/ACT inhaler Inhale 2 puffs into the lungs every 6 (six) hours as needed for wheezing.   apixaban (ELIQUIS) 5 MG TABS tablet Take 1 tablet (5 mg total) by mouth 2 (two) times daily.   Cholecalciferol 50 MCG (2000 UT) CAPS Take 1 capsule by mouth daily. PATIENT USING OIL AND NOT CAPSULES   diltiazem (CARDIZEM CD) 240 MG 24 hr capsule Take 1 capsule (240 mg total) by mouth daily.   fluticasone-salmeterol (ADVAIR DISKUS) 250-50 MCG/ACT AEPB Inhale 1 puff into the lungs in the morning and at bedtime.   hydrochlorothiazide (HYDRODIURIL) 25 MG tablet TAKE 1 TABLET (25 MG TOTAL) BY MOUTH DAILY.   ketorolac (ACULAR) 0.5 % ophthalmic solution Place 1 drop into the right eye 2 (two) times daily.   KLOR-CON M10 10 MEQ tablet TAKE 2 TABLETS BY MOUTH DAILY   levothyroxine (SYNTHROID) 100 MCG tablet Take 100 mcg by mouth every morning.   loratadine (CLARITIN) 10 MG tablet Take 10 mg by mouth daily.   losartan (COZAAR) 100 MG tablet TAKE 1 TABLET BY MOUTH EVERY DAY   Lutein 10 MG TABS Take by mouth.   MULTIPLE VITAMIN PO Take 1 tablet by mouth daily.   Wheat Dextrin (BENEFIBER DRINK MIX PO) Take by mouth.     Review of Systems      All other systems reviewed and are otherwise negative except as noted above.  Physical Exam    VS:  BP (!) 166/58   Pulse 84   Ht 5\' 8"  (1.727 m)   Wt (!) 305 lb 1.6 oz (138.4 kg)    SpO2 97%   BMI 46.39 kg/m  , BMI Body mass index is 46.39 kg/m.  Wt Readings from Last 3 Encounters:  01/10/21 (!) 305 lb 1.6 oz (138.4 kg)  12/10/20 (!) 303 lb 8 oz (137.7 kg)  10/24/20 (!) 307 lb (139.3 kg)     GEN: Well nourished, well developed, in no acute distress. HEENT: normal. Neck: Supple, no JVD, carotid bruits, or masses. Cardiac: RRR, no murmurs, rubs, or gallops. No clubbing, cyanosis, edema.  Radials/PT 2+ and equal bilaterally.  Respiratory:  Respirations regular and unlabored, clear to auscultation bilaterally. GI: Soft, nontender, nondistended. MS: No deformity or atrophy. Skin: Warm and dry, no rash. Neuro:  Strength and sensation are intact. Psych: Normal  affect.  Assessment & Plan    PAF / Chronic anticoagulation -reports persistent intermittent episodes of atrial fibrillation which are symptomatic.  We will increase her diltiazem to 300 mg daily.  Avoid beta-blocker due to history of asthma.  Will refer to EP for discussion of possible antiarrhythmic agent.  She does wear her CPAP every evening and is working on weight loss.  CBC, CMP today for monitoring.  Tolerating Eliquis without bleeding complications. CHA2DS2-VASc Score = 3 [CHF History: 0, HTN History: 1, Diabetes History: 0, Stroke History: 0, Vascular Disease History: 0, Age Score: 1, Gender Score: 1].  Therefore, the patient's annual risk of stroke is 3.2 %.     HTN -BP elevated.  Home monitoring encouraged.  Increase diltiazem, as above.  Continue to monitor and consider additional agent as clinically indicated.  She will continue hydrochlorothiazide 25 mg daily, losartan 100 mg daily.  If needed could add hydralazine or transition to more potent ARB.  HLD -06/2020 LDL 116.  10-year ASCVD score 13.9% which is intermediate risk.  Discussed addition of statin and she is willing to consider.  Tells me she thinks she was on statin in the past with mild myalgias.  We discussed trialing rosuvastatin or  pravastatin as they are less likely to cause side effects.  We will update direct LDL today.  OSA - CPAP compliance encouraged. Endorses compliance though has hypoxia with readings in the 80s per her machine.  Encouraged to discuss with pulmonology as she may require nighttime oxygen.  Obesity - Weight loss via diet and exercise encouraged. Discussed the impact being overweight would have on cardiovascular risk. Would be a good GLP-1 candidate.  She does follow with endocrinology Dr. Chalmers Cater and plans to discuss with him. Discussed that we could prescribe if needed but prefers to discuss with endocrinology. She is very motivated to lose weight.  Disposition: Follow up  4-6 weeks  with Dr. Harrell Gave or APP.  Signed, Loel Dubonnet, NP 01/10/2021, 9:54 AM [place]

## 2021-01-10 ENCOUNTER — Ambulatory Visit (HOSPITAL_BASED_OUTPATIENT_CLINIC_OR_DEPARTMENT_OTHER): Payer: Medicare HMO | Admitting: Family

## 2021-01-10 ENCOUNTER — Encounter (HOSPITAL_BASED_OUTPATIENT_CLINIC_OR_DEPARTMENT_OTHER): Payer: Self-pay | Admitting: Family

## 2021-01-10 ENCOUNTER — Other Ambulatory Visit: Payer: Self-pay

## 2021-01-10 VITALS — BP 166/58 | HR 84 | Ht 68.0 in | Wt 305.1 lb

## 2021-01-10 DIAGNOSIS — E782 Mixed hyperlipidemia: Secondary | ICD-10-CM | POA: Diagnosis not present

## 2021-01-10 DIAGNOSIS — I480 Paroxysmal atrial fibrillation: Secondary | ICD-10-CM

## 2021-01-10 DIAGNOSIS — G4733 Obstructive sleep apnea (adult) (pediatric): Secondary | ICD-10-CM

## 2021-01-10 DIAGNOSIS — Z7901 Long term (current) use of anticoagulants: Secondary | ICD-10-CM | POA: Diagnosis not present

## 2021-01-10 DIAGNOSIS — I10 Essential (primary) hypertension: Secondary | ICD-10-CM | POA: Diagnosis not present

## 2021-01-10 DIAGNOSIS — D6869 Other thrombophilia: Secondary | ICD-10-CM | POA: Diagnosis not present

## 2021-01-10 MED ORDER — DILTIAZEM HCL ER COATED BEADS 300 MG PO CP24: 300.0000 mg | ORAL_CAPSULE | Freq: Every day | ORAL | 2 refills | Status: DC

## 2021-01-10 NOTE — Patient Instructions (Signed)
Medication Instructions:   Your physician has recommended you make the following change in your medication:   CHANGE Diltiazem to one 300mg  tablet daily   *If you need a refill on your cardiac medications before your next appointment, please call your pharmacy*   Lab Work: Your physician recommends that you return for lab work today CBC, CMP, direct LDL  If you have labs (blood work) drawn today and your tests are completely normal, you will receive your results only by: [place] (if you have [place]) OR A paper copy in the mail If you have any lab test that is abnormal or we need to change your treatment, we will call you to review the results.   Testing/Procedures: None ordered today.   Follow-Up: At [facility], you and your health needs are our priority.  As part of our continuing mission to provide you with exceptional heart care, we have created designated Provider Care Teams.  These Care Teams include your primary Cardiologist (physician) and Advanced Practice Providers (APPs -  Physician Assistants and Nurse Practitioners) who all work together to provide you with the care you need, when you need it.  We recommend signing up for the patient portal called "MyChart".  Sign up information is provided on this After Visit Summary.  MyChart is used to connect with patients for Virtual Visits (Telemedicine).  Patients are able to view lab/test results, encounter notes, upcoming appointments, etc.  Non-urgent messages can be sent to your provider as well.   To learn more about what you can do with MyChart, go to [email or web address].    Your next appointment:   4-6 week(s)  The format for your next appointment:   In Person  Provider:   [name], MD or Loel Dubonnet, NP    Other Instructions  Because of your cardiovascular risk it would be worthwhile to take a low dose cholesterol medication. We would trial Pravastatin or Rosuvastatin as they are  least likely to cause muscle cramps or pain. We will ensure you are tolerating your increased dose Diltiazem and can consider adding after that.   For weight loss, recommend discussing a medication called Semaglutide [facility]) with your endocrinologist as it is a once per week injection to facilitate weight loss. If you would like for [place] to prescribe, please let us know.  Heart Healthy Diet Recommendations: A low-salt diet is recommended. Meats should be grilled, baked, or boiled. Avoid fried foods. Focus on lean protein sources like fish or chicken with vegetables and fruits. The American Heart Association is a Microbiologist!    Exercise recommendations: The American Heart Association recommends 150 minutes of moderate intensity exercise weekly. Try 30 minutes of moderate intensity exercise 4-5 times per week. This could include walking, jogging, or swimming.

## 2021-01-11 LAB — COMPREHENSIVE METABOLIC PANEL
ALT: 13 IU/L (ref 0–32)
AST: 13 IU/L (ref 0–40)
Albumin/Globulin Ratio: 1.4 (ref 1.2–2.2)
Albumin: 3.8 g/dL (ref 3.8–4.8)
Alkaline Phosphatase: 91 IU/L (ref 44–121)
BUN/Creatinine Ratio: 16 (ref 12–28)
BUN: 12 mg/dL (ref 8–27)
Bilirubin Total: 0.4 mg/dL (ref 0.0–1.2)
CO2: 31 mmol/L — ABNORMAL HIGH (ref 20–29)
Calcium: 9 mg/dL (ref 8.7–10.3)
Chloride: 95 mmol/L — ABNORMAL LOW (ref 96–106)
Creatinine, Ser: 0.74 mg/dL (ref 0.57–1.00)
Globulin, Total: 2.8 g/dL (ref 1.5–4.5)
Glucose: 129 mg/dL — ABNORMAL HIGH (ref 70–99)
Potassium: 3.7 mmol/L (ref 3.5–5.2)
Sodium: 140 mmol/L (ref 134–144)
Total Protein: 6.6 g/dL (ref 6.0–8.5)
eGFR: 88 mL/min/{1.73_m2} (ref >59)

## 2021-01-11 LAB — CBC
Hematocrit: 39.7 % (ref 34.0–46.6)
Hemoglobin: 12.8 g/dL (ref 11.1–15.9)
MCH: 27.1 pg (ref 26.6–33.0)
MCHC: 32.2 g/dL (ref 31.5–35.7)
MCV: 84 fL (ref 79–97)
Platelets: 266 10*3/uL (ref 150–450)
RBC: 4.73 x10E6/uL (ref 3.77–5.28)
RDW: 13.3 % (ref 11.7–15.4)
WBC: 5.9 10*3/uL (ref 3.4–10.8)

## 2021-01-11 LAB — LDL CHOLESTEROL, DIRECT: LDL Direct: 112 mg/dL — ABNORMAL HIGH (ref 0–99)

## 2021-01-15 NOTE — Telephone Encounter (Signed)
Contacted [employer] to check on status. Was informed pt filled out old application and will need to complete new forms.   Pt made aware and will drop of completed application to nurse today.

## 2021-01-15 NOTE — Telephone Encounter (Signed)
Pt is following up on Eliquis Assistance paperwork she completed on 12/17/20. Pt received the same paperwork that she previously completed in the mail yesterday, she wants to know if she should complete the paperwork out again? Please advise pt further

## 2021-01-15 NOTE — Telephone Encounter (Signed)
New patient assistance application faxed to [facility].

## 2021-01-18 ENCOUNTER — Encounter (HOSPITAL_BASED_OUTPATIENT_CLINIC_OR_DEPARTMENT_OTHER): Payer: Self-pay

## 2021-01-21 NOTE — Telephone Encounter (Signed)
Called pt and notified she has been approved up until 03/30/21.

## 2021-01-22 ENCOUNTER — Other Ambulatory Visit: Payer: Medicare HMO

## 2021-01-23 ENCOUNTER — Ambulatory Visit
Admission: RE | Admit: 2021-01-23 | Discharge: 2021-01-23 | Disposition: A | Discharge status: Home / Self Care | Payer: Medicare HMO | Source: Ambulatory Visit | Attending: Obstetrics and Gynecology | Admitting: Obstetrics and Gynecology

## 2021-01-23 ENCOUNTER — Other Ambulatory Visit: Payer: Self-pay

## 2021-01-23 DIAGNOSIS — Z1231 Encounter for screening mammogram for malignant neoplasm of breast: Secondary | ICD-10-CM | POA: Diagnosis not present

## 2021-02-04 ENCOUNTER — Telehealth: Payer: Self-pay | Admitting: Pharmacist

## 2021-02-04 ENCOUNTER — Other Ambulatory Visit: Payer: Self-pay | Admitting: Internal Medicine

## 2021-02-04 NOTE — Chronic Care Management (AMB) (Signed)
    Chronic Care Management Pharmacy Assistant   Name: Amy Stokes  MRN: [id number] DOB: [birth year]  02/05/21 APPOINTMENT REMINDER   [name] was reminded to have all medications, supplements and any blood glucose and blood pressure readings available for review with [name], Pharm. D, at her telephone visit on 02/05/21 at 4pm.   Questions: Have you had any recent office visit or specialist visit outside of [place]? No.  Are there any concerns you would like to discuss during your office visit? Not at this time.   Are you having any problems obtaining your medications? No issues currently.  If patient has any PAP medications ask if they are having any problems getting their PAP medication or refill? No issues with her Eliquis patient assistance.   Care Gaps:  AWV - scheduled for 09/10/21 PAP smear modifier - overdue since 2017 Last PCP BP: 128/84 P: 81  Star Rating Drug:  Losartan 100mg  - last filled on 01/08/21 90DS at [facility]   Any gaps in medications fill history? No.  [place]  Clinical Pharmacist Assistant [phone]

## 2021-02-05 ENCOUNTER — Ambulatory Visit (INDEPENDENT_AMBULATORY_CARE_PROVIDER_SITE_OTHER): Payer: Medicare HMO | Admitting: Pharmacist

## 2021-02-05 DIAGNOSIS — I10 Essential (primary) hypertension: Secondary | ICD-10-CM

## 2021-02-05 DIAGNOSIS — E782 Mixed hyperlipidemia: Secondary | ICD-10-CM

## 2021-02-05 NOTE — Progress Notes (Signed)
Chronic Care Management Pharmacy Note  02/18/2021 Name:  Amy Stokes MRN:  [id number] DOB:  05-Feb-1951  Summary: BP not ideally at goal < 130/80 and higher in the morning   Recommendations/Changes made from today's visit: -Recommended moving losartan to evening with second dose of Eliquis -Recommended bringing BP cuff to office visit to ensure accuracy -Recommended moderate intensity statin therapy per moderate ASCVD risk   Plan: Follow up BP assessment in 2 months  Subjective: Amy Stokes is an 70 y.o. year old female who is a primary patient of Amy Stokes, Amy Brooking, Amy Stokes.  The CCM team was consulted for assistance with disease management and care coordination needs.    Engaged with patient by telephone for follow up visit in response to provider referral for pharmacy case management and/or care coordination services.   Consent to Services:  The patient was given information about Chronic Care Management services, agreed to services, and gave verbal consent prior to initiation of services.  Please see initial visit note for detailed documentation.   Patient Care Team: Amy Stokes, Amy Brooking, Amy Stokes as PCP - General Amy Dresser, Amy Stokes as PCP - Cardiology (Cardiology) [name], Amy Stokes as Consulting Physician (Endocrinology) [name], Amy Stokes as Consulting Physician (Pulmonary Disease) Amy Pearson, Amy Stokes as Consulting Physician (Obstetrics and Gynecology) [name], [facility] as Pharmacist (Pharmacist)  Recent office visits: 10/24/20 Amy Ace, Amy Stokes: Patient presented for Afib. Referred to cardiology and recommended restarting aspirin. A1c 5.7%.   08/28/20 Amy Rakes, LPN: Patient presented for AWV.  Recent consult visits: 01/10/21 Amy Loron, NP (cardiology): Patient presented for Afib follow up. BP elevated in office. Increased diltiazem to 300 mg daily. Recommend statin.  12/10/20 Amy Dresser, Amy Stokes (cardiology): Patient presented for Afib initial  visit. Stopped aspirin and prescribed diltiazem 120 mg and Eliquis 5 mg BID.  11/16/20 Amy Fam, Amy Stokes (ophthalomology): Patient presented for follow up. Unable to access notes.  11/02/20 Amy Stokes (endo): Patient presented for goiter and hypothyroidism follow up. Unable to access notes.  08/22/20 Amy Rist, Amy Stokes (pulmonary): Patient presented for OSA follow up.  02/28/20 Amy Stokes (women's health): Patient presented for follow up. Unable to access notes.  Hospital visits: 05/10/20 Patient presented to the ED with flank pain and a kidney stone.  Objective:  Lab Results  Component Value Date   CREATININE 0.74 01/10/2021   BUN 12 01/10/2021   GFR 90.27 07/02/2020   GFRNONAA >60 05/10/2020   GFRAA 95 05/10/2008   NA 140 01/10/2021   K 3.7 01/10/2021   CALCIUM 9.0 01/10/2021   CO2 31 (H) 01/10/2021   GLUCOSE 129 (H) 01/10/2021    Lab Results  Component Value Date/Time   HGBA1C 5.7 (A) 10/24/2020 12:35 PM   HGBA1C 6.0 06/10/2019 10:16 AM   HGBA1C 6.0 11/04/2017 11:56 AM   GFR 90.27 07/02/2020 08:05 AM   GFR 101.30 06/10/2019 10:16 AM    Last diabetic Eye exam: No results found for: HMDIABEYEEXA  Last diabetic Foot exam: No results found for: HMDIABFOOTEX   Lab Results  Component Value Date   CHOL 174 07/02/2020   HDL 39.30 07/02/2020   LDLCALC 116 (H) 07/02/2020   LDLDIRECT 112 (H) 01/10/2021   TRIG 95.0 07/02/2020   CHOLHDL 4 07/02/2020    Hepatic Function Latest Ref Rng & Units 01/10/2021 05/10/2020 06/10/2019  Total Protein 6.0 - 8.5 g/dL 6.6 7.2 6.8  Albumin 3.8 - 4.8 g/dL 3.8 3.3(L) 3.6  AST 0 - 40 IU/L _0 ALT  0 - 32 IU/L _0 Alk Phosphatase 44 - 121 IU/L 91 89 101  Total Bilirubin 0.0 - 1.2 mg/dL 0.4 0.6 0.5  Bilirubin, Direct 0.0 - 0.2 mg/dL - <0.1 0.1    Lab Results  Component Value Date/Time   TSH 2.00 10/12/2020 12:00 AM   TSH 2.21 06/10/2019 10:16 AM   TSH 1.95 11/04/2017 11:56 AM   FREET4 0.82 04/19/2015 09:47 AM    FREET4 1.0 07/16/2009 03:14 PM    CBC Latest Ref Rng & Units 01/10/2021 05/10/2020 06/10/2019  WBC 3.4 - 10.8 x10E3/uL 5.9 9.8 7.6  Hemoglobin 11.1 - 15.9 g/dL 12.8 13.6 13.2  Hematocrit 34.0 - 46.6 % 39.7 43.1 40.3  Platelets 150 - 450 x10E3/uL 266 267 257.0    Lab Results  Component Value Date/Time   VD25OH 38.91 04/19/2015 09:47 AM    CHA2DS2/VAS Stroke Risk Points  Current as of 5 hours ago     3 >= 2 Points: High Risk  1 - 1.99 Points: Medium Risk  0 Points: Low Risk    No Change      Details    This score determines the patient's risk of having a stroke if the  patient has atrial fibrillation.       Points Metrics  0 Has Congestive Heart Failure:  No    Current as of 5 hours ago  0 Has Vascular Disease:  No    Current as of 5 hours ago  1 Has Hypertension:  Yes    Current as of 5 hours ago  1 Age:  79    Current as of 5 hours ago  0 Has Diabetes:  No    Current as of 5 hours ago  0 Had Stroke:  No  Had TIA:  No  Had Thromboembolism:  No    Current as of 5 hours ago  1 Female:  Yes    Current as of 5 hours ago          Clinical ASCVD: No  The 10-year ASCVD risk score (Arnett DK, et al., 2019) is: 11%   Values used to calculate the score:     Age: 70 years     Sex: Female     Is Non-Hispanic African American: No     Diabetic: No     Tobacco smoker: No     Systolic Blood Pressure: 161 mmHg     Is BP treated: Yes     HDL Cholesterol: 39.3 mg/dL     Total Cholesterol: 174 mg/dL    Depression screen [facility] 2/9 08/28/2020 05/14/2020 06/10/2019  Decreased Interest 0 0 0  Down, Depressed, Hopeless 0 0 0  PHQ - 2 Score 0 0 0      Social History   Tobacco Use  Smoking Status Former   Packs/day: 1.00   Years: 1.00   Pack years: 1.00   Types: Cigarettes   Quit date: 04/29/1971   Years since quitting: 49.8  Smokeless Tobacco Never   BP Readings from Last 3 Encounters:  02/18/21 124/72  02/07/21 124/80  01/10/21 (!) 166/58   Pulse Readings from Last 3  Encounters:  02/18/21 71  02/07/21 86  01/10/21 84   Wt Readings from Last 3 Encounters:  02/18/21 (!) 304 lb 11.2 oz (138.2 kg)  02/07/21 300 lb (136.1 kg)  01/10/21 (!) 305 lb 1.6 oz (138.4 kg)   BMI Readings from Last 3 Encounters:  02/18/21 46.33 kg/m  02/07/21 45.61 kg/m  01/10/21 46.39 kg/m    Assessment/Interventions: Review of patient past medical history, allergies, medications, health status, including review of consultants reports, laboratory and other test data, was performed as part of comprehensive evaluation and provision of chronic care management services.   SDOH:  (Social Determinants of Health) assessments and interventions performed: No  SDOH Screenings   Alcohol Screen: Not on file  Depression (PHQ2-9): Low Risk    PHQ-2 Score: 0  Financial Resource Strain: Low Risk    Difficulty of Paying Living Expenses: Not hard at all  Food Insecurity: No Food Insecurity   Worried About Charity fundraiser in the Last Year: Never true   Ran Out of Food in the Last Year: Never true  Housing: Low Risk    Last Housing Risk Score: 0  Physical Activity: Sufficiently Active   Days of Exercise per Week: 3 days   Minutes of Exercise per Session: 90 min  Social Connections: Moderately Integrated   Frequency of Communication with Friends and Family: More than three times a week   Frequency of Social Gatherings with Friends and Family: Three times a week   Attends Religious Services: Never   Active Member of Clubs or Organizations: Yes   Attends Archivist Meetings: 1 to 4 times per year   Marital Status: Married  Stress: No Stress Concern Present   Feeling of Stress : Not at all  Tobacco Use: Medium Risk   Smoking Tobacco Use: Former   Smokeless Tobacco Use: Never   Passive Exposure: Not on file  Transportation Needs: No Transportation Needs   Lack of Transportation (Medical): No   Lack of Transportation (Non-Medical): No    CCM Care Plan  No Known  Allergies  Medications Reviewed Today     Reviewed by Amy Dresser, Amy Stokes (Physician) on 02/18/21 at [place]  Med List Status: <None>   Medication Order Taking? Sig Documenting Provider Last Dose Status Informant  acetaminophen (TYLENOL) 650 MG CR tablet [id number] Yes Take 650 mg by mouth every 8 (eight) hours as needed for pain. Provider, Historical, Amy Stokes Taking Active   albuterol (PROAIR HFA) 108 (90 Base) MCG/ACT inhaler [id number] Yes Inhale 2 puffs into the lungs every 6 (six) hours as needed for wheezing. Amy Stokes, Amy Brooking, Amy Stokes Taking Active   apixaban (ELIQUIS) 5 MG TABS tablet [id number] Yes Take 1 tablet (5 mg total) by mouth 2 (two) times daily. Amy Dresser, Amy Stokes Taking Active   Cholecalciferol 50 MCG [phone] UT) CAPS [id number] Yes Take 1 capsule by mouth daily. PATIENT USING OIL AND NOT CAPSULES Provider, Historical, Amy Stokes Taking Active   diltiazem (CARDIZEM CD) 300 MG 24 hr capsule [id number] Yes Take 1 capsule (300 mg total) by mouth daily. Loel Dubonnet, NP Taking Active   flecainide (TAMBOCOR) 50 MG tablet [id number] Yes Take 1 tablet (50 mg total) by mouth 2 (two) times daily. [name], Amy Stokes Taking Active   fluticasone-salmeterol (ADVAIR DISKUS) 250-50 MCG/ACT AEPB [id number] Yes Inhale 1 puff into the lungs in the morning and at bedtime. [name], Amy Stokes Taking Active   hydrochlorothiazide (HYDRODIURIL) 25 MG tablet [id number] Yes TAKE 1 TABLET (25 MG TOTAL) BY MOUTH DAILY. Laurey Morale, Amy Stokes Taking Active   KLOR-CON M10 10 MEQ tablet [id number] Yes TAKE 2 TABLETS BY MOUTH DAILY Amy Stokes, Amy Brooking, Amy Stokes Taking Active   levothyroxine (SYNTHROID) 100 MCG tablet [id number] Yes Take 100 mcg by mouth every morning. Provider, Historical, Amy Stokes Taking Active   losartan (COZAAR) 100 MG tablet  [id number] Yes TAKE 1 TABLET BY MOUTH EVERY DAY Amy Stokes, Amy Brooking, Amy Stokes Taking Active   Lutein 10 MG TABS [id number] Yes Take by mouth. Provider, Historical, Amy Stokes Taking Active   MULTIPLE VITAMIN PO  [id number] Yes Take 1 tablet by mouth daily. Provider, Historical, Amy Stokes Taking Active   Wheat Dextrin (BENEFIBER DRINK [place]) [id number] Yes Take by mouth. Provider, Historical, Amy Stokes Taking Active             Patient Active Problem List   Diagnosis Date Noted   Cough 08/09/2015   Iliotibial band syndrome affecting left lower leg 05/26/2014   Arthritis of left lower extremity 05/26/2014   Low HDL (under 40) 04/08/2014   Thyroid nodule 02/12/2014   OSA (obstructive sleep apnea) 16/03/930   Seroma complicating a procedure 08/18/2013   Bleeding gums 06/09/2011   ALLERGY 01/31/2010   MORBID OBESITY 07/16/2009   Multinodular goiter (nontoxic) 01/13/2008   RECTAL BLEEDING 11/22/2007   UNS ADVRS EFF UNS RX MEDICINAL&BIOLOGICAL SBSTNC 07/26/2007   HYPERLIPIDEMIA 04/06/2007   HYPERTENSION 03/05/2007   TACHYCARDIA 03/05/2007   Asthma 12/28/2006    Immunization History  Administered Date(s) Administered   Fluad Quad(high Dose 65+) 12/07/2019, 01/08/2021   Influenza Split 11/04/2012, 01/04/2014   Influenza, High Dose Seasonal PF 11/06/2016, 01/26/2018, 02/16/2019   Influenza,inj,Quad PF,6+ Mos 04/19/2015, 12/27/2015   Influenza-Unspecified 11/06/2016, 12/07/2019   PFIZER Comirnaty(Gray Top)Covid-19 Tri-Sucrose Vaccine 07/10/2020   PFIZER(Purple Top)SARS-COV-2 Vaccination 11/18/2018, 12/15/2018, 11/03/2019   Pfizer Covid-19 Vaccine Bivalent Booster 62yr & up 01/08/2021   Pneumococcal Conjugate-13 04/03/2016   Pneumococcal Polysaccharide-23 01/31/2010, 04/11/2020   Tdap 07/28/2013   Zoster Recombinat (Shingrix) 06/13/2017, 09/27/2017   Patient has been charting her blood pressure and pulse just about every day. She said her pulse has been better controlled with the increase in diltiazem, which she pays attention with her fit bit. Patient gets winded a lot quicker.  Conditions to be addressed/monitored:  Hypertension, Hyperlipidemia, GERD, Asthma, Hypothyroidism, Allergic Rhinitis and  Jaw pain  Conditions addressed this visit: Hypertension, hyperlipidemia  Care Plan : CCM Pharmacy Care Plan  Updates made by PViona Gilmore RLeamingtonsince 02/18/2021 12:00 AM     Problem: Problem: Hypertension, Hyperlipidemia, GERD, Asthma, Hypothyroidism, Allergic Rhinitis and Jaw pain      Long-Range Goal: Patient-Specific Goal   Start Date: 08/07/2020  Expected End Date: 08/07/2021  Recent Progress: On track  Priority: High  Note:   Current Barriers:  Unable to independently monitor therapeutic efficacy Unable to achieve control of blood pressure   Pharmacist Clinical Goal(s):  Patient will achieve adherence to monitoring guidelines and medication adherence to achieve therapeutic efficacy achieve control of blood pressure as evidenced by home blood pressure readings  through collaboration with PharmD and provider.   Interventions: 1:1 collaboration with Amy Stokes, WStandley Brooking Amy Stokes regarding development and update of comprehensive plan of care as evidenced by provider attestation and co-signature Inter-disciplinary care team collaboration (see longitudinal plan of care) Comprehensive medication review performed; medication list updated in electronic medical record  Hypertension (BP goal <140/90) -Uncontrolled -Current treatment: Losartan 100 mg 1 tablet daily - in PM (2:30-4pm) Hydrochlorothiazide 25 mg 1 capsule daily - in AM  Diltiazem 300 mg 1 capsule daily - in AM Potassium daily -Medications previously tried: none -Current home readings: 138/65 HR 76, 116/62, 138/64 HR 75, 140/60 HR 77, 157/73 HR 77 (usually around 130s) -Current dietary habits: did not discuss -Current exercise habits: patient joined [employer] is going 3 days a week (cross trainer x  15 minutes, weight bearing for arms & legs, core exercises) -Denies hypotensive/hypertensive symptoms -Educated on Exercise goal of 150 minutes per week; Importance of home blood pressure monitoring; Proper BP monitoring  technique; -Counseled to monitor BP at home twice weekly, document, and provide log at future appointments -Counseled on diet and exercise extensively Recommended to continue current medication Recommended for patient to move losartan to evening time to provide better all day blood pressure coverage  Hyperlipidemia: (LDL goal < 100) -Uncontrolled -Current treatment: No medications -Medications previously tried: none (records indicate Lipitor and Mevacor but patient denies ever taking these)   -Current dietary patterns: got an air fryer; a lot less cooking oil; eating fish more often; trying to eat less overall; cut out fried foods and no red meat -Current exercise habits: patient joined [employer] and is going 3 days a week (cross trainer x 15 minutes, weight bearing for arms & legs, core exercises), water yoga on Wednesdays -Educated on Cholesterol goals;  Benefits of statin for ASCVD risk reduction; Importance of limiting foods high in cholesterol; Exercise goal of 150 minutes per week; -Counseled on diet and exercise extensively  Atrial Fibrillation (Goal: prevent stroke and major bleeding) -Controlled -CHADSVASC: 3 -Current treatment: Rate control: Diltiazem 300 mg 1 capsule daily Anticoagulation: Eliquis 5 mg 1 tablet twice daily -Medications previously tried: none -Home BP and HR readings: refer to above  -Counseled on increased risk of stroke due to Afib and benefits of anticoagulation for stroke prevention; bleeding risk associated with Eliquis and importance of self-monitoring for signs/symptoms of bleeding; avoidance of NSAIDs due to increased bleeding risk with anticoagulants; -Assessed patient finances. Patient was approved for Eliquis PAP.  Asthma (Goal: control symptoms) -Controlled -Current treatment  Advair 100-50 mcg/dose 1 puff twice daily  Albuterol HFA 1 puff every 6 hours as needed -Medications previously tried: none  -Pulmonary function testing: n/a -Patient  reports consistent use of maintenance inhaler -Frequency of rescue inhaler use: a couple of times within the past few weeks -Counseled on Benefits of consistent maintenance inhaler use When to use rescue inhaler -Recommended to continue current medication Requested a refill for Advair inhaler  Hypothyroidism (Goal: TSH 0.35-4.5) -Controlled -Current treatment  Levothyroxine 100 mcg 1 tablet daily -Medications previously tried: none  -Recommended to continue current medication  GERD (Goal: minimize symptoms) -Controlled -Current treatment  No medications -Medications previously tried: omeprazole (no longer needed)  -Recommended to continue as is  Allergic rhinitis (Goal: minimize symptoms) -Controlled -Current treatment  Claritin 10 mg 1 tablet daily -Medications previously tried: none  -Counseled on avoidance of triggers (dust)  Jaw pain (Goal: minimize pain) -Controlled -Current treatment  Acetaminophen 650 mg 2 tablet as needed (before bed for jaw pain) -Medications previously tried: none  -Recommended to continue current medication Counseled on maximum recommended dose of 3,000 mg/day of tylenol; discussed that Tylenol is found in many over the counter products (especially cough and cold medicine)    Health Maintenance -Vaccine gaps: none -Current therapy:  Multivitamin 1 tablet daily Vitamin D 2000 units daily Optifiber/benefiber daily -Educated on Cost vs benefit of each product must be carefully weighed by individual consumer -Patient is satisfied with current therapy and denies issues -Recommended to continue current medication Counseled on avoidance of supplementation with vitamin C due to recent kidney stone   Patient Goals/Self-Care Activities Patient will:  - take medications as prescribed check blood pressure twice weekly, document, and provide at future appointments target a minimum of 150 minutes of moderate intensity exercise weekly  Follow Up  Plan: Telephone follow up appointment with care management team member scheduled for: 6 months       Medication Assistance: None required.  Patient affirms current coverage meets needs.  Compliance/Adherence/Medication fill history: Care Gaps: PAP smear BP- 128/84  Star-Rating Drugs: Losartan 193m - last [facility] on 01/08/21 [facility]DS [facility]  Patient[facility]  [id number] [id number]: [phone]: [phone] pill box? Yes Pt endorses 99% compliance  We discussed: Current pharmacy is preferred with insurance plan and patient is satisfied with pharmacy services Patient decided to: Continue current medication management strategy  Care Plan and Follow Up Patient Decision:  Patient agrees to Care Plan and Follow-up.  Plan: Telephone follow up appointment with care management team member scheduled for:  6 months  MaJeni SallesPharmD BCNorth DeLandharmacist [place]iver[place] [phone]

## 2021-02-07 ENCOUNTER — Encounter: Payer: Self-pay | Admitting: Cardiology

## 2021-02-07 ENCOUNTER — Other Ambulatory Visit: Payer: Self-pay

## 2021-02-07 ENCOUNTER — Ambulatory Visit: Payer: Medicare HMO | Admitting: Cardiology

## 2021-02-07 VITALS — BP 124/80 | HR 86 | Ht 68.0 in | Wt 300.0 lb

## 2021-02-07 DIAGNOSIS — I480 Paroxysmal atrial fibrillation: Secondary | ICD-10-CM | POA: Diagnosis not present

## 2021-02-07 MED ORDER — FLECAINIDE ACETATE 50 MG PO TABS: 50.0000 mg | ORAL_TABLET | Freq: Two times a day (BID) | ORAL | 3 refills | Status: DC

## 2021-02-07 NOTE — Patient Instructions (Addendum)
Medication Instructions:  Your physician has recommended you make the following change in your medication:  START Flecainide 50 mg TWICE a day  *If you need a refill on your cardiac medications before your next appointment, please call your pharmacy*   Lab Work: None ordered   Testing/Procedures: None ordered   Follow-Up: At [facility], you and your health needs are our priority.  As part of our continuing mission to provide you with exceptional heart care, we have created designated Provider Care Teams.  These Care Teams include your primary Cardiologist (physician) and Advanced Practice Providers (APPs -  Physician Assistants and Nurse Practitioners) who all work together to provide you with the care you need, when you need it.   Your next appointment:   3 month(s)  The format for your next appointment:   In Person  Provider:   Allegra Lai, MD  You have been referred to weight loss clinic    Thank you for choosing [facility]!!   Trinidad Curet, RN [phone]   Other Instructions Flecainide Tablets What is this medication? FLECAINIDE (FLEK a nide) prevents and treats a fast or irregular heartbeat (arrhythmia). It is often used to treat a type of arrhythmia known as AFib (atrial fibrillation). It works by slowing down overactive electric signals in the heart, which stabilizes your heart rhythm. It belongs to a group of medications called antiarrhythmics. This medicine may be used for other purposes; ask your health care provider or pharmacist if you have questions. COMMON BRAND NAME(S): Tambocor What should I tell my care team before I take this medication? They need to know if you have any of these conditions: Abnormal levels of potassium in the blood Heart disease including heart rhythm and heart rate problems Kidney or liver disease Recent heart attack An unusual or allergic reaction to flecainide, local anesthetics, other medications, foods, dyes, or  preservatives Pregnant or trying to get pregnant Breast-feeding How should I use this medication? Take this medication by mouth with a glass of water. Follow the directions on the prescription label. You can take this medication with or without food. Take your doses at regular intervals. Do not take your medication more often than directed. Do not stop taking this medication suddenly. This may cause serious, heart-related side effects. If your care team wants you to stop the medication, the dose may be slowly lowered over time to avoid any side effects. Talk to your care team regarding the use of this medication in children. While this medication may be prescribed for children as young as 1 year of age for selected conditions, precautions do apply. Overdosage: If you think you have taken too much of this medicine contact a poison control center or emergency room at once. NOTE: This medicine is only for you. Do not share this medicine with others. What if I miss a dose? If you miss a dose, take it as soon as you can. If it is almost time for your next dose, take only that dose. Do not take double or extra doses. What may interact with this medication? Do not take this medication with any of the following: Amoxapine Arsenic trioxide Certain antibiotics like clarithromycin, erythromycin, gatifloxacin, gemifloxacin, levofloxacin, moxifloxacin, sparfloxacin, or troleandomycin Certain antidepressants called tricyclic antidepressants like amitriptyline, imipramine, or nortriptyline Certain medications to control heart rhythm like disopyramide, encainide, moricizine, procainamide, propafenone, and quinidine Cisapride Delavirdine Droperidol Haloperidol [place]orn Imatinib Levomethadyl Maprotiline Medications for malaria like chloroquine and halofantrine Pentamidine Phenothiazines like chlorpromazine, mesoridazine, prochlorperazine,  thioridazine Pimozide Quinine Ranolazine Ritonavir Sertindole This medication may also interact with the following: Cimetidine Dofetilide Medications for angina or high blood pressure Medications to control heart rhythm like amiodarone and digoxin Ziprasidone This list may not describe all possible interactions. Give your health care provider a list of all the medicines, herbs, non-prescription drugs, or dietary supplements you use. Also tell them if you smoke, drink alcohol, or use illegal drugs. Some items may interact with your medicine. What should I watch for while using this medication? Visit your care team for regular checks on your progress. Because your condition and the use of this medication carries some risk, it is a good idea to carry an identification card, necklace or bracelet with details of your condition, medications, and care team. Check your blood pressure and pulse rate regularly. Ask your care team what your blood pressure and pulse rate should be, and when you should contact them. Your care team also may schedule regular blood tests and electrocardiograms to check your progress. You may get drowsy or dizzy. Do not drive, use machinery, or do anything that needs mental alertness until you know how this medication affects you. Do not stand or sit up quickly, especially if you are an older patient. This reduces the risk of dizzy or fainting spells. Alcohol can make you more dizzy, increase flushing and rapid heartbeats. Avoid alcoholic drinks. What side effects may I notice from receiving this medication? Side effects that you should report to your care team as soon as possible: Allergic reactions--skin rash, itching, hives, swelling of the face, lips, tongue, or throat Heart failure--shortness of breath, swelling of the ankles, feet, or hands, sudden weight gain, unusual weakness or fatigue Heart rhythm changes--fast or irregular heartbeat, dizziness, feeling faint or  lightheaded, chest pain, trouble breathing Liver injury--right upper belly pain, loss of appetite, nausea, light-colored stool, dark yellow or brown urine, yellowing skin or eyes, unusual weakness or fatigue Side effects that usually do not require medical attention (report to your care team if they continue or are bothersome): Blurry vision Constipation Dizziness Fatigue Headache Nausea Tremors or shaking This list may not describe all possible side effects. Call your doctor for medical advice about side effects. You may report side effects to FDA at 1-800-FDA-1088. Where should I keep my medication? Keep out of the reach of children and pets. Store at room temperature between 15 and 30 degrees C (59 and 86 degrees F). Protect from light. Keep container tightly closed. Throw away any unused medication after the expiration date. NOTE: This sheet is a summary. It may not cover all possible information. If you have questions about this medicine, talk to your doctor, pharmacist, or health care provider.  2022 Elsevier/Gold Standard (2020-05-11 00:00:00)

## 2021-02-07 NOTE — Progress Notes (Signed)
Electrophysiology Office Note   Date:  02/07/2021   ID:  Amy Stokes, DOB 1951-03-27, MRN [id number]  PCP:  Burnis Medin, MD  Cardiologist:  Harrell Gave Primary Electrophysiologist:  Loy Little Meredith Leeds, MD    Chief Complaint: AF   History of Present Illness: Amy Stokes is a 70 y.o. female who is being seen today for the evaluation of AF at the request of Loel Dubonnet, NP. Presenting today for electrophysiology evaluation.  She has a history significant for atrial fibrillation, hypertension, asthma, obstructive sleep apnea, and obesity.  When she is in atrial fibrillation she is short of breath.  She also notes palpitations.  Today, she denies symptoms of palpitations, chest pain, shortness of breath, orthopnea, PND, lower extremity edema, claudication, dizziness, presyncope, syncope, bleeding, or neurologic sequela. The patient is tolerating medications without difficulties.  She has been having atrial fibrillation on almost a daily basis.  When she is in normal rhythm she feels well.  She has been exercising and trying to lose weight.  She is compliant with her CPAP, though she does state that her oxygen saturations go down again.  She is discussing this with her pulmonologist.  Otherwise, she feels well.   Past Medical History:  Diagnosis Date   Arthritis    no meds   Asthma    Edema    Fibroids    Hyperlipidemia    Hypertension    Obesity    Sleep apnea    wears cpap    Thyroid nodule 01/2009   left discovered on chest ct; needle bx "indeterminant" cannon r/o follicular neoplasm    Past Surgical History:  Procedure Laterality Date   ABDOMINAL HYSTERECTOMY Bilateral 08/02/2013   Procedure: TOTAL ABDOMINAL HYSTERECTOMY WITH BILATERAL SALPINGO-OOPHORECTOMY ;  Surgeon: Terrance Mass, MD;  Location: [facility];  Service: Gynecology;  Laterality: Bilateral;    Dr. Phineas Real to assist.  Dr. Zella Richer to follow with an umbilical hernia repair. He Romey Cohea need  an additional 1 1/2 hours.    COLONOSCOPY  12/27/2007   [facility]    HERNIA REPAIR     MOUTH SURGERY     thyroid needle bx     TONSILLECTOMY     UMBILICAL HERNIA REPAIR N/A 08/02/2013   Procedure: HERNIA REPAIR UMBILICAL ADULT WITH MESH;  Surgeon: Odis Hollingshead, MD;  Location: [facility];  Service: General;  Laterality: N/A;     Current Outpatient Medications  Medication Sig Dispense Refill   acetaminophen (TYLENOL) 650 MG CR tablet Take 650 mg by mouth every 8 (eight) hours as needed for pain.     albuterol (PROAIR HFA) 108 (90 Base) MCG/ACT inhaler Inhale 2 puffs into the lungs every 6 (six) hours as needed for wheezing. 1 each 2   apixaban (ELIQUIS) 5 MG TABS tablet Take 1 tablet (5 mg total) by mouth 2 (two) times daily. 180 tablet 3   Cholecalciferol 50 MCG (2000 UT) CAPS Take 1 capsule by mouth daily. PATIENT USING OIL AND NOT CAPSULES     diltiazem (CARDIZEM CD) 300 MG 24 hr capsule Take 1 capsule (300 mg total) by mouth daily. 30 capsule 2   flecainide (TAMBOCOR) 50 MG tablet Take 1 tablet (50 mg total) by mouth 2 (two) times daily. 60 tablet 3   fluticasone-salmeterol (ADVAIR DISKUS) 250-50 MCG/ACT AEPB Inhale 1 puff into the lungs in the morning and at bedtime. 60 each 11   hydrochlorothiazide (HYDRODIURIL) 25 MG tablet TAKE 1 TABLET (25 MG TOTAL) BY  MOUTH DAILY. 90 tablet 0   ketorolac (ACULAR) 0.5 % ophthalmic solution Place 1 drop into the right eye 2 (two) times daily.     KLOR-CON M10 10 MEQ tablet TAKE 2 TABLETS BY MOUTH DAILY 180 tablet 0   levothyroxine (SYNTHROID) 100 MCG tablet Take 100 mcg by mouth every morning.     loratadine (CLARITIN) 10 MG tablet Take 10 mg by mouth daily.     losartan (COZAAR) 100 MG tablet TAKE 1 TABLET BY MOUTH EVERY DAY 90 tablet 1   Lutein 10 MG TABS Take by mouth.     MULTIPLE VITAMIN PO Take 1 tablet by mouth daily.     Wheat Dextrin (BENEFIBER DRINK MIX PO) Take by mouth.     No current facility-administered medications for this visit.     Allergies:   Patient has no known allergies.   Social History:  The patient  reports that she quit smoking about 49 years ago. Her smoking use included cigarettes. She has a 1.00 pack-year smoking history. She has never used smokeless tobacco. She reports current alcohol use. She reports that she does not use drugs.   Family History:  The patient's family history includes Arthritis in her mother; Heart disease in her brother and father; Other in her mother; Pancreatic cancer in her mother; Thyroid nodules in her sister.    ROS:  Please see the history of present illness.   Otherwise, review of systems is positive for none.   All other systems are reviewed and negative.    PHYSICAL EXAM: VS:  BP 124/80   Pulse 86   Ht 5\' 8"  (1.727 m)   Wt 300 lb (136.1 kg)   SpO2 96%   BMI 45.61 kg/m  , BMI Body mass index is 45.61 kg/m. GEN: Well nourished, well developed, in no acute distress  HEENT: normal  Neck: no JVD, carotid bruits, or masses Cardiac: RRR; no murmurs, rubs, or gallops,no edema  Respiratory:  clear to auscultation bilaterally, normal work of breathing GI: soft, nontender, nondistended, + BS MS: no deformity or atrophy  Skin: warm and dry Neuro:  Strength and sensation are intact Psych: euthymic mood, full affect  EKG:  EKG  is ordered today. Personal review of the ekg ordered shows sinus rhythm, rate 86  Recent Labs: 10/12/2020: TSH 2.00 01/10/2021: ALT 13; BUN 12; Creatinine, Ser 0.74; Hemoglobin 12.8; Platelets 266; Potassium 3.7; Sodium 140    Lipid Panel     Component Value Date/Time   CHOL 174 07/02/2020 0805   TRIG 95.0 07/02/2020 0805   HDL 39.30 07/02/2020 0805   CHOLHDL 4 07/02/2020 0805   VLDL 19.0 07/02/2020 0805   LDLCALC 116 (H) 07/02/2020 0805   LDLDIRECT 112 (H) 01/10/2021 1016   LDLDIRECT 141.7 04/03/2011 1043     Wt Readings from Last 3 Encounters:  02/07/21 300 lb (136.1 kg)  01/10/21 (!) 305 lb 1.6 oz (138.4 kg)  12/10/20 (!) 303 lb  8 oz (137.7 kg)      Other studies Reviewed: Additional studies/ records that were reviewed today include: TTE 11/01/20  Review of the above records today demonstrates:   1. Left ventricular ejection fraction, by estimation, is 55 to 60%. The  left ventricle has normal function. The left ventricle has no regional  wall motion abnormalities. Left ventricular diastolic parameters are  indeterminate.   2. Right ventricular systolic function is normal. The right ventricular  size is mildly enlarged. Tricuspid regurgitation signal is inadequate for  assessing  PA pressure.   3. The mitral valve is grossly normal. No evidence of mitral valve  regurgitation. No evidence of mitral stenosis.   4. The aortic valve is grossly normal. Aortic valve regurgitation is not  visualized. No aortic stenosis is present.   5. The inferior vena cava is dilated in size with >50% respiratory  variability, suggesting right atrial pressure of 8 mmHg.    ASSESSMENT AND PLAN:  1.  Paroxysmal atrial fibrillation: Currently on diltiazem 300 mg daily, Eliquis 5 mg twice daily.  CHA2DS2-VASc of 3.  She has continued to have episodes of atrial fibrillation.  She feels quite poorly when she is out of rhythm.  Due to that, we Serene Kopf plan to start her on flecainide 50 mg twice daily.  2.  Hypertension: Currently well controlled  3.  Hyperlipidemia: Continue statin per primary cardiology  4.  Obstructive sleep apnea: CPAP compliance encouraged  5.  Morbid obesity: Patient has been trying to lose weight.  She has been somewhat unsuccessful.  Daeron Carreno refer [facility].  Case discussed with primary cardiology  Current medicines are reviewed at length with the patient today.   The patient does not have concerns regarding her medicines.  The following changes were made today: Start flecainide 50 mg  Labs/ tests ordered today include:  Orders Placed This Encounter  Procedures   Amb Ref to Medical Weight  Management   EKG 12-Lead     Disposition:   FU with Levorn Oleski 3 months  Signed, Natsuko Kelsay Meredith Leeds, MD  02/07/2021 9:53 AM     [address] [place] [address] [phone] (office) [phone] (fax)

## 2021-02-08 ENCOUNTER — Other Ambulatory Visit (HOSPITAL_BASED_OUTPATIENT_CLINIC_OR_DEPARTMENT_OTHER): Payer: Self-pay

## 2021-02-18 ENCOUNTER — Ambulatory Visit (HOSPITAL_BASED_OUTPATIENT_CLINIC_OR_DEPARTMENT_OTHER): Payer: Medicare HMO | Admitting: Cardiology

## 2021-02-18 ENCOUNTER — Other Ambulatory Visit: Payer: Self-pay

## 2021-02-18 ENCOUNTER — Encounter (HOSPITAL_BASED_OUTPATIENT_CLINIC_OR_DEPARTMENT_OTHER): Payer: Self-pay | Admitting: Cardiology

## 2021-02-18 VITALS — BP 124/72 | HR 71 | Ht 68.0 in | Wt 304.7 lb

## 2021-02-18 DIAGNOSIS — I480 Paroxysmal atrial fibrillation: Secondary | ICD-10-CM | POA: Diagnosis not present

## 2021-02-18 DIAGNOSIS — D6869 Other thrombophilia: Secondary | ICD-10-CM | POA: Diagnosis not present

## 2021-02-18 DIAGNOSIS — I10 Essential (primary) hypertension: Secondary | ICD-10-CM

## 2021-02-18 DIAGNOSIS — E782 Mixed hyperlipidemia: Secondary | ICD-10-CM | POA: Diagnosis not present

## 2021-02-18 DIAGNOSIS — Z7901 Long term (current) use of anticoagulants: Secondary | ICD-10-CM | POA: Diagnosis not present

## 2021-02-18 NOTE — Progress Notes (Signed)
Cardiology Office Note:    Date:  02/18/2021   ID:  Amy Stokes, DOB 09-12-1950, MRN [id number]  PCP:  Burnis Medin, MD  Cardiologist:  [name], MD  Referring MD: Burnis Medin, MD   CC: Follow-up  History of Present Illness:    Amy Stokes is a 70 y.o. female with a hx of hypertension, asthma, OSA, morbid obesity who is seen for follow-up. I initially met her 12/10/2020 as a new consult at the request of Panosh, [name], MD for the evaluation and management of elevated heart rate, concern for atrial fibrillation.  Per note from Dr. Regis Bill dated 10/24/20, noted episodes of elevated heart rate that her apple watch noted as atrial fibrillation. Echo ordered, as below. Referred for further evaluation.  Cardiovascular risk factors: hypertension, OSA, morbid obesity Prior clinical ASCVD: none Comorbid conditions: Endorses hypertension, hyperlipidemia; Denies diabetes, chronic kidney disease  Metabolic syndrome/Obesity: interested in weight loss, discussing GLP1RA with her endocrinologist. Tobacco use history: former Prior pertinent testing and/or incidental findings: echo as below.  Today: Overall, she has been feeling okay. She denies having any feelings of atrial fibrillation, and her Fit Bit has not detected Afib either. She believes the flecainide has been helping.  At home her blood pressure has been fluctuating. She presents a log today, which shows a lowest reading of 111/66, mostly 120-130s, and once was as high as 167/73. There have been no patterns or correlations that she can determine. Typically she takes her medications at 8-9 AM and PM.  While walking, she does not feel as winded and believes her aerobic stamina is improving.  For a time she was suffering from constipation. Now she is taking metamucil twice a day, which seems to be keeping her constipation under better control.  She denies any palpitations, or chest pain. No lightheadedness,  headaches, syncope, orthopnea, PND, or lower extremity edema. No hematuria or hematochezia.   Past Medical History:  Diagnosis Date   Arthritis    no meds   Asthma    Edema    Fibroids    Hyperlipidemia    Hypertension    Obesity    Sleep apnea    wears cpap    Thyroid nodule 01/2009   left discovered on chest ct; needle bx "indeterminant" cannon r/o follicular neoplasm     Past Surgical History:  Procedure Laterality Date   ABDOMINAL HYSTERECTOMY Bilateral 08/02/2013   Procedure: TOTAL ABDOMINAL HYSTERECTOMY WITH BILATERAL SALPINGO-OOPHORECTOMY ;  Surgeon: Terrance Mass, MD;  Location: [facility];  Service: Gynecology;  Laterality: Bilateral;    Dr. Phineas Real to assist.  Dr. Zella Richer to follow with an umbilical hernia repair. He will need an additional 1 1/2 hours.    COLONOSCOPY  12/27/2007   [facility]    HERNIA REPAIR     MOUTH SURGERY     thyroid needle bx     TONSILLECTOMY     UMBILICAL HERNIA REPAIR N/A 08/02/2013   Procedure: HERNIA REPAIR UMBILICAL ADULT WITH MESH;  Surgeon: Odis Hollingshead, MD;  Location: [facility];  Service: General;  Laterality: N/A;    Current Medications: Current Outpatient Medications on File Prior to Visit  Medication Sig   acetaminophen (TYLENOL) 650 MG CR tablet Take 650 mg by mouth every 8 (eight) hours as needed for pain.   albuterol (PROAIR HFA) 108 (90 Base) MCG/ACT inhaler Inhale 2 puffs into the lungs every 6 (six) hours as needed for wheezing.   apixaban (ELIQUIS) 5 MG  TABS tablet Take 1 tablet (5 mg total) by mouth 2 (two) times daily.   Cholecalciferol 50 MCG (2000 UT) CAPS Take 1 capsule by mouth daily. PATIENT USING OIL AND NOT CAPSULES   diltiazem (CARDIZEM CD) 300 MG 24 hr capsule Take 1 capsule (300 mg total) by mouth daily.   flecainide (TAMBOCOR) 50 MG tablet Take 1 tablet (50 mg total) by mouth 2 (two) times daily.   fluticasone-salmeterol (ADVAIR DISKUS) 250-50 MCG/ACT AEPB Inhale 1 puff into the lungs in the morning and at  bedtime.   hydrochlorothiazide (HYDRODIURIL) 25 MG tablet TAKE 1 TABLET (25 MG TOTAL) BY MOUTH DAILY.   KLOR-CON M10 10 MEQ tablet TAKE 2 TABLETS BY MOUTH DAILY   levothyroxine (SYNTHROID) 100 MCG tablet Take 100 mcg by mouth every morning.   losartan (COZAAR) 100 MG tablet TAKE 1 TABLET BY MOUTH EVERY DAY   Lutein 10 MG TABS Take by mouth.   MULTIPLE VITAMIN PO Take 1 tablet by mouth daily.   Wheat Dextrin (BENEFIBER DRINK MIX PO) Take by mouth.   No current facility-administered medications on file prior to visit.     Allergies:   Patient has no known allergies.   Social History   Tobacco Use   Smoking status: Former    Packs/day: 1.00    Years: 1.00    Pack years: 1.00    Types: Cigarettes    Quit date: 04/29/1971    Years since quitting: 49.8   Smokeless tobacco: Never  Vaping Use   Vaping Use: Never used  Substance Use Topics   Alcohol use: Yes    Alcohol/week: 0.0 standard drinks    Comment: occasionally   Drug use: No    Family History: family history includes Arthritis in her mother; Heart disease in her brother and father; Other in her mother; Pancreatic cancer in her mother; Thyroid nodules in her sister. There is no history of Colon cancer, Colon polyps, Esophageal cancer, Rectal cancer, or Stomach cancer.  ROS:   Please see the history of present illness. All other systems are reviewed and negative.    EKGs/Labs/Other Studies Reviewed:    The following studies were reviewed today:  Echo 11/01/20 1. Left ventricular ejection fraction, by estimation, is 55 to 60%. The  left ventricle has normal function. The left ventricle has no regional  wall motion abnormalities. Left ventricular diastolic parameters are  indeterminate.   2. Right ventricular systolic function is normal. The right ventricular  size is mildly enlarged. Tricuspid regurgitation signal is inadequate for assessing PA pressure.   3. The mitral valve is grossly normal. No evidence of mitral  valve  regurgitation. No evidence of mitral stenosis.   4. The aortic valve is grossly normal. Aortic valve regurgitation is not  visualized. No aortic stenosis is present.   5. The inferior vena cava is dilated in size with >50% respiratory  variability, suggesting right atrial pressure of 8 mmHg.   Comparison(s): No significant change from prior study.   EKG:  EKG is personally reviewed.   02/18/2021: NSR at 71 bpm 12/10/20: Atrial fibrillation with RVR at 101 bpm  Recent Labs: 10/12/2020: TSH 2.00 01/10/2021: ALT 13; BUN 12; Creatinine, Ser 0.74; Hemoglobin 12.8; Platelets 266; Potassium 3.7; Sodium 140   Recent Lipid Panel    Component Value Date/Time   CHOL 174 07/02/2020 0805   TRIG 95.0 07/02/2020 0805   HDL 39.30 07/02/2020 0805   CHOLHDL 4 07/02/2020 0805   VLDL 19.0 07/02/2020 0805  LDLCALC 116 (H) 07/02/2020 0805   LDLDIRECT 112 (H) 01/10/2021 1016   LDLDIRECT 141.7 04/03/2011 1043    Physical Exam:    VS:  BP 124/72 (BP Location: Right Arm, Patient Position: Sitting, Cuff Size: Large)   Pulse 71   Ht '5\' 8"'  (1.727 m)   Wt (!) 304 lb 11.2 oz (138.2 kg)   BMI 46.33 kg/m     Wt Readings from Last 3 Encounters:  02/18/21 (!) 304 lb 11.2 oz (138.2 kg)  02/07/21 300 lb (136.1 kg)  01/10/21 (!) 305 lb 1.6 oz (138.4 kg)    GEN: Well nourished, well developed in no acute distress HEENT: Normal, moist mucous membranes NECK: No JVD CARDIAC: regular rhythm on exam, normal S1 and S2, no rubs or gallops. No murmur. VASCULAR: Radial and DP pulses 2+ bilaterally. No carotid bruits RESPIRATORY:  Clear to auscultation without rales, wheezing or rhonchi  ABDOMEN: Soft, non-tender, non-distended MUSCULOSKELETAL:  Ambulates independently SKIN: Warm and dry, chronic venous stasis discoloration.  NEUROLOGIC:  Alert and oriented x 3. No focal neuro deficits noted. PSYCHIATRIC:  Normal affect    ASSESSMENT:    1. Paroxysmal atrial fibrillation (HCC)   2. Secondary  hypercoagulable state ([place])   3. Chronic anticoagulation   4. Essential hypertension   5. Morbid obesity ([place])   6. Mixed hyperlipidemia     PLAN:    Paroxysmal atrial fibrillation: -in NSR today -CHA2DS2/VAS Stroke Risk Points = 3. Continue apixaban -on flecainide, follows with Dr. Curt Bears. -on diltiazem as well  Hypertension: recheck at goal today -continue HCTZ, losartan.  -on diltiazem as well  Hyperlipidemia:  -No known ASCVD, LDL 116.  -ASCVD risk elevated, have discussed statin, continue to discuss  OSA: continue CPAP  Morbid obesity: would be a good candidate for GLP1RA for weight loss    Cardiac risk counseling and prevention recommendations: -recommend heart healthy/Mediterranean diet, with whole grains, fruits, vegetable, fish, lean meats, nuts, and olive oil. Limit salt. -recommend moderate walking, 3-5 times/week for 30-50 minutes each session. Aim for at least 150 minutes.week. Goal should be pace of 3 miles/hours, or walking 1.5 miles in 30 minutes -recommend avoidance of tobacco products. Avoid excess alcohol. -ASCVD risk score: The 10-year ASCVD risk score (Arnett DK, et al., 2019) is: 11%   Values used to calculate the score:     Age: 6 years     Sex: Female     Is Non-Hispanic African American: No     Diabetic: No     Tobacco smoker: No     Systolic Blood Pressure: 025 mmHg     Is BP treated: Yes     HDL Cholesterol: 39.3 mg/dL     Total Cholesterol: 174 mg/dL    Plan for follow up: 6 months or sooner as needed  [name], MD, PhD, [facility]    Medication Adjustments/Labs and Tests Ordered: Current medicines are reviewed at length with the patient today.  Concerns regarding medicines are outlined above.   Orders Placed This Encounter  Procedures   EKG 12-Lead    No orders of the defined types were placed in this encounter.  Patient Instructions  Medication Instructions:  Your Physician recommend you  continue on your current medication as directed.    *If you need a refill on your cardiac medications before your next appointment, please call your pharmacy*   Lab Work: None ordered today   Testing/Procedures: None ordered today   Follow-Up: At [facility],  you and your health needs are our priority.  As part of our continuing mission to provide you with exceptional heart care, we have created designated Provider Care Teams.  These Care Teams include your primary Cardiologist (physician) and Advanced Practice Providers (APPs -  Physician Assistants and Nurse Practitioners) who all work together to provide you with the care you need, when you need it.  We recommend signing up for the patient portal called "MyChart".  Sign up information is provided on this After Visit Summary.  MyChart is used to connect with patients for Virtual Visits (Telemedicine).  Patients are able to view lab/test results, encounter notes, upcoming appointments, etc.  Non-urgent messages can be sent to your provider as well.   To learn more about what you can do with MyChart, go to [email or web address].    Your next appointment:   6 month(s)  The format for your next appointment:   In Person  Provider:   [name], MD     [facility],acting as a scribe for [name], MD.,have documented all relevant documentation on the behalf of [name], MD,as directed by  [name], MD while in the presence of [name], MD.  I, [name], MD, have reviewed all documentation for this visit. The documentation on 02/18/21 for the exam, diagnosis, procedures, and orders are all accurate and complete.   Signed, [name], MD PhD 02/18/2021 1:18 PM    [facility]

## 2021-02-18 NOTE — Patient Instructions (Signed)

## 2021-02-18 NOTE — Patient Instructions (Addendum)
Hi Amy Stokes,  It was great to get to catch up with you again! Don't forget to move your losartan to take it at the same time as your evening dose of Eliquis and to bring you blood pressure cuff to your next office visit.  Please reach out to me if you have any questions or need anything before our follow up!  Best, Maddie  [name], PharmD, [facility]   Visit Information   Goals Addressed   None    Patient Care Plan: CCM Pharmacy Care Plan     Problem Identified: Problem: Hypertension, Hyperlipidemia, GERD, Asthma, Hypothyroidism, Allergic Rhinitis and Jaw pain      Long-Range Goal: Patient-Specific Goal   Start Date: 08/07/2020  Expected End Date: 08/07/2021  Recent Progress: On track  Priority: High  Note:   Current Barriers:  Unable to independently monitor therapeutic efficacy Unable to achieve control of blood pressure   Pharmacist Clinical Goal(s):  Patient will achieve adherence to monitoring guidelines and medication adherence to achieve therapeutic efficacy achieve control of blood pressure as evidenced by home blood pressure readings  through collaboration with PharmD and provider.   Interventions: 1:1 collaboration with Panosh, [name], MD regarding development and update of comprehensive plan of care as evidenced by provider attestation and co-signature Inter-disciplinary care team collaboration (see longitudinal plan of care) Comprehensive medication review performed; medication list updated in electronic medical record  Hypertension (BP goal <140/90) -Uncontrolled -Current treatment: Losartan 100 mg 1 tablet daily - in PM (2:30-4pm) Hydrochlorothiazide 25 mg 1 capsule daily - in AM  Diltiazem 300 mg 1 capsule daily - in AM Potassium daily -Medications previously tried: none -Current home readings: 138/65 HR 76, 116/62, 138/64 HR 75, 140/60 HR 77, 157/73 HR 77 (usually around  130s) -Current dietary habits: did not discuss -Current exercise habits: patient joined [employer] and is going 3 days a week (cross trainer x 15 minutes, weight bearing for arms & legs, core exercises) -Denies hypotensive/hypertensive symptoms -Educated on Exercise goal of 150 minutes per week; Importance of home blood pressure monitoring; Proper BP monitoring technique; -Counseled to monitor BP at home twice weekly, document, and provide log at future appointments -Counseled on diet and exercise extensively Recommended to continue current medication Recommended for patient to move losartan to evening time to provide better all day blood pressure coverage  Hyperlipidemia: (LDL goal < 100) -Uncontrolled -Current treatment: No medications -Medications previously tried: none (records indicate Lipitor and Mevacor but patient denies ever taking these)   -Current dietary patterns: got an air fryer; a lot less cooking oil; eating fish more often; trying to eat less overall; cut out fried foods and no red meat -Current exercise habits: patient joined [employer] and is going 3 days a week (cross trainer x 15 minutes, weight bearing for arms & legs, core exercises), water yoga on Wednesdays -Educated on Cholesterol goals;  Benefits of statin for ASCVD risk reduction; Importance of limiting foods high in cholesterol; Exercise goal of 150 minutes per week; -Counseled on diet and exercise extensively  Atrial Fibrillation (Goal: prevent stroke and major bleeding) -Controlled -CHADSVASC: 3 -Current treatment: Rate control: Diltiazem 300 mg 1 capsule daily Anticoagulation: Eliquis 5 mg 1 tablet twice daily -Medications previously tried: none -Home BP and HR readings: refer to above  -Counseled on increased risk of stroke due to Afib and benefits of anticoagulation for stroke prevention; bleeding risk associated with Eliquis and importance of self-monitoring for signs/symptoms of bleeding; avoidance  of NSAIDs due to increased bleeding risk with anticoagulants; -Assessed patient finances. Patient was approved for Eliquis PAP.  Asthma (Goal: control symptoms) -Controlled -Current treatment  Advair 100-50 mcg/dose 1 puff twice daily  Albuterol HFA 1 puff every 6 hours as needed -Medications previously tried: none  -Pulmonary function testing: n/a -Patient reports consistent use of maintenance inhaler -Frequency of rescue inhaler use: a couple of times within the past few weeks -Counseled on Benefits of consistent maintenance inhaler use When to use rescue inhaler -Recommended to continue current medication Requested a refill for Advair inhaler  Hypothyroidism (Goal: TSH 0.35-4.5) -Controlled -Current treatment  Levothyroxine 100 mcg 1 tablet daily -Medications previously tried: none  -Recommended to continue current medication  GERD (Goal: minimize symptoms) -Controlled -Current treatment  No medications -Medications previously tried: omeprazole (no longer needed)  -Recommended to continue as is  Allergic rhinitis (Goal: minimize symptoms) -Controlled -Current treatment  Claritin 10 mg 1 tablet daily -Medications previously tried: none  -Counseled on avoidance of triggers (dust)  Jaw pain (Goal: minimize pain) -Controlled -Current treatment  Acetaminophen 650 mg 2 tablet as needed (before bed for jaw pain) -Medications previously tried: none  -Recommended to continue current medication Counseled on maximum recommended dose of 3,000 mg/day of tylenol; discussed that Tylenol is found in many over the counter products (especially cough and cold medicine)    Health Maintenance -Vaccine gaps: none -Current therapy:  Multivitamin 1 tablet daily Vitamin D 2000 units daily Optifiber/benefiber daily -Educated on Cost vs benefit of each product must be carefully weighed by individual consumer -Patient is satisfied with current therapy and denies issues -Recommended to  continue current medication Counseled on avoidance of supplementation with vitamin C due to recent kidney stone   Patient Goals/Self-Care Activities Patient will:  - take medications as prescribed check blood pressure twice weekly, document, and provide at future appointments target a minimum of 150 minutes of moderate intensity exercise weekly  Follow Up Plan: Telephone follow up appointment with care management team member scheduled for: 6 months       Patient verbalizes understanding of instructions provided today and agrees to view in [place].  Telephone follow up appointment with pharmacy team member scheduled for:  [name], [facility]

## 2021-02-25 ENCOUNTER — Other Ambulatory Visit: Payer: Self-pay

## 2021-02-25 ENCOUNTER — Inpatient Hospital Stay (HOSPITAL_BASED_OUTPATIENT_CLINIC_OR_DEPARTMENT_OTHER)
Admission: EM | Admit: 2021-02-25 | Discharge: 2021-02-28 | DRG: 193 | Disposition: A | Discharge status: Home / Self Care | Payer: Medicare HMO | Attending: Internal Medicine | Admitting: Internal Medicine

## 2021-02-25 ENCOUNTER — Encounter (HOSPITAL_BASED_OUTPATIENT_CLINIC_OR_DEPARTMENT_OTHER): Payer: Self-pay

## 2021-02-25 ENCOUNTER — Emergency Department (HOSPITAL_BASED_OUTPATIENT_CLINIC_OR_DEPARTMENT_OTHER): Payer: Medicare HMO | Admitting: Radiology

## 2021-02-25 DIAGNOSIS — G4733 Obstructive sleep apnea (adult) (pediatric): Secondary | ICD-10-CM | POA: Diagnosis present

## 2021-02-25 DIAGNOSIS — Z79899 Other long term (current) drug therapy: Secondary | ICD-10-CM

## 2021-02-25 DIAGNOSIS — H40013 Open angle with borderline findings, low risk, bilateral: Secondary | ICD-10-CM | POA: Diagnosis not present

## 2021-02-25 DIAGNOSIS — Z20822 Contact with and (suspected) exposure to covid-19: Secondary | ICD-10-CM | POA: Diagnosis not present

## 2021-02-25 DIAGNOSIS — Z7951 Long term (current) use of inhaled steroids: Secondary | ICD-10-CM

## 2021-02-25 DIAGNOSIS — R059 Cough, unspecified: Secondary | ICD-10-CM | POA: Diagnosis not present

## 2021-02-25 DIAGNOSIS — Z79890 Hormone replacement therapy (postmenopausal): Secondary | ICD-10-CM | POA: Diagnosis not present

## 2021-02-25 DIAGNOSIS — Z87891 Personal history of nicotine dependence: Secondary | ICD-10-CM

## 2021-02-25 DIAGNOSIS — J9601 Acute respiratory failure with hypoxia: Secondary | ICD-10-CM | POA: Diagnosis not present

## 2021-02-25 DIAGNOSIS — J45909 Unspecified asthma, uncomplicated: Secondary | ICD-10-CM | POA: Diagnosis present

## 2021-02-25 DIAGNOSIS — E876 Hypokalemia: Secondary | ICD-10-CM | POA: Diagnosis present

## 2021-02-25 DIAGNOSIS — J189 Pneumonia, unspecified organism: Secondary | ICD-10-CM | POA: Diagnosis not present

## 2021-02-25 DIAGNOSIS — I482 Chronic atrial fibrillation, unspecified: Secondary | ICD-10-CM | POA: Diagnosis not present

## 2021-02-25 DIAGNOSIS — J9600 Acute respiratory failure, unspecified whether with hypoxia or hypercapnia: Secondary | ICD-10-CM | POA: Diagnosis present

## 2021-02-25 DIAGNOSIS — I4891 Unspecified atrial fibrillation: Secondary | ICD-10-CM | POA: Diagnosis not present

## 2021-02-25 DIAGNOSIS — E039 Hypothyroidism, unspecified: Secondary | ICD-10-CM | POA: Diagnosis present

## 2021-02-25 DIAGNOSIS — Z7901 Long term (current) use of anticoagulants: Secondary | ICD-10-CM | POA: Diagnosis not present

## 2021-02-25 DIAGNOSIS — R0602 Shortness of breath: Secondary | ICD-10-CM | POA: Diagnosis not present

## 2021-02-25 DIAGNOSIS — Z8249 Family history of ischemic heart disease and other diseases of the circulatory system: Secondary | ICD-10-CM | POA: Diagnosis not present

## 2021-02-25 DIAGNOSIS — E785 Hyperlipidemia, unspecified: Secondary | ICD-10-CM | POA: Diagnosis present

## 2021-02-25 DIAGNOSIS — J9611 Chronic respiratory failure with hypoxia: Secondary | ICD-10-CM | POA: Diagnosis present

## 2021-02-25 DIAGNOSIS — I10 Essential (primary) hypertension: Secondary | ICD-10-CM | POA: Diagnosis not present

## 2021-02-25 DIAGNOSIS — Z800 Family history of malignant neoplasm of digestive organs: Secondary | ICD-10-CM | POA: Diagnosis not present

## 2021-02-25 DIAGNOSIS — R0902 Hypoxemia: Secondary | ICD-10-CM | POA: Diagnosis not present

## 2021-02-25 DIAGNOSIS — J45901 Unspecified asthma with (acute) exacerbation: Secondary | ICD-10-CM | POA: Diagnosis not present

## 2021-02-25 DIAGNOSIS — Z6841 Body Mass Index (BMI) 40.0 and over, adult: Secondary | ICD-10-CM | POA: Diagnosis not present

## 2021-02-25 DIAGNOSIS — E782 Mixed hyperlipidemia: Secondary | ICD-10-CM | POA: Diagnosis not present

## 2021-02-25 DIAGNOSIS — J129 Viral pneumonia, unspecified: Principal | ICD-10-CM | POA: Diagnosis present

## 2021-02-25 DIAGNOSIS — Z8261 Family history of arthritis: Secondary | ICD-10-CM

## 2021-02-25 DIAGNOSIS — J4540 Moderate persistent asthma, uncomplicated: Secondary | ICD-10-CM | POA: Diagnosis present

## 2021-02-25 DIAGNOSIS — J9621 Acute and chronic respiratory failure with hypoxia: Secondary | ICD-10-CM | POA: Diagnosis present

## 2021-02-25 HISTORY — DX: Pneumonia, unspecified organism: J18.9

## 2021-02-25 LAB — BASIC METABOLIC PANEL
Anion gap: 10 (ref 5–15)
BUN: 15 mg/dL (ref 8–23)
CO2: 32 mmol/L (ref 22–32)
Calcium: 9.9 mg/dL (ref 8.9–10.3)
Chloride: 96 mmol/L — ABNORMAL LOW (ref 98–111)
Creatinine, Ser: 0.71 mg/dL (ref 0.44–1.00)
GFR, Estimated: >60 mL/min (ref >60)
Glucose, Bld: 148 mg/dL — ABNORMAL HIGH (ref 70–99)
Potassium: 3.2 mmol/L — ABNORMAL LOW (ref 3.5–5.1)
Sodium: 138 mmol/L (ref 135–145)

## 2021-02-25 LAB — CBC WITH DIFFERENTIAL/PLATELET
Abs Immature Granulocytes: 0.02 10*3/uL (ref 0.00–0.07)
Basophils Absolute: 0 10*3/uL (ref 0.0–0.1)
Basophils Relative: 0 %
Eosinophils Absolute: 0.3 10*3/uL (ref 0.0–0.5)
Eosinophils Relative: 2 %
HCT: 39.1 % (ref 36.0–46.0)
Hemoglobin: 12.4 g/dL (ref 12.0–15.0)
Immature Granulocytes: 0 %
Lymphocytes Relative: 10 %
Lymphs Abs: 1.2 10*3/uL (ref 0.7–4.0)
MCH: 27.7 pg (ref 26.0–34.0)
MCHC: 31.7 g/dL (ref 30.0–36.0)
MCV: 87.3 fL (ref 80.0–100.0)
Monocytes Absolute: 0.8 10*3/uL (ref 0.1–1.0)
Monocytes Relative: 6 %
Neutro Abs: 9.7 10*3/uL — ABNORMAL HIGH (ref 1.7–7.7)
Neutrophils Relative %: 82 %
Platelets: 272 10*3/uL (ref 150–400)
RBC: 4.48 MIL/uL (ref 3.87–5.11)
RDW: 14.6 % (ref 11.5–15.5)
WBC: 12 10*3/uL — ABNORMAL HIGH (ref 4.0–10.5)
nRBC: 0 % (ref 0.0–0.2)

## 2021-02-25 LAB — D-DIMER, QUANTITATIVE: D-Dimer, Quant: 0.46 ug/mL-FEU (ref 0.00–0.50)

## 2021-02-25 LAB — RESP PANEL BY RT-PCR (FLU A&B, COVID) ARPGX2
Influenza A by PCR: NEGATIVE
Influenza B by PCR: NEGATIVE
SARS Coronavirus 2 by RT PCR: NEGATIVE

## 2021-02-25 LAB — LACTIC ACID, PLASMA: Lactic Acid, Venous: 0.9 mmol/L (ref 0.5–1.9)

## 2021-02-25 MED ORDER — IPRATROPIUM-ALBUTEROL 0.5-2.5 (3) MG/3ML IN SOLN
3.0000 mL | Freq: Once | RESPIRATORY_TRACT | Status: AC
  Administered 2021-02-25: 22:00:00 3 mL via RESPIRATORY_TRACT
  Filled 2021-02-25: qty 3

## 2021-02-25 MED ORDER — MAGNESIUM SULFATE 2 GM/50ML IV SOLN
2.0000 g | Freq: Once | INTRAVENOUS | Status: AC
  Administered 2021-02-25: 22:00:00 2 g via INTRAVENOUS
  Filled 2021-02-25: qty 50

## 2021-02-25 MED ORDER — METHYLPREDNISOLONE SODIUM SUCC 125 MG IJ SOLR
125.0000 mg | Freq: Once | INTRAMUSCULAR | Status: AC
  Administered 2021-02-25: 22:00:00 125 mg via INTRAVENOUS
  Filled 2021-02-25: qty 2

## 2021-02-25 MED ORDER — SODIUM CHLORIDE 0.9 % IV SOLN
500.0000 mg | Freq: Once | INTRAVENOUS | Status: AC
  Administered 2021-02-25: 22:00:00 500 mg via INTRAVENOUS
  Filled 2021-02-25: qty 500

## 2021-02-25 MED ORDER — SODIUM CHLORIDE 0.9 % IV SOLN
1.0000 g | Freq: Once | INTRAVENOUS | Status: AC
  Administered 2021-02-25: 22:00:00 1 g via INTRAVENOUS
  Filled 2021-02-25: qty 10

## 2021-02-25 MED ORDER — ALBUTEROL SULFATE HFA 108 (90 BASE) MCG/ACT IN AERS
2.0000 | INHALATION_SPRAY | RESPIRATORY_TRACT | Status: DC | PRN
  Administered 2021-02-25: 20:00:00 2 via RESPIRATORY_TRACT
  Filled 2021-02-25: qty 6.7

## 2021-02-25 NOTE — ED Notes (Signed)
[facility] ETA 10 minutes 

## 2021-02-25 NOTE — Progress Notes (Signed)
  [facility] will assume care on arrival to accepting facility. Until arrival, care as per EDP. However, [facility] available 24/7 for questions and assistance.   Nursing staff please page [facility] Admits and Consults ([phone]) as soon as the patient arrives to the hospital.  Khyson Sebesta, DO [facility]  

## 2021-02-25 NOTE — ED Triage Notes (Signed)
Patient from home with c/o cough, SOB x a couple days. SOB is worse with exertion. Patient used albuterol PTA with minimal improvement. Patient is winded in triage.

## 2021-02-25 NOTE — ED Triage Notes (Signed)
Patient O2 sat upon arriving to triage was 87% with ambulation.

## 2021-02-25 NOTE — ED Provider Notes (Signed)
[facility] DEPT Provider Note   CSN: [id number] Arrival date & time: 02/25/21  1855     History Chief Complaint  Patient presents with   Shortness of Breath   Cough    Amy Stokes is a 70 y.o. female.  The history is provided by the patient and medical records. No language interpreter was used.  Shortness of Breath Severity:  Severe Onset quality:  Gradual Timing:  Constant Progression:  Unchanged Chronicity:  New Context: URI   Relieved by:  Nothing Worsened by:  Exertion Ineffective treatments:  None tried Associated symptoms: cough, sputum production and wheezing   Associated symptoms: no abdominal pain, no chest pain, no claudication, no diaphoresis, no fever, no headaches, no hemoptysis, no neck pain, no rash, no syncope and no vomiting   Cough Cough characteristics:  Productive Sputum characteristics:  Nondescript Severity:  Severe Onset quality:  Gradual Timing:  Constant Associated symptoms: chills, shortness of breath and wheezing   Associated symptoms: no chest pain, no diaphoresis, no fever, no headaches and no rash       Past Medical History:  Diagnosis Date   Arthritis    no meds   Asthma    Edema    Fibroids    Hyperlipidemia    Hypertension    Obesity    Sleep apnea    wears cpap    Thyroid nodule 01/2009   left discovered on chest ct; needle bx "indeterminant" cannon r/o follicular neoplasm     Patient Active Problem List   Diagnosis Date Noted   Cough 08/09/2015   Iliotibial band syndrome affecting left lower leg 05/26/2014   Arthritis of left lower extremity 05/26/2014   Low HDL (under 40) 04/08/2014   Thyroid nodule 02/12/2014   OSA (obstructive sleep apnea) 21/19/4174   Seroma complicating a procedure 08/18/2013   Bleeding gums 06/09/2011   ALLERGY 01/31/2010   MORBID OBESITY 07/16/2009   Multinodular goiter (nontoxic) 01/13/2008   RECTAL BLEEDING 11/22/2007   UNS ADVRS EFF UNS RX  MEDICINAL&BIOLOGICAL SBSTNC 07/26/2007   HYPERLIPIDEMIA 04/06/2007   HYPERTENSION 03/05/2007   TACHYCARDIA 03/05/2007   Asthma 12/28/2006    Past Surgical History:  Procedure Laterality Date   ABDOMINAL HYSTERECTOMY Bilateral 08/02/2013   Procedure: TOTAL ABDOMINAL HYSTERECTOMY WITH BILATERAL SALPINGO-OOPHORECTOMY ;  Surgeon: Terrance Mass, MD;  Location: [facility];  Service: Gynecology;  Laterality: Bilateral;    Dr. Phineas Real to assist.  Dr. Zella Richer to follow with an umbilical hernia repair. He will need an additional 1 1/2 hours.    COLONOSCOPY  12/27/2007   [facility]    HERNIA REPAIR     MOUTH SURGERY     thyroid needle bx     TONSILLECTOMY     UMBILICAL HERNIA REPAIR N/A 08/02/2013   Procedure: HERNIA REPAIR UMBILICAL ADULT WITH MESH;  Surgeon: Odis Hollingshead, MD;  Location: [facility];  Service: General;  Laterality: N/A;     OB History     Gravida  1   Para  0   Term      Preterm      AB  1   Living  0      SAB  1   IAB      Ectopic      Multiple      Live Births              Family History  Problem Relation Age of Onset   Arthritis Mother    Other  Mother        neurological disorder   Pancreatic cancer Mother    Heart disease Father    Thyroid nodules Sister    Heart disease Brother        x2 1 brother deceased   Colon cancer Neg Hx    Colon polyps Neg Hx    Esophageal cancer Neg Hx    Rectal cancer Neg Hx    Stomach cancer Neg Hx     Social History   Tobacco Use   Smoking status: Former    Packs/day: 1.00    Years: 1.00    Pack years: 1.00    Types: Cigarettes    Quit date: 04/29/1971    Years since quitting: 49.8   Smokeless tobacco: Never  Vaping Use   Vaping Use: Never used  Substance Use Topics   Alcohol use: Yes    Alcohol/week: 0.0 standard drinks    Comment: occasionally   Drug use: No    Home Medications Prior to Admission medications   Medication Sig Start Date End Date Taking? Authorizing Provider   acetaminophen (TYLENOL) 650 MG CR tablet Take 650 mg by mouth every 8 (eight) hours as needed for pain.    [provider]  albuterol (PROAIR HFA) 108 (90 Base) MCG/ACT inhaler Inhale 2 puffs into the lungs every 6 (six) hours as needed for wheezing. 01/17/20   Panosh, [name], MD  apixaban (ELIQUIS) 5 MG TABS tablet Take 1 tablet (5 mg total) by mouth 2 (two) times daily. 12/10/20   [name], MD  Cholecalciferol 50 MCG (2000 UT) CAPS Take 1 capsule by mouth daily. PATIENT USING OIL AND NOT CAPSULES    [provider]  diltiazem (CARDIZEM CD) 300 MG 24 hr capsule Take 1 capsule (300 mg total) by mouth daily. 01/10/21   Loel Dubonnet, NP  flecainide (TAMBOCOR) 50 MG tablet Take 1 tablet (50 mg total) by mouth 2 (two) times daily. 02/07/21   Camnitz, Will Hassell Done, MD  fluticasone-salmeterol (ADVAIR DISKUS) 250-50 MCG/ACT AEPB Inhale 1 puff into the lungs in the morning and at bedtime. 11/20/20   Olalere, Cicero Duck A, MD  hydrochlorothiazide (HYDRODIURIL) 25 MG tablet TAKE 1 TABLET (25 MG TOTAL) BY MOUTH DAILY. 01/09/21   Laurey Morale, MD  KLOR-CON M10 10 MEQ tablet TAKE 2 TABLETS BY MOUTH DAILY 02/05/21   Panosh, [name], MD  levothyroxine (SYNTHROID) 100 MCG tablet Take 100 mcg by mouth every morning. 11/07/19   [provider]  losartan (COZAAR) 100 MG tablet TAKE 1 TABLET BY MOUTH EVERY DAY 07/23/20   Panosh, [name], MD  Lutein 10 MG TABS Take by mouth.    [provider]  MULTIPLE VITAMIN PO Take 1 tablet by mouth daily.    [provider]  Wheat Dextrin (BENEFIBER DRINK MIX PO) Take by mouth.    [provider]    Allergies    Patient has no known allergies.  Review of Systems   Review of Systems  Constitutional:  Positive for chills and fatigue. Negative for diaphoresis and fever.  HENT:  Positive for congestion.   Respiratory:  Positive for cough, sputum production, chest tightness, shortness of breath and wheezing.  Negative for hemoptysis and stridor.   Cardiovascular:  Positive for leg swelling (at baseline). Negative for chest pain, palpitations, claudication and syncope.  Gastrointestinal:  Negative for abdominal pain, constipation, diarrhea, nausea and vomiting.  Genitourinary:  Negative for dysuria.  Musculoskeletal:  Negative for back  pain, neck pain and neck stiffness.  Skin:  Negative for rash and wound.  Neurological:  Negative for light-headedness and headaches.  Psychiatric/Behavioral:  Negative for agitation and confusion.   All other systems reviewed and are negative.  Physical Exam Updated Vital Signs BP (!) 121/52   Pulse 63   Temp 98.3 F (36.8 C)   Resp 15   Ht 5\' 8"  (1.727 m)   Wt 136.1 kg   SpO2 99%   BMI 45.61 kg/m   Physical Exam Vitals and nursing note reviewed.  Constitutional:      General: She is not in acute distress.    Appearance: She is well-developed. She is not ill-appearing, toxic-appearing or diaphoretic.  HENT:     Head: Normocephalic and atraumatic.     Mouth/Throat:     Mouth: Mucous membranes are moist.     Pharynx: No pharyngeal swelling.  Eyes:     Extraocular Movements: Extraocular movements intact.     Conjunctiva/sclera: Conjunctivae normal.     Pupils: Pupils are equal, round, and reactive to light.  Cardiovascular:     Rate and Rhythm: Normal rate and regular rhythm. No extrasystoles are present.    Pulses: Normal pulses.     Heart sounds: No murmur heard. Pulmonary:     Effort: Pulmonary effort is normal. No tachypnea or respiratory distress.     Breath sounds: Wheezing and rhonchi present. No decreased breath sounds or rales.  Chest:     Chest wall: No tenderness.  Abdominal:     Palpations: Abdomen is soft.     Tenderness: There is no abdominal tenderness.  Musculoskeletal:        General: No swelling.     Cervical back: Neck supple.     Right lower leg: No tenderness. Edema present.     Left lower leg: No tenderness. Edema  present.  Skin:    General: Skin is warm and dry.     Capillary Refill: Capillary refill takes less than 2 seconds.     Findings: No erythema.  Neurological:     General: No focal deficit present.     Mental Status: She is alert.  Psychiatric:        Mood and Affect: Mood normal.    ED Results / Procedures / Treatments   Labs (all labs ordered are listed, but only abnormal results are displayed) Labs Reviewed  CBC WITH DIFFERENTIAL/PLATELET - Abnormal; Notable for the following components:      Result Value   WBC 12.0 (*)    Neutro Abs 9.7 (*)    All other components within normal limits  BASIC METABOLIC PANEL - Abnormal; Notable for the following components:   Potassium 3.2 (*)    Chloride 96 (*)    Glucose, Bld 148 (*)    All other components within normal limits  RESP PANEL BY RT-PCR (FLU A&B, COVID) ARPGX2  CULTURE, BLOOD (ROUTINE X 2)  CULTURE, BLOOD (ROUTINE X 2)  D-DIMER, QUANTITATIVE  LACTIC ACID, PLASMA  LACTIC ACID, PLASMA    EKG None  Radiology DG Chest 2 View  Result Date: 02/25/2021 CLINICAL DATA:  Coughing and shortness of breath. EXAM: CHEST - 2 VIEW COMPARISON:  Chest PA Lat 04/11/2013. FINDINGS: The heart size and mediastinal contours are within normal limits. The lungs hyperexpanded with increased streaky opacity in the right middle lobe infrahilar area medially, which could be due to pneumonia or atelectasis, with adjacent eventration and chronic elevation of the anterior right diaphragm. The  remaining lungs are clear. No pleural effusion is seen. Thoracic spondylosis. IMPRESSION: Right middle lobe infrahilar atelectasis or focal pneumonia. Pneumonia is favored due to the similar inspiration and lack of this finding on the prior study. Clinical correlation and radiographic follow-up recommended. Electronically Signed   By: Telford Nab M.D.   On: 02/25/2021 20:12    Procedures Procedures   Medications Ordered in ED Medications  albuterol (VENTOLIN  HFA) 108 (90 Base) MCG/ACT inhaler 2 puff (2 puffs Inhalation Given 02/25/21 1955)  cefTRIAXone (ROCEPHIN) 1 g in sodium chloride 0.9 % 100 mL IVPB (0 g Intravenous Stopped 02/25/21 2256)  azithromycin (ZITHROMAX) 500 mg in sodium chloride 0.9 % 250 mL IVPB (500 mg Intravenous New Bag/Given 02/25/21 2206)  methylPREDNISolone sodium succinate (SOLU-MEDROL) 125 mg/2 mL injection 125 mg (125 mg Intravenous Given 02/25/21 2156)  magnesium sulfate IVPB 2 g 50 mL (2 g Intravenous New Bag/Given 02/25/21 2207)  ipratropium-albuterol (DUONEB) 0.5-2.5 (3) MG/3ML nebulizer solution 3 mL (3 mLs Nebulization Given 02/25/21 2135)    ED Course  I have reviewed the triage vital signs and the nursing notes.  Pertinent labs & imaging results that were available during my care of the patient were reviewed by me and considered in my medical decision making (see chart for details).    MDM Rules/Calculators/A&P                           Amy Stokes is a 70 y.o. female with a past medical history significant for hypertension, hyperlipidemia, A. fib on Eliquis therapy, sleep apnea using CPAP at night, and asthma who presents with several days of worsening cough, shortness of breath, and fatigue.  Patient reports that she has had URI symptoms with chills and cough for the last few days and thinks her husband may given it to her.  She reports that today the symptoms were worsening with worsened wheezing and productive cough.  Denies hemoptysis or chest pain.  Reports that when she exerted herself she was more short of breath which is different.  She denies new leg pain or leg swelling and denies any nausea, vomiting, constipation, diarrhea, or urinary changes.  Denies any palpitations.  Denies syncope.  On arrival, but when ambulating to the room her oxygen saturation dropped into the mid 80s and she was more short of breath.  Patient has been having coughing fits.  On my exam, lungs have coarseness and wheezing but  no rales.  Chest and abdomen were nontender.  Back and flanks nontender.  Legs are edematous but she reports is unchanged from her baseline.  Patient now resting on 2-1/2 L nasal cannula to maintain oxygen saturations in the 90s.  EKG shows A. Fib.  No STEMI.  Patient had work-up started in triage which showed she was negative for COVID and flu but was positive for pneumonia.  Patient has a leukocytosis of 12.  D-dimer was negative.  Kidney function is normal.  Mild hypokalemia.  Given the discovery of pneumonia, new oxygen requirement, and leukocytosis I do feel she needs admission with antibiotics.  We will go ahead and give her another breathing treatment, magnesium, and Solu-Medrol.  We will get blood cultures, lactic acid test, and EKG although she is denying any chest pain whatsoever.  Anticipate admission for pneumonia with hypoxia.   Final Clinical Impression(s) / ED Diagnoses Final diagnoses:  Community acquired pneumonia, unspecified laterality  Hypoxia     Clinical Impression: 1.  Community acquired pneumonia, unspecified laterality   2. Hypoxia     Disposition: Admit  This note was prepared with assistance of Dragon voice recognition software. Occasional wrong-word or sound-a-like substitutions may have occurred due to the inherent limitations of voice recognition software.     Danylle Ouk, Gwenyth Allegra, MD 02/25/21 [phone]

## 2021-02-26 ENCOUNTER — Encounter (HOSPITAL_COMMUNITY): Payer: Self-pay | Admitting: Internal Medicine

## 2021-02-26 DIAGNOSIS — E876 Hypokalemia: Secondary | ICD-10-CM

## 2021-02-26 DIAGNOSIS — J9611 Chronic respiratory failure with hypoxia: Secondary | ICD-10-CM | POA: Diagnosis present

## 2021-02-26 DIAGNOSIS — J9601 Acute respiratory failure with hypoxia: Secondary | ICD-10-CM | POA: Diagnosis present

## 2021-02-26 DIAGNOSIS — I10 Essential (primary) hypertension: Secondary | ICD-10-CM

## 2021-02-26 DIAGNOSIS — J189 Pneumonia, unspecified organism: Secondary | ICD-10-CM

## 2021-02-26 DIAGNOSIS — J9621 Acute and chronic respiratory failure with hypoxia: Secondary | ICD-10-CM

## 2021-02-26 DIAGNOSIS — J9600 Acute respiratory failure, unspecified whether with hypoxia or hypercapnia: Secondary | ICD-10-CM | POA: Diagnosis present

## 2021-02-26 LAB — CBC WITH DIFFERENTIAL/PLATELET
Abs Immature Granulocytes: 0.05 10*3/uL (ref 0.00–0.07)
Basophils Absolute: 0 10*3/uL (ref 0.0–0.1)
Basophils Relative: 0 %
Eosinophils Absolute: 0 10*3/uL (ref 0.0–0.5)
Eosinophils Relative: 0 %
HCT: 38.1 % (ref 36.0–46.0)
Hemoglobin: 11.9 g/dL — ABNORMAL LOW (ref 12.0–15.0)
Immature Granulocytes: 1 %
Lymphocytes Relative: 4 %
Lymphs Abs: 0.4 10*3/uL — ABNORMAL LOW (ref 0.7–4.0)
MCH: 28.1 pg (ref 26.0–34.0)
MCHC: 31.2 g/dL (ref 30.0–36.0)
MCV: 89.9 fL (ref 80.0–100.0)
Monocytes Absolute: 0.1 10*3/uL (ref 0.1–1.0)
Monocytes Relative: 1 %
Neutro Abs: 9.4 10*3/uL — ABNORMAL HIGH (ref 1.7–7.7)
Neutrophils Relative %: 94 %
Platelets: 257 10*3/uL (ref 150–400)
RBC: 4.24 MIL/uL (ref 3.87–5.11)
RDW: 14.9 % (ref 11.5–15.5)
WBC: 9.9 10*3/uL (ref 4.0–10.5)
nRBC: 0 % (ref 0.0–0.2)

## 2021-02-26 LAB — COMPREHENSIVE METABOLIC PANEL
ALT: 18 U/L (ref 0–44)
AST: 15 U/L (ref 15–41)
Albumin: 3.1 g/dL — ABNORMAL LOW (ref 3.5–5.0)
Alkaline Phosphatase: 72 U/L (ref 38–126)
Anion gap: 7 (ref 5–15)
BUN: 17 mg/dL (ref 8–23)
CO2: 34 mmol/L — ABNORMAL HIGH (ref 22–32)
Calcium: 8.8 mg/dL — ABNORMAL LOW (ref 8.9–10.3)
Chloride: 96 mmol/L — ABNORMAL LOW (ref 98–111)
Creatinine, Ser: 0.67 mg/dL (ref 0.44–1.00)
GFR, Estimated: >60 mL/min (ref >60)
Glucose, Bld: 182 mg/dL — ABNORMAL HIGH (ref 70–99)
Potassium: 3.3 mmol/L — ABNORMAL LOW (ref 3.5–5.1)
Sodium: 137 mmol/L (ref 135–145)
Total Bilirubin: 0.4 mg/dL (ref 0.3–1.2)
Total Protein: 7.2 g/dL (ref 6.5–8.1)

## 2021-02-26 LAB — MAGNESIUM: Magnesium: 2.5 mg/dL — ABNORMAL HIGH (ref 1.7–2.4)

## 2021-02-26 LAB — HIV ANTIBODY (ROUTINE TESTING W REFLEX): HIV Screen 4th Generation wRfx: NONREACTIVE

## 2021-02-26 MED ORDER — APIXABAN 5 MG PO TABS
ORAL_TABLET | ORAL | Status: AC
  Filled 2021-02-26: qty 1

## 2021-02-26 MED ORDER — MOMETASONE FURO-FORMOTEROL FUM 200-5 MCG/ACT IN AERO
2.0000 | INHALATION_SPRAY | Freq: Two times a day (BID) | RESPIRATORY_TRACT | Status: DC
  Administered 2021-02-26 – 2021-02-28 (×5): 2 via RESPIRATORY_TRACT
  Filled 2021-02-26: qty 8.8

## 2021-02-26 MED ORDER — APIXABAN 5 MG PO TABS
5.0000 mg | ORAL_TABLET | Freq: Two times a day (BID) | ORAL | Status: DC
  Administered 2021-02-26 – 2021-02-28 (×5): 5 mg via ORAL
  Filled 2021-02-26 (×3): qty 1

## 2021-02-26 MED ORDER — CEFTRIAXONE SODIUM 2 G IJ SOLR
2.0000 g | INTRAMUSCULAR | Status: DC
  Administered 2021-02-26: 2 g via INTRAVENOUS
  Filled 2021-02-26: qty 20

## 2021-02-26 MED ORDER — POTASSIUM CHLORIDE CRYS ER 20 MEQ PO TBCR: 20.0000 meq | EXTENDED_RELEASE_TABLET | Freq: Every day | ORAL | Status: DC

## 2021-02-26 MED ORDER — DOXYCYCLINE HYCLATE 100 MG PO TABS
ORAL_TABLET | ORAL | Status: AC
  Administered 2021-02-26: 100 mg
  Filled 2021-02-26: qty 1

## 2021-02-26 MED ORDER — HYDROCHLOROTHIAZIDE 25 MG PO TABS: 25.0000 mg | ORAL_TABLET | Freq: Every day | ORAL | Status: DC

## 2021-02-26 MED ORDER — SODIUM CHLORIDE 0.9 % IV BOLUS
500.0000 mL | Freq: Once | INTRAVENOUS | Status: AC
Start: 2021-02-26 — End: 2021-02-26
  Administered 2021-02-26: 500 mL via INTRAVENOUS

## 2021-02-26 MED ORDER — POTASSIUM CHLORIDE CRYS ER 20 MEQ PO TBCR
20.0000 meq | EXTENDED_RELEASE_TABLET | Freq: Once | ORAL | Status: AC
  Administered 2021-02-26: 20 meq via ORAL

## 2021-02-26 MED ORDER — ALBUTEROL SULFATE (2.5 MG/3ML) 0.083% IN NEBU
3.0000 mL | INHALATION_SOLUTION | Freq: Four times a day (QID) | RESPIRATORY_TRACT | Status: DC | PRN
  Administered 2021-02-26 – 2021-02-27 (×2): 3 mL via RESPIRATORY_TRACT
  Filled 2021-02-26: qty 3

## 2021-02-26 MED ORDER — ACETAMINOPHEN 325 MG PO TABS: 650.0000 mg | ORAL_TABLET | Freq: Four times a day (QID) | ORAL | Status: DC | PRN

## 2021-02-26 MED ORDER — FLECAINIDE ACETATE 50 MG PO TABS
50.0000 mg | ORAL_TABLET | Freq: Two times a day (BID) | ORAL | Status: DC
  Administered 2021-02-26 – 2021-02-28 (×5): 50 mg via ORAL
  Filled 2021-02-26 (×6): qty 1

## 2021-02-26 MED ORDER — DILTIAZEM HCL ER COATED BEADS 180 MG PO CP24
300.0000 mg | ORAL_CAPSULE | Freq: Every day | ORAL | Status: DC
  Administered 2021-02-26 – 2021-02-28 (×3): 300 mg via ORAL
  Filled 2021-02-26 (×3): qty 1

## 2021-02-26 MED ORDER — LEVOTHYROXINE SODIUM 100 MCG PO TABS
100.0000 ug | ORAL_TABLET | Freq: Every day | ORAL | Status: DC
  Administered 2021-02-26 – 2021-02-28 (×3): 100 ug via ORAL
  Filled 2021-02-26 (×2): qty 1

## 2021-02-26 MED ORDER — ACETAMINOPHEN 650 MG RE SUPP: 650.0000 mg | Freq: Four times a day (QID) | RECTAL | Status: DC | PRN

## 2021-02-26 MED ORDER — POTASSIUM CHLORIDE CRYS ER 20 MEQ PO TBCR
40.0000 meq | EXTENDED_RELEASE_TABLET | Freq: Every day | ORAL | Status: AC
  Administered 2021-02-26 – 2021-02-27 (×2): 40 meq via ORAL
  Filled 2021-02-26: qty 2

## 2021-02-26 MED ORDER — DOXYCYCLINE HYCLATE 100 MG PO TABS
100.0000 mg | ORAL_TABLET | Freq: Two times a day (BID) | ORAL | Status: DC
  Administered 2021-02-26 – 2021-02-28 (×4): 100 mg via ORAL
  Filled 2021-02-26 (×3): qty 1

## 2021-02-26 MED ORDER — LOSARTAN POTASSIUM 50 MG PO TABS
100.0000 mg | ORAL_TABLET | Freq: Every evening | ORAL | Status: DC
  Administered 2021-02-26 – 2021-02-27 (×2): 100 mg via ORAL
  Filled 2021-02-26: qty 2

## 2021-02-26 NOTE — H&P (Signed)
History and Physical    AAMARI Stokes NFA:[id number] DOB: [birth year] DOA: 02/25/2021  PCP: Burnis Medin, MD  Patient coming from: Home.  Chief Complaint: Shortness of breath.  HPI: Amy Stokes is a 70 y.o. female with history of A. fib, sleep apnea, hypertension, asthma has been experiencing cough and shortness of breath for the last 2 days.  Patient states her husband was sick last week also with respiratory symptoms.  Patient denies any chest pain.  Has been having productive cough with subjective feeling of fever chills.  ED Course: In the ER patient was found to be hypoxic on ambulation with saturations falling to 87%.  Initially found to be mildly wheezing was given steroids.  Chest x-ray confirms to be having right middle lobe pneumonia.  COVID test and flu test were negative.  Labs show mild hypokalemia 3.2 but he has a normal WBC count of 12,000.  EKG shows A. fib rate controlled.  Patient started on empiric antibiotics and admitted for pneumonia since patient was hypoxic on ambulation.  Review of Systems: As per HPI, rest all negative.   Past Medical History:  Diagnosis Date   Arthritis    no meds   Asthma    Edema    Fibroids    Hyperlipidemia    Hypertension    Obesity    Sleep apnea    wears cpap    Thyroid nodule 01/2009   left discovered on chest ct; needle bx "indeterminant" cannon r/o follicular neoplasm     Past Surgical History:  Procedure Laterality Date   ABDOMINAL HYSTERECTOMY Bilateral 08/02/2013   Procedure: TOTAL ABDOMINAL HYSTERECTOMY WITH BILATERAL SALPINGO-OOPHORECTOMY ;  Surgeon: Terrance Mass, MD;  Location: [facility];  Service: Gynecology;  Laterality: Bilateral;    Dr. Phineas Real to assist.  Dr. Zella Richer to follow with an umbilical hernia repair. He will need an additional 1 1/2 hours.    COLONOSCOPY  12/27/2007   [facility]    HERNIA REPAIR     MOUTH SURGERY     thyroid needle bx     TONSILLECTOMY     UMBILICAL HERNIA REPAIR  N/A 08/02/2013   Procedure: HERNIA REPAIR UMBILICAL ADULT WITH MESH;  Surgeon: Odis Hollingshead, MD;  Location: [facility];  Service: General;  Laterality: N/A;     reports that she quit smoking about 49 years ago. Her smoking use included cigarettes. She has a 1.00 pack-year smoking history. She has never used smokeless tobacco. She reports current alcohol use. She reports that she does not use drugs.  No Known Allergies  Family History  Problem Relation Age of Onset   Arthritis Mother    Other Mother        neurological disorder   Pancreatic cancer Mother    Heart disease Father    Thyroid nodules Sister    Heart disease Brother        x2 1 brother deceased   Colon cancer Neg Hx    Colon polyps Neg Hx    Esophageal cancer Neg Hx    Rectal cancer Neg Hx    Stomach cancer Neg Hx     Prior to Admission medications   Medication Sig Start Date End Date Taking? Authorizing Provider  acetaminophen (TYLENOL) 650 MG CR tablet Take 1,300 mg by mouth every 8 (eight) hours as needed for pain.   Yes [provider]  albuterol (PROAIR HFA) 108 (90 Base) MCG/ACT inhaler Inhale 2 puffs into the lungs every 6 (  six) hours as needed for wheezing. 01/17/20  Yes Panosh, [name], MD  apixaban (ELIQUIS) 5 MG TABS tablet Take 1 tablet (5 mg total) by mouth 2 (two) times daily. 12/10/20  Yes [name], MD  Cholecalciferol 50 MCG (2000 UT) CAPS Take 1 capsule by mouth daily. PATIENT USING OIL AND NOT CAPSULES   Yes [provider]  diltiazem (CARDIZEM CD) 300 MG 24 hr capsule Take 1 capsule (300 mg total) by mouth daily. 01/10/21  Yes Loel Dubonnet, NP  flecainide (TAMBOCOR) 50 MG tablet Take 1 tablet (50 mg total) by mouth 2 (two) times daily. 02/07/21  Yes Camnitz, Will Hassell Done, MD  fluticasone-salmeterol (ADVAIR DISKUS) 250-50 MCG/ACT AEPB Inhale 1 puff into the lungs in the morning and at bedtime. 11/20/20  Yes Olalere, Adewale A, MD  hydrochlorothiazide (HYDRODIURIL) 25 MG  tablet TAKE 1 TABLET (25 MG TOTAL) BY MOUTH DAILY. 01/09/21  Yes Laurey Morale, MD  KLOR-CON M10 10 MEQ tablet TAKE 2 TABLETS BY MOUTH DAILY Patient taking differently: 20 mEq daily. 02/05/21  Yes Panosh, [name], MD  levothyroxine (SYNTHROID) 100 MCG tablet Take 100 mcg by mouth every morning. 11/07/19  Yes [provider]  losartan (COZAAR) 100 MG tablet TAKE 1 TABLET BY MOUTH EVERY DAY Patient taking differently: Take 100 mg by mouth every evening. 07/23/20  Yes Panosh, [name], MD  Lutein 10 MG TABS Take 10 mg by mouth daily.   Yes [provider]  MULTIPLE VITAMIN PO Take 1 tablet by mouth daily.   Yes [provider]  PSYLLIUM PO Take 1 packet by mouth daily.   Yes [provider]    Physical Exam: Constitutional: Moderately built and nourished. Vitals:   02/25/21 2130 02/25/21 2136 02/25/21 2233 02/25/21 2334  BP: (!) 105/54  (!) 112/55 (!) 129/58  Pulse: 67  67 86  Resp: 20  19 20   Temp:   99.1 F (37.3 C) 98.2 F (36.8 C)  TempSrc:   Oral   SpO2: 99% 97% 94% 100%  Weight:      Height:       Eyes: Anicteric no pallor. ENMT: No discharge from the ears eyes nose and mouth. Neck: No mass felt.  No neck rigidity. Respiratory: Mild wheezing no crepitations. Cardiovascular: S1-S2 heard. Abdomen: Soft nontender bowel sound present. Musculoskeletal: Mild edema of the lower extremity. Skin: No rash. Neurologic: Alert awake oriented to time place and person.  Moves all extremities. Psychiatric: Appears normal.  Normal affect.   Labs on Admission: I have personally reviewed following labs and imaging studies  CBC: Recent Labs  Lab 02/25/21 2000  WBC 12.0*  NEUTROABS 9.7*  HGB 12.4  HCT 39.1  MCV 87.3  PLT 694   Basic Metabolic Panel: Recent Labs  Lab 02/25/21 2000  NA 138  K 3.2*  CL 96*  CO2 32  GLUCOSE 148*  BUN 15  CREATININE 0.71  CALCIUM 9.9   GFR: Estimated Creatinine Clearance: 97.2 mL/min (by C-G formula based on  SCr of 0.71 mg/dL). Liver Function Tests: No results for input(s): AST, ALT, ALKPHOS, BILITOT, PROT, ALBUMIN in the last 168 hours. No results for input(s): LIPASE, AMYLASE in the last 168 hours. No results for input(s): AMMONIA in the last 168 hours. Coagulation Profile: No results for input(s): INR, PROTIME in the last 168 hours. Cardiac Enzymes: No results for input(s): CKTOTAL, CKMB, CKMBINDEX, TROPONINI in the last 168 hours. BNP (last 3 results) No results for input(s): PROBNP in the  last 8760 hours. HbA1C: No results for input(s): HGBA1C in the last 72 hours. CBG: No results for input(s): GLUCAP in the last 168 hours. Lipid Profile: No results for input(s): CHOL, HDL, LDLCALC, TRIG, CHOLHDL, LDLDIRECT in the last 72 hours. Thyroid Function Tests: No results for input(s): TSH, T4TOTAL, FREET4, T3FREE, THYROIDAB in the last 72 hours. Anemia Panel: No results for input(s): VITAMINB12, FOLATE, FERRITIN, TIBC, IRON, RETICCTPCT in the last 72 hours. Urine analysis:    Component Value Date/Time   COLORURINE YELLOW 05/10/2020 1012   APPEARANCEUR TURBID (A) 05/10/2020 1012   LABSPEC >1.030 (H) 05/10/2020 1012   PHURINE 5.5 05/10/2020 1012   GLUCOSEU NEGATIVE 05/10/2020 1012   HGBUR TRACE (A) 05/10/2020 1012   HGBUR negative 01/31/2010 0926   BILIRUBINUR NEGATIVE 05/10/2020 1012   KETONESUR NEGATIVE 05/10/2020 1012   PROTEINUR NEGATIVE 05/10/2020 1012   UROBILINOGEN 0.2 01/31/2010 0926   NITRITE NEGATIVE 05/10/2020 1012   LEUKOCYTESUR TRACE (A) 05/10/2020 1012   Sepsis Labs: @LABRCNTIP (procalcitonin:4,lacticidven:4) ) Recent Results (from the past 240 hour(s))  Resp Panel by RT-PCR (Flu A&B, Covid) Nasopharyngeal Swab     Status: None   Collection Time: 02/25/21  8:00 PM   Specimen: Nasopharyngeal Swab; Nasopharyngeal(NP) swabs in vial transport medium  Result Value Ref Range Status   SARS Coronavirus 2 by RT PCR NEGATIVE NEGATIVE Final    Comment: (NOTE) SARS-CoV-2  target nucleic acids are NOT DETECTED.  The SARS-CoV-2 RNA is generally detectable in upper respiratory specimens during the acute phase of infection. The lowest concentration of SARS-CoV-2 viral copies this assay can detect is 138 copies/mL. A negative result does not preclude SARS-Cov-2 infection and should not be used as the sole basis for treatment or other patient management decisions. A negative result may occur with  improper specimen collection/handling, submission of specimen other than nasopharyngeal swab, presence of viral mutation(s) within the areas targeted by this assay, and inadequate number of viral copies(<138 copies/mL). A negative result must be combined with clinical observations, patient history, and epidemiological information. The expected result is Negative.  Fact Sheet for Patients:  EntrepreneurPulse.com.au  Fact Sheet for Healthcare Providers:  IncredibleEmployment.be  This test is no t yet approved or cleared by the [place] FDA and  has been authorized for detection and/or diagnosis of SARS-CoV-2 by FDA under an Emergency Use Authorization (EUA). This EUA will remain  in effect (meaning this test can be used) for the duration of the COVID-19 declaration under Section 564(b)(1) of the Act, 21 U.S.C.section 360bbb-3(b)(1), unless the authorization is terminated  or revoked sooner.       Influenza A by PCR NEGATIVE NEGATIVE Final   Influenza B by PCR NEGATIVE NEGATIVE Final    Comment: (NOTE) The Xpert Xpress SARS-CoV-2/FLU/RSV plus assay is intended as an aid in the diagnosis of influenza from Nasopharyngeal swab specimens and should not be used as a sole basis for treatment. Nasal washings and aspirates are unacceptable for Xpert Xpress SARS-CoV-2/FLU/RSV testing.  Fact Sheet for Patients: EntrepreneurPulse.com.au  Fact Sheet for Healthcare  Providers: IncredibleEmployment.be  This test is not yet approved or cleared by the [place] FDA and has been authorized for detection and/or diagnosis of SARS-CoV-2 by FDA under an Emergency Use Authorization (EUA). This EUA will remain in effect (meaning this test can be used) for the duration of the COVID-19 declaration under Section 564(b)(1) of the Act, 21 U.S.C. section 360bbb-3(b)(1), unless the authorization is terminated or revoked.  Performed at [facility], [address] [place], [place] [address]      Radiological Exams on Admission: DG Chest 2 View  Result Date: 02/25/2021 CLINICAL DATA:  Coughing and shortness of breath. EXAM: CHEST - 2 VIEW COMPARISON:  Chest PA Lat 04/11/2013. FINDINGS: The heart size and mediastinal contours are within normal limits. The lungs hyperexpanded with increased streaky opacity in the right middle lobe infrahilar area medially, which could be due to pneumonia or atelectasis, with adjacent eventration and chronic elevation of the anterior right diaphragm. The remaining lungs are clear. No pleural effusion is seen. Thoracic spondylosis. IMPRESSION: Right middle lobe infrahilar atelectasis or focal pneumonia. Pneumonia is favored due to the similar inspiration and lack of this finding on the prior study. Clinical correlation and radiographic follow-up recommended. Electronically Signed   By: Telford Nab M.D.   On: 02/25/2021 20:12    EKG: Independently reviewed.  A. fib rate controlled.  Assessment/Plan Principal Problem:   RML pneumonia Active Problems:   HYPERLIPIDEMIA   Essential hypertension   Asthma   OSA (obstructive sleep apnea)   CAP (community acquired pneumonia)    Community-acquired pneumonia -patient has been placed on ceftriaxone and Zithromax.  Check sputum cultures.  Closely monitor respiratory status. Asthma was found to be mildly wheezing.  Was given steroids in the ER.  At  the time of my exam wheezing is improved.  We will continue home inhalers and closely monitor. A. fib presently rate controlled patient was recently started on flecainide and is also on Cardizem and apixaban. Hypertension on ARB hydrochlorothiazide and Cardizem. Hypothyroidism on Synthroid. Sleep apnea on CPAP at bedtime. Mild hypokalemia could be from hydrochlorothiazide.  Please recheck.  Since patient has community-acquired pneumonia with multiple comorbidities and was hypoxic on ambulation will need close monitoring and inpatient status.   DVT prophylaxis: Apixaban. Code Status: Full code. Family Communication: Discussed with patient. Disposition Plan: Home. Consults called: None. Admission status: Inpatient.   Rise Patience MD [facility] Pager [phone].  If 7PM-7AM, please contact night-coverage [email or web address] Password [email or web address]  02/26/2021, 3:30 AM

## 2021-02-26 NOTE — Progress Notes (Addendum)
[facility] PROGRESS NOTE    Progress Note  Amy Stokes  ZDG:[id number] DOB: 08-26-1950 DOA: 02/25/2021 PCP: Burnis Medin, MD     Brief Narrative:   Amy Stokes is an 70 y.o. female past medical history of A. fib on flecainide and Eliquis, obstructive sleep apnea essential hypertension has been experiencing cough for the last 2 days.  Came into the ED was found to be hypoxic on ambulation and wheezing satting 87%.  Chest x-ray showed right lower lobe pneumonia SARS-CoV-2 and flu PCR were negative.    Assessment/Plan:   Acute respiratory failure with hypoxia due to RML pneumonia: Will start empirically on Rocephin and azithromycin. Culture data is pending. Still requiring 3 L of oxygen to keep saturations greater 95%. Out of bed to chair Continue to encourage incentive spirometry. Ambulate and check saturations with ambulation.  Mild asthma: Was found to be wheezing in the ED was started on steroids.  On my exam is improved.  Stop steroids.  Chronic atrial fibrillation: On diltiazem apixaban and flecainide. Currently rate controlled.  Essential hypertension: Blood pressure is well controlled, she appears to be dry on physical exam still with mild hyperkalemia and bicarb trending up. Give her a bolus of normal saline, hold HCTZ.  Hypothyroidism: Continue Synthroid.  Mild hypokalemia: Likely due to infarct from hydrochlorothiazide and decreased oral intake, hold hydrochlorothiazide started on oral potassium supplementation recheck a basic metabolic panel in the morning. Check a magnesium level, she is on flecainide try to keep potassium greater than 4 magnesium greater than 2.  DVT prophylaxis: Eliquis Family Communication:None Status is: Inpatient  Remains inpatient appropriate because: Acute respiratory failure with hypoxia due to community-acquired pneumonia treated with IV antibiotics with a low potassium and low magnesium on flecainide which need  to be repleted     Code Status:     Code Status Orders  (From admission, onward)           Start     Ordered   02/26/21 0329  Full code  Continuous        02/26/21 0329           Code Status History     Date Active Date Inactive Code Status Order ID Comments User Context   08/02/2013 1825 08/04/2013 2131 Full Code [id number]  Terrance Mass, MD Inpatient         IV Access:   Peripheral IV   Procedures and diagnostic studies:   DG Chest 2 View  Result Date: 02/25/2021 CLINICAL DATA:  Coughing and shortness of breath. EXAM: CHEST - 2 VIEW COMPARISON:  Chest PA Lat 04/11/2013. FINDINGS: The heart size and mediastinal contours are within normal limits. The lungs hyperexpanded with increased streaky opacity in the right middle lobe infrahilar area medially, which could be due to pneumonia or atelectasis, with adjacent eventration and chronic elevation of the anterior right diaphragm. The remaining lungs are clear. No pleural effusion is seen. Thoracic spondylosis. IMPRESSION: Right middle lobe infrahilar atelectasis or focal pneumonia. Pneumonia is favored due to the similar inspiration and lack of this finding on the prior study. Clinical correlation and radiographic follow-up recommended. Electronically Signed   By: Telford Nab M.D.   On: 02/25/2021 20:12     Medical Consultants:   None.   Subjective:    Amy Stokes relates her breathing is unchanged still short of breath especially with sitting up.  Objective:    Vitals:   02/25/21 2136 02/25/21 2233 02/25/21 2334  02/26/21 0513  BP:  (!) 112/55 (!) 129/58 (!) 128/54  Pulse:  67 86 77  Resp:  19 20 18   Temp:  99.1 F (37.3 C) 98.2 F (36.8 C) 97.8 F (36.6 C)  TempSrc:  Oral    SpO2: 97% 94% 100% 97%  Weight:      Height:       SpO2: 97 % O2 Flow Rate (L/min): 3 L/min   Intake/Output Summary (Last 24 hours) at 02/26/2021 0903 Last data filed at 02/26/2021 0300 Gross per 24 hour   Intake 100.17 ml  Output --  Net 100.17 ml   Filed Weights   02/25/21 1945  Weight: 136.1 kg    Exam: General exam: In no acute distress. Respiratory system: Good air movement and crackles bilaterally especially on the right lung base Cardiovascular system: S1 & S2 heard, RRR. No JVD. Gastrointestinal system: Abdomen is nondistended, soft and nontender.  Extremities: No pedal edema. Skin: No rashes, lesions or ulcers Psychiatry: Judgement and insight appear normal. Mood & affect appropriate.    Data Reviewed:    Labs: Basic Metabolic Panel: Recent Labs  Lab 02/25/21 2000 02/26/21 0421  NA 138 137  K 3.2* 3.3*  CL 96* 96*  CO2 32 34*  GLUCOSE 148* 182*  BUN 15 17  CREATININE 0.71 0.67  CALCIUM 9.9 8.8*   GFR Estimated Creatinine Clearance: 97.2 mL/min (by C-G formula based on SCr of 0.67 mg/dL). Liver Function Tests: Recent Labs  Lab 02/26/21 0421  AST 15  ALT 18  ALKPHOS 72  BILITOT 0.4  PROT 7.2  ALBUMIN 3.1*   No results for input(s): LIPASE, AMYLASE in the last 168 hours. No results for input(s): AMMONIA in the last 168 hours. Coagulation profile No results for input(s): INR, PROTIME in the last 168 hours. COVID-19 Labs  Recent Labs    02/25/21 2000  DDIMER 0.46    Lab Results  Component Value Date   SARSCOV2NAA NEGATIVE 02/25/2021    CBC: Recent Labs  Lab 02/25/21 2000 02/26/21 0421  WBC 12.0* 9.9  NEUTROABS 9.7* 9.4*  HGB 12.4 11.9*  HCT 39.1 38.1  MCV 87.3 89.9  PLT 272 257   Cardiac Enzymes: No results for input(s): CKTOTAL, CKMB, CKMBINDEX, TROPONINI in the last 168 hours. BNP (last 3 results) No results for input(s): PROBNP in the last 8760 hours. CBG: No results for input(s): GLUCAP in the last 168 hours. D-Dimer: Recent Labs    02/25/21 2000  DDIMER 0.46   Hgb A1c: No results for input(s): HGBA1C in the last 72 hours. Lipid Profile: No results for input(s): CHOL, HDL, LDLCALC, TRIG, CHOLHDL, LDLDIRECT in the  last 72 hours. Thyroid function studies: No results for input(s): TSH, T4TOTAL, T3FREE, THYROIDAB in the last 72 hours.  Invalid input(s): FREET3 Anemia work up: No results for input(s): VITAMINB12, FOLATE, FERRITIN, TIBC, IRON, RETICCTPCT in the last 72 hours. Sepsis Labs: Recent Labs  Lab 02/25/21 2000 02/25/21 2200 02/26/21 0421  WBC 12.0*  --  9.9  LATICACIDVEN  --  0.9  --    Microbiology Recent Results (from the past 240 hour(s))  Resp Panel by RT-PCR (Flu A&B, Covid) Nasopharyngeal Swab     Status: None   Collection Time: 02/25/21  8:00 PM   Specimen: Nasopharyngeal Swab; Nasopharyngeal(NP) swabs in vial transport medium  Result Value Ref Range Status   SARS Coronavirus 2 by RT PCR NEGATIVE NEGATIVE Final    Comment: (NOTE) SARS-CoV-2 target nucleic acids are NOT DETECTED.  The SARS-CoV-2 RNA is generally detectable in upper respiratory specimens during the acute phase of infection. The lowest concentration of SARS-CoV-2 viral copies this assay can detect is 138 copies/mL. A negative result does not preclude SARS-Cov-2 infection and should not be used as the sole basis for treatment or other patient management decisions. A negative result may occur with  improper specimen collection/handling, submission of specimen other than nasopharyngeal swab, presence of viral mutation(s) within the areas targeted by this assay, and inadequate number of viral copies(<138 copies/mL). A negative result must be combined with clinical observations, patient history, and epidemiological information. The expected result is Negative.  Fact Sheet for Patients:  EntrepreneurPulse.com.au  Fact Sheet for Healthcare Providers:  IncredibleEmployment.be  This test is no t yet approved or cleared by the [place] FDA and  has been authorized for detection and/or diagnosis of SARS-CoV-2 by FDA under an Emergency Use Authorization (EUA). This EUA will  remain  in effect (meaning this test can be used) for the duration of the COVID-19 declaration under Section 564(b)(1) of the Act, 21 U.S.C.section 360bbb-3(b)(1), unless the authorization is terminated  or revoked sooner.       Influenza A by PCR NEGATIVE NEGATIVE Final   Influenza B by PCR NEGATIVE NEGATIVE Final    Comment: (NOTE) The Xpert Xpress SARS-CoV-2/FLU/RSV plus assay is intended as an aid in the diagnosis of influenza from Nasopharyngeal swab specimens and should not be used as a sole basis for treatment. Nasal washings and aspirates are unacceptable for Xpert Xpress SARS-CoV-2/FLU/RSV testing.  Fact Sheet for Patients: EntrepreneurPulse.com.au  Fact Sheet for Healthcare Providers: IncredibleEmployment.be  This test is not yet approved or cleared by the [place] FDA and has been authorized for detection and/or diagnosis of SARS-CoV-2 by FDA under an Emergency Use Authorization (EUA). This EUA will remain in effect (meaning this test can be used) for the duration of the COVID-19 declaration under Section 564(b)(1) of the Act, 21 U.S.C. section 360bbb-3(b)(1), unless the authorization is terminated or revoked.  Performed at [facility], [place], [place] [address]      Medications:    apixaban  5 mg Oral BID   diltiazem  300 mg Oral Daily   flecainide  50 mg Oral BID   hydrochlorothiazide  25 mg Oral Daily   levothyroxine  100 mcg Oral Q0600   losartan  100 mg Oral QPM   mometasone-formoterol  2 puff Inhalation BID   potassium chloride  20 mEq Oral Daily   potassium chloride  20 mEq Oral Once   Continuous Infusions:    LOS: 1 day   Charlynne Cousins  [facility]  02/26/2021, 9:03 AM

## 2021-02-26 NOTE — Progress Notes (Signed)
Unable to give potassium this am or synthroid. Pyxis update til 0600 per pharmacy. At 0640 pt is still not in pyxis to pull pharmacy contacted approx [phone] to send. Not here at 0647 moved meds to 0730

## 2021-02-26 NOTE — Progress Notes (Signed)
SATURATION QUALIFICATIONS: (This note is used to comply with regulatory documentation for home oxygen)  Patient Saturations on Room Air at Rest = 90%  Patient Saturations on Room Air while Ambulating = 88%  Patient Saturations on 2 Liters of oxygen while Ambulating = 91%  Please briefly explain why patient needs home oxygen: 

## 2021-02-27 DIAGNOSIS — I4891 Unspecified atrial fibrillation: Secondary | ICD-10-CM

## 2021-02-27 DIAGNOSIS — I10 Essential (primary) hypertension: Secondary | ICD-10-CM

## 2021-02-27 DIAGNOSIS — E782 Mixed hyperlipidemia: Secondary | ICD-10-CM | POA: Diagnosis not present

## 2021-02-27 DIAGNOSIS — E876 Hypokalemia: Secondary | ICD-10-CM | POA: Diagnosis present

## 2021-02-27 DIAGNOSIS — I482 Chronic atrial fibrillation, unspecified: Secondary | ICD-10-CM | POA: Diagnosis present

## 2021-02-27 DIAGNOSIS — Z87891 Personal history of nicotine dependence: Secondary | ICD-10-CM

## 2021-02-27 DIAGNOSIS — Z7901 Long term (current) use of anticoagulants: Secondary | ICD-10-CM | POA: Diagnosis not present

## 2021-02-27 DIAGNOSIS — E039 Hypothyroidism, unspecified: Secondary | ICD-10-CM | POA: Diagnosis present

## 2021-02-27 LAB — BASIC METABOLIC PANEL
Anion gap: 5 (ref 5–15)
BUN: 24 mg/dL — ABNORMAL HIGH (ref 8–23)
CO2: 33 mmol/L — ABNORMAL HIGH (ref 22–32)
Calcium: 8.6 mg/dL — ABNORMAL LOW (ref 8.9–10.3)
Chloride: 97 mmol/L — ABNORMAL LOW (ref 98–111)
Creatinine, Ser: 0.64 mg/dL (ref 0.44–1.00)
GFR, Estimated: >60 mL/min (ref >60)
Glucose, Bld: 136 mg/dL — ABNORMAL HIGH (ref 70–99)
Potassium: 3.7 mmol/L (ref 3.5–5.1)
Sodium: 135 mmol/L (ref 135–145)

## 2021-02-27 MED ORDER — POTASSIUM CHLORIDE CRYS ER 20 MEQ PO TBCR
40.0000 meq | EXTENDED_RELEASE_TABLET | Freq: Once | ORAL | Status: AC
  Administered 2021-02-27: 40 meq via ORAL
  Filled 2021-02-27: qty 2

## 2021-02-27 MED ORDER — PREDNISONE 5 MG PO TABS
50.0000 mg | ORAL_TABLET | Freq: Every day | ORAL | Status: DC
  Administered 2021-02-28: 50 mg via ORAL
  Filled 2021-02-27: qty 2

## 2021-02-27 MED ORDER — METHYLPREDNISOLONE SODIUM SUCC 125 MG IJ SOLR
80.0000 mg | Freq: Once | INTRAMUSCULAR | Status: AC
  Administered 2021-02-27: 80 mg via INTRAVENOUS
  Filled 2021-02-27: qty 2

## 2021-02-27 MED ORDER — PSYLLIUM 95 % PO PACK
1.0000 | PACK | Freq: Every day | ORAL | Status: DC | PRN
  Administered 2021-02-27 – 2021-02-28 (×2): 1 via ORAL
  Filled 2021-02-27 (×2): qty 1

## 2021-02-27 MED ORDER — IPRATROPIUM-ALBUTEROL 0.5-2.5 (3) MG/3ML IN SOLN
3.0000 mL | Freq: Four times a day (QID) | RESPIRATORY_TRACT | Status: DC
  Administered 2021-02-27 – 2021-02-28 (×4): 3 mL via RESPIRATORY_TRACT
  Filled 2021-02-27 (×4): qty 3

## 2021-02-27 NOTE — Assessment & Plan Note (Signed)
Currently being replaced. Maintain K more than 4 secondary to flecainide therapy

## 2021-02-27 NOTE — Hospital Course (Signed)
11/28 admitted with complaints of shortness of breath.  Found to have asthma exacerbation as well as right mid lobe pneumonia. 11/30 started back on steroids.  Switch to oral antibiotics.

## 2021-02-27 NOTE — Assessment & Plan Note (Signed)
Continue Synthroid 100.

## 2021-02-27 NOTE — Progress Notes (Addendum)
[facility] Progress Note  Patient: Amy Stokes    YDX:[id number]  DOA: 02/25/2021    Date of Service: the patient was seen and examined on 02/27/2021  Brief hospital course: 11/28 admitted with complaints of shortness of breath.  Found to have asthma exacerbation as well as right mid lobe pneumonia. 11/30 started back on steroids.  Switch to oral antibiotics.  Assessment and Plan: * RML pneumonia Chest x-ray shows right middle lobe pneumonia. Likely this is viral in nature pretension currently on oral doxycycline.  Acute respiratory failure with hypoxia (HCC) Presents with cough and shortness of breath.  Influenza and SARS COVID virus negative.  D-dimer negative. Chest x-ray shows right middle lobe atelectasis versus focal pneumonia.  Leukocytosis on admission. 87% on admission on room air. 88% on room air currently. Likely secondary to asthma exacerbation  Asthma, chronic, unspecified asthma severity, with acute exacerbation Patient has bilateral expiratory wheezing. Suspect this is mild exacerbation of her asthma. Continue DuoNebs, continue steroids.  Continue antibiotic that was started for pneumonia.  Essential hypertension Blood pressure stable. On losartan.  And Cardizem.  Continue.  Holding HCTZ.  Hypokalemia Currently being replaced. Maintain K more than 4 secondary to flecainide therapy  Atrial fibrillation, chronic (HCC) On Cardizem, Eliquis, flecainide. Maintain potassium more than 4.  HYPERLIPIDEMIA Continue statin.  OSA (obstructive sleep apnea) On CPAP.  Continue.  MORBID OBESITY Body mass index is 45.61 kg/m.  Placing the patient at high risk of poor outcome.  Hypothyroidism Continue Synthroid 100.   Body mass index is 45.61 kg/m.        Subjective: Continues to have shortness of breath and cough.  No nausea no vomiting.  No chest pain.  No abdominal pain.  Report actually that the cough is more.  Objective: Tachycardic and  tachypneic.  Exam: General: Appear in mild distress, no Rash; Oral Mucosa Clear, moist. no Abnormal Neck Mass Or lumps, Conjunctiva normal  Cardiovascular: S1 and S2 Present, no Murmur, Respiratory: increased respiratory effort, Bilateral Air entry present and bilateral  Crackles, no wheezes Abdomen: Bowel Sound present, Soft and no tenderness Extremities: trace Pedal edema Neurology: alert and oriented to time, place, and person affect appropriate. no new focal deficit Gait not checked due to patient safety concerns    Data Reviewed: My review of labs, imaging, notes and other tests is significant for     potassium 3.7.  Will replace.  Disposition:  Status is: Inpatient  Remains inpatient appropriate because: Ongoing respiratory distress.  Requiring IV therapy and nebulizer therapy.   Family Communication: None at bedside.  DVT Prophylaxis:  apixaban (ELIQUIS) tablet 5 mg   Time spent: 35 minutes.   Author: Berle Mull  02/27/2021 1:57 PM  To reach On-call, see care teams to locate the attending and reach out via [email or web address]. Between 7PM-7AM, please contact night-coverage If you still have difficulty reaching the attending provider, please page the [facility] (Director on Call) for [facility] on amion for assistance.

## 2021-02-27 NOTE — Assessment & Plan Note (Signed)
Body mass index is 45.61 kg/m.  Placing the patient at high risk of poor outcome.

## 2021-02-27 NOTE — Assessment & Plan Note (Addendum)
Presents with cough and shortness of breath.  Influenza and SARS COVID virus negative.  D-dimer negative. Chest x-ray shows right middle lobe atelectasis versus focal pneumonia.  Leukocytosis on admission. 87% on admission on room air. Ambulated on room air. No o2 needs.  Likely secondary to asthma exacerbation

## 2021-02-27 NOTE — Assessment & Plan Note (Addendum)
Blood pressure stable. On losartan.  And Cardizem.  Continue HCTZ.

## 2021-02-27 NOTE — Assessment & Plan Note (Signed)
On CPAP.  Continue.

## 2021-02-27 NOTE — Assessment & Plan Note (Addendum)
Patient has bilateral expiratory wheezing. Suspect this is mild exacerbation of her asthma. Continue DuoNebs, will order nebulizer on discharge.  continue steroids.  Continue antibiotic that was started for pneumonia.

## 2021-02-27 NOTE — Assessment & Plan Note (Signed)
On Cardizem, Eliquis, flecainide. Maintain potassium more than 4.

## 2021-02-27 NOTE — Plan of Care (Signed)
  Problem: Activity: Goal: Ability to tolerate increased activity will improve Outcome: Progressing   Problem: Clinical Measurements: Goal: Ability to maintain a body temperature in the normal range will improve Outcome: Progressing   Problem: Respiratory: Goal: Ability to maintain adequate ventilation will improve Outcome: Progressing Goal: Ability to maintain a clear airway will improve Outcome: Progressing   Problem: Education: Goal: Knowledge of General Education information will improve Description: Including pain rating scale, medication(s)/side effects and non-pharmacologic comfort measures Outcome: Progressing   Problem: Health Behavior/Discharge Planning: Goal: Ability to manage health-related needs will improve Outcome: Progressing   Problem: Clinical Measurements: Goal: Ability to maintain clinical measurements within normal limits will improve Outcome: Progressing Goal: Will remain free from infection Outcome: Progressing Goal: Diagnostic test results will improve Outcome: Progressing Goal: Respiratory complications will improve Outcome: Progressing Goal: Cardiovascular complication will be avoided Outcome: Progressing   Problem: Activity: Goal: Risk for activity intolerance will decrease Outcome: Progressing   Problem: Nutrition: Goal: Adequate nutrition will be maintained Outcome: Progressing   Problem: Pain Managment: Goal: General experience of comfort will improve Outcome: Progressing

## 2021-02-27 NOTE — Assessment & Plan Note (Signed)
Continue statin. 

## 2021-02-27 NOTE — Assessment & Plan Note (Signed)
Chest x-ray shows right middle lobe pneumonia. Likely this is viral in nature pretension currently on oral doxycycline.

## 2021-02-28 ENCOUNTER — Telehealth: Payer: Self-pay | Admitting: Internal Medicine

## 2021-02-28 DIAGNOSIS — J9601 Acute respiratory failure with hypoxia: Secondary | ICD-10-CM | POA: Diagnosis not present

## 2021-02-28 DIAGNOSIS — J189 Pneumonia, unspecified organism: Secondary | ICD-10-CM | POA: Diagnosis not present

## 2021-02-28 LAB — BASIC METABOLIC PANEL
Anion gap: 6 (ref 5–15)
BUN: 22 mg/dL (ref 8–23)
CO2: 34 mmol/L — ABNORMAL HIGH (ref 22–32)
Calcium: 8.7 mg/dL — ABNORMAL LOW (ref 8.9–10.3)
Chloride: 97 mmol/L — ABNORMAL LOW (ref 98–111)
Creatinine, Ser: 0.69 mg/dL (ref 0.44–1.00)
GFR, Estimated: >60 mL/min (ref >60)
Glucose, Bld: 150 mg/dL — ABNORMAL HIGH (ref 70–99)
Potassium: 4.9 mmol/L (ref 3.5–5.1)
Sodium: 137 mmol/L (ref 135–145)

## 2021-02-28 LAB — MAGNESIUM: Magnesium: 2.3 mg/dL (ref 1.7–2.4)

## 2021-02-28 MED ORDER — DOXYCYCLINE HYCLATE 100 MG PO TABS: 100.0000 mg | ORAL_TABLET | Freq: Two times a day (BID) | ORAL | 0 refills | Status: AC

## 2021-02-28 MED ORDER — IPRATROPIUM-ALBUTEROL 0.5-2.5 (3) MG/3ML IN SOLN: 3.0000 mL | Freq: Four times a day (QID) | RESPIRATORY_TRACT | 0 refills | Status: AC | PRN

## 2021-02-28 MED ORDER — PREDNISONE 10 MG PO TABS: ORAL_TABLET | ORAL | 0 refills | Status: DC

## 2021-02-28 NOTE — Telephone Encounter (Signed)
Pt is calling and just got d/c for [facility] and per pt needs one wk post hospital follow

## 2021-02-28 NOTE — TOC Transition Note (Signed)
Transition of Care [facility]) - CM/SW Discharge Note  Patient Details  Name: Amy Stokes MRN: [id number] Date of Birth: Nov 21, 1950  [facility] CM/SW Contact:  Sherie Don, LCSW Phone Number: 02/28/2021, 1:40 PM  Clinical Narrative: Patient will need a nebulizer to discharge home. Referral made to [facility] with [employer]. [employer] to deliver nebulizer to patient's room. CSW updated patient and patient agreeable to waiting for the nebulizer to be delivered. TOC signing off.  Final next level of care: Home/Self Care Barriers to Discharge: Barriers Resolved  Patient Goals and CMS Choice Patient states their goals for this hospitalization and ongoing recovery are:: Discharge home with nebulizer CMS Medicare.gov Compare Post Acute Care list provided to:: Patient Choice offered to / list presented to : Patient  Discharge Plan and Services        DME Arranged: Nebulizer/meds DME Agency: [employer] Date DME Agency Contacted: 02/28/21 Time DME Agency Contacted: [id number] Representative spoke with at DME Agency: Zelphia Cairo  Readmission Risk Interventions No flowsheet data found.

## 2021-02-28 NOTE — Plan of Care (Signed)
  Problem: Activity: Goal: Ability to tolerate increased activity will improve Outcome: Adequate for Discharge   Problem: Clinical Measurements: Goal: Ability to maintain a body temperature in the normal range will improve Outcome: Adequate for Discharge   Problem: Respiratory: Goal: Ability to maintain adequate ventilation will improve Outcome: Adequate for Discharge Goal: Ability to maintain a clear airway will improve Outcome: Adequate for Discharge   Problem: Education: Goal: Knowledge of General Education information will improve Description: Including pain rating scale, medication(s)/side effects and non-pharmacologic comfort measures Outcome: Adequate for Discharge   Problem: Health Behavior/Discharge Planning: Goal: Ability to manage health-related needs will improve Outcome: Adequate for Discharge   Problem: Clinical Measurements: Goal: Ability to maintain clinical measurements within normal limits will improve Outcome: Adequate for Discharge Goal: Will remain free from infection Outcome: Adequate for Discharge Goal: Diagnostic test results will improve Outcome: Adequate for Discharge Goal: Respiratory complications will improve Outcome: Adequate for Discharge Goal: Cardiovascular complication will be avoided Outcome: Adequate for Discharge   Problem: Activity: Goal: Risk for activity intolerance will decrease Outcome: Adequate for Discharge   Problem: Nutrition: Goal: Adequate nutrition will be maintained Outcome: Adequate for Discharge   Problem: Pain Managment: Goal: General experience of comfort will improve Outcome: Adequate for Discharge

## 2021-02-28 NOTE — Discharge Summary (Signed)
Physician Discharge Summary   Patient name: Amy Stokes  Admit date:     02/25/2021  Discharge date: 02/28/2021  Discharge Physician: Berle Mull   PCP: Burnis Medin, MD   Recommendations at discharge: follow up a recommended  Discharge Diagnoses Principal Problem:   RML pneumonia Active Problems:   Asthma, chronic, unspecified asthma severity, with acute exacerbation   Acute respiratory failure with hypoxia (HCC)   Essential hypertension   Atrial fibrillation, chronic (HCC)   Hypokalemia   HYPERLIPIDEMIA   OSA (obstructive sleep apnea)   MORBID OBESITY   Hypothyroidism   Resolved Diagnoses Resolved Problems:   * No resolved hospital problems. [facility] Course   11/28 admitted with complaints of shortness of breath.  Found to have asthma exacerbation as well as right mid lobe pneumonia. 11/30 started back on steroids.  Switch to oral antibiotics.   * RML pneumonia Chest x-ray shows right middle lobe pneumonia. Likely this is viral in nature pretension currently on oral doxycycline.  Acute respiratory failure with hypoxia (HCC) Presents with cough and shortness of breath.  Influenza and SARS COVID virus negative.  D-dimer negative. Chest x-ray shows right middle lobe atelectasis versus focal pneumonia.  Leukocytosis on admission. 87% on admission on room air. Ambulated on room air. No o2 needs.  Likely secondary to asthma exacerbation  Asthma, chronic, unspecified asthma severity, with acute exacerbation Patient has bilateral expiratory wheezing. Suspect this is mild exacerbation of her asthma. Continue DuoNebs, will order nebulizer on discharge.  continue steroids.  Continue antibiotic that was started for pneumonia.  Essential hypertension Blood pressure stable. On losartan.  And Cardizem.  Continue HCTZ.  Hypokalemia Currently being replaced. Maintain K more than 4 secondary to flecainide therapy  Atrial fibrillation, chronic (HCC) On  Cardizem, Eliquis, flecainide. Maintain potassium more than 4.  HYPERLIPIDEMIA Continue statin.  OSA (obstructive sleep apnea) On CPAP.  Continue.  MORBID OBESITY Body mass index is 45.61 kg/m.  Placing the patient at high risk of poor outcome.  Hypothyroidism Continue Synthroid 100.      Procedures performed: non   Condition at discharge: good  Exam General: Appear in mild distress, no Rash; Oral Mucosa Clear, moist. no Abnormal Neck Mass Or lumps, Conjunctiva normal  Cardiovascular: S1 and S2 Present, no Murmur, Respiratory: increased respiratory effort, Bilateral Air entry present and bilateral  Crackles, bilateral wheezes Abdomen: Bowel Sound present, Soft and no tenderness Extremities: trace Pedal edema Neurology: alert and oriented to time, place, and person affect appropriate. no new focal deficit Gait not checked due to patient safety concerns     Disposition: Home  Discharge time: greater than 30 minutes.  Follow-up Information     Panosh, [name], MD. Schedule an appointment as soon as possible for a visit in 1 week(s).   Specialties: Internal Medicine, Pediatrics Contact information: [place] [address] [phone]                 Allergies as of 02/28/2021   No Known Allergies      Medication List     TAKE these medications    acetaminophen 650 MG CR tablet Commonly known as: TYLENOL Take 1,300 mg by mouth every 8 (eight) hours as needed for pain.   albuterol 108 (90 Base) MCG/ACT inhaler Commonly known as: ProAir HFA Inhale 2 puffs into the lungs every 6 (six) hours as needed for wheezing.   apixaban 5 MG Tabs tablet Commonly known as: ELIQUIS Take  1 tablet (5 mg total) by mouth 2 (two) times daily.   Cholecalciferol 50 MCG (2000 UT) Caps Take 1 capsule by mouth daily. PATIENT USING OIL AND NOT CAPSULES   diltiazem 300 MG 24 hr capsule Commonly known as: Cardizem CD Take 1 capsule (300 mg total) by  mouth daily.   doxycycline 100 MG tablet Commonly known as: VIBRA-TABS Take 1 tablet (100 mg total) by mouth 2 (two) times daily for 4 days.   flecainide 50 MG tablet Commonly known as: TAMBOCOR Take 1 tablet (50 mg total) by mouth 2 (two) times daily.   fluticasone-salmeterol 250-50 MCG/ACT Aepb Commonly known as: Advair Diskus Inhale 1 puff into the lungs in the morning and at bedtime.   hydrochlorothiazide 25 MG tablet Commonly known as: HYDRODIURIL TAKE 1 TABLET (25 MG TOTAL) BY MOUTH DAILY.   ipratropium-albuterol 0.5-2.5 (3) MG/3ML Soln Commonly known as: DUONEB Take 3 mLs by nebulization every 6 (six) hours as needed.   Klor-Con M10 10 MEQ tablet Generic drug: potassium chloride TAKE 2 TABLETS BY MOUTH DAILY What changed:  how much to take how to take this   levothyroxine 100 MCG tablet Commonly known as: SYNTHROID Take 100 mcg by mouth every morning.   losartan 100 MG tablet Commonly known as: COZAAR TAKE 1 TABLET BY MOUTH EVERY DAY What changed: when to take this   Lutein 10 MG Tabs Take 10 mg by mouth daily.   MULTIPLE VITAMIN PO Take 1 tablet by mouth daily.   predniSONE 10 MG tablet Commonly known as: DELTASONE Take 40mg  daily for 3days,Take 30mg  daily for 3days,Take 20mg  daily for 3days,Take 10mg  daily for 3days, then stop   PSYLLIUM PO Take 1 packet by mouth daily.               Durable Medical Equipment  (From admission, onward)           Start     Ordered   02/28/21 1310  For home use only DME Nebulizer/meds  Once       Question Answer Comment  Patient needs a nebulizer to treat with the following condition Asthma, chronic, unspecified asthma severity, with acute exacerbation   Length of Need Lifetime      02/28/21 1310            DG Chest 2 View  Result Date: 02/25/2021 CLINICAL DATA:  Coughing and shortness of breath. EXAM: CHEST - 2 VIEW COMPARISON:  Chest PA Lat 04/11/2013. FINDINGS: The heart size and mediastinal  contours are within normal limits. The lungs hyperexpanded with increased streaky opacity in the right middle lobe infrahilar area medially, which could be due to pneumonia or atelectasis, with adjacent eventration and chronic elevation of the anterior right diaphragm. The remaining lungs are clear. No pleural effusion is seen. Thoracic spondylosis. IMPRESSION: Right middle lobe infrahilar atelectasis or focal pneumonia. Pneumonia is favored due to the similar inspiration and lack of this finding on the prior study. Clinical correlation and radiographic follow-up recommended. Electronically Signed   By: Telford Nab M.D.   On: 02/25/2021 20:12   Results for orders placed or performed during the hospital encounter of 02/25/21  Resp Panel by RT-PCR (Flu A&B, Covid) Nasopharyngeal Swab     Status: None   Collection Time: 02/25/21  8:00 PM   Specimen: Nasopharyngeal Swab; Nasopharyngeal(NP) swabs in vial transport medium  Result Value Ref Range Status   SARS Coronavirus 2 by RT PCR NEGATIVE NEGATIVE Final    Comment: (NOTE) SARS-CoV-2  target nucleic acids are NOT DETECTED.  The SARS-CoV-2 RNA is generally detectable in upper respiratory specimens during the acute phase of infection. The lowest concentration of SARS-CoV-2 viral copies this assay can detect is 138 copies/mL. A negative result does not preclude SARS-Cov-2 infection and should not be used as the sole basis for treatment or other patient management decisions. A negative result may occur with  improper specimen collection/handling, submission of specimen other than nasopharyngeal swab, presence of viral mutation(s) within the areas targeted by this assay, and inadequate number of viral copies(<138 copies/mL). A negative result must be combined with clinical observations, patient history, and epidemiological information. The expected result is Negative.  Fact Sheet for Patients:  EntrepreneurPulse.com.au  Fact Sheet  for Healthcare Providers:  IncredibleEmployment.be  This test is no t yet approved or cleared by the [place] FDA and  has been authorized for detection and/or diagnosis of SARS-CoV-2 by FDA under an Emergency Use Authorization (EUA). This EUA will remain  in effect (meaning this test can be used) for the duration of the COVID-19 declaration under Section 564(b)(1) of the Act, 21 U.S.C.section 360bbb-3(b)(1), unless the authorization is terminated  or revoked sooner.       Influenza A by PCR NEGATIVE NEGATIVE Final   Influenza B by PCR NEGATIVE NEGATIVE Final    Comment: (NOTE) The Xpert Xpress SARS-CoV-2/FLU/RSV plus assay is intended as an aid in the diagnosis of influenza from Nasopharyngeal swab specimens and should not be used as a sole basis for treatment. Nasal washings and aspirates are unacceptable for Xpert Xpress SARS-CoV-2/FLU/RSV testing.  Fact Sheet for Patients: EntrepreneurPulse.com.au  Fact Sheet for Healthcare Providers: IncredibleEmployment.be  This test is not yet approved or cleared by the [place] FDA and has been authorized for detection and/or diagnosis of SARS-CoV-2 by FDA under an Emergency Use Authorization (EUA). This EUA will remain in effect (meaning this test can be used) for the duration of the COVID-19 declaration under Section 564(b)(1) of the Act, 21 U.S.C. section 360bbb-3(b)(1), unless the authorization is terminated or revoked.  Performed at [facility], [address] [place], [place] [address]   Blood culture (routine x 2)     Status: None (Preliminary result)   Collection Time: 02/25/21 10:00 PM   Specimen: BLOOD  Result Value Ref Range Status   Specimen Description   Final    BLOOD BOTTLES DRAWN AEROBIC AND ANAEROBIC Performed at [facility], [address], [place], [place] [address]    Special Requests   Final    Blood  Culture results may not be optimal due to an excessive volume of blood received in culture bottles RIGHT ANTECUBITAL Performed at [facility], [address] [place], [place] [address]    Culture   Final    NO GROWTH 2 DAYS Performed at [facility], [place] [address], [place], [place] [address]    Report Status PENDING  Incomplete  Blood culture (routine x 2)     Status: None (Preliminary result)   Collection Time: 02/25/21 10:00 PM   Specimen: BLOOD  Result Value Ref Range Status   Specimen Description   Final    BLOOD BOTTLES DRAWN AEROBIC AND ANAEROBIC Performed at [facility], [address] [place], [place] [address]    Special Requests   Final    Blood Culture adequate volume BLOOD RIGHT FOREARM Performed at [facility], [address] [place], [place] [address]    Culture  Final    NO GROWTH 2 DAYS Performed at [facility], [place] [address], [place], [place] [address]    Report Status PENDING  Incomplete    Signed:  Berle Mull MD.  [facility] 02/28/2021, 1:55 PM

## 2021-03-03 LAB — CULTURE, BLOOD (ROUTINE X 2)
Culture: NO GROWTH
Culture: NO GROWTH
Special Requests: ADEQUATE

## 2021-03-04 DIAGNOSIS — E049 Nontoxic goiter, unspecified: Secondary | ICD-10-CM | POA: Diagnosis not present

## 2021-03-04 DIAGNOSIS — R7301 Impaired fasting glucose: Secondary | ICD-10-CM | POA: Diagnosis not present

## 2021-03-04 DIAGNOSIS — E02 Subclinical iodine-deficiency hypothyroidism: Secondary | ICD-10-CM | POA: Diagnosis not present

## 2021-03-05 ENCOUNTER — Telehealth (HOSPITAL_BASED_OUTPATIENT_CLINIC_OR_DEPARTMENT_OTHER): Payer: Self-pay | Admitting: Cardiology

## 2021-03-05 ENCOUNTER — Other Ambulatory Visit: Payer: Self-pay | Admitting: Endocrinology

## 2021-03-05 DIAGNOSIS — E049 Nontoxic goiter, unspecified: Secondary | ICD-10-CM

## 2021-03-05 NOTE — Telephone Encounter (Signed)
Patient c/o Palpitations:  High priority if patient c/o lightheadedness, shortness of breath, or chest pain  How long have you had palpitations/irregular HR/ Afib? Are you having the symptoms now? yes  Are you currently experiencing lightheadedness, SOB or CP? no  Do you have a history of afib (atrial fibrillation) or irregular heart rhythm? yes  Have you checked your BP or HR? (document readings if available): HR 78  Are you experiencing any other symptoms? no

## 2021-03-05 NOTE — Telephone Encounter (Signed)
Called pt she states she was in the hospital last week Mon-Thurs for pneumonia, now dealing with a flare up of her thyroid. Seen endo yesterday - ultrasound of her thyroid,. Discomfort of her throat, Right side inflamed. Pt has been taken Flecainide since Nov. "No episodes of a-fib until yesterday. Fit bit showed a-fib from 6:25 am - 8:50 am." Denies SOB, chest pain, and all other symptoms. She states "I do feel kind of weird with this thyroid thing so it is kind of hard to tell what is what." Pt will wait until after the ultrasound to make an appointment with Dr. Harrell Gave. Pt states "I might have to have a biopsy, we will take one step at a time."  Will relay message to Dr. Harrell Gave and her nurse.

## 2021-03-05 NOTE — Telephone Encounter (Signed)
I already spoke to her today.

## 2021-03-05 NOTE — Telephone Encounter (Signed)
Pt has been scheduled on 03/07/2021 with Dr. Elease Hashimoto

## 2021-03-05 NOTE — Telephone Encounter (Signed)
   Pt said she missed a call from and she wants to know if the nurse trying to call her back

## 2021-03-05 NOTE — Telephone Encounter (Signed)
Hey did you try to call this patient?

## 2021-03-08 ENCOUNTER — Ambulatory Visit (INDEPENDENT_AMBULATORY_CARE_PROVIDER_SITE_OTHER): Payer: Medicare HMO | Admitting: Family Medicine

## 2021-03-08 VITALS — BP 140/60 | HR 97 | Temp 98.0°F | Wt 287.8 lb

## 2021-03-08 DIAGNOSIS — J189 Pneumonia, unspecified organism: Secondary | ICD-10-CM

## 2021-03-08 NOTE — Patient Instructions (Signed)
Return in 2-3 weeks for repeat CXR.    Follow up sooner for any fever or other concerns.

## 2021-03-08 NOTE — Progress Notes (Signed)
Established Patient Office Visit  Subjective:  Patient ID: Amy Stokes, female    DOB: 1950/04/19  Age: 70 y.o. MRN: [id number]  CC:  Chief Complaint  Patient presents with   Hospitalization Follow-up    HPI Amy Stokes presents for hospital follow-up for recent admission for right middle lobe pneumonia.  She has history of asthma.  She states she had some coughing she thinks in summertime.  She developed some increasing dyspnea and low-grade fever prior to admission.  She was admitted on 28 November and discharged December 1.  X-ray showed right middle lobe pneumonia.  White count 12,000.  She was treated with IV antibiotics and transition to oral antibiotics with doxycycline.  Also was given steroids.  She does have diabetes followed by endocrinology and thankfully her blood sugars did not go up much with the prednisone.  Her D-dimer was negative.  She states she feels pretty much back to baseline at this time.  No recurrent fever.  She had initial hypoxemia and recent O2 sats have been relatively normal.  She has already resumed water aerobics.  She has rescue inhaler at home takes Advair regularly.  Never smoked.  Also has obstructive sleep apnea on CPAP.  Has DuoNeb but not requiring use recently.  Past Medical History:  Diagnosis Date   Arthritis    no meds   Asthma    Edema    Fibroids    Hyperlipidemia    Hypertension    Obesity    Sleep apnea    wears cpap    Thyroid nodule 01/2009   left discovered on chest ct; needle bx "indeterminant" cannon r/o follicular neoplasm     Past Surgical History:  Procedure Laterality Date   ABDOMINAL HYSTERECTOMY Bilateral 08/02/2013   Procedure: TOTAL ABDOMINAL HYSTERECTOMY WITH BILATERAL SALPINGO-OOPHORECTOMY ;  Surgeon: Terrance Mass, MD;  Location: [facility];  Service: Gynecology;  Laterality: Bilateral;    Dr. Phineas Real to assist.  Dr. Zella Richer to follow with an umbilical hernia repair. He will need an additional 1 1/2  hours.    COLONOSCOPY  12/27/2007   [facility]    HERNIA REPAIR     MOUTH SURGERY     thyroid needle bx     TONSILLECTOMY     UMBILICAL HERNIA REPAIR N/A 08/02/2013   Procedure: HERNIA REPAIR UMBILICAL ADULT WITH MESH;  Surgeon: Odis Hollingshead, MD;  Location: [facility];  Service: General;  Laterality: N/A;    Family History  Problem Relation Age of Onset   Arthritis Mother    Other Mother        neurological disorder   Pancreatic cancer Mother    Heart disease Father    Thyroid nodules Sister    Heart disease Brother        x2 1 brother deceased   Colon cancer Neg Hx    Colon polyps Neg Hx    Esophageal cancer Neg Hx    Rectal cancer Neg Hx    Stomach cancer Neg Hx     Social History   Socioeconomic History   Marital status: Married    Spouse name: Not on file   Number of children: 0   Years of education: Not on file   Highest education level: Not on file  Occupational History   Occupation: retired  Tobacco Use   Smoking status: Former    Packs/day: 1.00    Years: 1.00    Pack years: 1.00    Types: Cigarettes  Quit date: 04/29/1971    Years since quitting: 49.8   Smokeless tobacco: Never  Vaping Use   Vaping Use: Never used  Substance and Sexual Activity   Alcohol use: Yes    Alcohol/week: 0.0 standard drinks    Comment: occasionally   Drug use: No   Sexual activity: Not on file  Other Topics Concern   Not on file  Social History Narrative   Married   Occupation: Chiropractor currently unemployed   Married   Regular exercise-no   [place] of 2   Pet cat      Social Determinants of Health   Financial Resource Strain: Low Risk    Difficulty of Paying Living Expenses: Not hard at all  Food Insecurity: No Food Insecurity   Worried About Charity fundraiser in the Last Year: Never true   Arboriculturist in the Last Year: Never true  Transportation Needs: No Transportation Needs   Lack of Transportation (Medical): No   Lack of Transportation  (Non-Medical): No  Physical Activity: Sufficiently Active   Days of Exercise per Week: 3 days   Minutes of Exercise per Session: 90 min  Stress: No Stress Concern Present   Feeling of Stress : Not at all  Social Connections: Moderately Integrated   Frequency of Communication with Friends and Family: More than three times a week   Frequency of Social Gatherings with Friends and Family: Three times a week   Attends Religious Services: Never   Active Member of Clubs or Organizations: Yes   Attends Archivist Meetings: 1 to 4 times per year   Marital Status: Married  Human resources officer Violence: Not At Risk   Fear of Current or Ex-Partner: No   Emotionally Abused: No   Physically Abused: No   Sexually Abused: No    Outpatient Medications Prior to Visit  Medication Sig Dispense Refill   acetaminophen (TYLENOL) 650 MG CR tablet Take 1,300 mg by mouth every 8 (eight) hours as needed for pain.     albuterol (PROAIR HFA) 108 (90 Base) MCG/ACT inhaler Inhale 2 puffs into the lungs every 6 (six) hours as needed for wheezing. 1 each 2   apixaban (ELIQUIS) 5 MG TABS tablet Take 1 tablet (5 mg total) by mouth 2 (two) times daily. 180 tablet 3   Cholecalciferol 50 MCG (2000 UT) CAPS Take 1 capsule by mouth daily. PATIENT USING OIL AND NOT CAPSULES     diltiazem (CARDIZEM CD) 300 MG 24 hr capsule Take 1 capsule (300 mg total) by mouth daily. 30 capsule 2   flecainide (TAMBOCOR) 50 MG tablet Take 1 tablet (50 mg total) by mouth 2 (two) times daily. 60 tablet 3   fluticasone-salmeterol (ADVAIR DISKUS) 250-50 MCG/ACT AEPB Inhale 1 puff into the lungs in the morning and at bedtime. 60 each 11   hydrochlorothiazide (HYDRODIURIL) 25 MG tablet TAKE 1 TABLET (25 MG TOTAL) BY MOUTH DAILY. 90 tablet 0   ipratropium-albuterol (DUONEB) 0.5-2.5 (3) MG/3ML SOLN Take 3 mLs by nebulization every 6 (six) hours as needed. 360 mL 0   KLOR-CON M10 10 MEQ tablet TAKE 2 TABLETS BY MOUTH DAILY (Patient taking  differently: 20 mEq daily.) 180 tablet 0   levothyroxine (SYNTHROID) 100 MCG tablet Take 100 mcg by mouth every morning.     losartan (COZAAR) 100 MG tablet TAKE 1 TABLET BY MOUTH EVERY DAY (Patient taking differently: Take 100 mg by mouth every evening.) 90 tablet 1   Lutein 10 MG  TABS Take 10 mg by mouth daily.     MULTIPLE VITAMIN PO Take 1 tablet by mouth daily.     predniSONE (DELTASONE) 10 MG tablet Take 19m daily for 3days,Take 348mdaily for 3days,Take 2056maily for 3days,Take 7m71mily for 3days, then stop 30 tablet 0   PSYLLIUM PO Take 1 packet by mouth daily.     No facility-administered medications prior to visit.    No Known Allergies  ROS Review of Systems  Constitutional:  Negative for chills and fever.  Respiratory:  Negative for cough, shortness of breath and wheezing.   Cardiovascular:  Negative for chest pain.  Genitourinary:  Negative for dysuria.  Neurological:  Negative for dizziness.     Objective:    Physical Exam Vitals reviewed.  Constitutional:      Appearance: Normal appearance.  Cardiovascular:     Rate and Rhythm: Normal rate and regular rhythm.  Pulmonary:     Effort: Pulmonary effort is normal.     Breath sounds: Normal breath sounds. No wheezing or rales.  Musculoskeletal:     Right lower leg: No edema.     Left lower leg: No edema.  Neurological:     Mental Status: She is alert.    BP 140/60 (BP Location: Left Arm, Patient Position: Sitting, Cuff Size: Normal)   Pulse 97   Temp 98 F (36.7 C) (Oral)   Wt 287 lb 12.8 oz (130.5 kg)   SpO2 96%   BMI 43.76 kg/m  Wt Readings from Last 3 Encounters:  03/08/21 287 lb 12.8 oz (130.5 kg)  02/25/21 300 lb (136.1 kg)  02/18/21 (!) 304 lb 11.2 oz (138.2 kg)     Health Maintenance Due  Topic Date Due   PAP SMEAR-Modifier  09/29/2015    There are no preventive care reminders to display for this patient.  Lab Results  Component Value Date   TSH 2.00 10/12/2020   Lab Results   Component Value Date   WBC 9.9 02/26/2021   HGB 11.9 (L) 02/26/2021   HCT 38.1 02/26/2021   MCV 89.9 02/26/2021   PLT 257 02/26/2021   Lab Results  Component Value Date   NA 137 02/28/2021   K 4.9 02/28/2021   CO2 34 (H) 02/28/2021   GLUCOSE 150 (H) 02/28/2021   BUN 22 02/28/2021   CREATININE 0.69 02/28/2021   BILITOT 0.4 02/26/2021   ALKPHOS 72 02/26/2021   AST 15 02/26/2021   ALT 18 02/26/2021   PROT 7.2 02/26/2021   ALBUMIN 3.1 (L) 02/26/2021   CALCIUM 8.7 (L) 02/28/2021   ANIONGAP 6 02/28/2021   EGFR 88 01/10/2021   GFR 90.27 07/02/2020   Lab Results  Component Value Date   CHOL 174 07/02/2020   Lab Results  Component Value Date   HDL 39.30 07/02/2020   Lab Results  Component Value Date   LDLCALC 116 (H) 07/02/2020   Lab Results  Component Value Date   TRIG 95.0 07/02/2020   Lab Results  Component Value Date   CHOLHDL 4 07/02/2020   Lab Results  Component Value Date   HGBA1C 5.7 (A) 10/24/2020      Assessment & Plan:   Problem List Items Addressed This Visit   None Visit Diagnoses     Community acquired pneumonia of right middle lobe of lung    -  Primary   Relevant Orders   DG Chest 2 View     Patient had recent admission for community-acquired right middle lobe  pneumonia.  Treated with oral antibiotic with doxycycline and she has finished prednisone taper.  Clinically improved at this time.  Repeat pulse oximetry 96%  -We have recommended repeat chest x-ray in a couple weeks to document clearing and this was scheduled.  Follow-up immediately for any recurrent fever or any recurrent shortness of breath. -Flu vaccine already given  She has chronic asthma with recent exacerbation.  Continue Advair and as needed use of albuterol.  No orders of the defined types were placed in this encounter.   Follow-up: No follow-ups on file.    Carolann Littler, MD

## 2021-03-13 ENCOUNTER — Telehealth: Payer: Self-pay | Admitting: *Deleted

## 2021-03-13 NOTE — Telephone Encounter (Signed)
Transition Care Management Follow-up Telephone Call Date of discharge and from where: 02/28/21  [facility] How have you been since you were released from the hospital? "Fully recovered and feeling good" Any questions or concerns? No  Items Reviewed: Did the pt receive and understand the discharge instructions provided? Yes  Medications obtained and verified? Yes  Other? No  Any new allergies since your discharge? No  Dietary orders reviewed? Yes Do you have support at home? Yes   Home Care and Equipment/Supplies: Were home health services ordered? no If so, what is the name of the agency? N/a  Has the agency set up a time to come to the patient's home? not applicable Were any new equipment or medical supplies ordered?  Yes:   What is the name of the medical supply agency? unsure Were you able to get the supplies/equipment? Yes- nebulizer machine was delivered to hospital before pt was discharged Do you have any questions related to the use of the equipment or supplies? No  Functional Questionnaire: (I = Independent and D = Dependent) ADLs: I  Bathing/Dressing- I  Meal Prep- I  Eating- I  Maintaining continence- I  Transferring/Ambulation- I  Managing Meds- I  Follow up appointments reviewed:  PCP Hospital f/u appt confirmed? Yes  Saw primary care on 03/08/21. [facility] f/u appt confirmed? No   Are transportation arrangements needed? No  If their condition worsens, is the pt aware to call PCP or go to the Emergency Dept.? Yes Was the patient provided with contact information for the PCP's office or ED? Yes Was to pt encouraged to call back with questions or concerns? Yes  Jacqlyn Larsen [facility], BSN RN Case Manager [phone]

## 2021-03-18 ENCOUNTER — Other Ambulatory Visit: Payer: Self-pay

## 2021-03-18 ENCOUNTER — Other Ambulatory Visit: Payer: Medicare HMO

## 2021-03-18 ENCOUNTER — Ambulatory Visit (INDEPENDENT_AMBULATORY_CARE_PROVIDER_SITE_OTHER): Payer: Medicare HMO

## 2021-03-18 DIAGNOSIS — J189 Pneumonia, unspecified organism: Secondary | ICD-10-CM

## 2021-03-19 ENCOUNTER — Ambulatory Visit
Admission: RE | Admit: 2021-03-19 | Discharge: 2021-03-19 | Disposition: A | Discharge status: Home / Self Care | Payer: Medicare HMO | Source: Ambulatory Visit | Attending: Endocrinology | Admitting: Endocrinology

## 2021-03-19 DIAGNOSIS — E042 Nontoxic multinodular goiter: Secondary | ICD-10-CM | POA: Diagnosis not present

## 2021-03-19 DIAGNOSIS — E049 Nontoxic goiter, unspecified: Secondary | ICD-10-CM

## 2021-03-20 DIAGNOSIS — R69 Illness, unspecified: Secondary | ICD-10-CM | POA: Diagnosis not present

## 2021-03-20 DIAGNOSIS — Z124 Encounter for screening for malignant neoplasm of cervix: Secondary | ICD-10-CM | POA: Diagnosis not present

## 2021-03-20 DIAGNOSIS — Z6841 Body Mass Index (BMI) 40.0 and over, adult: Secondary | ICD-10-CM | POA: Diagnosis not present

## 2021-03-20 LAB — HM PAP SMEAR
HM Pap smear: NEGATIVE
HPV, high-risk: NEGATIVE

## 2021-03-31 ENCOUNTER — Other Ambulatory Visit: Payer: Self-pay | Admitting: Internal Medicine

## 2021-04-04 ENCOUNTER — Other Ambulatory Visit: Payer: Self-pay | Admitting: Family Medicine

## 2021-04-04 ENCOUNTER — Other Ambulatory Visit (HOSPITAL_BASED_OUTPATIENT_CLINIC_OR_DEPARTMENT_OTHER): Payer: Self-pay | Admitting: Family

## 2021-04-04 DIAGNOSIS — I480 Paroxysmal atrial fibrillation: Secondary | ICD-10-CM

## 2021-04-15 ENCOUNTER — Encounter (HOSPITAL_BASED_OUTPATIENT_CLINIC_OR_DEPARTMENT_OTHER): Payer: Self-pay

## 2021-04-15 ENCOUNTER — Other Ambulatory Visit (HOSPITAL_BASED_OUTPATIENT_CLINIC_OR_DEPARTMENT_OTHER): Payer: Self-pay

## 2021-04-15 ENCOUNTER — Encounter: Payer: Self-pay | Admitting: Internal Medicine

## 2021-04-15 ENCOUNTER — Encounter: Payer: Self-pay | Admitting: Pulmonary Disease

## 2021-04-16 ENCOUNTER — Other Ambulatory Visit (HOSPITAL_BASED_OUTPATIENT_CLINIC_OR_DEPARTMENT_OTHER): Payer: Self-pay

## 2021-04-16 MED ORDER — KETOROLAC TROMETHAMINE 0.5 % OP SOLN: 1.0000 [drp] | Freq: Two times a day (BID) | OPHTHALMIC | 0 refills | Status: DC

## 2021-04-16 MED ORDER — HYDROCHLOROTHIAZIDE 12.5 MG PO CAPS: 12.5000 mg | ORAL_CAPSULE | Freq: Every day | ORAL | 0 refills | Status: DC

## 2021-04-16 MED ORDER — VALACYCLOVIR HCL 500 MG PO TABS: 500.0000 mg | ORAL_TABLET | Freq: Two times a day (BID) | ORAL | 4 refills | Status: AC

## 2021-04-16 MED ORDER — FLUTICASONE-SALMETEROL 100-50 MCG/ACT IN AEPB: 1.0000 | INHALATION_SPRAY | Freq: Two times a day (BID) | RESPIRATORY_TRACT | 3 refills | Status: DC

## 2021-04-16 MED ORDER — FLUTICASONE-SALMETEROL 100-50 MCG/ACT IN AEPB: INHALATION_SPRAY | RESPIRATORY_TRACT | 0 refills | Status: DC

## 2021-04-16 MED ORDER — HYDROCHLOROTHIAZIDE 12.5 MG PO CAPS: 12.5000 mg | ORAL_CAPSULE | Freq: Two times a day (BID) | ORAL | 0 refills | Status: DC

## 2021-04-16 MED ORDER — LEVOTHYROXINE SODIUM 100 MCG PO TABS
ORAL_TABLET | ORAL | 4 refills | Status: AC
  Filled 2021-04-16: qty 90, 90d supply, fill #0

## 2021-04-16 MED ORDER — LEVOTHYROXINE SODIUM 100 MCG PO TABS
100.0000 ug | ORAL_TABLET | Freq: Every morning | ORAL | 2 refills | Status: DC
Start: 2020-12-18 — End: 2021-05-07

## 2021-04-16 NOTE — Telephone Encounter (Signed)
Patient would like any future medication sent to [place] [facility]!

## 2021-04-17 ENCOUNTER — Other Ambulatory Visit (HOSPITAL_BASED_OUTPATIENT_CLINIC_OR_DEPARTMENT_OTHER): Payer: Self-pay

## 2021-04-17 MED FILL — Losartan Potassium Tab 100 MG: ORAL | 90 days supply | Qty: 90 | Fill #0 | Status: AC

## 2021-04-18 ENCOUNTER — Encounter (HOSPITAL_BASED_OUTPATIENT_CLINIC_OR_DEPARTMENT_OTHER): Payer: Self-pay

## 2021-04-18 ENCOUNTER — Encounter: Payer: Self-pay | Admitting: Pulmonary Disease

## 2021-04-18 ENCOUNTER — Other Ambulatory Visit (HOSPITAL_BASED_OUTPATIENT_CLINIC_OR_DEPARTMENT_OTHER): Payer: Self-pay

## 2021-04-18 ENCOUNTER — Encounter: Payer: Self-pay | Admitting: Internal Medicine

## 2021-05-04 ENCOUNTER — Other Ambulatory Visit: Payer: Self-pay | Admitting: Internal Medicine

## 2021-05-07 ENCOUNTER — Encounter: Payer: Self-pay | Admitting: Internal Medicine

## 2021-05-07 ENCOUNTER — Ambulatory Visit (INDEPENDENT_AMBULATORY_CARE_PROVIDER_SITE_OTHER): Payer: Medicare HMO | Admitting: Internal Medicine

## 2021-05-07 ENCOUNTER — Telehealth: Payer: Self-pay | Admitting: Internal Medicine

## 2021-05-07 VITALS — BP 134/76 | HR 79 | Temp 98.5°F | Ht 68.0 in | Wt 287.0 lb

## 2021-05-07 DIAGNOSIS — J45909 Unspecified asthma, uncomplicated: Secondary | ICD-10-CM | POA: Diagnosis not present

## 2021-05-07 DIAGNOSIS — I482 Chronic atrial fibrillation, unspecified: Secondary | ICD-10-CM | POA: Diagnosis not present

## 2021-05-07 DIAGNOSIS — Z9989 Dependence on other enabling machines and devices: Secondary | ICD-10-CM | POA: Diagnosis not present

## 2021-05-07 DIAGNOSIS — E042 Nontoxic multinodular goiter: Secondary | ICD-10-CM

## 2021-05-07 DIAGNOSIS — Z92240 Personal history of inhaled steroid therapy: Secondary | ICD-10-CM

## 2021-05-07 DIAGNOSIS — G4733 Obstructive sleep apnea (adult) (pediatric): Secondary | ICD-10-CM

## 2021-05-07 DIAGNOSIS — R6889 Other general symptoms and signs: Secondary | ICD-10-CM

## 2021-05-07 NOTE — Progress Notes (Signed)
Chief Complaint  Patient presents with   throat irritation    HPI: Amy Stokes 71 y.o. come in for new problem  On going off and on  since pneumonia  dx   December  .  Didn't have  airway manipualtion. Describes it as something caught in her throat and points to her upper neck.  Denies swallowing problems associated cough postnasal drainage may be more on the right than the left. Subsided since when first called .  Doing okay today when she gets that it may last for days. Under rx for subclinial thyroid disase dr Chalmers Cater  and nodules  last [place] 12 20 22.  Ha uncomfprtable  throat sx   was rx with prednisone and had improved.  Symptoms. Now going on for weeks.    At its worse feels  no wakening . No swallowing   problem  more on right .  Oxygenation at night has been 90-89 and not bad  Rinse his mouth out after using Advair and no history of thrush aggravated reflux. She is on rhythm control and anticoagulation for intermittent atrial fib.  Did have an episode this past weekend that lasted hours but did not feel bad rate was in the 90s follows up with cardiology in the near future.  ROS: See pertinent positives and negatives per HPI.  Past Medical History:  Diagnosis Date   Arthritis    no meds   Asthma    Edema    Fibroids    Hyperlipidemia    Hypertension    Obesity    RML pneumonia 02/25/2021   Sleep apnea    wears cpap    Thyroid nodule 01/2009   left discovered on chest ct; needle bx "indeterminant" cannon r/o follicular neoplasm     Family History  Problem Relation Age of Onset   Arthritis Mother    Other Mother        neurological disorder   Pancreatic cancer Mother    Heart disease Father    Thyroid nodules Sister    Heart disease Brother        x2 1 brother deceased   Colon cancer Neg Hx    Colon polyps Neg Hx    Esophageal cancer Neg Hx    Rectal cancer Neg Hx    Stomach cancer Neg Hx     Social History   Socioeconomic History   Marital status:  Married    Spouse name: Not on file   Number of children: 0   Years of education: Not on file   Highest education level: Associate degree: occupational, Hotel manager, or vocational program  Occupational History   Occupation: retired  Tobacco Use   Smoking status: Former    Packs/day: 1.00    Years: 1.00    Pack years: 1.00    Types: Cigarettes    Quit date: 04/29/1971    Years since quitting: 50.0   Smokeless tobacco: Never  Vaping Use   Vaping Use: Never used  Substance and Sexual Activity   Alcohol use: Yes    Alcohol/week: 0.0 standard drinks    Comment: occasionally   Drug use: No   Sexual activity: Not on file  Other Topics Concern   Not on file  Social History Narrative   Married   Occupation: Chiropractor currently unemployed   Married   Regular exercise-no   [place] of 2   Pet Neurosurgeon      Social Determinants of Engineer, drilling  Resource Strain: Low Risk    Difficulty of Paying Living Expenses: Not very hard  Food Insecurity: No Food Insecurity   Worried About Charity fundraiser in the Last Year: Never true   Ran Out of Food in the Last Year: Never true  Transportation Needs: No Transportation Needs   Lack of Transportation (Medical): No   Lack of Transportation (Non-Medical): No  Physical Activity: Sufficiently Active   Days of Exercise per Week: 3 days   Minutes of Exercise per Session: 80 min  Stress: No Stress Concern Present   Feeling of Stress : Only a little  Social Connections: Unknown   Frequency of Communication with Friends and Family: Once a week   Frequency of Social Gatherings with Friends and Family: Once a week   Attends Religious Services: Patient refused   Marine scientist or Organizations: No   Attends Music therapist: 1 to 4 times per year   Marital Status: Married    Outpatient Medications Prior to Visit  Medication Sig Dispense Refill   acetaminophen (TYLENOL) 650 MG CR tablet Take 1,300 mg by mouth every 8  (eight) hours as needed for pain.     albuterol (PROAIR HFA) 108 (90 Base) MCG/ACT inhaler Inhale 2 puffs into the lungs every 6 (six) hours as needed for wheezing. 1 each 2   apixaban (ELIQUIS) 5 MG TABS tablet Take 1 tablet (5 mg total) by mouth 2 (two) times daily. 180 tablet 2   Cholecalciferol 50 MCG (2000 UT) CAPS Take 1 capsule by mouth daily. PATIENT USING OIL AND NOT CAPSULES     diltiazem (CARDIZEM CD) 300 MG 24 hr capsule TAKE 1 CAPSULE BY MOUTH EVERY DAY 90 capsule 3   flecainide (TAMBOCOR) 50 MG tablet Take 1 tablet (50 mg total) by mouth 2 (two) times daily. 60 tablet 3   fluticasone-salmeterol (ADVAIR DISKUS) 250-50 MCG/ACT AEPB Inhale 1 puff into the lungs in the morning and at bedtime. 60 each 11   hydrochlorothiazide (HYDRODIURIL) 25 MG tablet TAKE 1 TABLET (25 MG TOTAL) BY MOUTH DAILY. 90 tablet 0   ipratropium-albuterol (DUONEB) 0.5-2.5 (3) MG/3ML SOLN Take 3 mLs by nebulization every 6 (six) hours as needed. 360 mL 0   ketorolac (ACULAR) 0.5 % ophthalmic solution Place 1 drop into the right eye 2 (two) times daily. 5 mL 0   KLOR-CON M10 10 MEQ tablet TAKE 2 TABLETS BY MOUTH DAILY 180 tablet 0   levothyroxine (SYNTHROID) 100 MCG tablet TAKE 1 TABLET BY MOUTH EVERY MORNING ON AN EMPTY STOMACH Orally Once a day 90 days 90 tablet 4   losartan (COZAAR) 100 MG tablet TAKE 1 TABLET BY MOUTH EVERY DAY 90 tablet 1   Lutein 10 MG TABS Take 10 mg by mouth daily.     MULTIPLE VITAMIN PO Take 1 tablet by mouth daily.     PSYLLIUM PO Take 1 packet by mouth daily.     fluticasone-salmeterol (ADVAIR DISKUS) 100-50 MCG/ACT AEPB Inhale 1 puff into the lungs 2 (two) times daily. 60 each 3   fluticasone-salmeterol (ADVAIR DISKUS) 100-50 MCG/ACT AEPB Inhale 1 puff into the lungs in the morning and at bedtime 180 each 0   fluticasone-salmeterol (ADVAIR DISKUS) 100-50 MCG/ACT AEPB Inhale 1 puff into the lungs 2 (two) times daily. 60 each 3   hydrochlorothiazide (MICROZIDE) 12.5 MG capsule Take 1  capsule (12.5 mg total) by mouth 2 (two) times daily for blood pressure control. 180 capsule 0   hydrochlorothiazide (MICROZIDE)  12.5 MG capsule Take 1 capsule (12.5 mg total) by mouth daily for blood pressure control. 90 capsule 0   levothyroxine (SYNTHROID) 100 MCG tablet Take 100 mcg by mouth every morning.     levothyroxine (SYNTHROID) 100 MCG tablet Take 1 tablet (100 mcg total) by mouth every morning on an empty stomach. 90 tablet 2   predniSONE (DELTASONE) 10 MG tablet Take 40mg  daily for 3days,Take 30mg  daily for 3days,Take 20mg  daily for 3days,Take 10mg  daily for 3days, then stop 30 tablet 0   No facility-administered medications prior to visit.     EXAM:  BP 134/76 (BP Location: Left Arm, Patient Position: Sitting, Cuff Size: Normal)    Pulse 79    Temp 98.5 F (36.9 C) (Oral)    Ht 5\' 8"  (1.727 m)    Wt 287 lb (130.2 kg)    SpO2 96%    BMI 43.64 kg/m   Body mass index is 43.64 kg/m. Wt Readings from Last 3 Encounters:  05/07/21 287 lb (130.2 kg)  03/08/21 287 lb 12.8 oz (130.5 kg)  02/25/21 300 lb (136.1 kg)    GENERAL: vitals reviewed and listed above, alert, oriented, appears well hydrated and in no acute distress HEENT: atraumatic, conjunctiva  clear, no obvious abnormalities on inspection of external nose and ears TMs are clear OP : no lesion edema or exudate no obvious masses palate appears to be uniform. NECK: no obvious masses on inspection no obvious adenopathy LUNGS: clear to auscultation bilaterally, no wheezes, rales or rhonchi,good air movement CV: HRRR, no clubbing cyanosis  MS: moves all extremities without noticeable focal  abnormality PSYCH: pleasant and cooperative, no obvious depression or anxiety Lab Results  Component Value Date   WBC 9.9 02/26/2021   HGB 11.9 (L) 02/26/2021   HCT 38.1 02/26/2021   PLT 257 02/26/2021   GLUCOSE 150 (H) 02/28/2021   CHOL 174 07/02/2020   TRIG 95.0 07/02/2020   HDL 39.30 07/02/2020   LDLDIRECT 112 (H) 01/10/2021    LDLCALC 116 (H) 07/02/2020   ALT 18 02/26/2021   AST 15 02/26/2021   NA 137 02/28/2021   K 4.9 02/28/2021   CL 97 (L) 02/28/2021   CREATININE 0.69 02/28/2021   BUN 22 02/28/2021   CO2 34 (H) 02/28/2021   TSH 2.00 10/12/2020   INR 0.98 08/02/2013   HGBA1C 5.7 (A) 10/24/2020   BP Readings from Last 3 Encounters:  05/07/21 134/76  03/08/21 140/60  02/28/21 (!) 125/59    ASSESSMENT AND PLAN:  Discussed the following assessment and plan:  Throat symptom  - "something caught in throat sx " - Plan: Ambulatory referral to ENT  Multinodular goiter (nontoxic)  Personal history of inhaled steroid therapy  OSA on CPAP  Atrial fibrillation, chronic ([place])  Uncomplicated asthma, unspecified asthma severity, unspecified whether persistent  OSA (obstructive sleep apnea) Sensation of something caught in throat for couple of months.  Waxes and wanes without reported dysphagia or difficulty swallowing her medicines. Thyroid under control ultrasound does not show massive goiter. Advise ENT referral for good ENT exam to evaluate throat symptoms. This no problems in the interim. -Patient advised to return or notify health care team  if  new concerns arise.  Patient Instructions  Let get ENT to check out your throat. Do not see anything of concern today on exam.   Not your thyroid .  You should be notified about appt.     [name]. Kori Colin M.D.

## 2021-05-07 NOTE — Patient Instructions (Signed)
Let get ENT to check out your throat. Do not see anything of concern today on exam.   Not your thyroid .  You should be notified about appt.

## 2021-05-07 NOTE — Chronic Care Management (AMB) (Signed)
Chronic Care Management Pharmacy Assistant   Name: Amy Stokes  MRN: [id number] DOB: [birth year]  Reason for Encounter: Disease State / Hypertension Assessment Call   Conditions to be addressed/monitored: HTN   Recent office visits:  03/08/2021 Carolann Littler MD - Patient was seen for Community acquired pneumonia of right middle lobe of lung. No medication changes. No follow up noted.   Recent consult visits:  02/18/2021 [name] MD (cardiology) - Patient was seen for Paroxysmal atrial fibrillation and additional issues. Discontinued Acular and Loratadine. Follow up in 6 months.  02/07/2021 Will Meredith Leeds MD (cardiology) - Patient was seen for Paroxysmal atrial fibrillation and an additional issue. Referral to Medical Weight Management. Started flecainide 50 mg twice daily. Follow up in 3 months.  Hospital visits:  Admitted to [facility] on 02/25/2021 due to Right Middle Lobe Pneumonia. Discharge date was 02/28/2021.  New?Medications Started at [facility] Discharge:?? doxycycline (VIBRA-TABS) ipratropium-albuterol (DUONEB) predniSONE (DELTASONE) Medication Changes at Hospital Discharge: No medication changes Medications Discontinued at Hospital Discharge: No medications discontinued Medications that remain the same after Hospital Discharge:??  -All other medications will remain the same.    Medications: Outpatient Encounter Medications as of 05/07/2021  Medication Sig   acetaminophen (TYLENOL) 650 MG CR tablet Take 1,300 mg by mouth every 8 (eight) hours as needed for pain.   albuterol (PROAIR HFA) 108 (90 Base) MCG/ACT inhaler Inhale 2 puffs into the lungs every 6 (six) hours as needed for wheezing.   apixaban (ELIQUIS) 5 MG TABS tablet Take 1 tablet (5 mg total) by mouth 2 (two) times daily.   Cholecalciferol 50 MCG (2000 UT) CAPS Take 1 capsule by mouth daily. PATIENT USING OIL AND NOT CAPSULES   diltiazem (CARDIZEM CD) 300 MG 24  hr capsule TAKE 1 CAPSULE BY MOUTH EVERY DAY   flecainide (TAMBOCOR) 50 MG tablet Take 1 tablet (50 mg total) by mouth 2 (two) times daily.   fluticasone-salmeterol (ADVAIR DISKUS) 250-50 MCG/ACT AEPB Inhale 1 puff into the lungs in the morning and at bedtime.   hydrochlorothiazide (HYDRODIURIL) 25 MG tablet TAKE 1 TABLET (25 MG TOTAL) BY MOUTH DAILY.   ipratropium-albuterol (DUONEB) 0.5-2.5 (3) MG/3ML SOLN Take 3 mLs by nebulization every 6 (six) hours as needed.   ketorolac (ACULAR) 0.5 % ophthalmic solution Place 1 drop into the right eye 2 (two) times daily.   KLOR-CON M10 10 MEQ tablet TAKE 2 TABLETS BY MOUTH DAILY   levothyroxine (SYNTHROID) 100 MCG tablet TAKE 1 TABLET BY MOUTH EVERY MORNING ON AN EMPTY STOMACH Orally Once a day 90 days   losartan (COZAAR) 100 MG tablet TAKE 1 TABLET BY MOUTH EVERY DAY   Lutein 10 MG TABS Take 10 mg by mouth daily.   MULTIPLE VITAMIN PO Take 1 tablet by mouth daily.   PSYLLIUM PO Take 1 packet by mouth daily.   No facility-administered encounter medications on file as of 05/07/2021.  Fill History: ELIQUIS 5 MG TABLET 04/04/2021 90   ADVAIR 250-50 DISKUS 04/28/2021 30   IPRAT-ALBUT 0.5-3(2.5) MG/3 ML 02/28/2021 30   LEVOTHYROXINE 100 MCG TABLET 03/05/2021 90   losartan (COZAAR) 100 MG tablet 04/17/2021 90   KLOR-CON M10 10MEQ ER TAB 05/06/2021 90   DILTIAZEM 24H ER(CD) 300 MG CP 04/04/2021 90   HYDROCHLOROTHIAZIDE 25 MG TAB 04/05/2021 90   VALACYCLOVIR 500MG  TAB 04/08/2021 15   Reviewed chart prior to disease state call. Spoke with patient regarding BP  Recent Office Vitals: BP Readings from Last 3  Encounters:  05/07/21 134/76  03/08/21 140/60  02/28/21 (!) 125/59   Pulse Readings from Last 3 Encounters:  05/07/21 79  03/08/21 97  02/28/21 77    Wt Readings from Last 3 Encounters:  05/07/21 287 lb (130.2 kg)  03/08/21 287 lb 12.8 oz (130.5 kg)  02/25/21 300 lb (136.1 kg)     Kidney Function Lab Results  Component Value  Date/Time   CREATININE 0.69 02/28/2021 04:08 AM   CREATININE 0.64 02/27/2021 04:36 AM   GFR 90.27 07/02/2020 08:05 AM   GFRNONAA >60 02/28/2021 04:08 AM   GFRAA 95 05/10/2008 09:44 AM    BMP Latest Ref Rng & Units 02/28/2021 02/27/2021 02/26/2021  Glucose 70 - 99 mg/dL 150(H) 136(H) 182(H)  BUN 8 - 23 mg/dL 22 24(H) 17  Creatinine 0.44 - 1.00 mg/dL 0.69 0.64 0.67  BUN/Creat Ratio 12 - 28 - - -  Sodium 135 - 145 mmol/L 137 135 137  Potassium 3.5 - 5.1 mmol/L 4.9 3.7 3.3(L)  Chloride 98 - 111 mmol/L 97(L) 97(L) 96(L)  CO2 22 - 32 mmol/L 34(H) 33(H) 34(H)  Calcium 8.9 - 10.3 mg/dL 8.7(L) 8.6(L) 8.8(L)   Current antihypertensive regimen:  Diltiazem 300 mg daily HCTZ 25 mg daily Losartan 100 mg daily  How often are you checking your Blood Pressure? Patient states she is checking blood pressures daily  Current home BP readings: Patient states her blood pressures run between 115/58 - 130/62  What recent interventions/DTPs have been made by any provider to improve Blood Pressure control since last CPP Visit: No recent interventions  Any recent hospitalizations or ED visits since last visit with CPP? No recent hospital visits  What diet changes have been made to improve Blood Pressure Control?  Patient doesn't eat salt and follows low sodium Breakfast - patient will have oatmeal and fruit or chicken sausage with eggs.  Lunch - patient will have leftovers Dinner - patient will have various dishes that contain a meat of fish, chicken and ground [name] with a vegetable.  What exercise is being done to improve your Blood Pressure Control?  Patient is going to the gym 3 times per week, she is doing weights and cardio for an hour each day.  Adherence Review: Is the patient currently on ACE/ARB medication? Yes Does the patient have >5 day gap between last estimated fill dates? No  Care Gaps: AWV - scheduled for 09/10/21 Last BP - 134/76 on 05/07/2021 Last A1C - 5.7 on 10/24/2020  Star  Rating Drugs: Losartan 100 mg - last filled 04/17/2021 90 DS at [facility] Pharmacist Assistant [phone]

## 2021-05-15 ENCOUNTER — Encounter: Payer: Self-pay | Admitting: Internal Medicine

## 2021-05-21 NOTE — Telephone Encounter (Signed)
Please  check into status of referral; appt .

## 2021-05-22 ENCOUNTER — Telehealth: Payer: Self-pay | Admitting: Internal Medicine

## 2021-05-22 NOTE — Telephone Encounter (Signed)
Pt is calling and as of march 1 dr Benjamine Mola ENT will no longer be seeing pt with throat problems and she needs a referral to see another ENT

## 2021-05-23 ENCOUNTER — Other Ambulatory Visit: Payer: Self-pay

## 2021-05-23 DIAGNOSIS — R6889 Other general symptoms and signs: Secondary | ICD-10-CM

## 2021-05-23 NOTE — Telephone Encounter (Signed)
New referral was sent

## 2021-05-31 ENCOUNTER — Other Ambulatory Visit: Payer: Self-pay | Admitting: Cardiology

## 2021-06-03 ENCOUNTER — Ambulatory Visit: Payer: Medicare HMO | Admitting: Cardiology

## 2021-06-03 ENCOUNTER — Encounter: Payer: Self-pay | Admitting: Cardiology

## 2021-06-03 ENCOUNTER — Other Ambulatory Visit: Payer: Self-pay

## 2021-06-03 VITALS — BP 144/60 | HR 78 | Ht 68.0 in | Wt 289.0 lb

## 2021-06-03 DIAGNOSIS — R600 Localized edema: Secondary | ICD-10-CM

## 2021-06-03 DIAGNOSIS — D6869 Other thrombophilia: Secondary | ICD-10-CM

## 2021-06-03 DIAGNOSIS — I480 Paroxysmal atrial fibrillation: Secondary | ICD-10-CM

## 2021-06-03 MED ORDER — FUROSEMIDE 20 MG PO TABS: ORAL_TABLET | ORAL | 0 refills | Status: DC

## 2021-06-03 NOTE — Progress Notes (Signed)
Electrophysiology Office Note   Date:  06/03/2021   ID:  Stokes Amy, DOB 06-Aug-1950, MRN [id number]  PCP:  Burnis Medin, MD  Cardiologist:  Harrell Gave Primary Electrophysiologist:  Kellene Mccleary Meredith Leeds, MD    Chief Complaint: AF   History of Present Illness: Amy Stokes is a 71 y.o. female who is being seen today for the evaluation of AF at the request of Panosh, [name], MD. Presenting today for electrophysiology evaluation.  She has a history significant for atrial fibrillation, hypertension, asthma, obstructive sleep apnea, morbid obesity.  When she is in atrial fibrillation she feels shortness of breath.  She was started on flecainide at her last visit.  Today, denies symptoms of palpitations, chest pain, shortness of breath, orthopnea, PND, lower extremity edema, claudication, dizziness, presyncope, syncope, bleeding, or neurologic sequela. The patient is tolerating medications without difficulties.  She feels that her atrial fibrillation burden has improved.  She is overall happy with her control.  She has no chest pain or shortness of breath and only minimal palpitations.  She that her oxygen saturations have been low when she has been coughing more often.  She is meeting with pulmonary medicine tomorrow.   Past Medical History:  Diagnosis Date   Arthritis    no meds   Asthma    Edema    Fibroids    Hyperlipidemia    Hypertension    Obesity    RML pneumonia 02/25/2021   Sleep apnea    wears cpap    Thyroid nodule 01/2009   left discovered on chest ct; needle bx "indeterminant" cannon r/o follicular neoplasm    Past Surgical History:  Procedure Laterality Date   ABDOMINAL HYSTERECTOMY Bilateral 08/02/2013   Procedure: TOTAL ABDOMINAL HYSTERECTOMY WITH BILATERAL SALPINGO-OOPHORECTOMY ;  Surgeon: Terrance Mass, MD;  Location: [facility];  Service: Gynecology;  Laterality: Bilateral;    Dr. Phineas Real to assist.  Dr. Zella Richer to follow with an umbilical  hernia repair. He Campbell Kray need an additional 1 1/2 hours.    COLONOSCOPY  12/27/2007   [facility]    HERNIA REPAIR     MOUTH SURGERY     thyroid needle bx     TONSILLECTOMY     UMBILICAL HERNIA REPAIR N/A 08/02/2013   Procedure: HERNIA REPAIR UMBILICAL ADULT WITH MESH;  Surgeon: Odis Hollingshead, MD;  Location: [facility];  Service: General;  Laterality: N/A;     Current Outpatient Medications  Medication Sig Dispense Refill   acetaminophen (TYLENOL) 650 MG CR tablet Take 1,300 mg by mouth every 8 (eight) hours as needed for pain.     albuterol (PROAIR HFA) 108 (90 Base) MCG/ACT inhaler Inhale 2 puffs into the lungs every 6 (six) hours as needed for wheezing. 1 each 2   apixaban (ELIQUIS) 5 MG TABS tablet Take 1 tablet (5 mg total) by mouth 2 (two) times daily. 180 tablet 2   Cholecalciferol 50 MCG (2000 UT) CAPS Take 1 capsule by mouth daily. PATIENT USING OIL AND NOT CAPSULES     diltiazem (CARDIZEM CD) 300 MG 24 hr capsule TAKE 1 CAPSULE BY MOUTH EVERY DAY 90 capsule 3   flecainide (TAMBOCOR) 50 MG tablet TAKE 1 TABLET BY MOUTH TWICE A DAY 60 tablet 8   fluticasone-salmeterol (ADVAIR DISKUS) 250-50 MCG/ACT AEPB Inhale 1 puff into the lungs in the morning and at bedtime. 60 each 11   hydrochlorothiazide (HYDRODIURIL) 25 MG tablet TAKE 1 TABLET (25 MG TOTAL) BY MOUTH DAILY. 90 tablet  0   ipratropium-albuterol (DUONEB) 0.5-2.5 (3) MG/3ML SOLN Take 3 mLs by nebulization every 6 (six) hours as needed. 360 mL 0   KLOR-CON M10 10 MEQ tablet TAKE 2 TABLETS BY MOUTH DAILY 180 tablet 0   levothyroxine (SYNTHROID) 100 MCG tablet TAKE 1 TABLET BY MOUTH EVERY MORNING ON AN EMPTY STOMACH Orally Once a day 90 days 90 tablet 4   losartan (COZAAR) 100 MG tablet TAKE 1 TABLET BY MOUTH EVERY DAY 90 tablet 1   Lutein 10 MG TABS Take 10 mg by mouth daily.     MULTIPLE VITAMIN PO Take 1 tablet by mouth daily.     PSYLLIUM PO Take 1 packet by mouth daily.     No current facility-administered medications for this  visit.    Allergies:   Patient has no known allergies.   Social History:  The patient  reports that she quit smoking about 50 years ago. Her smoking use included cigarettes. She has a 1.00 pack-year smoking history. She has never been exposed to tobacco smoke. She has never used smokeless tobacco. She reports current alcohol use. She reports that she does not use drugs.   Family History:  The patient's family history includes Arthritis in her mother; Heart disease in her brother and father; Other in her mother; Pancreatic cancer in her mother; Thyroid nodules in her sister.   ROS:  Please see the history of present illness.   Otherwise, review of systems is positive for none.   All other systems are reviewed and negative.   PHYSICAL EXAM: VS:  BP (!) 144/60    Pulse 78    Ht '5\' 8"'$  (1.727 m)    Wt 289 lb (131.1 kg)    SpO2 94%    BMI 43.94 kg/m  , BMI Body mass index is 43.94 kg/m. GEN: Well nourished, well developed, in no acute distress  HEENT: normal  Neck: no JVD, carotid bruits, or masses Cardiac: RRR; no murmurs, rubs, or gallops, 1-2+ edema  Respiratory:  clear to auscultation bilaterally, normal work of breathing GI: soft, nontender, nondistended, + BS MS: no deformity or atrophy  Skin: warm and dry Neuro:  Strength and sensation are intact Psych: euthymic mood, full affect  EKG:  EKG is ordered today. Personal review of the ekg ordered shows sinus rhythm, rate 78  Recent Labs: 10/12/2020: TSH 2.00 02/26/2021: ALT 18; Hemoglobin 11.9; Platelets 257 02/28/2021: BUN 22; Creatinine, Ser 0.69; Magnesium 2.3; Potassium 4.9; Sodium 137    Lipid Panel     Component Value Date/Time   CHOL 174 07/02/2020 0805   TRIG 95.0 07/02/2020 0805   HDL 39.30 07/02/2020 0805   CHOLHDL 4 07/02/2020 0805   VLDL 19.0 07/02/2020 0805   LDLCALC 116 (H) 07/02/2020 0805   LDLDIRECT 112 (H) 01/10/2021 1016   LDLDIRECT 141.7 04/03/2011 1043     Wt Readings from Last 3 Encounters:  06/03/21  289 lb (131.1 kg)  05/07/21 287 lb (130.2 kg)  03/08/21 287 lb 12.8 oz (130.5 kg)      Other studies Reviewed: Additional studies/ records that were reviewed today include: TTE 11/01/20  Review of the above records today demonstrates:   1. Left ventricular ejection fraction, by estimation, is 55 to 60%. The  left ventricle has normal function. The left ventricle has no regional  wall motion abnormalities. Left ventricular diastolic parameters are  indeterminate.   2. Right ventricular systolic function is normal. The right ventricular  size is mildly enlarged. Tricuspid regurgitation  signal is inadequate for  assessing PA pressure.   3. The mitral valve is grossly normal. No evidence of mitral valve  regurgitation. No evidence of mitral stenosis.   4. The aortic valve is grossly normal. Aortic valve regurgitation is not  visualized. No aortic stenosis is present.   5. The inferior vena cava is dilated in size with >50% respiratory  variability, suggesting right atrial pressure of 8 mmHg.    ASSESSMENT AND PLAN:  1.  Paroxysmal atrial fibrillation: Currently on diltiazem 300 mg daily, flecainide 50 mg twice daily, Eliquis 5 mg twice daily.  High risk medication monitoring for flecainide.  CHA2DS2-VASc of 3.  Remains in sinus rhythm.  We Rohail Klees continue with current management.  2.  Hypertension: Mildly elevated today.  Blood pressure results from home showed normal blood pressures.  3.  Hyperlipidemia: Continue statin per primary cardiology  4.  Obstructive sleep apnea: CPAP compliance encouraged  5.  Morbid obesity:Body mass index is 43.94 kg/m. Has been referred to weight loss clinic  6.  Lower extremity edema: Has 1-2+ edema.  She has been having some cough and possibly shortness of breath.  We Tamari Redwine start Lasix 20 mg daily for the next 4 days.  She Bryson Gavia then restart her hydrochlorothiazide.  7.  Hypercoagulable state secondary: On Eliquis for atrial fibrillation as  above. Current medicines are reviewed at length with the patient today.   The patient does not have concerns regarding her medicines.  The following changes were made today: Start flecainide 50 mg  Labs/ tests ordered today include:  Orders Placed This Encounter  Procedures   EKG 12-Lead     Disposition:   FU with Samantha Olivera 3 months  Signed, Bosco Paparella Meredith Leeds, MD  06/03/2021 3:42 PM     [address] [place] [address] [phone] (office) [phone] (fax)

## 2021-06-03 NOTE — Patient Instructions (Signed)
Medication Instructions:  ?Your physician has recommended you make the following change in your medication:  ?HOLD Hydrochlorothiazide for 4 days while you take Lasix 20 mg once daily & Potassium 20 mEq once daily.  At the end of the 4 days you will return to Hydrochlorothiazide daily ? ?*If you need a refill on your cardiac medications before your next appointment, please call your pharmacy* ? ? ?Lab Work: ?None ordered ? ? ?Testing/Procedures: ?None ordered ? ? ?Follow-Up: ?At [facility], you and your health needs are our priority.  As part of our continuing mission to provide you with exceptional heart care, we have created designated Provider Care Teams.  These Care Teams include your primary Cardiologist (physician) and Advanced Practice Providers (APPs -  Physician Assistants and Nurse Practitioners) who all work together to provide you with the care you need, when you need it. ? ?Your next appointment:   ?6 month(s) ? ?The format for your next appointment:   ?In Person ? ?Provider:   ?You will see one of the following Advanced Practice Providers on your designated Care Team:   ?Tommye Standard, PA-C ?Legrand Como "Jonni Sanger" [place], PA-C ? ? ? ? ?Thank you for choosing [facility]!! ? ? ?Trinidad Curet, RN ?([phone] ?

## 2021-06-04 ENCOUNTER — Encounter: Payer: Self-pay | Admitting: Nurse Practitioner

## 2021-06-04 ENCOUNTER — Ambulatory Visit: Payer: Medicare HMO | Admitting: Nurse Practitioner

## 2021-06-04 ENCOUNTER — Ambulatory Visit (INDEPENDENT_AMBULATORY_CARE_PROVIDER_SITE_OTHER): Payer: Medicare HMO

## 2021-06-04 VITALS — BP 126/64 | HR 75 | Temp 98.0°F | Ht 68.0 in | Wt 287.0 lb

## 2021-06-04 DIAGNOSIS — J4540 Moderate persistent asthma, uncomplicated: Secondary | ICD-10-CM | POA: Diagnosis not present

## 2021-06-04 DIAGNOSIS — Z9989 Dependence on other enabling machines and devices: Secondary | ICD-10-CM | POA: Diagnosis not present

## 2021-06-04 DIAGNOSIS — R600 Localized edema: Secondary | ICD-10-CM

## 2021-06-04 DIAGNOSIS — R059 Cough, unspecified: Secondary | ICD-10-CM | POA: Diagnosis not present

## 2021-06-04 DIAGNOSIS — J45901 Unspecified asthma with (acute) exacerbation: Secondary | ICD-10-CM | POA: Diagnosis not present

## 2021-06-04 DIAGNOSIS — R768 Other specified abnormal immunological findings in serum: Secondary | ICD-10-CM | POA: Diagnosis not present

## 2021-06-04 DIAGNOSIS — I10 Essential (primary) hypertension: Secondary | ICD-10-CM | POA: Diagnosis not present

## 2021-06-04 DIAGNOSIS — R918 Other nonspecific abnormal finding of lung field: Secondary | ICD-10-CM | POA: Diagnosis not present

## 2021-06-04 DIAGNOSIS — G4733 Obstructive sleep apnea (adult) (pediatric): Secondary | ICD-10-CM | POA: Diagnosis not present

## 2021-06-04 DIAGNOSIS — R7689 Other specified abnormal immunological findings in serum: Secondary | ICD-10-CM

## 2021-06-04 DIAGNOSIS — R058 Other specified cough: Secondary | ICD-10-CM | POA: Diagnosis not present

## 2021-06-04 DIAGNOSIS — R062 Wheezing: Secondary | ICD-10-CM | POA: Diagnosis not present

## 2021-06-04 LAB — CBC WITH DIFFERENTIAL/PLATELET
Basophils Absolute: 0 10*3/uL (ref 0.0–0.1)
Basophils Relative: 0.5 % (ref 0.0–3.0)
Eosinophils Absolute: 0.3 10*3/uL (ref 0.0–0.7)
Eosinophils Relative: 4.1 % (ref 0.0–5.0)
HCT: 38.2 % (ref 36.0–46.0)
Hemoglobin: 12.6 g/dL (ref 12.0–15.0)
Lymphocytes Relative: 17 % (ref 12.0–46.0)
Lymphs Abs: 1.1 10*3/uL (ref 0.7–4.0)
MCHC: 32.8 g/dL (ref 30.0–36.0)
MCV: 86 fl (ref 78.0–100.0)
Monocytes Absolute: 0.4 10*3/uL (ref 0.1–1.0)
Monocytes Relative: 6.8 % (ref 3.0–12.0)
Neutro Abs: 4.7 10*3/uL (ref 1.4–7.7)
Neutrophils Relative %: 71.6 % (ref 43.0–77.0)
Platelets: 251 10*3/uL (ref 150.0–400.0)
RBC: 4.45 Mil/uL (ref 3.87–5.11)
RDW: 14.9 % (ref 11.5–15.5)
WBC: 6.6 10*3/uL (ref 4.0–10.5)

## 2021-06-04 LAB — NITRIC OXIDE: Nitric Oxide: 17

## 2021-06-04 MED ORDER — FLUTICASONE PROPIONATE 50 MCG/ACT NA SUSP: 1.0000 | Freq: Every day | NASAL | 2 refills | Status: DC

## 2021-06-04 MED ORDER — SPACER/AERO-HOLDING CHAMBERS DEVI: 2 refills | Status: AC

## 2021-06-04 MED ORDER — ADVAIR HFA 230-21 MCG/ACT IN AERO: 2.0000 | INHALATION_SPRAY | Freq: Two times a day (BID) | RESPIRATORY_TRACT | 12 refills | Status: DC

## 2021-06-04 MED ORDER — PANTOPRAZOLE SODIUM 40 MG PO TBEC: 40.0000 mg | DELAYED_RELEASE_TABLET | Freq: Every day | ORAL | 1 refills | Status: DC

## 2021-06-04 NOTE — Assessment & Plan Note (Signed)
Appears resolved on exam. Following with cardiologist who increased lasix. Advised her to notify if increased dosing improves cough/wheezing.  ?

## 2021-06-04 NOTE — Progress Notes (Signed)
Please notify patient no evidence of pneumonia or pulmonary edema. She does have some areas of scarring in her lung bases, which are unchanged from previous exam. Continue with our plan as discussed yesterday. Thanks!

## 2021-06-04 NOTE — Progress Notes (Signed)
$'@Patient'a$  ID: Amy Stokes, female    DOB: 1951/03/09, 71 y.o.   MRN: [id number]  Chief Complaint  Patient presents with   Follow-up    She reports she has cough and wheezing, non productive cough. X 1 month.     Referring provider: Burnis Medin, MD  HPI: 71 year old female, former remote smoker followed for asthma and OSA.  She is a patient of Dr. Judson Roch and last seen in office on 08/22/2020.  Past medical history significant for obesity, hypertension, chronic A-fib on Eliquis, hypothyroidism, thyroid goiter, HLD.  TEST/EVENTS:  02/13/2014: AHI 16.7, SpO2 low 70% 11/01/2020 echocardiogram: EF 95-18%, LV diastolic parameters were indeterminate. Previous echo with GIIDD. RV function normal but mildly enlarged. Unable to measure PASP.   08/22/2020: OV with Dr. Ander Slade.  Doing well on CPAP therapy.  Has occasional wheezing.  Using Advair 100 twice a day and rare use of albuterol.  Advised to increase to Advair 250 given worsening it wheezy.  Patient reported she wanted to complete the refill she had on hand prior to increasing dose.  Rx sent for Advair 250 and instructed to start when she finishes.  06/04/2021: Today-acute visit Patient presents today for reported cough and wheezing.  She has been having the symptoms for approximately a month now.  Her cough is primarily nonproductive; although, she occasionally feels like she coughs some stuff up but hasn't seen what color it is because she says it's not much. She hasn't noticed any increased shortness of breath during the day but does report that sometimes at night, she feels like she's breathing a little harder and she has noticed that when she gets up to go to the bathroom, her oxygen has been in the low to mid 80's. She is wearing her CPAP machine and feels like she continues to get good benefit from it. She did see her cardiologist because she had some increased swelling in her legs and he increased her lasix for the next 4-5 days which  she starts today. No URI symptoms or fevers. Does occasionally sneeze after being outside. She did step up to Advair 250 DPI after her last visit but isn't sure if she noticed much difference in her wheezing. Rarely uses albuterol.   FeNO 17 ppb  No Known Allergies  Immunization History  Administered Date(s) Administered   Fluad Quad(high Dose 65+) 12/07/2019, 01/08/2021   Influenza Split 11/04/2012, 01/04/2014   Influenza, High Dose Seasonal PF 11/06/2016, 01/26/2018, 02/16/2019   Influenza,inj,Quad PF,6+ Mos 04/19/2015, 12/27/2015   Influenza-Unspecified 11/06/2016, 12/07/2019   PFIZER Comirnaty(Gray Top)Covid-19 Tri-Sucrose Vaccine 07/10/2020   PFIZER(Purple Top)SARS-COV-2 Vaccination 11/18/2018, 12/15/2018, 11/03/2019   Pfizer Covid-19 Vaccine Bivalent Booster 64yr & up 01/08/2021   Pneumococcal Conjugate-13 04/03/2016   Pneumococcal Polysaccharide-23 01/31/2010, 04/11/2020   Tdap 07/28/2013   Zoster Recombinat (Shingrix) 06/13/2017, 09/27/2017    Past Medical History:  Diagnosis Date   Arthritis    no meds   Asthma    Edema    Fibroids    Hyperlipidemia    Hypertension    Obesity    RML pneumonia 02/25/2021   Sleep apnea    wears cpap    Thyroid nodule 01/2009   left discovered on chest ct; needle bx "indeterminant" cannon r/o follicular neoplasm     Tobacco History: Social History   Tobacco Use  Smoking Status Former   Packs/day: 1.00   Years: 1.00   Pack years: 1.00   Types: Cigarettes   Quit date: 04/29/1971  Years since quitting: 50.1   Passive exposure: Never  Smokeless Tobacco Never   Counseling given: Not Answered   Outpatient Medications Prior to Visit  Medication Sig Dispense Refill   acetaminophen (TYLENOL) 650 MG CR tablet Take 1,300 mg by mouth every 8 (eight) hours as needed for pain.     albuterol (PROAIR HFA) 108 (90 Base) MCG/ACT inhaler Inhale 2 puffs into the lungs every 6 (six) hours as needed for wheezing. 1 each 2   apixaban  (ELIQUIS) 5 MG TABS tablet Take 1 tablet (5 mg total) by mouth 2 (two) times daily. 180 tablet 2   Cholecalciferol 50 MCG (2000 UT) CAPS Take 1 capsule by mouth daily. PATIENT USING OIL AND NOT CAPSULES     diltiazem (CARDIZEM CD) 300 MG 24 hr capsule TAKE 1 CAPSULE BY MOUTH EVERY DAY 90 capsule 3   flecainide (TAMBOCOR) 50 MG tablet TAKE 1 TABLET BY MOUTH TWICE A DAY 60 tablet 8   furosemide (LASIX) 20 MG tablet Take one tablet once daily for 4 days, then resume your Hydrochlorothiazide 4 tablet 0   hydrochlorothiazide (HYDRODIURIL) 25 MG tablet TAKE 1 TABLET (25 MG TOTAL) BY MOUTH DAILY. 90 tablet 0   ipratropium-albuterol (DUONEB) 0.5-2.5 (3) MG/3ML SOLN Take 3 mLs by nebulization every 6 (six) hours as needed. 360 mL 0   KLOR-CON M10 10 MEQ tablet TAKE 2 TABLETS BY MOUTH DAILY 180 tablet 0   levothyroxine (SYNTHROID) 100 MCG tablet TAKE 1 TABLET BY MOUTH EVERY MORNING ON AN EMPTY STOMACH Orally Once a day 90 days 90 tablet 4   losartan (COZAAR) 100 MG tablet TAKE 1 TABLET BY MOUTH EVERY DAY 90 tablet 1   Lutein 10 MG TABS Take 10 mg by mouth daily.     MULTIPLE VITAMIN PO Take 1 tablet by mouth daily.     PSYLLIUM PO Take 1 packet by mouth daily.     fluticasone-salmeterol (ADVAIR DISKUS) 250-50 MCG/ACT AEPB Inhale 1 puff into the lungs in the morning and at bedtime. 60 each 11   No facility-administered medications prior to visit.     Review of Systems:   Constitutional: No weight loss or gain, night sweats, fevers, chills, fatigue, or lassitude. HEENT: No headaches, difficulty swallowing, tooth/dental problems, or sore throat. No itching, ear ache, nasal congestion, or post nasal drip. +occasional sneezing after being outside CV:  +swelling in lower extremities, PND. No chest pain, orthopnea, anasarca, dizziness, palpitations, syncope Resp: +dry cough; wheezing. No shortness of breath with exertion or at rest. No excess mucus or change in color of mucus.  No hemoptysis. No chest wall  deformity GI:  No heartburn, indigestion, abdominal pain, nausea, vomiting, diarrhea, change in bowel habits, loss of appetite, bloody stools.  GU: No dysuria, change in color of urine, urgency or frequency.  No flank pain, no hematuria  Skin: No rash, lesions, ulcerations MSK:  No joint pain or swelling.  No decreased range of motion.  No back pain. Neuro: No dizziness or lightheadedness.  Psych: No depression or anxiety. Mood stable.     Physical Exam:  BP 126/64 (BP Location: Left Arm, Patient Position: Sitting, Cuff Size: Large)    Pulse 75    Temp 98 F (36.7 C) (Oral)    Ht '5\' 8"'$  (1.727 m)    Wt 287 lb (130.2 kg)    SpO2 95%    BMI 43.64 kg/m   GEN: Pleasant, interactive, well-appearing; obese; in no acute distress. HEENT:  Normocephalic and atraumatic. EACs  patent bilaterally. TM pearly gray with present light reflex bilaterally. PERRLA. Sclera white. Nasal turbinates pink, moist and patent bilaterally. No rhinorrhea present. Oropharynx erythematous and moist, without exudate or edema. No lesions, ulcerations, or postnasal drip.  NECK:  Supple w/ fair ROM. No JVD present. Normal carotid impulses w/o bruits. Thyroid symmetrical with no goiter or nodules palpated. No lymphadenopathy.   CV: RRR, no m/r/g, no peripheral edema. Pulses intact, +2 bilaterally. No cyanosis, pallor or clubbing. PULMONARY:  Unlabored, regular breathing. Clear bilaterally A&P w/o wheezes/rales/rhonchi. No accessory muscle use. No dullness to percussion. GI: BS present and normoactive. Soft, non-tender to palpation. No organomegaly or masses detected. No CVA tenderness. MSK: No erythema, warmth or tenderness. Cap refil <2 sec all extrem. No deformities or joint swelling noted.  Neuro: A/Ox3. No focal deficits noted.   Skin: Warm, no lesions or rashe Psych: Normal affect and behavior. Judgement and thought content appropriate.     Lab Results:  CBC    Component Value Date/Time   WBC 9.9 02/26/2021 0421    RBC 4.24 02/26/2021 0421   HGB 11.9 (L) 02/26/2021 0421   HGB 12.8 01/10/2021 1016   HCT 38.1 02/26/2021 0421   HCT 39.7 01/10/2021 1016   PLT 257 02/26/2021 0421   PLT 266 01/10/2021 1016   MCV 89.9 02/26/2021 0421   MCV 84 01/10/2021 1016   MCH 28.1 02/26/2021 0421   MCHC 31.2 02/26/2021 0421   RDW 14.9 02/26/2021 0421   RDW 13.3 01/10/2021 1016   LYMPHSABS 0.4 (L) 02/26/2021 0421   MONOABS 0.1 02/26/2021 0421   EOSABS 0.0 02/26/2021 0421   BASOSABS 0.0 02/26/2021 0421    BMET    Component Value Date/Time   NA 137 02/28/2021 0408   NA 140 01/10/2021 1016   K 4.9 02/28/2021 0408   CL 97 (L) 02/28/2021 0408   CO2 34 (H) 02/28/2021 0408   GLUCOSE 150 (H) 02/28/2021 0408   BUN 22 02/28/2021 0408   BUN 12 01/10/2021 1016   CREATININE 0.69 02/28/2021 0408   CALCIUM 8.7 (L) 02/28/2021 0408   GFRNONAA >60 02/28/2021 0408   GFRAA 95 05/10/2008 0944    BNP No results found for: BNP   Imaging:  No results found.    No flowsheet data found.  Lab Results  Component Value Date   NITRICOXIDE 17 06/04/2021        Assessment & Plan:   Asthma, moderate persistent No evidence of bronchospasm on exam and FeNO nl. Suspect cough and wheezing primarily from upper airway irritation based on exam and symptoms. Will hold off on prednisone at this time. Change from DPI to HFA with spacer. CBC with diff and IgE to assess for eosinophilia/allergen component.   Patient Instructions  Switch Advair diskus to Advair 230 HFA 2 puffs Twice daily. Use with spacer. Brush tongue and rinse mouth afterwards.  -Continue Albuterol inhaler 2 puffs or duoneb 3 mL neb every 6 hours as needed for shortness of breath or wheezing. Notify if symptoms persist despite rescue inhaler/neb use. -Continue increased lasix dosing as prescribed by cardiology   Continue to use CPAP every night, minimum of 4-6 hours a night.   Labs - CBC with diff, IgE  Mucinex 600 mg Twice daily for  cough/congestion Delsym 2 tsp Twice daily for cough Chlortab 4 mg At bedtime for cough  Flonase nasal spray 1 spray each nostril daily  Over the counter allergy medicine daily during allergy season (can take the one you have  at home) Protonix 40 mg daily   Follow up in one month with Dr. Ander Slade or Joellen Jersey Cintia Gleed,NP. If symptoms do not improve or worsen, please contact office for sooner follow up or seek emergency care.    Upper airway cough syndrome Trigger prevention. Optimize GERD and postnasal drainage control. Advised frequent sips of water and SF hard candies; avoid throat clearing. Cough suppression measures. CXR today to r/o superimposed infection or pulmonary edema.   Bilateral lower extremity edema Appears resolved on exam. Following with cardiologist who increased lasix. Advised her to notify if increased dosing improves cough/wheezing.   Essential hypertension Well-controlled on current antihypertensive regimen. Follow up with cardiology as scheduled.   OSA (obstructive sleep apnea) Unable to get download today. Reports nightly compliance with device and good benefit. Has noticed some increased PND and oxygen desaturations at night when she gets up to use the bathroom. CPAP titration study for further evaluation.   I spent 35 minutes of dedicated to the care of this patient on the date of this encounter to include pre-visit review of records, face-to-face time with the patient discussing conditions above, post visit ordering of testing, clinical documentation with the electronic health record, making appropriate referrals as documented, and communicating necessary findings to members of the patients care team.  Clayton Bibles, NP 06/04/2021  Pt aware and understands NP's role.

## 2021-06-04 NOTE — Assessment & Plan Note (Signed)
Well-controlled on current antihypertensive regimen. Follow up with cardiology as scheduled.  ?

## 2021-06-04 NOTE — Patient Instructions (Addendum)
Switch Advair diskus to Advair 230 HFA 2 puffs Twice daily. Use with spacer. Brush tongue and rinse mouth afterwards.  ?-Continue Albuterol inhaler 2 puffs or duoneb 3 mL neb every 6 hours as needed for shortness of breath or wheezing. Notify if symptoms persist despite rescue inhaler/neb use. ?-Continue increased lasix dosing as prescribed by cardiology  ? ?Continue to use CPAP every night, minimum of 4-6 hours a night.  ? ?Labs - CBC with diff, IgE ? ?Mucinex 600 mg Twice daily for cough/congestion ?Delsym 2 tsp Twice daily for cough ?Chlortab 4 mg At bedtime for cough  ?Flonase nasal spray 1 spray each nostril daily  ?Over the counter allergy medicine daily during allergy season (can take the one you have at home) ?Protonix 40 mg daily  ? ?Follow up in one month with Dr. Ander Slade or Alanson Aly. If symptoms do not improve or worsen, please contact office for sooner follow up or seek emergency care. ?

## 2021-06-04 NOTE — Assessment & Plan Note (Addendum)
No evidence of bronchospasm on exam and FeNO nl. Suspect cough and wheezing primarily from upper airway irritation based on exam and symptoms. Will hold off on prednisone at this time. Change from DPI to HFA with spacer. CBC with diff and IgE to assess for eosinophilia/allergen component.  ? ?Patient Instructions  ?Switch Advair diskus to Advair 230 HFA 2 puffs Twice daily. Use with spacer. Brush tongue and rinse mouth afterwards.  ?-Continue Albuterol inhaler 2 puffs or duoneb 3 mL neb every 6 hours as needed for shortness of breath or wheezing. Notify if symptoms persist despite rescue inhaler/neb use. ?-Continue increased lasix dosing as prescribed by cardiology  ? ?Continue to use CPAP every night, minimum of 4-6 hours a night.  ? ?Labs - CBC with diff, IgE ? ?Mucinex 600 mg Twice daily for cough/congestion ?Delsym 2 tsp Twice daily for cough ?Chlortab 4 mg At bedtime for cough  ?Flonase nasal spray 1 spray each nostril daily  ?Over the counter allergy medicine daily during allergy season (can take the one you have at home) ?Protonix 40 mg daily  ? ?Follow up in one month with Dr. Ander Slade or Alanson Aly. If symptoms do not improve or worsen, please contact office for sooner follow up or seek emergency care. ? ? ?

## 2021-06-04 NOTE — Assessment & Plan Note (Addendum)
Trigger prevention. Optimize GERD and postnasal drainage control. Advised frequent sips of water and SF hard candies; avoid throat clearing. Cough suppression measures. CXR today to r/o superimposed infection or pulmonary edema.  ?

## 2021-06-04 NOTE — Assessment & Plan Note (Addendum)
Unable to get download today. Reports nightly compliance with device and good benefit. Has noticed some increased PND and oxygen desaturations at night when she gets up to use the bathroom. CPAP titration study for further evaluation. ?

## 2021-06-05 LAB — IGE: IgE (Immunoglobulin E), Serum: 1409 kU/L — ABNORMAL HIGH (ref <=114)

## 2021-06-05 MED ORDER — PREDNISONE 20 MG PO TABS: 40.0000 mg | ORAL_TABLET | Freq: Every day | ORAL | 0 refills | Status: AC

## 2021-06-05 MED ORDER — MONTELUKAST SODIUM 10 MG PO TABS: 10.0000 mg | ORAL_TABLET | Freq: Every day | ORAL | 5 refills | Status: DC

## 2021-06-05 NOTE — Addendum Note (Signed)
Addended by: Clayton Bibles on: 06/05/2021 03:41 PM ? ? Modules accepted: Orders ? ?

## 2021-06-05 NOTE — Progress Notes (Signed)
Notified pt of lab results. IgE significantly elevated to 1409, Eos mildly elevated 0.3. Advised she start Singulair 10 mg At bedtime. Prednisone 40 mg for 5 days. Referral sent to allergy/asthma for further workup/eval. Pt verbalized understanding.  ?

## 2021-06-05 NOTE — Progress Notes (Signed)
Spoke with pt to discuss results. See encounter visit

## 2021-06-06 DIAGNOSIS — H35033 Hypertensive retinopathy, bilateral: Secondary | ICD-10-CM | POA: Diagnosis not present

## 2021-06-06 DIAGNOSIS — H2513 Age-related nuclear cataract, bilateral: Secondary | ICD-10-CM | POA: Diagnosis not present

## 2021-06-06 DIAGNOSIS — H353131 Nonexudative age-related macular degeneration, bilateral, early dry stage: Secondary | ICD-10-CM | POA: Diagnosis not present

## 2021-06-06 DIAGNOSIS — H524 Presbyopia: Secondary | ICD-10-CM | POA: Diagnosis not present

## 2021-06-06 DIAGNOSIS — H40013 Open angle with borderline findings, low risk, bilateral: Secondary | ICD-10-CM | POA: Diagnosis not present

## 2021-06-07 NOTE — Telephone Encounter (Signed)
Received the following message from patient:  ? ?"My o2  saturation levels have improved.  They are 88 -93. My cough has lessened and when I do cough it is productive. I don't have any wheezing. ?  ?Thanks for listening!!" ? ?Will forward to [place] as a FYI.  ?

## 2021-06-09 DIAGNOSIS — R69 Illness, unspecified: Secondary | ICD-10-CM | POA: Diagnosis not present

## 2021-06-10 ENCOUNTER — Other Ambulatory Visit (HOSPITAL_COMMUNITY): Payer: Self-pay

## 2021-06-11 ENCOUNTER — Ambulatory Visit: Payer: Medicare HMO | Admitting: Acute Care

## 2021-06-11 ENCOUNTER — Encounter: Payer: Self-pay | Admitting: Internal Medicine

## 2021-06-12 ENCOUNTER — Telehealth: Payer: Self-pay | Admitting: Nurse Practitioner

## 2021-06-12 NOTE — Telephone Encounter (Signed)
Peer to peer performed with Isac Sarna, MD. Stated they need updated compliance record from CPAP machine in order to approve CPAP titration. [facility] stated to obtain this and file an appeal; should approve CPAP titration study after this. Unable to do remote download. Contacted pt to request she bring in her SD card - stated she would bring it today.  ?

## 2021-06-13 NOTE — Telephone Encounter (Signed)
Auth request denied.  Initiated a "fast appeal" w/ [employer] Rec ID# [id number] w/ [place].  Faxing records to [phone].  Should have 72 hour turn around with a fast appeal.   ?

## 2021-06-13 NOTE — Telephone Encounter (Signed)
Okay I got her download. We can submit an appeal with it as she is compliant. 29/30 days used (only date that didn't show was last night because she brought her SD card to the office yesterday afternoon so really she would be 30/30 with 100% compliance). Average usage was 7 hours 54 min. No significant leaks (median 2.7). AHI 2.0 ? ?Per [name] NP ?

## 2021-06-14 NOTE — Telephone Encounter (Signed)
Appeal was approved pt is scheduled. Nothing further needed.  ?

## 2021-06-18 ENCOUNTER — Telehealth: Payer: Self-pay | Admitting: Nurse Practitioner

## 2021-06-19 NOTE — Telephone Encounter (Signed)
They probably sent the letter out before we did the appeal. We were able to get her sleep study approved through the appeal and I have it scheduled. I sent her a packet out in the mail. I will call her and let her know as well what is going on.  ?

## 2021-06-26 ENCOUNTER — Other Ambulatory Visit (HOSPITAL_COMMUNITY): Payer: Self-pay

## 2021-07-05 ENCOUNTER — Ambulatory Visit: Payer: Medicare HMO | Admitting: Pulmonary Disease

## 2021-07-05 ENCOUNTER — Encounter: Payer: Self-pay | Admitting: Nurse Practitioner

## 2021-07-05 ENCOUNTER — Ambulatory Visit: Payer: Medicare HMO | Admitting: Nurse Practitioner

## 2021-07-05 ENCOUNTER — Ambulatory Visit (INDEPENDENT_AMBULATORY_CARE_PROVIDER_SITE_OTHER): Payer: Medicare HMO

## 2021-07-05 VITALS — BP 122/62 | HR 74 | Ht 68.0 in | Wt 288.4 lb

## 2021-07-05 DIAGNOSIS — J4540 Moderate persistent asthma, uncomplicated: Secondary | ICD-10-CM

## 2021-07-05 DIAGNOSIS — J302 Other seasonal allergic rhinitis: Secondary | ICD-10-CM | POA: Diagnosis not present

## 2021-07-05 DIAGNOSIS — G4733 Obstructive sleep apnea (adult) (pediatric): Secondary | ICD-10-CM

## 2021-07-05 DIAGNOSIS — R058 Other specified cough: Secondary | ICD-10-CM | POA: Diagnosis not present

## 2021-07-05 DIAGNOSIS — J9601 Acute respiratory failure with hypoxia: Secondary | ICD-10-CM | POA: Diagnosis not present

## 2021-07-05 DIAGNOSIS — I482 Chronic atrial fibrillation, unspecified: Secondary | ICD-10-CM | POA: Diagnosis not present

## 2021-07-05 DIAGNOSIS — R600 Localized edema: Secondary | ICD-10-CM | POA: Diagnosis not present

## 2021-07-05 DIAGNOSIS — R0902 Hypoxemia: Secondary | ICD-10-CM | POA: Diagnosis not present

## 2021-07-05 DIAGNOSIS — R768 Other specified abnormal immunological findings in serum: Secondary | ICD-10-CM | POA: Diagnosis not present

## 2021-07-05 DIAGNOSIS — R062 Wheezing: Secondary | ICD-10-CM | POA: Diagnosis not present

## 2021-07-05 LAB — NITRIC OXIDE: FeNO level (ppb): 22

## 2021-07-05 MED ORDER — FEXOFENADINE HCL 180 MG PO TABS: 180.0000 mg | ORAL_TABLET | Freq: Every day | ORAL | 5 refills | Status: DC

## 2021-07-05 NOTE — Progress Notes (Addendum)
$'@Patient'S$  ID: Amy Stokes, female    DOB: 01/21/1951, 71 y.o.   MRN: [id number]  Chief Complaint  Patient presents with   Follow-up    Wheezing, sob desat in room o2 was 84% self recovered to 94% within 17mn Cpap compliance     Referring provider: PBurnis Medin MD  HPI: 71 year old female, former remote smoker followed for asthma and OSA. She is a patient of Dr. OJudson Rochand last seen in office 06/04/2021 by Jakhiya Brower,NP. Past medical history significant for obesity, HTN, chronic A fib on Eliquis, hypothyroidism with thyroid goiter, HLD.   TEST/EVENTS:  02/13/2014 HST: AHI 71.7, SpO2 low 70% 11/01/2020 echocardiogram: EF 578-29% LV diastolic parameters were indeterminate. Previous echo with GIIDD. RV function nl but mildly enlarged. Unable to measure PASP.   08/22/2020: OV with Dr. OAnder Slade Doing well on CPAP therapy. Has occasional wheezing. Increased Advair to 250 from 100.   06/04/2021: OV with Rett Stehlik NP. Worsening cough and wheezing over the past month. FeNO 17 ppb. CBC with elevated eosinophils at 0.3 and IgE elevated to 1409. Started on singulair and short prednisone burst. Referred to allergy for further workup. Initiated therapies for postnasal drainage and cough control with mucinex, delsym, chlortab, and flonase. Reported some difficulties with increased SOB at night and drops in her oxygen when she would get up to use the bathroom despite consistent use of her CPAP. CPAP titration was ordered for further evaluation for oxygen requirement. Also reported some recent increase in BLE edema recently which cardiology had started her on lasix for 4-5 days. Appeared compensated on exam. CXR without acute process; chronic linear opacities b/l unchanged and consistent with scarring.   71/09/2021: Today - follow up Patient presents today for follow up. She reports that she was feeling better and her cough had improved up until this past week. She felt like her allergies slightly worsened and that  her cough increased in frequency. Remained non-productive. She also was noted to be 84% on room air upon arrival to exam room; quickly recovered to 94% with rest. She denied any recent shortness of breath but has noticed some occasional wheezing. She has still noticed that her oxygen levels have dropped into the 80's at night on her fit bit monitor, despite CPAP therapy. Plans for CPAP titration on 5/25. She reported that the swelling in her legs has improved as well, but she does notice that this is occasionally increased in the evenings. She has plans to follow up with cardiology. She continues on Advair Twice daily, flonase, singulair, and over the counter allergy medicine.   FeNO 22 ppb  No Known Allergies  Immunization History  Administered Date(s) Administered   Fluad Quad(high Dose 65+) 12/07/2019, 01/08/2021   Influenza Split 11/04/2012, 01/04/2014   Influenza, High Dose Seasonal PF 11/06/2016, 01/26/2018, 02/16/2019   Influenza,inj,Quad PF,6+ Mos 04/19/2015, 12/27/2015   Influenza-Unspecified 11/06/2016, 12/07/2019   PFIZER Comirnaty(Gray Top)Covid-19 Tri-Sucrose Vaccine 07/10/2020   PFIZER(Purple Top)SARS-COV-2 Vaccination 11/18/2018, 12/15/2018, 11/03/2019   Pfizer Covid-19 Vaccine Bivalent Booster 18yr& up 01/08/2021   Pneumococcal Conjugate-13 04/03/2016   Pneumococcal Polysaccharide-23 01/31/2010, 04/11/2020   Tdap 07/28/2013   Zoster Recombinat (Shingrix) 06/13/2017, 09/27/2017    Past Medical History:  Diagnosis Date   Arthritis    no meds   Asthma    Edema    Fibroids    Hyperlipidemia    Hypertension    Obesity    RML pneumonia 02/25/2021   Sleep apnea    wears cpap  Thyroid nodule 01/2009   left discovered on chest ct; needle bx "indeterminant" cannon r/o follicular neoplasm     Tobacco History: Social History   Tobacco Use  Smoking Status Former   Packs/day: 1.00   Years: 1.00   Total pack years: 1.00   Types: Cigarettes   Quit date: 04/29/1971    Years since quitting: 50.4   Passive exposure: Never  Smokeless Tobacco Never   Counseling given: Not Answered   Outpatient Medications Prior to Visit  Medication Sig Dispense Refill   acetaminophen (TYLENOL) 650 MG CR tablet Take 1,300 mg by mouth every 8 (eight) hours as needed for pain.     apixaban (ELIQUIS) 5 MG TABS tablet Take 1 tablet (5 mg total) by mouth 2 (two) times daily. 180 tablet 2   Cholecalciferol 50 MCG (2000 UT) CAPS Take 1 capsule by mouth daily. PATIENT USING OIL AND NOT CAPSULES     diltiazem (CARDIZEM CD) 300 MG 24 hr capsule TAKE 1 CAPSULE BY MOUTH EVERY DAY 90 capsule 3   flecainide (TAMBOCOR) 50 MG tablet TAKE 1 TABLET BY MOUTH TWICE A DAY 60 tablet 8   fluticasone (FLONASE) 50 MCG/ACT nasal spray Place 1 spray into both nostrils daily. 18.2 mL 2   levothyroxine (SYNTHROID) 100 MCG tablet TAKE 1 TABLET BY MOUTH EVERY MORNING ON AN EMPTY STOMACH Orally Once a day 90 days 90 tablet 4   losartan (COZAAR) 100 MG tablet TAKE 1 TABLET BY MOUTH EVERY DAY 90 tablet 1   Lutein 10 MG TABS Take 10 mg by mouth daily.     montelukast (SINGULAIR) 10 MG tablet Take 1 tablet (10 mg total) by mouth at bedtime. 30 tablet 5   MULTIPLE VITAMIN PO Take 1 tablet by mouth daily.     pantoprazole (PROTONIX) 40 MG tablet Take 1 tablet (40 mg total) by mouth daily. 90 tablet 1   PSYLLIUM PO Take 1 packet by mouth daily.     Spacer/Aero-Holding Dorise Bullion Use with spacer 1 each 2   hydrochlorothiazide (HYDRODIURIL) 25 MG tablet TAKE 1 TABLET (25 MG TOTAL) BY MOUTH DAILY. 90 tablet 0   KLOR-CON M10 10 MEQ tablet TAKE 2 TABLETS BY MOUTH DAILY 180 tablet 0   albuterol (PROAIR HFA) 108 (90 Base) MCG/ACT inhaler Inhale 2 puffs into the lungs every 6 (six) hours as needed for wheezing. 1 each 2   ipratropium-albuterol (DUONEB) 0.5-2.5 (3) MG/3ML SOLN Take 3 mLs by nebulization every 6 (six) hours as needed. 360 mL 0   fluticasone-salmeterol (ADVAIR HFA) 230-21 MCG/ACT inhaler Inhale 2  puffs into the lungs 2 (two) times daily. 1 each 12   furosemide (LASIX) 20 MG tablet Take one tablet once daily for 4 days, then resume your Hydrochlorothiazide (Patient not taking: Reported on 07/05/2021) 4 tablet 0   No facility-administered medications prior to visit.     Review of Systems:   Constitutional: No weight loss or gain, night sweats, fevers, chills, fatigue, or lassitude. HEENT: No headaches, difficulty swallowing, tooth/dental problems, or sore throat. No itching, ear ache. +occasional sneezing, nasal congestion after being outside  CV:  +swelling in lower extremities in evenings (improved); occasional PND. No chest pain, orthopnea, anasarca, dizziness, palpitations, syncope Resp: +non-productive cough; occasional wheeze. No shortness of breath with exertion or at rest. No excess mucus or change in color of mucus. No hemoptysis. No chest wall deformity GI:  No heartburn, indigestion, abdominal pain, nausea, vomiting, diarrhea, change in bowel habits, loss of appetite, bloody stools.  Skin: No rash, lesions, ulcerations Neuro: No dizziness or lightheadedness.  Psych: No depression or anxiety. Mood stable.     Physical Exam:  BP 122/62 (BP Location: Left Arm, Cuff Size: Large)   Pulse 74   Ht '5\' 8"'$  (1.727 m)   Wt 288 lb 6.4 oz (130.8 kg)   SpO2 94%   BMI 43.85 kg/m   GEN: Pleasant, interactive, well-appearing; obese; in no acute distress. HEENT:  Normocephalic and atraumatic. EACs patent bilaterally. TM pearly gray with present light reflex bilaterally. PERRLA. Sclera white. Nasal turbinates boggy, moist and patent bilaterally. No rhinorrhea present. Oropharynx erythematous and moist, without exudate or edema. No lesions, ulcerations  NECK:  Supple w/ fair ROM. No JVD present. Normal carotid impulses w/o bruits. Thyroid symmetrical with no goiter or nodules palpated. No lymphadenopathy.   CV: RRR, no m/r/g, no peripheral edema. Pulses intact, +2 bilaterally. No cyanosis,  pallor or clubbing. PULMONARY:  Unlabored, regular breathing. Clear bilaterally A&P w/o wheezes/rales/rhonchi. No accessory muscle use. No dullness to percussion. GI: BS present and normoactive. Soft, non-tender to palpation. No organomegaly or masses detected. No CVA tenderness. MSK: No erythema, warmth or tenderness. Cap refil <2 sec all extrem. No deformities or joint swelling noted.  Neuro: A/Ox3. No focal deficits noted.   Skin: Warm, no lesions or rashe Psych: Normal affect and behavior. Judgement and thought content appropriate.     Lab Results:  CBC    Component Value Date/Time   WBC 6.6 06/04/2021 1055   RBC 4.45 06/04/2021 1055   HGB 12.6 06/04/2021 1055   HGB 12.8 01/10/2021 1016   HCT 38.2 06/04/2021 1055   HCT 39.7 01/10/2021 1016   PLT 251.0 06/04/2021 1055   PLT 266 01/10/2021 1016   MCV 86.0 06/04/2021 1055   MCV 84 01/10/2021 1016   MCH 28.1 02/26/2021 0421   MCHC 32.8 06/04/2021 1055   RDW 14.9 06/04/2021 1055   RDW 13.3 01/10/2021 1016   LYMPHSABS 1.1 06/04/2021 1055   MONOABS 0.4 06/04/2021 1055   EOSABS 0.3 06/04/2021 1055   BASOSABS 0.0 06/04/2021 1055    BMET    Component Value Date/Time   NA 141 09/10/2021 0936   K 3.9 09/10/2021 0936   CL 98 09/10/2021 0936   CO2 30 (H) 09/10/2021 0936   GLUCOSE 93 09/10/2021 0936   GLUCOSE 150 (H) 02/28/2021 0408   BUN 12 09/10/2021 0936   CREATININE 0.68 09/10/2021 0936   CALCIUM 9.1 09/10/2021 0936   GFRNONAA >60 02/28/2021 0408   GFRAA 95 05/10/2008 0944    BNP No results found for: "BNP"   Imaging:  No results found.        No data to display          Lab Results  Component Value Date   NITRICOXIDE 17 06/04/2021        Assessment & Plan:   Not well controlled moderate persistent asthma Breathing stable overall. Concern for hypoxia with initial O2 saturation 84% on arrival to room - walking oximetry with CMA with finger probe with desaturation to 87%, required 2 lpm to  recover to 90's. CXR was obtained during visit which was negative for acute process and FeNO was nl. Concern for inaccurate readings given stable clinical presentation and no acute change on CXR. I personally walked patient with forehead probe without any desaturations; SpO2 low was 93% with appropriate pleth. Completed 700 ft without difficulty. Suspect initial false readings on finger probe.   Continue ICS/LABA therapy with  Advair. Singulair at bedtime. Suspect cough more so allergic/upper airway in nature given normal FeNO. Trigger prevention. Target cough and postnasal drainage control.  Patient Instructions  Continue Advair 230 HFA 2 puffs Twice daily once you finish your current diskus. Use with spacer. Brush tongue and rinse mouth afterwards.  -Continue Albuterol inhaler 2 puffs or duoneb 3 mL neb every 6 hours as needed for shortness of breath or wheezing. Notify if symptoms persist despite rescue inhaler/neb use. -Start Mucinex DM 600 mg Twice daily for cough/congestion -Continue Chlortab 4 mg At bedtime for cough  -Increase Flonase nasal spray to 2 sprays each nostril daily  -Continue protonix 40 mg daily    Continue to use CPAP every night, minimum of 4-6 hours a night.  CPAP titration study scheduled for 4/25  -Allegra 180 mg tab in AM for allergies.   Follow up with cardiology and your a fib specialist   Follow up after sleep study with Dr. Ander Slade or Joellen Jersey Jasmynn Pfalzgraf,NP. If symptoms do not improve or worsen, please contact office for sooner follow up or seek emergency care.    OSA (obstructive sleep apnea) Excellent compliance with good benefit. Continues to notice decreased O2 levels into 80's upon waking at night. Awaiting CPAP titration on 4/25.   Upper airway cough syndrome Flare in cough with flare in allergy symptoms. Reported wheeze likely pseudowheeze. No evidence of bronchospasm on exam. Restart cough control measures and increase flonase for postnasal drainage control.  Step up to [facility] for allergies until seen by allergist.   Elevated IgE level Awaiting allergy appt upcoming in May. Continue trigger prevention with Singulair and Allegra.   Atrial fibrillation, chronic ([place]) Rate controlled at visit. Currently on diltiazem and anticoagulated with Eliquis. Follow up with Dr. Curt Bears as scheduled  Bilateral lower extremity edema Improved; although, still having occasional mild edema in evenings. Appeared compensated on exam today without evidence of pulmonary edema on CXR. Advised to follow up with Dr. Harrell Gave to discuss edema and diuretic therapies.    I spent 42 minutes of dedicated to the care of this patient on the date of this encounter to include pre-visit review of records, face-to-face time with the patient discussing conditions above, post visit ordering of testing, clinical documentation with the electronic health record, making appropriate referrals as documented, and communicating necessary findings to members of the patients care team.  Clayton Bibles, NP 09/12/2021  Pt aware and understands NP's role.

## 2021-07-05 NOTE — Patient Instructions (Addendum)
Continue Advair 230 HFA 2 puffs Twice daily once you finish your current diskus. Use with spacer. Brush tongue and rinse mouth afterwards.  -Continue Albuterol inhaler 2 puffs or duoneb 3 mL neb every 6 hours as needed for shortness of breath or wheezing. Notify if symptoms persist despite rescue inhaler/neb use. -Start Mucinex DM 600 mg Twice daily for cough/congestion -Continue Chlortab 4 mg At bedtime for cough  -Increase Flonase nasal spray to 2 sprays each nostril daily  -Continue protonix 40 mg daily    Continue to use CPAP every night, minimum of 4-6 hours a night.  CPAP titration study scheduled for 4/25  -Allegra 180 mg tab in AM for allergies.   Follow up with cardiology and your a fib specialist   Follow up after sleep study with Dr. Ander Slade or Joellen Jersey Brady Plant,NP. If symptoms do not improve or worsen, please contact office for sooner follow up or seek emergency care.

## 2021-07-05 NOTE — Assessment & Plan Note (Addendum)
Breathing stable overall. Concern for hypoxia with initial O2 saturation 84% on arrival to room - walking oximetry with CMA with finger probe with desaturation to 87%, required 2 lpm to recover to 90's. CXR was obtained during visit which was negative for acute process and FeNO was nl. Concern for inaccurate readings given stable clinical presentation and no acute change on CXR. I personally walked patient with forehead probe without any desaturations; SpO2 low was 93% with appropriate pleth. Completed 700 ft without difficulty. Suspect initial false readings on finger probe.   Continue ICS/LABA therapy with Advair. Singulair at bedtime. Suspect cough more so allergic/upper airway in nature given normal FeNO. Trigger prevention. Target cough and postnasal drainage control.  Patient Instructions  Continue Advair 230 HFA 2 puffs Twice daily once you finish your current diskus. Use with spacer. Brush tongue and rinse mouth afterwards.  -Continue Albuterol inhaler 2 puffs or duoneb 3 mL neb every 6 hours as needed for shortness of breath or wheezing. Notify if symptoms persist despite rescue inhaler/neb use. -Start Mucinex DM 600 mg Twice daily for cough/congestion -Continue Chlortab 4 mg At bedtime for cough  -Increase Flonase nasal spray to 2 sprays each nostril daily  -Continue protonix 40 mg daily    Continue to use CPAP every night, minimum of 4-6 hours a night.  CPAP titration study scheduled for 4/25  -Allegra 180 mg tab in AM for allergies.   Follow up with cardiology and your a fib specialist   Follow up after sleep study with Dr. Ander Slade or Joellen Jersey Dyami Umbach,NP. If symptoms do not improve or worsen, please contact office for sooner follow up or seek emergency care.

## 2021-07-05 NOTE — Assessment & Plan Note (Signed)
Flare in cough with flare in allergy symptoms. Reported wheeze likely pseudowheeze. No evidence of bronchospasm on exam. Restart cough control measures and increase flonase for postnasal drainage control. Step up to [place] for allergies until seen by allergist.  ?

## 2021-07-05 NOTE — Assessment & Plan Note (Signed)
Excellent compliance with good benefit. Continues to notice decreased O2 levels into 80's upon waking at night. Awaiting CPAP titration on 4/25.  ?

## 2021-07-05 NOTE — Assessment & Plan Note (Signed)
Awaiting allergy appt upcoming in May. Continue trigger prevention with Singulair and Allegra.  ?

## 2021-07-05 NOTE — Assessment & Plan Note (Signed)
Improved; although, still having occasional mild edema in evenings. Appeared compensated on exam today without evidence of pulmonary edema on CXR. Advised to follow up with Dr. Harrell Gave to discuss edema and diuretic therapies.  ?

## 2021-07-05 NOTE — Assessment & Plan Note (Signed)
Rate controlled at visit. Currently on diltiazem and anticoagulated with Eliquis. Follow up with Dr. Curt Bears as scheduled ?

## 2021-07-11 ENCOUNTER — Other Ambulatory Visit: Payer: Self-pay | Admitting: Otolaryngology

## 2021-07-11 DIAGNOSIS — D164 Benign neoplasm of bones of skull and face: Secondary | ICD-10-CM | POA: Diagnosis not present

## 2021-07-11 DIAGNOSIS — R0989 Other specified symptoms and signs involving the circulatory and respiratory systems: Secondary | ICD-10-CM

## 2021-07-15 ENCOUNTER — Ambulatory Visit
Admission: RE | Admit: 2021-07-15 | Discharge: 2021-07-15 | Disposition: A | Discharge status: Home / Self Care | Payer: Medicare HMO | Source: Ambulatory Visit | Attending: Otolaryngology | Admitting: Otolaryngology

## 2021-07-15 DIAGNOSIS — D164 Benign neoplasm of bones of skull and face: Secondary | ICD-10-CM

## 2021-07-15 DIAGNOSIS — R09A2 Foreign body sensation, throat: Secondary | ICD-10-CM

## 2021-07-15 DIAGNOSIS — K2289 Other specified disease of esophagus: Secondary | ICD-10-CM | POA: Diagnosis not present

## 2021-07-15 DIAGNOSIS — R0989 Other specified symptoms and signs involving the circulatory and respiratory systems: Secondary | ICD-10-CM

## 2021-07-20 ENCOUNTER — Other Ambulatory Visit: Payer: Self-pay | Admitting: Internal Medicine

## 2021-07-23 ENCOUNTER — Ambulatory Visit (HOSPITAL_BASED_OUTPATIENT_CLINIC_OR_DEPARTMENT_OTHER): Payer: Medicare HMO | Attending: Nurse Practitioner | Admitting: Cardiology

## 2021-07-23 DIAGNOSIS — G4736 Sleep related hypoventilation in conditions classified elsewhere: Secondary | ICD-10-CM | POA: Insufficient documentation

## 2021-07-23 DIAGNOSIS — Z9989 Dependence on other enabling machines and devices: Secondary | ICD-10-CM | POA: Insufficient documentation

## 2021-07-23 DIAGNOSIS — G4733 Obstructive sleep apnea (adult) (pediatric): Secondary | ICD-10-CM | POA: Diagnosis not present

## 2021-07-24 NOTE — Procedures (Signed)
? ?  Patient Name: Amy Stokes, Amy Stokes ?Study Date: 07/23/2021 ?Gender: Female ?D.O.B: 10-02-1950 ?Age (years): 59 ?Referring Provider: Clayton Bibles NP ?Height (inches): 68 ?Interpreting Physician: Fransico Him MD, ABSM ?Weight (lbs): 280 ?RPSGT: Zadie Rhine ?BMI: 43 ?MRN: [id number] ?Neck Size: 16.00 ? ?CLINICAL INFORMATION ?The patient is referred for a CPAP titration to treat sleep apnea. ? ?SLEEP STUDY TECHNIQUE ?As per the AASM Manual for the Scoring of Sleep and Associated Events v2.3 (April 2016) with a hypopnea requiring 4% desaturations. ? ?The channels recorded and monitored were frontal, central and occipital EEG, electrooculogram (EOG), submentalis EMG (chin), nasal and oral airflow, thoracic and abdominal wall motion, anterior tibialis EMG, snore microphone, electrocardiogram, and pulse oximetry. Continuous positive airway pressure (CPAP) was initiated at the beginning of the study and titrated to treat sleep-disordered breathing. ? ?MEDICATIONS ?Medications self-administered by patient taken the night of the study : FLECAINIDE ACETATE, Eliquis, LOSARTAN, ADVAIR DISKUS ? ?TECHNICIAN COMMENTS ?Comments added by technician: No restroom visted. O2 initiated due to low sats. ?Comments added by scorer: N/A ? ?RESPIRATORY PARAMETERS ?Optimal PAP Pressure (cm):12  ?AHI at Optimal Pressure (/hr):0 ?Overall Minimal O2 (%):76.0  ?Supine % at Optimal Pressure (%):100 ?Minimal O2 at Optimal Pressure (%): 89.0  ? ?SLEEP ARCHITECTURE ?The study was initiated at 9:33:50 PM and ended at 4:10:39 AM. ? ?Sleep onset time was 16.3 minutes and the sleep efficiency was 76.6%. The total sleep time was 304 minutes. ? ?The patient spent 5.9%% of the night in stage N1 sleep, 58.9% in stage N2 sleep, 11.7% in stage N3 and 23.5% in REM.Stage REM latency was 172.0 minutes ? ?Wake after sleep onset was 76.5. Alpha intrusion was absent. Supine sleep was 100.00%. ? ?CARDIAC DATA ?The 2 lead EKG demonstrated sinus rhythm. The mean  heart rate was 67.8 beats per minute. Other EKG findings include: None. ? ?LEG MOVEMENT DATA ?The total Periodic Limb Movements of Sleep (PLMS) were 0. The PLMS index was 0.0. A PLMS index of <15 is considered normal in adults. ? ?IMPRESSIONS ?- The optimal PAP pressure was 12 cm of water. ?- Severe oxygen desaturations were observed during this titration (min O2 = 76.0%). ?- No snoring was audible during this study. ?- No cardiac abnormalities were observed during this study. ?- Clinically significant periodic limb movements were not noted during this study. Arousals associated with PLMs were rare. ? ?DIAGNOSIS ?- Obstructive Sleep Apnea (G47.33) ?- Nocturnal Hypoxemia ? ?RECOMMENDATIONS ?- Trial of auto CPAP therapy from 4-20 cm H2O with a Small-Medium size Fisher[employer]Paykel Full Face Evora Full mask and heated humidification. ?- Avoid alcohol, sedatives and other CNS depressants that may worsen sleep apnea and disrupt normal sleep architecture. ?- Sleep hygiene should be reviewed to assess factors that may improve sleep quality. ?- Weight management and regular exercise should be initiated or continued. ?- Return to [facility] for re-evaluation after 6 weeks of therapy ? ?[Electronically signed] 07/24/2021 08:17 AM ? ?Fransico Him MD, ABSM ?Diplomate, Tax adviser of Sleep Medicine ?

## 2021-07-25 ENCOUNTER — Telehealth: Payer: Self-pay | Admitting: *Deleted

## 2021-07-25 NOTE — Telephone Encounter (Signed)
The patient has been notified of the result and verbalized understanding.  All questions (if any) were answered. ?Amy Stokes, [place] 07/25/2021 4:28 PM   ? ?Upon patient request DME selection is [facility] ?Patient understands he will be contacted by [facility] to set up his cpap. ?Patient understands to call if [facility] does not contact him with new setup in a timely manner. ?Patient understands they will be called once confirmation has been received from [facility] that they have received their new machine to schedule 10 week follow up appointment. ?  ?Price notified of new cpap order  ?Please add to airview ?Patient was grateful for the call and thanked me.   ? ? ?

## 2021-07-25 NOTE — Telephone Encounter (Signed)
-----   Message from Lauralee Evener, [place] sent at 07/24/2021  8:23 AM EDT ----- ? ?----- Message ----- ?From: Sueanne Margarita, MD ?Sent: 07/24/2021   8:19 AM EDT ?To: [facility] Sleep Studies ? ?Please let patient know that they had a successful PAP titration and let DME know that orders are in EPIC.  Please set up 6 week OV with me.  ?  ? ?

## 2021-07-28 NOTE — Progress Notes (Signed)
? ?New Patient Note ? ?RE: Amy Stokes MRN: [id number] DOB: 04-12-1950 ?Date of Office Visit: 07/29/2021 ? ?Consult requested by: Clayton Bibles, NP ?Primary care provider: Burnis Medin, MD ? ?Chief Complaint: Establish Care ? ?History of Present Illness: ?I had the pleasure of seeing Amy Stokes for initial evaluation at the [facility] on 07/29/2021. She is a 71 y.o. female, who is referred here by [name], NP (pulm) for the evaluation of asthma. ? ?She reports symptoms of chest tightness, shortness of breath, coughing, wheezing for 30 years. Current medications include Advair HFA 245mg 2 puffs twice a day, albuterol prn and albuterol neb which help. She reports using aerochamber with inhalers. She tried the following inhalers: Dulera. Main triggers are dust. In the last month, frequency of symptoms: daily. Frequency of nocturnal symptoms: 0x/month. Frequency of SABA use: rarely - less than once a month. Interference with physical activity: no. Sleep is undisturbed. In the last 12 months, emergency room visits/urgent care visits/doctor office visits or hospitalizations due to respiratory issues: one. In the last 12 months, oral steroids courses: several times. Lifetime history of hospitalization for respiratory issues: yes. History of pneumonia: yes in November 2022. She was evaluated by allergist/pulmonologist in the past. Smoking exposure: quit. Up to date with flu vaccine: yes. Up to date with pneumonia vaccine: yes. Up to date with COVID-19 vaccine: yes. Prior Covid-19 infection: no. ?History of reflux: takes Protonix.  ?Patient follows with pulmonology and has CPAP for OSA. ? ?Component ?    Latest Ref Rng 06/04/2021  ?WBC ?    4.0 - 10.5 K/uL 6.6   ?RBC ?    3.87 - 5.11 Mil/uL 4.45   ?Hemoglobin ?    12.0 - 15.0 g/dL 12.6   ?HCT ?    36.0 - 46.0 % 38.2   ?MCV ?    78.0 - 100.0 fl 86.0   ?MCHC ?    30.0 - 36.0 g/dL 32.8   ?RDW ?    11.5 - 15.5 % 14.9   ?Platelets ?     150.0 - 400.0 K/uL 251.0   ?Neutrophils ?    43.0 - 77.0 % 71.6   ?Lymphocytes ?    12.0 - 46.0 % 17.0   ?Monocytes Relative ?    3.0 - 12.0 % 6.8   ?Eosinophil ?    0.0 - 5.0 % 4.1   ?Basophil ?    0.0 - 3.0 % 0.5   ?NEUT# ?    1.4 - 7.7 K/uL 4.7   ?Lymphocyte # ?    0.7 - 4.0 K/uL 1.1   ?Monocyte # ?    0.1 - 1.0 K/uL 0.4   ?Eosinophils Absolute ?    0.0 - 0.7 K/uL 0.3   ?Basophils Absolute ?    0.0 - 0.1 K/uL 0.0   ? ? ?Component ?    Latest Ref Rng 06/04/2021  ?IgE (Immunoglobulin E), Serum ?    <OR=114 kU/L 1,409 (H)   ? ?Assessment and Plan: ?[name] a 71y.o. female with: ?Not well controlled moderate persistent asthma ?Diagnosed with asthma for 30 years.  Currently on Advair 230 mcg 2 puffs twice a day and uses albuterol as needed less than once a month however has daily symptoms.  Main triggers include dust.  Follows with pulmonology as well.  2023 blood work showed elevated eosinophils of 300 and IgE of 1409. ?Today's spirometry showed severe restriction with no improvement FEV1  postbronchodilator treatment.  Clinically unchanged. ?Today's skin testing was positive to mold, cockroach and dust mites. Borderline positive to soy (tolerates with no issues, monitor symptoms). ?Read about injections for asthma - handout given.  ?Daily controller medication(s): start Breztri 2 puffs with spacer and rinse mouth afterwards. Samples given. ?This replaces Advair for now.  ?Let us know if it is too expensive.  ?Continue Singulair (montelukast) '10mg'$  daily at night. ?May use albuterol rescue inhaler 2 puffs or nebulizer every 4 to 6 hours as needed for shortness of breath, chest tightness, coughing, and wheezing. May use albuterol rescue inhaler 2 puffs 5 to 15 minutes prior to strenuous physical activities. Monitor frequency of use.  ?Get spirometry at next visit. ?Consider adding on biologics if no improvement.  ? ?Other allergic rhinitis ?Some sneezing at times.  No recent allergy testing.  Takes Allegra, Singulair  and Flonase with some benefit.  No prior ENT evaluation. ?Today's skin testing showed: Positive to mold, cockroach, dust mites.  ?Start environmental control measures as below. ?Use over the counter antihistamines such as Zyrtec (cetirizine), Claritin (loratadine), Allegra (fexofenadine), or Xyzal (levocetirizine) daily as needed. May take twice a day during allergy flares. May switch antihistamines every few months. ?Continue Singulair (montelukast) '10mg'$  daily at night. ?Use Flonase (fluticasone) nasal spray 1 spray per nostril twice a day as needed for nasal congestion.  ?Nasal saline spray (i.e., Simply Saline) or nasal saline lavage (i.e., NeilMed) is recommended as needed and prior to medicated nasal sprays. ? ?Return in about 2 months (around 09/28/2021). ? ?Meds ordered this encounter  ?Medications  ? Budeson-Glycopyrrol-Formoterol (BREZTRI AEROSPHERE) 160-9-4.8 MCG/ACT AERO  ?  Sig: Inhale 2 puffs into the lungs in the morning and at bedtime. with spacer and rinse mouth afterwards.  ?  Dispense:  10.7 g  ?  Refill:  3  ? ?Lab Orders  ?No laboratory test(s) ordered today  ? ? ?Other allergy screening: ?Rhino conjunctivitis:  rare sneezing at times. ?No recent allergy testing. ?Currently on allegra and Singulair daily with some benefit. ?Taking Flonase 2 sprays per nostril BID. No nosebleeds. ?No prior ENT, no prior sinus surgery.  ? ?Food allergy: no ?Medication allergy: no ?Hymenoptera allergy: no ?Urticaria: in her 60s.  ?Eczema: as a child.  ?History of recurrent infections suggestive of immunodeficency: no ? ?Diagnostics: ?Spirometry:  ?Tracings reviewed. Her effort: It was hard to get consistent efforts and there is a question as to whether this reflects a maximal maneuver. ?FVC: 1.11L ?FEV1: 0.94L, 35% predicted ?FEV1/FVC ratio: 85% ?Interpretation: Spirometry consistent with severe restrictive disease with no improvement in FEV1 post bronchodilator treatment. Clinically feeling unchanged.  ? ?Please see  scanned spirometry results for details. ? ?Skin Testing: Environmental allergy panel and select foods. ?Positive to mold, cockroach, dust mites.  ? ?Borderline to soy.  ?Results discussed with patient/family. ? Airborne Adult Perc - 07/29/21 1000   ? ? Time Antigen Placed 2440   ? Allergen Manufacturer Lavella Hammock   ? Location Back   ? Number of Test 59   ? 1. Control-Buffer 50% Glycerol Negative   ? 2. Control-Histamine 1 mg/ml 2+   ? 3. Albumin saline Negative   ? 4. [place] Negative   ? 5. [place] Negative   ? 6. [place] Negative   ? 7. [place] Blue Negative   ? 8. Meadow Fescue Negative   ? 9. Perennial Rye Negative   ? 10. Sweet Vernal Negative   ? 11. Timothy Negative   ? 12. Cocklebur Negative   ?  13. Burweed Marshelder Negative   ? 14. Ragweed, short Negative   ? 15. Ragweed, Giant Negative   ? 16. Plantain,  English Negative   ? 17. Lamb's Quarters Negative   ? 18. Sheep Sorrell Negative   ? 19. Rough Pigweed Negative   ? 20. Marsh Elder, Rough Negative   ? 21. Mugwort, Common Negative   ? 22. Ash mix Negative   ? 23. Wendee Copp mix Negative   ? 24. Beech American Negative   ? 25. Box, Elder Negative   ? 26. Cedar, red Negative   ? 27. Cottonwood, [place] Negative   ? 28. Elm mix Negative   ? 29. Hickory Negative   ? 30. Maple mix Negative   ? 31. Oak, [place] mix Negative   ? 32. Pecan Pollen Negative   ? 33. Pine mix Negative   ? 34. Sycamore Eastern Negative   ? 35. Walnut, Black Pollen Negative   ? 36. Alternaria alternata Negative   ? 54. Cladosporium Herbarum Negative   ? 38. Aspergillus mix 3+   ? 39. Penicillium mix Negative   ? 40. Bipolaris sorokiniana (Helminthosporium) Negative   ? 41. Drechslera spicifera (Curvularia) 2+   ? 42. Mucor plumbeus Negative   ? 43. Fusarium moniliforme 2+   ? 44. Aureobasidium pullulans (pullulara) Negative   ? 45. Rhizopus oryzae Negative   ? 46. Botrytis cinera Negative   ? 47. Epicoccum nigrum Negative   ? 48. Phoma betae 2+   ? 49. Candida Albicans Negative   ? 50.  Trichophyton mentagrophytes 4+   ? 51. Mite, D Farinae  5,000 AU/ml Negative   ? 52. Mite, D Pteronyssinus  5,000 AU/ml Negative   ? 53. Cat Hair 10,000 BAU/ml Negative   ? 54.  Dog Epithelia Negative   ? 55. Mixe

## 2021-07-29 ENCOUNTER — Encounter: Payer: Self-pay | Admitting: Allergy

## 2021-07-29 ENCOUNTER — Ambulatory Visit: Payer: Medicare HMO | Admitting: Allergy

## 2021-07-29 VITALS — BP 124/74 | HR 90 | Temp 98.8°F | Ht 68.0 in | Wt 281.8 lb

## 2021-07-29 DIAGNOSIS — J4540 Moderate persistent asthma, uncomplicated: Secondary | ICD-10-CM | POA: Diagnosis not present

## 2021-07-29 DIAGNOSIS — J3089 Other allergic rhinitis: Secondary | ICD-10-CM

## 2021-07-29 DIAGNOSIS — Z872 Personal history of diseases of the skin and subcutaneous tissue: Secondary | ICD-10-CM

## 2021-07-29 MED ORDER — BREZTRI AEROSPHERE 160-9-4.8 MCG/ACT IN AERO: 2.0000 | INHALATION_SPRAY | Freq: Two times a day (BID) | RESPIRATORY_TRACT | 3 refills | Status: DC

## 2021-07-29 NOTE — Assessment & Plan Note (Addendum)
Diagnosed with asthma for 30 years.  Currently on Advair 230 mcg 2 puffs twice a day and uses albuterol as needed less than once a month however has daily symptoms.  Main triggers include dust.  Follows with pulmonology as well.  2023 blood work showed elevated eosinophils of 300 and IgE of 1409. ?? Today's spirometry showed severe restriction with no improvement FEV1 postbronchodilator treatment.  Clinically unchanged. ?? Today's skin testing was positive to mold, cockroach and dust mites. Borderline positive to soy (tolerates with no issues, monitor symptoms). ?? Read about injections for asthma - handout given.  ?? Daily controller medication(s): start Breztri 2 puffs with spacer and rinse mouth afterwards. Samples given. ?o This replaces Advair for now.  ?o Let us know if it is too expensive.  ?o Continue Singulair (montelukast) '10mg'$  daily at night. ?? May use albuterol rescue inhaler 2 puffs or nebulizer every 4 to 6 hours as needed for shortness of breath, chest tightness, coughing, and wheezing. May use albuterol rescue inhaler 2 puffs 5 to 15 minutes prior to strenuous physical activities. Monitor frequency of use.  ?? Get spirometry at next visit. ?? Consider adding on biologics if no improvement.  ?

## 2021-07-29 NOTE — Assessment & Plan Note (Signed)
Some sneezing at times.  No recent allergy testing.  Takes Allegra, Singulair and Flonase with some benefit.  No prior ENT evaluation. ?? Today's skin testing showed: Positive to mold, cockroach, dust mites.  ?? Start environmental control measures as below. ?? Use over the counter antihistamines such as Zyrtec (cetirizine), Claritin (loratadine), Allegra (fexofenadine), or Xyzal (levocetirizine) daily as needed. May take twice a day during allergy flares. May switch antihistamines every few months. ?? Continue Singulair (montelukast) '10mg'$  daily at night. ?? Use Flonase (fluticasone) nasal spray 1 spray per nostril twice a day as needed for nasal congestion.  ?? Nasal saline spray (i.e., Simply Saline) or nasal saline lavage (i.e., NeilMed) is recommended as needed and prior to medicated nasal sprays. ?

## 2021-07-29 NOTE — Patient Instructions (Addendum)
Today's skin testing showed: ?Positive to mold, cockroach, dust mites.  ? ?Borderline to soy - monitor symptoms after you eat foods with soy.  ? ?Results given. ? ?Asthma: ?Read about injections for asthma - handout given.  ?Daily controller medication(s): start Breztri 2 puffs with spacer and rinse mouth afterwards. Samples given. ?This replaces Advair for now.  ?Let us know if it is too expensive.  ?May use albuterol rescue inhaler 2 puffs or nebulizer every 4 to 6 hours as needed for shortness of breath, chest tightness, coughing, and wheezing. May use albuterol rescue inhaler 2 puffs 5 to 15 minutes prior to strenuous physical activities. Monitor frequency of use.  ?Asthma control goals:  ?Full participation in all desired activities (may need albuterol before activity) ?Albuterol use two times or less a week on average (not counting use with activity) ?Cough interfering with sleep two times or less a month ?Oral steroids no more than once a year ?No hospitalizations  ? ?Environmental allergies ?Start environmental control measures as below. ?Use over the counter antihistamines such as Zyrtec (cetirizine), Claritin (loratadine), Allegra (fexofenadine), or Xyzal (levocetirizine) daily as needed. May take twice a day during allergy flares. May switch antihistamines every few months. ?Continue Singulair (montelukast) '10mg'$  daily at night. ?Use Flonase (fluticasone) nasal spray 1 spray per nostril twice a day as needed for nasal congestion.  ?Nasal saline spray (i.e., Simply Saline) or nasal saline lavage (i.e., NeilMed) is recommended as needed and prior to medicated nasal sprays. ? ?Follow up in 2 months or sooner if needed.   ? ?Control of House Dust Mite Allergen ?Dust mite allergens are a common trigger of allergy and asthma symptoms. While they can be found throughout the house, these microscopic creatures thrive in warm, humid environments such as bedding, upholstered furniture and carpeting. ?Because so much  time is spent in the bedroom, it is essential to reduce mite levels there.  ?Encase pillows, mattresses, and box springs in special allergen-proof fabric covers or airtight, zippered plastic covers.  ?Bedding should be washed weekly in hot water (130? F) and dried in a hot dryer. Allergen-proof covers are available for comforters and pillows that can?t be regularly washed.  ?Wash the allergy-proof covers every few months. Minimize clutter in the bedroom. Keep pets out of the bedroom.  ?Keep humidity less than 50% by using a dehumidifier or air conditioning. You can buy a humidity measuring device called a hygrometer to monitor this.  ?If possible, replace carpets with hardwood, linoleum, or washable area rugs. If that's not possible, vacuum frequently with a vacuum that has a HEPA filter. ?Remove all upholstered furniture and non-washable window drapes from the bedroom. ?Remove all non-washable stuffed toys from the bedroom.  Wash stuffed toys weekly. ?Mold Control ?Mold and fungi can grow on a variety of surfaces provided certain temperature and moisture conditions exist.  ?Outdoor molds grow on plants, decaying vegetation and soil. The major outdoor mold, Alternaria and Cladosporium, are found in very high numbers during hot and dry conditions. Generally, a late summer - fall peak is seen for common outdoor fungal spores. Rain will temporarily lower outdoor mold spore count, but counts rise rapidly when the rainy period ends. ?The most important indoor molds are Aspergillus and Penicillium. Dark, humid and poorly ventilated basements are ideal sites for mold growth. The next most common sites of mold growth are the bathroom and the kitchen. ?Outdoor (Seasonal) Mold Control ?Use air conditioning and keep windows closed. ?Avoid exposure to decaying vegetation. ?Avoid leaf  raking. ?Avoid grain handling. ?Consider wearing a face mask if working in moldy areas.  ?Indoor (Perennial) Mold Control  ?Maintain humidity  below 50%. ?Get rid of mold growth on hard surfaces with water, detergent and, if necessary, 5% bleach (do not mix with other cleaners). Then dry the area completely. If mold covers an area more than 10 square feet, consider hiring an indoor environmental professional. ?For clothing, washing with soap and water is best. If moldy items cannot be cleaned and dried, throw them away. ?Remove sources e.g. contaminated carpets. ?Repair and seal leaking roofs or pipes. Using dehumidifiers in damp basements may be helpful, but empty the water and clean units regularly to prevent mildew from forming. All rooms, especially basements, bathrooms and kitchens, require ventilation and cleaning to deter mold and mildew growth. Avoid carpeting on concrete or damp floors, and storing items in damp areas. ?Cockroach Allergen Avoidance ?Cockroaches are often found in the homes of densely populated urban areas, schools or commercial buildings, but these creatures can lurk almost anywhere. This does not mean that you have a dirty house or living area. ?Block all areas where roaches can enter the home. This includes crevices, wall cracks and windows.  ?Cockroaches need water to survive, so fix and seal all leaky faucets and pipes. Have an exterminator go through the house when your family and pets are gone to eliminate any remaining roaches. ?Keep food in lidded containers and put pet food dishes away after your pets are done eating. Vacuum and sweep the floor after meals, and take out garbage and recyclables. Use lidded garbage containers in the kitchen. Wash dishes immediately after use and clean under stoves, refrigerators or toasters where crumbs can accumulate. Wipe off the stove and other kitchen surfaces and cupboards regularly. ? ?

## 2021-08-01 ENCOUNTER — Other Ambulatory Visit: Payer: Self-pay | Admitting: Internal Medicine

## 2021-08-02 DIAGNOSIS — Z9989 Dependence on other enabling machines and devices: Secondary | ICD-10-CM | POA: Diagnosis not present

## 2021-08-02 DIAGNOSIS — G4733 Obstructive sleep apnea (adult) (pediatric): Secondary | ICD-10-CM | POA: Diagnosis not present

## 2021-08-05 ENCOUNTER — Telehealth: Payer: Self-pay | Admitting: Pharmacist

## 2021-08-05 NOTE — Chronic Care Management (AMB) (Signed)
? ? ?  Chronic Care Management ?Pharmacy Assistant  ? ?Name: Amy Stokes  MRN: [id number] DOB: 04/09/1950 ? ?08/06/2021 APPOINTMENT REMINDER ? ?Amy Stokes was reminded to have all medications, supplements and any blood glucose and blood pressure readings available for review with [name], Pharm. D, at her telephone visit on 08/06/2021 at 1:00. ? ?Care Gaps: ?AWV - scheduled  09/10/21 ?Last BP: 124/74 on 07/29/2021 ?Hep C screen - never done ?Papsmear - postponed ? ?Star Rating Drug: ?Losartan 100 mg - last filled 07/25/2021 90 DS at [facility] ? ?Any gaps in medications fill history? No ? ?Gennie Alma CMA  ?Clinical Pharmacist Assistant ?[phone] ? ?

## 2021-08-06 ENCOUNTER — Ambulatory Visit (INDEPENDENT_AMBULATORY_CARE_PROVIDER_SITE_OTHER): Payer: Medicare HMO | Admitting: Pharmacist

## 2021-08-06 DIAGNOSIS — I10 Essential (primary) hypertension: Secondary | ICD-10-CM

## 2021-08-06 DIAGNOSIS — J45909 Unspecified asthma, uncomplicated: Secondary | ICD-10-CM

## 2021-08-06 NOTE — Patient Instructions (Signed)
Hi Amy Stokes, ? ?It was great to catch up again! I am glad you are doing much better!  ? ?Please reach out to me if you have any questions or need anything! ? ?Best, ?Maddie ? ?[name], PharmD, BCACP ?Clinical Pharmacist ?Therapist, [facility] at [place] ?[phone] ? ? Visit Information ? ? Goals Addressed   ?None ?  ? ?Patient Care Plan: Amy Stokes  ?  ? ?Problem Identified: Problem: Hypertension, Hyperlipidemia, GERD, Asthma, Hypothyroidism, Allergic Rhinitis and Jaw pain   ?  ? ?Long-Range Goal: Patient-Specific Goal   ?Start Date: 08/07/2020  ?Expected End Date: 08/07/2021  ?Recent Progress: On track  ?Priority: High  ?Note:   ?Current Barriers:  ?Unable to independently monitor therapeutic efficacy ?Unable to achieve control of blood pressure  ? ?Pharmacist Clinical Goal(s):  ?Patient will achieve adherence to monitoring guidelines and medication adherence to achieve therapeutic efficacy ?achieve control of blood pressure as evidenced by home blood pressure readings  through collaboration with PharmD and provider.  ? ?Interventions: ?1:1 collaboration with Panosh, [name], MD regarding development and update of comprehensive plan of care as evidenced by provider attestation and co-signature ?Inter-disciplinary care team collaboration (see longitudinal plan of care) ?Comprehensive medication review performed; medication list updated in electronic medical record ? ?Hypertension (BP goal <130/80) ?-Controlled ?-Current treatment: ?Losartan 100 mg 1 tablet daily - in PM (2:30-4pm) - Appropriate, Effective, Safe, Accessible ?Hydrochlorothiazide 25 mg 1 capsule daily - in AM - Appropriate, Effective, Safe, Accessible ?Diltiazem 300 mg 1 capsule daily - in AM - Appropriate, Effective, Safe, Accessible ?Potassium daily  - Appropriate, Effective, Safe, Accessible ?-Medications previously tried: none ?-Current home readings: 118/64, 124/60, 140/63, 125/56, 124/61/ 110/60, 124/60, 130/64, 134/65 (usually  around 120s) ?-Current dietary habits: did not discuss ?-Current exercise habits: patient joined [employer] and is going 3 days a week (cross trainer x 15 minutes, weight bearing for arms & legs, core exercises) ?-Denies hypotensive/hypertensive symptoms ?-Educated on Exercise goal of 150 minutes per week; ?Importance of home blood pressure monitoring; ?Proper BP monitoring technique; ?-Counseled to monitor BP at home twice weekly, document, and provide log at future appointments ?-Counseled on diet and exercise extensively ?Recommended to continue current medication ? ?Hyperlipidemia: (LDL goal < 100) ?-Uncontrolled ?-Current treatment: ?No medications ?-Medications previously tried: none (records indicate Lipitor and Mevacor but patient denies ever taking these)   ?-Current dietary patterns: got an air fryer; a lot less cooking oil; eating fish more often; trying to eat less overall; cut out fried foods and no red meat ?-Current exercise habits: patient joined [employer] and is going 3 days a week (cross trainer x 15 minutes, weight bearing for arms & legs, core exercises), water yoga on Wednesdays ?-Educated on Cholesterol goals;  ?Benefits of statin for ASCVD risk reduction; ?Importance of limiting foods high in cholesterol; ?Exercise goal of 150 minutes per week; ?-Counseled on diet and exercise extensively ? ?Atrial Fibrillation (Goal: prevent stroke and major bleeding) ?-Controlled ?-CHADSVASC: 3 ?-Current treatment: ?Rate/rhythm control: Diltiazem 300 mg 1 capsule daily - Appropriate, Effective, Safe, Accessible; flecainide 50 mg 1 tablet twice daily - Appropriate, Effective, Safe, Accessible ?Anticoagulation: Eliquis 5 mg 1 tablet twice daily - Appropriate, Effective, Safe, Query accessible ?-Medications previously tried: none ?-Home BP and HR readings: refer to above  ?-Counseled on increased risk of stroke due to Afib and benefits of anticoagulation for stroke prevention; ?bleeding risk associated with  Eliquis and importance of self-monitoring for signs/symptoms of bleeding; ?avoidance of NSAIDs due to increased bleeding risk  with anticoagulants; ?-Assessed patient finances. Patient will apply again for Eliquis PAP.[name] ? ?Asthma (Goal: control symptoms) ?-Controlled ?-Current treatment  ?Breztri 2 puffs twice daily - Appropriate, Effective, Safe, Query accessible ?Albuterol HFA 1 puff every 6 hours as needed - Appropriate, Effective, Safe, Accessible ?-Medications previously tried: Advair  ?-Pulmonary function testing: n/a ?-Patient reports consistent use of maintenance inhaler ?-Frequency of rescue inhaler use: a couple of times within the past few weeks ?-Counseled on Benefits of consistent maintenance inhaler use ?When to use rescue inhaler ?-Recommended to continue current medication ?Assessed patient finances. Patient will quality for Breztri PAP. ? ?Hypothyroidism (Goal: TSH 0.35-4.5) ?-Controlled ?-Current treatment  ?Levothyroxine 100 mcg 1 tablet daily - Appropriate, Effective, Safe, Accessible ?-Medications previously tried: none  ?-Recommended to continue current medication ? ?GERD (Goal: minimize symptoms) ?-Controlled ?-Current treatment  ?Pantoprazole 40 mg 1 tablet daily - Appropriate, Effective, Safe, Accessible ?-Medications previously tried: omeprazole (no longer needed)  ?-Recommended to continue current medication ? ?Allergic rhinitis (Goal: minimize symptoms) ?-Controlled ?-Current treatment  ?Allegra 180 mg 1 tablet daily - Appropriate, Effective, Safe, Accessible ?Montelukast 10 mg 1 tablet daily - Appropriate, Effective, Safe, Accessible ?-Medications previously tried: none  ?-Counseled on avoidance of triggers (dust) ? ?Jaw pain (Goal: minimize pain) ?-Controlled ?-Current treatment  ?Acetaminophen 650 mg 2 tablet as needed (before bed for jaw pain) - Appropriate, Effective, Safe, Accessible ?-Medications previously tried: none  ?-Recommended to continue current medication ?Counseled on  maximum recommended dose of 3,000 mg/day of tylenol; discussed that Tylenol is found in many over the counter products (especially cough and cold medicine)  ? ? ?Health Maintenance ?-Vaccine gaps: none ?-Current therapy:  ?Multivitamin 1 tablet daily ?Vitamin D 2000 units daily ?Optifiber/benefiber daily ?-Educated on Cost vs benefit of each product must be carefully weighed by individual consumer ?-Patient is satisfied with current therapy and denies issues ?-Recommended to continue current medication ?Counseled on avoidance of supplementation with vitamin C due to recent kidney stone ? ? ?Patient Goals/Self-Care Activities ?Patient will:  ?- take medications as prescribed ?check blood pressure twice weekly, document, and provide at future appointments ?target a minimum of 150 minutes of moderate intensity exercise weekly ? ?Follow Up Plan: Telephone follow up appointment with care management team member scheduled for: 6 months ? ?  ?  ? ?Patient verbalizes understanding of instructions and care plan provided today and agrees to view in [place]. Active MyChart status confirmed with patient.   ?The pharmacy team will reach out to the patient again over the next 14 days.  ? ?[name], RPH  ?

## 2021-08-06 NOTE — Progress Notes (Signed)
? ?Chronic Care Management ?Pharmacy Note ? ?08/06/2021 ?Name:  Amy Stokes MRN:  [id number] DOB:  1950-05-16 ? ?Summary: ?BP at goal < 130/80 ?Pt reports her wheezing and coughing have improved ?  ?Recommendations/Changes made from today's visit: ?-Recommended bringing BP cuff to office visit to ensure accuracy ?-Recommended moderate intensity statin therapy per moderate ASCVD risk ?  ?Plan: ?Apply for Breztri and Eliquis PAP ?Follow up BP assessment in 2 months ? ?Subjective: ?Amy Stokes is an 71 y.o. year old female who is a primary patient of Panosh, [name], MD.  The CCM team was consulted for assistance with disease management and care coordination needs.   ? ?Engaged with patient by telephone for follow up visit in response to provider referral for pharmacy case management and/or care coordination services.  ? ?Consent to Services:  ?The patient was given information about Chronic Care Management services, agreed to services, and gave verbal consent prior to initiation of services.  Please see initial visit note for detailed documentation.  ? ?Patient Care Team: ?Panosh, [name], MD as PCP - General ?Amy Dresser, MD as PCP - Cardiology (Cardiology) ?[name], MD as PCP - Electrophysiology (Cardiology) ?[name], MD as Consulting Physician (Endocrinology) ?[name], MD as Consulting Physician (Pulmonary Disease) ?[name], MD as Consulting Physician (Obstetrics and Gynecology) ?[name], [facility] as Pharmacist (Pharmacist) ? ?Recent office visits: ?05/07/21 Amy Ace, MD: Patient presented for throat irritation. Referred to ENT. ? ?03/08/2021 Amy Littler MD - Patient was seen for Community acquired pneumonia of right middle lobe of lung. No medication changes. No follow up noted.  ? ?Recent consult visits: ?07/29/21 Amy Alberts, DO (asthma & allergy): Patient presented to establish care for not well controlled asthma. Sampled Breztri to replace  Advair. Follow up in 2 months. ? ?07/23/21 Amy Him, MD (cardiology): Patient underwent a sleep study. ? ?07/11/21 Izora Gala, MD (ENT): Patient presented for initial consult for globus pharyngeus. ? ?07/05/21 Amy Henle, NP (pulmonary): Patient presented for wheezing. No medication changes. ? ?06/04/21 Amy Henle, NP (pulmonary): Patient presented for wheezing and asthma follow up. Prescribed montelukast 10 mg at bedtime. Switched Advair diskus to Advair HFA. ? ?06/03/21 Amy Lai, MD (cardiology): Patient presented for Afib follow up. BP elevated in office. Referred to weight loss clinic. Started Lasix 20 mg daily for the next 4 days, then restart hydrochlorothiazide. Follow up in 4 months. ? ?02/18/2021 Amy Dresser MD (cardiology) - Patient was seen for Paroxysmal atrial fibrillation and additional issues. Discontinued Acular and Loratadine. Follow up in 6 months. ? ?Hospital visits: ?Admitted to [facility] on 02/25/2021 due to Right Middle Lobe Pneumonia. Discharge date was 02/28/2021.  ?New?Medications Started at [facility] Discharge:?? ?doxycycline (VIBRA-TABS) ?ipratropium-albuterol (DUONEB) ?predniSONE (DELTASONE) ?Medication Changes at Hospital Discharge: ?No medication changes ?Medications Discontinued at Hospital Discharge: ?No medications discontinued ?Medications that remain the same after Hospital Discharge:??  ?-All other medications will remain the same.   ? ?Objective: ? ?Lab Results  ?Component Value Date  ? CREATININE 0.69 02/28/2021  ? BUN 22 02/28/2021  ? GFR 90.27 07/02/2020  ? GFRNONAA >60 02/28/2021  ? GFRAA 95 05/10/2008  ? NA 137 02/28/2021  ? K 4.9 02/28/2021  ? CALCIUM 8.7 (L) 02/28/2021  ? CO2 34 (H) 02/28/2021  ? GLUCOSE 150 (H) 02/28/2021  ? ? ?Lab Results  ?Component Value Date/Time  ? HGBA1C 5.7 (A) 10/24/2020 12:35 PM  ? HGBA1C 6.0 06/10/2019 10:16 AM  ? HGBA1C 6.0 11/04/2017 11:56  AM  ? GFR 90.27 07/02/2020 08:05 AM  ? GFR 101.30 06/10/2019 10:16  AM  ?  ?Last diabetic Eye exam: No results found for: HMDIABEYEEXA  ?Last diabetic Foot exam: No results found for: HMDIABFOOTEX  ? ?Lab Results  ?Component Value Date  ? CHOL 174 07/02/2020  ? HDL 39.30 07/02/2020  ? LDLCALC 116 (H) 07/02/2020  ? LDLDIRECT 112 (H) 01/10/2021  ? TRIG 95.0 07/02/2020  ? CHOLHDL 4 07/02/2020  ? ? ? ?  Latest Ref Rng & Units 02/26/2021  ?  4:21 AM 01/10/2021  ? 10:16 AM 05/10/2020  ?  8:22 AM  ?Hepatic Function  ?Total Protein 6.5 - 8.1 g/dL 7.2   6.6   7.2    ?Albumin 3.5 - 5.0 g/dL 3.1   3.8   3.3    ?AST 15 - 41 U/L _0 ?ALT 0 - 44 U/L _1 ?Alk Phosphatase 38 - 126 U/L 72   91   89    ?Total Bilirubin 0.3 - 1.2 mg/dL 0.4   0.4   0.6    ?Bilirubin, Direct 0.0 - 0.2 mg/dL   <0.1    ? ? ?Lab Results  ?Component Value Date/Time  ? TSH 2.00 10/12/2020 12:00 AM  ? TSH 2.21 06/10/2019 10:16 AM  ? TSH 1.95 11/04/2017 11:56 AM  ? FREET4 0.82 04/19/2015 09:47 AM  ? FREET4 1.0 07/16/2009 03:14 PM  ? ? ? ?  Latest Ref Rng & Units 06/04/2021  ? 10:55 AM 02/26/2021  ?  4:21 AM 02/25/2021  ?  8:00 PM  ?CBC  ?WBC 4.0 - 10.5 K/uL 6.6   9.9   12.0    ?Hemoglobin 12.0 - 15.0 g/dL 12.6   11.9   12.4    ?Hematocrit 36.0 - 46.0 % 38.2   38.1   39.1    ?Platelets 150.0 - 400.0 K/uL 251.0   257   272    ? ? ?Lab Results  ?Component Value Date/Time  ? VD25OH 38.91 04/19/2015 09:47 AM  ? ? ?CHA2DS2/VAS Stroke Risk Points  Current as of 5 hours ago  ?   3 >= 2 Points: High Risk  ?1 - 1.99 Points: Medium Risk  ?0 Points: Low Risk  ?  No Change   ?  ? Details   ? This score determines the patient's risk of having a stroke if the  ?patient has atrial fibrillation.  ?  ?  ? Points Metrics  ?0 Has Congestive Heart Failure:  No   ? Current as of 5 hours ago  ?0 Has Vascular Disease:  No   ? Current as of 5 hours ago  ?1 Has Hypertension:  Yes   ? Current as of 5 hours ago  ?1 Age:  44   ? Current as of 5 hours ago  ?0 Has Diabetes:  No   ? Current as of 5 hours ago  ?0 Had Stroke:  No  Had TIA:   No  Had Thromboembolism:  No   ? Current as of 5 hours ago  ?1 Female:  Yes   ? Current as of 5 hours ago  ?  ?  ?  ? ? ?Clinical ASCVD: No  ?The 10-year ASCVD risk score (Arnett DK, et al., 2019) is: 12.1% ?  Values used to calculate the score: ?    Age: 33 years ?  Sex: Female ?    Is Non-Hispanic African American: No ?    Diabetic: No ?    Tobacco smoker: No ?    Systolic Blood Pressure: 868 mmHg ?    Is BP treated: Yes ?    HDL Cholesterol: 39.3 mg/dL ?    Total Cholesterol: 174 mg/dL   ? ? ?  05/07/2021  ? 11:01 AM 08/28/2020  ?  8:16 AM 05/14/2020  ?  3:10 PM  ?Depression screen PHQ 2/9  ?Decreased Interest 0 0 0  ?Down, Depressed, Hopeless 0 0 0  ?PHQ - 2 Score 0 0 0  ?Altered sleeping 0    ?Tired, decreased energy 0    ?Change in appetite 0    ?Feeling bad or failure about yourself  0    ?Trouble concentrating 0    ?Moving slowly or fidgety/restless 0    ?Suicidal thoughts 0    ?PHQ-9 Score 0    ?  ? ? ?Social History  ? ?Tobacco Use  ?Smoking Status Former  ? Packs/day: 1.00  ? Years: 1.00  ? Pack years: 1.00  ? Types: Cigarettes  ? Quit date: 04/29/1971  ? Years since quitting: 50.3  ? Passive exposure: Never  ?Smokeless Tobacco Never  ? ?BP Readings from Last 3 Encounters:  ?07/29/21 124/74  ?07/05/21 122/62  ?06/04/21 126/64  ? ?Pulse Readings from Last 3 Encounters:  ?07/29/21 90  ?07/05/21 74  ?06/04/21 75  ? ?Wt Readings from Last 3 Encounters:  ?07/29/21 281 lb 12.8 oz (127.8 kg)  ?07/23/21 280 lb (127 kg)  ?07/05/21 288 lb 6.4 oz (130.8 kg)  ? ?BMI Readings from Last 3 Encounters:  ?07/29/21 42.85 kg/m?  ?07/23/21 42.57 kg/m?  ?07/05/21 43.85 kg/m?  ? ? ?Assessment/Interventions: Review of patient past medical history, allergies, medications, health status, including review of consultants reports, laboratory and other test data, was performed as part of comprehensive evaluation and provision of chronic care management services.  ? ?SDOH:  (Social Determinants of Health) assessments and interventions  performed: No ? ?SDOH Screenings  ? ?Alcohol Screen: Low Risk   ? Last Alcohol Screening Score (AUDIT): 1  ?Depression (PHQ2-9): Low Risk   ? PHQ-2 Score: 0  ?Financial Resource Strain: Low Risk   ? Diffi

## 2021-08-07 ENCOUNTER — Other Ambulatory Visit: Payer: Self-pay

## 2021-08-07 MED ORDER — BREZTRI AEROSPHERE 160-9-4.8 MCG/ACT IN AERO: 2.0000 | INHALATION_SPRAY | Freq: Two times a day (BID) | RESPIRATORY_TRACT | 3 refills | Status: DC

## 2021-08-28 ENCOUNTER — Encounter (HOSPITAL_BASED_OUTPATIENT_CLINIC_OR_DEPARTMENT_OTHER): Payer: Self-pay | Admitting: Family

## 2021-08-28 ENCOUNTER — Ambulatory Visit (HOSPITAL_BASED_OUTPATIENT_CLINIC_OR_DEPARTMENT_OTHER): Payer: Medicare HMO | Admitting: Family

## 2021-08-28 VITALS — BP 138/70 | HR 74 | Ht 68.0 in | Wt 281.8 lb

## 2021-08-28 DIAGNOSIS — E785 Hyperlipidemia, unspecified: Secondary | ICD-10-CM | POA: Diagnosis not present

## 2021-08-28 DIAGNOSIS — Z87891 Personal history of nicotine dependence: Secondary | ICD-10-CM | POA: Diagnosis not present

## 2021-08-28 DIAGNOSIS — J45909 Unspecified asthma, uncomplicated: Secondary | ICD-10-CM | POA: Diagnosis not present

## 2021-08-28 DIAGNOSIS — E039 Hypothyroidism, unspecified: Secondary | ICD-10-CM

## 2021-08-28 DIAGNOSIS — I4891 Unspecified atrial fibrillation: Secondary | ICD-10-CM

## 2021-08-28 DIAGNOSIS — I10 Essential (primary) hypertension: Secondary | ICD-10-CM

## 2021-08-28 DIAGNOSIS — I480 Paroxysmal atrial fibrillation: Secondary | ICD-10-CM | POA: Diagnosis not present

## 2021-08-28 DIAGNOSIS — D6859 Other primary thrombophilia: Secondary | ICD-10-CM | POA: Diagnosis not present

## 2021-08-28 NOTE — Progress Notes (Signed)
2 box of Eliquis '5mg'$  Samples given   Lot number: [id number] Exp: Mar 25

## 2021-08-28 NOTE — Patient Instructions (Signed)
Medication Instructions:  Continue your current medications.   *If you need a refill on your cardiac medications before your next appointment, please call your pharmacy*  Lab Work: None ordered today.   Testing/Procedures: None ordered today.   Follow-Up: At [facility], you and your health needs are our priority.  As part of our continuing mission to provide you with exceptional heart care, we have created designated Provider Care Teams.  These Care Teams include your primary Cardiologist (physician) and Advanced Practice Providers (APPs -  Physician Assistants and Nurse Practitioners) who all work together to provide you with the care you need, when you need it.  We recommend signing up for the patient portal called "MyChart".  Sign up information is provided on this After Visit Summary.  MyChart is used to connect with patients for Virtual Visits (Telemedicine).  Patients are able to view lab/test results, encounter notes, upcoming appointments, etc.  Non-urgent messages can be sent to your provider as well.   To learn more about what you can do with MyChart, go to [email or web address].    Your next appointment:   6 month(s)  The format for your next appointment:   In Person  Provider:   [name], MD or Loel Dubonnet, NP     Other Instructions  Heart Healthy Diet Recommendations: A low-salt diet is recommended. Meats should be grilled, baked, or boiled. Avoid fried foods. Focus on lean protein sources like fish or chicken with vegetables and fruits. The American Heart Association is a Microbiologist!  American Heart Association Diet and Lifeystyle Recommendations  Exercise recommendations: The American Heart Association recommends 150 minutes of moderate intensity exercise weekly. Try 30 minutes of moderate intensity exercise 4-5 times per week. This could include walking, jogging, or swimming.   Important Information About Sugar

## 2021-08-28 NOTE — Progress Notes (Signed)
Office Visit    Patient Name: Amy Stokes Date of Encounter: 08/28/2021  PCP:  Burnis Medin, MD   [place]  Cardiologist:  [name], MD  Advanced Practice Provider:  No care team member to display Electrophysiologist:  Will Meredith Leeds, MD      Chief Complaint    Amy Stokes is a 71 y.o. female with a hx of atrial fibrillation, HTN, asthma, OSA, obesity presents today for follow up of atrial fibrillation, hypertension.  Past Medical History    Past Medical History:  Diagnosis Date   Arthritis    no meds   Asthma    Edema    Fibroids    Hyperlipidemia    Hypertension    Obesity    RML pneumonia 02/25/2021   Sleep apnea    wears cpap    Thyroid nodule 01/2009   left discovered on chest ct; needle bx "indeterminant" cannon r/o follicular neoplasm    Past Surgical History:  Procedure Laterality Date   ABDOMINAL HYSTERECTOMY Bilateral 08/02/2013   Procedure: TOTAL ABDOMINAL HYSTERECTOMY WITH BILATERAL SALPINGO-OOPHORECTOMY ;  Surgeon: Terrance Mass, MD;  Location: [facility];  Service: Gynecology;  Laterality: Bilateral;    Dr. Phineas Real to assist.  Dr. Zella Richer to follow with an umbilical hernia repair. He will need an additional 1 1/2 hours.    COLONOSCOPY  12/27/2007   [facility]    HERNIA REPAIR     MOUTH SURGERY     thyroid needle bx     TONSILLECTOMY     UMBILICAL HERNIA REPAIR N/A 08/02/2013   Procedure: HERNIA REPAIR UMBILICAL ADULT WITH MESH;  Surgeon: Odis Hollingshead, MD;  Location: [facility];  Service: General;  Laterality: N/A;    Allergies  No Known Allergies  History of Present Illness    Amy Stokes is a 71 y.o. female with a hx of atrial fibrillation, HTN, asthma, OSA, obesity last seen 06/03/21 by Dr. Curt Bears.  Established September 2022 with Dr. Harrell Gave due to atrial fibrillation. She was compliant with her sleep apnea and wearing CPAP regularly. Symptoms were suggestive of parosyxmal  atrial fibrillation and EKG confirmed atrial fibrillation. She was started on Eliquis '5mg'$  BID as well as Diltiazem '120mg'$  QD. Beta blocker was avoided due to history of asthma.   Her atrial fibrillation was persistent and as such she was referred to EP.  Her diltiazem was gradually uptitrated to 300 mg daily and she was started on flecainide 50 mg twice daily with improved control.  Last seen 06/03/2021 by Dr. Curt Bears.  She was maintaining sinus rhythm..  Due to bilateral 1-2+ edema she was started on Lasix 20 mg daily for 4 days then to return to hydrochlorothiazide.  She presents today for follow-up independently. Her SBP at home is routinely 120s-130s. BP by her home arm cuff checked in clinic today 145/69.  It is found to read 7 points higher than manual reading. She is working very hard on weight loss by monitoring her diet and exercising regularly at [employer]. Has lost nearly 30 pounds over the last 6 months. No recent episodes of atrial fibrillation. Last episode in April per her FitBit which lasted about an hour and occurred during sleep and was asymptomatic. Reports no shortness of breath nor dyspnea on exertion. Reports no chest pain, pressure, or tightness. No edema, orthopnea, PND. Reports no palpitations.    EKGs/Labs/Other Studies Reviewed:   The following studies were reviewed today:  Echo 11/01/20  1. Left ventricular ejection fraction, by estimation, is 55 to 60%. The  left ventricle has normal function. The left ventricle has no regional  wall motion abnormalities. Left ventricular diastolic parameters are  indeterminate.   2. Right ventricular systolic function is normal. The right ventricular  size is mildly enlarged. Tricuspid regurgitation signal is inadequate for  assessing PA pressure.   3. The mitral valve is grossly normal. No evidence of mitral valve  regurgitation. No evidence of mitral stenosis.   4. The aortic valve is grossly normal. Aortic valve regurgitation is not   visualized. No aortic stenosis is present.   5. The inferior vena cava is dilated in size with >50% respiratory  variability, suggesting right atrial pressure of 8 mmHg.   EKG:  No EKG today.   Recent Labs: 10/12/2020: TSH 2.00 02/26/2021: ALT 18 02/28/2021: BUN 22; Creatinine, Ser 0.69; Magnesium 2.3; Potassium 4.9; Sodium 137 06/04/2021: Hemoglobin 12.6; Platelets 251.0  Recent Lipid Panel    Component Value Date/Time   CHOL 174 07/02/2020 0805   TRIG 95.0 07/02/2020 0805   HDL 39.30 07/02/2020 0805   CHOLHDL 4 07/02/2020 0805   VLDL 19.0 07/02/2020 0805   LDLCALC 116 (H) 07/02/2020 0805   LDLDIRECT 112 (H) 01/10/2021 1016   LDLDIRECT 141.7 04/03/2011 1043    Risk Assessment/Calculations:   CHA2DS2-VASc Score = 3  This indicates a 3.2% annual risk of stroke. The patient's score is based upon: CHF History: 0 HTN History: 1 Diabetes History: 0 Stroke History: 0 Vascular Disease History: 0 Age Score: 1 Gender Score: 1   Home Medications   Current Meds  Medication Sig   acetaminophen (TYLENOL) 650 MG CR tablet Take 1,300 mg by mouth every 8 (eight) hours as needed for pain.   albuterol (PROAIR HFA) 108 (90 Base) MCG/ACT inhaler Inhale 2 puffs into the lungs every 6 (six) hours as needed for wheezing.   apixaban (ELIQUIS) 5 MG TABS tablet Take 1 tablet (5 mg total) by mouth 2 (two) times daily.   Budeson-Glycopyrrol-Formoterol (BREZTRI AEROSPHERE) 160-9-4.8 MCG/ACT AERO Inhale 2 puffs into the lungs in the morning and at bedtime. with spacer and rinse mouth afterwards.   Cholecalciferol 50 MCG (2000 UT) CAPS Take 1 capsule by mouth daily. PATIENT USING OIL AND NOT CAPSULES   diltiazem (CARDIZEM CD) 300 MG 24 hr capsule TAKE 1 CAPSULE BY MOUTH EVERY DAY   fexofenadine (ALLEGRA ALLERGY) 180 MG tablet Take 1 tablet (180 mg total) by mouth daily.   flecainide (TAMBOCOR) 50 MG tablet TAKE 1 TABLET BY MOUTH TWICE A DAY   fluticasone (FLONASE) 50 MCG/ACT nasal spray Place 1 spray  into both nostrils daily.   hydrochlorothiazide (HYDRODIURIL) 25 MG tablet TAKE 1 TABLET (25 MG TOTAL) BY MOUTH DAILY.   ipratropium-albuterol (DUONEB) 0.5-2.5 (3) MG/3ML SOLN Take 3 mLs by nebulization every 6 (six) hours as needed.   KLOR-CON M10 10 MEQ tablet TAKE 2 TABLETS BY MOUTH DAILY   levothyroxine (SYNTHROID) 100 MCG tablet TAKE 1 TABLET BY MOUTH EVERY MORNING ON AN EMPTY STOMACH Orally Once a day 90 days   losartan (COZAAR) 100 MG tablet TAKE 1 TABLET BY MOUTH EVERY DAY   Lutein 10 MG TABS Take 10 mg by mouth daily.   montelukast (SINGULAIR) 10 MG tablet Take 1 tablet (10 mg total) by mouth at bedtime.   MULTIPLE VITAMIN PO Take 1 tablet by mouth daily.   pantoprazole (PROTONIX) 40 MG tablet Take 1 tablet (40 mg total) by mouth daily.  PSYLLIUM PO Take 1 packet by mouth daily.   Spacer/Aero-Holding Dorise Bullion Use with spacer     Review of Systems      All other systems reviewed and are otherwise negative except as noted above.  Physical Exam    VS:  BP 138/70 (BP Location: Left Arm, Patient Position: Sitting, Cuff Size: Large)   Pulse 74   Ht '5\' 8"'$  (1.727 m)   Wt 281 lb 12.8 oz (127.8 kg)   SpO2 97%   BMI 42.85 kg/m  , BMI Body mass index is 42.85 kg/m.  Wt Readings from Last 3 Encounters:  08/28/21 281 lb 12.8 oz (127.8 kg)  07/29/21 281 lb 12.8 oz (127.8 kg)  07/23/21 280 lb (127 kg)    GEN: Well nourished, overweight, well developed, in no acute distress. HEENT: normal. Neck: Supple, no JVD, carotid bruits, or masses. Cardiac: RRR, no murmurs, rubs, or gallops. No clubbing, cyanosis, edema.  Radials/PT 2+ and equal bilaterally.  Respiratory:  Respirations regular and unlabored, clear to auscultation bilaterally. GI: Soft, nontender, nondistended. MS: No deformity or atrophy. Skin: Warm and dry, no rash. Neuro:  Strength and sensation are intact. Psych: Normal affect.  Assessment & Plan    PAF / Chronic anticoagulation -Follows with Dr. Curt Bears.  Presently on Flecainide '50mg'$  BID, Diltaizem '300mg'$  QD.  Tolerating Eliquis without bleeding complications. 05/2021 Hb 1.6, Hct 38.2. Continue Eliquis '5mg'$  BID - she will bring patient assistance paperwork back for [place] to fax. 2 bottles samples provided today.  CHA2DS2-VASc Score = 3 [CHF History: 0, HTN History: 1, Diabetes History: 0, Stroke History: 0, Vascular Disease History: 0, Age Score: 1, Gender Score: 1].  Therefore, the patient's annual risk of stroke is 3.2 %.    Flecainide monitoring: 02/2021 magnesium 2.3, K 4.9.  HTN -She will continue hydrochlorothiazide 25 mg daily, losartan 100 mg daily, Diltaizem '300mg'$  daily. BP at hom 120s-130s on her home arm cuff which is found to read 7 points higher systolic versus manual reading. As well controlled at home will continue current antihypertensive regimen.   HLD -06/2020 LDL 116. Updated labs upcoming with primary care. Will reassess ASCVD score at that time as anticipate much improved with her recent weight loss. If ASCVD score elevated, plan to add Rosuvastatin.   OSA - CPAP compliance encouraged.   Obesity - Weight loss via diet and exercise encouraged. Discussed the impact being overweight would have on cardiovascular risk.  She is very motivated to lose weight and has been successful in losing nearly 30 pounds with exercising at [employer].  Disposition: Follow up in 6 months with Dr. Harrell Gave .  Signed, Loel Dubonnet, NP 08/28/2021, 8:47 AM [place]

## 2021-09-02 DIAGNOSIS — G4733 Obstructive sleep apnea (adult) (pediatric): Secondary | ICD-10-CM | POA: Diagnosis not present

## 2021-09-02 DIAGNOSIS — Z9989 Dependence on other enabling machines and devices: Secondary | ICD-10-CM | POA: Diagnosis not present

## 2021-09-05 ENCOUNTER — Telehealth (HOSPITAL_BASED_OUTPATIENT_CLINIC_OR_DEPARTMENT_OTHER): Payer: Self-pay

## 2021-09-05 NOTE — Telephone Encounter (Signed)
Eliquis patient assistance application faxed to [facility] for review.

## 2021-09-09 NOTE — Telephone Encounter (Signed)
Received fax from [facility] that part of patient assistance was missing. Refaxed application.

## 2021-09-10 ENCOUNTER — Ambulatory Visit (INDEPENDENT_AMBULATORY_CARE_PROVIDER_SITE_OTHER): Payer: Medicare HMO

## 2021-09-10 ENCOUNTER — Telehealth: Payer: Self-pay | Admitting: Family

## 2021-09-10 VITALS — BP 122/60 | HR 72 | Temp 97.8°F | Ht 67.5 in | Wt 282.1 lb

## 2021-09-10 DIAGNOSIS — E782 Mixed hyperlipidemia: Secondary | ICD-10-CM | POA: Diagnosis not present

## 2021-09-10 DIAGNOSIS — Z0000 Encounter for general adult medical examination without abnormal findings: Secondary | ICD-10-CM | POA: Diagnosis not present

## 2021-09-10 NOTE — Patient Instructions (Signed)
[name] , Thank you for taking time to come for your Medicare Wellness Visit. I appreciate your ongoing commitment to your health goals. Please review the following plan we discussed and let me know if I can assist you in the future.   Screening recommendations/referrals: Colonoscopy: completed 05/28/2018 Mammogram: completed 01/23/2021, due 01/24/2022 Bone Density: completed 10/19/2014 Recommended yearly ophthalmology/optometry visit for glaucoma screening and checkup Recommended yearly dental visit for hygiene and checkup  Vaccinations: Influenza vaccine: due 10/29/2021 Pneumococcal vaccine: completed 04/11/2020 Tdap vaccine: completed 07/28/2013, due 07/29/2023 Shingles vaccine: completed   Covid-19: 01/08/2021, 07/10/2020, 11/03/2019, 12/15/2018, 11/18/2018  Advanced directives: Advance directive discussed with you today. Even though you declined this today please call our office should you change your mind and we can give you the proper paperwork for you to fill out.   Conditions/risks identified: none  Next appointment: Follow up in one year for your annual wellness visit    Preventive Care 65 Years and Older, Female Preventive care refers to lifestyle choices and visits with your health care provider that can promote health and wellness. What does preventive care include? A yearly physical exam. This is also called an annual well check. Dental exams once or twice a year. Routine eye exams. Ask your health care provider how often you should have your eyes checked. Personal lifestyle choices, including: Daily care of your teeth and gums. Regular physical activity. Eating a healthy diet. Avoiding tobacco and drug use. Limiting alcohol use. Practicing safe sex. Taking low-dose aspirin every day. Taking vitamin and mineral supplements as recommended by your health care provider. What happens during an annual well check? The services and screenings done by your health care provider  during your annual well check will depend on your age, overall health, lifestyle risk factors, and family history of disease. Counseling  Your health care provider may ask you questions about your: Alcohol use. Tobacco use. Drug use. Emotional well-being. Home and relationship well-being. Sexual activity. Eating habits. History of falls. Memory and ability to understand (cognition). Work and work Statistician. Reproductive health. Screening  You may have the following tests or measurements: Height, weight, and BMI. Blood pressure. Lipid and cholesterol levels. These may be checked every 5 years, or more frequently if you are over 22 years old. Skin check. Lung cancer screening. You may have this screening every year starting at age 58 if you have a 30-pack-year history of smoking and currently smoke or have quit within the past 15 years. Fecal occult blood test (FOBT) of the stool. You may have this test every year starting at age 11. Flexible sigmoidoscopy or colonoscopy. You may have a sigmoidoscopy every 5 years or a colonoscopy every 10 years starting at age 77. Hepatitis C blood test. Hepatitis B blood test. Sexually transmitted disease (STD) testing. Diabetes screening. This is done by checking your blood sugar (glucose) after you have not eaten for a while (fasting). You may have this done every 1-3 years. Bone density scan. This is done to screen for osteoporosis. You may have this done starting at age 52. Mammogram. This may be done every 1-2 years. Talk to your health care provider about how often you should have regular mammograms. Talk with your health care provider about your test results, treatment options, and if necessary, the need for more tests. Vaccines  Your health care provider may recommend certain vaccines, such as: Influenza vaccine. This is recommended every year. Tetanus, diphtheria, and acellular pertussis (Tdap, Td) vaccine. You may need a  Td booster every  10 years. Zoster vaccine. You may need this after age 52. Pneumococcal 13-valent conjugate (PCV13) vaccine. One dose is recommended after age 75. Pneumococcal polysaccharide (PPSV23) vaccine. One dose is recommended after age 80. Talk to your health care provider about which screenings and vaccines you need and how often you need them. This information is not intended to replace advice given to you by your health care provider. Make sure you discuss any questions you have with your health care provider. Document Released: 04/13/2015 Document Revised: 12/05/2015 Document Reviewed: 01/16/2015 Elsevier Interactive Patient Education  2017 [employer] Prevention in the Home Falls can cause injuries. They can happen to people of all ages. There are many things you can do to make your home safe and to help prevent falls. What can I do on the outside of my home? Regularly fix the edges of walkways and driveways and fix any cracks. Remove anything that might make you trip as you walk through a door, such as a raised step or threshold. Trim any bushes or trees on the path to your home. Use bright outdoor lighting. Clear any walking paths of anything that might make someone trip, such as rocks or tools. Regularly check to see if handrails are loose or broken. Make sure that both sides of any steps have handrails. Any raised decks and porches should have guardrails on the edges. Have any leaves, snow, or ice cleared regularly. Use sand or salt on walking paths during winter. Clean up any spills in your garage right away. This includes oil or grease spills. What can I do in the bathroom? Use night lights. Install grab bars by the toilet and in the tub and shower. Do not use towel bars as grab bars. Use non-skid mats or decals in the tub or shower. If you need to sit down in the shower, use a plastic, non-slip stool. Keep the floor dry. Clean up any water that spills on the floor as soon as it  happens. Remove soap buildup in the tub or shower regularly. Attach bath mats securely with double-sided non-slip rug tape. Do not have throw rugs and other things on the floor that can make you trip. What can I do in the bedroom? Use night lights. Make sure that you have a light by your bed that is easy to reach. Do not use any sheets or blankets that are too big for your bed. They should not hang down onto the floor. Have a firm chair that has side arms. You can use this for support while you get dressed. Do not have throw rugs and other things on the floor that can make you trip. What can I do in the kitchen? Clean up any spills right away. Avoid walking on wet floors. Keep items that you use a lot in easy-to-reach places. If you need to reach something above you, use a strong step stool that has a grab bar. Keep electrical cords out of the way. Do not use floor polish or wax that makes floors slippery. If you must use wax, use non-skid floor wax. Do not have throw rugs and other things on the floor that can make you trip. What can I do with my stairs? Do not leave any items on the stairs. Make sure that there are handrails on both sides of the stairs and use them. Fix handrails that are broken or loose. Make sure that handrails are as long as the stairways. Check any  carpeting to make sure that it is firmly attached to the stairs. Fix any carpet that is loose or worn. Avoid having throw rugs at the top or bottom of the stairs. If you do have throw rugs, attach them to the floor with carpet tape. Make sure that you have a light switch at the top of the stairs and the bottom of the stairs. If you do not have them, ask someone to add them for you. What else can I do to help prevent falls? Wear shoes that: Do not have high heels. Have rubber bottoms. Are comfortable and fit you well. Are closed at the toe. Do not wear sandals. If you use a stepladder: Make sure that it is fully opened.  Do not climb a closed stepladder. Make sure that both sides of the stepladder are locked into place. Ask someone to hold it for you, if possible. Clearly mark and make sure that you can see: Any grab bars or handrails. First and last steps. Where the edge of each step is. Use tools that help you move around (mobility aids) if they are needed. These include: Canes. Walkers. Scooters. Crutches. Turn on the lights when you go into a dark area. Replace any light bulbs as soon as they burn out. Set up your furniture so you have a clear path. Avoid moving your furniture around. If any of your floors are uneven, fix them. If there are any pets around you, be aware of where they are. Review your medicines with your doctor. Some medicines can make you feel dizzy. This can increase your chance of falling. Ask your doctor what other things that you can do to help prevent falls. This information is not intended to replace advice given to you by your health care provider. Make sure you discuss any questions you have with your health care provider. Document Released: 01/11/2009 Document Revised: 08/23/2015 Document Reviewed: 04/21/2014 Elsevier Interactive Patient Education  2017 [employer].

## 2021-09-10 NOTE — Telephone Encounter (Signed)
Per C. Gilford Rile, NP okay to order FLP, CMP

## 2021-09-10 NOTE — Progress Notes (Signed)
Subjective:   Amy Stokes is a 71 y.o. female who presents for Medicare Annual (Subsequent) preventive examination.  Review of Systems     Cardiac Risk Factors include: advanced age (>52mn, >>72women);dyslipidemia;obesity (BMI >30kg/m2);hypertension     Objective:    Today's Vitals   09/10/21 0818  BP: 122/60  Pulse: 72  Temp: 97.8 F (36.6 C)  TempSrc: Oral  SpO2: 90%  Weight: 282 lb 1.6 oz (128 kg)  Height: 5' 7.5" (1.715 m)   Body mass index is 43.53 kg/m.     09/10/2021    8:29 AM 07/23/2021    8:28 PM 02/25/2021   11:34 PM 02/25/2021    7:49 PM 08/28/2020    8:18 AM 07/08/2019    2:46 PM 05/17/2018    8:37 AM  Advanced Directives  Does Patient Have a Medical Advance Directive? No No No No No No Yes  Type of Advance Directive       Living will  Would patient like information on creating a medical advance directive? No - Patient declined No - Patient declined No - Patient declined  Yes (MAU/Ambulatory/Procedural Areas - Information given) No - Patient declined     Current Medications (verified) Outpatient Encounter Medications as of 09/10/2021  Medication Sig   acetaminophen (TYLENOL) 650 MG CR tablet Take 1,300 mg by mouth every 8 (eight) hours as needed for pain.   albuterol (PROAIR HFA) 108 (90 Base) MCG/ACT inhaler Inhale 2 puffs into the lungs every 6 (six) hours as needed for wheezing.   apixaban (ELIQUIS) 5 MG TABS tablet Take 1 tablet (5 mg total) by mouth 2 (two) times daily.   Budeson-Glycopyrrol-Formoterol (BREZTRI AEROSPHERE) 160-9-4.8 MCG/ACT AERO Inhale 2 puffs into the lungs in the morning and at bedtime. with spacer and rinse mouth afterwards.   Cholecalciferol 50 MCG (2000 UT) CAPS Take 1 capsule by mouth daily. PATIENT USING OIL AND NOT CAPSULES   diltiazem (CARDIZEM CD) 300 MG 24 hr capsule TAKE 1 CAPSULE BY MOUTH EVERY DAY   fexofenadine (ALLEGRA ALLERGY) 180 MG tablet Take 1 tablet (180 mg total) by mouth daily.   flecainide (TAMBOCOR) 50 MG  tablet TAKE 1 TABLET BY MOUTH TWICE A DAY   fluticasone (FLONASE) 50 MCG/ACT nasal spray Place 1 spray into both nostrils daily.   hydrochlorothiazide (HYDRODIURIL) 25 MG tablet TAKE 1 TABLET (25 MG TOTAL) BY MOUTH DAILY.   ipratropium-albuterol (DUONEB) 0.5-2.5 (3) MG/3ML SOLN Take 3 mLs by nebulization every 6 (six) hours as needed.   KLOR-CON M10 10 MEQ tablet TAKE 2 TABLETS BY MOUTH DAILY   levothyroxine (SYNTHROID) 100 MCG tablet TAKE 1 TABLET BY MOUTH EVERY MORNING ON AN EMPTY STOMACH Orally Once a day 90 days   losartan (COZAAR) 100 MG tablet TAKE 1 TABLET BY MOUTH EVERY DAY   Lutein 10 MG TABS Take 10 mg by mouth daily.   montelukast (SINGULAIR) 10 MG tablet Take 1 tablet (10 mg total) by mouth at bedtime.   MULTIPLE VITAMIN PO Take 1 tablet by mouth daily.   pantoprazole (PROTONIX) 40 MG tablet Take 1 tablet (40 mg total) by mouth daily.   PSYLLIUM PO Take 1 packet by mouth daily.   Spacer/Aero-Holding CDorise BullionUse with spacer   No facility-administered encounter medications on file as of 09/10/2021.    Allergies (verified) Patient has no known allergies.   History: Past Medical History:  Diagnosis Date   Arthritis    no meds   Asthma    Edema  Fibroids    Hyperlipidemia    Hypertension    Obesity    RML pneumonia 02/25/2021   Sleep apnea    wears cpap    Thyroid nodule 01/2009   left discovered on chest ct; needle bx "indeterminant" cannon r/o follicular neoplasm    Past Surgical History:  Procedure Laterality Date   ABDOMINAL HYSTERECTOMY Bilateral 08/02/2013   Procedure: TOTAL ABDOMINAL HYSTERECTOMY WITH BILATERAL SALPINGO-OOPHORECTOMY ;  Surgeon: Terrance Mass, MD;  Location: [facility];  Service: Gynecology;  Laterality: Bilateral;    Dr. Phineas Real to assist.  Dr. Zella Richer to follow with an umbilical hernia repair. He will need an additional 1 1/2 hours.    COLONOSCOPY  12/27/2007   [facility]    HERNIA REPAIR     MOUTH SURGERY     thyroid needle bx      TONSILLECTOMY     UMBILICAL HERNIA REPAIR N/A 08/02/2013   Procedure: HERNIA REPAIR UMBILICAL ADULT WITH MESH;  Surgeon: Odis Hollingshead, MD;  Location: [facility];  Service: General;  Laterality: N/A;   Family History  Problem Relation Age of Onset   Arthritis Mother    Other Mother        neurological disorder   Pancreatic cancer Mother    Heart disease Father    Thyroid nodules Sister    Heart disease Brother        x2 1 brother deceased   Colon cancer Neg Hx    Colon polyps Neg Hx    Esophageal cancer Neg Hx    Rectal cancer Neg Hx    Stomach cancer Neg Hx    Social History   Socioeconomic History   Marital status: Married    Spouse name: Not on file   Number of children: 0   Years of education: Not on file   Highest education level: Associate degree: occupational, Hotel manager, or vocational program  Occupational History   Occupation: retired  Tobacco Use   Smoking status: Former    Packs/day: 1.00    Years: 1.00    Total pack years: 1.00    Types: Cigarettes    Quit date: 04/29/1971    Years since quitting: 50.4    Passive exposure: Never   Smokeless tobacco: Never  Vaping Use   Vaping Use: Never used  Substance and Sexual Activity   Alcohol use: Not Currently    Comment: occasionally   Drug use: No   Sexual activity: Not on file  Other Topics Concern   Not on file  Social History Narrative   Married   Occupation: Chiropractor currently unemployed   Married   Regular exercise-no   [place] of 2   Pet cat      Social Determinants of Health   Financial Resource Strain: Low Risk  (09/10/2021)   Overall Financial Resource Strain (CARDIA)    Difficulty of Paying Living Expenses: Not hard at all  Food Insecurity: No Food Insecurity (09/10/2021)   Hunger Vital Sign    Worried About Running Out of Food in the Last Year: Never true    [place] in the Last Year: Never true  Transportation Needs: No Transportation Needs (09/10/2021)   PRAPARE -  Hydrologist (Medical): No    Lack of Transportation (Non-Medical): No  Physical Activity: Sufficiently Active (09/10/2021)   Exercise Vital Sign    Days of Exercise per Week: 3 days    Minutes of Exercise per Session: 60 min  Stress: No Stress Concern Present (09/10/2021)   [place]    Feeling of Stress : Not at all  Social Connections: Unknown (05/06/2021)   Social Connection and Isolation Panel [NHANES]    Frequency of Communication with Friends and Family: Once a week    Frequency of Social Gatherings with Friends and Family: Once a week    Attends Religious Services: Patient refused    Marine scientist or Organizations: No    Attends Music therapist: Not on file    Marital Status: Married    Tobacco Counseling Counseling given: Not Answered   Clinical Intake:  Pre-visit preparation completed: Yes  Pain : No/denies pain     Nutritional Status: BMI > 30  Obese Nutritional Risks: None Diabetes: No  How often do you need to have someone help you when you read instructions, pamphlets, or other written materials from your doctor or pharmacy?: 1 - Never What is the last grade level you completed in school?: associates degree  Diabetic? no  Interpreter Needed?: No  Information entered by :: NAllen LPN   Activities of Daily Living    09/10/2021    8:30 AM 02/25/2021   11:00 PM  In your present state of health, do you have any difficulty performing the following activities:  Hearing? 0 0  Comment slight   Vision? 0 0  Difficulty concentrating or making decisions? 0 0  Walking or climbing stairs? 0 1  Dressing or bathing? 0 0  Doing errands, shopping? 0 0  Preparing Food and eating ? N   Using the Toilet? N   In the past six months, have you accidently leaked urine? Y   Do you have problems with loss of bowel control? N   Managing your Medications? N    Managing your Finances? N   Housekeeping or managing your Housekeeping? N     Patient Care Team: Panosh, [name], MD as PCP - General [name], MD as PCP - Cardiology (Cardiology) [name], MD as PCP - Electrophysiology (Cardiology) [name], MD as Consulting Physician (Endocrinology) [name], MD as Consulting Physician (Pulmonary Disease) [name], MD as Consulting Physician (Obstetrics and Gynecology) [name], [facility] as Pharmacist (Pharmacist)  Indicate any recent Medical Services you may have received from other than [facility] providers in the past year (date may be approximate).     Assessment:   This is a routine wellness examination for Brailee.  Hearing/Vision screen Vision Screening - Comments:: Regular eye exams, Dr. Herbert Deaner  Dietary issues and exercise activities discussed: Current Exercise Habits: Home exercise routine, Type of exercise: calisthenics;strength training/weights, Time (Minutes): 60   Goals Addressed             This Visit's Progress    Patient Stated       09/10/2021, stay fit, remain independent       Depression Screen    09/10/2021    8:30 AM 05/07/2021   11:01 AM 08/28/2020    8:16 AM 05/14/2020    3:10 PM 06/10/2019    9:20 AM 11/04/2017   10:25 AM 10/17/2016    8:40 AM  PHQ 2/9 Scores  PHQ - 2 Score 0 0 0 0 0 0 0  PHQ- 9 Score  0         Fall Risk    09/10/2021    8:30 AM 05/07/2021   11:01 AM 05/06/2021   10:59  AM 08/28/2020    8:19 AM 06/11/2020   10:05 AM  Fall Risk   Falls in the past year? 0 0 0 0 0  Number falls in past yr: 0   0 0  Injury with Fall? 0   0 0  Risk for fall due to : Medication side effect   Impaired vision   Follow up Falls evaluation completed;Education provided;Falls prevention discussed   Falls prevention discussed     FALL RISK PREVENTION PERTAINING TO THE HOME:  Any stairs in or around the home? Yes   If so, are there any without handrails? No  Home  free of loose throw rugs in walkways, pet beds, electrical cords, etc? Yes  Adequate lighting in your home to reduce risk of falls? Yes   ASSISTIVE DEVICES UTILIZED TO PREVENT FALLS:  Life alert? No  Use of a cane, walker or w/c? No  Grab bars in the bathroom? Yes  Shower chair or bench in shower? No  Elevated toilet seat or a handicapped toilet? No   TIMED UP AND GO:  Was the test performed? No .    Gait slow and steady without use of assistive device  Cognitive Function:        09/10/2021    8:32 AM 08/28/2020    8:21 AM  6CIT Screen  What Year? 0 points 0 points  What month? 0 points 0 points  What time? 0 points 0 points  Count back from 20 0 points 0 points  Months in reverse 0 points 0 points  Repeat phrase 0 points 2 points  Total Score 0 points 2 points    Immunizations Immunization History  Administered Date(s) Administered   Fluad Quad(high Dose 65+) 12/07/2019, 01/08/2021   Influenza Split 11/04/2012, 01/04/2014   Influenza, High Dose Seasonal PF 11/06/2016, 01/26/2018, 02/16/2019   Influenza,inj,Quad PF,6+ Mos 04/19/2015, 12/27/2015   Influenza-Unspecified 11/06/2016, 12/07/2019   PFIZER Comirnaty(Gray Top)Covid-19 Tri-Sucrose Vaccine 07/10/2020   PFIZER(Purple Top)SARS-COV-2 Vaccination 11/18/2018, 12/15/2018, 11/03/2019   Pfizer Covid-19 Vaccine Bivalent Booster 80yr & up 01/08/2021   Pneumococcal Conjugate-13 04/03/2016   Pneumococcal Polysaccharide-23 01/31/2010, 04/11/2020   Tdap 07/28/2013   Zoster Recombinat (Shingrix) 06/13/2017, 09/27/2017    TDAP status: Up to date  Flu Vaccine status: Up to date  Pneumococcal vaccine status: Up to date  Covid-19 vaccine status: Completed vaccines  Qualifies for Shingles Vaccine? Yes   Zostavax completed Yes   Shingrix Completed?: Yes  Screening Tests Health Maintenance  Topic Date Due   Hepatitis C Screening  Never done   PAP SMEAR-Modifier  11/04/2021 (Originally 09/29/2015)   INFLUENZA VACCINE   10/29/2021   MAMMOGRAM  01/23/2022   TETANUS/TDAP  07/29/2023   COLONOSCOPY (Pts 45-472yrInsurance coverage will need to be confirmed)  05/28/2028   Pneumonia Vaccine 6582Years old  Completed   DEXA SCAN  Completed   COVID-19 Vaccine  Completed   Zoster Vaccines- Shingrix  Completed   HPV VACCINES  Aged Out    Health Maintenance  Health Maintenance Due  Topic Date Due   Hepatitis C Screening  Never done    Colorectal cancer screening: Type of screening: Colonoscopy. Completed 05/28/2018. Repeat every 10 years  Mammogram status: Completed 01/23/2021. Repeat every year  Bone Density status: Completed 10/19/2014.   Lung Cancer Screening: (Low Dose CT Chest recommended if Age 71-80ears, 30 pack-year currently smoking OR have quit w/in 15years.) does not qualify.   Lung Cancer Screening Referral: no  Additional Screening:  Hepatitis  C Screening: does qualify;   Vision Screening: Recommended annual ophthalmology exams for early detection of glaucoma and other disorders of the eye. Is the patient up to date with their annual eye exam?  Yes  Who is the provider or what is the name of the office in which the patient attends annual eye exams? [facility] If pt is not established with a provider, would they like to be referred to a provider to establish care? No .   Dental Screening: Recommended annual dental exams for proper oral hygiene  Community Resource Referral / Chronic Care Management: CRR required this visit?  No   CCM required this visit?  No      Plan:     I have personally reviewed and noted the following in the patient's chart:   Medical and social history Use of alcohol, tobacco or illicit drugs  Current medications and supplements including opioid prescriptions.  Functional ability and status Nutritional status Physical activity Advanced directives List of other physicians Hospitalizations, surgeries, and ER visits in previous 12  months Vitals Screenings to include cognitive, depression, and falls Referrals and appointments  In addition, I have reviewed and discussed with patient certain preventive protocols, quality metrics, and best practice recommendations. A written personalized care plan for preventive services as well as general preventive health recommendations were provided to patient.     Kellie Simmering, LPN   06/21/4008   Nurse Notes: none

## 2021-09-10 NOTE — Telephone Encounter (Signed)
Pt states that APP wanted her to have Fasting Labs done. I do not see an order for any labs to be done. Pt would like for the order to be put in  being that she is close by the office and would like to go ahead and get them done. Pt would like a call back once the order is put in. Please advise

## 2021-09-11 LAB — COMPREHENSIVE METABOLIC PANEL
ALT: 13 IU/L (ref 0–32)
AST: 13 IU/L (ref 0–40)
Albumin/Globulin Ratio: 1.3 (ref 1.2–2.2)
Albumin: 3.7 g/dL — ABNORMAL LOW (ref 3.8–4.8)
Alkaline Phosphatase: 104 IU/L (ref 44–121)
BUN/Creatinine Ratio: 18 (ref 12–28)
BUN: 12 mg/dL (ref 8–27)
Bilirubin Total: 0.7 mg/dL (ref 0.0–1.2)
CO2: 30 mmol/L — ABNORMAL HIGH (ref 20–29)
Calcium: 9.1 mg/dL (ref 8.7–10.3)
Chloride: 98 mmol/L (ref 96–106)
Creatinine, Ser: 0.68 mg/dL (ref 0.57–1.00)
Globulin, Total: 2.8 g/dL (ref 1.5–4.5)
Glucose: 93 mg/dL (ref 70–99)
Potassium: 3.9 mmol/L (ref 3.5–5.2)
Sodium: 141 mmol/L (ref 134–144)
Total Protein: 6.5 g/dL (ref 6.0–8.5)
eGFR: 94 mL/min/{1.73_m2} (ref >59)

## 2021-09-11 LAB — LIPID PANEL
Chol/HDL Ratio: 3.9 ratio (ref 0.0–4.4)
Cholesterol, Total: 192 mg/dL (ref 100–199)
HDL: 49 mg/dL (ref >39)
LDL Chol Calc (NIH): 131 mg/dL — ABNORMAL HIGH (ref 0–99)
Triglycerides: 63 mg/dL (ref 0–149)
VLDL Cholesterol Cal: 12 mg/dL (ref 5–40)

## 2021-09-12 ENCOUNTER — Telehealth (HOSPITAL_BASED_OUTPATIENT_CLINIC_OR_DEPARTMENT_OTHER): Payer: Self-pay

## 2021-09-12 ENCOUNTER — Telehealth: Payer: Self-pay | Admitting: Nurse Practitioner

## 2021-09-12 ENCOUNTER — Ambulatory Visit: Payer: Medicare HMO | Admitting: Nurse Practitioner

## 2021-09-12 ENCOUNTER — Encounter: Payer: Self-pay | Admitting: Nurse Practitioner

## 2021-09-12 VITALS — BP 126/62 | HR 74 | Temp 97.9°F | Ht 68.0 in | Wt 285.8 lb

## 2021-09-12 DIAGNOSIS — R768 Other specified abnormal immunological findings in serum: Secondary | ICD-10-CM | POA: Diagnosis not present

## 2021-09-12 DIAGNOSIS — R059 Cough, unspecified: Secondary | ICD-10-CM | POA: Diagnosis not present

## 2021-09-12 DIAGNOSIS — J4540 Moderate persistent asthma, uncomplicated: Secondary | ICD-10-CM | POA: Diagnosis not present

## 2021-09-12 DIAGNOSIS — E782 Mixed hyperlipidemia: Secondary | ICD-10-CM

## 2021-09-12 DIAGNOSIS — G4733 Obstructive sleep apnea (adult) (pediatric): Secondary | ICD-10-CM

## 2021-09-12 LAB — POCT EXHALED NITRIC OXIDE: FeNO level (ppb): 22

## 2021-09-12 MED ORDER — ROSUVASTATIN CALCIUM 10 MG PO TABS
10.0000 mg | ORAL_TABLET | Freq: Every day | ORAL | 3 refills | Status: DC
Start: 2021-09-12 — End: 2022-07-31

## 2021-09-12 NOTE — Assessment & Plan Note (Addendum)
Question whether her cough is related to her asthma vs upper airway irritation from allergic rhinitis. FeNOs have been normal thus far. She did have some mild bronchospasm on exam today but this cleared with albuterol. Will hold off on oral steroids. She has had good response to [place] and changing to a more consistent twice daily regimen so we will continue this. Continue singulair and allegra for trigger prevention.   Patient Instructions  Continue Breztri 2 puffs Twice daily. Brush tongue and rinse mouth afterwards  -Continue Albuterol inhaler 2 puffs or duoneb 3 mL neb every 6 hours as needed for shortness of breath or wheezing. Notify if symptoms persist despite rescue inhaler/neb use. -Continue Allegra 180 mg tab in AM for allergies. -Continue Flonase nasal spray to 2 sprays each nostril daily  -Continue protonix 40 mg daily  -Continue singulair 10 mg daily    Continue to use CPAP every night, minimum of 4-6 hours a night.   Follow up in 3 months with Dr. Ander Slade or Alanson Aly. If symptoms do not improve or worsen, please contact office for sooner follow up or seek emergency care.

## 2021-09-12 NOTE — Patient Instructions (Addendum)
Continue Breztri 2 puffs Twice daily. Brush tongue and rinse mouth afterwards  -Continue Albuterol inhaler 2 puffs or duoneb 3 mL neb every 6 hours as needed for shortness of breath or wheezing. Notify if symptoms persist despite rescue inhaler/neb use. -Continue Allegra 180 mg tab in AM for allergies. -Continue Flonase nasal spray to 2 sprays each nostril daily  -Continue protonix 40 mg daily  -Continue singulair 10 mg daily    Continue to use CPAP every night, minimum of 4-6 hours a night.   Delsyn 2 tsp Twice daily for cough Chlorpheniramine tab 4 mg over the counter At bedtime for cough  Avoid throat clearing. Frequent sips of water. Suck on sugar free hard candies throughout the day.   Follow up in 3 months with Dr. Ander Slade or Alanson Aly. If symptoms do not improve or worsen, please contact office for sooner follow up or seek emergency care.

## 2021-09-12 NOTE — Progress Notes (Signed)
$'@Patient'i$  ID: Amy Stokes, female    DOB: 12/17/1950, 71 y.o.   MRN: [id number]  Chief Complaint  Patient presents with   Follow-up    Follow up. Patient says she has a cough and it's been persistent.     Referring provider: Burnis Medin, MD  HPI: 71 year old female, former remote smoker followed for asthma and OSA. She is a patient of Amy. Judson Stokes and last seen in office 07/05/2021 by Amy Evers,NP. Past medical history significant for obesity, HTN, chronic A fib on Eliquis, hypothyroidism with thyroid goiter, HLD.   TEST/EVENTS:  02/13/2014 HST: AHI 16.7, SpO2 low 70% 11/01/2020 echocardiogram: EF 17-61%, LV diastolic parameters were indeterminate. Previous echo with GIIDD. RV function nl but mildly enlarged. Unable to measure PASP.  07/23/2021 CPAP titration: optimal pressure 12 cmH2O   06/04/2021: OV with Amy Kesling NP. Worsening cough and wheezing over the past month. CBC with elevated eosinophils at 0.3 and IgE elevated to 1409. Started on singulair and short prednisone burst. Referred to allergy for further workup. Initiated therapies for postnasal drainage and cough control with mucinex, delsym, chlortab, and flonase. Reported some difficulties with increased SOB at night and drops in her oxygen when she would get up to use the bathroom despite consistent use of her CPAP. CPAP titration was ordered for further evaluation for oxygen requirement. Also reported some recent increase in BLE edema recently which cardiology had started her on lasix for 4-5 days. Appeared compensated on exam. CXR without acute process; chronic linear opacities b/l unchanged and consistent with scarring.   07/05/2021: OV with Amy Wentz NP for follow up. She reports that she was feeling better and her cough had improved up until this past week. She felt like her allergies slightly worsened and that her cough increased in frequency. Remained non-productive. She also was noted to be 84% on room air upon arrival to exam room;  quickly recovered to 94% with rest. She denied any recent shortness of breath but has noticed some occasional wheezing. She has still noticed that her oxygen levels have dropped into the 80's at night on her fit bit monitor, despite CPAP therapy. Plans for CPAP titration on 4/25. She reported that the swelling in her legs has improved as well, but she does notice that this is occasionally increased in the evenings. She has plans to follow up with cardiology. She continues on Advair HFA Twice daily, flonase, singulair, and over the counter allergy medicine. FeNO was nl and no evidence of bronchospasm on exam. Cough triggered by allergies. Added allegra for allergies - appt scheduled for May with allergist due to hyper IgE and persistent allergy symptoms.   09/12/2021: Today - follow up Patient presents today for follow up. Since I saw her last, she underwent CPAP titration study and was seen by allergist. CPAP titration study was read by Amy Stokes who recommended auto CPAP 4-20 cmH2O and sent orders for new machine, which the patient has received. She has felt like she sleeps well on this and has not had any further oxygen drops that she's noticed over night. Lowest SpO2 reading she has gotten has been 88%, but her average report in the morning from her watch is in the 90's. She feels like she has not been as short of breath at night with the new machine either. She was then seen by Amy Stokes with allergy, whom she was referred to for elevated IgE, and underwent allergy testing with positives to mold, cockroach and dust mites.  They stepped up her inhaler to [facility] and continued her on allegra and singulair. Discussed possible biologic therapy moving forward.   Today, she reports feeling relatively the same recently. She did notice that she was using her inhaler around 530 in the morning and then not doing her second dose until around 9/10 pm at night. She tried changing this yesterday and did 530 am and 6 pm. She  felt like this helped her breathing a lot and she wasn't as short of breath at the end of the day. She does still have an occasional cough, which is usually dry but she will occasionally get up some clear or white plegm, and some wheezing. Feels like allergies are better with Allegra. She denies hemoptysis, weight loss, fevers, night sweats, leg swelling.   FeNO 22 ppb   No Known Allergies  Immunization History  Administered Date(s) Administered   Fluad Quad(high Dose 65+) 12/07/2019, 01/08/2021   Influenza Split 11/04/2012, 01/04/2014   Influenza, High Dose Seasonal PF 11/06/2016, 01/26/2018, 02/16/2019   Influenza,inj,Quad PF,6+ Mos 04/19/2015, 12/27/2015   Influenza-Unspecified 11/06/2016, 12/07/2019   PFIZER Comirnaty(Gray Top)Covid-19 Tri-Sucrose Vaccine 07/10/2020   PFIZER(Purple Top)SARS-COV-2 Vaccination 11/18/2018, 12/15/2018, 11/03/2019   Pfizer Covid-19 Vaccine Bivalent Booster 72yr & up 01/08/2021   Pneumococcal Conjugate-13 04/03/2016   Pneumococcal Polysaccharide-23 01/31/2010, 04/11/2020   Tdap 07/28/2013   Zoster Recombinat (Shingrix) 06/13/2017, 09/27/2017    Past Medical History:  Diagnosis Date   Arthritis    no meds   Asthma    Edema    Fibroids    Hyperlipidemia    Hypertension    Obesity    RML pneumonia 02/25/2021   Sleep apnea    wears cpap    Thyroid nodule 01/2009   left discovered on chest ct; needle bx "indeterminant" cannon r/o follicular neoplasm     Tobacco History: Social History   Tobacco Use  Smoking Status Former   Packs/day: 1.00   Years: 1.00   Total pack years: 1.00   Types: Cigarettes   Quit date: 04/29/1971   Years since quitting: 50.4   Passive exposure: Never  Smokeless Tobacco Never   Counseling given: Not Answered   Outpatient Medications Prior to Visit  Medication Sig Dispense Refill   acetaminophen (TYLENOL) 650 MG CR tablet Take 1,300 mg by mouth every 8 (eight) hours as needed for pain.     apixaban (ELIQUIS) 5  MG TABS tablet Take 1 tablet (5 mg total) by mouth 2 (two) times daily. 180 tablet 2   Budeson-Glycopyrrol-Formoterol (BREZTRI AEROSPHERE) 160-9-4.8 MCG/ACT AERO Inhale 2 puffs into the lungs in the morning and at bedtime. with spacer and rinse mouth afterwards. 10.7 g 3   Cholecalciferol 50 MCG (2000 UT) CAPS Take 1 capsule by mouth daily. PATIENT USING OIL AND NOT CAPSULES     diltiazem (CARDIZEM CD) 300 MG 24 hr capsule TAKE 1 CAPSULE BY MOUTH EVERY DAY 90 capsule 3   fexofenadine (ALLEGRA ALLERGY) 180 MG tablet Take 1 tablet (180 mg total) by mouth daily. 30 tablet 5   flecainide (TAMBOCOR) 50 MG tablet TAKE 1 TABLET BY MOUTH TWICE A DAY 60 tablet 8   fluticasone (FLONASE) 50 MCG/ACT nasal spray Place 1 spray into both nostrils daily. 18.2 mL 2   hydrochlorothiazide (HYDRODIURIL) 25 MG tablet TAKE 1 TABLET (25 MG TOTAL) BY MOUTH DAILY. 90 tablet 0   ipratropium-albuterol (DUONEB) 0.5-2.5 (3) MG/3ML SOLN Take 3 mLs by nebulization every 6 (six) hours as needed. 360 mL 0  KLOR-CON M10 10 MEQ tablet TAKE 2 TABLETS BY MOUTH DAILY 180 tablet 0   levothyroxine (SYNTHROID) 100 MCG tablet TAKE 1 TABLET BY MOUTH EVERY MORNING ON AN EMPTY STOMACH Orally Once a day 90 days 90 tablet 4   losartan (COZAAR) 100 MG tablet TAKE 1 TABLET BY MOUTH EVERY DAY 90 tablet 1   Lutein 10 MG TABS Take 10 mg by mouth daily.     montelukast (SINGULAIR) 10 MG tablet Take 1 tablet (10 mg total) by mouth at bedtime. 30 tablet 5   MULTIPLE VITAMIN PO Take 1 tablet by mouth daily.     pantoprazole (PROTONIX) 40 MG tablet Take 1 tablet (40 mg total) by mouth daily. 90 tablet 1   PSYLLIUM PO Take 1 packet by mouth daily.     Spacer/Aero-Holding Dorise Bullion Use with spacer 1 each 2   albuterol (PROAIR HFA) 108 (90 Base) MCG/ACT inhaler Inhale 2 puffs into the lungs every 6 (six) hours as needed for wheezing. 1 each 2   No facility-administered medications prior to visit.     Review of Systems:   Constitutional: No  weight loss or gain, night sweats, fevers, chills, fatigue, or lassitude. HEENT: No headaches, difficulty swallowing, tooth/dental problems, or sore throat. No itching, ear ache. +occasional sneezing, nasal congestion (improved with allegra) CV:  PND improved with new CPAP. No chest pain, swelling in lower extremities orthopnea, anasarca, dizziness, palpitations, syncope Resp: +primarily non-productive cough; occasional wheeze; shortness of breath with exertion (baseline). No excess mucus or change in color of mucus. No hemoptysis. No chest wall deformity GI:  No heartburn, indigestion, abdominal pain, nausea, vomiting, diarrhea, change in bowel habits, loss of appetite, bloody stools.  Skin: No rash, lesions, ulcerations Neuro: No dizziness or lightheadedness.  Psych: No depression or anxiety. Mood stable.     Physical Exam:  BP 126/62 (BP Location: Right Arm, Patient Position: Sitting, Cuff Size: Large)   Pulse 74   Temp 97.9 F (36.6 C) (Oral)   Ht '5\' 8"'$  (1.727 m)   Wt 285 lb 12.8 oz (129.6 kg)   SpO2 94%   BMI 43.46 kg/m   GEN: Pleasant, interactive, well-appearing; obese; in no acute distress. HEENT:  Normocephalic and atraumatic. EACs patent bilaterally. TM pearly gray with present light reflex bilaterally. PERRLA. Sclera white. Nasal turbinates erythematous, moist and patent bilaterally. No rhinorrhea present. Oropharynx erythematous and moist, without exudate or edema. No lesions, ulcerations  NECK:  Supple w/ fair ROM. No JVD present. Normal carotid impulses w/o bruits. Thyroid symmetrical with no goiter or nodules palpated. No lymphadenopathy.   CV: RRR, no m/r/g, no peripheral edema. Pulses intact, +2 bilaterally. No cyanosis, pallor or clubbing. PULMONARY:  Unlabored, regular breathing. Minimal end expiratory wheeze bilaterally A&P; cleared with albuterol. No accessory muscle use. No dullness to percussion. GI: BS present and normoactive. Soft, non-tender to palpation. No  organomegaly or masses detected. No CVA tenderness. MSK: No erythema, warmth or tenderness. Cap refil <2 sec all extrem. No deformities or joint swelling noted.  Neuro: A/Ox3. No focal deficits noted.   Skin: Warm, no lesions or rashe Psych: Normal affect and behavior. Judgement and thought content appropriate.     Lab Results:  CBC    Component Value Date/Time   WBC 6.6 06/04/2021 1055   RBC 4.45 06/04/2021 1055   HGB 12.6 06/04/2021 1055   HGB 12.8 01/10/2021 1016   HCT 38.2 06/04/2021 1055   HCT 39.7 01/10/2021 1016   PLT 251.0 06/04/2021 1055  PLT 266 01/10/2021 1016   MCV 86.0 06/04/2021 1055   MCV 84 01/10/2021 1016   MCH 28.1 02/26/2021 0421   MCHC 32.8 06/04/2021 1055   RDW 14.9 06/04/2021 1055   RDW 13.3 01/10/2021 1016   LYMPHSABS 1.1 06/04/2021 1055   MONOABS 0.4 06/04/2021 1055   EOSABS 0.3 06/04/2021 1055   BASOSABS 0.0 06/04/2021 1055    BMET    Component Value Date/Time   NA 141 09/10/2021 0936   K 3.9 09/10/2021 0936   CL 98 09/10/2021 0936   CO2 30 (H) 09/10/2021 0936   GLUCOSE 93 09/10/2021 0936   GLUCOSE 150 (H) 02/28/2021 0408   BUN 12 09/10/2021 0936   CREATININE 0.68 09/10/2021 0936   CALCIUM 9.1 09/10/2021 0936   GFRNONAA >60 02/28/2021 0408   GFRAA 95 05/10/2008 0944    BNP No results found for: "BNP"   Imaging:  No results found.        No data to display          Lab Results  Component Value Date   NITRICOXIDE 17 06/04/2021        Assessment & Plan:   Not well controlled moderate persistent asthma Question whether her cough is related to her asthma vs upper airway irritation from allergic rhinitis. FeNOs have been normal thus far. She did have some mild bronchospasm on exam today but this cleared with albuterol. Will hold off on oral steroids. She has had good response to [place] and changing to a more consistent twice daily regimen so we will continue this. Continue singulair and allegra for trigger prevention.    Patient Instructions  Continue Breztri 2 puffs Twice daily. Brush tongue and rinse mouth afterwards  -Continue Albuterol inhaler 2 puffs or duoneb 3 mL neb every 6 hours as needed for shortness of breath or wheezing. Notify if symptoms persist despite rescue inhaler/neb use. -Continue Allegra 180 mg tab in AM for allergies. -Continue Flonase nasal spray to 2 sprays each nostril daily  -Continue protonix 40 mg daily  -Continue singulair 10 mg daily    Continue to use CPAP every night, minimum of 4-6 hours a night.   Follow up in 3 months with Amy. Ander Slade or Alanson Aly. If symptoms do not improve or worsen, please contact office for sooner follow up or seek emergency care.    OSA (obstructive sleep apnea) Oxygen readings have improved with new CPAP. Download showed excellent compliance with residual AHI of 1.7 and no significant leaks. Continues to receive good benefit. Will continue auto 4-20 cmH2O. Notify of O2 drops 88% or below.   Upper airway cough syndrome    Elevated IgE level Referred to allergist previously. They stepped her up to [facility] and discussed possible biologic therapy in the future. She did have some improvement in allergy symptoms after I started her on Allegra during our last visit. Positive allergy testing to multiple environmental triggers. Follow up as scheduled.   Cough Chronic cough. Possibly UACS from upper airway irritation due to postnasal drainage from allergic rhinitis. FeNOs have been normal recently. Currently not bothering her too much. Continue cough control regimen.    I spent 35 minutes of dedicated to the care of this patient on the date of this encounter to include pre-visit review of records, face-to-face time with the patient discussing conditions above, post visit ordering of testing, clinical documentation with the electronic health record, making appropriate referrals as documented, and communicating necessary findings to members of the  patients  care team.  Clayton Bibles, NP 09/12/2021  Pt aware and understands NP's role.

## 2021-09-12 NOTE — Assessment & Plan Note (Signed)
Chronic cough. Possibly UACS from upper airway irritation due to postnasal drainage from allergic rhinitis. FeNOs have been normal recently. Currently not bothering her too much. Continue cough control regimen.

## 2021-09-12 NOTE — Telephone Encounter (Addendum)
Results called to patient who verbalizes understanding! Labs ordered and mailed, prescriptions updated and sent to pharmacy on file      ----- Message from Loel Dubonnet, NP sent at 09/11/2021  8:29 AM EDT ----- LDL of 131 which is above goal of less than 100. Normal kidney and liver function. Normal electrolytes. Albumin mildly low, not of concern. 10-year ASCVD risk score of 11.6% based on these numbers. Recommend addition of Rosuvastatin '10mg'$  daily with repeat FLP/LFT in 8 weeks.

## 2021-09-12 NOTE — Assessment & Plan Note (Signed)
Oxygen readings have improved with new CPAP. Download showed excellent compliance with residual AHI of 1.7 and no significant leaks. Continues to receive good benefit. Will continue auto 4-20 cmH2O. Notify of O2 drops 88% or below.

## 2021-09-12 NOTE — Assessment & Plan Note (Addendum)
Referred to allergist previously. They stepped her up to [facility] and discussed possible biologic therapy in the future. She did have some improvement in allergy symptoms after I started her on Allegra during our last visit. Positive allergy testing to multiple environmental triggers. Follow up as scheduled.

## 2021-09-13 NOTE — Telephone Encounter (Signed)
Called and spoke with pt who stated that the albuterol inhaler has helped with her wheezing. Stated to pt to use inhaler as directed and she verbalized understanding. Nothing further needed.

## 2021-09-16 ENCOUNTER — Telehealth: Payer: Self-pay | Admitting: Nurse Practitioner

## 2021-09-17 ENCOUNTER — Other Ambulatory Visit: Payer: Self-pay | Admitting: Endocrinology

## 2021-09-17 DIAGNOSIS — E049 Nontoxic goiter, unspecified: Secondary | ICD-10-CM

## 2021-09-17 NOTE — Telephone Encounter (Signed)
Called patient this morning to verify that she wanted [place] to send last office note to [place]. Patient confirmed that the DME she uses is [facility]. Last OV note sent. Nothing further needed

## 2021-09-19 ENCOUNTER — Telehealth: Payer: Self-pay | Admitting: Cardiology

## 2021-09-19 ENCOUNTER — Ambulatory Visit
Admission: RE | Admit: 2021-09-19 | Discharge: 2021-09-19 | Disposition: A | Discharge status: Home / Self Care | Payer: Medicare HMO | Source: Ambulatory Visit | Attending: Endocrinology | Admitting: Endocrinology

## 2021-09-19 DIAGNOSIS — E049 Nontoxic goiter, unspecified: Secondary | ICD-10-CM | POA: Diagnosis not present

## 2021-09-19 NOTE — Telephone Encounter (Signed)
Called to schedule sleep compliance appt, patient stated she had her f/u with her Pulmonology office. She saw Roxan Diesel, NP. Just Juluis Rainier

## 2021-09-20 NOTE — Telephone Encounter (Signed)
Received fax from [facility] stating pt is approved for patient assistance from 09/19/21-03/30/22.  Pt made aware

## 2021-10-02 DIAGNOSIS — G4733 Obstructive sleep apnea (adult) (pediatric): Secondary | ICD-10-CM | POA: Diagnosis not present

## 2021-10-02 DIAGNOSIS — Z9989 Dependence on other enabling machines and devices: Secondary | ICD-10-CM | POA: Diagnosis not present

## 2021-10-03 DIAGNOSIS — Z0100 Encounter for examination of eyes and vision without abnormal findings: Secondary | ICD-10-CM | POA: Diagnosis not present

## 2021-10-17 ENCOUNTER — Other Ambulatory Visit: Payer: Self-pay | Admitting: Internal Medicine

## 2021-10-20 ENCOUNTER — Other Ambulatory Visit: Payer: Self-pay | Admitting: Internal Medicine

## 2021-10-21 DIAGNOSIS — I10 Essential (primary) hypertension: Secondary | ICD-10-CM | POA: Diagnosis not present

## 2021-10-21 DIAGNOSIS — E02 Subclinical iodine-deficiency hypothyroidism: Secondary | ICD-10-CM | POA: Diagnosis not present

## 2021-10-30 ENCOUNTER — Telehealth: Payer: Self-pay | Admitting: Pharmacist

## 2021-10-30 NOTE — Chronic Care Management (AMB) (Signed)
Chronic Care Management Pharmacy Assistant   Name: Amy Stokes  MRN: [id number] DOB: Jan 05, 1951  Reason for Encounter: Disease State / Hypertension Assessment Call   Conditions to be addressed/monitored: HTN  Recent office visits:  09/10/2021 Amy Durand LPN - Medicare annual wellness exam.   Recent consult visits:  10/21/2021 Amy Pi MD - [place]. Patient was seen for essential hypertension an subclinical iodine-deficiency hypothyroidism. No other chart notes.   09/12/2021 Amy Cobb NP(pulmonary) - Patient was seen for cough and additional issues. Discontinued Albuterol HFA. Follow up in 3 months.   08/28/2021 Amy Montana NP (cardiology) - Patient was seen for PAF (paroxysmal atrial fibrillation) and additional issues. No medication changes. Follow up in 6 months.   Hospital visits:  None  Medications: Outpatient Encounter Medications as of 10/30/2021  Medication Sig   acetaminophen (TYLENOL) 650 MG CR tablet Take 1,300 mg by mouth every 8 (eight) hours as needed for pain.   apixaban (ELIQUIS) 5 MG TABS tablet Take 1 tablet (5 mg total) by mouth 2 (two) times daily.   Budeson-Glycopyrrol-Formoterol (BREZTRI AEROSPHERE) 160-9-4.8 MCG/ACT AERO Inhale 2 puffs into the lungs in the morning and at bedtime. with spacer and rinse mouth afterwards.   Cholecalciferol 50 MCG (2000 UT) CAPS Take 1 capsule by mouth daily. PATIENT USING OIL AND NOT CAPSULES   diltiazem (CARDIZEM CD) 300 MG 24 hr capsule TAKE 1 CAPSULE BY MOUTH EVERY DAY   fexofenadine (ALLEGRA ALLERGY) 180 MG tablet Take 1 tablet (180 mg total) by mouth daily.   flecainide (TAMBOCOR) 50 MG tablet TAKE 1 TABLET BY MOUTH TWICE A DAY   fluticasone (FLONASE) 50 MCG/ACT nasal spray Place 1 spray into both nostrils daily.   hydrochlorothiazide (HYDRODIURIL) 25 MG tablet TAKE 1 TABLET (25 MG TOTAL) BY MOUTH DAILY.   ipratropium-albuterol (DUONEB) 0.5-2.5 (3) MG/3ML SOLN Take 3 mLs by  nebulization every 6 (six) hours as needed.   KLOR-CON M10 10 MEQ tablet TAKE 2 TABLETS BY MOUTH DAILY   levothyroxine (SYNTHROID) 100 MCG tablet TAKE 1 TABLET BY MOUTH EVERY MORNING ON AN EMPTY STOMACH Orally Once a day 90 days   losartan (COZAAR) 100 MG tablet TAKE 1 TABLET BY MOUTH EVERY DAY   Lutein 10 MG TABS Take 10 mg by mouth daily.   montelukast (SINGULAIR) 10 MG tablet Take 1 tablet (10 mg total) by mouth at bedtime.   MULTIPLE VITAMIN PO Take 1 tablet by mouth daily.   pantoprazole (PROTONIX) 40 MG tablet Take 1 tablet (40 mg total) by mouth daily.   PSYLLIUM PO Take 1 packet by mouth daily.   rosuvastatin (CRESTOR) 10 MG tablet Take 1 tablet (10 mg total) by mouth daily.   Spacer/Aero-Holding Chambers DEVI Use with spacer   [DISCONTINUED] albuterol (PROAIR HFA) 108 (90 Base) MCG/ACT inhaler Inhale 2 puffs into the lungs every 6 (six) hours as needed for wheezing.   No facility-administered encounter medications on file as of 10/30/2021.  Fill History: BREZTRI AERO SPHERE INH 07/30/2021 30   DILTIAZEM 24H ER(CD) 300 MG CP 09/23/2021 90   FEXOFENADINE HCL 180 MG TABLET 09/25/2021 30   FLUTICASONE PROP 50 MCG SPRAY 06/04/2021 120   HYDROCHLOROT '25MG'$  TAB 10/21/2021 90   LEVOTHYROXINE 100 MCG TABLET 09/29/2021 90   LOSARTAN POTASSIUM 100 MG TAB 10/17/2021 90   MONTELUKAST SOD 10 MG TABLET 08/29/2021 90   PANTOPRAZOLE SOD DR 40 MG TAB 08/19/2021 90   ROSUVASTATIN CALCIUM 10 MG TAB 09/12/2021 90   Reviewed  chart prior to disease state call. Spoke with patient regarding BP  Recent Office Vitals: BP Readings from Last 3 Encounters:  09/12/21 126/62  09/10/21 122/60  08/28/21 138/70   Pulse Readings from Last 3 Encounters:  09/12/21 74  09/10/21 72  08/28/21 74    Wt Readings from Last 3 Encounters:  09/12/21 285 lb 12.8 oz (129.6 kg)  09/10/21 282 lb 1.6 oz (128 kg)  08/28/21 281 lb 12.8 oz (127.8 kg)     Kidney Function Lab Results  Component Value Date/Time    CREATININE 0.68 09/10/2021 09:36 AM   CREATININE 0.69 02/28/2021 04:08 AM   GFR 90.27 07/02/2020 08:05 AM   GFRNONAA >60 02/28/2021 04:08 AM   GFRAA 95 05/10/2008 09:44 AM       Latest Ref Rng & Units 09/10/2021    9:36 AM 02/28/2021    4:08 AM 02/27/2021    4:36 AM  BMP  Glucose 70 - 99 mg/dL 93  150  136   BUN 8 - 27 mg/dL '12  22  24   '$ Creatinine 0.57 - 1.00 mg/dL 0.68  0.69  0.64   BUN/Creat Ratio 12 - 28 18     Sodium 134 - 144 mmol/L 141  137  135   Potassium 3.5 - 5.2 mmol/L 3.9  4.9  3.7   Chloride 96 - 106 mmol/L 98  97  97   CO2 20 - 29 mmol/L 30  34  33   Calcium 8.7 - 10.3 mg/dL 9.1  8.7  8.6     Current antihypertensive regimen:  Diltiazem 300 mg daily HCTZ 25 mg daily Losartan 100 mg daily  How often are you checking your Blood Pressure? Patient states she is checking blood pressures daily.  Current home BP readings: Patient doesn't have any readings with her, she states they run between 124/58 and 142/62. She states she will send you a my chart message later with some of her current readings.   What recent interventions/DTPs have been made by any provider to improve Blood Pressure control since last CPP Visit: No recent interventions.  Any recent hospitalizations or ED visits since last visit with CPP? No recent hospital visits.   What diet changes have been made to improve Blood Pressure Control?  Patient follows no specific diet, lower sodium and lower sugar Breakfast - patient will have canadian bacon with eggs and english muffin or oatmeal / cereal and fruit Lunch - patient will have meat with cracker or a sandwich Dinner - patient will have a meat with mixed vegetables.  What exercise is being done to improve your Blood Pressure Control?  Patient is going to a fitness center 3 days per week  Adherence Review: Is the patient currently on ACE/ARB medication? Yes Does the patient have >5 day gap between last estimated fill dates? No  Care Gaps: AWV  - completed 09/10/21 Last BP: 126/62 on 09/12/2021 Hep C Screen - never done Flu - due Papsmear - postponed  Star Rating Drugs: Losartan 100 mg - last filled 10/17/2021 90 DS at [facility] Rosuvastatin 10 mg - last filled 09/12/2021 90 DS at [facility] Pharmacist Assistant [phone]

## 2021-10-31 ENCOUNTER — Encounter: Payer: Self-pay | Admitting: Internal Medicine

## 2021-11-02 DIAGNOSIS — G4733 Obstructive sleep apnea (adult) (pediatric): Secondary | ICD-10-CM | POA: Diagnosis not present

## 2021-11-02 DIAGNOSIS — Z9989 Dependence on other enabling machines and devices: Secondary | ICD-10-CM | POA: Diagnosis not present

## 2021-11-06 ENCOUNTER — Other Ambulatory Visit: Payer: Self-pay | Admitting: Nurse Practitioner

## 2021-11-06 ENCOUNTER — Other Ambulatory Visit: Payer: Self-pay | Admitting: Internal Medicine

## 2021-11-06 DIAGNOSIS — R058 Other specified cough: Secondary | ICD-10-CM

## 2021-11-07 DIAGNOSIS — E782 Mixed hyperlipidemia: Secondary | ICD-10-CM | POA: Diagnosis not present

## 2021-11-08 LAB — LIPID PANEL
Chol/HDL Ratio: 2.8 ratio (ref 0.0–4.4)
Cholesterol, Total: 134 mg/dL (ref 100–199)
HDL: 48 mg/dL (ref >39)
LDL Chol Calc (NIH): 76 mg/dL (ref 0–99)
Triglycerides: 42 mg/dL (ref 0–149)
VLDL Cholesterol Cal: 10 mg/dL (ref 5–40)

## 2021-11-08 LAB — HEPATIC FUNCTION PANEL
ALT: 12 IU/L (ref 0–32)
AST: 13 IU/L (ref 0–40)
Albumin: 3.9 g/dL (ref 3.9–4.9)
Alkaline Phosphatase: 103 IU/L (ref 44–121)
Bilirubin Total: 0.5 mg/dL (ref 0.0–1.2)
Bilirubin, Direct: 0.15 mg/dL (ref 0.00–0.40)
Total Protein: 6.4 g/dL (ref 6.0–8.5)

## 2021-11-11 DIAGNOSIS — E02 Subclinical iodine-deficiency hypothyroidism: Secondary | ICD-10-CM | POA: Diagnosis not present

## 2021-11-11 DIAGNOSIS — E049 Nontoxic goiter, unspecified: Secondary | ICD-10-CM | POA: Diagnosis not present

## 2021-11-11 DIAGNOSIS — R7301 Impaired fasting glucose: Secondary | ICD-10-CM | POA: Diagnosis not present

## 2021-11-11 DIAGNOSIS — I480 Paroxysmal atrial fibrillation: Secondary | ICD-10-CM | POA: Diagnosis not present

## 2021-11-11 DIAGNOSIS — I10 Essential (primary) hypertension: Secondary | ICD-10-CM | POA: Diagnosis not present

## 2021-11-11 DIAGNOSIS — E871 Hypo-osmolality and hyponatremia: Secondary | ICD-10-CM | POA: Diagnosis not present

## 2021-11-11 NOTE — Progress Notes (Unsigned)
No chief complaint on file.   HPI: Patient  Amy Stokes  71 y.o. comes in today for Preventive Health Care visit    Hctz and potasssium Cards Thyroid Resp Lipids Anticoag A fib  MNG  Health Maintenance  Topic Date Due   Hepatitis C Screening  Never done   PAP SMEAR-Modifier  09/29/2015   COVID-19 Vaccine (6 - Pfizer risk series) 03/05/2021   INFLUENZA VACCINE  10/29/2021   MAMMOGRAM  01/23/2022   TETANUS/TDAP  07/29/2023   COLONOSCOPY (Pts 45-43yr Insurance coverage will need to be confirmed)  05/28/2028   Pneumonia Vaccine 71 Years old  Completed   DEXA SCAN  Completed   Zoster Vaccines- Shingrix  Completed   HPV VACCINES  Aged Out   Health Maintenance Review LIFESTYLE:  Exercise:   Tobacco/ETS: Alcohol:  Sugar beverages: Sleep: Drug use: no HH of  Work:    ROS:  GEN/ HEENT: No fever, significant weight changes sweats headaches vision problems hearing changes, CV/ PULM; No chest pain shortness of breath cough, syncope,edema  change in exercise tolerance. GI /GU: No adominal pain, vomiting, change in bowel habits. No blood in the stool. No significant GU symptoms. SKIN/HEME: ,no acute skin rashes suspicious lesions or bleeding. No lymphadenopathy, nodules, masses.  NEURO/ PSYCH:  No neurologic signs such as weakness numbness. No depression anxiety. IMM/ Allergy: No unusual infections.  Allergy .   REST of 12 system review negative except as per HPI   Past Medical History:  Diagnosis Date   Arthritis    no meds   Asthma    Edema    Fibroids    Hyperlipidemia    Hypertension    Obesity    RML pneumonia 02/25/2021   Sleep apnea    wears cpap    Thyroid nodule 01/2009   left discovered on chest ct; needle bx "indeterminant" cannon r/o follicular neoplasm     Past Surgical History:  Procedure Laterality Date   ABDOMINAL HYSTERECTOMY Bilateral 08/02/2013   Procedure: TOTAL ABDOMINAL HYSTERECTOMY WITH BILATERAL SALPINGO-OOPHORECTOMY ;  Surgeon:  JTerrance Mass MD;  [facility];  Service: Gynecology;  Laterality: Bilateral;    Dr. FPhineas Realto assist.  Dr. RZella Richerto follow with an umbilical hernia repair. He will need an additional 1 1/2 hours.    [facility]   HERNIA REPAIR     MOUTH SURGERY     thyroid needle bx     TONSILLECTOMY     UMBILICAL HERNIA REPAIR N/A 08/02/2013   Procedure: HERNIA REPAIR UMBILICAL ADULT WITH MESH;  Surgeon: TOdis Hollingshead MD;  [facility];  Service: General;  Laterality: N/A;    Family History  Problem Relation Age of Onset   Arthritis Mother    Other Mother        neurological disorder   Pancreatic cancer Mother    Heart disease Father    Thyroid nodules Sister    Heart disease Brother        x2 1 brother deceased   Colon cancer Neg Hx    Colon polyps Neg Hx    Esophageal cancer Neg Hx    Rectal cancer Neg Hx    Stomach cancer Neg Hx     Social History   Socioeconomic History   Marital status: Married    Spouse name: Not on file   Number of children: 0   Years of education: Not on file   Highest education level:  Associate degree: occupational, technical, or vocational program  Occupational History   Occupation: retired  Tobacco Use   Smoking status: Former    Packs/day: 1.00    Years: 1.00    Total pack years: 1.00    Types: Cigarettes    Quit date: 04/29/1971    Years since quitting: 50.5    Passive exposure: Never   Smokeless tobacco: Never  Vaping Use   Vaping Use: Never used  Substance and Sexual Activity   Alcohol use: Not Currently    Comment: occasionally   Drug use: No   Sexual activity: Not on file  Other Topics Concern   Not on file  Social History Narrative   Married   Occupation: Chiropractor currently unemployed   Married   Regular exercise-no   [place] of 2   Pet cat      Social Determinants of Health   Financial Resource Strain: [place]  (09/10/2021)   Overall Financial Resource Strain (CARDIA)     Difficulty of Paying Living Expenses: Not hard at all  Food Insecurity: No Food Insecurity (09/10/2021)   Hunger Vital Sign    Worried About Running Out of Food in the Last Year: Never true    [place] in the Last Year: Never true  Transportation Needs: No Transportation Needs (09/10/2021)   PRAPARE - Hydrologist (Medical): No    Lack of Transportation (Non-Medical): No  Physical Activity: Sufficiently Active (09/10/2021)   Exercise Vital Sign    Days of Exercise per Week: 3 days    Minutes of Exercise per Session: 60 min  Stress: No Stress Concern Present (09/10/2021)   [place]    Feeling of Stress : Not at all  Social Connections: Unknown (05/06/2021)   Social Connection and Isolation Panel [NHANES]    Frequency of Communication with Friends and Family: Once a week    Frequency of Social Gatherings with Friends and Family: Once a week    Attends Religious Services: Patient refused    Marine scientist or Organizations: No    Attends Music therapist: Not on file    Marital Status: Married    Outpatient Medications Prior to Visit  Medication Sig Dispense Refill   acetaminophen (TYLENOL) 650 MG CR tablet Take 1,300 mg by mouth every 8 (eight) hours as needed for pain.     apixaban (ELIQUIS) 5 MG TABS tablet Take 1 tablet (5 mg total) by mouth 2 (two) times daily. 180 tablet 2   Budeson-Glycopyrrol-Formoterol (BREZTRI AEROSPHERE) 160-9-4.8 MCG/ACT AERO Inhale 2 puffs into the lungs in the morning and at bedtime. with spacer and rinse mouth afterwards. 10.7 g 3   Cholecalciferol 50 MCG (2000 UT) CAPS Take 1 capsule by mouth daily. PATIENT USING OIL AND NOT CAPSULES     diltiazem (CARDIZEM CD) 300 MG 24 hr capsule TAKE 1 CAPSULE BY MOUTH EVERY DAY 90 capsule 3   fexofenadine (ALLEGRA ALLERGY) 180 MG tablet Take 1 tablet (180 mg total) by mouth daily. 30 tablet 5    flecainide (TAMBOCOR) 50 MG tablet TAKE 1 TABLET BY MOUTH TWICE A DAY 60 tablet 8   fluticasone (FLONASE) 50 MCG/ACT nasal spray Place 1 spray into both nostrils daily. 18.2 mL 2   hydrochlorothiazide (HYDRODIURIL) 25 MG tablet TAKE 1 TABLET (25 MG TOTAL) BY MOUTH DAILY. 90 tablet 0   ipratropium-albuterol (DUONEB) 0.5-2.5 (3) MG/3ML SOLN Take 3  mLs by nebulization every 6 (six) hours as needed. 360 mL 0   KLOR-CON M10 10 MEQ tablet TAKE 2 TABLETS BY MOUTH DAILY 180 tablet 0   levothyroxine (SYNTHROID) 100 MCG tablet TAKE 1 TABLET BY MOUTH EVERY MORNING ON AN EMPTY STOMACH Orally Once a day 90 days 90 tablet 4   losartan (COZAAR) 100 MG tablet TAKE 1 TABLET BY MOUTH EVERY DAY 90 tablet 1   Lutein 10 MG TABS Take 10 mg by mouth daily.     montelukast (SINGULAIR) 10 MG tablet Take 1 tablet (10 mg total) by mouth at bedtime. 30 tablet 5   MULTIPLE VITAMIN PO Take 1 tablet by mouth daily.     pantoprazole (PROTONIX) 40 MG tablet TAKE 1 TABLET BY MOUTH EVERY DAY 90 tablet 1   PSYLLIUM PO Take 1 packet by mouth daily.     rosuvastatin (CRESTOR) 10 MG tablet Take 1 tablet (10 mg total) by mouth daily. 90 tablet 3   Spacer/Aero-Holding Chambers DEVI Use with spacer 1 each 2   No facility-administered medications prior to visit.     EXAM:  There were no vitals taken for this visit.  There is no height or weight on file to calculate BMI. Wt Readings from Last 3 Encounters:  09/12/21 285 lb 12.8 oz (129.6 kg)  09/10/21 282 lb 1.6 oz (128 kg)  08/28/21 281 lb 12.8 oz (127.8 kg)    Physical Exam: Vital signs reviewed PJK:DTOI is a well-developed well-nourished alert cooperative    who appearsr stated age in no acute distress.  HEENT: normocephalic atraumatic , Eyes: PERRL EOM's full, conjunctiva clear, Nares: paten,t no deformity discharge or tenderness., Ears: no deformity EAC's clear TMs with normal landmarks. Mouth: clear OP, no lesions, edema.  Moist mucous membranes. Dentition in adequate  repair. NECK: supple without masses, thyromegaly or bruits. CHEST/PULM:  Clear to auscultation and percussion breath sounds equal no wheeze , rales or rhonchi. No chest wall deformities or tenderness. Breast: normal by inspection . No dimpling, discharge, masses, tenderness or discharge . CV: PMI is nondisplaced, S1 S2 no gallops, murmurs, rubs. Peripheral pulses are full without delay.No JVD .  ABDOMEN: Bowel sounds normal nontender  No guard or rebound, no hepato splenomegal no CVA tenderness.  No hernia. Extremtities:  No clubbing cyanosis or edema, no acute joint swelling or redness no focal atrophy NEURO:  Oriented x3, cranial nerves 3-12 appear to be intact, no obvious focal weakness,gait within normal limits no abnormal reflexes or asymmetrical SKIN: No acute rashes normal turgor, color, no bruising or petechiae. PSYCH: Oriented, good eye contact, no obvious depression anxiety, cognition and judgment appear normal. LN: no cervical axillary inguinal adenopathy  Lab Results  Component Value Date   WBC 6.6 06/04/2021   HGB 12.6 06/04/2021   HCT 38.2 06/04/2021   PLT 251.0 06/04/2021   GLUCOSE 93 09/10/2021   CHOL 134 11/07/2021   TRIG 42 11/07/2021   HDL 48 11/07/2021   LDLDIRECT 112 (H) 01/10/2021   LDLCALC 76 11/07/2021   ALT 12 11/07/2021   AST 13 11/07/2021   NA 141 09/10/2021   K 3.9 09/10/2021   CL 98 09/10/2021   CREATININE 0.68 09/10/2021   BUN 12 09/10/2021   CO2 30 (H) 09/10/2021   TSH 2.00 10/12/2020   INR 0.98 08/02/2013   HGBA1C 5.7 (A) 10/24/2020    BP Readings from Last 3 Encounters:  09/12/21 126/62  09/10/21 122/60  08/28/21 138/70    Labplan  reviewed with  patient   ASSESSMENT AND PLAN:  Discussed the following assessment and plan:  No diagnosis found. No follow-ups on file.  Patient Care Team: Galen Malkowski, [name], MD as PCP - General [name], MD as PCP - Cardiology (Cardiology) [name], MD as PCP - Electrophysiology  (Cardiology) [name], MD as Consulting Physician (Endocrinology) [name], MD as Consulting Physician (Pulmonary Disease) [name], MD as Consulting Physician (Obstetrics and Gynecology) [name], [facility] [place] as Pharmacist (Pharmacist) There are no Patient Instructions on file for this visit.  [name]. Schwanda Zima M.D.

## 2021-11-12 ENCOUNTER — Ambulatory Visit (INDEPENDENT_AMBULATORY_CARE_PROVIDER_SITE_OTHER): Payer: Medicare HMO | Admitting: Internal Medicine

## 2021-11-12 ENCOUNTER — Encounter: Payer: Self-pay | Admitting: Internal Medicine

## 2021-11-12 VITALS — BP 138/70 | HR 66 | Temp 97.6°F | Ht 67.0 in | Wt 285.8 lb

## 2021-11-12 DIAGNOSIS — Z79899 Other long term (current) drug therapy: Secondary | ICD-10-CM | POA: Diagnosis not present

## 2021-11-12 DIAGNOSIS — R7301 Impaired fasting glucose: Secondary | ICD-10-CM

## 2021-11-12 DIAGNOSIS — E871 Hypo-osmolality and hyponatremia: Secondary | ICD-10-CM

## 2021-11-12 DIAGNOSIS — J45909 Unspecified asthma, uncomplicated: Secondary | ICD-10-CM

## 2021-11-12 DIAGNOSIS — I10 Essential (primary) hypertension: Secondary | ICD-10-CM

## 2021-11-12 DIAGNOSIS — Z0000 Encounter for general adult medical examination without abnormal findings: Secondary | ICD-10-CM | POA: Diagnosis not present

## 2021-11-12 LAB — CBC WITH DIFFERENTIAL/PLATELET
Basophils Absolute: 0 10*3/uL (ref 0.0–0.1)
Basophils Relative: 0.5 % (ref 0.0–3.0)
Eosinophils Absolute: 0.2 10*3/uL (ref 0.0–0.7)
Eosinophils Relative: 2.6 % (ref 0.0–5.0)
HCT: 38.9 % (ref 36.0–46.0)
Hemoglobin: 12.5 g/dL (ref 12.0–15.0)
Lymphocytes Relative: 17.4 % (ref 12.0–46.0)
Lymphs Abs: 1.2 10*3/uL (ref 0.7–4.0)
MCHC: 32.2 g/dL (ref 30.0–36.0)
MCV: 86.5 fl (ref 78.0–100.0)
Monocytes Absolute: 0.4 10*3/uL (ref 0.1–1.0)
Monocytes Relative: 5.7 % (ref 3.0–12.0)
Neutro Abs: 5.2 10*3/uL (ref 1.4–7.7)
Neutrophils Relative %: 73.8 % (ref 43.0–77.0)
Platelets: 253 10*3/uL (ref 150.0–400.0)
RBC: 4.5 Mil/uL (ref 3.87–5.11)
RDW: 14.6 % (ref 11.5–15.5)
WBC: 7 10*3/uL (ref 4.0–10.5)

## 2021-11-12 LAB — BASIC METABOLIC PANEL
BUN: 14 mg/dL (ref 6–23)
CO2: 37 mEq/L — ABNORMAL HIGH (ref 19–32)
Calcium: 9.3 mg/dL (ref 8.4–10.5)
Chloride: 97 mEq/L (ref 96–112)
Creatinine, Ser: 0.68 mg/dL (ref 0.40–1.20)
GFR: 88.12 mL/min (ref >60.00)
Glucose, Bld: 91 mg/dL (ref 70–99)
Potassium: 3.3 mEq/L — ABNORMAL LOW (ref 3.5–5.1)
Sodium: 138 mEq/L (ref 135–145)

## 2021-11-12 LAB — HEMOGLOBIN A1C: Hgb A1c MFr Bld: 6 % (ref 4.6–6.5)

## 2021-11-12 NOTE — Patient Instructions (Signed)
Good to see you today  Rechecking labs  for sodium level . Diuretic is usually the culprit with and even without  excess water intake .  Will make a decision after   these labs and work on Bp control .  Keep working on healthy weight loss .   Fu depending on labs  and bp

## 2021-11-15 ENCOUNTER — Other Ambulatory Visit: Payer: Self-pay

## 2021-11-15 ENCOUNTER — Encounter: Payer: Self-pay | Admitting: Internal Medicine

## 2021-11-15 DIAGNOSIS — E876 Hypokalemia: Secondary | ICD-10-CM

## 2021-11-15 NOTE — Progress Notes (Signed)
So sodium level is fine   but potassium is low!.      Increase  potassium to 3 x 10 meq per day ( send in new rx as needed disp 90 days)     Bmp in 2-3 weeks  dx low potassium

## 2021-11-15 NOTE — Telephone Encounter (Signed)
Spoke to patient. See lab note.  

## 2021-11-27 ENCOUNTER — Other Ambulatory Visit: Payer: Self-pay | Admitting: Nurse Practitioner

## 2021-11-27 DIAGNOSIS — J45901 Unspecified asthma with (acute) exacerbation: Secondary | ICD-10-CM

## 2021-11-29 ENCOUNTER — Other Ambulatory Visit (INDEPENDENT_AMBULATORY_CARE_PROVIDER_SITE_OTHER): Payer: Medicare HMO

## 2021-11-29 DIAGNOSIS — E876 Hypokalemia: Secondary | ICD-10-CM

## 2021-11-29 LAB — BASIC METABOLIC PANEL
BUN: 17 mg/dL (ref 6–23)
CO2: 35 mEq/L — ABNORMAL HIGH (ref 19–32)
Calcium: 9.1 mg/dL (ref 8.4–10.5)
Chloride: 97 mEq/L (ref 96–112)
Creatinine, Ser: 0.8 mg/dL (ref 0.40–1.20)
GFR: 74.52 mL/min (ref >60.00)
Glucose, Bld: 96 mg/dL (ref 70–99)
Potassium: 3.7 mEq/L (ref 3.5–5.1)
Sodium: 138 mEq/L (ref 135–145)

## 2021-12-03 DIAGNOSIS — Z9989 Dependence on other enabling machines and devices: Secondary | ICD-10-CM | POA: Diagnosis not present

## 2021-12-03 DIAGNOSIS — G4733 Obstructive sleep apnea (adult) (pediatric): Secondary | ICD-10-CM | POA: Diagnosis not present

## 2021-12-08 NOTE — Progress Notes (Signed)
Potassium is better  now in normal range

## 2021-12-11 ENCOUNTER — Telehealth: Payer: Self-pay | Admitting: Internal Medicine

## 2021-12-11 NOTE — Telephone Encounter (Signed)
Pt is called and would like maddie to return her call

## 2021-12-11 NOTE — Telephone Encounter (Signed)
Patient called about her Breztri and running low and wasn't sure if she should be getting refills anytime soon. Instructed patient to call [facility] to make sure her refills are getting processed.   Patient also inquired about Eliquis impacting her asthma. She reported she is thinking about enrolling in a study for a medication similar to Eliquis and it stated it could impact asthma so she wanted to make sure Eliquis was not doing that.

## 2021-12-17 ENCOUNTER — Telehealth: Payer: Self-pay | Admitting: Allergy

## 2021-12-17 MED ORDER — BREZTRI AEROSPHERE 160-9-4.8 MCG/ACT IN AERO: 2.0000 | INHALATION_SPRAY | Freq: Two times a day (BID) | RESPIRATORY_TRACT | 1 refills | Status: DC

## 2021-12-17 NOTE — Telephone Encounter (Signed)
Called and spoke to patient and informed her that she was overdue for her follow up appointment and we would send in the [facility] to [facility]. We also scheduled her an appointment for her follow up with Dr. Maudie Mercury. Patient would need another refill once she is seen in office.

## 2021-12-17 NOTE — Telephone Encounter (Signed)
Patient states her prescription for breztri has expired. Patient was advised that our office would need to fax a new prescription to [facility] Fax: [phone]  Best contact number for patient: [phone]

## 2021-12-19 DIAGNOSIS — H40013 Open angle with borderline findings, low risk, bilateral: Secondary | ICD-10-CM | POA: Diagnosis not present

## 2021-12-19 DIAGNOSIS — H524 Presbyopia: Secondary | ICD-10-CM | POA: Diagnosis not present

## 2021-12-19 DIAGNOSIS — H353131 Nonexudative age-related macular degeneration, bilateral, early dry stage: Secondary | ICD-10-CM | POA: Diagnosis not present

## 2021-12-24 ENCOUNTER — Other Ambulatory Visit: Payer: Self-pay | Admitting: Nurse Practitioner

## 2021-12-24 ENCOUNTER — Telehealth: Payer: Self-pay | Admitting: Nurse Practitioner

## 2021-12-24 DIAGNOSIS — J302 Other seasonal allergic rhinitis: Secondary | ICD-10-CM

## 2021-12-24 NOTE — Telephone Encounter (Signed)
Called and spoke with Dr Bettye Boeck, a research physician that recently seen pt on 12/10/2021 to discuss a research drug. He states that pt was actively wheezing and pt stated that O2 sats run in the high 80's to low 90's. He feels like she needs to be evaluated. Called and spoke with pt and scheduled appt with Dr Carmelina Dane since it has been over a year since he has seen her and her last 3 visits have been with Roxan Diesel, NP. PT given appt to see Dr Ander Slade 12/25/21.

## 2021-12-25 ENCOUNTER — Encounter: Payer: Self-pay | Admitting: Pulmonary Disease

## 2021-12-25 ENCOUNTER — Ambulatory Visit: Payer: Medicare HMO | Admitting: Pulmonary Disease

## 2021-12-25 VITALS — BP 118/68 | HR 72 | Temp 97.8°F | Ht 68.0 in | Wt 284.0 lb

## 2021-12-25 DIAGNOSIS — J4540 Moderate persistent asthma, uncomplicated: Secondary | ICD-10-CM | POA: Diagnosis not present

## 2021-12-25 DIAGNOSIS — R768 Other specified abnormal immunological findings in serum: Secondary | ICD-10-CM | POA: Diagnosis not present

## 2021-12-25 DIAGNOSIS — R0609 Other forms of dyspnea: Secondary | ICD-10-CM | POA: Diagnosis not present

## 2021-12-25 LAB — CBC WITH DIFFERENTIAL/PLATELET
Basophils Absolute: 0 10*3/uL (ref 0.0–0.1)
Basophils Relative: 0.4 % (ref 0.0–3.0)
Eosinophils Absolute: 0.2 10*3/uL (ref 0.0–0.7)
Eosinophils Relative: 2.9 % (ref 0.0–5.0)
HCT: 37.9 % (ref 36.0–46.0)
Hemoglobin: 12.5 g/dL (ref 12.0–15.0)
Lymphocytes Relative: 18.9 % (ref 12.0–46.0)
Lymphs Abs: 1.4 10*3/uL (ref 0.7–4.0)
MCHC: 33.1 g/dL (ref 30.0–36.0)
MCV: 86.1 fl (ref 78.0–100.0)
Monocytes Absolute: 0.6 10*3/uL (ref 0.1–1.0)
Monocytes Relative: 7.7 % (ref 3.0–12.0)
Neutro Abs: 5.4 10*3/uL (ref 1.4–7.7)
Neutrophils Relative %: 70.1 % (ref 43.0–77.0)
Platelets: 248 10*3/uL (ref 150.0–400.0)
RBC: 4.4 Mil/uL (ref 3.87–5.11)
RDW: 13.9 % (ref 11.5–15.5)
WBC: 7.7 10*3/uL (ref 4.0–10.5)

## 2021-12-25 MED ORDER — AZITHROMYCIN 250 MG PO TABS: 250.0000 mg | ORAL_TABLET | Freq: Every day | ORAL | 0 refills | Status: DC

## 2021-12-25 MED ORDER — PREDNISONE 20 MG PO TABS: 20.0000 mg | ORAL_TABLET | Freq: Every day | ORAL | 0 refills | Status: DC

## 2021-12-25 NOTE — Progress Notes (Signed)
Amy Stokes    [id number]    January 27, 1951  Primary Care Physician:Panosh, [name], MD  Referring Physician: Burnis Medin, [place],  [place] [address]  Chief complaint:   Known obstructive sleep apnea Stable symptoms Cough with reason  HPI:  Tolerating CPAP well, received a new machine, auto set between 4 and 20 Wakes up feeling like she is not a good nights rest  Persistent cough, wheezing, shortness of breath  Allergy testing reveals allergies to mold, dust mites, roach  On Breztri  Albuterol use about once or twice a week  Oxygen levels have been maintaining well  Memory is fine No dryness of her mouth Wakes up in the morning feeling like she is at a good nights rest on most days  Weight has been stable  Reformed smoker quit in 1973  Some concern regarding persistent cough and wheezing In the past that had IgE levels in the 1400s   Outpatient Encounter Medications as of 12/25/2021  Medication Sig   acetaminophen (TYLENOL) 650 MG CR tablet Take 1,300 mg by mouth every 8 (eight) hours as needed for pain.   apixaban (ELIQUIS) 5 MG TABS tablet Take 1 tablet (5 mg total) by mouth 2 (two) times daily.   Budeson-Glycopyrrol-Formoterol (BREZTRI AEROSPHERE) 160-9-4.8 MCG/ACT AERO Inhale 2 puffs into the lungs in the morning and at bedtime. with spacer and rinse mouth afterwards.   Cholecalciferol 50 MCG (2000 UT) CAPS Take 1 capsule by mouth daily. PATIENT USING OIL AND NOT CAPSULES   diltiazem (CARDIZEM CD) 300 MG 24 hr capsule TAKE 1 CAPSULE BY MOUTH EVERY DAY   fexofenadine (ALLEGRA) 180 MG tablet TAKE 1 TABLET BY MOUTH EVERY DAY   flecainide (TAMBOCOR) 50 MG tablet TAKE 1 TABLET BY MOUTH TWICE A DAY   fluticasone (FLONASE) 50 MCG/ACT nasal spray Place 1 spray into both nostrils daily.   hydrochlorothiazide (HYDRODIURIL) 25 MG tablet TAKE 1 TABLET (25 MG TOTAL) BY MOUTH DAILY.   ipratropium-albuterol (DUONEB) 0.5-2.5 (3) MG/3ML  SOLN Take 3 mLs by nebulization every 6 (six) hours as needed.   KLOR-CON M10 10 MEQ tablet TAKE 2 TABLETS BY MOUTH DAILY   levothyroxine (SYNTHROID) 100 MCG tablet TAKE 1 TABLET BY MOUTH EVERY MORNING ON AN EMPTY STOMACH Orally Once a day 90 days   losartan (COZAAR) 100 MG tablet TAKE 1 TABLET BY MOUTH EVERY DAY   Lutein 10 MG TABS Take 10 mg by mouth daily.   montelukast (SINGULAIR) 10 MG tablet TAKE 1 TABLET BY MOUTH EVERYDAY AT BEDTIME   MULTIPLE VITAMIN PO Take 1 tablet by mouth daily.   pantoprazole (PROTONIX) 40 MG tablet TAKE 1 TABLET BY MOUTH EVERY DAY   PSYLLIUM PO Take 1 packet by mouth daily.   rosuvastatin (CRESTOR) 10 MG tablet Take 1 tablet (10 mg total) by mouth daily.   Spacer/Aero-Holding Chambers DEVI Use with spacer   valACYclovir (VALTREX) 500 MG tablet As needed   [DISCONTINUED] albuterol (PROAIR HFA) 108 (90 Base) MCG/ACT inhaler Inhale 2 puffs into the lungs every 6 (six) hours as needed for wheezing.   No facility-administered encounter medications on file as of 12/25/2021.    Allergies as of 12/25/2021   (No Known Allergies)    Past Medical History:  Diagnosis Date   Arthritis    no meds   Asthma    Edema    Fibroids    Hyperlipidemia    Hypertension  Obesity    RML pneumonia 02/25/2021   Sleep apnea    wears cpap    Thyroid nodule 01/2009   left discovered on chest ct; needle bx "indeterminant" cannon r/o follicular neoplasm     Past Surgical History:  Procedure Laterality Date   ABDOMINAL HYSTERECTOMY Bilateral 08/02/2013   Procedure: TOTAL ABDOMINAL HYSTERECTOMY WITH BILATERAL SALPINGO-OOPHORECTOMY ;  Surgeon: Terrance Mass, MD;  Location: [facility];  Service: Gynecology;  Laterality: Bilateral;    Dr. Phineas Real to assist.  Dr. Zella Richer to follow with an umbilical hernia repair. He will need an additional 1 1/2 hours.    COLONOSCOPY  12/27/2007   [facility]    HERNIA REPAIR     MOUTH SURGERY     thyroid needle bx     TONSILLECTOMY      UMBILICAL HERNIA REPAIR N/A 08/02/2013   Procedure: HERNIA REPAIR UMBILICAL ADULT WITH MESH;  Surgeon: Odis Hollingshead, MD;  Location: [facility];  Service: General;  Laterality: N/A;    Family History  Problem Relation Age of Onset   Arthritis Mother    Other Mother        neurological disorder   Pancreatic cancer Mother    Heart disease Father    Thyroid nodules Sister    Heart disease Brother        x2 1 brother deceased   Colon cancer Neg Hx    Colon polyps Neg Hx    Esophageal cancer Neg Hx    Rectal cancer Neg Hx    Stomach cancer Neg Hx     Social History   Socioeconomic History   Marital status: Married    Spouse name: Not on file   Number of children: 0   Years of education: Not on file   Highest education level: Associate degree: occupational, Hotel manager, or vocational program  Occupational History   Occupation: retired  Tobacco Use   Smoking status: Former    Packs/day: 1.00    Years: 1.00    Total pack years: 1.00    Types: Cigarettes    Quit date: 04/29/1971    Years since quitting: 50.6    Passive exposure: Never   Smokeless tobacco: Never  Vaping Use   Vaping Use: Never used  Substance and Sexual Activity   Alcohol use: Not Currently    Comment: occasionally   Drug use: No   Sexual activity: Not on file  Other Topics Concern   Not on file  Social History Narrative   Married   Occupation: Chiropractor currently unemployed   Married   Regular exercise-no   [place] of 2   Pet cat      Social Determinants of Health   Financial Resource Strain: Low Risk  (09/10/2021)   Overall Financial Resource Strain (CARDIA)    Difficulty of Paying Living Expenses: Not hard at all  Food Insecurity: No Food Insecurity (09/10/2021)   Hunger Vital Sign    Worried About Running Out of Food in the Last Year: Never true    [place] in the Last Year: Never true  Transportation Needs: No Transportation Needs (09/10/2021)   PRAPARE - Radiographer, therapeutic (Medical): No    Lack of Transportation (Non-Medical): No  Physical Activity: Sufficiently Active (09/10/2021)   Exercise Vital Sign    Days of Exercise per Week: 3 days    Minutes of Exercise per Session: 60 min  Stress: No Stress Concern Present (09/10/2021)  [place]    Feeling of Stress : Not at all  Social Connections: Unknown (05/06/2021)   Social Connection and Isolation Panel [NHANES]    Frequency of Communication with Friends and Family: Once a week    Frequency of Social Gatherings with Friends and Family: Once a week    Attends Religious Services: Patient refused    Marine scientist or Organizations: No    Attends Music therapist: Not on file    Marital Status: Married  Human resources officer Violence: Not At Risk (08/28/2020)   Humiliation, Afraid, Rape, and Kick questionnaire    Fear of Current or Ex-Partner: No    Emotionally Abused: No    Physically Abused: No    Sexually Abused: No    Review of Systems  Constitutional:  Negative for fatigue.  HENT: Negative.    Eyes: Negative.   Respiratory: Negative.  Negative for shortness of breath.   Cardiovascular: Negative.  Negative for leg swelling.  Gastrointestinal: Negative.   Endocrine: Negative.   Genitourinary: Negative.     Vitals:   12/25/21 1029  BP: 118/68  Pulse: 72  Temp: 97.8 F (36.6 C)  SpO2: 93%     Physical Exam Constitutional:      Appearance: She is well-developed. She is obese.  HENT:     Head: Normocephalic and atraumatic.     Mouth/Throat:     Mouth: Mucous membranes are moist.  Eyes:     General:        Right eye: No discharge.        Left eye: No discharge.     Conjunctiva/sclera: Conjunctivae normal.  Neck:     Thyroid: No thyromegaly.     Trachea: No tracheal deviation.  Cardiovascular:     Rate and Rhythm: Normal rate and regular rhythm.  Pulmonary:     Effort: Pulmonary effort  is normal. No respiratory distress.     Breath sounds: No stridor. Wheezing present. No rhonchi.  Musculoskeletal:     Cervical back: No rigidity or tenderness.  Neurological:     Mental Status: She is alert.  Psychiatric:        Mood and Affect: Mood normal.      05/03/2018    2:00 PM  Results of the Epworth flowsheet  Sitting and reading 1  Watching TV 1  Sitting, inactive in a public place (e.g. a theatre or a meeting) 0  As a passenger in a car for an hour without a break 0  Lying down to rest in the afternoon when circumstances permit 0  Sitting and talking to someone 0  Sitting quietly after a lunch without alcohol 0  In a car, while stopped for a few minutes in traffic 0  Total score 2    Data Reviewed: Compliance data shows 100% compliance Average use of 8 hours 34 minutes Set between 4 and 20 Median pressure of 6.4 with 95 percentile pressure of 9.8  Residual AHI of 2.0  IgE in March 2023 was 1400  Assessment:  Obstructive sleep apnea -Stable symptoms -Compliant with CPAP use -Benefiting from CPAP use  Obesity  History of asthma -Continue Advair at present  With persistent cough and wheezing -Prescription for azithromycin and prednisone -Repeat CBC with differentials and IgE   Plan/Recommendations:  .  Continue CPAP therapy  .  CBC with differential and IgE .  Consideration for biologic if persistent symptoms that is uncontrolled with  inhalers  .  Follow-up in 4 weeks  .  Schedule patient for pulmonary function test   Sherrilyn Rist MD [facility] 12/25/2021, 10:33 AM  CC: Panosh, [name], MD

## 2021-12-25 NOTE — Patient Instructions (Signed)
Repeat CBC with differentials and IgE levels  Schedule for PFT  Prescription for azithromycin and prednisone sent into pharmacy for you  Follow-up in about 4 weeks  May consider biologic if still with significant cough, wheezing  Call with significant concerns  Continue using CPAP on a nightly basis

## 2021-12-26 LAB — IGE: IgE (Immunoglobulin E), Serum: 2005 kU/L — ABNORMAL HIGH (ref <=114)

## 2021-12-28 ENCOUNTER — Encounter (HOSPITAL_BASED_OUTPATIENT_CLINIC_OR_DEPARTMENT_OTHER): Payer: Self-pay | Admitting: Emergency Medicine

## 2021-12-28 ENCOUNTER — Emergency Department (HOSPITAL_BASED_OUTPATIENT_CLINIC_OR_DEPARTMENT_OTHER)
Admission: EM | Admit: 2021-12-28 | Discharge: 2021-12-28 | Disposition: A | Discharge status: Home / Self Care | Payer: Medicare HMO | Attending: Emergency Medicine | Admitting: Emergency Medicine

## 2021-12-28 DIAGNOSIS — Z23 Encounter for immunization: Secondary | ICD-10-CM | POA: Diagnosis not present

## 2021-12-28 DIAGNOSIS — S81811A Laceration without foreign body, right lower leg, initial encounter: Secondary | ICD-10-CM | POA: Insufficient documentation

## 2021-12-28 DIAGNOSIS — W232XXA Caught, crushed, jammed or pinched between a moving and stationary object, initial encounter: Secondary | ICD-10-CM | POA: Insufficient documentation

## 2021-12-28 DIAGNOSIS — Z7901 Long term (current) use of anticoagulants: Secondary | ICD-10-CM | POA: Diagnosis not present

## 2021-12-28 MED ORDER — LIDOCAINE-EPINEPHRINE (PF) 2 %-1:200000 IJ SOLN
INTRAMUSCULAR | Status: AC
  Filled 2021-12-28: qty 20

## 2021-12-28 MED ORDER — TETANUS-DIPHTH-ACELL PERTUSSIS 5-2.5-18.5 LF-MCG/0.5 IM SUSY
0.5000 mL | PREFILLED_SYRINGE | Freq: Once | INTRAMUSCULAR | Status: AC
  Administered 2021-12-28: 0.5 mL via INTRAMUSCULAR
  Filled 2021-12-28: qty 0.5

## 2021-12-28 MED ORDER — TETANUS-DIPHTH-ACELL PERTUSSIS 5-2.5-18.5 LF-MCG/0.5 IM SUSY: 0.5000 mL | PREFILLED_SYRINGE | Freq: Once | INTRAMUSCULAR | Status: DC

## 2021-12-28 MED ORDER — LIDOCAINE-EPINEPHRINE 2 %-1:100000 IJ SOLN: 20.0000 mL | Freq: Once | INTRAMUSCULAR | Status: DC

## 2021-12-28 NOTE — ED Triage Notes (Signed)
States she cut her R lower leg on the storm door today. She takes eliquis. Bandage in place.

## 2021-12-28 NOTE — ED Provider Triage Note (Signed)
Emergency Medicine Provider Triage Evaluation Note  Amy Stokes , a 71 y.o. female  was evaluated in triage.  Pt complains of injured RLE on storm door.. on eliquis  Review of Systems  Positive: Laceration  Negative: numbness  Physical Exam  BP (!) 141/68 (BP Location: Right Arm)   Pulse 71   Temp 98.6 F (37 C) (Oral)   Resp 18   Ht '5\' 8"'$  (1.727 m)   Wt 128.8 kg   SpO2 96%   BMI 43.18 kg/m  Gen:   Awake, no distress   Resp:  Normal effort  MSK:   Moves extremities without difficulty  Other:  Laceration rleg  Medical Decision Making  Medically screening exam initiated at 5:24 PM.  Appropriate orders placed.  ADYN HOES was informed that the remainder of the evaluation will be completed by another provider, this initial triage assessment does not replace that evaluation, and the importance of remaining in the ED until their evaluation is complete.  Work up initiated   [employer], PA-C 12/28/21 1726

## 2021-12-28 NOTE — Discharge Instructions (Addendum)
Please return to the ED if you have redness, swelling, warmth of the area.  If you have excessive bleeding, and cannot stop the wound for bleeding please return to the ER.  Please keep on the pressure dressing for the first 24 hours, then you can take it off.  Do not get the area wet for the first 24 hours.  And please follow-up with your primary care in 14 days for suture removal.

## 2021-12-28 NOTE — ED Notes (Signed)
Amy Stokes overriden and handed to PA. Wrong Amy Stokes ordered by PA.

## 2021-12-28 NOTE — ED Notes (Signed)
Discharge paperwork given and verbally understood. 

## 2021-12-28 NOTE — ED Provider Notes (Signed)
[facility] EMERGENCY DEPT Provider Note   CSN: [id number] Arrival date & time: 12/28/21  1551     History  Chief Complaint  Patient presents with   Leg Injury    Amy Stokes is a 71 y.o. female, hx of afib, who presents to the ED secondary to a laceration to her right lower leg, after getting it caught on her the storm door.  She is on Eliquis.  Has no tenderness to the area, but cannot get the area to stop bleeding.  Has no numbness or tingling of the area.  Has no other complaints.  Last tetanus shot was in 2015.      Home Medications Prior to Admission medications   Medication Sig Start Date End Date Taking? Authorizing Provider  acetaminophen (TYLENOL) 650 MG CR tablet Take 1,300 mg by mouth every 8 (eight) hours as needed for pain.    [provider]  apixaban (ELIQUIS) 5 MG TABS tablet Take 1 tablet (5 mg total) by mouth 2 (two) times daily. 12/10/20   [name], MD  azithromycin (ZITHROMAX) 250 MG tablet Take 1 tablet (250 mg total) by mouth daily. 500 mg day one and 250 mg daily for 4 days 12/25/21   [name], MD  Budeson-Glycopyrrol-Formoterol (BREZTRI AEROSPHERE) 160-9-4.8 MCG/ACT AERO Inhale 2 puffs into the lungs in the morning and at bedtime. with spacer and rinse mouth afterwards. 12/17/21   [name], DO  Cholecalciferol 50 MCG (2000 UT) CAPS Take 1 capsule by mouth daily. PATIENT USING OIL AND NOT CAPSULES    [provider]  diltiazem (CARDIZEM CD) 300 MG 24 hr capsule TAKE 1 CAPSULE BY MOUTH EVERY DAY 04/04/21   [name], MD  fexofenadine (ALLEGRA) 180 MG tablet TAKE 1 TABLET BY MOUTH EVERY DAY 12/25/21   Cobb, Karie Schwalbe, NP  flecainide (TAMBOCOR) 50 MG tablet TAKE 1 TABLET BY MOUTH TWICE A DAY 06/03/21   Camnitz, Will Hassell Done, MD  fluticasone (FLONASE) 50 MCG/ACT nasal spray Place 1 spray into both nostrils daily. 06/04/21   Cobb, Karie Schwalbe, NP  hydrochlorothiazide (HYDRODIURIL) 25 MG tablet TAKE  1 TABLET (25 MG TOTAL) BY MOUTH DAILY. 10/21/21   Panosh, [name], MD  ipratropium-albuterol (DUONEB) 0.5-2.5 (3) MG/3ML SOLN Take 3 mLs by nebulization every 6 (six) hours as needed. 02/28/21   Lavina Hamman, MD  KLOR-CON M10 10 MEQ tablet TAKE 2 TABLETS BY MOUTH DAILY 11/07/21   Panosh, [name], MD  levothyroxine (SYNTHROID) 100 MCG tablet TAKE 1 TABLET BY MOUTH EVERY MORNING ON AN EMPTY STOMACH Orally Once a day 90 days 04/16/21     losartan (COZAAR) 100 MG tablet TAKE 1 TABLET BY MOUTH EVERY DAY 10/17/21   Panosh, [name], MD  Lutein 10 MG TABS Take 10 mg by mouth daily.    [provider]  montelukast (SINGULAIR) 10 MG tablet TAKE 1 TABLET BY MOUTH EVERYDAY AT BEDTIME 11/27/21   Cobb, Karie Schwalbe, NP  MULTIPLE VITAMIN PO Take 1 tablet by mouth daily.    [provider]  pantoprazole (PROTONIX) 40 MG tablet TAKE 1 TABLET BY MOUTH EVERY DAY 11/06/21   Cobb, Karie Schwalbe, NP  predniSONE (DELTASONE) 20 MG tablet Take 1 tablet (20 mg total) by mouth daily with breakfast. 12/25/21   Sherrilyn Rist A, MD  PSYLLIUM PO Take 1 packet by mouth daily.    [provider]  rosuvastatin (CRESTOR) 10 MG tablet Take 1 tablet (10 mg total) by mouth daily.  09/12/21 09/07/22  Loel Dubonnet, NP  Spacer/Aero-Holding Josiah Lobo DEVI Use with spacer 06/04/21   Cobb, Karie Schwalbe, NP  valACYclovir (VALTREX) 500 MG tablet As needed 04/08/21   [provider]  albuterol (PROAIR HFA) 108 (90 Base) MCG/ACT inhaler Inhale 2 puffs into the lungs every 6 (six) hours as needed for wheezing. 01/17/20 09/12/21  Panosh, [name], MD      Allergies    Patient has no known allergies.    Review of Systems   Review of Systems  Musculoskeletal:  Negative for arthralgias and joint swelling.  Skin:  Positive for wound.    Physical Exam Updated Vital Signs BP 123/60 (BP Location: Right Arm)   Pulse 67   Temp 98.6 F (37 C) (Oral)   Resp 16   Ht '5\' 8"'$  (1.727 m)   Wt 128.8 kg   SpO2 96%   BMI 43.18  kg/m  Physical Exam Vitals and nursing note reviewed.  Constitutional:      General: She is not in acute distress.    Appearance: Normal appearance. She is well-developed.  HENT:     Head: Normocephalic and atraumatic.  Eyes:     General:        Right eye: No discharge.        Left eye: No discharge.     Conjunctiva/sclera: Conjunctivae normal.  Cardiovascular:     Pulses: Normal pulses.  Pulmonary:     Effort: No respiratory distress.  Skin:    General: Skin is warm.     Comments: 5 cm C shaped laceration on medial lower leg.   Neurological:     Mental Status: She is alert.     Comments: Clear speech.   Psychiatric:        Behavior: Behavior normal.        Thought Content: Thought content normal.     ED Results / Procedures / Treatments   Labs (all labs ordered are listed, but only abnormal results are displayed) Labs Reviewed - No data to display  EKG None  Radiology No results found.  Procedures .Marland KitchenLaceration Repair  Date/Time: 12/28/2021 8:19 PM  Performed by: Osvaldo Shipper, PA Authorized by: Osvaldo Shipper, PA   Consent:    Consent obtained:  Verbal   Consent given by:  Patient   Risks discussed:  Infection, need for additional repair, pain, poor cosmetic result and poor wound healing   Alternatives discussed:  No treatment and delayed treatment Universal protocol:    Procedure explained and questions answered to patient or proxy's satisfaction: yes     Relevant documents present and verified: yes     Test results available: yes     Imaging studies available: yes     Required blood products, implants, devices, and special equipment available: yes     Site/side marked: yes     Immediately prior to procedure, a time out was called: yes     Patient identity confirmed:  Verbally with patient Anesthesia:    Anesthesia method:  Local infiltration   Local anesthetic:  Lidocaine 2% WITH epi Laceration details:    Location:  Leg   Leg location:  R lower  leg   Length (cm):  5 Pre-procedure details:    Preparation:  Patient was prepped and draped in usual sterile fashion Exploration:    Hemostasis achieved with:  Direct pressure Treatment:    Area cleansed with:  Povidone-iodine   Irrigation solution:  Sterile saline   Irrigation  method:  Syringe Skin repair:    Repair method:  Sutures   Suture size:  5-0 and 4-0   Suture material:  Prolene   Suture technique:  Simple interrupted   Number of sutures:  16 Repair type:    Repair type:  Simple Post-procedure details:    Dressing:  Non-adherent dressing   Procedure completion:  Tolerated     Medications Ordered in ED Medications  lidocaine-EPINEPHrine (XYLOCAINE W/EPI) 2 %-1:100000 (with pres) injection 20 mL (has no administration in time range)  lidocaine-EPINEPHrine (XYLOCAINE W/EPI) 2 %-1:200000 (PF) injection (has no administration in time range)  Tdap (BOOSTRIX) injection 0.5 mL (0.5 mLs Intramuscular Given 12/28/21 1955)  lidocaine-EPINEPHrine (XYLOCAINE W/EPI) 2 %-1:200000 (PF) injection (  Given by Other 12/28/21 1943)    ED Course/ Medical Decision Making/ A&P                           Medical Decision Making Patient is a 71 year old female, here for a laceration on her right lower extremity after getting it caught on the storm door.  Tetanus updated Margo Common present, she has no neurodeficits, just as an control uncontrollable bleeding.  She had 16 stitches placed, and had control of bleeding.  Nonadherent dressing in place, with pressure dressing.  Advised on follow-up with primary care for suture removal in 14 days.  Not to get the area wet for the first 24 hours and keep pressure dressing on.  Discussed return symptoms, she voiced understanding.  She had no tenderness to palpation of the lower leg, no bruising, and her pulses were equal.  Final Clinical Impression(s) / ED Diagnoses Final diagnoses:  Laceration of right lower leg, initial encounter    Rx / DC Orders ED  Discharge Orders     None         Nicolas Sisler, Si Gaul, PA 12/28/21 2021    Ezequiel Essex, MD 12/28/21 2329

## 2022-01-01 ENCOUNTER — Ambulatory Visit (INDEPENDENT_AMBULATORY_CARE_PROVIDER_SITE_OTHER): Payer: Medicare HMO | Admitting: Pulmonary Disease

## 2022-01-01 DIAGNOSIS — R0609 Other forms of dyspnea: Secondary | ICD-10-CM

## 2022-01-01 LAB — PULMONARY FUNCTION TEST
DL/VA % pred: 149 %
DL/VA: 6 ml/min/mmHg/L
DLCO cor % pred: 98 %
DLCO cor: 21.84 ml/min/mmHg
DLCO unc % pred: 95 %
DLCO unc: 21.21 ml/min/mmHg
FEF 25-75 Post: 0.9 L/sec
FEF 25-75 Pre: 0.48 L/sec
FEF2575-%Change-Post: 89 %
FEF2575-%Pred-Post: 42 %
FEF2575-%Pred-Pre: 22 %
FEV1-%Change-Post: 20 %
FEV1-%Pred-Post: 50 %
FEV1-%Pred-Pre: 41 %
FEV1-Post: 1.34 L
FEV1-Pre: 1.11 L
FEV1FVC-%Change-Post: 10 %
FEV1FVC-%Pred-Pre: 77 %
FEV6-%Change-Post: 11 %
FEV6-%Pred-Post: 61 %
FEV6-%Pred-Pre: 54 %
FEV6-Post: 2.06 L
FEV6-Pre: 1.84 L
FEV6FVC-%Change-Post: 2 %
FEV6FVC-%Pred-Post: 104 %
FEV6FVC-%Pred-Pre: 101 %
FVC-%Change-Post: 8 %
FVC-%Pred-Post: 58 %
FVC-%Pred-Pre: 54 %
FVC-Post: 2.06 L
FVC-Pre: 1.9 L
Post FEV1/FVC ratio: 65 %
Post FEV6/FVC ratio: 100 %
Pre FEV1/FVC ratio: 59 %
Pre FEV6/FVC Ratio: 97 %
RV % pred: 124 %
RV: 2.98 L
TLC % pred: 88 %
TLC: 5.02 L

## 2022-01-01 NOTE — Progress Notes (Signed)
Full PFT Performed Today  

## 2022-01-01 NOTE — Patient Instructions (Signed)
Full PFT Performed Today  

## 2022-01-02 ENCOUNTER — Telehealth: Payer: Self-pay

## 2022-01-02 DIAGNOSIS — G4733 Obstructive sleep apnea (adult) (pediatric): Secondary | ICD-10-CM | POA: Diagnosis not present

## 2022-01-02 DIAGNOSIS — Z9989 Dependence on other enabling machines and devices: Secondary | ICD-10-CM | POA: Diagnosis not present

## 2022-01-02 NOTE — Telephone Encounter (Signed)
     Patient  visit on 9/30  at [facility]  Have you been able to follow up with your primary care physician? yes  The patient was or was not able to obtain any needed medicine or equipment. yes Are there diet recommendations that you are having difficulty following?na  Patient expresses understanding of discharge instructions and education provided has no other needs at this time.  yes     [place], Care Management  [phone] [address] [place], [place], [place] [address] Phone: [phone] Email: [email or web address]

## 2022-01-07 NOTE — Progress Notes (Unsigned)
Follow Up Note  RE: Amy Stokes MRN: [id number] DOB: 1950/04/15 Date of Office Visit: 01/08/2022  Referring provider: Burnis Medin, MD Primary care provider: Burnis Medin, MD  Chief Complaint: No chief complaint on file.  History of Present Illness: I had the pleasure of seeing Amy Stokes for a follow up visit at the [facility] on 01/07/2022. She is a 71 y.o. female, who is being followed for asthma, allergic rhinitis. Her previous allergy office visit was on 07/29/2021 with Dr. Maudie Mercury. Today is a regular follow up visit.  Not well controlled moderate persistent asthma Diagnosed with asthma for 30 years.  Currently on Advair 230 mcg 2 puffs twice a day and uses albuterol as needed less than once a month however has daily symptoms.  Main triggers include dust.  Follows with pulmonology as well.  2023 blood work showed elevated eosinophils of 300 and IgE of 1409. Today's spirometry showed severe restriction with no improvement FEV1 postbronchodilator treatment.  Clinically unchanged. Today's skin testing was positive to mold, cockroach and dust mites. Borderline positive to soy (tolerates with no issues, monitor symptoms). Read about injections for asthma - handout given.  Daily controller medication(s): start Breztri 2 puffs with spacer and rinse mouth afterwards. Samples given. This replaces Advair for now.  Let us know if it is too expensive.  Continue Singulair (montelukast) '10mg'$  daily at night. May use albuterol rescue inhaler 2 puffs or nebulizer every 4 to 6 hours as needed for shortness of breath, chest tightness, coughing, and wheezing. May use albuterol rescue inhaler 2 puffs 5 to 15 minutes prior to strenuous physical activities. Monitor frequency of use.  Get spirometry at next visit. Consider adding on biologics if no improvement.    Other allergic rhinitis Some sneezing at times.  No recent allergy testing.  Takes Allegra, Singulair and Flonase  with some benefit.  No prior ENT evaluation. Today's skin testing showed: Positive to mold, cockroach, dust mites.  Start environmental control measures as below. Use over the counter antihistamines such as Zyrtec (cetirizine), Claritin (loratadine), Allegra (fexofenadine), or Xyzal (levocetirizine) daily as needed. May take twice a day during allergy flares. May switch antihistamines every few months. Continue Singulair (montelukast) '10mg'$  daily at night. Use Flonase (fluticasone) nasal spray 1 spray per nostril twice a day as needed for nasal congestion.  Nasal saline spray (i.e., Simply Saline) or nasal saline lavage (i.e., NeilMed) is recommended as needed and prior to medicated nasal sprays.   Return in about 2 months (around 09/28/2021).  Assessment and Plan: Amy Stokes is a 72 y.o. female with: No problem-specific Assessment & Plan notes found for this encounter.  No follow-ups on file.  No orders of the defined types were placed in this encounter.  Lab Orders  No laboratory test(s) ordered today    Diagnostics: Spirometry:  Tracings reviewed. Her effort: {Blank single:19197::"Good reproducible efforts.","It was hard to get consistent efforts and there is a question as to whether this reflects a maximal maneuver.","Poor effort, data can not be interpreted."} FVC: ***L FEV1: ***L, ***% predicted FEV1/FVC ratio: ***% Interpretation: {Blank single:19197::"Spirometry consistent with mild obstructive disease","Spirometry consistent with moderate obstructive disease","Spirometry consistent with severe obstructive disease","Spirometry consistent with possible restrictive disease","Spirometry consistent with mixed obstructive and restrictive disease","Spirometry uninterpretable due to technique","Spirometry consistent with normal pattern","No overt abnormalities noted given today's efforts"}.  Please see scanned spirometry results for details.  Skin Testing: {Blank single:19197::"Select  foods","Environmental allergy panel","Environmental allergy panel and select foods","Food allergy panel","None","Deferred  due to recent antihistamines use"}. *** Results discussed with patient/family.   Medication List:  Current Outpatient Medications  Medication Sig Dispense Refill  . acetaminophen (TYLENOL) 650 MG CR tablet Take 1,300 mg by mouth every 8 (eight) hours as needed for pain.    [name] apixaban (ELIQUIS) 5 MG TABS tablet Take 1 tablet (5 mg total) by mouth 2 (two) times daily. 180 tablet 2  . azithromycin (ZITHROMAX) 250 MG tablet Take 1 tablet (250 mg total) by mouth daily. 500 mg day one and 250 mg daily for 4 days 6 tablet 0  . Budeson-Glycopyrrol-Formoterol (BREZTRI AEROSPHERE) 160-9-4.8 MCG/ACT AERO Inhale 2 puffs into the lungs in the morning and at bedtime. with spacer and rinse mouth afterwards. 10.7 g 1  . Cholecalciferol 50 MCG (2000 UT) CAPS Take 1 capsule by mouth daily. PATIENT USING OIL AND NOT CAPSULES    . diltiazem (CARDIZEM CD) 300 MG 24 hr capsule TAKE 1 CAPSULE BY MOUTH EVERY DAY 90 capsule 3  . fexofenadine (ALLEGRA) 180 MG tablet TAKE 1 TABLET BY MOUTH EVERY DAY 30 tablet 5  . flecainide (TAMBOCOR) 50 MG tablet TAKE 1 TABLET BY MOUTH TWICE A DAY 60 tablet 8  . fluticasone (FLONASE) 50 MCG/ACT nasal spray Place 1 spray into both nostrils daily. 18.2 mL 2  . hydrochlorothiazide (HYDRODIURIL) 25 MG tablet TAKE 1 TABLET (25 MG TOTAL) BY MOUTH DAILY. 90 tablet 0  . ipratropium-albuterol (DUONEB) 0.5-2.5 (3) MG/3ML SOLN Take 3 mLs by nebulization every 6 (six) hours as needed. 360 mL 0  . KLOR-CON M10 10 MEQ tablet TAKE 2 TABLETS BY MOUTH DAILY 180 tablet 0  . levothyroxine (SYNTHROID) 100 MCG tablet TAKE 1 TABLET BY MOUTH EVERY MORNING ON AN EMPTY STOMACH Orally Once a day 90 days 90 tablet 4  . losartan (COZAAR) 100 MG tablet TAKE 1 TABLET BY MOUTH EVERY DAY 90 tablet 1  . Lutein 10 MG TABS Take 10 mg by mouth daily.    . montelukast (SINGULAIR) 10 MG tablet TAKE 1  TABLET BY MOUTH EVERYDAY AT BEDTIME 90 tablet 1  . MULTIPLE VITAMIN PO Take 1 tablet by mouth daily.    . pantoprazole (PROTONIX) 40 MG tablet TAKE 1 TABLET BY MOUTH EVERY DAY 90 tablet 1  . predniSONE (DELTASONE) 20 MG tablet Take 1 tablet (20 mg total) by mouth daily with breakfast. 10 tablet 0  . PSYLLIUM PO Take 1 packet by mouth daily.    . rosuvastatin (CRESTOR) 10 MG tablet Take 1 tablet (10 mg total) by mouth daily. 90 tablet 3  . Spacer/Aero-Holding Dorise Bullion Use with spacer 1 each 2  . valACYclovir (VALTREX) 500 MG tablet As needed     No current facility-administered medications for this visit.   Allergies: No Known Allergies I reviewed her past medical history, social history, family history, and environmental history and no significant changes have been reported from her previous visit.  Review of Systems  Constitutional:  Negative for appetite change, chills, fever and unexpected weight change.  HENT:  Positive for sneezing. Negative for congestion and rhinorrhea.   Eyes:  Negative for itching.  Respiratory:  Positive for cough, chest tightness, shortness of breath and wheezing.   Cardiovascular:  Negative for chest pain.  Gastrointestinal:  Negative for abdominal pain.  Genitourinary:  Negative for difficulty urinating.  Skin:  Negative for rash.  Allergic/Immunologic: Positive for environmental allergies.  Neurological:  Negative for headaches.   Objective: There were no vitals taken for this visit. There  is no height or weight on file to calculate BMI. Physical Exam Vitals and nursing note reviewed.  Constitutional:      Appearance: Normal appearance. She is well-developed. She is obese.  HENT:     Head: Normocephalic and atraumatic.     Right Ear: Tympanic membrane and external ear normal.     Left Ear: Tympanic membrane and external ear normal.     Nose: Nose normal.     Mouth/Throat:     Mouth: Mucous membranes are moist.     Pharynx: Oropharynx is  clear.  Eyes:     Conjunctiva/sclera: Conjunctivae normal.  Cardiovascular:     Rate and Rhythm: Normal rate and regular rhythm.     Heart sounds: Normal heart sounds. No murmur heard.    No friction rub. No gallop.  Pulmonary:     Effort: Pulmonary effort is normal.     Breath sounds: Normal breath sounds. No wheezing, rhonchi or rales.  Musculoskeletal:     Cervical back: Neck supple.  Skin:    General: Skin is warm.     Findings: No rash.  Neurological:     Mental Status: She is alert and oriented to person, place, and time.  Psychiatric:        Behavior: Behavior normal.  Previous notes and tests were reviewed. The plan was reviewed with the patient/family, and all questions/concerned were addressed.  It was my pleasure to see Amy Stokes today and participate in her care. Please feel free to contact me with any questions or concerns.  Sincerely,  [name], DO Allergy & Immunology  [facility] office: [place] office: [phone]

## 2022-01-08 ENCOUNTER — Encounter: Payer: Self-pay | Admitting: Allergy

## 2022-01-08 ENCOUNTER — Ambulatory Visit (INDEPENDENT_AMBULATORY_CARE_PROVIDER_SITE_OTHER): Payer: Medicare HMO | Admitting: Allergy

## 2022-01-08 VITALS — BP 132/84 | HR 79 | Temp 97.8°F | Resp 16 | Ht 68.0 in | Wt 284.0 lb

## 2022-01-08 DIAGNOSIS — K219 Gastro-esophageal reflux disease without esophagitis: Secondary | ICD-10-CM | POA: Diagnosis not present

## 2022-01-08 DIAGNOSIS — J4550 Severe persistent asthma, uncomplicated: Secondary | ICD-10-CM | POA: Insufficient documentation

## 2022-01-08 DIAGNOSIS — J3089 Other allergic rhinitis: Secondary | ICD-10-CM | POA: Diagnosis not present

## 2022-01-08 MED ORDER — MONTELUKAST SODIUM 10 MG PO TABS: 10.0000 mg | ORAL_TABLET | Freq: Every day | ORAL | 1 refills | Status: DC

## 2022-01-08 NOTE — Assessment & Plan Note (Signed)
Wondering if she can stop her PPI. [name] You may stop Protonix and monitor symptoms. . If symptomatic then restart.

## 2022-01-08 NOTE — Assessment & Plan Note (Addendum)
Past history - Diagnosed with asthma for 30 years.  Currently on Advair 230 mcg 2 puffs twice a day and uses albuterol as needed less than once a month however has daily symptoms.  Main triggers include dust.  Follows with pulmonology as well.  2023 blood work showed elevated eosinophils of 300 and IgE of 1409. 2023 spirometry showed severe restriction with no improvement FEV1 postbronchodilator treatment.  Clinically unchanged. Interim history - marginal improvement with Breztri.   Today's spirometry showed restriction - slight improvement from previous one.  . Discussed various biologics and gave handouts. Patient would like to discuss with pulmonology prior to starting.  . Follow up with pulmonology as scheduled.  . Daily controller medication(s): Breztri 2 puffs with spacer and rinse mouth afterwards.  . May use albuterol rescue inhaler 2 puffs or nebulizer every 4 to 6 hours as needed for shortness of breath, chest tightness, coughing, and wheezing. May use albuterol rescue inhaler 2 puffs 5 to 15 minutes prior to strenuous physical activities. Monitor frequency of use.  . Get RSV vaccine - up to date with flu and Covid-19 booster.

## 2022-01-08 NOTE — Patient Instructions (Addendum)
Asthma: Read about injections for asthma - handouts given.  Follow up with pulmonology regarding starting biologics.  Daily controller medication(s): Breztri 2 puffs with spacer and rinse mouth afterwards.  May use albuterol rescue inhaler 2 puffs or nebulizer every 4 to 6 hours as needed for shortness of breath, chest tightness, coughing, and wheezing. May use albuterol rescue inhaler 2 puffs 5 to 15 minutes prior to strenuous physical activities. Monitor frequency of use.  Asthma control goals:  Full participation in all desired activities (may need albuterol before activity) Albuterol use two times or less a week on average (not counting use with activity) Cough interfering with sleep two times or less a month Oral steroids no more than once a year No hospitalizations   Environmental allergies 2023 skin testing showed: Positive to mold, cockroach, dust mites.  Continue environmental control measures as below. Use over the counter antihistamines such as Zyrtec (cetirizine), Claritin (loratadine), Allegra (fexofenadine), or Xyzal (levocetirizine) daily as needed. May take twice a day during allergy flares. May switch antihistamines every few months. Continue Singulair (montelukast) '10mg'$  daily at night. Use Flonase (fluticasone) nasal spray 1 spray per nostril twice a day as needed for nasal congestion.  Nasal saline spray (i.e., Simply Saline) or nasal saline lavage (i.e., NeilMed) is recommended as needed and prior to medicated nasal sprays.  Reflux You may stop Protonix and monitor symptoms. If things get worse then restart.  Follow up in 3 months or sooner if needed.   Get RSV vaccine.   Control of House Dust Mite Allergen Dust mite allergens are a common trigger of allergy and asthma symptoms. While they can be found throughout the house, these microscopic creatures thrive in warm, humid environments such as bedding, upholstered furniture and carpeting. Because so much time is  spent in the bedroom, it is essential to reduce mite levels there.  Encase pillows, mattresses, and box springs in special allergen-proof fabric covers or airtight, zippered plastic covers.  Bedding should be washed weekly in hot water (130 F) and dried in a hot dryer. Allergen-proof covers are available for comforters and pillows that can't be regularly washed.  Wash the allergy-proof covers every few months. Minimize clutter in the bedroom. Keep pets out of the bedroom.  Keep humidity less than 50% by using a dehumidifier or air conditioning. You can buy a humidity measuring device called a hygrometer to monitor this.  If possible, replace carpets with hardwood, linoleum, or washable area rugs. If that's not possible, vacuum frequently with a vacuum that has a HEPA filter. Remove all upholstered furniture and non-washable window drapes from the bedroom. Remove all non-washable stuffed toys from the bedroom.  Wash stuffed toys weekly. Mold Control Mold and fungi can grow on a variety of surfaces provided certain temperature and moisture conditions exist.  Outdoor molds grow on plants, decaying vegetation and soil. The major outdoor mold, Alternaria and Cladosporium, are found in very high numbers during hot and dry conditions. Generally, a late summer - fall peak is seen for common outdoor fungal spores. Rain will temporarily lower outdoor mold spore count, but counts rise rapidly when the rainy period ends. The most important indoor molds are Aspergillus and Penicillium. Dark, humid and poorly ventilated basements are ideal sites for mold growth. The next most common sites of mold growth are the bathroom and the kitchen. Outdoor (Seasonal) Mold Control Use air conditioning and keep windows closed. Avoid exposure to decaying vegetation. Avoid leaf raking. Avoid grain handling. Consider wearing a face  mask if working in moldy areas.  Indoor (Perennial) Mold Control  Maintain humidity below  50%. Get rid of mold growth on hard surfaces with water, detergent and, if necessary, 5% bleach (do not mix with other cleaners). Then dry the area completely. If mold covers an area more than 10 square feet, consider hiring an indoor environmental professional. For clothing, washing with soap and water is best. If moldy items cannot be cleaned and dried, throw them away. Remove sources e.g. contaminated carpets. Repair and seal leaking roofs or pipes. Using dehumidifiers in damp basements may be helpful, but empty the water and clean units regularly to prevent mildew from forming. All rooms, especially basements, bathrooms and kitchens, require ventilation and cleaning to deter mold and mildew growth. Avoid carpeting on concrete or damp floors, and storing items in damp areas. Cockroach Allergen Avoidance Cockroaches are often found in the homes of densely populated urban areas, schools or commercial buildings, but these creatures can lurk almost anywhere. This does not mean that you have a dirty house or living area. Block all areas where roaches can enter the home. This includes crevices, wall cracks and windows.  Cockroaches need water to survive, so fix and seal all leaky faucets and pipes. Have an exterminator go through the house when your family and pets are gone to eliminate any remaining roaches. Keep food in lidded containers and put pet food dishes away after your pets are done eating. Vacuum and sweep the floor after meals, and take out garbage and recyclables. Use lidded garbage containers in the kitchen. Wash dishes immediately after use and clean under stoves, refrigerators or toasters where crumbs can accumulate. Wipe off the stove and other kitchen surfaces and cupboards regularly.

## 2022-01-08 NOTE — Assessment & Plan Note (Signed)
Past history - No prior ENT evaluation. 2023 skin testing showed: Positive to mold, cockroach, dust mites.  Interim history - unchanged. Some sneezing fits.   Continue environmental control measures as below.  Use over the counter antihistamines such as Zyrtec (cetirizine), Claritin (loratadine), Allegra (fexofenadine), or Xyzal (levocetirizine) daily as needed. May take twice a day during allergy flares. May switch antihistamines every few months.  Continue Singulair (montelukast) '10mg'$  daily at night.  Use Flonase (fluticasone) nasal spray 1 spray per nostril twice a day as needed for nasal congestion.   Nasal saline spray (i.e., Simply Saline) or nasal saline lavage (i.e., NeilMed) is recommended as needed and prior to medicated nasal sprays.

## 2022-01-09 ENCOUNTER — Other Ambulatory Visit: Payer: Self-pay | Admitting: Internal Medicine

## 2022-01-10 ENCOUNTER — Ambulatory Visit (INDEPENDENT_AMBULATORY_CARE_PROVIDER_SITE_OTHER): Payer: Medicare HMO | Admitting: Family Medicine

## 2022-01-10 ENCOUNTER — Encounter: Payer: Self-pay | Admitting: Family Medicine

## 2022-01-10 VITALS — BP 128/64 | HR 66 | Temp 98.0°F | Wt 282.0 lb

## 2022-01-10 DIAGNOSIS — S81811D Laceration without foreign body, right lower leg, subsequent encounter: Secondary | ICD-10-CM | POA: Diagnosis not present

## 2022-01-10 NOTE — Progress Notes (Signed)
   Subjective:    Patient ID: Amy Stokes, female    DOB: Apr 06, 1950, 71 y.o.   MRN: [id number]  HPI Here to follow up an ED visit on 12-28-21 for a laceration on the right lower leg. She had caught the leg against a storm door. A total of 16 sutures were placed that day. She has been dressing it daily. It feels fine to her.    Review of Systems  Constitutional: Negative.   Respiratory: Negative.    Cardiovascular: Negative.   Skin:  Positive for wound.       Objective:   Physical Exam Constitutional:      Appearance: Normal appearance.  Cardiovascular:     Rate and Rhythm: Normal rate and regular rhythm.     Pulses: Normal pulses.     Heart sounds: Normal heart sounds.  Pulmonary:     Effort: Pulmonary effort is normal.     Breath sounds: Normal breath sounds.  Skin:    Comments: There is a curvilinear laceration on the medial right lower leg. The wound has closed and it looks clean. No signs of infection.  Neurological:     Mental Status: She is alert.           Assessment & Plan:  Leg laceration. All sutures were removed. The wound was dressed with Neoporin and a Bandaid. Recheck as needed.  Alysia Penna, MD

## 2022-01-11 ENCOUNTER — Encounter: Payer: Self-pay | Admitting: Internal Medicine

## 2022-01-14 ENCOUNTER — Other Ambulatory Visit: Payer: Self-pay

## 2022-01-14 MED ORDER — POTASSIUM CHLORIDE CRYS ER 10 MEQ PO TBCR: 10.0000 meq | EXTENDED_RELEASE_TABLET | Freq: Three times a day (TID) | ORAL | 0 refills | Status: DC

## 2022-01-20 NOTE — Telephone Encounter (Signed)
please  correct the  sig  in EHR   to get 3  10 meq per day of kcl  and give her 90 days so she doesn't run out.

## 2022-01-21 NOTE — Telephone Encounter (Signed)
New Rx with 3 tablets a day was sent on 01/14/2022. 90 days supply.

## 2022-01-27 ENCOUNTER — Encounter: Payer: Self-pay | Admitting: Pulmonary Disease

## 2022-01-27 ENCOUNTER — Ambulatory Visit: Payer: Medicare HMO | Admitting: Pulmonary Disease

## 2022-01-27 VITALS — BP 124/68 | HR 74 | Ht 68.0 in | Wt 288.6 lb

## 2022-01-27 DIAGNOSIS — R768 Other specified abnormal immunological findings in serum: Secondary | ICD-10-CM | POA: Diagnosis not present

## 2022-01-27 DIAGNOSIS — G4733 Obstructive sleep apnea (adult) (pediatric): Secondary | ICD-10-CM | POA: Diagnosis not present

## 2022-01-27 DIAGNOSIS — J4540 Moderate persistent asthma, uncomplicated: Secondary | ICD-10-CM | POA: Diagnosis not present

## 2022-01-27 NOTE — Progress Notes (Signed)
Amy Stokes    [id number]    08/24/1950  Primary Care Physician:Panosh, [name], MD  Referring Physician: Burnis Medin, MD [place],  [place] [address]  Chief complaint:   Known obstructive sleep apnea Breathing remains stable HPI:  Continues to tolerate CPAP well -6 to 8 hours of sleep Wakes up feeling like she is at a good nights rest  History of multiple allergies History of asthma -Breathing has been relatively stable -Continues Breztri -Rare need for albuterol  Exercise tolerance has been stable  Follows up with allergist  Weight has been stable  Reformed smoker, quit in 1973  Has not been wheezing or has not had unusual shortness of breath  IgE levels remain elevated -Did recently talk with allergist regarding levels as well  Outpatient Encounter Medications as of 01/27/2022  Medication Sig   acetaminophen (TYLENOL) 650 MG CR tablet Take 1,300 mg by mouth every 8 (eight) hours as needed for pain.   apixaban (ELIQUIS) 5 MG TABS tablet Take 1 tablet (5 mg total) by mouth 2 (two) times daily.   Budeson-Glycopyrrol-Formoterol (BREZTRI AEROSPHERE) 160-9-4.8 MCG/ACT AERO Inhale 2 puffs into the lungs in the morning and at bedtime. with spacer and rinse mouth afterwards.   Cholecalciferol 50 MCG (2000 UT) CAPS Take 1 capsule by mouth daily. PATIENT USING OIL AND NOT CAPSULES   diltiazem (CARDIZEM CD) 300 MG 24 hr capsule TAKE 1 CAPSULE BY MOUTH EVERY DAY   fexofenadine (ALLEGRA) 180 MG tablet TAKE 1 TABLET BY MOUTH EVERY DAY   flecainide (TAMBOCOR) 50 MG tablet TAKE 1 TABLET BY MOUTH TWICE A DAY   fluticasone (FLONASE) 50 MCG/ACT nasal spray Place 1 spray into both nostrils daily.   hydrochlorothiazide (HYDRODIURIL) 25 MG tablet TAKE 1 TABLET (25 MG TOTAL) BY MOUTH DAILY.   ipratropium-albuterol (DUONEB) 0.5-2.5 (3) MG/3ML SOLN Take 3 mLs by nebulization every 6 (six) hours as needed.   levothyroxine (SYNTHROID) 100 MCG tablet  TAKE 1 TABLET BY MOUTH EVERY MORNING ON AN EMPTY STOMACH Orally Once a day 90 days   losartan (COZAAR) 100 MG tablet TAKE 1 TABLET BY MOUTH EVERY DAY   Lutein 10 MG TABS Take 10 mg by mouth daily.   montelukast (SINGULAIR) 10 MG tablet Take 1 tablet (10 mg total) by mouth at bedtime.   MULTIPLE VITAMIN PO Take 1 tablet by mouth daily.   pantoprazole (PROTONIX) 40 MG tablet TAKE 1 TABLET BY MOUTH EVERY DAY   potassium chloride (KLOR-CON M10) 10 MEQ tablet Take 1 tablet (10 mEq total) by mouth 3 (three) times daily.   PSYLLIUM PO Take 1 packet by mouth daily.   rosuvastatin (CRESTOR) 10 MG tablet Take 1 tablet (10 mg total) by mouth daily.   Spacer/Aero-Holding Chambers DEVI Use with spacer   valACYclovir (VALTREX) 500 MG tablet As needed   [DISCONTINUED] albuterol (PROAIR HFA) 108 (90 Base) MCG/ACT inhaler Inhale 2 puffs into the lungs every 6 (six) hours as needed for wheezing.   No facility-administered encounter medications on file as of 01/27/2022.    Allergies as of 01/27/2022   (No Known Allergies)    Past Medical History:  Diagnosis Date   Arthritis    no meds   Asthma    Edema    Fibroids    Hyperlipidemia    Hypertension    Obesity    RML pneumonia 02/25/2021   Sleep apnea    wears cpap  Thyroid nodule 01/2009   left discovered on chest ct; needle bx "indeterminant" cannon r/o follicular neoplasm     Past Surgical History:  Procedure Laterality Date   ABDOMINAL HYSTERECTOMY Bilateral 08/02/2013   Procedure: TOTAL ABDOMINAL HYSTERECTOMY WITH BILATERAL SALPINGO-OOPHORECTOMY ;  Surgeon: Terrance Mass, MD;  Location: [facility];  Service: Gynecology;  Laterality: Bilateral;    Dr. Phineas Real to assist.  Dr. Zella Richer to follow with an umbilical hernia repair. He will need an additional 1 1/2 hours.    COLONOSCOPY  12/27/2007   [facility]    HERNIA REPAIR     MOUTH SURGERY     thyroid needle bx     TONSILLECTOMY     UMBILICAL HERNIA REPAIR N/A 08/02/2013   Procedure:  HERNIA REPAIR UMBILICAL ADULT WITH MESH;  Surgeon: Odis Hollingshead, MD;  Location: [facility];  Service: General;  Laterality: N/A;    Family History  Problem Relation Age of Onset   Arthritis Mother    Other Mother        neurological disorder   Pancreatic cancer Mother    Heart disease Father    Thyroid nodules Sister    Heart disease Brother        x2 1 brother deceased   Colon cancer Neg Hx    Colon polyps Neg Hx    Esophageal cancer Neg Hx    Rectal cancer Neg Hx    Stomach cancer Neg Hx     Social History   Socioeconomic History   Marital status: Married    Spouse name: Not on file   Number of children: 0   Years of education: Not on file   Highest education level: Associate degree: occupational, Hotel manager, or vocational program  Occupational History   Occupation: retired  Tobacco Use   Smoking status: Former    Packs/day: 1.00    Years: 1.00    Total pack years: 1.00    Types: Cigarettes    Quit date: 04/29/1971    Years since quitting: 50.7    Passive exposure: Never   Smokeless tobacco: Never  Vaping Use   Vaping Use: Never used  Substance and Sexual Activity   Alcohol use: Not Currently    Comment: occasionally   Drug use: No   Sexual activity: Not on file  Other Topics Concern   Not on file  Social History Narrative   Married   Occupation: Chiropractor currently unemployed   Married   Regular exercise-no   [place] of 2   Pet cat      Social Determinants of Health   Financial Resource Strain: Low Risk  (09/10/2021)   Overall Financial Resource Strain (CARDIA)    Difficulty of Paying Living Expenses: Not hard at all  Food Insecurity: No Food Insecurity (09/10/2021)   Hunger Vital Sign    Worried About Running Out of Food in the Last Year: Never true    [place] in the Last Year: Never true  Transportation Needs: No Transportation Needs (09/10/2021)   PRAPARE - Hydrologist (Medical): No    Lack of Transportation  (Non-Medical): No  Physical Activity: Sufficiently Active (09/10/2021)   Exercise Vital Sign    Days of Exercise per Week: 3 days    Minutes of Exercise per Session: 60 min  Stress: No Stress Concern Present (09/10/2021)   [place]    Feeling of Stress : Not at all  Social Connections: Unknown (05/06/2021)   Social Connection and Isolation Panel [NHANES]    Frequency of Communication with Friends and Family: Once a week    Frequency of Social Gatherings with Friends and Family: Once a week    Attends Religious Services: Patient refused    Active Member of Clubs or Organizations: No    Attends Music therapist: Not on file    Marital Status: Married  Human resources officer Violence: Not At Risk (08/28/2020)   Humiliation, Afraid, Rape, and Kick questionnaire    Fear of Current or Ex-Partner: No    Emotionally Abused: No    Physically Abused: No    Sexually Abused: No    Review of Systems  Constitutional:  Negative for fatigue.  HENT: Negative.    Eyes: Negative.   Respiratory: Negative.  Negative for shortness of breath.   Cardiovascular: Negative.  Negative for leg swelling.  Gastrointestinal: Negative.   Endocrine: Negative.   Genitourinary: Negative.     Vitals:   01/27/22 0836  BP: 124/68  Pulse: 74  SpO2: 91%     Physical Exam Constitutional:      Appearance: She is well-developed. She is obese.  HENT:     Head: Normocephalic and atraumatic.     Mouth/Throat:     Mouth: Mucous membranes are moist.  Eyes:     General:        Right eye: No discharge.        Left eye: No discharge.     Conjunctiva/sclera: Conjunctivae normal.  Neck:     Thyroid: No thyromegaly.     Trachea: No tracheal deviation.  Cardiovascular:     Rate and Rhythm: Normal rate and regular rhythm.  Pulmonary:     Effort: Pulmonary effort is normal. No respiratory distress.     Breath sounds: No stridor. No wheezing or  rhonchi.  Musculoskeletal:     Cervical back: No rigidity or tenderness.  Neurological:     Mental Status: She is alert.  Psychiatric:        Mood and Affect: Mood normal.       05/03/2018    2:00 PM  Results of the Epworth flowsheet  Sitting and reading 1  Watching TV 1  Sitting, inactive in a public place (e.g. a theatre or a meeting) 0  As a passenger in a car for an hour without a break 0  Lying down to rest in the afternoon when circumstances permit 0  Sitting and talking to someone 0  Sitting quietly after a lunch without alcohol 0  In a car, while stopped for a few minutes in traffic 0  Total score 2    Data Reviewed:  Compliance data shows 100% compliance Set between 4-20 95 percentile pressure of 10.2 Residual AHI 1.8  IgE level of 2005 Eosinophil 2900  Assessment:  Obstructive sleep apnea -Stable symptoms -Compliant with CPAP use -Benefiting from CPAP use  Class III obesity -Stable  History of asthma -Currently on Breztri -Symptoms controlled  Cough and wheezing have been better  Elevated IgE and eosinophil levels -Discussed role of Biologics -She does have information materials that she has been going through  Plan/Recommendations:   .  Continue CPAP nightly  .  Graded exercises as tolerated  .  Continue bronchodilators  .  Encouraged to call with significant concerns   Sherrilyn Rist MD [facility] 01/27/2022, 8:49 AM  CC: Panosh, [name], MD

## 2022-01-27 NOTE — Patient Instructions (Signed)
Continue Breztri, albuterol as needed  Continue using CPAP nightly -Download from the machine shows it is working well  Call with concerns or questions regarding Biologics for treatment of asthma if symptoms are not well controlled  Graded activities as tolerated  Follow-up in 6 months

## 2022-02-02 DIAGNOSIS — Z9989 Dependence on other enabling machines and devices: Secondary | ICD-10-CM | POA: Diagnosis not present

## 2022-02-02 DIAGNOSIS — G4733 Obstructive sleep apnea (adult) (pediatric): Secondary | ICD-10-CM | POA: Diagnosis not present

## 2022-02-02 NOTE — Progress Notes (Unsigned)
PCP:  Burnis Medin, MD Primary Cardiologist: [name], MD Electrophysiologist: Will Meredith Leeds, MD   Amy Stokes is a 71 y.o. female seen today for Will Meredith Leeds, MD for routine electrophysiology followup. Since last being seen in our clinic the patient reports doing good overall. She receives a couple of AF notifications on her fit bit a month. 2 in October, 3 in September. She is not symptomatic with these.  she denies chest pain, palpitations, PND, orthopnea, nausea, vomiting, dizziness, syncope, edema, weight gain, or early satiety.  She has chronic dyspnea with her Asthma  Past Medical History:  Diagnosis Date   Arthritis    no meds   Asthma    Edema    Fibroids    Hyperlipidemia    Hypertension    Obesity    RML pneumonia 02/25/2021   Sleep apnea    wears cpap    Thyroid nodule 01/2009   left discovered on chest ct; needle bx "indeterminant" cannon r/o follicular neoplasm    Past Surgical History:  Procedure Laterality Date   ABDOMINAL HYSTERECTOMY Bilateral 08/02/2013   Procedure: TOTAL ABDOMINAL HYSTERECTOMY WITH BILATERAL SALPINGO-OOPHORECTOMY ;  Surgeon: Terrance Mass, MD;  Location: [facility];  Service: Gynecology;  Laterality: Bilateral;    Dr. Phineas Real to assist.  Dr. Zella Richer to follow with an umbilical hernia repair. He will need an additional 1 1/2 hours.    COLONOSCOPY  12/27/2007   [facility]    HERNIA REPAIR     MOUTH SURGERY     thyroid needle bx     TONSILLECTOMY     UMBILICAL HERNIA REPAIR N/A 08/02/2013   Procedure: HERNIA REPAIR UMBILICAL ADULT WITH MESH;  Surgeon: Odis Hollingshead, MD;  Location: [facility];  Service: General;  Laterality: N/A;    Current Outpatient Medications  Medication Sig Dispense Refill   acetaminophen (TYLENOL) 650 MG CR tablet Take 1,300 mg by mouth every 8 (eight) hours as needed for pain.     albuterol (VENTOLIN HFA) 108 (90 Base) MCG/ACT inhaler Inhale 2 puffs into the lungs every 6 (six)  hours as needed for wheezing or shortness of breath.     apixaban (ELIQUIS) 5 MG TABS tablet Take 1 tablet (5 mg total) by mouth 2 (two) times daily. 180 tablet 2   Budeson-Glycopyrrol-Formoterol (BREZTRI AEROSPHERE) 160-9-4.8 MCG/ACT AERO Inhale 2 puffs into the lungs in the morning and at bedtime. with spacer and rinse mouth afterwards. 10.7 g 1   Cholecalciferol 50 MCG (2000 UT) CAPS Take 1 capsule by mouth daily. PATIENT USING OIL AND NOT CAPSULES     diltiazem (CARDIZEM CD) 300 MG 24 hr capsule TAKE 1 CAPSULE BY MOUTH EVERY DAY 90 capsule 3   fexofenadine (ALLEGRA) 180 MG tablet TAKE 1 TABLET BY MOUTH EVERY DAY 30 tablet 5   flecainide (TAMBOCOR) 50 MG tablet TAKE 1 TABLET BY MOUTH TWICE A DAY 180 tablet 1   fluticasone (FLONASE) 50 MCG/ACT nasal spray Place 1 spray into both nostrils daily. 18.2 mL 2   hydrochlorothiazide (HYDRODIURIL) 25 MG tablet TAKE 1 TABLET (25 MG TOTAL) BY MOUTH DAILY. 90 tablet 0   ipratropium-albuterol (DUONEB) 0.5-2.5 (3) MG/3ML SOLN Take 3 mLs by nebulization every 6 (six) hours as needed. 360 mL 0   levothyroxine (SYNTHROID) 100 MCG tablet TAKE 1 TABLET BY MOUTH EVERY MORNING ON AN EMPTY STOMACH Orally Once a day 90 days 90 tablet 4   losartan (COZAAR) 100 MG tablet TAKE 1 TABLET BY MOUTH  EVERY DAY 90 tablet 1   Lutein 10 MG TABS Take 10 mg by mouth daily.     montelukast (SINGULAIR) 10 MG tablet Take 1 tablet (10 mg total) by mouth at bedtime. 90 tablet 1   MULTIPLE VITAMIN PO Take 1 tablet by mouth daily.     pantoprazole (PROTONIX) 40 MG tablet TAKE 1 TABLET BY MOUTH EVERY DAY 90 tablet 1   potassium chloride (KLOR-CON M10) 10 MEQ tablet Take 1 tablet (10 mEq total) by mouth 3 (three) times daily. 90 tablet 0   PSYLLIUM PO Take 1 packet by mouth daily.     rosuvastatin (CRESTOR) 10 MG tablet Take 1 tablet (10 mg total) by mouth daily. 90 tablet 3   Spacer/Aero-Holding Chambers DEVI Use with spacer 1 each 2   valACYclovir (VALTREX) 500 MG tablet As needed      No current facility-administered medications for this visit.    No Known Allergies  Social History   Socioeconomic History   Marital status: Married    Spouse name: Not on file   Number of children: 0   Years of education: Not on file   Highest education level: Associate degree: occupational, Hotel manager, or vocational program  Occupational History   Occupation: retired  Tobacco Use   Smoking status: Former    Packs/day: 1.00    Years: 1.00    Total pack years: 1.00    Types: Cigarettes    Quit date: 04/29/1971    Years since quitting: 50.8    Passive exposure: Never   Smokeless tobacco: Never  Vaping Use   Vaping Use: Never used  Substance and Sexual Activity   Alcohol use: Not Currently    Comment: occasionally   Drug use: No   Sexual activity: Not on file  Other Topics Concern   Not on file  Social History Narrative   Married   Occupation: Chiropractor currently unemployed   Married   Regular exercise-no   [place] of 2   Pet cat      Social Determinants of Health   Financial Resource Strain: [place]  (09/10/2021)   Overall Financial Resource Strain (CARDIA)    Difficulty of Paying Living Expenses: Not hard at all  Food Insecurity: No [place] (09/10/2021)   Hunger Vital Sign    Worried About Running Out of Food in the Last Year: Never true    [place] in the Last Year: Never true  Transportation Needs: No Transportation Needs (09/10/2021)   PRAPARE - Hydrologist (Medical): No    Lack of Transportation (Non-Medical): No  Physical Activity: Sufficiently Active (09/10/2021)   Exercise Vital Sign    Days of Exercise per Week: 3 days    Minutes of Exercise per Session: 60 min  Stress: No Stress Concern Present (09/10/2021)   [place]    Feeling of Stress : Not at all  Social Connections: Unknown (05/06/2021)   Social Connection and Isolation Panel  [NHANES]    Frequency of Communication with Friends and Family: Once a week    Frequency of Social Gatherings with Friends and Family: Once a week    Attends Religious Services: Patient refused    Active Member of Clubs or Organizations: No    Attends Archivist Meetings: Not on file    Marital Status: Married  Intimate Partner Violence: Not At Risk (08/28/2020)   Humiliation, Afraid, Rape, and Kick  questionnaire    Fear of Current or Ex-Partner: No    Emotionally Abused: No    Physically Abused: No    Sexually Abused: No     Review of Systems: All other systems reviewed and are otherwise negative except as noted above.  Physical Exam: Vitals:   02/05/22 0807  BP: 138/68  Pulse: 72  SpO2: 97%  Weight: 288 lb (130.6 kg)  Height: '5\' 8"'$  (1.727 m)    GEN- The patient is well appearing, alert and oriented x 3 today.   HEENT: normocephalic, atraumatic; sclera clear, conjunctiva pink; hearing intact; oropharynx clear; neck supple, no JVP Lymph- no cervical lymphadenopathy Lungs- Clear to ausculation bilaterally, normal work of breathing.  No wheezes, rales, rhonchi Heart- Regular rate and rhythm, no murmurs, rubs or gallops, PMI not laterally displaced GI- soft, non-tender, non-distended, bowel sounds present, no hepatosplenomegaly Extremities- No peripheral edema. no clubbing or cyanosis; DP/PT/radial pulses 2+ bilaterally MS- no significant deformity or atrophy Skin- warm and dry, no rash or lesion Psych- euthymic mood, full affect Neuro- strength and sensation are intact  EKG is ordered. Personal review of EKG from today shows NSR at 72 bpm with stable intervals  Additional studies reviewed include: Previous EP notes.   ECHO COMPLETE WO IMAGING ENHANCING AGENT 11/01/2020 1. Left ventricular ejection fraction, by estimation, is 55 to 60%. The left ventricle has normal function. The left ventricle has no regional wall motion abnormalities. Left ventricular diastolic  parameters are indeterminate. 2. Right ventricular systolic function is normal. The right ventricular size is mildly enlarged. Tricuspid regurgitation signal is inadequate for assessing PA pressure. 3. The mitral valve is grossly normal. No evidence of mitral valve regurgitation. No evidence of mitral stenosis. 4. The aortic valve is grossly normal. Aortic valve regurgitation is not visualized. No aortic stenosis is present. 5. The inferior vena cava is dilated in size with >50% respiratory variability, suggesting right atrial pressure of 8 mmHg.    Assessment and Plan:  1.  Paroxysmal atrial fibrillation: EKG today shows NSR with stable intervals on flecainide Continue flecainide 50 mg BID Continue diltiazem 300 mg daily Continue Eliquis 5 mg twice daily for CHA2DS2-VASc of 3.   Rare alerts on her watch, but no sustained episodes. She is overall satisfied with her current regimen. Recent labs stable   2. HTN Stable on current regimen   3. HLD Continue statin   4. OSA  Encouraged nightly CPAP    5. Obesity Body mass index is 43.79 kg/m.  Encouraged lifestyle modification    Follow up with Dr. Curt Bears in 6 months  Shirley Friar, PA-C  02/05/22 8:15 AM

## 2022-02-04 ENCOUNTER — Other Ambulatory Visit: Payer: Self-pay | Admitting: Internal Medicine

## 2022-02-04 ENCOUNTER — Other Ambulatory Visit: Payer: Self-pay | Admitting: Cardiology

## 2022-02-04 ENCOUNTER — Telehealth: Payer: Self-pay | Admitting: Pharmacist

## 2022-02-04 NOTE — Progress Notes (Signed)
    Chronic Care Management Pharmacy Assistant   Name: Amy Stokes  MRN: [id number] DOB: [birth year]  02/05/2022 APPOINTMENT REMINDER  Amy Stokes was reminded to have all medications, supplements and any blood glucose and blood pressure readings available for review with Amy Stokes, Pharm. D, at her telephone visit on 02/05/2022 at 1:00.  Care Gaps: AWV - completed 09/10/21 Last BP: 124/68 on 01/27/2022 Last A1C - 6.0 on 11/12/2021 Hep C Screen - never done Pap smear - overdue Mammogram - overdue  Star Rating Drug: Losartan 100 mg - last filled 01/15/2022 90 [facility] Rosuvastatin 10 mg - last filled 11/27/2021 90 [facility] at [facility]  Any gaps in medications fill history? No  Gennie Alma [facility]  Catering manager [phone]

## 2022-02-05 ENCOUNTER — Ambulatory Visit (INDEPENDENT_AMBULATORY_CARE_PROVIDER_SITE_OTHER): Payer: Medicare HMO | Admitting: Pharmacist

## 2022-02-05 ENCOUNTER — Encounter: Payer: Self-pay | Admitting: Student

## 2022-02-05 ENCOUNTER — Ambulatory Visit: Payer: Medicare HMO | Attending: Student | Admitting: Student

## 2022-02-05 VITALS — BP 138/68 | HR 72 | Ht 68.0 in | Wt 288.0 lb

## 2022-02-05 DIAGNOSIS — I480 Paroxysmal atrial fibrillation: Secondary | ICD-10-CM | POA: Diagnosis not present

## 2022-02-05 DIAGNOSIS — J45909 Unspecified asthma, uncomplicated: Secondary | ICD-10-CM

## 2022-02-05 DIAGNOSIS — G4733 Obstructive sleep apnea (adult) (pediatric): Secondary | ICD-10-CM

## 2022-02-05 DIAGNOSIS — I10 Essential (primary) hypertension: Secondary | ICD-10-CM

## 2022-02-05 DIAGNOSIS — E782 Mixed hyperlipidemia: Secondary | ICD-10-CM | POA: Diagnosis not present

## 2022-02-05 NOTE — Patient Instructions (Signed)
Hi Amy Stokes,  It was great to catch up again! Please bring your blood pressure cuff to your next visit with Dr. Harrell Gave to make sure it's pretty accurate.  Don't forget to taper your pantoprazole to every other day before stopping it cold [name] to avoid that fake rebound heartburn that we discussed.  Please reach out to me if you have any questions or need anything before our follow up!  Best, Maddie  [name], PharmD, [facility]   Visit Information   Goals Addressed   None    Patient Care Plan: CCM Pharmacy Care Plan     Problem Identified: Problem: Hypertension, Hyperlipidemia, GERD, Asthma, Hypothyroidism, Allergic Rhinitis and Jaw pain      Long-Range Goal: Patient-Specific Goal   Start Date: 08/07/2020  Expected End Date: 08/07/2021  Recent Progress: On track  Priority: High  Note:   Current Barriers:  Unable to independently monitor therapeutic efficacy  Pharmacist Clinical Goal(s):  Patient will achieve adherence to monitoring guidelines and medication adherence to achieve therapeutic efficacy maintain control of blood pressure as evidenced by home and office BP readings  through collaboration with PharmD and provider.   Interventions: 1:1 collaboration with Panosh, [name], MD regarding development and update of comprehensive plan of care as evidenced by provider attestation and co-signature Inter-disciplinary care team collaboration (see longitudinal plan of care) Comprehensive medication review performed; medication list updated in electronic medical record  Hypertension (BP goal <130/80) -Controlled -Current treatment: Losartan 100 mg 1 tablet daily - in PM (2:30-4pm) - Appropriate, Effective, Safe, Accessible Hydrochlorothiazide 25 mg 1 capsule daily - in AM - Appropriate, Effective, Safe, Accessible Diltiazem 300 mg 1 capsule daily - in AM - Appropriate, Effective, Safe,  Accessible Potassium 10 mEq 3 tablets daily  - Appropriate, Effective, Safe, Accessible -Medications previously tried: none -Current home readings: 117/61 HR 75, 116/57 HR 69, 128/61 HR 69, 135/64 (arm BP cuff; could not recall how close it was to office BP cuff) -Current dietary habits: did not discuss -Current exercise habits: patient joined [employer] and is going 3 days a week (cross trainer x 15 minutes, weight bearing for arms & legs, core exercises) -Denies hypotensive/hypertensive symptoms -Educated on Exercise goal of 150 minutes per week; Importance of home blood pressure monitoring; Proper BP monitoring technique; -Counseled to monitor BP at home twice weekly, document, and provide log at future appointments -Counseled on diet and exercise extensively Recommended to continue current medication  Hyperlipidemia: (LDL goal < 100) -Controlled -Current treatment: Rosuvastatin 10 mg 1 tablet daily - Appropriate, Effective, Safe, Accessible -Medications previously tried: none (records indicate Lipitor and Mevacor but patient denies ever taking these)   -Current dietary patterns: got an air fryer; a lot less cooking oil; eating fish more often; trying to eat less overall; cut out fried foods and no red meat -Current exercise habits: patient joined [employer] and is going 3 days a week (cross trainer x 15 minutes, weight bearing for arms & legs, core exercises), water yoga on Wednesdays -Educated on Cholesterol goals;  Benefits of statin for ASCVD risk reduction; Importance of limiting foods high in cholesterol; Exercise goal of 150 minutes per week; -Counseled on diet and exercise extensively  Atrial Fibrillation (Goal: prevent stroke and major bleeding) -Controlled -CHADSVASC: 3 -Current treatment: Rate/rhythm control: Diltiazem 300 mg 1 capsule daily - Appropriate, Effective, Safe, Accessible; flecainide 50 mg 1 tablet twice daily - Appropriate, Effective, Safe,  Accessible Anticoagulation: Eliquis 5 mg 1 tablet twice  daily - Appropriate, Effective, Safe, Query accessible -Medications previously tried: none -Home BP and HR readings: refer to above  -Counseled on increased risk of stroke due to Afib and benefits of anticoagulation for stroke prevention; bleeding risk associated with Eliquis and importance of self-monitoring for signs/symptoms of bleeding; avoidance of NSAIDs due to increased bleeding risk with anticoagulants; -Assessed patient finances. Patient will apply again for Eliquis PAP.[name]  Asthma (Goal: control symptoms) -Controlled -Current treatment  Breztri 2 puffs twice daily - Appropriate, Effective, Safe, Query accessible Albuterol HFA 1 puff every 6 hours as needed - Appropriate, Effective, Safe, Accessible Montelukast 10 mg 1 tablet daily - Appropriate, Effective, Safe, Accessible -Medications previously tried: Advair  -Pulmonary function testing: n/a -Patient reports consistent use of maintenance inhaler -Frequency of rescue inhaler use: a couple of times within the past few weeks -Counseled on Benefits of consistent maintenance inhaler use When to use rescue inhaler -Recommended to continue current medication Assessed patient finances. Patient will quality for Breztri PAP.  Hypothyroidism (Goal: TSH 0.35-4.5) -Controlled -Current treatment  Levothyroxine 100 mcg 1 tablet daily - Appropriate, Effective, Safe, Accessible -Medications previously tried: none  -Recommended to continue current medication  GERD (Goal: minimize symptoms) -Controlled -Current treatment  Pantoprazole 40 mg 1 tablet daily - Appropriate, Effective, Safe, Accessible -Medications previously tried: omeprazole (no longer needed)  -Recommended to continue current medication  Allergic rhinitis (Goal: minimize symptoms) -Controlled -Current treatment  Allegra 180 mg 1 tablet daily - Appropriate, Effective, Safe, Accessible Montelukast 10 mg 1 tablet  daily - Appropriate, Effective, Safe, Accessible -Medications previously tried: none  -Counseled on avoidance of triggers (dust)  Jaw pain (Goal: minimize pain) -Controlled -Current treatment  Acetaminophen 650 mg 2 tablet as needed (before bed for jaw pain) - Appropriate, Effective, Safe, Accessible -Medications previously tried: none  -Recommended to continue current medication Counseled on maximum recommended dose of 3,000 mg/day of tylenol; discussed that Tylenol is found in many over the counter products (especially cough and cold medicine)    Health Maintenance -Vaccine gaps: none -Current therapy:  Multivitamin 1 tablet daily Vitamin D 2000 units daily Areds 2 1 capsule twice daily -Educated on Cost vs benefit of each product must be carefully weighed by individual consumer -Patient is satisfied with current therapy and denies issues -Recommended to continue current medication   Patient Goals/Self-Care Activities Patient will:  - take medications as prescribed check blood pressure twice weekly, document, and provide at future appointments target a minimum of 150 minutes of moderate intensity exercise weekly  Follow Up Plan: Telephone follow up appointment with care management team member scheduled for: 6 months       Patient verbalizes understanding of instructions and care plan provided today and agrees to view in [place]. Active MyChart status and patient understanding of how to access instructions and care plan via MyChart confirmed with patient.    Telephone follow up appointment with pharmacy team member scheduled for: 6 months  [name], [facility]

## 2022-02-05 NOTE — Patient Instructions (Signed)
Medication Instructions:  Your physician recommends that you continue on your current medications as directed. Please refer to the Current Medication list given to you today.  *If you need a refill on your cardiac medications before your next appointment, please call your pharmacy*   Lab Work: None If you have labs (blood work) drawn today and your tests are completely normal, you will receive your results only by: [place] (if you have MyChart) OR A paper copy in the mail If you have any lab test that is abnormal or we need to change your treatment, we will call you to review the results.   Follow-Up: At [facility], you and your health needs are our priority.  As part of our continuing mission to provide you with exceptional heart care, we have created designated Provider Care Teams.  These Care Teams include your primary Cardiologist (physician) and Advanced Practice Providers (APPs -  Physician Assistants and Nurse Practitioners) who all work together to provide you with the care you need, when you need it.  Your next appointment:   6 month(s)  The format for your next appointment:   In Person  Provider:   Allegra Lai, MD     Important Information About Sugar

## 2022-02-05 NOTE — Progress Notes (Signed)
Chronic Care Management Pharmacy Note  02/05/2022 Name:  Amy Stokes MRN:  [id number] DOB:  21-May-1950  Summary: BP at goal < 130/80 but varies significantly between office and home BP readings   Recommendations/Changes made from today's visit: -Recommended bringing BP cuff to office visit to ensure accuracy -Recommended tapering pantoprazole to every other day prior to stopping to avoid rebound heartburn  Plan: BP assessment in 3 months Follow up in 6 months  Subjective: Amy Stokes is an 71 y.o. year old female who is a primary patient of Amy Stokes.  The CCM team was consulted for assistance with disease management and care coordination needs.    Engaged with patient by telephone for follow up visit in response to provider referral for pharmacy case management and/or care coordination services.   Consent to Services:  The patient was given information about Chronic Care Management services, agreed to services, and gave verbal consent prior to initiation of services.  Please see initial visit note for detailed documentation.   Patient Care Team: Amy Stokes as PCP - General Amy Stokes as PCP - Cardiology (Cardiology) Amy Stokes as PCP - Electrophysiology (Cardiology) Amy Stokes as Consulting Physician (Endocrinology) Amy Stokes as Consulting Physician (Pulmonary Disease) Amy Stokes as Consulting Physician (Obstetrics and Gynecology) Amy Stokes, [facility] as Pharmacist (Pharmacist) Amy Stokes as Consulting Physician (Allergy)  Recent office visits: 01/10/22 Alysia Penna, Stokes: Patient presented for leg laceration. Removed sutures.   11/12/21 Shanon Ace, Stokes: Patient presented for annual exam.  Increased potassium to 30 mEq per day. Repeat BMP in 2-3 weeks.   09/10/2021 [name] LPN - Medicare annual wellness exam.    Recent consult visits: 01/27/22 Amy Stokes  (pulmonary): Patient presented for OSA follow up. Follow up in 6 months.   01/08/22 Amy Stokes (asthma & allergy): Patient presented for follow up for not well controlled asthma. Consider biologics. Advised stopping pantoprazole and monitoring for symptoms. Follow up in 3 months.  01/01/22 Amy Stokes (pulmonary): Patient presented for PFTs.  12/25/21 Amy Stokes (pulmonary): Patient presented for dyspnea on exertion.  Follow up in 4 weeks after PFTs.  12/19/21 Amy Stokes (ophthalmology): Patient presented for eye exam. Unable to access notes.  10/21/2021 Amy Stokes. Patient was seen for essential hypertension an subclinical iodine-deficiency hypothyroidism. No other chart notes.    09/12/2021 Amy Stokes (pulmonary) - Patient was seen for cough and additional issues. Discontinued Albuterol HFA. Follow up in 3 months.    08/28/2021 Amy Stokes (cardiology) - Patient was seen for PAF (paroxysmal atrial fibrillation) and additional issues. No medication changes. Follow up in 6 months.   Hospital visits: Presented to [facility] ED on 12/28/21 due to leg injury. Present for 1 hour. New?Medications Started:?? None Medication Changes: No medication changes Medications Discontinued:: No medications discontinued Medications that remain the same: -All other medications will remain the same.    Objective:  Lab Results  Component Value Date   CREATININE 0.80 11/29/2021   BUN 17 11/29/2021   GFR 74.52 11/29/2021   GFRNONAA >60 02/28/2021   GFRAA 95 05/10/2008   NA 138 11/29/2021   K 3.7 11/29/2021   CALCIUM 9.1 11/29/2021   CO2 35 (H) 11/29/2021   GLUCOSE 96 11/29/2021    Lab Results  Component Value Date/Time   HGBA1C 6.0 11/12/2021 10:43 AM   HGBA1C 5.7 (  A) 10/24/2020 12:35 PM   HGBA1C 6.0 06/10/2019 10:16 AM   GFR 74.52 11/29/2021 07:53 AM   GFR 88.12 11/12/2021 10:43 AM    Last diabetic Eye exam: No  results found for: "HMDIABEYEEXA"  Last diabetic Foot exam: No results found for: "HMDIABFOOTEX"   Lab Results  Component Value Date   CHOL 134 11/07/2021   HDL 48 11/07/2021   LDLCALC 76 11/07/2021   LDLDIRECT 112 (H) 01/10/2021   TRIG 42 11/07/2021   CHOLHDL 2.8 11/07/2021       Latest Ref Rng & Units 11/07/2021    8:14 AM 09/10/2021    9:36 AM 02/26/2021    4:21 AM  Hepatic Function  Total Protein 6.0 - 8.5 g/dL 6.4  6.5  7.2   Albumin 3.9 - 4.9 g/dL 3.9  3.7  3.1   AST 0 - 40 IU/L _0 ALT 0 - 32 IU/L _1 Alk Phosphatase 44 - 121 IU/L 103  104  72   Total Bilirubin 0.0 - 1.2 mg/dL 0.5  0.7  0.4   Bilirubin, Direct 0.00 - 0.40 mg/dL 0.15       Lab Results  Component Value Date/Time   TSH 2.00 10/12/2020 12:00 AM   TSH 2.21 06/10/2019 10:16 AM   TSH 1.95 11/04/2017 11:56 AM   FREET4 0.82 04/19/2015 09:47 AM   FREET4 1.0 07/16/2009 03:14 PM       Latest Ref Rng & Units 12/25/2021   11:10 AM 11/12/2021   10:43 AM 06/04/2021   10:55 AM  CBC  WBC 4.0 - 10.5 K/uL 7.7  7.0  6.6   Hemoglobin 12.0 - 15.0 g/dL 12.5  12.5  12.6   Hematocrit 36.0 - 46.0 % 37.9  38.9  38.2   Platelets 150.0 - 400.0 K/uL 248.0  253.0  251.0     Lab Results  Component Value Date/Time   VD25OH 38.91 04/19/2015 09:47 AM    CHA2DS2/VAS Stroke Risk Points  Current as of 5 hours ago     3 >= 2 Points: High Risk  1 - 1.99 Points: Medium Risk  0 Points: Low Risk    No Change      Details    This score determines the patient's risk of having a stroke if the  patient has atrial fibrillation.       Points Metrics  0 Has Congestive Heart Failure:  No    Current as of 5 hours ago  0 Has Vascular Disease:  No    Current as of 5 hours ago  1 Has Hypertension:  Yes    Current as of 5 hours ago  1 Age:  71    Current as of 5 hours ago  0 Has Diabetes:  No    Current as of 5 hours ago  0 Had Stroke:  No  Had TIA:  No  Had Thromboembolism:  No    Current as of 5 hours ago  1  Female:  Yes    Current as of 5 hours ago          Clinical ASCVD: No  The 10-year ASCVD risk score (Arnett DK, et al., 2019) is: 13.3%   Values used to calculate the score:     Age: 71 years     Sex: Female     Is Non-Hispanic African American: No     Diabetic: No     Tobacco smoker:  No     Systolic Blood Pressure: 269 mmHg     Is BP treated: Yes     HDL Cholesterol: 48 mg/dL     Total Cholesterol: 134 mg/dL       01/10/2022    8:18 AM 11/12/2021   10:08 AM 09/10/2021    8:30 AM  Depression screen PHQ 2/9  Decreased Interest 0 0 0  Down, Depressed, Hopeless 0 0 0  PHQ - 2 Score 0 0 0  Altered sleeping 0 0   Tired, decreased energy 0 0   Change in appetite 0 0   Feeling bad or failure about yourself  0 0   Trouble concentrating 0 0   Moving slowly or fidgety/restless 0 0   Suicidal thoughts 0 0   PHQ-9 Score 0 0   Difficult doing work/chores Not difficult at all        Social History   Tobacco Use  Smoking Status Former   Packs/day: 1.00   Years: 1.00   Total pack years: 1.00   Types: Cigarettes   Quit date: 04/29/1971   Years since quitting: 50.8   Passive exposure: Never  Smokeless Tobacco Never   BP Readings from Last 3 Encounters:  02/05/22 138/68  01/27/22 124/68  01/10/22 128/64   Pulse Readings from Last 3 Encounters:  02/05/22 72  01/27/22 74  01/10/22 66   Wt Readings from Last 3 Encounters:  02/05/22 288 lb (130.6 kg)  01/27/22 288 lb 9.6 oz (130.9 kg)  01/10/22 282 lb (127.9 kg)   BMI Readings from Last 3 Encounters:  02/05/22 43.79 kg/m  01/27/22 43.88 kg/m  01/10/22 42.88 kg/m    Assessment/Interventions: Review of patient past medical history, allergies, medications, health status, including review of consultants reports, laboratory and other test data, was performed as part of comprehensive evaluation and provision of chronic care management services.   SDOH:  (Social Determinants of Health) assessments and interventions  performed: Yes  SDOH Interventions    Flowsheet Row Chronic Care Management from 02/05/2022 in [place] [facility] [place] Management from 01/09/2020 in [place] [facility]  SDOH Interventions    Transportation Interventions -- Intervention Not Indicated  Financial Strain Interventions Other (Comment)  [patient approved for assistance] Intervention Not Indicated      SDOH Screenings   Food Insecurity: No Food Insecurity (09/10/2021)  Housing: Low Risk  (05/06/2021)  Transportation Needs: No Transportation Needs (09/10/2021)  Alcohol Screen: Low Risk  (05/06/2021)  Depression (PHQ2-9): Low Risk  (01/10/2022)  Financial Resource Strain: Low Risk  (02/05/2022)  Physical Activity: Sufficiently Active (09/10/2021)  Social Connections: Unknown (05/06/2021)  Stress: No Stress Concern Present (09/10/2021)  Tobacco Use: Medium Risk (02/05/2022)    [place]  No Known Allergies  Medications Reviewed Today     Reviewed by Amy Stokes, [facility] (Pharmacist) on 02/05/22 at 21  Med List Status: <None>   Medication Order Taking? Sig Documenting Provider Last Dose Status Informant  acetaminophen (TYLENOL) 650 MG CR tablet [id number]  Take 1,300 mg by mouth every 8 (eight) hours as needed for pain. Provider, Historical, Stokes  Active Self  albuterol (VENTOLIN HFA) 108 (90 Base) MCG/ACT inhaler [id number]  Inhale 2 puffs into the lungs every 6 (six) hours as needed for wheezing or shortness of breath. Provider, Historical, Stokes  Active   apixaban (ELIQUIS) 5 MG TABS tablet [id number]  Take 1 tablet (5 mg total) by mouth 2 (two) times daily. Amy Stokes  Active  Self  Budeson-Glycopyrrol-Formoterol (BREZTRI AEROSPHERE) 160-9-4.8 MCG/ACT AERO [id number]  Inhale 2 puffs into the lungs in the morning and at bedtime. with spacer and rinse mouth afterwards. Amy Stokes  Active   Cholecalciferol 50 MCG (2000 UT) CAPS [id number]  Take 1 capsule by mouth daily. PATIENT  USING OIL AND NOT CAPSULES Provider, Historical, Stokes  Active Self  diltiazem (CARDIZEM CD) 300 MG 24 hr capsule [id number]  TAKE 1 CAPSULE BY MOUTH EVERY DAY Amy Stokes  Active   fexofenadine (ALLEGRA) 180 MG tablet [id number] Yes TAKE 1 TABLET BY MOUTH EVERY DAY Cobb, Karie Schwalbe, Stokes Taking Active   flecainide (TAMBOCOR) 50 MG tablet [id number]  TAKE 1 TABLET BY MOUTH TWICE A DAY Camnitz, Will Hassell Done, Stokes  Active   fluticasone (FLONASE) 50 MCG/ACT nasal spray [id number]  Place 1 spray into both nostrils daily. Cobb, Karie Schwalbe, Stokes  Active   hydrochlorothiazide (HYDRODIURIL) 25 MG tablet [id number] Yes TAKE 1 TABLET (25 MG TOTAL) BY MOUTH DAILY. Amy Stokes Taking Active   ipratropium-albuterol (DUONEB) 0.5-2.5 (3) MG/3ML SOLN [id number]  Take 3 mLs by nebulization every 6 (six) hours as needed. Lavina Hamman, Stokes  Active   levothyroxine (SYNTHROID) 100 MCG tablet [id number]  TAKE 1 TABLET BY MOUTH EVERY MORNING ON AN EMPTY STOMACH Orally Once a day 90 days   Active   losartan (COZAAR) 100 MG tablet [id number] Yes TAKE 1 TABLET BY MOUTH EVERY DAY Amy Stokes Taking Active   Lutein 10 MG TABS [id number] Yes Take 10 mg by mouth daily. Provider, Historical, Stokes Taking Active Self  montelukast (SINGULAIR) 10 MG tablet [id number]  Take 1 tablet (10 mg total) by mouth at bedtime. Amy Stokes  Active   MULTIPLE VITAMIN PO [id number] Yes Take 1 tablet by mouth daily. Provider, Historical, Stokes Taking Active Self  Multiple Vitamins-Minerals (PRESERVISION AREDS 2 PO) [id number] Yes Take 1 tablet by mouth in the morning and at bedtime. Provider, Historical, Stokes Taking Active   pantoprazole (PROTONIX) 40 MG tablet [id number]  TAKE 1 TABLET BY MOUTH EVERY DAY Cobb, Karie Schwalbe, Stokes  Active   potassium chloride (KLOR-CON M10) 10 MEQ tablet [id number]  Take 1 tablet (10 mEq total) by mouth 3 (three) times daily. Amy Stokes  Active   rosuvastatin (CRESTOR) 10 MG tablet [id number] Yes Take 1  tablet (10 mg total) by mouth daily. Loel Dubonnet, Stokes Taking Active   Spacer/Aero-Holding Parker DEVI [id number]  Use with spacer Clayton Bibles, Stokes  Active   valACYclovir (VALTREX) 500 MG tablet [id number]  As needed Provider, Historical, Stokes  Active             Patient Active Problem List   Diagnosis Date Noted   Not well controlled severe persistent asthma 01/08/2022   Gastroesophageal reflux disease 01/08/2022   Other allergic rhinitis 07/29/2021   History of eczema as a child 07/29/2021   Elevated IgE level 07/05/2021   Upper airway cough syndrome 06/04/2021   Bilateral lower extremity edema 06/04/2021   Atrial fibrillation, chronic ([place]) 02/27/2021   Hypothyroidism 02/27/2021   Hypokalemia 02/27/2021   Cough 08/09/2015   Iliotibial band syndrome affecting left lower leg 05/26/2014   Arthritis of left lower extremity 05/26/2014   Low HDL (under 40) 04/08/2014   Thyroid nodule 02/12/2014   OSA (obstructive sleep apnea) 73/42/8768   Seroma complicating a procedure 08/18/2013   Bleeding gums 06/09/2011   ALLERGY 01/31/2010   MORBID OBESITY 07/16/2009  Multinodular goiter (nontoxic) 01/13/2008   RECTAL BLEEDING 11/22/2007   UNS ADVRS EFF UNS RX MEDICINAL&BIOLOGICAL SBSTNC 07/26/2007   HYPERLIPIDEMIA 04/06/2007   Essential hypertension 03/05/2007   TACHYCARDIA 03/05/2007   Not well controlled moderate persistent asthma 12/28/2006    Immunization History  Administered Date(s) Administered   COVID-19, mRNA, vaccine(Comirnaty)12 years and older 12/17/2021   Fluad Quad(high Dose 65+) 12/07/2019, 01/08/2021, 12/17/2021   Influenza Split 11/04/2012, 01/04/2014   Influenza, High Dose Seasonal PF 11/06/2016, 01/26/2018, 02/16/2019   Influenza,inj,Quad PF,6+ Mos 04/19/2015, 12/27/2015   Influenza-Unspecified 11/06/2016, 12/07/2019, 12/17/2021, 12/17/2021   Moderna Covid-19 Vaccine Bivalent Booster 4yr & up 12/17/2021   PFIZER Comirnaty(Gray Top)Covid-19 Tri-Sucrose  Vaccine 07/10/2020, 12/17/2021   PFIZER(Purple Top)SARS-COV-2 Vaccination 11/18/2018, 12/15/2018, 11/03/2019   Pfizer Covid-19 Vaccine Bivalent Booster 150yr& up 01/08/2021   Pneumococcal Conjugate-13 04/03/2016   Pneumococcal Polysaccharide-23 01/31/2010, 04/11/2020   Tdap 07/28/2013, 12/28/2021   Zoster Recombinat (Shingrix) 06/13/2017, 09/27/2017   Patient has not stopped taking the pantoprazole yet as she wanted to finish out the prescription to see how she feels. Recommended every other day to taper off instead of stopping cold [place] Patient inquired about taking klor con all at once and informed her that she could.   Patient just saw the cardiologist today and was told everything was good. Her BP reading from today at home with her cuff was 20 mmHg off from office reading. She did bring cuff with her to see Dr. ChHarrell Gaven the past but cannot recall how long ago that was. She will bring it with her in December as well. Patient is not having dizziness or lightheadedness.   Conditions to be addressed/monitored:  Hypertension, Hyperlipidemia, GERD, Asthma, Hypothyroidism, Allergic Rhinitis and Jaw pain  Conditions addressed this visit: Hypertension, asthma, allergic rhinitis  Care Plan : CCM Pharmacy Care Plan  Updates made by PrViona GilmoreRPNewtonince 02/05/2022 12:00 AM     Problem: Problem: Hypertension, Hyperlipidemia, GERD, Asthma, Hypothyroidism, Allergic Rhinitis and Jaw pain      Long-Range Goal: Patient-Specific Goal   Start Date: 08/07/2020  Expected End Date: 08/07/2021  Recent Progress: On track  Priority: High  Note:   Current Barriers:  Unable to independently monitor therapeutic efficacy  Pharmacist Clinical Goal(s):  Patient will achieve adherence to monitoring guidelines and medication adherence to achieve therapeutic efficacy maintain control of blood pressure as evidenced by home and office BP readings  through collaboration with PharmD and provider.    Interventions: 1:1 collaboration with Panosh, WaStandley BrookingMD regarding development and update of comprehensive plan of care as evidenced by provider attestation and co-signature Inter-disciplinary care team collaboration (see longitudinal plan of care) Comprehensive medication review performed; medication list updated in electronic medical record  Hypertension (BP goal <130/80) -Controlled -Current treatment: Losartan 100 mg 1 tablet daily - in PM (2:30-4pm) - Appropriate, Effective, Safe, Accessible Hydrochlorothiazide 25 mg 1 capsule daily - in AM - Appropriate, Effective, Safe, Accessible Diltiazem 300 mg 1 capsule daily - in AM - Appropriate, Effective, Safe, Accessible Potassium 10 mEq 3 tablets daily  - Appropriate, Effective, Safe, Accessible -Medications previously tried: none -Current home readings: 117/61 HR 75, 116/57 HR 69, 128/61 HR 69, 135/64 (arm BP cuff; could not recall how close it was to office BP cuff) -Current dietary habits: did not discuss -Current exercise habits: patient joined [employer] is going 3 days a week (cross trainer x 15 minutes, weight bearing for arms & legs, core exercises) -Denies hypotensive/hypertensive symptoms -Educated on  Exercise goal of 150 minutes per week; Importance of home blood pressure monitoring; Proper BP monitoring technique; -Counseled to monitor BP at home twice weekly, document, and provide log at future appointments -Counseled on diet and exercise extensively Recommended to continue current medication  Hyperlipidemia: (LDL goal < 100) -Controlled -Current treatment: Rosuvastatin 10 mg 1 tablet daily - Appropriate, Effective, Safe, Accessible -Medications previously tried: none (records indicate Lipitor and Mevacor but patient denies ever taking these)   -Current dietary patterns: got an air fryer; a lot less cooking oil; eating fish more often; trying to eat less overall; cut out fried foods and no red meat -Current exercise  habits: patient joined [employer] and is going 3 days a week (cross trainer x 15 minutes, weight bearing for arms & legs, core exercises), water yoga on Wednesdays -Educated on Cholesterol goals;  Benefits of statin for ASCVD risk reduction; Importance of limiting foods high in cholesterol; Exercise goal of 150 minutes per week; -Counseled on diet and exercise extensively  Atrial Fibrillation (Goal: prevent stroke and major bleeding) -Controlled -CHADSVASC: 3 -Current treatment: Rate/rhythm control: Diltiazem 300 mg 1 capsule daily - Appropriate, Effective, Safe, Accessible; flecainide 50 mg 1 tablet twice daily - Appropriate, Effective, Safe, Accessible Anticoagulation: Eliquis 5 mg 1 tablet twice daily - Appropriate, Effective, Safe, Query accessible -Medications previously tried: none -Home BP and HR readings: refer to above  -Counseled on increased risk of stroke due to Afib and benefits of anticoagulation for stroke prevention; bleeding risk associated with Eliquis and importance of self-monitoring for signs/symptoms of bleeding; avoidance of NSAIDs due to increased bleeding risk with anticoagulants; -Assessed patient finances. Patient will apply again for Eliquis PAP.Amy Kitchen  Asthma (Goal: control symptoms) -Controlled -Current treatment  Breztri 2 puffs twice daily - Appropriate, Effective, Safe, Query accessible Albuterol HFA 1 puff every 6 hours as needed - Appropriate, Effective, Safe, Accessible Montelukast 10 mg 1 tablet daily - Appropriate, Effective, Safe, Accessible -Medications previously tried: Advair  -Pulmonary function testing: n/a -Patient reports consistent use of maintenance inhaler -Frequency of rescue inhaler use: a couple of times within the past few weeks -Counseled on Benefits of consistent maintenance inhaler use When to use rescue inhaler -Recommended to continue current medication Assessed patient finances. Patient will quality for Breztri  PAP.  Hypothyroidism (Goal: TSH 0.35-4.5) -Controlled -Current treatment  Levothyroxine 100 mcg 1 tablet daily - Appropriate, Effective, Safe, Accessible -Medications previously tried: none  -Recommended to continue current medication  GERD (Goal: minimize symptoms) -Controlled -Current treatment  Pantoprazole 40 mg 1 tablet daily - Appropriate, Effective, Safe, Accessible -Medications previously tried: omeprazole (no longer needed)  -Recommended to continue current medication  Allergic rhinitis (Goal: minimize symptoms) -Controlled -Current treatment  Allegra 180 mg 1 tablet daily - Appropriate, Effective, Safe, Accessible Montelukast 10 mg 1 tablet daily - Appropriate, Effective, Safe, Accessible -Medications previously tried: none  -Counseled on avoidance of triggers (dust)  Jaw pain (Goal: minimize pain) -Controlled -Current treatment  Acetaminophen 650 mg 2 tablet as needed (before bed for jaw pain) - Appropriate, Effective, Safe, Accessible -Medications previously tried: none  -Recommended to continue current medication Counseled on maximum recommended dose of 3,000 mg/day of tylenol; discussed that Tylenol is found in many over the counter products (especially cough and cold medicine)    Health Maintenance -Vaccine gaps: none -Current therapy:  Multivitamin 1 tablet daily Vitamin D 2000 units daily Areds 2 1 capsule twice daily -Educated on Cost vs benefit of each product must be carefully weighed by individual consumer -  Patient is satisfied with current therapy and denies issues -Recommended to continue current medication   Patient Goals/Self-Care Activities Patient will:  - take medications as prescribed check blood pressure twice weekly, document, and provide at future appointments target a minimum of 150 minutes of moderate intensity exercise weekly  Follow Up Plan: Telephone follow up appointment with care management team member scheduled for: 6  months       Medication Assistance: None required.  Patient affirms current coverage meets needs.  Compliance/Adherence/Medication fill history: Care Gaps: PAP smear, Hep C screening, mammogram  Last BP: 124/68 on 01/27/2022 Last A1C - 6.0 on 11/12/2021  Star-Rating Drugs: Losartan 100 mg - [facility] Rosuvastatin 10 mg - last filled [facility]/27/2021 [facility]  Patient's preferred pharmacy is:  [id number] [place] [address]Phone: [phone]: [address]Phone: [phone]: [address]Phone: [phone]: [id number]-[place]Phone: [phone]: [phone]  Uses pill box? Yes Pt endorses 99% compliance  We discussed: Current pharmacy is preferred with insurance plan and patient is satisfied with pharmacy services Patient decided to: Continue current medication management strategy  Care Plan and Follow Up Patient Decision:  Patient agrees to Care Plan and Follow-up.  Plan: Telephone follow up appointment with care management team member scheduled for:  6 months  MJeni Salles PharmD BFunstonPharmacist [place]at [phone]

## 2022-02-10 IMAGING — US US BREAST*L* LIMITED INC AXILLA
2 series · 14 of 14 positions shown · non-contrast
Comparison: Previous exam(s).

CLINICAL DATA: 69-year-old female presenting for short-term
follow-up of a likely benign left breast mass.

EXAM:
ULTRASOUND OF THE LEFT BREAST

[Series 1: us breast*left* limited inc axilla · 0.08mm/px · 7 of 7 slices shown (1 of 2)]
[im 1/7]
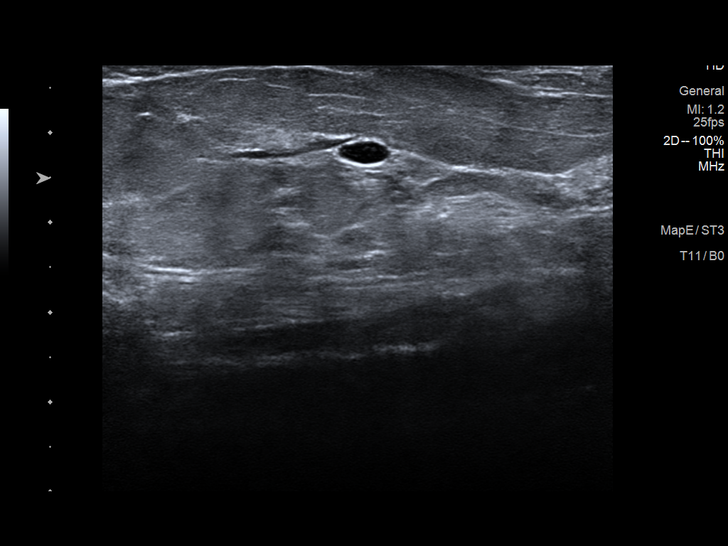
[im 2/7]
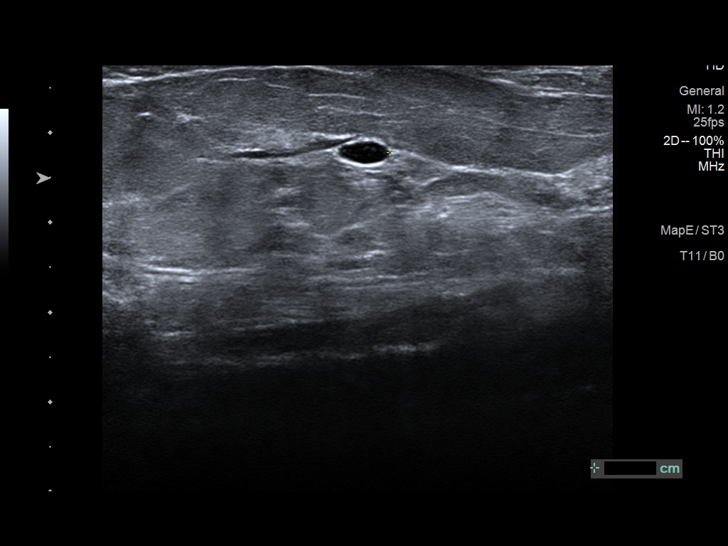
[im 3/7]
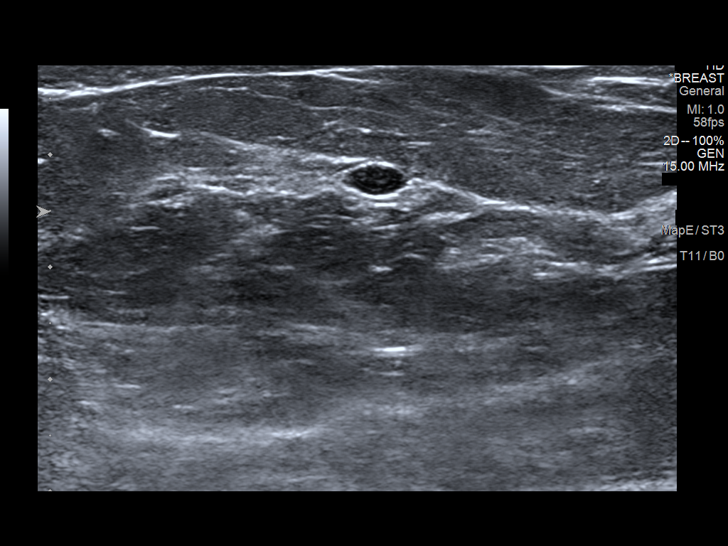
[im 4/7]
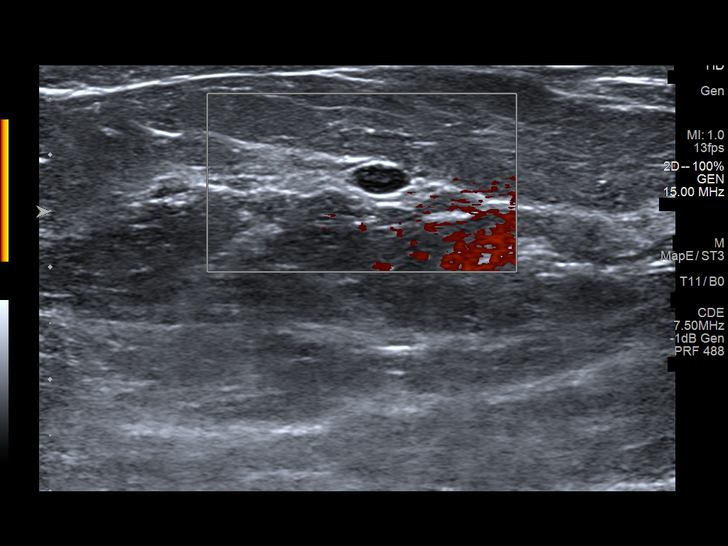
[im 5/7]
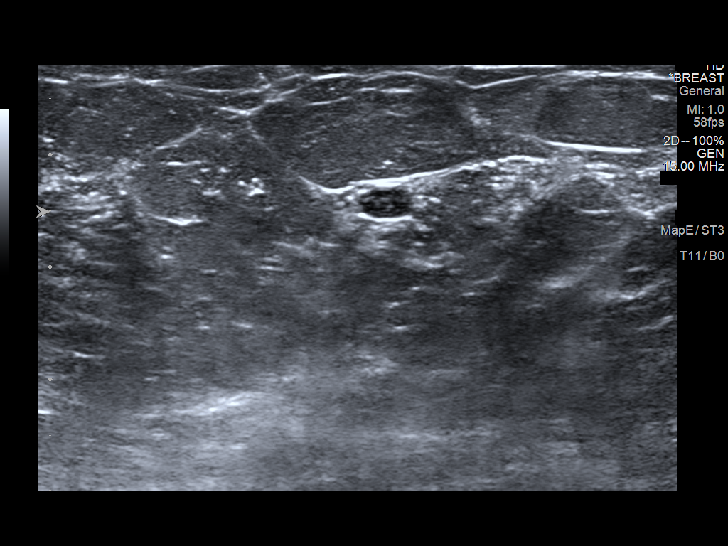
[im 6/7]
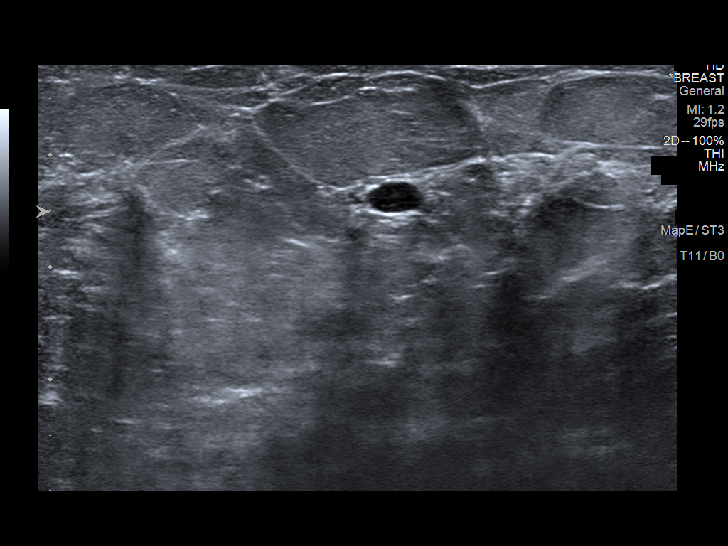
[im 7/7]
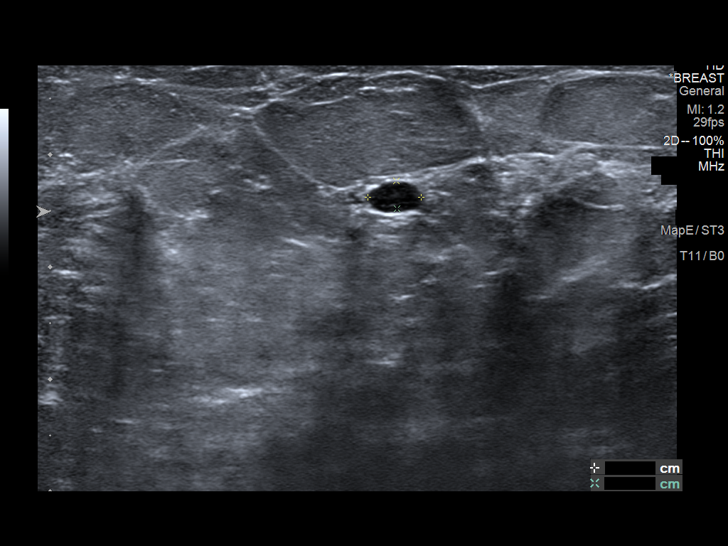

[Series 2: us breast*left* limited inc axilla · 0.07mm/px · 7 of 7 slices shown (2 of 2)]
[im 1/7]
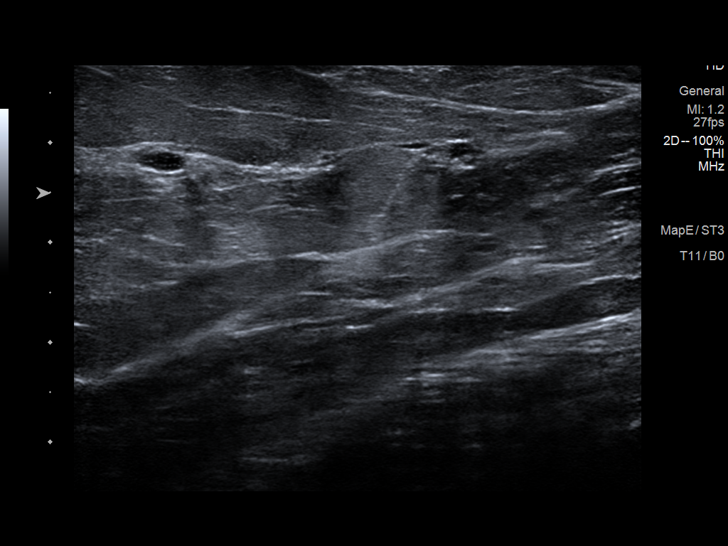
[im 2/7]
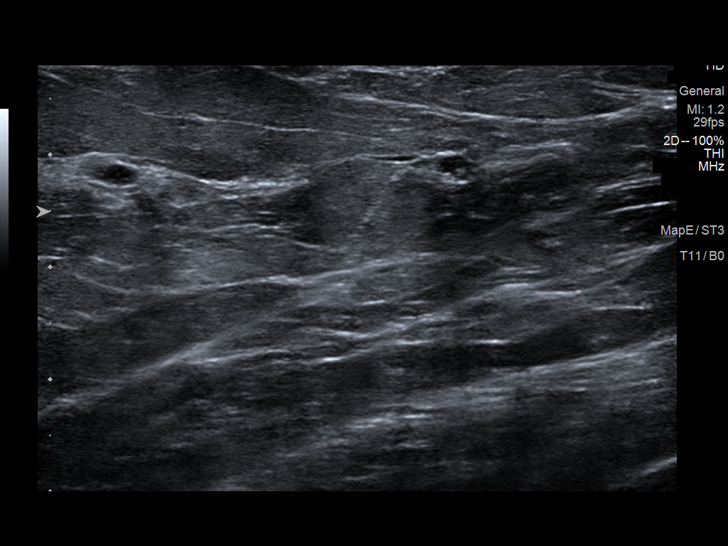
[im 3/7]
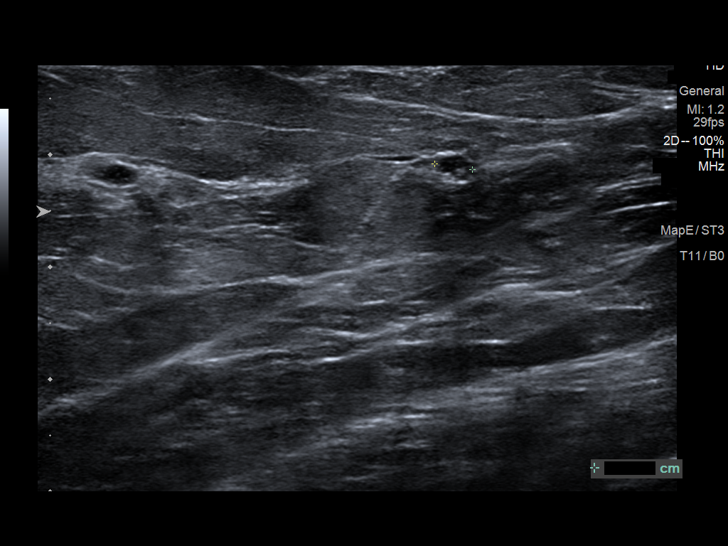
[im 4/7]
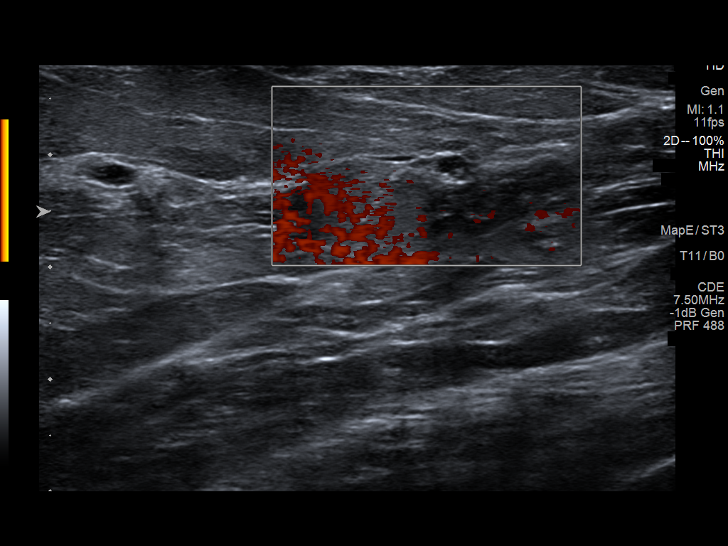
[im 5/7]
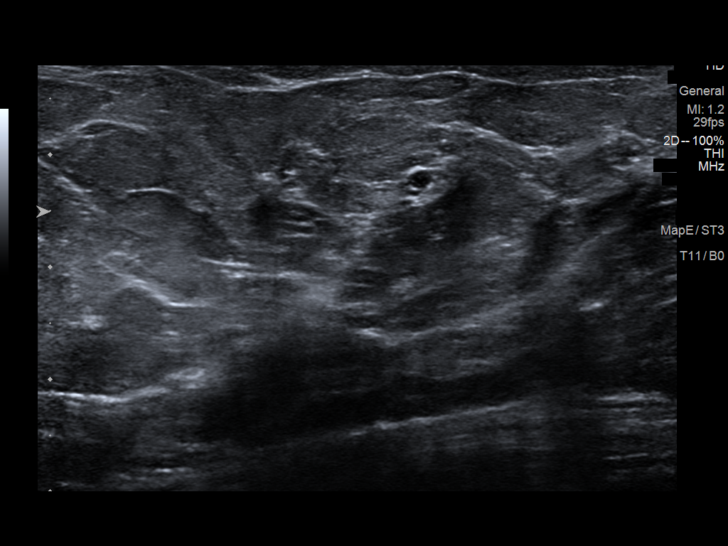
[im 6/7]
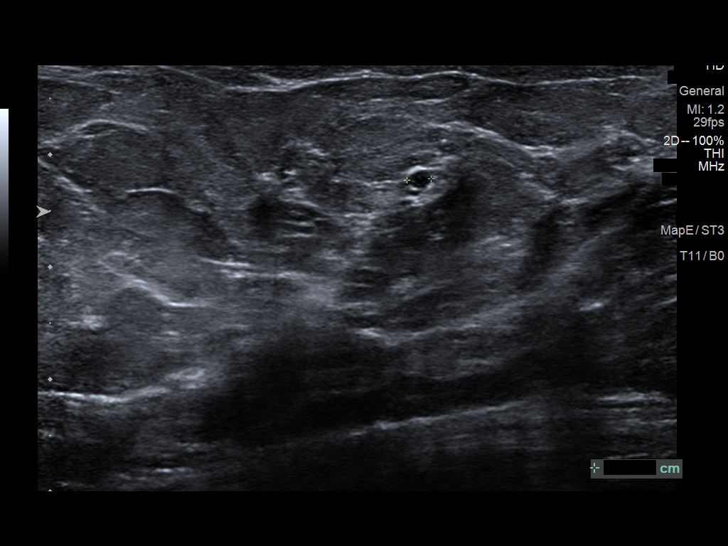
[im 7/7]
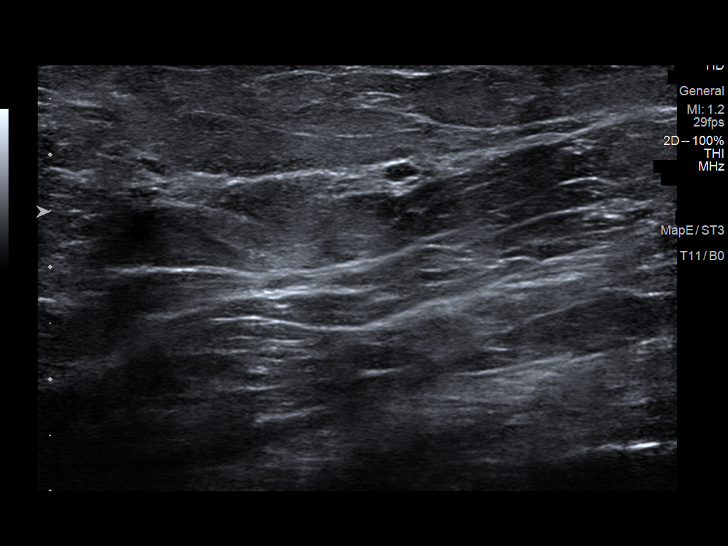

[14 of 14 positions shown; findings below may reference images not displayed]

FINDINGS: The left breast mass at 4 o'clock, 7 cm from the nipple has
decreased in size, now measuring 3 x 1 x 2 mm, previously measuring
7 x 3 x 4 mm.
IMPRESSION: Decrease in size of the left breast mass at 4 o'clock. This is
consistent with a benign cyst.

RECOMMENDATION:
Return to routine screening mammography is recommended. The patient
will be due for screening in Tuesday December, 2020.

I have discussed the findings and recommendations with the patient.
If applicable, a reminder letter will be sent to the patient
regarding the next appointment.

BI-RADS CATEGORY  2: Benign.

## 2022-02-24 ENCOUNTER — Other Ambulatory Visit: Payer: Self-pay | Admitting: Obstetrics and Gynecology

## 2022-02-24 DIAGNOSIS — Z1231 Encounter for screening mammogram for malignant neoplasm of breast: Secondary | ICD-10-CM

## 2022-02-26 ENCOUNTER — Ambulatory Visit
Admission: RE | Admit: 2022-02-26 | Discharge: 2022-02-26 | Disposition: A | Discharge status: Home / Self Care | Payer: Medicare HMO | Source: Ambulatory Visit | Attending: Obstetrics and Gynecology | Admitting: Obstetrics and Gynecology

## 2022-02-26 DIAGNOSIS — Z1231 Encounter for screening mammogram for malignant neoplasm of breast: Secondary | ICD-10-CM | POA: Diagnosis not present

## 2022-02-27 DIAGNOSIS — E039 Hypothyroidism, unspecified: Secondary | ICD-10-CM

## 2022-02-27 DIAGNOSIS — I4891 Unspecified atrial fibrillation: Secondary | ICD-10-CM | POA: Diagnosis not present

## 2022-02-27 DIAGNOSIS — J45909 Unspecified asthma, uncomplicated: Secondary | ICD-10-CM | POA: Diagnosis not present

## 2022-02-27 DIAGNOSIS — E785 Hyperlipidemia, unspecified: Secondary | ICD-10-CM | POA: Diagnosis not present

## 2022-02-27 DIAGNOSIS — I10 Essential (primary) hypertension: Secondary | ICD-10-CM | POA: Diagnosis not present

## 2022-02-27 NOTE — Progress Notes (Signed)
Cardiology Office Note:    Date:  02/28/2022   ID:  BIRDENA KINGMA, DOB Sep 27, 1950, MRN [id number]  PCP:  Burnis Medin, MD  Cardiologist:  [name], MD  Referring MD: Burnis Medin, MD   CC: Follow-up  History of Present Illness:    Amy Stokes is a 71 y.o. female with a hx of paroxysmal atrial fibrillation, hypertension, asthma, OSA, morbid obesity who is seen for follow-up. I initially met her 12/10/2020 as a new consult at the request of Panosh, [name], MD for the evaluation and management of elevated heart rate, concern for atrial fibrillation.  Cardiovascular risk factors: hypertension, OSA, morbid obesity Prior clinical ASCVD: none Comorbid conditions: Endorses hypertension, hyperlipidemia; Denies diabetes, chronic kidney disease  Metabolic syndrome/Obesity: interested in weight loss, discussing GLP1RA with her endocrinologist. Tobacco use history: former Prior pertinent testing and/or incidental findings: echo as below.  Today, She feels afib is well controlled. Rare, brief episodes, self limiting, last time was 12/2020. Her fitbit shows rare spikes in her HR when she is sleeping, but she is asymptomatic.  Brings BP log today. Range in the last 2 months is 80/67(immediate recheck 129/62)-141/61. Most are 120s/60s.  Ankles with minimal chronic edema. Breathing generally stable, using albuterol about once/week.  She denies any palpitations, chest pain, shortness of breath. No lightheadedness, headaches, syncope, orthopnea, or PND.  Past Medical History:  Diagnosis Date   Arthritis    no meds   Asthma    Edema    Fibroids    Hyperlipidemia    Hypertension    Obesity    RML pneumonia 02/25/2021   Sleep apnea    wears cpap    Thyroid nodule 01/2009   left discovered on chest ct; needle bx "indeterminant" cannon r/o follicular neoplasm     Past Surgical History:  Procedure Laterality Date   ABDOMINAL HYSTERECTOMY Bilateral 08/02/2013    Procedure: TOTAL ABDOMINAL HYSTERECTOMY WITH BILATERAL SALPINGO-OOPHORECTOMY ;  Surgeon: Terrance Mass, MD;  Location: [facility];  Service: Gynecology;  Laterality: Bilateral;    Dr. Phineas Real to assist.  Dr. Zella Richer to follow with an umbilical hernia repair. He will need an additional 1 1/2 hours.    COLONOSCOPY  12/27/2007   [facility]    HERNIA REPAIR     MOUTH SURGERY     thyroid needle bx     TONSILLECTOMY     UMBILICAL HERNIA REPAIR N/A 08/02/2013   Procedure: HERNIA REPAIR UMBILICAL ADULT WITH MESH;  Surgeon: Odis Hollingshead, MD;  Location: [facility];  Service: General;  Laterality: N/A;    Current Medications: Current Outpatient Medications on File Prior to Visit  Medication Sig   acetaminophen (TYLENOL) 650 MG CR tablet Take 1,300 mg by mouth every 8 (eight) hours as needed for pain.   albuterol (VENTOLIN HFA) 108 (90 Base) MCG/ACT inhaler Inhale 2 puffs into the lungs every 6 (six) hours as needed for wheezing or shortness of breath.   apixaban (ELIQUIS) 5 MG TABS tablet Take 1 tablet (5 mg total) by mouth 2 (two) times daily.   Budeson-Glycopyrrol-Formoterol (BREZTRI AEROSPHERE) 160-9-4.8 MCG/ACT AERO Inhale 2 puffs into the lungs in the morning and at bedtime. with spacer and rinse mouth afterwards.   Cholecalciferol 50 MCG (2000 UT) CAPS Take 1 capsule by mouth daily. PATIENT USING OIL AND NOT CAPSULES   diltiazem (CARDIZEM CD) 300 MG 24 hr capsule TAKE 1 CAPSULE BY MOUTH EVERY DAY   fexofenadine (ALLEGRA) 180 MG tablet TAKE 1  TABLET BY MOUTH EVERY DAY   flecainide (TAMBOCOR) 50 MG tablet TAKE 1 TABLET BY MOUTH TWICE A DAY   hydrochlorothiazide (HYDRODIURIL) 25 MG tablet TAKE 1 TABLET (25 MG TOTAL) BY MOUTH DAILY.   ipratropium-albuterol (DUONEB) 0.5-2.5 (3) MG/3ML SOLN Take 3 mLs by nebulization every 6 (six) hours as needed.   levothyroxine (SYNTHROID) 100 MCG tablet TAKE 1 TABLET BY MOUTH EVERY MORNING ON AN EMPTY STOMACH Orally Once a day 90 days   losartan (COZAAR) 100 MG  tablet TAKE 1 TABLET BY MOUTH EVERY DAY   Lutein 10 MG TABS Take 10 mg by mouth daily.   montelukast (SINGULAIR) 10 MG tablet Take 1 tablet (10 mg total) by mouth at bedtime.   MULTIPLE VITAMIN PO Take 1 tablet by mouth daily.   Multiple Vitamins-Minerals (PRESERVISION AREDS 2 PO) Take 1 tablet by mouth in the morning and at bedtime.   pantoprazole (PROTONIX) 40 MG tablet TAKE 1 TABLET BY MOUTH EVERY DAY   potassium chloride (KLOR-CON M10) 10 MEQ tablet Take 1 tablet (10 mEq total) by mouth 3 (three) times daily.   rosuvastatin (CRESTOR) 10 MG tablet Take 1 tablet (10 mg total) by mouth daily.   Spacer/Aero-Holding Chambers DEVI Use with spacer   valACYclovir (VALTREX) 500 MG tablet As needed   No current facility-administered medications on file prior to visit.     Allergies:   Patient has no known allergies.   Social History   Tobacco Use   Smoking status: Former    Packs/day: 1.00    Years: 1.00    Total pack years: 1.00    Types: Cigarettes    Quit date: 04/29/1971    Years since quitting: 50.8    Passive exposure: Never   Smokeless tobacco: Never  Vaping Use   Vaping Use: Never used  Substance Use Topics   Alcohol use: Not Currently    Comment: occasionally   Drug use: No    Family History: family history includes Arthritis in her mother; Heart disease in her brother and father; Other in her mother; Pancreatic cancer in her mother; Thyroid nodules in her sister. There is no history of Colon cancer, Colon polyps, Esophageal cancer, Rectal cancer, or Stomach cancer.  ROS:   Please see the history of present illness.  All other systems are reviewed and negative.    EKGs/Labs/Other Studies Reviewed:    The following studies were reviewed today:  Echo 11/01/20 1. Left ventricular ejection fraction, by estimation, is 55 to 60%. The  left ventricle has normal function. The left ventricle has no regional  wall motion abnormalities. Left ventricular diastolic parameters  are  indeterminate.   2. Right ventricular systolic function is normal. The right ventricular  size is mildly enlarged. Tricuspid regurgitation signal is inadequate for assessing PA pressure.   3. The mitral valve is grossly normal. No evidence of mitral valve  regurgitation. No evidence of mitral stenosis.   4. The aortic valve is grossly normal. Aortic valve regurgitation is not  visualized. No aortic stenosis is present.   5. The inferior vena cava is dilated in size with >50% respiratory  variability, suggesting right atrial pressure of 8 mmHg.   Comparison(s): No significant change from prior study.   EKG:  EKG is personally reviewed.   02/28/2022: not ordered today 02/18/2021: NSR at 71 bpm 12/10/20: Atrial fibrillation with RVR at 101 bpm  Recent Labs: 11/07/2021: ALT 12 11/29/2021: BUN 17; Creatinine, Ser 0.80; Potassium 3.7; Sodium 138 12/25/2021: Hemoglobin 12.5;  Platelets 248.0   Recent Lipid Panel    Component Value Date/Time   CHOL 134 11/07/2021 0814   TRIG 42 11/07/2021 0814   HDL 48 11/07/2021 0814   CHOLHDL 2.8 11/07/2021 0814   CHOLHDL 4 07/02/2020 0805   VLDL 19.0 07/02/2020 0805   LDLCALC 76 11/07/2021 0814   LDLDIRECT 112 (H) 01/10/2021 1016   LDLDIRECT 141.7 04/03/2011 1043    Physical Exam:    VS:  BP 109/61 Comment: home  Pulse 71   Ht 5' 8" (1.727 m)   Wt 286 lb 12.8 oz (130.1 kg)   SpO2 90%   BMI 43.61 kg/m     Wt Readings from Last 3 Encounters:  02/28/22 286 lb 12.8 oz (130.1 kg)  02/05/22 288 lb (130.6 kg)  01/27/22 288 lb 9.6 oz (130.9 kg)    GEN: Well nourished, well developed in no acute distress HEENT: Normal, moist mucous membranes NECK: No JVD CARDIAC: regular rhythm on exam, normal S1 and S2, no rubs or gallops. 1/6 systolic murmur. VASCULAR: Radial and DP pulses 2+ bilaterally. No carotid bruits RESPIRATORY:  Clear to auscultation without rales, wheezing or rhonchi  ABDOMEN: Soft, non-tender, non-distended MUSCULOSKELETAL:   Ambulates independently SKIN: Warm and dry, chronic venous stasis discoloration.  NEUROLOGIC:  Alert and oriented x 3. No focal neuro deficits noted. PSYCHIATRIC:  Normal affect    ASSESSMENT:    1. Essential hypertension   2. PAF (paroxysmal atrial fibrillation) ([place])   3. Secondary hypercoagulable state ([place])   4. Mixed hyperlipidemia   5. OSA (obstructive sleep apnea)   6. Morbid obesity ([place])    PLAN:    Paroxysmal atrial fibrillation: -in NSR today -CHA2DS2/VAS Stroke Risk Points = 3. Continue apixaban -on flecainide, follows with Dr. Curt Bears. -on diltiazem as well  Hypertension: home numbers well controlled -continue HCTZ, losartan.  -on diltiazem as well  Hyperlipidemia:  -tolerating rosuvastatin, last LDL 76  OSA: continue CPAP  Morbid obesity: would be a good candidate for GLP1RA for weight loss if covered by medicare. BMI 43.    Cardiac risk counseling and prevention recommendations: -recommend heart healthy/Mediterranean diet, with whole grains, fruits, vegetable, fish, lean meats, nuts, and olive oil. Limit salt. -recommend moderate walking, 3-5 times/week for 30-50 minutes each session. Aim for at least 150 minutes.week. Goal should be pace of 3 miles/hours, or walking 1.5 miles in 30 minutes -recommend avoidance of tobacco products. Avoid excess alcohol. -ASCVD risk score: The 10-year ASCVD risk score (Arnett DK, et al., 2019) is: 8.5%   Values used to calculate the score:     Age: 89 years     Sex: Female     Is Non-Hispanic African American: No     Diabetic: No     Tobacco smoker: No     Systolic Blood Pressure: 671 mmHg     Is BP treated: Yes     HDL Cholesterol: 48 mg/dL     Total Cholesterol: 134 mg/dL    Plan for follow up: 6 months or sooner as needed  [name], MD, PhD, [facility]    Medication Adjustments/Labs and Tests Ordered: Current medicines are reviewed at length with the patient today.  Concerns  regarding medicines are outlined above.   No orders of the defined types were placed in this encounter.  No orders of the defined types were placed in this encounter.  Patient Instructions  Medication Instructions:  NO CHANGES  *If you need a refill on your  cardiac medications before your next appointment, please call your pharmacy*   Follow-Up: At [facility], you and your health needs are our priority.  As part of our continuing mission to provide you with exceptional heart care, we have created designated Provider Care Teams.  These Care Teams include your primary Cardiologist (physician) and Advanced Practice Providers (APPs -  Physician Assistants and Nurse Practitioners) who all work together to provide you with the care you need, when you need it.  We recommend signing up for the patient portal called "MyChart".  Sign up information is provided on this After Visit Summary.  MyChart is used to connect with patients for Virtual Visits (Telemedicine).  Patients are able to view lab/test results, encounter notes, upcoming appointments, etc.  Non-urgent messages can be sent to your provider as well.   To learn more about what you can do with MyChart, go to [email or web address].    Your next appointment:   6 month(s)  The format for your next appointment:   In Person  Provider:   [name], MD    Signed, [name], MD PhD 02/28/2022 5:56 PM    [place]

## 2022-02-28 ENCOUNTER — Ambulatory Visit (HOSPITAL_BASED_OUTPATIENT_CLINIC_OR_DEPARTMENT_OTHER): Payer: Medicare HMO | Admitting: Cardiology

## 2022-02-28 ENCOUNTER — Other Ambulatory Visit: Payer: Self-pay | Admitting: Obstetrics and Gynecology

## 2022-02-28 ENCOUNTER — Encounter (HOSPITAL_BASED_OUTPATIENT_CLINIC_OR_DEPARTMENT_OTHER): Payer: Self-pay | Admitting: Cardiology

## 2022-02-28 VITALS — BP 109/61 | HR 71 | Ht 68.0 in | Wt 286.8 lb

## 2022-02-28 DIAGNOSIS — D6869 Other thrombophilia: Secondary | ICD-10-CM

## 2022-02-28 DIAGNOSIS — I480 Paroxysmal atrial fibrillation: Secondary | ICD-10-CM

## 2022-02-28 DIAGNOSIS — G4733 Obstructive sleep apnea (adult) (pediatric): Secondary | ICD-10-CM

## 2022-02-28 DIAGNOSIS — I10 Essential (primary) hypertension: Secondary | ICD-10-CM | POA: Diagnosis not present

## 2022-02-28 DIAGNOSIS — R928 Other abnormal and inconclusive findings on diagnostic imaging of breast: Secondary | ICD-10-CM

## 2022-02-28 DIAGNOSIS — E782 Mixed hyperlipidemia: Secondary | ICD-10-CM

## 2022-02-28 NOTE — Patient Instructions (Signed)
Medication Instructions:  NO CHANGES  *If you need a refill on your cardiac medications before your next appointment, please call your pharmacy*   Follow-Up: At [facility], you and your health needs are our priority.  As part of our continuing mission to provide you with exceptional heart care, we have created designated Provider Care Teams.  These Care Teams include your primary Cardiologist (physician) and Advanced Practice Providers (APPs -  Physician Assistants and Nurse Practitioners) who all work together to provide you with the care you need, when you need it.  We recommend signing up for the patient portal called "MyChart".  Sign up information is provided on this After Visit Summary.  MyChart is used to connect with patients for Virtual Visits (Telemedicine).  Patients are able to view lab/test results, encounter notes, upcoming appointments, etc.  Non-urgent messages can be sent to your provider as well.   To learn more about what you can do with MyChart, go to [email or web address].    Your next appointment:   6 month(s)  The format for your next appointment:   In Person  Provider:   [name], MD

## 2022-03-04 DIAGNOSIS — Z9989 Dependence on other enabling machines and devices: Secondary | ICD-10-CM | POA: Diagnosis not present

## 2022-03-04 DIAGNOSIS — G4733 Obstructive sleep apnea (adult) (pediatric): Secondary | ICD-10-CM | POA: Diagnosis not present

## 2022-03-12 ENCOUNTER — Other Ambulatory Visit: Payer: Self-pay

## 2022-03-12 MED ORDER — POTASSIUM CHLORIDE CRYS ER 10 MEQ PO TBCR: 10.0000 meq | EXTENDED_RELEASE_TABLET | Freq: Three times a day (TID) | ORAL | 0 refills | Status: DC

## 2022-03-18 ENCOUNTER — Ambulatory Visit: Payer: Medicare HMO

## 2022-03-18 ENCOUNTER — Ambulatory Visit
Admission: RE | Admit: 2022-03-18 | Discharge: 2022-03-18 | Disposition: A | Discharge status: Home / Self Care | Payer: Medicare HMO | Source: Ambulatory Visit | Attending: Obstetrics and Gynecology | Admitting: Obstetrics and Gynecology

## 2022-03-18 DIAGNOSIS — N6489 Other specified disorders of breast: Secondary | ICD-10-CM | POA: Diagnosis not present

## 2022-03-18 DIAGNOSIS — R928 Other abnormal and inconclusive findings on diagnostic imaging of breast: Secondary | ICD-10-CM | POA: Diagnosis not present

## 2022-03-22 ENCOUNTER — Other Ambulatory Visit (HOSPITAL_BASED_OUTPATIENT_CLINIC_OR_DEPARTMENT_OTHER): Payer: Self-pay | Admitting: Cardiology

## 2022-03-22 DIAGNOSIS — I480 Paroxysmal atrial fibrillation: Secondary | ICD-10-CM

## 2022-03-25 DIAGNOSIS — Z01419 Encounter for gynecological examination (general) (routine) without abnormal findings: Secondary | ICD-10-CM | POA: Diagnosis not present

## 2022-03-25 DIAGNOSIS — R32 Unspecified urinary incontinence: Secondary | ICD-10-CM | POA: Diagnosis not present

## 2022-03-25 NOTE — Telephone Encounter (Signed)
Rx(s) sent to pharmacy electronically.  

## 2022-04-03 ENCOUNTER — Other Ambulatory Visit: Payer: Self-pay

## 2022-04-03 ENCOUNTER — Ambulatory Visit: Payer: Medicare HMO | Attending: Obstetrics and Gynecology | Admitting: Physical Therapy

## 2022-04-03 ENCOUNTER — Encounter: Payer: Self-pay | Admitting: Physical Therapy

## 2022-04-03 DIAGNOSIS — R279 Unspecified lack of coordination: Secondary | ICD-10-CM | POA: Diagnosis not present

## 2022-04-03 DIAGNOSIS — R293 Abnormal posture: Secondary | ICD-10-CM | POA: Diagnosis not present

## 2022-04-03 DIAGNOSIS — M6281 Muscle weakness (generalized): Secondary | ICD-10-CM | POA: Insufficient documentation

## 2022-04-03 DIAGNOSIS — N3946 Mixed incontinence: Secondary | ICD-10-CM | POA: Diagnosis not present

## 2022-04-03 NOTE — Therapy (Signed)
OUTPATIENT PHYSICAL THERAPY FEMALE PELVIC EVALUATION   Patient Name: Amy Stokes MRN: [id number] DOB:[birth year], 72 y.o., female Today's Date: 04/03/2022  END OF SESSION:  PT End of Session - 04/03/22 1025     Visit Number 1    Date for PT Re-Evaluation 06/26/22    Authorization Type [employer] medicare    PT Start Time 1010    PT Stop Time 1045    PT Time Calculation (min) 35 min    Activity Tolerance Patient tolerated treatment well    Behavior During Therapy [facility] for tasks assessed/performed             Past Medical History:  Diagnosis Date   Arthritis    no meds   Asthma    Edema    Fibroids    Hyperlipidemia    Hypertension    Obesity    RML pneumonia 02/25/2021   Sleep apnea    wears cpap    Thyroid nodule 01/2009   left discovered on chest ct; needle bx "indeterminant" cannon r/o follicular neoplasm    Past Surgical History:  Procedure Laterality Date   ABDOMINAL HYSTERECTOMY Bilateral 08/02/2013   Procedure: TOTAL ABDOMINAL HYSTERECTOMY WITH BILATERAL SALPINGO-OOPHORECTOMY ;  Surgeon: Terrance Mass, MD;  Location: [facility];  Service: Gynecology;  Laterality: Bilateral;    Dr. Phineas Real to assist.  Dr. Zella Richer to follow with an umbilical hernia repair. He will need an additional 1 1/2 hours.    COLONOSCOPY  12/27/2007   [facility]    HERNIA REPAIR     MOUTH SURGERY     thyroid needle bx     TONSILLECTOMY     UMBILICAL HERNIA REPAIR N/A 08/02/2013   Procedure: HERNIA REPAIR UMBILICAL ADULT WITH MESH;  Surgeon: Odis Hollingshead, MD;  Location: [facility];  Service: General;  Laterality: N/A;   Patient Active Problem List   Diagnosis Date Noted   Not well controlled severe persistent asthma 01/08/2022   Gastroesophageal reflux disease 01/08/2022   Other allergic rhinitis 07/29/2021   History of eczema as a child 07/29/2021   Elevated IgE level 07/05/2021   Upper airway cough syndrome 06/04/2021   Bilateral lower extremity edema 06/04/2021   Atrial  fibrillation, chronic ([place]) 02/27/2021   Hypothyroidism 02/27/2021   Hypokalemia 02/27/2021   Cough 08/09/2015   Iliotibial band syndrome affecting left lower leg 05/26/2014   Arthritis of left lower extremity 05/26/2014   Low HDL (under 40) 04/08/2014   Thyroid nodule 02/12/2014   OSA (obstructive sleep apnea) 62/69/4854   Seroma complicating a procedure 08/18/2013   Bleeding gums 06/09/2011   ALLERGY 01/31/2010   MORBID OBESITY 07/16/2009   Multinodular goiter (nontoxic) 01/13/2008   RECTAL BLEEDING 11/22/2007   UNS ADVRS EFF UNS RX MEDICINAL&BIOLOGICAL SBSTNC 07/26/2007   HYPERLIPIDEMIA 04/06/2007   Essential hypertension 03/05/2007   TACHYCARDIA 03/05/2007   Not well controlled moderate persistent asthma 12/28/2006    PCP: Burnis Medin, MD   REFERRING PROVIDER: [name], MD   REFERRING DIAG: N39.46 (ICD-10-CM) - Mixed incontinence   THERAPY DIAG:  Unspecified lack of coordination  Muscle weakness (generalized)  Abnormal posture  Rationale for Evaluation and Treatment: Rehabilitation  ONSET DATE: over a year  SUBJECTIVE:  SUBJECTIVE STATEMENT: I have some control of it but whenever I have to go I cannot wait.  I have nocturia 2x/night.  I have sat too long and then that will cause leakage.  Pt does report some back pain when standing to meal prep/clean counter. Fluid intake: Yes: mostly water up to 40 oz    PAIN:  Are you having pain? No   PRECAUTIONS: None  WEIGHT BEARING RESTRICTIONS: No  FALLS:  Has patient fallen in last 6 months? No  LIVING ENVIRONMENT: Lives with: lives with their spouse Lives in: House/apartment   OCCUPATION: No retired,  Hobbies: workout with weights at [employer] [place]: be able to not leak  PERTINENT  HISTORY:  Arthritis, hysterectomy (total abdominal with bil ovary removed) Sexual abuse: No  BOWEL MOVEMENT: Pain with bowel movement: No Type of bowel movement:Type (Bristol Stool Scale) normal, Frequency normal, and Strain No Fully empty rectum: Yes:   Leakage: No Pads: No Fiber supplement: Yes: psyllium  URINATION: Pain with urination: No Fully empty bladder: Yes:   Stream:  doesn't match the urgency Urgency: Yes:   Frequency: every few hours, 2 nocturia Leakage: Urge to void, cough/sneeze just a little Pads: Yes: 1/day  INTERCOURSE: Pain with intercourse: Not sexually active  PREGNANCY: No  PROLAPSE:    OBJECTIVE:   DIAGNOSTIC FINDINGS:    PATIENT SURVEYS:    PFIQ-7   COGNITION: Overall cognitive status: Within functional limits for tasks assessed     SENSATION:  MUSCLE LENGTH: Hamstrings: Right 80 deg; Left 80 deg Thomas test:   LUMBAR SPECIAL TESTS:    FUNCTIONAL TESTS:  Single leg stand - adduction and IR on left hip  GAIT: Comments: WFL   POSTURE: rounded shoulders, forward head, increased lumbar lordosis, and anterior pelvic tilt  PELVIC ALIGNMENT: normal  LUMBARAROM/PROM:  A/PROM A/PROM  eval  Flexion WFL   Extension   Right lateral flexion   Left lateral flexion 75%  Right rotation 75%  Left rotation    (Blank rows = not tested)  LOWER EXTREMITY ROM:  Passive ROM Right eval Left eval  Hip flexion    Hip extension    Hip abduction    Hip adduction    Hip internal rotation    Hip external rotation 80% WFL   Knee flexion    Knee extension    Ankle dorsiflexion    Ankle plantarflexion    Ankle inversion    Ankle eversion     (Blank rows = not tested)  LOWER EXTREMITY MMT:  MMT Right eval Left eval  Hip flexion 5 5  Hip extension 5 5  Hip abduction 5 4/5  Hip adduction 5 5  Hip internal rotation 5 5  Hip external rotation 5 4/5  Knee flexion    Knee extension    Ankle dorsiflexion    Ankle plantarflexion     Ankle inversion    Ankle eversion     PALPATION:   General  lumbar and thoracic paraspinals and gluteals tight                External Perineal Exam normal tone no tenderness                             Internal Pelvic Floor levators high tone no tenderness, some fascial restriction around   Patient confirms identification and approves PT to assess internal pelvic floor and treatment Yes  PELVIC  MMT:   MMT eval  Vaginal 3/5 holding 12 sec; 1 rep then was 2/5 MMT  Internal Anal Sphincter   External Anal Sphincter   Puborectalis   Diastasis Recti   (Blank rows = not tested)        TONE: high  PROLAPSE: no  TODAY'S TREATMENT:                                                                                                                              DATE: 04/03/22                    EVAL and initial HEP with urgency and bladder health   PATIENT EDUCATION:  Education details: Access Code: [email or web address] and urge techniques Person educated: Patient Education method: Explanation, Demonstration, Tactile cues, Verbal cues, and Handouts Education comprehension: verbalized understanding and returned demonstration  HOME EXERCISE PROGRAM: Access Code: [email or web address] URL: [email or web address] Date: 04/03/2022 Prepared by: Jari Favre  Exercises - Supine Hip Internal and External Rotation  - 1 x daily - 7 x weekly - 1 sets - 10 reps - 5 sec hold - Supine Figure 4 Piriformis Stretch  - 1 x daily - 7 x weekly - 1 sets - 3 reps - 30 sec hold - Standing 'L' Stretch at Counter  - 1 x daily - 7 x weekly - 3 sets - 10 reps - Seated Hamstring Stretch  - 1 x daily - 7 x weekly - 1 sets - 3 reps - 30 sec hold  ASSESSMENT:  CLINICAL IMPRESSION: Patient is a 72 y.o. female who was seen today for physical therapy evaluation and treatment for mixed incontinence.  Pt has high tone pelvic floor with some reduced endurance.  She is 3/5 MMT for 12 second hold and can do 1 rep  before having weak squeeze.  Pt has tension in her back and some abdominal weakness with tenting when doing MMT of the abdominals.  Pt also using breath holding to stabilize the trunk and when doing a kegel.  Pt will benefit from skilled PT to address these impairments so she can return to maximum functional tasks without excessive urgency and leakage.   OBJECTIVE IMPAIRMENTS: decreased coordination, decreased endurance, decreased ROM, decreased strength, increased muscle spasms, impaired flexibility, impaired tone, postural dysfunction, and pain.   ACTIVITY LIMITATIONS: standing and continence  PARTICIPATION LIMITATIONS: meal prep, cleaning, and community activity  PERSONAL FACTORS: 1-2 comorbidities: PMH: menopause, hysterectomy  are also affecting patient's functional outcome.   REHAB POTENTIAL: Excellent  CLINICAL DECISION MAKING: Stable/uncomplicated  EVALUATION COMPLEXITY: Low   GOALS: Goals reviewed with patient? Yes  SHORT TERM GOALS: Target date: 05/01/22  Ind with initial HEP Baseline: Goal status: INITIAL  2.  Ind with urge techniques and drinking more than 40 oz of water Baseline:  Goal status: INITIAL   LONG TERM GOALS: Target date: 06/26/2022  Pt will report she can stand for at least 30-45 minutes at  a time without increased back pain Baseline:  Goal status: INITIAL  2.  Pt will report 50% less urgency due to improved pelvic floor strength during functional movement Baseline:  Goal status: INITIAL  3.  Pt will have nocturia of 1 x max per night Baseline:  Goal status: INITIAL  4.  Pt will be independent with advanced HEP to maintain improvements made throughout therapy  Baseline:  Goal status: INITIAL    PLAN:  PT FREQUENCY: 1x/week  PT DURATION: 12 weeks  PLANNED INTERVENTIONS: Therapeutic exercises, Therapeutic activity, Neuromuscular re-education, Balance training, Gait training, Patient/Family education, Self Care, Joint mobilization, Aquatic  Therapy, Dry Needling, Electrical stimulation, Cryotherapy, Moist heat, Taping, Traction, Biofeedback, Manual therapy, and Re-evaluation  PLAN FOR NEXT SESSION: work on core strength and coordination of breathing and pelvic floor isolation   [employer], PT 04/03/2022, 12:31 PM

## 2022-04-04 DIAGNOSIS — Z9989 Dependence on other enabling machines and devices: Secondary | ICD-10-CM | POA: Diagnosis not present

## 2022-04-04 DIAGNOSIS — G4733 Obstructive sleep apnea (adult) (pediatric): Secondary | ICD-10-CM | POA: Diagnosis not present

## 2022-04-10 ENCOUNTER — Ambulatory Visit: Payer: Medicare HMO | Admitting: Physical Therapy

## 2022-04-10 ENCOUNTER — Encounter: Payer: Self-pay | Admitting: Physical Therapy

## 2022-04-10 DIAGNOSIS — N3946 Mixed incontinence: Secondary | ICD-10-CM | POA: Diagnosis not present

## 2022-04-10 DIAGNOSIS — R279 Unspecified lack of coordination: Secondary | ICD-10-CM

## 2022-04-10 DIAGNOSIS — M6281 Muscle weakness (generalized): Secondary | ICD-10-CM | POA: Diagnosis not present

## 2022-04-10 DIAGNOSIS — R293 Abnormal posture: Secondary | ICD-10-CM

## 2022-04-10 NOTE — Therapy (Signed)
OUTPATIENT PHYSICAL THERAPY FEMALE PELVIC TREATMENT   Patient Name: Amy Stokes MRN: [id number] DOB:1950/11/14, 72 y.o., female Today's Date: 04/10/2022  END OF SESSION:  PT End of Session - 04/10/22 1232     Visit Number 2    Date for PT Re-Evaluation 06/26/22    Authorization Type [employer] medicare    PT Start Time 1148    PT Stop Time 1228    PT Time Calculation (min) 40 min    Activity Tolerance Patient tolerated treatment well    Behavior During Therapy [facility] for tasks assessed/performed              Past Medical History:  Diagnosis Date   Arthritis    no meds   Asthma    Edema    Fibroids    Hyperlipidemia    Hypertension    Obesity    RML pneumonia 02/25/2021   Sleep apnea    wears cpap    Thyroid nodule 01/2009   left discovered on chest ct; needle bx "indeterminant" cannon r/o follicular neoplasm    Past Surgical History:  Procedure Laterality Date   ABDOMINAL HYSTERECTOMY Bilateral 08/02/2013   Procedure: TOTAL ABDOMINAL HYSTERECTOMY WITH BILATERAL SALPINGO-OOPHORECTOMY ;  Surgeon: Terrance Mass, MD;  Location: [facility];  Service: Gynecology;  Laterality: Bilateral;    Dr. Phineas Real to assist.  Dr. Zella Richer to follow with an umbilical hernia repair. He will need an additional 1 1/2 hours.    COLONOSCOPY  12/27/2007   [facility]    HERNIA REPAIR     MOUTH SURGERY     thyroid needle bx     TONSILLECTOMY     UMBILICAL HERNIA REPAIR N/A 08/02/2013   Procedure: HERNIA REPAIR UMBILICAL ADULT WITH MESH;  Surgeon: Odis Hollingshead, MD;  Location: [facility];  Service: General;  Laterality: N/A;   Patient Active Problem List   Diagnosis Date Noted   Not well controlled severe persistent asthma 01/08/2022   Gastroesophageal reflux disease 01/08/2022   Other allergic rhinitis 07/29/2021   History of eczema as a child 07/29/2021   Elevated IgE level 07/05/2021   Upper airway cough syndrome 06/04/2021   Bilateral lower extremity edema 06/04/2021   Atrial  fibrillation, chronic ([place]) 02/27/2021   Hypothyroidism 02/27/2021   Hypokalemia 02/27/2021   Cough 08/09/2015   Iliotibial band syndrome affecting left lower leg 05/26/2014   Arthritis of left lower extremity 05/26/2014   Low HDL (under 40) 04/08/2014   Thyroid nodule 02/12/2014   OSA (obstructive sleep apnea) 69/62/9528   Seroma complicating a procedure 08/18/2013   Bleeding gums 06/09/2011   ALLERGY 01/31/2010   MORBID OBESITY 07/16/2009   Multinodular goiter (nontoxic) 01/13/2008   RECTAL BLEEDING 11/22/2007   UNS ADVRS EFF UNS RX MEDICINAL&BIOLOGICAL SBSTNC 07/26/2007   HYPERLIPIDEMIA 04/06/2007   Essential hypertension 03/05/2007   TACHYCARDIA 03/05/2007   Not well controlled moderate persistent asthma 12/28/2006    PCP: Burnis Medin, MD   REFERRING PROVIDER: [name], MD   REFERRING DIAG: N39.46 (ICD-10-CM) - Mixed incontinence   THERAPY DIAG:  Unspecified lack of coordination  Muscle weakness (generalized)  Abnormal posture  Rationale for Evaluation and Treatment: Rehabilitation  ONSET DATE: over a year  SUBJECTIVE:  SUBJECTIVE STATEMENT: No changes since eval, but urge is less using urgency training. Fluid intake: Yes: mostly water up to 40 oz    PAIN:  Are you having pain? No   PRECAUTIONS: None  WEIGHT BEARING RESTRICTIONS: No  FALLS:  Has patient fallen in last 6 months? No  LIVING ENVIRONMENT: Lives with: lives with their spouse Lives in: House/apartment   OCCUPATION: No retired,  Hobbies: workout with weights at [place]: be able to not leak  PERTINENT HISTORY:  Arthritis, hysterectomy (total abdominal with bil ovary removed) Sexual abuse: No  BOWEL MOVEMENT: Pain with bowel movement: No Type of bowel  movement:Type (Bristol Stool Scale) normal, Frequency normal, and Strain No Fully empty rectum: Yes:   Leakage: No Pads: No Fiber supplement: Yes: psyllium  URINATION: Pain with urination: No Fully empty bladder: Yes:   Stream:  doesn't match the urgency Urgency: Yes:   Frequency: every few hours, 2 nocturia Leakage: Urge to void, cough/sneeze just a little Pads: Yes: 1/day  INTERCOURSE: Pain with intercourse: Not sexually active  PREGNANCY: No  PROLAPSE:    OBJECTIVE:   DIAGNOSTIC FINDINGS:    PATIENT SURVEYS:    PFIQ-7   COGNITION: Overall cognitive status: Within functional limits for tasks assessed     SENSATION:  MUSCLE LENGTH: Hamstrings: Right 80 deg; Left 80 deg Thomas test:   LUMBAR SPECIAL TESTS:    FUNCTIONAL TESTS:  Single leg stand - adduction and IR on left hip  GAIT: Comments: WFL   POSTURE: rounded shoulders, forward head, increased lumbar lordosis, and anterior pelvic tilt  PELVIC ALIGNMENT: normal  LUMBARAROM/PROM:  A/PROM A/PROM  eval  Flexion WFL   Extension   Right lateral flexion   Left lateral flexion 75%  Right rotation 75%  Left rotation    (Blank rows = not tested)  LOWER EXTREMITY ROM:  Passive ROM Right eval Left eval  Hip flexion    Hip extension    Hip abduction    Hip adduction    Hip internal rotation    Hip external rotation 80% WFL   Knee flexion    Knee extension    Ankle dorsiflexion    Ankle plantarflexion    Ankle inversion    Ankle eversion     (Blank rows = not tested)  LOWER EXTREMITY MMT:  MMT Right eval Left eval  Hip flexion 5 5  Hip extension 5 5  Hip abduction 5 4/5  Hip adduction 5 5  Hip internal rotation 5 5  Hip external rotation 5 4/5  Knee flexion    Knee extension    Ankle dorsiflexion    Ankle plantarflexion    Ankle inversion    Ankle eversion     PALPATION:   General  lumbar and thoracic paraspinals and gluteals tight                External Perineal  Exam normal tone no tenderness                             Internal Pelvic Floor levators high tone no tenderness, some fascial restriction around   Patient confirms identification and approves PT to assess internal pelvic floor and treatment Yes  PELVIC MMT:   MMT eval  Vaginal 3/5 holding 12 sec; 1 rep then was 2/5 MMT  Internal Anal Sphincter   External Anal Sphincter   Puborectalis   Diastasis Recti   (  Blank rows = not tested)        TONE: high  PROLAPSE: no  TODAY'S TREATMENT:                                                                                                                              DATE: 04/10/22                    Exercises: Clam and reverse clam 10x each Supine ball press Supine ball squeeze 3 reps then stopped due to too much tone in pelvic floor Seated kegel with exhale Seated lumbar flexion with ball roll Seated piriformis stretch Lean on table kegel with exhale   DATE: 04/03/22                    EVAL and initial HEP with urgency and bladder health   PATIENT EDUCATION:  Education details: Access Code: [email or web address] and urge techniques Person educated: Patient Education method: Explanation, Demonstration, Tactile cues, Verbal cues, and Handouts Education comprehension: verbalized understanding and returned demonstration  HOME EXERCISE PROGRAM: Access Code: [email or web address] URL: [email or web address] Date: 04/03/2022 Prepared by: Jari Favre  Exercises - Supine Hip Internal and External Rotation  - 1 x daily - 7 x weekly - 1 sets - 10 reps - 5 sec hold - Supine Figure 4 Piriformis Stretch  - 1 x daily - 7 x weekly - 1 sets - 3 reps - 30 sec hold - Standing 'L' Stretch at Counter  - 1 x daily - 7 x weekly - 3 sets - 10 reps - Seated Hamstring Stretch  - 1 x daily - 7 x weekly - 1 sets - 3 reps - 30 sec hold  ASSESSMENT:  CLINICAL IMPRESSION: Patient did well with urge techniques and reports less urgency but still not gone.   Pt was educated on kegel and coordinating with exhale.  Pt did well and was able to do correctly but still needs to think about it as she tends to do the opposite breathing pattern.  Pt will benefit from skilled PT to continue to work on strength and coordination.  OBJECTIVE IMPAIRMENTS: decreased coordination, decreased endurance, decreased ROM, decreased strength, increased muscle spasms, impaired flexibility, impaired tone, postural dysfunction, and pain.   ACTIVITY LIMITATIONS: standing and continence  PARTICIPATION LIMITATIONS: meal prep, cleaning, and community activity  PERSONAL FACTORS: 1-2 comorbidities: PMH: menopause, hysterectomy  are also affecting patient's functional outcome.   REHAB POTENTIAL: Excellent  CLINICAL DECISION MAKING: Stable/uncomplicated  EVALUATION COMPLEXITY: Low   GOALS: Goals reviewed with patient? Yes  SHORT TERM GOALS: Target date: 05/01/22 Updated 04/10/22  Ind with initial HEP Baseline: Goal status: MET  2.  Ind with urge techniques and drinking more than 40 oz of water Baseline:  Goal status: IN PROGRESS   LONG TERM GOALS: Target date: 06/26/2022  Pt will report she can stand for at least 30-45 minutes at a time without increased back pain Baseline:  Goal status: INITIAL  2.  Pt will report 50% less urgency due to improved pelvic floor strength during functional movement Baseline:  Goal status: INITIAL  3.  Pt will have nocturia of 1 x max per night Baseline:  Goal status: INITIAL  4.  Pt will be independent with advanced HEP to maintain improvements made throughout therapy  Baseline:  Goal status: INITIAL    PLAN:  PT FREQUENCY: 1x/week  PT DURATION: 12 weeks  PLANNED INTERVENTIONS: Therapeutic exercises, Therapeutic activity, Neuromuscular re-education, Balance training, Gait training, Patient/Family education, Self Care, Joint mobilization, Aquatic Therapy, Dry Needling, Electrical stimulation, Cryotherapy, Moist heat,  Taping, Traction, Biofeedback, Manual therapy, and Re-evaluation  PLAN FOR NEXT SESSION: work on core strength and coordination of breathing and pelvic floor isolation, f/u on additional HEP exercises   Jule Ser, PT 04/10/2022, 12:33 PM

## 2022-04-11 ENCOUNTER — Other Ambulatory Visit: Payer: Self-pay | Admitting: Internal Medicine

## 2022-04-23 ENCOUNTER — Other Ambulatory Visit: Payer: Self-pay | Admitting: Nurse Practitioner

## 2022-04-23 DIAGNOSIS — R058 Other specified cough: Secondary | ICD-10-CM

## 2022-04-23 NOTE — Therapy (Unsigned)
OUTPATIENT PHYSICAL THERAPY FEMALE PELVIC TREATMENT   Patient Name: Amy Stokes MRN: [id number] DOB:1951/01/09, 72 y.o., female Today's Date: 04/24/2022  END OF SESSION:     Past Medical History:  Diagnosis Date   Arthritis    no meds   Asthma    Edema    Fibroids    Hyperlipidemia    Hypertension    Obesity    RML pneumonia 02/25/2021   Sleep apnea    wears cpap    Thyroid nodule 01/2009   left discovered on chest ct; needle bx "indeterminant" cannon r/o follicular neoplasm    Past Surgical History:  Procedure Laterality Date   ABDOMINAL HYSTERECTOMY Bilateral 08/02/2013   Procedure: TOTAL ABDOMINAL HYSTERECTOMY WITH BILATERAL SALPINGO-OOPHORECTOMY ;  Surgeon: Terrance Mass, MD;  Location: [facility];  Service: Gynecology;  Laterality: Bilateral;    Dr. Phineas Real to assist.  Dr. Zella Richer to follow with an umbilical hernia repair. He will need an additional 1 1/2 hours.    COLONOSCOPY  12/27/2007   [facility]    HERNIA REPAIR     MOUTH SURGERY     thyroid needle bx     TONSILLECTOMY     UMBILICAL HERNIA REPAIR N/A 08/02/2013   Procedure: HERNIA REPAIR UMBILICAL ADULT WITH MESH;  Surgeon: Odis Hollingshead, MD;  Location: [facility];  Service: General;  Laterality: N/A;   Patient Active Problem List   Diagnosis Date Noted   Not well controlled severe persistent asthma 01/08/2022   Gastroesophageal reflux disease 01/08/2022   Other allergic rhinitis 07/29/2021   History of eczema as a child 07/29/2021   Elevated IgE level 07/05/2021   Upper airway cough syndrome 06/04/2021   Bilateral lower extremity edema 06/04/2021   Atrial fibrillation, chronic ([place]) 02/27/2021   Hypothyroidism 02/27/2021   Hypokalemia 02/27/2021   Cough 08/09/2015   Iliotibial band syndrome affecting left lower leg 05/26/2014   Arthritis of left lower extremity 05/26/2014   Low HDL (under 40) 04/08/2014   Thyroid nodule 02/12/2014   OSA (obstructive sleep apnea) 84/16/6063   Seroma  complicating a procedure 08/18/2013   Bleeding gums 06/09/2011   ALLERGY 01/31/2010   MORBID OBESITY 07/16/2009   Multinodular goiter (nontoxic) 01/13/2008   RECTAL BLEEDING 11/22/2007   UNS ADVRS EFF UNS RX MEDICINAL&BIOLOGICAL SBSTNC 07/26/2007   HYPERLIPIDEMIA 04/06/2007   Essential hypertension 03/05/2007   TACHYCARDIA 03/05/2007   Not well controlled moderate persistent asthma 12/28/2006    PCP: Burnis Medin, MD   REFERRING PROVIDER: [name], MD   REFERRING DIAG: N39.46 (ICD-10-CM) - Mixed incontinence   THERAPY DIAG:  Unspecified lack of coordination  Muscle weakness (generalized)  Abnormal posture  Rationale for Evaluation and Treatment: Rehabilitation  ONSET DATE: over a year  SUBJECTIVE:  SUBJECTIVE STATEMENT: I feel more in control, less urgency, only getting up 1x/ night.  I have a cough with asthma and sometimes that caues leakage but not as much Fluid intake: Yes: mostly water up to 40 oz    PAIN:  Are you having pain? No   PRECAUTIONS: None  WEIGHT BEARING RESTRICTIONS: No  FALLS:  Has patient fallen in last 6 months? No  LIVING ENVIRONMENT: Lives with: lives with their spouse Lives in: House/apartment   OCCUPATION: No retired,  Hobbies: workout with weights at [place]: be able to not leak  PERTINENT HISTORY:  Arthritis, hysterectomy (total abdominal with bil ovary removed) Sexual abuse: No  BOWEL MOVEMENT: Pain with bowel movement: No Type of bowel movement:Type (Bristol Stool Scale) normal, Frequency normal, and Strain No Fully empty rectum: Yes:   Leakage: No Pads: No Fiber supplement: Yes: psyllium  URINATION: Pain with urination: No Fully empty bladder: Yes:   Stream:  doesn't match the  urgency Urgency: Yes:   Frequency: every few hours, 2 nocturia Leakage: Urge to void, cough/sneeze just a little Pads: Yes: 1/day  INTERCOURSE: Pain with intercourse: Not sexually active  PREGNANCY: No  PROLAPSE:    OBJECTIVE:   DIAGNOSTIC FINDINGS:    PATIENT SURVEYS:    PFIQ-7   COGNITION: Overall cognitive status: Within functional limits for tasks assessed     SENSATION:  MUSCLE LENGTH: Hamstrings: Right 80 deg; Left 80 deg Thomas test:   LUMBAR SPECIAL TESTS:    FUNCTIONAL TESTS:  Single leg stand - adduction and IR on left hip  GAIT: Comments: WFL   POSTURE: rounded shoulders, forward head, increased lumbar lordosis, and anterior pelvic tilt  PELVIC ALIGNMENT: normal  LUMBARAROM/PROM:  A/PROM A/PROM  eval  Flexion WFL   Extension   Right lateral flexion   Left lateral flexion 75%  Right rotation 75%  Left rotation    (Blank rows = not tested)  LOWER EXTREMITY ROM:  Passive ROM Right eval Left eval  Hip flexion    Hip extension    Hip abduction    Hip adduction    Hip internal rotation    Hip external rotation 80% WFL   Knee flexion    Knee extension    Ankle dorsiflexion    Ankle plantarflexion    Ankle inversion    Ankle eversion     (Blank rows = not tested)  LOWER EXTREMITY MMT:  MMT Right eval Left eval  Hip flexion 5 5  Hip extension 5 5  Hip abduction 5 4/5  Hip adduction 5 5  Hip internal rotation 5 5  Hip external rotation 5 4/5  Knee flexion    Knee extension    Ankle dorsiflexion    Ankle plantarflexion    Ankle inversion    Ankle eversion     PALPATION:   General  lumbar and thoracic paraspinals and gluteals tight                External Perineal Exam normal tone no tenderness                             Internal Pelvic Floor levators high tone no tenderness, some fascial restriction around   Patient confirms identification and approves PT to assess internal pelvic floor and treatment  Yes  PELVIC MMT:   MMT eval  Vaginal 3/5 holding 12 sec; 1 rep then was 2/5 MMT  Internal Anal Sphincter   External Anal Sphincter   Puborectalis   Diastasis Recti   (Blank rows = not tested)        TONE: high  PROLAPSE: no  TODAY'S TREATMENT:                                                                                                                              DATE: 04/24/22                    Exercises: Contract relax hip ER, IR, ext with overpressure on relax Hip ER stretch in supine NMRE: Transversus abdominus with exale UE overhead, add 2lb weight, add block, add march, add yellow loop hip abduction      PATIENT EDUCATION:  Education details: Access Code: [email or web address] and urge techniques Person educated: Patient Education method: Explanation, Demonstration, Tactile cues, Verbal cues, and Handouts Education comprehension: verbalized understanding and returned demonstration  HOME EXERCISE PROGRAM: Access Code: [email or web address] URL: [email or web address] Date: 04/24/2022 Prepared by: Jari Favre  Exercises - Supine Hip Internal and External Rotation  - 1 x daily - 7 x weekly - 1 sets - 10 reps - 5 sec hold - Supine Figure 4 Piriformis Stretch  - 1 x daily - 7 x weekly - 1 sets - 3 reps - 30 sec hold - Seated Hamstring Stretch  - 1 x daily - 7 x weekly - 1 sets - 3 reps - 30 sec hold - Seated Figure 4 Piriformis Stretch  - 1 x daily - 7 x weekly - 1 sets - 3 reps - 30 hold - Standing 'L' Stretch at Counter  - 1 x daily - 7 x weekly - 3 sets - 10 reps - Seated Pelvic Floor Contraction with Isometric Hip Adduction  - 3 x daily - 7 x weekly - 1 sets - 10 reps - Supine Transversus Abdominis Bracing - Hands on Stomach  - 1 x daily - 7 x weekly - 3 sets - 10 reps  ASSESSMENT:  CLINICAL IMPRESSION: Patient did well with improved core activation.  Pt still needed cues not to bulge abdomen.  Pt will benefit from skilled PT to work on strength in more  challengeing functional positions without bulging and using diaphragm to stabilize.  OBJECTIVE IMPAIRMENTS: decreased coordination, decreased endurance, decreased ROM, decreased strength, increased muscle spasms, impaired flexibility, impaired tone, postural dysfunction, and pain.   ACTIVITY LIMITATIONS: standing and continence  PARTICIPATION LIMITATIONS: meal prep, cleaning, and community activity  PERSONAL FACTORS: 1-2 comorbidities: PMH: menopause, hysterectomy  are also affecting patient's functional outcome.   REHAB POTENTIAL: Excellent  CLINICAL DECISION MAKING: Stable/uncomplicated  EVALUATION COMPLEXITY: Low   GOALS: Goals reviewed with patient? Yes  SHORT TERM GOALS: Target date: 05/01/22 Updated 04/10/22  Ind with initial HEP Baseline: Goal status: MET  2.  Ind with urge techniques and drinking more than 40 oz of water Baseline:  Goal status: MET   LONG TERM  GOALS: Target date: 06/26/2022  Pt will report she can stand for at least 30-45 minutes at a time without increased back pain Baseline: 30-45 min Goal status: MET  2.  Pt will report 50% less urgency due to improved pelvic floor strength during functional movement Baseline: better but sometimes still just going a small amount Goal status: IN PROGRESS  3.  Pt will have nocturia of 1 x max per night Baseline: has been 1x around 4am which is much better Goal status: IN PROGRESS  4.  Pt will be independent with advanced HEP to maintain improvements made throughout therapy  Baseline:  Goal status: IN PROGRESS    PLAN:  PT FREQUENCY: 1x/week  PT DURATION: 12 weeks  PLANNED INTERVENTIONS: Therapeutic exercises, Therapeutic activity, Neuromuscular re-education, Balance training, Gait training, Patient/Family education, Self Care, Joint mobilization, Aquatic Therapy, Dry Needling, Electrical stimulation, Cryotherapy, Moist heat, Taping, Traction, Biofeedback, Manual therapy, and Re-evaluation  PLAN FOR  NEXT SESSION: work on core strength and coordination of breathing with functional movements - add band ex's; f/u on additional HEP exercises   [employer], PT 04/24/2022, 8:50 AM

## 2022-04-24 ENCOUNTER — Ambulatory Visit: Payer: Medicare HMO | Admitting: Physical Therapy

## 2022-04-24 DIAGNOSIS — R293 Abnormal posture: Secondary | ICD-10-CM | POA: Diagnosis not present

## 2022-04-24 DIAGNOSIS — R279 Unspecified lack of coordination: Secondary | ICD-10-CM | POA: Diagnosis not present

## 2022-04-24 DIAGNOSIS — M6281 Muscle weakness (generalized): Secondary | ICD-10-CM

## 2022-04-24 DIAGNOSIS — N3946 Mixed incontinence: Secondary | ICD-10-CM | POA: Diagnosis not present

## 2022-04-29 ENCOUNTER — Ambulatory Visit: Payer: Medicare HMO | Admitting: Physical Therapy

## 2022-04-29 DIAGNOSIS — R279 Unspecified lack of coordination: Secondary | ICD-10-CM

## 2022-04-29 DIAGNOSIS — N3946 Mixed incontinence: Secondary | ICD-10-CM | POA: Diagnosis not present

## 2022-04-29 DIAGNOSIS — M6281 Muscle weakness (generalized): Secondary | ICD-10-CM | POA: Diagnosis not present

## 2022-04-29 DIAGNOSIS — R293 Abnormal posture: Secondary | ICD-10-CM

## 2022-04-29 NOTE — Therapy (Addendum)
OUTPATIENT PHYSICAL THERAPY FEMALE PELVIC TREATMENT   Patient Name: Amy Stokes MRN: [id number] DOB:08/18/1950, 72 y.o., female Today's Date: 04/29/2022  END OF SESSION:  PT End of Session - 04/29/22 1009     Visit Number 4    Date for PT Re-Evaluation 06/26/22    Authorization Type [employer] medicare    PT Start Time 1009    PT Stop Time 1050    PT Time Calculation (min) 41 min    Activity Tolerance Patient tolerated treatment well    Behavior During Therapy [facility] for tasks assessed/performed               Past Medical History:  Diagnosis Date   Arthritis    no meds   Asthma    Edema    Fibroids    Hyperlipidemia    Hypertension    Obesity    RML pneumonia 02/25/2021   Sleep apnea    wears cpap    Thyroid nodule 01/2009   left discovered on chest ct; needle bx "indeterminant" cannon r/o follicular neoplasm    Past Surgical History:  Procedure Laterality Date   ABDOMINAL HYSTERECTOMY Bilateral 08/02/2013   Procedure: TOTAL ABDOMINAL HYSTERECTOMY WITH BILATERAL SALPINGO-OOPHORECTOMY ;  Surgeon: Terrance Mass, MD;  Location: [place] [facility];  Service: Gynecology;  Laterality: Bilateral;    Dr. Phineas Real to assist.  Dr. Zella Richer to follow with an umbilical hernia repair. He will need an additional 1 1/2 hours.    COLONOSCOPY  12/27/2007   [facility]    HERNIA REPAIR     MOUTH SURGERY     thyroid needle bx     TONSILLECTOMY     UMBILICAL HERNIA REPAIR N/A 08/02/2013   Procedure: HERNIA REPAIR UMBILICAL ADULT WITH MESH;  Surgeon: Odis Hollingshead, MD;  Location: [place] [facility];  Service: General;  Laterality: N/A;   Patient Active Problem List   Diagnosis Date Noted   Not well controlled severe persistent asthma 01/08/2022   Gastroesophageal reflux disease 01/08/2022   Other allergic rhinitis 07/29/2021   History of eczema as a child 07/29/2021   Elevated IgE level 07/05/2021   Upper airway cough syndrome 06/04/2021   Bilateral lower extremity edema 06/04/2021   Atrial  fibrillation, chronic ([place]) 02/27/2021   Hypothyroidism 02/27/2021   Hypokalemia 02/27/2021   Cough 08/09/2015   Iliotibial band syndrome affecting left lower leg 05/26/2014   Arthritis of left lower extremity 05/26/2014   Low HDL (under 40) 04/08/2014   Thyroid nodule 02/12/2014   OSA (obstructive sleep apnea) 03/00/9233   Seroma complicating a procedure 08/18/2013   Bleeding gums 06/09/2011   ALLERGY 01/31/2010   MORBID OBESITY 07/16/2009   Multinodular goiter (nontoxic) 01/13/2008   RECTAL BLEEDING 11/22/2007   UNS ADVRS EFF UNS RX MEDICINAL&BIOLOGICAL SBSTNC 07/26/2007   HYPERLIPIDEMIA 04/06/2007   Essential hypertension 03/05/2007   TACHYCARDIA 03/05/2007   Not well controlled moderate persistent asthma 12/28/2006    PCP: Burnis Medin, MD   REFERRING PROVIDER: [name], MD   REFERRING DIAG: N39.46 (ICD-10-CM) - Mixed incontinence   THERAPY DIAG:  Unspecified lack of coordination  Muscle weakness (generalized)  Abnormal posture  Rationale for Evaluation and Treatment: Rehabilitation  ONSET DATE: over a year  SUBJECTIVE:  SUBJECTIVE STATEMENT: I had one incident yesterday when I had a coffee and waited too long. The leakage was a gush that time. Fluid intake: Yes: mostly water up to 40 oz    PAIN:  Are you having pain? No   PRECAUTIONS: None  WEIGHT BEARING RESTRICTIONS: No  FALLS:  Has patient fallen in last 6 months? No  LIVING ENVIRONMENT: Lives with: lives with their spouse Lives in: House/apartment   OCCUPATION: No retired,  Hobbies: workout with weights at [place]: be able to not leak  PERTINENT HISTORY:  Arthritis, hysterectomy (total abdominal with bil ovary removed) Sexual abuse: No  BOWEL MOVEMENT: Pain  with bowel movement: No Type of bowel movement:Type (Bristol Stool Scale) normal, Frequency normal, and Strain No Fully empty rectum: Yes:   Leakage: No Pads: No Fiber supplement: Yes: psyllium  URINATION: Pain with urination: No Fully empty bladder: Yes:   Stream:  doesn't match the urgency Urgency: Yes:   Frequency: every few hours, 2 nocturia Leakage: Urge to void, cough/sneeze just a little Pads: Yes: 1/day  INTERCOURSE: Pain with intercourse: Not sexually active  PREGNANCY: No  PROLAPSE:    OBJECTIVE:   DIAGNOSTIC FINDINGS:    PATIENT SURVEYS:    PFIQ-7   COGNITION: Overall cognitive status: Within functional limits for tasks assessed     SENSATION:  MUSCLE LENGTH: Hamstrings: Right 80 deg; Left 80 deg Thomas test:   LUMBAR SPECIAL TESTS:    FUNCTIONAL TESTS:  Single leg stand - adduction and IR on left hip  GAIT: Comments: WFL   POSTURE: rounded shoulders, forward head, increased lumbar lordosis, and anterior pelvic tilt  PELVIC ALIGNMENT: normal  LUMBARAROM/PROM:  A/PROM A/PROM  eval  Flexion WFL   Extension   Right lateral flexion   Left lateral flexion 75%  Right rotation 75%  Left rotation    (Blank rows = not tested)  LOWER EXTREMITY ROM:  Passive ROM Right eval Left eval  Hip flexion    Hip extension    Hip abduction    Hip adduction    Hip internal rotation    Hip external rotation 80% WFL   Knee flexion    Knee extension    Ankle dorsiflexion    Ankle plantarflexion    Ankle inversion    Ankle eversion     (Blank rows = not tested)  LOWER EXTREMITY MMT:  MMT Right eval Left eval  Hip flexion 5 5  Hip extension 5 5  Hip abduction 5 4/5  Hip adduction 5 5  Hip internal rotation 5 5  Hip external rotation 5 4/5  Knee flexion    Knee extension    Ankle dorsiflexion    Ankle plantarflexion    Ankle inversion    Ankle eversion     PALPATION:   General  lumbar and thoracic paraspinals and gluteals  tight                External Perineal Exam normal tone no tenderness                             Internal Pelvic Floor levators high tone no tenderness, some fascial restriction around   Patient confirms identification and approves PT to assess internal pelvic floor and treatment Yes  PELVIC MMT:   MMT eval  Vaginal 3/5 holding 12 sec; 1 rep then was 2/5 MMT  Internal Anal Sphincter   External  Anal Sphincter   Puborectalis   Diastasis Recti   (Blank rows = not tested)        TONE: high  PROLAPSE: no  TODAY'S TREATMENT:                                                                                                                              DATE: 04/29/22                    NMRE: doing exhale with exertion for everything below  Standing band row - blue - 20x Standing diagonal 3lb - 10x bil Standing shoulder ext - 7lb - 20x Standing at wall - shoulder flexion - 10lb - 20x Wall reaches - 10x each side Standing - hip flex, ext, abduction - 10x bil  Exercises: Nustep 6 min L6 - core engaged H/s and piriformis stretch in sitting - 30sec bil      PATIENT EDUCATION:  Education details: Access Code: [email or web address] and urge techniques Person educated: Patient Education method: Explanation, Demonstration, Tactile cues, Verbal cues, and Handouts Education comprehension: verbalized understanding and returned demonstration  HOME EXERCISE PROGRAM: Access Code: [email or web address] URL: [email or web address] Date: 04/29/2022 Prepared by: Jari Favre  Exercises - Supine Hip Internal and External Rotation  - 1 x daily - 7 x weekly - 1 sets - 10 reps - 5 sec hold - Supine Figure 4 Piriformis Stretch  - 1 x daily - 7 x weekly - 1 sets - 3 reps - 30 sec hold - Seated Hamstring Stretch  - 1 x daily - 7 x weekly - 1 sets - 3 reps - 30 sec hold - Seated Figure 4 Piriformis Stretch  - 1 x daily - 7 x weekly - 1 sets - 3 reps - 30 hold - Standing 'L' Stretch at Counter  - 1  x daily - 7 x weekly - 3 sets - 10 reps - Seated Pelvic Floor Contraction with Isometric Hip Adduction  - 3 x daily - 7 x weekly - 1 sets - 10 reps - Supine Transversus Abdominis Bracing - Hands on Stomach  - 1 x daily - 7 x weekly - 3 sets - 10 reps - Shoulder Extension with Resistance - Palms Forward  - 1 x daily - 7 x weekly - 3 sets - 10 reps - Standing Shoulder Row with Anchored Resistance  - 1 x daily - 7 x weekly - 3 sets - 10 reps - Posterior Pelvic Tilt at [place]  - 1 x daily - 7 x weekly - 3 sets - 10 reps ASSESSMENT:  CLINICAL IMPRESSION: Patient had one episode of leakage this week that seemed to be related to after drinking coffee.  Pt was able to do exercise progressions and demonstrates exhale with exertion. Pt needs some cues to tuck tailbone and engage core throughout.  Pt was educated on how to apply this to exercises she is doing at the gym.  Pt will benefit from skilled PT to continue to work on strength and coordination to achieve all functional goals.  OBJECTIVE IMPAIRMENTS: decreased coordination, decreased endurance, decreased ROM, decreased strength, increased muscle spasms, impaired flexibility, impaired tone, postural dysfunction, and pain.   ACTIVITY LIMITATIONS: standing and continence  PARTICIPATION LIMITATIONS: meal prep, cleaning, and community activity  PERSONAL FACTORS: 1-2 comorbidities: PMH: menopause, hysterectomy  are also affecting patient's functional outcome.   REHAB POTENTIAL: Excellent  CLINICAL DECISION MAKING: Stable/uncomplicated  EVALUATION COMPLEXITY: Low   GOALS: Goals reviewed with patient? Yes  SHORT TERM GOALS: Target date: 05/01/22 Updated 04/10/22  Ind with initial HEP Baseline: Goal status: MET  2.  Ind with urge techniques and drinking more than 40 oz of water Baseline:  Goal status: MET   LONG TERM GOALS: Target date: 06/26/2022  Pt will report she can stand for at least 30-45 minutes at a time without  increased back pain Baseline: 30-45 min Goal status: MET  2.  Pt will report 50% less urgency due to improved pelvic floor strength during functional movement Baseline: better but sometimes still just going a small amount Goal status: IN PROGRESS  3.  Pt will have nocturia of 1 x max per night Baseline: has been 1x around 4am which is much better Goal status: IN PROGRESS  4.  Pt will be independent with advanced HEP to maintain improvements made throughout therapy  Baseline:  Goal status: IN PROGRESS    PLAN:  PT FREQUENCY: 1x/week  PT DURATION: 12 weeks  PLANNED INTERVENTIONS: Therapeutic exercises, Therapeutic activity, Neuromuscular re-education, Balance training, Gait training, Patient/Family education, Self Care, Joint mobilization, Aquatic Therapy, Dry Needling, Electrical stimulation, Cryotherapy, Moist heat, Taping, Traction, Biofeedback, Manual therapy, and Re-evaluation  PLAN FOR NEXT SESSION: re-assess pelvic floor, f/u on engaged core at gym   [employer], PT 04/29/2022, 10:46 AM

## 2022-04-30 ENCOUNTER — Telehealth: Payer: Self-pay | Admitting: Allergy

## 2022-04-30 MED ORDER — BREZTRI AEROSPHERE 160-9-4.8 MCG/ACT IN AERO: 2.0000 | INHALATION_SPRAY | Freq: Two times a day (BID) | RESPIRATORY_TRACT | 5 refills | Status: DC

## 2022-04-30 NOTE — Telephone Encounter (Signed)
Patient is requesting refill for breztri inhaler.   [facility] [place] [id number]  Best contact number: [phone]

## 2022-04-30 NOTE — Telephone Encounter (Signed)
Sent in refill for [facility] and called patient to inform her   Ty [phone]

## 2022-05-05 ENCOUNTER — Other Ambulatory Visit: Payer: Self-pay | Admitting: Internal Medicine

## 2022-05-05 DIAGNOSIS — G4733 Obstructive sleep apnea (adult) (pediatric): Secondary | ICD-10-CM | POA: Diagnosis not present

## 2022-05-05 DIAGNOSIS — Z9989 Dependence on other enabling machines and devices: Secondary | ICD-10-CM | POA: Diagnosis not present

## 2022-05-12 NOTE — Therapy (Unsigned)
OUTPATIENT PHYSICAL THERAPY FEMALE PELVIC TREATMENT   Patient Name: Amy Stokes MRN: [id number] DOB:[birth year], 72 y.o., female Today's Date: 05/13/2022  END OF SESSION:  PT End of Session - 05/13/22 0842     Visit Number 5    Date for PT Re-Evaluation 06/26/22    Authorization Type [employer]na medicare    PT Start Time 0845    PT Stop Time 0926    PT Time Calculation (min) 41 min    Activity Tolerance Patient tolerated treatment well    Behavior During Therapy [facility] for tasks assessed/performed                Past Medical History:  Diagnosis Date   Arthritis    no meds   Asthma    Edema    Fibroids    Hyperlipidemia    Hypertension    Obesity    RML pneumonia 02/25/2021   Sleep apnea    wears cpap    Thyroid nodule 01/2009   left discovered on chest ct; needle bx "indeterminant" cannon r/o follicular neoplasm    Past Surgical History:  Procedure Laterality Date   ABDOMINAL HYSTERECTOMY Bilateral 08/02/2013   Procedure: TOTAL ABDOMINAL HYSTERECTOMY WITH BILATERAL SALPINGO-OOPHORECTOMY ;  Surgeon: Terrance Mass, MD;  Location: [place] [facility];  Service: Gynecology;  Laterality: Bilateral;    Dr. Phineas Real to assist.  Dr. Zella Richer to follow with an umbilical hernia repair. He will need an additional 1 1/2 hours.    COLONOSCOPY  12/27/2007   [facility]    HERNIA REPAIR     MOUTH SURGERY     thyroid needle bx     TONSILLECTOMY     UMBILICAL HERNIA REPAIR N/A 08/02/2013   Procedure: HERNIA REPAIR UMBILICAL ADULT WITH MESH;  Surgeon: Odis Hollingshead, MD;  Location: [place] [facility];  Service: General;  Laterality: N/A;   Patient Active Problem List   Diagnosis Date Noted   Not well controlled severe persistent asthma 01/08/2022   Gastroesophageal reflux disease 01/08/2022   Other allergic rhinitis 07/29/2021   History of eczema as a child 07/29/2021   Elevated IgE level 07/05/2021   Upper airway cough syndrome 06/04/2021   Bilateral lower extremity edema 06/04/2021    Atrial fibrillation, chronic ([place]) 02/27/2021   Hypothyroidism 02/27/2021   Hypokalemia 02/27/2021   Cough 08/09/2015   Iliotibial band syndrome affecting left lower leg 05/26/2014   Arthritis of left lower extremity 05/26/2014   Low HDL (under 40) 04/08/2014   Thyroid nodule 02/12/2014   OSA (obstructive sleep apnea) [id number]   Seroma complicating a procedure 08/18/2013   Bleeding gums 06/09/2011   ALLERGY 01/31/2010   MORBID OBESITY 07/16/2009   Multinodular goiter (nontoxic) 01/13/2008   RECTAL BLEEDING 11/22/2007   UNS ADVRS EFF UNS RX MEDICINAL&BIOLOGICAL SBSTNC 07/26/2007   HYPERLIPIDEMIA 04/06/2007   Essential hypertension 03/05/2007   TACHYCARDIA 03/05/2007   Not well controlled moderate persistent asthma 12/28/2006    PCP: Burnis Medin, MD   REFERRING PROVIDER: [name], MD   REFERRING DIAG: N39.46 (ICD-10-CM) - Mixed incontinence   THERAPY DIAG:  Unspecified lack of coordination  Muscle weakness (generalized)  Abnormal posture  Rationale for Evaluation and Treatment: Rehabilitation  ONSET DATE: over a year  SUBJECTIVE:  SUBJECTIVE STATEMENT: I had a few incidents this week. One when drinking a lot of water and one time a coke and another time I don't remember.  Pt states just occasional gushes that do not happen daily and one small leak maybe daily.  Pt states nocturia only 1x at night. Fluid intake: Yes: mostly water up to 40 oz    PAIN:  Are you having pain? No   PRECAUTIONS: None  WEIGHT BEARING RESTRICTIONS: No  FALLS:  Has patient fallen in last 6 months? No  LIVING ENVIRONMENT: Lives with: lives with their spouse Lives in: House/apartment   OCCUPATION: No retired,  Hobbies: workout with weights at [place]:  be able to not leak  PERTINENT HISTORY:  Arthritis, hysterectomy (total abdominal with bil ovary removed) Sexual abuse: No  BOWEL MOVEMENT: Pain with bowel movement: No Type of bowel movement:Type (Bristol Stool Scale) normal, Frequency normal, and Strain No Fully empty rectum: Yes:   Leakage: No Pads: No Fiber supplement: Yes: psyllium  URINATION: Pain with urination: No Fully empty bladder: Yes:   Stream:  doesn't match the urgency Urgency: Yes:   Frequency: every few hours, 2 nocturia Leakage: Urge to void, cough/sneeze just a little Pads: Yes: 1/day  INTERCOURSE: Pain with intercourse: Not sexually active  PREGNANCY: No  PROLAPSE:    OBJECTIVE:   DIAGNOSTIC FINDINGS:    PATIENT SURVEYS:    PFIQ-7   COGNITION: Overall cognitive status: Within functional limits for tasks assessed     SENSATION:  MUSCLE LENGTH: Hamstrings: Right 80 deg; Left 80 deg Thomas test:   LUMBAR SPECIAL TESTS:    FUNCTIONAL TESTS:  Single leg stand - adduction and IR on left hip  GAIT: Comments: WFL   POSTURE: rounded shoulders, forward head, increased lumbar lordosis, and anterior pelvic tilt  PELVIC ALIGNMENT: normal  LUMBARAROM/PROM:  A/PROM A/PROM  eval  Flexion WFL   Extension   Right lateral flexion   Left lateral flexion 75%  Right rotation 75%  Left rotation    (Blank rows = not tested)  LOWER EXTREMITY ROM:  Passive ROM Right eval Left eval  Hip flexion    Hip extension    Hip abduction    Hip adduction    Hip internal rotation    Hip external rotation 80% WFL   Knee flexion    Knee extension    Ankle dorsiflexion    Ankle plantarflexion    Ankle inversion    Ankle eversion     (Blank rows = not tested)  LOWER EXTREMITY MMT:  MMT Right eval Left eval  Hip flexion 5 5  Hip extension 5 5  Hip abduction 5 4/5  Hip adduction 5 5  Hip internal rotation 5 5  Hip external rotation 5 4/5  Knee flexion    Knee extension    Ankle  dorsiflexion    Ankle plantarflexion    Ankle inversion    Ankle eversion     PALPATION:   General  lumbar and thoracic paraspinals and gluteals tight                External Perineal Exam normal tone no tenderness                             Internal Pelvic Floor levators high tone no tenderness, some fascial restriction around   Patient confirms identification and approves PT to assess internal pelvic floor  and treatment Yes  PELVIC MMT:   MMT eval 05/13/22   Vaginal 3/5 holding 12 sec; 1 rep then was 2/5 MMT 3/5 x 10 reps  Internal Anal Sphincter    External Anal Sphincter    Puborectalis    Diastasis Recti    (Blank rows = not tested)        TONE: High   PROLAPSE: no  TODAY'S TREATMENT:                                                                                                                              DATE: 05/13/22                    NMRE: doing exhale with exertion for everything below   Standing at wall - shoulder flexion - green band - 10x bil Wall reaches - 10x each side Standing - hip sliders abduction - 10x bil K[place] with exhale (blowing out candles) Mini squats with kegel - 15x Sit to stand with exhale kegel - 10x        PATIENT EDUCATION:  Education details: Access Code: [email or web address] and urge techniques Person educated: Patient Education method: Explanation, Demonstration, Tactile cues, Verbal cues, and Handouts Education comprehension: verbalized understanding and returned demonstration  HOME EXERCISE PROGRAM: Access Code: [email or web address] URL: [email or web address] Date: 04/29/2022 Prepared by: Jari Favre  Exercises - Supine Hip Internal and External Rotation  - 1 x daily - 7 x weekly - 1 sets - 10 reps - 5 sec hold - Supine Figure 4 Piriformis Stretch  - 1 x daily - 7 x weekly - 1 sets - 3 reps - 30 sec hold - Seated Hamstring Stretch  - 1 x daily - 7 x weekly - 1 sets - 3 reps - 30 sec hold - Seated Figure 4  Piriformis Stretch  - 1 x daily - 7 x weekly - 1 sets - 3 reps - 30 hold - Standing 'L' Stretch at Counter  - 1 x daily - 7 x weekly - 3 sets - 10 reps - Seated Pelvic Floor Contraction with Isometric Hip Adduction  - 3 x daily - 7 x weekly - 1 sets - 10 reps - Supine Transversus Abdominis Bracing - Hands on Stomach  - 1 x daily - 7 x weekly - 3 sets - 10 reps - Shoulder Extension with Resistance - Palms Forward  - 1 x daily - 7 x weekly - 3 sets - 10 reps - Standing Shoulder Row with Anchored Resistance  - 1 x daily - 7 x weekly - 3 sets - 10 reps - Posterior Pelvic Tilt at [place]  - 1 x daily - 7 x weekly - 3 sets - 10 reps ASSESSMENT:  CLINICAL IMPRESSION: Pt has made improvement with muscle coordination and endurance doing 10 kegels up from 1 rep.  Overall there is improvements but still having some leaks and mostly during movement like sit  to stand.  Today's session focused on functional movement with kegels for improved strength throughout functional range of motion. Pt will benefit from skilled PT to continue to work on strength and coordination to achieve all functional goals.  OBJECTIVE IMPAIRMENTS: decreased coordination, decreased endurance, decreased ROM, decreased strength, increased muscle spasms, impaired flexibility, impaired tone, postural dysfunction, and pain.   ACTIVITY LIMITATIONS: standing and continence  PARTICIPATION LIMITATIONS: meal prep, cleaning, and community activity  PERSONAL FACTORS: 1-2 comorbidities: PMH: menopause, hysterectomy  are also affecting patient's functional outcome.   REHAB POTENTIAL: Excellent  CLINICAL DECISION MAKING: Stable/uncomplicated  EVALUATION COMPLEXITY: Low   GOALS: Goals reviewed with patient? Yes  SHORT TERM GOALS: Target date: 05/01/22 Updated 04/10/22  Ind with initial HEP Baseline: Goal status: MET  2.  Ind with urge techniques and drinking more than 40 oz of water Baseline:  Goal status: MET   LONG TERM  GOALS: Target date: 06/26/2022 Updated 05/13/22  Pt will report she can stand for at least 30-45 minutes at a time without increased back pain Baseline: 30-45 min Goal status: MET  2.  Pt will report 50% less urgency due to improved pelvic floor strength during functional movement Baseline: 50% feels like good bladder control up from maybe 25% good days Goal status: IN PROGRESS  3.  Pt will have nocturia of 1 x max per night Baseline: has been 1x at most Goal status: MET  4.  Pt will be independent with advanced HEP to maintain improvements made throughout therapy  Baseline:  Goal status: IN PROGRESS    PLAN:  PT FREQUENCY: 1x/week  PT DURATION: 12 weeks  PLANNED INTERVENTIONS: Therapeutic exercises, Therapeutic activity, Neuromuscular re-education, Balance training, Gait training, Patient/Family education, Self Care, Joint mobilization, Aquatic Therapy, Dry Needling, Electrical stimulation, Cryotherapy, Moist heat, Taping, Traction, Biofeedback, Manual therapy, and Re-evaluation  PLAN FOR NEXT SESSION: progress functional movements, hip flexion on step with kegel, squats, abduction, etc, re-assess goals and d/c in 1-2 more visits   Jule Ser, PT 05/13/2022, 8:43 AM

## 2022-05-13 ENCOUNTER — Ambulatory Visit: Payer: Medicare HMO | Attending: Obstetrics and Gynecology | Admitting: Physical Therapy

## 2022-05-13 DIAGNOSIS — M6281 Muscle weakness (generalized): Secondary | ICD-10-CM | POA: Diagnosis not present

## 2022-05-13 DIAGNOSIS — R279 Unspecified lack of coordination: Secondary | ICD-10-CM | POA: Insufficient documentation

## 2022-05-13 DIAGNOSIS — R293 Abnormal posture: Secondary | ICD-10-CM | POA: Diagnosis not present

## 2022-05-16 ENCOUNTER — Telehealth: Payer: Self-pay

## 2022-05-16 NOTE — Progress Notes (Unsigned)
Care Management & Coordination Services Pharmacy Team  Reason for Encounter: Hypertension  Contacted patient to discuss hypertension disease state. {US HC Outreach:[id number]}    Current antihypertensive regimen:  Diltiazem 300 mg daily HCTZ 25 mg daily Losartan 100 mg daily Patient verbally confirms she is taking the above medications as directed. {yes/no:20286}  How often are you checking your Blood Pressure? {CHL HP BP Monitoring Frequency:[phone]}  she checks her blood pressure {timing:25218} {before/after:25217} taking her medication.  Current home BP readings:  DATE:             BP               PULSE   Wrist or arm cuff:  OTC medications including pseudoephedrine or NSAIDs?  Any readings above 180/100? {yes/no:20286} If yes any symptoms of hypertensive emergency? {hypertensive emergency symptoms:25354}  What recent interventions/DTPs have been made by any provider to improve Blood Pressure control since last CPP Visit: no recent interventions noted.   Any recent hospitalizations or ED visits since last visit with CPP? No recent hospital visits noted.   What diet changes have been made to improve Blood Pressure Control?  Patient follows no specific diet, lower sodium and lower sugar Breakfast - patient will have canadian bacon with eggs and english muffin or oatmeal / cereal and fruit Lunch - patient will have meat with cracker or a sandwich Dinner - patient will have a meat with mixed vegetables. Caffeine intake -  Salt intake -   What exercise is being done to improve your Blood Pressure Control?  Patient is going to a fitness center 3 days per week   Adherence Review: Is the patient currently on ACE/ARB medication? Yes Does the patient have >5 day gap between last estimated fill dates? No  Care Gaps: AWV - completed 09/10/21 Next appointment - scheduled 08/06/2022 Hep C Screen - never done Pap smear - overdue Covid - overdue   Star Rating Drug: Losartan  100 mg - last filled 04/12/2022 [facility] Rosuvastatin 10 mg - last filled 02/23/2022 90 [facility] at [facility]  Chart Updates: Recent office visits:  02/27/2022 Shanon Ace MD - Patient was seen for essential hypertension and additional concerns.No additional chart notes.   Recent consult visits:  03/25/2022 [name] MD (GYN) - Patient was seen for Gynecologic examination. No additional chart notes.   02/28/2022 [name] MD (cardiology) - Patient was seen for essential hypertension and additional concerns. Discontinued Flonase.   Hospital visits:  None  Medications: Outpatient Encounter Medications as of 05/16/2022  Medication Sig   acetaminophen (TYLENOL) 650 MG CR tablet Take 1,300 mg by mouth every 8 (eight) hours as needed for pain.   albuterol (VENTOLIN HFA) 108 (90 Base) MCG/ACT inhaler Inhale 2 puffs into the lungs every 6 (six) hours as needed for wheezing or shortness of breath.   apixaban (ELIQUIS) 5 MG TABS tablet Take 1 tablet (5 mg total) by mouth 2 (two) times daily.   Budeson-Glycopyrrol-Formoterol (BREZTRI AEROSPHERE) 160-9-4.8 MCG/ACT AERO Inhale 2 puffs into the lungs in the morning and at bedtime. with spacer and rinse mouth afterwards.   Cholecalciferol 50 MCG (2000 UT) CAPS Take 1 capsule by mouth daily. PATIENT USING OIL AND NOT CAPSULES   diltiazem (CARDIZEM CD) 300 MG 24 hr capsule TAKE 1 CAPSULE BY MOUTH EVERY DAY   fexofenadine (ALLEGRA) 180 MG tablet TAKE 1 TABLET BY MOUTH EVERY DAY   flecainide (TAMBOCOR) 50 MG tablet TAKE 1 TABLET BY MOUTH TWICE A DAY  hydrochlorothiazide (HYDRODIURIL) 25 MG tablet TAKE 1 TABLET (25 MG TOTAL) BY MOUTH DAILY.   ipratropium-albuterol (DUONEB) 0.5-2.5 (3) MG/3ML SOLN Take 3 mLs by nebulization every 6 (six) hours as needed.   levothyroxine (SYNTHROID) 100 MCG tablet TAKE 1 TABLET BY MOUTH EVERY MORNING ON AN EMPTY STOMACH Orally Once a day 90 days   losartan (COZAAR) 100 MG tablet TAKE 1 TABLET BY MOUTH EVERY DAY    Lutein 10 MG TABS Take 10 mg by mouth daily.   montelukast (SINGULAIR) 10 MG tablet Take 1 tablet (10 mg total) by mouth at bedtime.   MULTIPLE VITAMIN PO Take 1 tablet by mouth daily.   Multiple Vitamins-Minerals (PRESERVISION AREDS 2 PO) Take 1 tablet by mouth in the morning and at bedtime.   pantoprazole (PROTONIX) 40 MG tablet TAKE 1 TABLET BY MOUTH EVERY DAY   potassium chloride (KLOR-CON M10) 10 MEQ tablet TAKE 1 TABLET (10 MEQ TOTAL) BY MOUTH 3 (THREE) TIMES DAILY.   rosuvastatin (CRESTOR) 10 MG tablet Take 1 tablet (10 mg total) by mouth daily.   Spacer/Aero-Holding Dorise Bullion Use with spacer   valACYclovir (VALTREX) 500 MG tablet As needed   No facility-administered encounter medications on file as of 05/16/2022.  Fill History:   Dispensed Days Supply Quantity Provider Pharmacy  VALACYCLOVIR 500MG TAB 04/08/2021 15 30 tablet      Dispensed Days Supply Quantity Provider Pharmacy  ROSUVASTATIN CALCIUM 10 MG TAB 02/23/2022 90 90 each      Dispensed Days Supply Quantity Provider Pharmacy  KLOR-CON M10 TABLET 05/05/2022 90 270 each      Dispensed Days Supply Quantity Provider Pharmacy  PANTOPRAZOLE SOD DR 40 MG TAB 03/04/2022 90 90 each      Dispensed Days Supply Quantity Provider Pharmacy  MONTELUKAST SOD 10 MG TABLET 05/02/2022 90 90 each      Dispensed Days Supply Quantity Provider Pharmacy  LOSARTAN POTASSIUM 100 MG TAB 04/12/2022 90 90 each      Dispensed Days Supply Quantity Provider Pharmacy  LEVOTHYROXINE 100 MCG TABLET 03/04/2022 90 90 each      Dispensed Days Supply Quantity Provider Pharmacy  IPRAT-ALBUT 0.5-3(2.5) MG/3 ML 02/28/2021 30 360 mL      Dispensed Days Supply Quantity Provider Pharmacy  HYDROCHLOROTHIAZIDE 25 MG TAB 05/03/2022 90 90 each     FLECAINIDE ACETATE 50 MG TAB 05/03/2022 90 180 each    Dispensed Days Supply Quantity Provider Pharmacy  FEXOFENADINE HCL 180 MG TABLET 04/21/2022 30 30 each      Dispensed Days Supply Quantity Provider  Pharmacy  DILTIAZEM 24H ER(CD) 300 MG CP 03/25/2022 90 90 each      Dispensed Days Supply Quantity Provider Pharmacy  BREZTRI AEROSPHERE INHALER 05/01/2022 30 10.7 g      Dispensed Days Supply Quantity Provider Pharmacy  ELIQUIS 5 MG TABLET 08/03/2021 30 60 each      Dispensed Days Supply Quantity Provider Pharmacy  ALBUTEROL HFA 90 MCG INHALER 01/17/2020 30 8.5 g      Recent Office Vitals: BP Readings from Last 3 Encounters:  02/28/22 109/61  02/05/22 138/68  01/27/22 124/68   Pulse Readings from Last 3 Encounters:  02/28/22 71  02/05/22 72  01/27/22 74    Wt Readings from Last 3 Encounters:  02/28/22 286 lb 12.8 oz (130.1 kg)  02/05/22 288 lb (130.6 kg)  01/27/22 288 lb 9.6 oz (130.9 kg)     Kidney Function Lab Results  Component Value Date/Time   CREATININE 0.80 11/29/2021 07:53 AM  CREATININE 0.68 11/12/2021 10:43 AM   GFR 74.52 11/29/2021 07:53 AM   GFRNONAA >60 02/28/2021 04:08 AM   GFRAA 95 05/10/2008 09:44 AM       Latest Ref Rng & Units 11/29/2021    7:53 AM 11/12/2021   10:43 AM 09/10/2021    9:36 AM  BMP  Glucose 70 - 99 mg/dL 96  91  93   BUN 6 - 23 mg/dL 17  14  12   $ Creatinine 0.40 - 1.20 mg/dL 0.80  0.68  0.68   BUN/Creat Ratio 12 - 28   18   Sodium 135 - 145 mEq/L 138  138  141   Potassium 3.5 - 5.1 mEq/L 3.7  3.3  3.9   Chloride 96 - 112 mEq/L 97  97  98   CO2 19 - 32 mEq/L 35  37  30   Calcium 8.4 - 10.5 mg/dL 9.1  9.3  9.1    Char[facility] Pharmacist Assistant [phone]

## 2022-05-19 ENCOUNTER — Ambulatory Visit: Payer: Medicare HMO | Admitting: Physical Therapy

## 2022-05-19 ENCOUNTER — Encounter: Payer: Self-pay | Admitting: Physical Therapy

## 2022-05-19 DIAGNOSIS — M6281 Muscle weakness (generalized): Secondary | ICD-10-CM

## 2022-05-19 DIAGNOSIS — R293 Abnormal posture: Secondary | ICD-10-CM | POA: Diagnosis not present

## 2022-05-19 DIAGNOSIS — R279 Unspecified lack of coordination: Secondary | ICD-10-CM | POA: Diagnosis not present

## 2022-05-19 NOTE — Therapy (Signed)
OUTPATIENT PHYSICAL THERAPY FEMALE PELVIC TREATMENT   Patient Name: Amy Stokes MRN: [id number] DOB:12-04-1950, 72 y.o., female Today's Date: 05/19/2022  END OF SESSION:  PT End of Session - 05/19/22 0929     Visit Number 6    Date for PT Re-Evaluation 06/26/22    Authorization Type [employer] medicare    PT Start Time 0930    PT Stop Time 1010    PT Time Calculation (min) 40 min    Activity Tolerance Patient tolerated treatment well    Behavior During Therapy [facility] for tasks assessed/performed                 Past Medical History:  Diagnosis Date   Arthritis    no meds   Asthma    Edema    Fibroids    Hyperlipidemia    Hypertension    Obesity    RML pneumonia 02/25/2021   Sleep apnea    wears cpap    Thyroid nodule 01/2009   left discovered on chest ct; needle bx "indeterminant" cannon r/o follicular neoplasm    Past Surgical History:  Procedure Laterality Date   ABDOMINAL HYSTERECTOMY Bilateral 08/02/2013   Procedure: TOTAL ABDOMINAL HYSTERECTOMY WITH BILATERAL SALPINGO-OOPHORECTOMY ;  Surgeon: Terrance Mass, MD;  Location: [facility];  Service: Gynecology;  Laterality: Bilateral;    Dr. Phineas Real to assist.  Dr. Zella Richer to follow with an umbilical hernia repair. He will need an additional 1 1/2 hours.    COLONOSCOPY  12/27/2007   [facility]    HERNIA REPAIR     MOUTH SURGERY     thyroid needle bx     TONSILLECTOMY     UMBILICAL HERNIA REPAIR N/A 08/02/2013   Procedure: HERNIA REPAIR UMBILICAL ADULT WITH MESH;  Surgeon: Odis Hollingshead, MD;  Location: [facility];  Service: General;  Laterality: N/A;   Patient Active Problem List   Diagnosis Date Noted   Not well controlled severe persistent asthma 01/08/2022   Gastroesophageal reflux disease 01/08/2022   Other allergic rhinitis 07/29/2021   History of eczema as a child 07/29/2021   Elevated IgE level 07/05/2021   Upper airway cough syndrome 06/04/2021   Bilateral lower extremity edema 06/04/2021    Atrial fibrillation, chronic ([place]) 02/27/2021   Hypothyroidism 02/27/2021   Hypokalemia 02/27/2021   Cough 08/09/2015   Iliotibial band syndrome affecting left lower leg 05/26/2014   Arthritis of left lower extremity 05/26/2014   Low HDL (under 40) 04/08/2014   Thyroid nodule 02/12/2014   OSA (obstructive sleep apnea) [id number]   Seroma complicating a procedure 08/18/2013   Bleeding gums 06/09/2011   ALLERGY 01/31/2010   MORBID OBESITY 07/16/2009   Multinodular goiter (nontoxic) 01/13/2008   RECTAL BLEEDING 11/22/2007   UNS ADVRS EFF UNS RX MEDICINAL&BIOLOGICAL SBSTNC 07/26/2007   HYPERLIPIDEMIA 04/06/2007   Essential hypertension 03/05/2007   TACHYCARDIA 03/05/2007   Not well controlled moderate persistent asthma 12/28/2006    PCP: Burnis Medin, MD   REFERRING PROVIDER: [name], MD   REFERRING DIAG: N39.46 (ICD-10-CM) - Mixed incontinence   THERAPY DIAG:  Unspecified lack of coordination  Muscle weakness (generalized)  Abnormal posture  Rationale for Evaluation and Treatment: Rehabilitation  ONSET DATE: over a year  SUBJECTIVE:  SUBJECTIVE STATEMENT: No incidents this week.  Feeling 80% better with everything Fluid intake: Yes: mostly water up to 40 oz    PAIN:  Are you having pain? No   PRECAUTIONS: None  WEIGHT BEARING RESTRICTIONS: No  FALLS:  Has patient fallen in last 6 months? No  LIVING ENVIRONMENT: Lives with: lives with their spouse Lives in: House/apartment   OCCUPATION: No retired,  Hobbies: workout with weights at [employer]: be able to not leak  PERTINENT HISTORY:  Arthritis, hysterectomy (total abdominal with bil ovary removed) Sexual abuse: No  BOWEL MOVEMENT: Pain with bowel movement: No Type of  bowel movement:Type (B[place] Stool Scale) normal, Frequency normal, and Strain No Fully empty rectum: Yes:   Leakage: No Pads: No Fiber supplement: Yes: psyllium  URINATION: Pain with urination: No Fully empty bladder: Yes:   Stream:  doesn't match the urgency Urgency: Yes:   Frequency: every few hours, 2 nocturia Leakage: Urge to void, cough/sneeze just a little Pads: Yes: 1/day  INTERCOURSE: Pain with intercourse: Not sexually active  PREGNANCY: No  PROLAPSE:    OBJECTIVE:   DIAGNOSTIC FINDINGS:    PATIENT SURVEYS:    PFIQ-7   COGNITION: Overall cognitive status: Within functional limits for tasks assessed     SENSATION:  MUSCLE LENGTH: Hamstrings: Right 80 deg; Left 80 deg Thomas test:   LUMBAR SPECIAL TESTS:    FUNCTIONAL TESTS:  Single leg stand - adduction and IR on left hip  GAIT: Comments: WFL   POSTURE: rounded shoulders, forward head, increased lumbar lordosis, and anterior pelvic tilt  PELVIC ALIGNMENT: normal  LUMBARAROM/PROM:  A/PROM A/PROM  eval  Flexion WFL   Extension   Right lateral flexion   Left lateral flexion 75%  Right rotation 75%  Left rotation    (Blank rows = not tested)  LOWER EXTREMITY ROM:  Passive ROM Right eval Left eval  Hip flexion    Hip extension    Hip abduction    Hip adduction    Hip internal rotation    Hip external rotation 80% WFL   Knee flexion    Knee extension    Ankle dorsiflexion    Ankle plantarflexion    Ankle inversion    Ankle eversion     (Blank rows = not tested)  LOWER EXTREMITY MMT:  MMT Right eval Left eval  Hip flexion 5 5  Hip extension 5 5  Hip abduction 5 4/5  Hip adduction 5 5  Hip internal rotation 5 5  Hip external rotation 5 4/5  Knee flexion    Knee extension    Ankle dorsiflexion    Ankle plantarflexion    Ankle inversion    Ankle eversion     PALPATION:   General  lumbar and thoracic paraspinals and gluteals tight                External  Perineal Exam normal tone no tenderness                             Internal Pelvic Floor levators high tone no tenderness, some fascial restriction around   Patient confirms identification and approves PT to assess internal pelvic floor and treatment Yes  PELVIC MMT:   MMT eval 05/13/22   Vaginal 3/5 holding 12 sec; 1 rep then was 2/5 MMT 3/5 x 10 reps  Internal Anal Sphincter    External Anal Sphincter  Puborectalis    Diastasis Recti    (Blank rows = not tested)        TONE: High   PROLAPSE: no  TODAY'S TREATMENT:                                                                                                                              DATE: 05/19/22                    NMRE: doing exhale with exertion for everything below  Hip flexion at step Hip abduction and ext blue loop standing - 10x Seated ball squeeze and kick Step out - green band - 10x Shoulder ext - green band 10x Mini lunge 10x bil Standing at wall - shoulder flexion - 4lb - 10x bil Standing - hip sliders abduction - 10x bil Mini squats with kegel - 15x Nustep L5 x 12 min for endurance and review of goals during exercise        PATIENT EDUCATION:  Education details: Access Code: [email or web address] and urge techniques Person educated: Patient Education method: Explanation, Demonstration, Tactile cues, Verbal cues, and Handouts Education comprehension: verbalized understanding and returned demonstration  HOME EXERCISE PROGRAM: Access Code: [email or web address] URL: [email or web address] Date: 04/29/2022 Prepared by: Jari Favre  Exercises - Supine Hip Internal and External Rotation  - 1 x daily - 7 x weekly - 1 sets - 10 reps - 5 sec hold - Supine Figure 4 Piriformis Stretch  - 1 x daily - 7 x weekly - 1 sets - 3 reps - 30 sec hold - Seated Hamstring Stretch  - 1 x daily - 7 x weekly - 1 sets - 3 reps - 30 sec hold - Seated Figure 4 Piriformis Stretch  - 1 x daily - 7 x weekly - 1 sets - 3  reps - 30 hold - Standing 'L' Stretch at Counter  - 1 x daily - 7 x weekly - 3 sets - 10 reps - Seated Pelvic Floor Contraction with Isometric Hip Adduction  - 3 x daily - 7 x weekly - 1 sets - 10 reps - Supine Transversus Abdominis Bracing - Hands on Stomach  - 1 x daily - 7 x weekly - 3 sets - 10 reps - Shoulder Extension with Resistance - Palms Forward  - 1 x daily - 7 x weekly - 3 sets - 10 reps - Standing Shoulder Row with Anchored Resistance  - 1 x daily - 7 x weekly - 3 sets - 10 reps - Posterior Pelvic Tilt at [place]  - 1 x daily - 7 x weekly - 3 sets - 10 reps ASSESSMENT:  CLINICAL IMPRESSION: Pt has made improvement with muscle coordination and endurance and feels 80% better at this time.  Pt is ind with HEP and doing well with exhaling on exertion . Pt will d/c from skilled PT today  OBJECTIVE IMPAIRMENTS: decreased coordination, decreased endurance, decreased ROM, decreased  strength, increased muscle spasms, impaired flexibility, impaired tone, postural dysfunction, and pain.   ACTIVITY LIMITATIONS: standing and continence  PARTICIPATION LIMITATIONS: meal prep, cleaning, and community activity  PERSONAL FACTORS: 1-2 comorbidities: PMH: menopause, hysterectomy  are also affecting patient's functional outcome.   REHAB POTENTIAL: Excellent  CLINICAL DECISION MAKING: Stable/uncomplicated  EVALUATION COMPLEXITY: Low   GOALS: Goals reviewed with patient? Yes  SHORT TERM GOALS: Target date: 05/01/22 Updated 04/10/22  Ind with initial HEP Baseline: Goal status: MET  2.  Ind with urge techniques and drinking more than 40 oz of water Baseline:  Goal status: MET   LONG TERM GOALS: Target date: 06/26/2022 Updated 05/13/22  Pt will report she can stand for at least 30-45 minutes at a time without increased back pain Baseline: 30-45 min Goal status: MET  2.  Pt will report 50% less urgency due to improved pelvic floor strength during functional  movement Baseline: 80% better Goal status: MET  3.  Pt will have nocturia of 1 x max per night Baseline: has been 1x at most Goal status: MET  4.  Pt will be independent with advanced HEP to maintain improvements made throughout therapy  Baseline:  Goal status: MET    PLAN:  PT FREQUENCY: 1x/week  PT DURATION: 12 weeks  PLANNED INTERVENTIONS: Therapeutic exercises, Therapeutic activity, Neuromuscular re-education, Balance training, Gait training, Patient/Family education, Self Care, Joint mobilization, Aquatic Therapy, Dry Needling, Electrical stimulation, Cryotherapy, Moist heat, Taping, Traction, Biofeedback, Manual therapy, and Re-evaluation  PLAN FOR NEXT SESSION: d/c   Jule Ser, PT 05/19/2022, 10:07 AM  PHYSICAL THERAPY DISCHARGE SUMMARY  Visits from Start of Care: 6  Current functional level related to goals / functional outcomes: See above   Remaining deficits: See above   Education / Equipment: HEP   Patient agrees to discharge. Patient goals were met. Patient is being discharged due to meeting the stated rehab goals.  Gustavus Bryant, PT, DPT 05/19/22 10:07 AM

## 2022-05-26 ENCOUNTER — Encounter: Payer: Medicare HMO | Admitting: Physical Therapy

## 2022-06-03 DIAGNOSIS — Z9989 Dependence on other enabling machines and devices: Secondary | ICD-10-CM | POA: Diagnosis not present

## 2022-06-03 DIAGNOSIS — G4733 Obstructive sleep apnea (adult) (pediatric): Secondary | ICD-10-CM | POA: Diagnosis not present

## 2022-06-16 ENCOUNTER — Other Ambulatory Visit: Payer: Self-pay | Admitting: Nurse Practitioner

## 2022-06-16 DIAGNOSIS — J302 Other seasonal allergic rhinitis: Secondary | ICD-10-CM

## 2022-06-24 DIAGNOSIS — H31091 Other chorioretinal scars, right eye: Secondary | ICD-10-CM | POA: Diagnosis not present

## 2022-06-24 DIAGNOSIS — H353131 Nonexudative age-related macular degeneration, bilateral, early dry stage: Secondary | ICD-10-CM | POA: Diagnosis not present

## 2022-06-24 DIAGNOSIS — H524 Presbyopia: Secondary | ICD-10-CM | POA: Diagnosis not present

## 2022-06-24 DIAGNOSIS — H40013 Open angle with borderline findings, low risk, bilateral: Secondary | ICD-10-CM | POA: Diagnosis not present

## 2022-06-24 DIAGNOSIS — H2513 Age-related nuclear cataract, bilateral: Secondary | ICD-10-CM | POA: Diagnosis not present

## 2022-06-28 ENCOUNTER — Encounter (HOSPITAL_BASED_OUTPATIENT_CLINIC_OR_DEPARTMENT_OTHER): Payer: Self-pay

## 2022-06-30 DIAGNOSIS — R69 Illness, unspecified: Secondary | ICD-10-CM | POA: Diagnosis not present

## 2022-07-04 ENCOUNTER — Other Ambulatory Visit (HOSPITAL_BASED_OUTPATIENT_CLINIC_OR_DEPARTMENT_OTHER): Payer: Self-pay | Admitting: Cardiology

## 2022-07-04 DIAGNOSIS — I480 Paroxysmal atrial fibrillation: Secondary | ICD-10-CM

## 2022-07-04 DIAGNOSIS — Z9989 Dependence on other enabling machines and devices: Secondary | ICD-10-CM | POA: Diagnosis not present

## 2022-07-04 DIAGNOSIS — G4733 Obstructive sleep apnea (adult) (pediatric): Secondary | ICD-10-CM | POA: Diagnosis not present

## 2022-07-04 NOTE — Telephone Encounter (Signed)
Please review for refill. Thank you! 

## 2022-07-04 NOTE — Telephone Encounter (Signed)
Prescription refill request for Eliquis received. Indication: Afib  Last office visit: 02/28/22 Cristal Deer)  Scr: 0.80 (11/29/21)  Age: 72 Weight: 130.1kg  Appropriate dose. Refill sent.

## 2022-07-15 ENCOUNTER — Telehealth: Payer: Self-pay | Admitting: Internal Medicine

## 2022-07-15 NOTE — Telephone Encounter (Signed)
Contacted Amy Stokes to schedule their annual wellness visit. Appointment made for 09/10/21.  Rudell Cobb AWV direct phone # [phone]

## 2022-07-16 DIAGNOSIS — Z0100 Encounter for examination of eyes and vision without abnormal findings: Secondary | ICD-10-CM | POA: Diagnosis not present

## 2022-07-21 ENCOUNTER — Other Ambulatory Visit: Payer: Self-pay | Admitting: Internal Medicine

## 2022-07-23 ENCOUNTER — Ambulatory Visit: Payer: Medicare HMO

## 2022-07-23 VITALS — BP 120/60 | HR 70 | Temp 97.7°F | Ht 68.0 in | Wt 289.5 lb

## 2022-07-23 DIAGNOSIS — Z0000 Encounter for general adult medical examination without abnormal findings: Secondary | ICD-10-CM

## 2022-07-23 NOTE — Patient Instructions (Addendum)
Amy Stokes , Thank you for taking time to come for your Medicare Wellness Visit. I appreciate your ongoing commitment to your health goals. Please review the following plan we discussed and let me know if I can assist you in the future.   These are the goals we discussed:  Goals       No current goals (pt-stated)      Patient Stated      More physically fit       Patient Stated      09/10/2021, stay fit, remain independent        This is a list of the screening recommended for you and due dates:  Health Maintenance  Topic Date Due   Pap Smear  09/29/2015   COVID-19 Vaccine (7 - 2023-24 season) 08/08/2022*   Hepatitis C Screening: USPSTF Recommendation to screen - Ages 18-79 yo.  07/23/2023*   Flu Shot  10/30/2022   Mammogram  02/27/2023   Medicare Annual Wellness Visit  07/23/2023   Colon Cancer Screening  05/28/2028   DTaP/Tdap/Td vaccine (3 - Td or Tdap) 12/29/2031   Pneumonia Vaccine  Completed   DEXA scan (bone density measurement)  Completed   Zoster (Shingles) Vaccine  Completed   HPV Vaccine  Aged Out  *Topic was postponed. The date shown is not the original due date.    Advanced directives: Advance directive discussed with you today. Even though you declined this today, please call our office should you change your mind, and we can give you the proper paperwork for you to fill out.   Conditions/risks identified: None  Next appointment: Follow up in one year for your annual wellness visit    Preventive Care 65 Years and Older, Female Preventive care refers to lifestyle choices and visits with your health care provider that can promote health and wellness. What does preventive care include? A yearly physical exam. This is also called an annual well check. Dental exams once or twice a year. Routine eye exams. Ask your health care provider how often you should have your eyes checked. Personal lifestyle choices, including: Daily care of your teeth and  gums. Regular physical activity. Eating a healthy diet. Avoiding tobacco and drug use. Limiting alcohol use. Practicing safe sex. Taking low-dose aspirin every day. Taking vitamin and mineral supplements as recommended by your health care provider. What happens during an annual well check? The services and screenings done by your health care provider during your annual well check will depend on your age, overall health, lifestyle risk factors, and family history of disease. Counseling  Your health care provider may ask you questions about your: Alcohol use. Tobacco use. Drug use. Emotional well-being. Home and relationship well-being. Sexual activity. Eating habits. History of falls. Memory and ability to understand (cognition). Work and work Astronomer. Reproductive health. Screening  You may have the following tests or measurements: Height, weight, and BMI. Blood pressure. Lipid and cholesterol levels. These may be checked every 5 years, or more frequently if you are over 20 years old. Skin check. Lung cancer screening. You may have this screening every year starting at age 20 if you have a 30-pack-year history of smoking and currently smoke or have quit within the past 15 years. Fecal occult blood test (FOBT) of the stool. You may have this test every year starting at age 40. Flexible sigmoidoscopy or colonoscopy. You may have a sigmoidoscopy every 5 years or a colonoscopy every 10 years starting at age 27. Hepatitis C blood  test. Hepatitis B blood test. Sexually transmitted disease (STD) testing. Diabetes screening. This is done by checking your blood sugar (glucose) after you have not eaten for a while (fasting). You may have this done every 1-3 years. Bone density scan. This is done to screen for osteoporosis. You may have this done starting at age 66. Mammogram. This may be done every 1-2 years. Talk to your health care provider about how often you should have regular  mammograms. Talk with your health care provider about your test results, treatment options, and if necessary, the need for more tests. Vaccines  Your health care provider may recommend certain vaccines, such as: Influenza vaccine. This is recommended every year. Tetanus, diphtheria, and acellular pertussis (Tdap, Td) vaccine. You may need a Td booster every 10 years. Zoster vaccine. You may need this after age 73. Pneumococcal 13-valent conjugate (PCV13) vaccine. One dose is recommended after age 44. Pneumococcal polysaccharide (PPSV23) vaccine. One dose is recommended after age 17. Talk to your health care provider about which screenings and vaccines you need and how often you need them. This information is not intended to replace advice given to you by your health care provider. Make sure you discuss any questions you have with your health care provider. Document Released: 04/13/2015 Document Revised: 12/05/2015 Document Reviewed: 01/16/2015 Elsevier Interactive Patient Education  2017 [place] Prevention in the Home Falls can cause injuries. They can happen to people of all ages. There are many things you can do to make your home safe and to help prevent falls. What can I do on the outside of my home? Regularly fix the edges of walkways and driveways and fix any cracks. Remove anything that might make you trip as you walk through a door, such as a raised step or threshold. Trim any bushes or trees on the path to your home. Use bright outdoor lighting. Clear any walking paths of anything that might make someone trip, such as rocks or tools. Regularly check to see if handrails are loose or broken. Make sure that both sides of any steps have handrails. Any raised decks and porches should have guardrails on the edges. Have any leaves, snow, or ice cleared regularly. Use sand or salt on walking paths during winter. Clean up any spills in your garage right away. This includes oil  or grease spills. What can I do in the bathroom? Use night lights. Install grab bars by the toilet and in the tub and shower. Do not use towel bars as grab bars. Use non-skid mats or decals in the tub or shower. If you need to sit down in the shower, use a plastic, non-slip stool. Keep the floor dry. Clean up any water that spills on the floor as soon as it happens. Remove soap buildup in the tub or shower regularly. Attach bath mats securely with double-sided non-slip rug tape. Do not have throw rugs and other things on the floor that can make you trip. What can I do in the bedroom? Use night lights. Make sure that you have a light by your bed that is easy to reach. Do not use any sheets or blankets that are too big for your bed. They should not hang down onto the floor. Have a firm chair that has side arms. You can use this for support while you get dressed. Do not have throw rugs and other things on the floor that can make you trip. What can I do in the kitchen? Clean  up any spills right away. Avoid walking on wet floors. Keep items that you use a lot in easy-to-reach places. If you need to reach something above you, use a strong step stool that has a grab bar. Keep electrical cords out of the way. Do not use floor polish or wax that makes floors slippery. If you must use wax, use non-skid floor wax. Do not have throw rugs and other things on the floor that can make you trip. What can I do with my stairs? Do not leave any items on the stairs. Make sure that there are handrails on both sides of the stairs and use them. Fix handrails that are broken or loose. Make sure that handrails are as long as the stairways. Check any carpeting to make sure that it is firmly attached to the stairs. Fix any carpet that is loose or worn. Avoid having throw rugs at the top or bottom of the stairs. If you do have throw rugs, attach them to the floor with carpet tape. Make sure that you have a light  switch at the top of the stairs and the bottom of the stairs. If you do not have them, ask someone to add them for you. What else can I do to help prevent falls? Wear shoes that: Do not have high heels. Have rubber bottoms. Are comfortable and fit you well. Are closed at the toe. Do not wear sandals. If you use a stepladder: Make sure that it is fully opened. Do not climb a closed stepladder. Make sure that both sides of the stepladder are locked into place. Ask someone to hold it for you, if possible. Clearly mark and make sure that you can see: Any grab bars or handrails. First and last steps. Where the edge of each step is. Use tools that help you move around (mobility aids) if they are needed. These include: Canes. Walkers. Scooters. Crutches. Turn on the lights when you go into a dark area. Replace any light bulbs as soon as they burn out. Set up your furniture so you have a clear path. Avoid moving your furniture around. If any of your floors are uneven, fix them. If there are any pets around you, be aware of where they are. Review your medicines with your doctor. Some medicines can make you feel dizzy. This can increase your chance of falling. Ask your doctor what other things that you can do to help prevent falls. This information is not intended to replace advice given to you by your health care provider. Make sure you discuss any questions you have with your health care provider. Document Released: 01/11/2009 Document Revised: 08/23/2015 Document Reviewed: 04/21/2014 Elsevier Interactive Patient Education  2017 [employer].

## 2022-07-23 NOTE — Progress Notes (Signed)
Subjective:   Amy Stokes is a 72 y.o. female who presents for Medicare Annual (Subsequent) preventive examination.  Review of Systems     Cardiac Risk Factors include: advanced age (>58men, >77 women);hypertension     Objective:    Today's Vitals   07/23/22 1033  BP: 120/60  Pulse: 70  Temp: 97.7 F (36.5 C)  TempSrc: Oral  SpO2: 96%  Weight: 289 lb 8 oz (131.3 kg)  Height: 5\' 8"  (1.727 m)   Body mass index is 44.02 kg/m.     07/23/2022   10:47 AM 04/03/2022   10:15 AM 12/28/2021    5:17 PM 09/10/2021    8:29 AM 07/23/2021    8:28 PM 02/25/2021   11:34 PM 02/25/2021    7:49 PM  Advanced Directives  Does Patient Have a Medical Advance Directive? No No No No No No No  Would patient like information on creating a medical advance directive? No - Patient declined No - Patient declined  No - Patient declined No - Patient declined No - Patient declined     Current Medications (verified) Outpatient Encounter Medications as of 07/23/2022  Medication Sig   acetaminophen (TYLENOL) 650 MG CR tablet Take 1,300 mg by mouth every 8 (eight) hours as needed for pain.   albuterol (VENTOLIN HFA) 108 (90 Base) MCG/ACT inhaler Inhale 2 puffs into the lungs every 6 (six) hours as needed for wheezing or shortness of breath.   apixaban (ELIQUIS) 5 MG TABS tablet TAKE 1 TABLET BY MOUTH TWICE A DAY   Budeson-Glycopyrrol-Formoterol (BREZTRI AEROSPHERE) 160-9-4.8 MCG/ACT AERO Inhale 2 puffs into the lungs in the morning and at bedtime. with spacer and rinse mouth afterwards.   Cholecalciferol 50 MCG (2000 UT) CAPS Take 1 capsule by mouth daily. PATIENT USING OIL AND NOT CAPSULES   diltiazem (CARDIZEM CD) 300 MG 24 hr capsule TAKE 1 CAPSULE BY MOUTH EVERY DAY   fexofenadine (ALLEGRA) 180 MG tablet TAKE 1 TABLET BY MOUTH EVERY DAY   flecainide (TAMBOCOR) 50 MG tablet TAKE 1 TABLET BY MOUTH TWICE A DAY   hydrochlorothiazide (HYDRODIURIL) 25 MG tablet TAKE 1 TABLET (25 MG TOTAL) BY MOUTH DAILY.    ipratropium-albuterol (DUONEB) 0.5-2.5 (3) MG/3ML SOLN Take 3 mLs by nebulization every 6 (six) hours as needed.   KLOR-CON M10 10 MEQ tablet TAKE 1 TABLET (10 MEQ TOTAL) BY MOUTH 3 (THREE) TIMES DAILY.   levothyroxine (SYNTHROID) 100 MCG tablet TAKE 1 TABLET BY MOUTH EVERY MORNING ON AN EMPTY STOMACH Orally Once a day 90 days   losartan (COZAAR) 100 MG tablet TAKE 1 TABLET BY MOUTH EVERY DAY   Lutein 10 MG TABS Take 10 mg by mouth daily.   montelukast (SINGULAIR) 10 MG tablet Take 1 tablet (10 mg total) by mouth at bedtime.   MULTIPLE VITAMIN PO Take 1 tablet by mouth daily.   Multiple Vitamins-Minerals (PRESERVISION AREDS 2 PO) Take 1 tablet by mouth in the morning and at bedtime.   pantoprazole (PROTONIX) 40 MG tablet TAKE 1 TABLET BY MOUTH EVERY DAY   rosuvastatin (CRESTOR) 10 MG tablet Take 1 tablet (10 mg total) by mouth daily.   Spacer/Aero-Holding Rudean Curt Use with spacer   valACYclovir (VALTREX) 500 MG tablet As needed   No facility-administered encounter medications on file as of 07/23/2022.    Allergies (verified) Patient has no known allergies.   History: Past Medical History:  Diagnosis Date   Arthritis    no meds   Asthma  Edema    Fibroids    Hyperlipidemia    Hypertension    Obesity    RML pneumonia 02/25/2021   Sleep apnea    wears cpap    Thyroid nodule 01/2009   left discovered on chest ct; needle bx "indeterminant" cannon r/o follicular neoplasm    Past Surgical History:  Procedure Laterality Date   ABDOMINAL HYSTERECTOMY Bilateral 08/02/2013   Procedure: TOTAL ABDOMINAL HYSTERECTOMY WITH BILATERAL SALPINGO-OOPHORECTOMY ;  Surgeon: Ok Edwards, MD;  Location: [facility];  Service: Gynecology;  Laterality: Bilateral;    Dr. Audie Box to assist.  Dr. Abbey Chatters to follow with an umbilical hernia repair. He will need an additional 1 1/2 hours.    COLONOSCOPY  12/27/2007   [facility]    HERNIA REPAIR     MOUTH SURGERY     thyroid needle bx      TONSILLECTOMY     UMBILICAL HERNIA REPAIR N/A 08/02/2013   Procedure: HERNIA REPAIR UMBILICAL ADULT WITH MESH;  Surgeon: Adolph Pollack, MD;  Location: [facility];  Service: General;  Laterality: N/A;   Family History  Problem Relation Age of Onset   Arthritis Mother    Other Mother        neurological disorder   Pancreatic cancer Mother    Heart disease Father    Thyroid nodules Sister    Heart disease Brother        x2 1 brother deceased   Colon cancer Neg Hx    Colon polyps Neg Hx    Esophageal cancer Neg Hx    Rectal cancer Neg Hx    Stomach cancer Neg Hx    Social History   Socioeconomic History   Marital status: Married    Spouse name: Not on file   Number of children: 0   Years of education: Not on file   Highest education level: Associate degree: occupational, Scientist, product/process development, or vocational program  Occupational History   Occupation: retired  Tobacco Use   Smoking status: Former    Packs/day: 1.00    Years: 1.00    Additional pack years: 0.00    Total pack years: 1.00    Types: Cigarettes    Quit date: 04/29/1971    Years since quitting: 51.2    Passive exposure: Never   Smokeless tobacco: Never  Vaping Use   Vaping Use: Never used  Substance and Sexual Activity   Alcohol use: Not Currently    Comment: occasionally   Drug use: No   Sexual activity: Not on file  Other Topics Concern   Not on file  Social History Narrative   Married   Occupation: Advertising account executive currently unemployed   Married   Regular exercise-no   HH of 2   Pet cat      Social Determinants of Health   Financial Resource Strain: Low Risk  (07/23/2022)   Overall Financial Resource Strain (CARDIA)    Difficulty of Paying Living Expenses: Not hard at all  Food Insecurity: No Food Insecurity (07/23/2022)   Hunger Vital Sign    Worried About Running Out of Food in the Last Year: Never true    Ran Out of Food in the Last Year: Never true  Transportation Needs: No Transportation Needs  (07/23/2022)   PRAPARE - Administrator, Civil Service (Medical): No    Lack of Transportation (Non-Medical): No  Physical Activity: Sufficiently Active (07/23/2022)   Exercise Vital Sign    Days of Exercise per Week: 3  days    Minutes of Exercise per Session: 60 min  Stress: No Stress Concern Present (07/23/2022)   [employer] of Occupational Health - Occupational Stress Questionnaire    Feeling of Stress : Not at all  Social Connections: Moderately Integrated (07/23/2022)   Social Connection and Isolation Panel [NHANES]    Frequency of Communication with Friends and Family: More than three times a week    Frequency of Social Gatherings with Friends and Family: More than three times a week    Attends Religious Services: Never    Database administrator or Organizations: Yes    Attends Engineer, structural: More than 4 times per year    Marital Status: Married    Tobacco Counseling Counseling given: Not Answered   Clinical Intake:  Pre-visit preparation completed: Yes  Pain : No/denies pain     BMI - recorded: 44.02 Nutritional Status: BMI > 30  Obese Nutritional Risks: None Diabetes: No  How often do you need to have someone help you when you read instructions, pamphlets, or other written materials from your doctor or pharmacy?: 1 - Never  Diabetic?  No  Interpreter Needed?: No  Information entered by :: Theresa Mulligan LPN   Activities of Daily Living    07/23/2022   10:45 AM 07/20/2022    4:50 PM  In your present state of health, do you have any difficulty performing the following activities:  Hearing? 0 0  Vision? 0 0  Difficulty concentrating or making decisions? 0 0  Walking or climbing stairs? 0 0  Dressing or bathing? 0 0  Doing errands, shopping? 0 0  Preparing Food and eating ? N N  Using the Toilet? N N  In the past six months, have you accidently leaked urine? Malvin Johns  Comment Wears pads. Followed by Medical attention   Do you  have problems with loss of bowel control? N N  Managing your Medications? N N  Managing your Finances? N N  Housekeeping or managing your Housekeeping? N N    Patient Care Team: Panosh, Neta Mends, MD as PCP - General Elberta Fortis, Andree Coss, MD as PCP - Electrophysiology (Cardiology) Jodelle Red, MD as PCP - Cardiology (Cardiology) Dorisann Frames, MD as Consulting Physician (Endocrinology) Tomma Lightning, MD as Consulting Physician (Pulmonary Disease) Zelphia Cairo, MD as Consulting Physician (Obstetrics and Gynecology) Verner Chol, [facility] (Inactive) as Pharmacist (Pharmacist) Ellamae Sia, DO as Consulting Physician (Allergy)  Indicate any recent Medical Services you may have received from other than [facility] providers in the past year (date may be approximate).     Assessment:   This is a routine wellness examination for Bonnee.  Hearing/Vision screen Hearing Screening - Comments:: Denies hearing difficulties   Vision Screening - Comments:: Wears rx glasses - up to date with routine eye exams with  Dr Bascom Levels  Dietary issues and exercise activities discussed: Exercise limited by: None identified   Goals Addressed               This Visit's Progress     No current goals (pt-stated)         Depression Screen    07/23/2022   10:33 AM 01/10/2022    8:18 AM 11/12/2021   10:08 AM 09/10/2021    8:30 AM 05/07/2021   11:01 AM 08/28/2020    8:16 AM 05/14/2020    3:10 PM  PHQ 2/9 Scores  PHQ - 2 Score 0 0 0 0 0 0 0  PHQ- 9 Score  0 0  0      Fall Risk    07/23/2022   10:47 AM 07/20/2022    4:50 PM 01/10/2022    8:18 AM 11/12/2021   10:09 AM 09/10/2021    8:30 AM  Fall Risk   Falls in the past year? 0 0 0 0 0  Number falls in past yr: 0 0 0 0 0  Injury with Fall? 0 0 0 0 0  Risk for fall due to : No Fall Risks  No Fall Risks No Fall Risks Medication side effect  Follow up Falls prevention discussed  Falls evaluation completed Falls evaluation completed Falls  evaluation completed;Education provided;Falls prevention discussed    FALL RISK PREVENTION PERTAINING TO THE HOME:  Any stairs in or around the home? Yes  If so, are there any without handrails? No  Home free of loose throw rugs in walkways, pet beds, electrical cords, etc? Yes  Adequate lighting in your home to reduce risk of falls? Yes   ASSISTIVE DEVICES UTILIZED TO PREVENT FALLS:  Life alert? No  Use of a cane, walker or w/c? No  Grab bars in the bathroom? Yes Shower chair or bench in shower? No  Elevated toilet seat or a handicapped toilet? No   TIMED UP AND GO:  Was the test performed? Yes .  Length of time to ambulate 10 feet: 10 sec.   Gait steady and fast without use of assistive device  Cognitive Function:        07/23/2022   10:47 AM 09/10/2021    8:32 AM 08/28/2020    8:21 AM  6CIT Screen  What Year? 0 points 0 points 0 points  What month? 0 points 0 points 0 points  What time? 0 points 0 points 0 points  Count back from 20 0 points 0 points 0 points  Months in reverse 0 points 0 points 0 points  Repeat phrase 2 points 0 points 2 points  Total Score 2 points 0 points 2 points    Immunizations Immunization History  Administered Date(s) Administered   COVID-19, mRNA, vaccine(Comirnaty)12 years and older 12/17/2021   Fluad Quad(high Dose 65+) 12/07/2019, 01/08/2021, 12/17/2021   Influenza Split 11/04/2012, 01/04/2014   Influenza, High Dose Seasonal PF 11/06/2016, 01/26/2018, 02/16/2019   Influenza,inj,Quad PF,6+ Mos 04/19/2015, 12/27/2015   Influenza-Unspecified 11/06/2016, 12/07/2019, 12/17/2021, 12/17/2021   Moderna Covid-19 Vaccine Bivalent Booster 65yrs & up 12/17/2021   PFIZER Comirnaty(Gray Top)Covid-19 Tri-Sucrose Vaccine 07/10/2020, 12/17/2021   PFIZER(Purple Top)SARS-COV-2 Vaccination 11/18/2018, 12/15/2018, 11/03/2019   Pfizer Covid-19 Vaccine Bivalent Booster 39yrs & up 01/08/2021   Pneumococcal Conjugate-13 04/03/2016   Pneumococcal  Polysaccharide-23 01/31/2010, 04/11/2020   Tdap 07/28/2013, 12/28/2021   Zoster Recombinat (Shingrix) 06/13/2017, 09/27/2017    TDAP status: Up to date  Flu Vaccine status: Up to date  Pneumococcal vaccine status: Up to date  Covid-19 vaccine status: Completed vaccines  Qualifies for Shingles Vaccine? Yes   Zostavax completed Yes   Shingrix Completed?: Yes  Screening Tests Health Maintenance  Topic Date Due   PAP SMEAR-Modifier  09/29/2015   COVID-19 Vaccine (7 - 2023-24 season) 08/08/2022 (Originally 02/11/2022)   Hepatitis C Screening  07/23/2023 (Originally 03/30/1969)   INFLUENZA VACCINE  10/30/2022   MAMMOGRAM  02/27/2023   Medicare Annual Wellness (AWV)  07/23/2023   COLONOSCOPY (Pts 45-72yrs Insurance coverage will need to be confirmed)  05/28/2028   DTaP/Tdap/Td (3 - Td or Tdap) 12/29/2031   Pneumonia Vaccine 43+ Years old  Completed   DEXA SCAN  Completed   Zoster Vaccines- Shingrix  Completed   HPV VACCINES  Aged Out    Health Maintenance  Health Maintenance Due  Topic Date Due   PAP SMEAR-Modifier  09/29/2015    Colorectal cancer screening: Type of screening: Colonoscopy. Completed 05/28/18. Repeat every 10 years  Mammogram status: Completed 02/26/22. Repeat every year  Bone Density status: Completed 10/19/14. Results reflect: Bone density results: NORMAL. Repeat every   years.  Lung Cancer Screening: (Low Dose CT Chest recommended if Age 72-80 years, 30 pack-year currently smoking OR have quit w/in 15years.) does not qualify.     Additional Screening:  Hepatitis C Screening: does qualify; Deferred  Vision Screening: Recommended annual ophthalmology exams for early detection of glaucoma and other disorders of the eye. Is the patient up to date with their annual eye exam?  Yes  Who is the provider or what is the name of the office in which the patient attends annual eye exams? Dr Bascom Levels If pt is not established with a provider, would they like to be  referred to a provider to establish care? No .   Dental Screening: Recommended annual dental exams for proper oral hygiene  Community Resource Referral / Chronic Care Management:  CRR required this visit?  No   CCM required this visit?  No      Plan:     I have personally reviewed and noted the following in the patient's chart:   Medical and social history Use of alcohol, tobacco or illicit drugs  Current medications and supplements including opioid prescriptions. Patient is not currently taking opioid prescriptions. Functional ability and status Nutritional status Physical activity Advanced directives List of other physicians Hospitalizations, surgeries, and ER visits in previous 12 months Vitals Screenings to include cognitive, depression, and falls Referrals and appointments  In addition, I have reviewed and discussed with patient certain preventive protocols, quality metrics, and best practice recommendations. A written personalized care plan for preventive services as well as general preventive health recommendations were provided to patient.     Tillie Rung, LPN   1/61/0960   Nurse Notes:  Patient due Hep-C Screening

## 2022-07-30 DIAGNOSIS — R69 Illness, unspecified: Secondary | ICD-10-CM | POA: Diagnosis not present

## 2022-07-31 ENCOUNTER — Other Ambulatory Visit: Payer: Self-pay | Admitting: Family

## 2022-07-31 ENCOUNTER — Other Ambulatory Visit (HOSPITAL_BASED_OUTPATIENT_CLINIC_OR_DEPARTMENT_OTHER): Payer: Self-pay | Admitting: Family

## 2022-07-31 NOTE — Telephone Encounter (Signed)
Rx(s) sent to pharmacy electronically.  

## 2022-08-01 ENCOUNTER — Other Ambulatory Visit: Payer: Self-pay | Admitting: Cardiology

## 2022-08-01 ENCOUNTER — Other Ambulatory Visit: Payer: Self-pay | Admitting: Internal Medicine

## 2022-08-03 DIAGNOSIS — G4733 Obstructive sleep apnea (adult) (pediatric): Secondary | ICD-10-CM | POA: Diagnosis not present

## 2022-08-03 DIAGNOSIS — Z9989 Dependence on other enabling machines and devices: Secondary | ICD-10-CM | POA: Diagnosis not present

## 2022-08-05 ENCOUNTER — Telehealth: Payer: Self-pay

## 2022-08-05 NOTE — Progress Notes (Signed)
Care Management & Coordination Services Pharmacy Team  Reason for Encounter: Appointment Reminder  Contacted patient to confirm telephone appointment with Delano Metz, PharmD on 08/06/2022 at 1:00. Spoke with patient on 08/05/2022   Do you have any problems getting your medications? No  What is your top health concern you would like to discuss at your upcoming visit? Patient denies  Have you seen any other providers since your last visit with PCP? No  Care Gaps: AWV - completed 07/23/2022 Pap smear - overdue Covid - postponed Hep C Screen - postponed   Star Rating Drug: Losartan 100 mg - last [facility] Rosuvastatin 10 mg - last filled [facility]/31/2022 [facility]   Inetta Fermo [facility]  Clinical Pharmacist Assistant [phone]

## 2022-08-05 NOTE — Progress Notes (Signed)
Care Management & Coordination Services Pharmacy Note  08/05/2022 Name:  GLENDA Stokes MRN:  [id number] DOB:  June 01, 1950  Summary: BP at goal less than 130/80 LDL at goal <100  Recommendations/Changes made from today's visit: -Counseled to continue to check BP/HR daily and keep a log -Counseled to continue to follow a cholesterol-mindful diet  Follow up plan: BP call in 3 months Pharmacist visit in 6-7 months   Subjective: Amy Stokes is an 72 y.o. year old female who is a primary patient of Panosh, Amy Mends, MD.  The care coordination team was consulted for assistance with disease management and care coordination needs.    Engaged with patient by telephone for follow up visit.  Recent office visits: 07/23/22 Amy Mulligan, LPN - For AWV, no medication changes  02/27/2022 Amy Andreas MD - Patient was seen for essential hypertension and additional concerns.No additional chart notes.    Recent consult visits: 05/19/22 Amy Stokes, PT - For lack of coordination, no medication changes  03/25/2022 Amy Cairo MD (GYN) - Patient was seen for Gynecologic examination. No additional chart notes.    02/28/2022 Amy Red MD (cardiology) - Patient was seen for essential hypertension and additional concerns. Discontinued Flonase.  Hospital visits: None in previous 6 months   Objective:  Lab Results  Component Value Date   CREATININE 0.80 11/29/2021   BUN 17 11/29/2021   GFR 74.52 11/29/2021   EGFR 94 09/10/2021   GFRNONAA >60 02/28/2021   GFRAA 95 05/10/2008   NA 138 11/29/2021   K 3.7 11/29/2021   CALCIUM 9.1 11/29/2021   CO2 35 (H) 11/29/2021   GLUCOSE 96 11/29/2021    Lab Results  Component Value Date/Time   HGBA1C 6.0 11/12/2021 10:43 AM   HGBA1C 5.7 (A) 10/24/2020 12:35 PM   HGBA1C 6.0 06/10/2019 10:16 AM   GFR 74.52 11/29/2021 07:53 AM   GFR 88.12 11/12/2021 10:43 AM    Last diabetic Eye exam: No results found for:  "HMDIABEYEEXA"  Last diabetic Foot exam: No results found for: "HMDIABFOOTEX"   Lab Results  Component Value Date   CHOL 134 11/07/2021   HDL 48 11/07/2021   LDLCALC 76 11/07/2021   LDLDIRECT 112 (H) 01/10/2021   TRIG 42 11/07/2021   CHOLHDL 2.8 11/07/2021       Latest Ref Rng & Units 11/07/2021    8:14 AM 09/10/2021    9:36 AM 02/26/2021    4:21 AM  Hepatic Function  Total Protein 6.0 - 8.5 g/dL 6.4  6.5  7.2   Albumin 3.9 - 4.9 g/dL 3.9  3.7  3.1   AST 0 - 40 IU/L 13  13  15    ALT 0 - 32 IU/L 12  13  18    Alk Phosphatase 44 - 121 IU/L 103  104  72   Total Bilirubin 0.0 - 1.2 mg/dL 0.5  0.7  0.4   Bilirubin, Direct 0.00 - 0.40 mg/dL 2.84       Lab Results  Component Value Date/Time   TSH 2.00 10/12/2020 12:00 AM   TSH 2.21 06/10/2019 10:16 AM   TSH 1.95 11/04/2017 11:56 AM   FREET4 0.82 04/19/2015 09:47 AM   FREET4 1.0 07/16/2009 03:14 PM       Latest Ref Rng & Units 12/25/2021   11:10 AM 11/12/2021   10:43 AM 06/04/2021   10:55 AM  CBC  WBC 4.0 - 10.5 K/uL 7.7  7.0  6.6   Hemoglobin 12.0 - 15.0 g/dL 12.5  12.5  12.6   Hematocrit 36.0 - 46.0 % 37.9  38.9  38.2   Platelets 150.0 - 400.0 K/uL 248.0  253.0  251.0     Lab Results  Component Value Date/Time   VD25OH 38.91 04/19/2015 09:47 AM    Clinical ASCVD: No  The 10-year ASCVD risk score (Arnett DK, et al., 2019) is: 11.4%   Values used to calculate the score:     Age: 46 years     Sex: Female     Is Non-Hispanic African American: No     Diabetic: No     Tobacco smoker: No     Systolic Blood Pressure: 120 mmHg     Is BP treated: Yes     HDL Cholesterol: 48 mg/dL     Total Cholesterol: 134 mg/dL        07/07/8117   14:78 AM 01/10/2022    8:18 AM 11/12/2021   10:08 AM  Depression screen PHQ 2/9  Decreased Interest 0 0 0  Down, Depressed, Hopeless 0 0 0  PHQ - 2 Score 0 0 0  Altered sleeping  0 0  Tired, decreased energy  0 0  Change in appetite  0 0  Feeling bad or failure about yourself   0 0   Trouble concentrating  0 0  Moving slowly or fidgety/restless  0 0  Suicidal thoughts  0 0  PHQ-9 Score  0 0  Difficult doing work/chores  Not difficult at all      Social History   Tobacco Use  Smoking Status Former   Packs/day: 1.00   Years: 1.00   Additional pack years: 0.00   Total pack years: 1.00   Types: Cigarettes   Quit date: 04/29/1971   Years since quitting: 51.3   Passive exposure: Never  Smokeless Tobacco Never   BP Readings from Last 3 Encounters:  07/23/22 120/60  02/28/22 109/61  02/05/22 138/68   Pulse Readings from Last 3 Encounters:  07/23/22 70  02/28/22 71  02/05/22 72   Wt Readings from Last 3 Encounters:  07/23/22 289 lb 8 oz (131.3 kg)  02/28/22 286 lb 12.8 oz (130.1 kg)  02/05/22 288 lb (130.6 kg)   BMI Readings from Last 3 Encounters:  07/23/22 44.02 kg/m  02/28/22 43.61 kg/m  02/05/22 43.79 kg/m    No Known Allergies  Medications Reviewed Today     Reviewed by Tillie Rung, LPN (Licensed Practical Nurse) on 07/23/22 at 1019  Med List Status: <None>   Medication Order Taking? Sig Documenting Provider Last Dose Status Informant  acetaminophen (TYLENOL) 650 MG CR tablet [id number] No Take 1,300 mg by mouth every 8 (eight) hours as needed for pain. [provider] Taking Active Self  albuterol (VENTOLIN HFA) 108 (90 Base) MCG/ACT inhaler [id number] No Inhale 2 puffs into the lungs every 6 (six) hours as needed for wheezing or shortness of breath. [provider] Taking Active   apixaban (ELIQUIS) 5 MG TABS tablet [id number]  TAKE 1 TABLET BY MOUTH TWICE A DAY Amy Red, MD  Active   Budeson-Glycopyrrol-Formoterol (BREZTRI AEROSPHERE) 160-9-4.8 MCG/ACT Sandrea Matte [id number]  Inhale 2 puffs into the lungs in the morning and at bedtime. with spacer and rinse mouth afterwards. Ellamae Sia, DO  Active   Cholecalciferol 50 MCG (2000 UT) CAPS [id number] No Take 1 capsule by mouth daily. PATIENT USING OIL AND NOT  CAPSULES [provider] Taking Active Self  diltiazem (CARDIZEM CD) 300 MG 24 hr capsule [id number]  TAKE 1 CAPSULE BY MOUTH EVERY DAY Amy Red, MD  Active   fexofenadine (ALLEGRA) 180 MG tablet [id number]  TAKE 1 TABLET BY MOUTH EVERY DAY Cobb, Ruby Cola, NP  Active   flecainide (TAMBOCOR) 50 MG tablet [id number] No TAKE 1 TABLET BY MOUTH TWICE A DAY Camnitz, Will Daphine Deutscher, MD Taking Active   hydrochlorothiazide (HYDRODIURIL) 25 MG tablet [id number] No TAKE 1 TABLET (25 MG TOTAL) BY MOUTH DAILY. Panosh, Amy Mends, MD Taking Active   ipratropium-albuterol (DUONEB) 0.5-2.5 (3) MG/3ML SOLN [id number] No Take 3 mLs by nebulization every 6 (six) hours as needed. Rolly Salter, MD Taking Active   KLOR-CON M10 10 MEQ tablet [id number]  TAKE 1 TABLET (10 MEQ TOTAL) BY MOUTH 3 (THREE) TIMES DAILY. Worthy Rancher B, FNP  Active   levothyroxine (SYNTHROID) 100 MCG tablet [id number] No TAKE 1 TABLET BY MOUTH EVERY MORNING ON AN EMPTY STOMACH Orally Once a day 90 days  Taking Active   losartan (COZAAR) 100 MG tablet [id number]  TAKE 1 TABLET BY MOUTH EVERY DAY Worthy Rancher B, FNP  Active   Lutein 10 MG TABS [id number] No Take 10 mg by mouth daily. [provider] Taking Active Self  montelukast (SINGULAIR) 10 MG tablet [id number] No Take 1 tablet (10 mg total) by mouth at bedtime. Ellamae Sia, DO Taking Active   MULTIPLE VITAMIN PO [id number] No Take 1 tablet by mouth daily. [provider] Taking Active Self  Multiple Vitamins-Minerals (PRESERVISION AREDS 2 PO) [id number] No Take 1 tablet by mouth in the morning and at bedtime. [provider] Taking Active   pantoprazole (PROTONIX) 40 MG tablet [id number]  TAKE 1 TABLET BY MOUTH EVERY DAY Olalere, Adewale A, MD  Active   rosuvastatin (CRESTOR) 10 MG tablet [id number] No Take 1 tablet (10 mg total) by mouth daily. Alver Sorrow, NP Taking Active   Spacer/Aero-Holding Chambers DEVI [id number] No Use with spacer Allison Quarry  Ruby Cola, NP Taking Active   valACYclovir (VALTREX) 500 MG tablet [id number] No As needed [provider] Taking Active             SDOH:  (Social Determinants of Health) assessments and interventions performed: Yes SDOH Interventions    Flowsheet Row Clinical Support from 07/23/2022 in [facility] [place] Chronic Care Management from 02/05/2022 in [facility] [place] Chronic Care Management from 01/09/2020 in [facility] [place]  SDOH Interventions     Food Insecurity Interventions Intervention Not Indicated -- --  Housing Interventions Intervention Not Indicated -- --  Transportation Interventions Intervention Not Indicated -- Intervention Not Indicated  Utilities Interventions Intervention Not Indicated -- --  Alcohol Usage Interventions Intervention Not Indicated (Score <7) -- --  Financial Strain Interventions Intervention Not Indicated Other (Comment)  [patient approved for assistance] Intervention Not Indicated  Physical Activity Interventions Intervention Not Indicated -- --  Stress Interventions Intervention Not Indicated -- --  Social Connections Interventions Intervention Not Indicated -- --       Medication Assistance: None required.  Patient affirms current coverage meets needs.  Medication Access: Within the past 30 days, how often has patient missed a dose of medication? None Is a pillbox or other method used to improve adherence? Yes  Factors that may affect medication adherence? no barriers identified Are meds synced by current pharmacy? No  Are meds delivered by current pharmacy? No  Does patient experience delays in picking up medications due to transportation concerns? No  [facility] Services Reviewed: Is patient disadvantaged to use [facility]?: Yes  Current Rx insurance plan: [employer] Name and location of Current pharmacy:  [id number] [place], [place] - [address] [place] [address] Phone: [phone] Fax: [phone]  [facility] [address] [place] [address] Phone: [phone] Fax: [phone]  [facility] - [place], [place] - [address] [place] [address] Phone: [phone] Fax: [phone]  [facility] [id number] [place], [place] - [address] [place] [address] Phone: [phone] Fax: [phone]  [facility] services reviewed with patient today?: No  Patient requests to transfer care to [facility]?: No  Reason patient declined to change pharmacies: Disadvantaged due to insurance/mail order  Compliance/Adherence/Medication fill history: Care Gaps: PAP Smear  Star-Rating Drugs: Losartan 100mg  PDC 100% Rosuvastatin 10mg  PDC 10%   Assessment/Plan Hypertension (BP goal <130/80) -Controlled -Current treatment: HCTZ 25 mg 1 tab qd Appropriate, Effective, Safe, Accessible Losartan 100mg  1 qd Appropriate, Effective, Safe, Accessible -Medications previously tried: None  -Current home readings: 118/62 HR 67 today at home, 118/56 HR 64 -Current dietary habits: mindful of salt intake -Current exercise habits: not discussed -Denies hypotensive/hypertensive symptoms -Educated on BP goals and benefits of medications for prevention of heart attack, stroke and kidney damage; Daily salt intake goal < 2300 mg; Importance of home blood pressure monitoring; Proper BP monitoring technique; -Counseled to monitor BP at home weekly, document, and provide log at future appointments -Recommended to continue current medication  Hyperlipidemia: (LDL goal < 100) -Controlled -Current treatment: Rosuvastatin 10mg  1 qd Appropriate, Effective, Safe, Accessible -Medications previously tried: None  -Current dietary patterns: mindful of limited fried foods -Current exercise  habits: not discussed -Educated on Cholesterol goals;  Benefits of statin for ASCVD risk reduction; Importance of limiting foods high in cholesterol; -Recommended to continue current medication  Sherrill Raring Clinical Pharmacist [phone]

## 2022-08-06 ENCOUNTER — Ambulatory Visit: Payer: Medicare HMO

## 2022-08-08 ENCOUNTER — Encounter: Payer: Self-pay | Admitting: Cardiology

## 2022-08-08 ENCOUNTER — Ambulatory Visit: Payer: Medicare HMO | Attending: Cardiology | Admitting: Cardiology

## 2022-08-08 VITALS — BP 136/68 | HR 77 | Ht 68.0 in | Wt 293.0 lb

## 2022-08-08 DIAGNOSIS — Z79899 Other long term (current) drug therapy: Secondary | ICD-10-CM | POA: Diagnosis not present

## 2022-08-08 DIAGNOSIS — D6869 Other thrombophilia: Secondary | ICD-10-CM

## 2022-08-08 DIAGNOSIS — I480 Paroxysmal atrial fibrillation: Secondary | ICD-10-CM

## 2022-08-08 NOTE — Patient Instructions (Signed)
Medication Instructions:  Your physician recommends that you continue on your current medications as directed. Please refer to the Current Medication list given to you today.  *If you need a refill on your cardiac medications before your next appointment, please call your pharmacy*   Lab Work: None ordered   Testing/Procedures: None ordered   Follow-Up: At [facility], you and your health needs are our priority.  As part of our continuing mission to provide you with exceptional heart care, we have created designated Provider Care Teams.  These Care Teams include your primary Cardiologist (physician) and Advanced Practice Providers (APPs -  Physician Assistants and Nurse Practitioners) who all work together to provide you with the care you need, when you need it.  Your next appointment:   1 year(s)  The format for your next appointment:   In Person  Provider:   You will see one of the following Advanced Practice Providers on your designated Care Team:   Francis Dowse, Eston Mould" [place], [place] Sherie Don, NP  Thank you for choosing [facility]!!   Dory Horn, RN [phone]

## 2022-08-08 NOTE — Progress Notes (Signed)
Electrophysiology Office Note   Date:  08/08/2022   ID:  Amy Stokes, DOB Jul 27, 1950, MRN [id number]  PCP:  Madelin Headings, MD  Cardiologist:  Cristal Deer Primary Electrophysiologist:  Aarianna Hoadley Jorja Loa, MD    Chief Complaint: AF   History of Present Illness: Amy Stokes is a 72 y.o. female who is being seen today for the evaluation of AF at the request of Panosh, Neta Mends, MD. Presenting today for electrophysiology evaluation.  She has a history stated for atrial fibrillation, hypertension, asthma, OSA, morbid obesity.  She feels short of breath and atrial fibrillation.  She is now on flecainide.  Today, denies symptoms of palpitations, chest pain, shortness of breath, orthopnea, PND, lower extremity edema, claudication, dizziness, presyncope, syncope, bleeding, or neurologic sequela. The patient is tolerating medications without difficulties.  Feeling well.  She does bring in recordings of atrial fibrillation.  She is having rare episodes that are all short-lived.    Past Medical History:  Diagnosis Date   Arthritis    no meds   Asthma    Edema    Fibroids    Hyperlipidemia    Hypertension    Obesity    RML pneumonia 02/25/2021   Sleep apnea    wears cpap    Thyroid nodule 01/2009   left discovered on chest ct; needle bx "indeterminant" cannon r/o follicular neoplasm    Past Surgical History:  Procedure Laterality Date   ABDOMINAL HYSTERECTOMY Bilateral 08/02/2013   Procedure: TOTAL ABDOMINAL HYSTERECTOMY WITH BILATERAL SALPINGO-OOPHORECTOMY ;  Surgeon: Ok Edwards, MD;  Location: [facility];  Service: Gynecology;  Laterality: Bilateral;    Dr. Audie Box to assist.  Dr. Abbey Chatters to follow with an umbilical hernia repair. He Rise Traeger need an additional 1 1/2 hours.    COLONOSCOPY  12/27/2007   [facility]    HERNIA REPAIR     MOUTH SURGERY     thyroid needle bx     TONSILLECTOMY     UMBILICAL HERNIA REPAIR N/A 08/02/2013   Procedure: HERNIA REPAIR  UMBILICAL ADULT WITH MESH;  Surgeon: Adolph Pollack, MD;  Location: [facility];  Service: General;  Laterality: N/A;     Current Outpatient Medications  Medication Sig Dispense Refill   acetaminophen (TYLENOL) 650 MG CR tablet Take 1,300 mg by mouth every 8 (eight) hours as needed for pain.     albuterol (VENTOLIN HFA) 108 (90 Base) MCG/ACT inhaler Inhale 2 puffs into the lungs every 6 (six) hours as needed for wheezing or shortness of breath.     apixaban (ELIQUIS) 5 MG TABS tablet TAKE 1 TABLET BY MOUTH TWICE A DAY 60 tablet 5   Budeson-Glycopyrrol-Formoterol (BREZTRI AEROSPHERE) 160-9-4.8 MCG/ACT AERO Inhale 2 puffs into the lungs in the morning and at bedtime. with spacer and rinse mouth afterwards. 10.7 g 5   Cholecalciferol 50 MCG (2000 UT) CAPS Take 1 capsule by mouth daily. PATIENT USING OIL AND NOT CAPSULES     diltiazem (CARDIZEM CD) 300 MG 24 hr capsule TAKE 1 CAPSULE BY MOUTH EVERY DAY 90 capsule 2   fexofenadine (ALLEGRA) 180 MG tablet TAKE 1 TABLET BY MOUTH EVERY DAY 30 tablet 5   flecainide (TAMBOCOR) 50 MG tablet TAKE 1 TABLET BY MOUTH TWICE A DAY 180 tablet 1   hydrochlorothiazide (HYDRODIURIL) 25 MG tablet TAKE 1 TABLET (25 MG TOTAL) BY MOUTH DAILY. 90 tablet 1   ipratropium-albuterol (DUONEB) 0.5-2.5 (3) MG/3ML SOLN Take 3 mLs by nebulization every 6 (six) hours as needed.  360 mL 0   KLOR-CON M10 10 MEQ tablet TAKE 1 TABLET (10 MEQ TOTAL) BY MOUTH 3 (THREE) TIMES DAILY. 270 tablet 0   levothyroxine (SYNTHROID) 100 MCG tablet TAKE 1 TABLET BY MOUTH EVERY MORNING ON AN EMPTY STOMACH Orally Once a day 90 days 90 tablet 4   losartan (COZAAR) 100 MG tablet TAKE 1 TABLET BY MOUTH EVERY DAY 90 tablet 1   Lutein 10 MG TABS Take 10 mg by mouth daily.     montelukast (SINGULAIR) 10 MG tablet Take 1 tablet (10 mg total) by mouth at bedtime. 90 tablet 1   MULTIPLE VITAMIN PO Take 1 tablet by mouth daily.     Multiple Vitamins-Minerals (PRESERVISION AREDS 2 PO) Take 1 tablet by mouth in  the morning and at bedtime.     pantoprazole (PROTONIX) 40 MG tablet TAKE 1 TABLET BY MOUTH EVERY DAY 90 tablet 1   rosuvastatin (CRESTOR) 10 MG tablet TAKE 1 TABLET BY MOUTH EVERY DAY 90 tablet 1   Spacer/Aero-Holding Chambers DEVI Use with spacer 1 each 2   valACYclovir (VALTREX) 500 MG tablet As needed     No current facility-administered medications for this visit.    Allergies:   Patient has no known allergies.   Social History:  The patient  reports that she quit smoking about 51 years ago. Her smoking use included cigarettes. She has a 1.00 pack-year smoking history. She has never been exposed to tobacco smoke. She has never used smokeless tobacco. She reports that she does not currently use alcohol. She reports that she does not use drugs.   Family History:  The patient's family history includes Arthritis in her mother; Heart disease in her brother and father; Other in her mother; Pancreatic cancer in her mother; Thyroid nodules in her sister.   ROS:  Please see the history of present illness.   Otherwise, review of systems is positive for none.   All other systems are reviewed and negative.   PHYSICAL EXAM: VS:  BP 136/68   Pulse 77   Ht 5\' 8"  (1.727 m)   Wt 293 lb (132.9 kg)   SpO2 96%   BMI 44.55 kg/m  , BMI Body mass index is 44.55 kg/m. GEN: Well nourished, well developed, in no acute distress  HEENT: normal  Neck: no JVD, carotid bruits, or masses Cardiac: RRR; no murmurs, rubs, or gallops,no edema  Respiratory:  clear to auscultation bilaterally, normal work of breathing GI: soft, nontender, nondistended, + BS MS: no deformity or atrophy  Skin: warm and dry Neuro:  Strength and sensation are intact Psych: euthymic mood, full affect  EKG:  EKG is ordered today. Personal review of the ekg ordered shows sinus rhythm   Recent Labs: 11/07/2021: ALT 12 11/29/2021: BUN 17; Creatinine, Ser 0.80; Potassium 3.7; Sodium 138 12/25/2021: Hemoglobin 12.5; Platelets 248.0     Lipid Panel     Component Value Date/Time   CHOL 134 11/07/2021 0814   TRIG 42 11/07/2021 0814   HDL 48 11/07/2021 0814   CHOLHDL 2.8 11/07/2021 0814   CHOLHDL 4 07/02/2020 0805   VLDL 19.0 07/02/2020 0805   LDLCALC 76 11/07/2021 0814   LDLDIRECT 112 (H) 01/10/2021 1016   LDLDIRECT 141.7 04/03/2011 1043     Wt Readings from Last 3 Encounters:  08/08/22 293 lb (132.9 kg)  07/23/22 289 lb 8 oz (131.3 kg)  02/28/22 286 lb 12.8 oz (130.1 kg)      Other studies Reviewed: Additional studies/ records that  were reviewed today include: TTE 11/01/20  Review of the above records today demonstrates:   1. Left ventricular ejection fraction, by estimation, is 55 to 60%. The  left ventricle has normal function. The left ventricle has no regional  wall motion abnormalities. Left ventricular diastolic parameters are  indeterminate.   2. Right ventricular systolic function is normal. The right ventricular  size is mildly enlarged. Tricuspid regurgitation signal is inadequate for  assessing PA pressure.   3. The mitral valve is grossly normal. No evidence of mitral valve  regurgitation. No evidence of mitral stenosis.   4. The aortic valve is grossly normal. Aortic valve regurgitation is not  visualized. No aortic stenosis is present.   5. The inferior vena cava is dilated in size with >50% respiratory  variability, suggesting right atrial pressure of 8 mmHg.    ASSESSMENT AND PLAN:  1.  Paroxysmal atrial fibrillation: Currently on diltiazem, flecainide, Eliquis.  CHA2DS2-VASc of 3.  Overall low burden of atrial fibrillation.  She is happy with her control.  Alyona Romack continue with current management.  2.  Hypertension: Currently well-controlled  3.  Hyperlipidemia: Continue statin per primary cardiology  4.  Obstructive sleep apnea: CPAP compliance encouraged  5.  Morbid obesity: Lifestyle modification encouraged.  Has been referred to weight loss clinic. Body mass index is 44.55  kg/m.  6.  Secondary hypercoagulable state: Currently on Eliquis for atrial fibrillation  7.  High risk medication monitoring: Currently on flecainide.  QRS remains narrow.   Current medicines are reviewed at length with the patient today.   The patient does not have concerns regarding her medicines.  The following changes were made today: None  Labs/ tests ordered today include:  Orders Placed This Encounter  Procedures   EKG 12-Lead     Disposition:   FU 12 months  Signed, Alonzo Loving Jorja Loa, MD  08/08/2022 9:52 AM     [facility] [address] [place] [address] [phone] (office) [phone] (fax)

## 2022-08-12 NOTE — Progress Notes (Unsigned)
Follow Up Note  RE: Amy Stokes MRN: [id number] DOB: [birth year] Date of Office Visit: 08/13/2022  Referring provider: Madelin Headings, MD Primary care provider: Madelin Headings, MD  Chief Complaint: No chief complaint on file.  History of Present Illness: I had the pleasure of seeing Amy Stokes for a follow up visit at the [facility] on 08/12/2022. She is a 72 y.o. female, who is being followed for asthma, allergic rhinitis, GERD. Her previous allergy office visit was on 01/08/2022 with Dr. Selena Batten. Today is a regular follow up visit.  Not well controlled severe persistent asthma Past history - Diagnosed with asthma for 30 years.  Currently on Advair 230 mcg 2 puffs twice a day and uses albuterol as needed less than once a month however has daily symptoms.  Main triggers include dust.  Follows with pulmonology as well.  2023 blood work showed elevated eosinophils of 300 and IgE of 1409. 2023 spirometry showed severe restriction with no improvement FEV1 postbronchodilator treatment.  Clinically unchanged. Interim history - marginal improvement with Breztri.  Today's spirometry showed restriction - slight improvement from previous one.  Discussed various biologics and gave handouts. Patient would like to discuss with pulmonology prior to starting.  Follow up with pulmonology as scheduled.  Daily controller medication(s): Breztri 2 puffs with spacer and rinse mouth afterwards.  May use albuterol rescue inhaler 2 puffs or nebulizer every 4 to 6 hours as needed for shortness of breath, chest tightness, coughing, and wheezing. May use albuterol rescue inhaler 2 puffs 5 to 15 minutes prior to strenuous physical activities. Monitor frequency of use.  Get RSV vaccine - up to date with flu and Covid-19 booster.   Other allergic rhinitis Past history - No prior ENT evaluation. 2023 skin testing showed: Positive to mold, cockroach, dust mites.  Interim history - unchanged.  Some sneezing fits.  Continue environmental control measures as below. Use over the counter antihistamines such as Zyrtec (cetirizine), Claritin (loratadine), Allegra (fexofenadine), or Xyzal (levocetirizine) daily as needed. May take twice a day during allergy flares. May switch antihistamines every few months. Continue Singulair (montelukast) 10mg  daily at night. Use Flonase (fluticasone) nasal spray 1 spray per nostril twice a day as needed for nasal congestion.  Nasal saline spray (i.e., Simply Saline) or nasal saline lavage (i.e., NeilMed) is recommended as needed and prior to medicated nasal sprays.   Gastroesophageal reflux disease Wondering if she can stop her PPI. You may stop Protonix and monitor symptoms. If symptomatic then restart.   Return in about 3 months (around 04/10/2022).    Assessment and Plan: Amy Stokes is a 72 y.o. female with: No problem-specific Assessment & Plan notes found for this encounter.  No follow-ups on file.  No orders of the defined types were placed in this encounter.  Lab Orders  No laboratory test(s) ordered today    Diagnostics: Spirometry:  Tracings reviewed. Her effort: {Blank single:19197::"Good reproducible efforts.","It was hard to get consistent efforts and there is a question as to whether this reflects a maximal maneuver.","Poor effort, data can not be interpreted."} FVC: ***L FEV1: ***L, ***% predicted FEV1/FVC ratio: ***% Interpretation: {Blank single:19197::"Spirometry consistent with mild obstructive disease","Spirometry consistent with moderate obstructive disease","Spirometry consistent with severe obstructive disease","Spirometry consistent with possible restrictive disease","Spirometry consistent with mixed obstructive and restrictive disease","Spirometry uninterpretable due to technique","Spirometry consistent with normal pattern","No overt abnormalities noted given today's efforts"}.  Please see scanned spirometry results for  details.  Skin Testing: {Blank single:19197::"Select foods","Environmental  allergy panel","Environmental allergy panel and select foods","Food allergy panel","None","Deferred due to recent antihistamines use"}. *** Results discussed with patient/family.   Medication List:  Current Outpatient Medications  Medication Sig Dispense Refill   acetaminophen (TYLENOL) 650 MG CR tablet Take 1,300 mg by mouth every 8 (eight) hours as needed for pain.     albuterol (VENTOLIN HFA) 108 (90 Base) MCG/ACT inhaler Inhale 2 puffs into the lungs every 6 (six) hours as needed for wheezing or shortness of breath.     apixaban (ELIQUIS) 5 MG TABS tablet TAKE 1 TABLET BY MOUTH TWICE A DAY 60 tablet 5   Budeson-Glycopyrrol-Formoterol (BREZTRI AEROSPHERE) 160-9-4.8 MCG/ACT AERO Inhale 2 puffs into the lungs in the morning and at bedtime. with spacer and rinse mouth afterwards. 10.7 g 5   Cholecalciferol 50 MCG (2000 UT) CAPS Take 1 capsule by mouth daily. PATIENT USING OIL AND NOT CAPSULES     diltiazem (CARDIZEM CD) 300 MG 24 hr capsule TAKE 1 CAPSULE BY MOUTH EVERY DAY 90 capsule 2   fexofenadine (ALLEGRA) 180 MG tablet TAKE 1 TABLET BY MOUTH EVERY DAY 30 tablet 5   flecainide (TAMBOCOR) 50 MG tablet TAKE 1 TABLET BY MOUTH TWICE A DAY 180 tablet 1   hydrochlorothiazide (HYDRODIURIL) 25 MG tablet TAKE 1 TABLET (25 MG TOTAL) BY MOUTH DAILY. 90 tablet 1   ipratropium-albuterol (DUONEB) 0.5-2.5 (3) MG/3ML SOLN Take 3 mLs by nebulization every 6 (six) hours as needed. 360 mL 0   KLOR-CON M10 10 MEQ tablet TAKE 1 TABLET (10 MEQ TOTAL) BY MOUTH 3 (THREE) TIMES DAILY. 270 tablet 0   levothyroxine (SYNTHROID) 100 MCG tablet TAKE 1 TABLET BY MOUTH EVERY MORNING ON AN EMPTY STOMACH Orally Once a day 90 days 90 tablet 4   losartan (COZAAR) 100 MG tablet TAKE 1 TABLET BY MOUTH EVERY DAY 90 tablet 1   Lutein 10 MG TABS Take 10 mg by mouth daily.     montelukast (SINGULAIR) 10 MG tablet Take 1 tablet (10 mg total) by mouth at  bedtime. 90 tablet 1   MULTIPLE VITAMIN PO Take 1 tablet by mouth daily.     Multiple Vitamins-Minerals (PRESERVISION AREDS 2 PO) Take 1 tablet by mouth in the morning and at bedtime.     pantoprazole (PROTONIX) 40 MG tablet TAKE 1 TABLET BY MOUTH EVERY DAY 90 tablet 1   rosuvastatin (CRESTOR) 10 MG tablet TAKE 1 TABLET BY MOUTH EVERY DAY 90 tablet 1   Spacer/Aero-Holding Chambers DEVI Use with spacer 1 each 2   valACYclovir (VALTREX) 500 MG tablet As needed     No current facility-administered medications for this visit.   Allergies: No Known Allergies I reviewed her past medical history, social history, family history, and environmental history and no significant changes have been reported from her previous visit.  Review of Systems  Constitutional:  Negative for appetite change, chills, fever and unexpected weight change.  HENT:  Positive for sneezing. Negative for congestion and rhinorrhea.   Eyes:  Negative for itching.  Respiratory:  Positive for cough, chest tightness, shortness of breath and wheezing.   Cardiovascular:  Negative for chest pain.  Gastrointestinal:  Negative for abdominal pain.  Genitourinary:  Negative for difficulty urinating.  Skin:  Negative for rash.  Allergic/Immunologic: Positive for environmental allergies.  Neurological:  Negative for headaches.    Objective: There were no vitals taken for this visit. There is no height or weight on file to calculate BMI. Physical Exam Vitals and nursing note reviewed.  Constitutional:      Appearance: Normal appearance. She is well-developed. She is obese.  HENT:     Head: Normocephalic and atraumatic.     Right Ear: Tympanic membrane and external ear normal.     Left Ear: Tympanic membrane and external ear normal.     Nose: Nose normal.     Mouth/Throat:     Mouth: Mucous membranes are moist.     Pharynx: Oropharynx is clear.  Eyes:     Conjunctiva/sclera: Conjunctivae normal.  Cardiovascular:     Rate  and Rhythm: Normal rate and regular rhythm.     Heart sounds: Normal heart sounds. No murmur heard.    No friction rub. No gallop.  Pulmonary:     Effort: Pulmonary effort is normal.     Breath sounds: Normal breath sounds. No wheezing, rhonchi or rales.  Musculoskeletal:     Cervical back: Neck supple.  Skin:    General: Skin is warm.     Findings: No rash.  Neurological:     Mental Status: She is alert and oriented to person, place, and time.  Psychiatric:        Behavior: Behavior normal.    Previous notes and tests were reviewed. The plan was reviewed with the patient/family, and all questions/concerned were addressed.  It was my pleasure to see Amy Stokes today and participate in her care. Please feel free to contact me with any questions or concerns.  Sincerely,  Wyline Mood, DO Allergy & Immunology  [facility] office: [phone] [facility] office: [phone]

## 2022-08-13 ENCOUNTER — Encounter: Payer: Self-pay | Admitting: Allergy

## 2022-08-13 ENCOUNTER — Ambulatory Visit: Payer: Medicare HMO | Admitting: Allergy

## 2022-08-13 ENCOUNTER — Other Ambulatory Visit: Payer: Self-pay

## 2022-08-13 ENCOUNTER — Telehealth: Payer: Self-pay | Admitting: Internal Medicine

## 2022-08-13 VITALS — BP 122/66 | HR 66 | Temp 98.4°F | Resp 20 | Ht 67.0 in | Wt 291.1 lb

## 2022-08-13 DIAGNOSIS — K219 Gastro-esophageal reflux disease without esophagitis: Secondary | ICD-10-CM | POA: Diagnosis not present

## 2022-08-13 DIAGNOSIS — J3089 Other allergic rhinitis: Secondary | ICD-10-CM

## 2022-08-13 DIAGNOSIS — J4550 Severe persistent asthma, uncomplicated: Secondary | ICD-10-CM

## 2022-08-13 MED ORDER — ALBUTEROL SULFATE HFA 108 (90 BASE) MCG/ACT IN AERS: 2.0000 | INHALATION_SPRAY | RESPIRATORY_TRACT | 1 refills | Status: DC | PRN

## 2022-08-13 MED ORDER — BREZTRI AEROSPHERE 160-9-4.8 MCG/ACT IN AERO: 2.0000 | INHALATION_SPRAY | Freq: Two times a day (BID) | RESPIRATORY_TRACT | 5 refills | Status: DC

## 2022-08-13 MED ORDER — LOSARTAN POTASSIUM 100 MG PO TABS: 100.0000 mg | ORAL_TABLET | Freq: Every day | ORAL | 1 refills | Status: DC

## 2022-08-13 NOTE — Assessment & Plan Note (Signed)
Past history - Diagnosed with asthma for 30 years.  Currently on Advair 230 mcg 2 puffs twice a day and uses albuterol as needed less than once a month however has daily symptoms.  Main triggers include dust.  Follows with pulmonology as well.  2023 blood work showed elevated eosinophils of 300 and IgE of 1409. 2023 spirometry showed severe restriction with no improvement FEV1 postbronchodilator treatment.  Clinically unchanged. Interim history - still wheezing daily but only uses albuterol a few times per week.  Today's spirometry showed restriction.  Discussed various biologics and gave handouts. Patient willing to start.  Start PA.  Follow up with pulmonology as scheduled.  Daily controller medication(s): Breztri 2 puffs with spacer and rinse mouth afterwards.  May use albuterol rescue inhaler 2 puffs or nebulizer every 4 to 6 hours as needed for shortness of breath, chest tightness, coughing, and wheezing. May use albuterol rescue inhaler 2 puffs 5 to 15 minutes prior to strenuous physical activities. Monitor frequency of use.  Get spirometry at next visit.

## 2022-08-13 NOTE — Patient Instructions (Addendum)
Asthma: Read about injections for asthma - handouts given.  Tammy will be in contact with you regarding coverage.  Daily controller medication(s): Breztri 2 puffs with spacer and rinse mouth afterwards.  May use albuterol rescue inhaler 2 puffs or nebulizer every 4 to 6 hours as needed for shortness of breath, chest tightness, coughing, and wheezing. May use albuterol rescue inhaler 2 puffs 5 to 15 minutes prior to strenuous physical activities. Monitor frequency of use.  Asthma control goals:  Full participation in all desired activities (may need albuterol before activity) Albuterol use two times or less a week on average (not counting use with activity) Cough interfering with sleep two times or less a month Oral steroids no more than once a year No hospitalizations   Environmental allergies 2023 skin testing showed: Positive to mold, cockroach, dust mites.  Continue environmental control measures as below. Use over the counter antihistamines such as Zyrtec (cetirizine), Claritin (loratadine), Allegra (fexofenadine), or Xyzal (levocetirizine) daily as needed. May take twice a day during allergy flares. May switch antihistamines every few months. Continue Singulair (montelukast) 10mg  daily at night. Use Flonase (fluticasone) nasal spray 1 spray per nostril twice a day as needed for nasal congestion.  Nasal saline spray (i.e., Simply Saline) or nasal saline lavage (i.e., NeilMed) is recommended as needed and prior to medicated nasal sprays.  Reflux Continue Protonix daily as before.   Follow up in 3 months or sooner if needed.    Control of House Dust Mite Allergen Dust mite allergens are a common trigger of allergy and asthma symptoms. While they can be found throughout the house, these microscopic creatures thrive in warm, humid environments such as bedding, upholstered furniture and carpeting. Because so much time is spent in the bedroom, it is essential to reduce mite levels there.   Encase pillows, mattresses, and box springs in special allergen-proof fabric covers or airtight, zippered plastic covers.  Bedding should be washed weekly in hot water (130 F) and dried in a hot dryer. Allergen-proof covers are available for comforters and pillows that can't be regularly washed.  Wash the allergy-proof covers every few months. Minimize clutter in the bedroom. Keep pets out of the bedroom.  Keep humidity less than 50% by using a dehumidifier or air conditioning. You can buy a humidity measuring device called a hygrometer to monitor this.  If possible, replace carpets with hardwood, linoleum, or washable area rugs. If that's not possible, vacuum frequently with a vacuum that has a HEPA filter. Remove all upholstered furniture and non-washable window drapes from the bedroom. Remove all non-washable stuffed toys from the bedroom.  Wash stuffed toys weekly. Mold Control Mold and fungi can grow on a variety of surfaces provided certain temperature and moisture conditions exist.  Outdoor molds grow on plants, decaying vegetation and soil. The major outdoor mold, Alternaria and Cladosporium, are found in very high numbers during hot and dry conditions. Generally, a late summer - fall peak is seen for common outdoor fungal spores. Rain will temporarily lower outdoor mold spore count, but counts rise rapidly when the rainy period ends. The most important indoor molds are Aspergillus and Penicillium. Dark, humid and poorly ventilated basements are ideal sites for mold growth. The next most common sites of mold growth are the bathroom and the kitchen. Outdoor (Seasonal) Mold Control Use air conditioning and keep windows closed. Avoid exposure to decaying vegetation. Avoid leaf raking. Avoid grain handling. Consider wearing a face mask if working in moldy areas.  Indoor (Perennial)  Mold Control  Maintain humidity below 50%. Get rid of mold growth on hard surfaces with water, detergent and,  if necessary, 5% bleach (do not mix with other cleaners). Then dry the area completely. If mold covers an area more than 10 square feet, consider hiring an indoor environmental professional. For clothing, washing with soap and water is best. If moldy items cannot be cleaned and dried, throw them away. Remove sources e.g. contaminated carpets. Repair and seal leaking roofs or pipes. Using dehumidifiers in damp basements may be helpful, but empty the water and clean units regularly to prevent mildew from forming. All rooms, especially basements, bathrooms and kitchens, require ventilation and cleaning to deter mold and mildew growth. Avoid carpeting on concrete or damp floors, and storing items in damp areas. Cockroach Allergen Avoidance Cockroaches are often found in the homes of densely populated urban areas, schools or commercial buildings, but these creatures can lurk almost anywhere. This does not mean that you have a dirty house or living area. Block all areas where roaches can enter the home. This includes crevices, wall cracks and windows.  Cockroaches need water to survive, so fix and seal all leaky faucets and pipes. Have an exterminator go through the house when your family and pets are gone to eliminate any remaining roaches. Keep food in lidded containers and put pet food dishes away after your pets are done eating. Vacuum and sweep the floor after meals, and take out garbage and recyclables. Use lidded garbage containers in the kitchen. Wash dishes immediately after use and clean under stoves, refrigerators or toasters where crumbs can accumulate. Wipe off the stove and other kitchen surfaces and cupboards regularly.

## 2022-08-13 NOTE — Telephone Encounter (Signed)
Amy Stokes with [facility] is calling and pt need a refill on losartan (COZAAR) 100 MG tablet #90 w/refills  [id number] [place], [place] - [address] Phone: [phone]  Fax: [phone]

## 2022-08-13 NOTE — Assessment & Plan Note (Signed)
Past history - No prior ENT evaluation. 2023 skin testing showed: Positive to mold, cockroach, dust mites.  Interim history - controlled.  Continue environmental control measures as below. Use over the counter antihistamines such as Zyrtec (cetirizine), Claritin (loratadine), Allegra (fexofenadine), or Xyzal (levocetirizine) daily as needed. May take twice a day during allergy flares. May switch antihistamines every few months. Continue Singulair (montelukast) 10mg  daily at night. Use Flonase (fluticasone) nasal spray 1 spray per nostril twice a day as needed for nasal congestion.  Nasal saline spray (i.e., Simply Saline) or nasal saline lavage (i.e., NeilMed) is recommended as needed and prior to medicated nasal sprays.

## 2022-08-13 NOTE — Telephone Encounter (Signed)
Rx sent 

## 2022-08-13 NOTE — Assessment & Plan Note (Signed)
Did not stop Protonix.  Continue Protonix daily as before.

## 2022-08-14 ENCOUNTER — Telehealth: Payer: Self-pay | Admitting: *Deleted

## 2022-08-14 NOTE — Telephone Encounter (Signed)
Noted  

## 2022-08-14 NOTE — Telephone Encounter (Signed)
Called patient and discussed patient assistance program for [facility] and will sign patient up and be in touch regarding status of app

## 2022-08-14 NOTE — Telephone Encounter (Signed)
-----   Message from Ellamae Sia, DO sent at 08/13/2022  1:07 PM EDT ----- Please start Pa for Nucala or Fasenra for severe asthma. Eos 200 in Sept 2023. Thank you.

## 2022-08-21 NOTE — Telephone Encounter (Signed)
Called patient and advised approval, shipment to home and storage instructions with initial injection in clinic

## 2022-08-30 DIAGNOSIS — R69 Illness, unspecified: Secondary | ICD-10-CM | POA: Diagnosis not present

## 2022-09-01 ENCOUNTER — Ambulatory Visit (HOSPITAL_BASED_OUTPATIENT_CLINIC_OR_DEPARTMENT_OTHER): Payer: Medicare HMO | Admitting: Cardiology

## 2022-09-01 ENCOUNTER — Encounter (HOSPITAL_BASED_OUTPATIENT_CLINIC_OR_DEPARTMENT_OTHER): Payer: Self-pay | Admitting: Cardiology

## 2022-09-01 VITALS — BP 127/66 | HR 71 | Ht 67.0 in | Wt 292.3 lb

## 2022-09-01 DIAGNOSIS — D6869 Other thrombophilia: Secondary | ICD-10-CM

## 2022-09-01 DIAGNOSIS — E782 Mixed hyperlipidemia: Secondary | ICD-10-CM

## 2022-09-01 DIAGNOSIS — I10 Essential (primary) hypertension: Secondary | ICD-10-CM | POA: Diagnosis not present

## 2022-09-01 DIAGNOSIS — G4733 Obstructive sleep apnea (adult) (pediatric): Secondary | ICD-10-CM

## 2022-09-01 DIAGNOSIS — I480 Paroxysmal atrial fibrillation: Secondary | ICD-10-CM | POA: Diagnosis not present

## 2022-09-01 DIAGNOSIS — Z7901 Long term (current) use of anticoagulants: Secondary | ICD-10-CM | POA: Diagnosis not present

## 2022-09-01 NOTE — Patient Instructions (Signed)
Medication Instructions:  Your physician recommends that you continue on your current medications as directed. Please refer to the Current Medication list given to you today.   *If you need a refill on your cardiac medications before your next appointment, please call your pharmacy*  Lab Work: NONE  Testing/Procedures: NONE   Follow-Up: At [facility], you and your health needs are our priority.  As part of our continuing mission to provide you with exceptional heart care, we have created designated Provider Care Teams.  These Care Teams include your primary Cardiologist (physician) and Advanced Practice Providers (APPs -  Physician Assistants and Nurse Practitioners) who all work together to provide you with the care you need, when you need it.  We recommend signing up for the patient portal called "MyChart".  Sign up information is provided on this After Visit Summary.  MyChart is used to connect with patients for Virtual Visits (Telemedicine).  Patients are able to view lab/test results, encounter notes, upcoming appointments, etc.  Non-urgent messages can be sent to your provider as well.   To learn more about what you can do with MyChart, go to [email or web address].    Your next appointment:   6 month(s)  The format for your next appointment:   In Person  Provider:   Bridgette Christopher, MD             

## 2022-09-01 NOTE — Progress Notes (Signed)
Cardiology Office Note:    Date:  09/01/2022   ID:  Amy Stokes, DOB 25-May-1950, MRN [id number]  PCP:  Madelin Headings, MD  Cardiologist:  Jodelle Red, MD  Referring MD: Madelin Headings, MD   CC: Follow-up  History of Present Illness:    Amy Stokes is a 72 y.o. female with a hx of paroxysmal atrial fibrillation, hypertension, asthma, OSA on CPAP, morbid obesity who is seen for follow-up. I initially met her 12/10/2020 as a new consult at the request of Panosh, Neta Mends, MD for the evaluation and management of elevated heart rate, concern for atrial fibrillation.  Cardiovascular risk factors: hypertension, OSA, morbid obesity Prior clinical ASCVD: none Comorbid conditions: Endorses hypertension, hyperlipidemia; Denies diabetes, chronic kidney disease  Metabolic syndrome/Obesity: interested in weight loss, discussing GLP1RA with her endocrinologist. Tobacco use history: former Prior pertinent testing and/or incidental findings: echo as below.  At her last visit she felt her Afib was well controlled; rare brief episodes. Her fitbit showed rare spikes in her HR when sleeping, but she was asymptomatic. Brought her BP log with ranges in the prior 2 months 80/67(immediate recheck 129/62)-141/61. Most were 120s/60s. Ankles with minimal chronic edema. Breathing generally stable, using albuterol about once/week. Tolerating rosuvastatin, last LDL 76. It was felt she would be a good candidate for GLP1RA for weight loss if covered by medicare. BMI 43.  She followed up with Dr. Elberta Fortis 08/08/2022. She was doing well and maintaining sinus rhythm.  Today, she reports one episode of Afib last night, from 10:27 pm to 11:05 pm, per her home monitoring. She was asleep at the time. Overall she states that her arrhythmic episodes are still few and far between.  Her blood pressure has been stable at home. Blood pressure log shows systolic readings averaging 110s-120s, and some 130s at  most.  Additionally she complains of recent ocular migraines. She had 4 of them in 05/2022, and another last month. She had been taking a cat nap and woke up abruptly. Subsequently she developed the ocular migraine. The only symptom she experiences is "sparkly" visual disturbances. She has been unable to pinpoint potential triggers.  Her feet have been a little more swollen lately, but only slightly. She is still able to wear her shoes comfortably.   Endorses asthma. On 6/10 she will be starting [place].  She denies any chest pain, peripheral edema, lightheadedness, syncope, orthopnea, or PND.   Past Medical History:  Diagnosis Date   Arthritis    no meds   Asthma    Edema    Fibroids    Hyperlipidemia    Hypertension    Obesity    RML pneumonia 02/25/2021   Sleep apnea    wears cpap    Thyroid nodule 01/2009   left discovered on chest ct; needle bx "indeterminant" cannon r/o follicular neoplasm     Past Surgical History:  Procedure Laterality Date   ABDOMINAL HYSTERECTOMY Bilateral 08/02/2013   Procedure: TOTAL ABDOMINAL HYSTERECTOMY WITH BILATERAL SALPINGO-OOPHORECTOMY ;  Surgeon: Ok Edwards, MD;  Location: [facility];  Service: Gynecology;  Laterality: Bilateral;    Dr. Audie Box to assist.  Dr. Abbey Chatters to follow with an umbilical hernia repair. He will need an additional 1 1/2 hours.    COLONOSCOPY  12/27/2007   [facility]    HERNIA REPAIR     MOUTH SURGERY     thyroid needle bx     TONSILLECTOMY     UMBILICAL HERNIA REPAIR N/A 08/02/2013  Procedure: HERNIA REPAIR UMBILICAL ADULT WITH MESH;  Surgeon: Adolph Pollack, MD;  Location: [facility];  Service: General;  Laterality: N/A;    Current Medications: Current Outpatient Medications on File Prior to Visit  Medication Sig   acetaminophen (TYLENOL) 650 MG CR tablet Take 1,300 mg by mouth every 8 (eight) hours as needed for pain.   albuterol (VENTOLIN HFA) 108 (90 Base) MCG/ACT inhaler Inhale 2 puffs into the lungs  every 4 (four) hours as needed for wheezing or shortness of breath (coughing fits).   apixaban (ELIQUIS) 5 MG TABS tablet TAKE 1 TABLET BY MOUTH TWICE A DAY   Budeson-Glycopyrrol-Formoterol (BREZTRI AEROSPHERE) 160-9-4.8 MCG/ACT AERO Inhale 2 puffs into the lungs in the morning and at bedtime.   Cholecalciferol 50 MCG (2000 UT) CAPS Take 1 capsule by mouth daily. PATIENT USING OIL AND NOT CAPSULES   diltiazem (CARDIZEM CD) 300 MG 24 hr capsule TAKE 1 CAPSULE BY MOUTH EVERY DAY   fexofenadine (ALLEGRA) 180 MG tablet TAKE 1 TABLET BY MOUTH EVERY DAY   flecainide (TAMBOCOR) 50 MG tablet TAKE 1 TABLET BY MOUTH TWICE A DAY   hydrochlorothiazide (HYDRODIURIL) 25 MG tablet TAKE 1 TABLET (25 MG TOTAL) BY MOUTH DAILY.   ipratropium-albuterol (DUONEB) 0.5-2.5 (3) MG/3ML SOLN Take 3 mLs by nebulization every 6 (six) hours as needed.   KLOR-CON M10 10 MEQ tablet TAKE 1 TABLET (10 MEQ TOTAL) BY MOUTH 3 (THREE) TIMES DAILY.   levothyroxine (SYNTHROID) 100 MCG tablet TAKE 1 TABLET BY MOUTH EVERY MORNING ON AN EMPTY STOMACH Orally Once a day 90 days   losartan (COZAAR) 100 MG tablet Take 1 tablet (100 mg total) by mouth daily.   Lutein 10 MG TABS Take 10 mg by mouth daily.   montelukast (SINGULAIR) 10 MG tablet Take 1 tablet (10 mg total) by mouth at bedtime.   MULTIPLE VITAMIN PO Take 1 tablet by mouth daily.   Multiple Vitamins-Minerals (PRESERVISION AREDS 2 PO) Take 1 tablet by mouth in the morning and at bedtime.   pantoprazole (PROTONIX) 40 MG tablet TAKE 1 TABLET BY MOUTH EVERY DAY   rosuvastatin (CRESTOR) 10 MG tablet TAKE 1 TABLET BY MOUTH EVERY DAY   Spacer/Aero-Holding Chambers DEVI Use with spacer   valACYclovir (VALTREX) 500 MG tablet As needed   No current facility-administered medications on file prior to visit.     Allergies:   Patient has no known allergies.   Social History   Tobacco Use   Smoking status: Former    Packs/day: 1.00    Years: 1.00    Additional pack years: 0.00     Total pack years: 1.00    Types: Cigarettes    Quit date: 04/29/1971    Years since quitting: 51.3    Passive exposure: Never   Smokeless tobacco: Never  Vaping Use   Vaping Use: Never used  Substance Use Topics   Alcohol use: Not Currently    Comment: occasionally   Drug use: No    Family History: family history includes Arthritis in her mother; Heart disease in her brother and father; Other in her mother; Pancreatic cancer in her mother; Thyroid nodules in her sister. There is no history of Colon cancer, Colon polyps, Esophageal cancer, Rectal cancer, or Stomach cancer.  ROS:   Please see the history of present illness. (+) Ocular migraines (+) LE edema bilaterally All other systems are reviewed and negative.    EKGs/Labs/Other Studies Reviewed:    The following studies were reviewed today:  Echo 11/01/20 1. Left ventricular ejection fraction, by estimation, is 55 to 60%. The  left ventricle has normal function. The left ventricle has no regional  wall motion abnormalities. Left ventricular diastolic parameters are  indeterminate.   2. Right ventricular systolic function is normal. The right ventricular  size is mildly enlarged. Tricuspid regurgitation signal is inadequate for assessing PA pressure.   3. The mitral valve is grossly normal. No evidence of mitral valve  regurgitation. No evidence of mitral stenosis.   4. The aortic valve is grossly normal. Aortic valve regurgitation is not  visualized. No aortic stenosis is present.   5. The inferior vena cava is dilated in size with >50% respiratory  variability, suggesting right atrial pressure of 8 mmHg.   Comparison(s): No significant change from prior study.   EKG:  EKG is personally reviewed.   09/01/2022:  Not ordered. 02/28/2022: not ordered  02/18/2021: NSR at 71 bpm 12/10/20: Atrial fibrillation with RVR at 101 bpm  Recent Labs: 11/07/2021: ALT 12 11/29/2021: BUN 17; Creatinine, Ser 0.80; Potassium 3.7; Sodium  138 12/25/2021: Hemoglobin 12.5; Platelets 248.0   Recent Lipid Panel    Component Value Date/Time   CHOL 134 11/07/2021 0814   TRIG 42 11/07/2021 0814   HDL 48 11/07/2021 0814   CHOLHDL 2.8 11/07/2021 0814   CHOLHDL 4 07/02/2020 0805   VLDL 19.0 07/02/2020 0805   LDLCALC 76 11/07/2021 0814   LDLDIRECT 112 (H) 01/10/2021 1016   LDLDIRECT 141.7 04/03/2011 1043    Physical Exam:    VS:  BP 127/66 (BP Location: Left Arm, Patient Position: Sitting, Cuff Size: Large)   Pulse 71   Ht 5\' 7"  (1.702 m)   Wt 292 lb 4.8 oz (132.6 kg)   SpO2 93%   BMI 45.78 kg/m     Wt Readings from Last 3 Encounters:  09/01/22 292 lb 4.8 oz (132.6 kg)  08/13/22 291 lb 1 oz (132 kg)  08/08/22 293 lb (132.9 kg)    GEN: Well nourished, well developed in no acute distress HEENT: Normal, moist mucous membranes NECK: No JVD CARDIAC: regular rhythm on exam, normal S1 and S2, no rubs or gallops. 1/6 systolic murmur. VASCULAR: Radial and DP pulses 2+ bilaterally. No carotid bruits RESPIRATORY:  Clear to auscultation without rales, wheezing or rhonchi  ABDOMEN: Soft, non-tender, non-distended MUSCULOSKELETAL:  Ambulates independently SKIN: Warm and dry, chronic venous stasis discoloration.  NEUROLOGIC:  Alert and oriented x 3. No focal neuro deficits noted. PSYCHIATRIC:  Normal affect    ASSESSMENT:    1. Paroxysmal atrial fibrillation (HCC)   2. Secondary hypercoagulable state (HCC)   3. Essential hypertension   4. OSA (obstructive sleep apnea)   5. Long term current use of anticoagulant   6. Mixed hyperlipidemia   7. Morbid obesity (HCC)     PLAN:    Paroxysmal atrial fibrillation: -in NSR today -CHA2DS2/VAS Stroke Risk Points = 3. Continue apixaban -on flecainide, follows with Dr. Elberta Fortis. -on diltiazem as well  Hypertension: home numbers well controlled -continue HCTZ, losartan.  -on diltiazem as well  Hyperlipidemia:  -tolerating rosuvastatin, last LDL 76  OSA: continue  CPAP  Morbid obesity: would be a good candidate for GLP1RA for weight loss if covered by medicare. BMI 45.    Cardiac risk counseling and prevention recommendations: -recommend heart healthy/Mediterranean diet, with whole grains, fruits, vegetable, fish, lean meats, nuts, and olive oil. Limit salt. -recommend moderate walking, 3-5 times/week for 30-50 minutes each session. Aim for at least 150  minutes.week. Goal should be pace of 3 miles/hours, or walking 1.5 miles in 30 minutes -recommend avoidance of tobacco products. Avoid excess alcohol. -ASCVD risk score: The 10-year ASCVD risk score (Arnett DK, et al., 2019) is: 12.7%   Values used to calculate the score:     Age: 78 years     Sex: Female     Is Non-Hispanic African American: No     Diabetic: No     Tobacco smoker: No     Systolic Blood Pressure: 127 mmHg     Is BP treated: Yes     HDL Cholesterol: 48 mg/dL     Total Cholesterol: 134 mg/dL    Plan for follow up: 6 months or sooner as needed  Jodelle Red, MD, PhD, [facility] [place]  [facility]    Medication Adjustments/Labs and Tests Ordered: Current medicines are reviewed at length with the patient today.  Concerns regarding medicines are outlined above.   No orders of the defined types were placed in this encounter.  No orders of the defined types were placed in this encounter.  Patient Instructions  Medication Instructions:  Your physician recommends that you continue on your current medications as directed. Please refer to the Current Medication list given to you today.  *If you need a refill on your cardiac medications before your next appointment, please call your pharmacy*  Lab Work: NONE  Testing/Procedures: NONE  Follow-Up: At [facility], you and your health needs are our priority.  As part of our continuing mission to provide you with exceptional heart care, we have created designated Provider Care Teams.  These Care Teams include  your primary Cardiologist (physician) and Advanced Practice Providers (APPs -  Physician Assistants and Nurse Practitioners) who all work together to provide you with the care you need, when you need it.  We recommend signing up for the patient portal called "MyChart".  Sign up information is provided on this After Visit Summary.  MyChart is used to connect with patients for Virtual Visits (Telemedicine).  Patients are able to view lab/test results, encounter notes, upcoming appointments, etc.  Non-urgent messages can be sent to your provider as well.   To learn more about what you can do with MyChart, go to [email or web address].    Your next appointment:   6 month(s)  The format for your next appointment:   In Person  Provider:   Jodelle Red, MD       [facility] Leland Stumpf,acting as a scribe for Jodelle Red, MD.,have documented all relevant documentation on the behalf of Jodelle Red, MD,as directed by  Jodelle Red, MD while in the presence of Jodelle Red, MD.  I, Jodelle Red, MD, have reviewed all documentation for this visit. The documentation on 09/01/22 for the exam, diagnosis, procedures, and orders are all accurate and complete.   Signed, Jodelle Red, MD PhD 09/01/2022 1:10 PM    [facility]

## 2022-09-08 ENCOUNTER — Ambulatory Visit (INDEPENDENT_AMBULATORY_CARE_PROVIDER_SITE_OTHER): Payer: Medicare HMO

## 2022-09-08 DIAGNOSIS — J4550 Severe persistent asthma, uncomplicated: Secondary | ICD-10-CM | POA: Diagnosis not present

## 2022-09-08 MED ORDER — BENRALIZUMAB 30 MG/ML ~~LOC~~ SOSY
30.0000 mg | PREFILLED_SYRINGE | Freq: Once | SUBCUTANEOUS | Status: AC
  Administered 2022-09-08: 30 mg via SUBCUTANEOUS

## 2022-09-08 NOTE — Progress Notes (Signed)
Immunotherapy   Patient Details  Name: Amy Stokes MRN: [id number] Date of Birth: 07-22-1950  09/08/2022  Amy Stokes started injections for  [facility]  Frequency:EVERY 4 WEEKS  Consent signed and Shot room appointment schedule provided.   Patient received her first Fasenra injection in office and waited 30 minutes with no issues.   Orson Aloe 09/08/2022, 10:04 AM

## 2022-09-29 DIAGNOSIS — R69 Illness, unspecified: Secondary | ICD-10-CM | POA: Diagnosis not present

## 2022-10-06 ENCOUNTER — Telehealth: Payer: Self-pay

## 2022-10-06 ENCOUNTER — Ambulatory Visit (INDEPENDENT_AMBULATORY_CARE_PROVIDER_SITE_OTHER): Payer: Medicare HMO

## 2022-10-06 DIAGNOSIS — J4550 Severe persistent asthma, uncomplicated: Secondary | ICD-10-CM | POA: Diagnosis not present

## 2022-10-06 MED ORDER — BENRALIZUMAB 30 MG/ML ~~LOC~~ SOSY
30.0000 mg | PREFILLED_SYRINGE | Freq: Once | SUBCUTANEOUS | Status: AC
  Administered 2022-10-06: 30 mg via SUBCUTANEOUS

## 2022-10-06 NOTE — Telephone Encounter (Signed)
Okay.  Noted.  

## 2022-10-06 NOTE — Telephone Encounter (Signed)
Patient is interested in doing Fasenra shots at home . I went over how to administer the Fasenra injection at home and at her next appointment the patient will do a self administered demonstration before doing shots at home.

## 2022-10-18 ENCOUNTER — Other Ambulatory Visit: Payer: Self-pay | Admitting: Allergy

## 2022-10-18 ENCOUNTER — Other Ambulatory Visit: Payer: Self-pay | Admitting: Family

## 2022-10-30 DIAGNOSIS — R69 Illness, unspecified: Secondary | ICD-10-CM | POA: Diagnosis not present

## 2022-11-03 ENCOUNTER — Ambulatory Visit: Payer: Medicare HMO

## 2022-11-03 DIAGNOSIS — J4550 Severe persistent asthma, uncomplicated: Secondary | ICD-10-CM

## 2022-11-03 MED ORDER — BENRALIZUMAB 30 MG/ML ~~LOC~~ SOSY
30.0000 mg | PREFILLED_SYRINGE | Freq: Once | SUBCUTANEOUS | Status: AC
  Administered 2022-11-03: 30 mg via SUBCUTANEOUS

## 2022-11-05 ENCOUNTER — Other Ambulatory Visit (HOSPITAL_BASED_OUTPATIENT_CLINIC_OR_DEPARTMENT_OTHER): Payer: Self-pay | Admitting: Cardiology

## 2022-11-05 DIAGNOSIS — I480 Paroxysmal atrial fibrillation: Secondary | ICD-10-CM

## 2022-11-05 NOTE — Telephone Encounter (Signed)
Pt is calling back about her XR that was sent to dr.Webb .

## 2022-11-07 ENCOUNTER — Other Ambulatory Visit: Payer: Self-pay

## 2022-11-07 ENCOUNTER — Telehealth: Payer: Self-pay

## 2022-11-07 MED ORDER — POTASSIUM CHLORIDE CRYS ER 10 MEQ PO TBCR: 10.0000 meq | EXTENDED_RELEASE_TABLET | Freq: Three times a day (TID) | ORAL | 0 refills | Status: DC

## 2022-11-07 NOTE — Telephone Encounter (Signed)
Spoke to pt and inform her the prescription of potassium is sent to [facility] [address]   Offer to schedule a cpe appt with provider for this year. Pt states she would give [place] a call back on Monday to schedule.

## 2022-11-07 NOTE — Progress Notes (Signed)
   11/07/2022  Patient ID: Amy Stokes, female   DOB: July 21, 1950, 72 y.o.   MRN: [id number]  Returning patient's call(s) to PCP office this morning.  She states she is down to 2 days of her Potassium prescription, and her pharmacy has not been able to get this refilled by the prescriber.  I have pended a 90 day refill and routed it to the patient's PCP, Dr. Fabian Sharp, to sign if in agreement.  I will contact the patient to let her know if/when order is signed.  Lenna Gilford, PharmD, DPLA

## 2022-11-12 DIAGNOSIS — E02 Subclinical iodine-deficiency hypothyroidism: Secondary | ICD-10-CM | POA: Diagnosis not present

## 2022-11-12 DIAGNOSIS — I10 Essential (primary) hypertension: Secondary | ICD-10-CM | POA: Diagnosis not present

## 2022-11-12 NOTE — Progress Notes (Signed)
Chief Complaint  Patient presents with   Medical Management of Chronic Issues    HPI: Amy Stokes 72 y.o. come in for Chronic disease management  last visit 11 23  Has copy of labs from [facility] and tsh 8 14  Hydrochlorothiazide losartan and potassium   for BP and is under control by report . Need on going refills   Asthma under care   this week had lower pulse ox in garden working   every 6 month  CV a fib hx  stable    Taking magnesium. Otc  no se noted  ROS: See pertinent positives and negatives per HPI.  Past Medical History:  Diagnosis Date   Arthritis    no meds   Asthma    Edema    Fibroids    Hyperlipidemia    Hypertension    Obesity    RML pneumonia 02/25/2021   Sleep apnea    wears cpap    Thyroid nodule 01/2009   left discovered on chest ct; needle bx "indeterminant" cannon r/o follicular neoplasm     Family History  Problem Relation Age of Onset   Arthritis Mother    Other Mother        neurological disorder   Pancreatic cancer Mother    Heart disease Father    Thyroid nodules Sister    Heart disease Brother        x2 1 brother deceased   Colon cancer Neg Hx    Colon polyps Neg Hx    Esophageal cancer Neg Hx    Rectal cancer Neg Hx    Stomach cancer Neg Hx     Social History   Socioeconomic History   Marital status: Married    Spouse name: Not on file   Number of children: 0   Years of education: Not on file   Highest education level: Associate degree: academic program  Occupational History   Occupation: retired  Tobacco Use   Smoking status: Former    Current packs/day: 0.00    Average packs/day: 1 pack/day for 1 year (1.0 ttl pk-yrs)    Types: Cigarettes    Start date: 04/28/1970    Quit date: 04/29/1971    Years since quitting: 51.5    Passive exposure: Never   Smokeless tobacco: Never  Vaping Use   Vaping status: Never Used  Substance and Sexual Activity   Alcohol use: Not Currently    Comment: occasionally   Drug use:  No   Sexual activity: Not on file  Other Topics Concern   Not on file  Social History Narrative   Married   Occupation: Advertising account executive currently unemployed   Married   Regular exercise-no   HH of 2   Pet cat      Social Determinants of Health   Financial Resource Strain: Low Risk  (11/12/2022)   Overall Financial Resource Strain (CARDIA)    Difficulty of Paying Living Expenses: Not very hard  Food Insecurity: No Food Insecurity (11/12/2022)   Hunger Vital Sign    Worried About Running Out of Food in the Last Year: Never true    Ran Out of Food in the Last Year: Never true  Transportation Needs: No Transportation Needs (11/12/2022)   PRAPARE - Administrator, Civil Service (Medical): No    Lack of Transportation (Non-Medical): No  Physical Activity: Sufficiently Active (11/12/2022)   Exercise Vital Sign    Days of Exercise per Week: 3  days    Minutes of Exercise per Session: 90 min  Stress: No Stress Concern Present (11/12/2022)   [employer] of Occupational Health - Occupational Stress Questionnaire    Feeling of Stress : Only a little  Social Connections: Moderately Isolated (11/12/2022)   Social Connection and Isolation Panel [NHANES]    Frequency of Communication with Friends and Family: Once a week    Frequency of Social Gatherings with Friends and Family: Once a week    Attends Religious Services: Never    Database administrator or Organizations: No    Attends Engineer, structural: More than 4 times per year    Marital Status: Married    Outpatient Medications Prior to Visit  Medication Sig Dispense Refill   acetaminophen (TYLENOL) 650 MG CR tablet Take 1,300 mg by mouth every 8 (eight) hours as needed for pain.     albuterol (VENTOLIN HFA) 108 (90 Base) MCG/ACT inhaler Inhale 2 puffs into the lungs every 4 (four) hours as needed for wheezing or shortness of breath (coughing fits). (Patient not taking: Reported on 11/17/2022) 18 g 1   apixaban  (ELIQUIS) 5 MG TABS tablet TAKE 1 TABLET BY MOUTH TWICE A DAY 60 tablet 5   benralizumab (FASENRA) 10 MG/0.5ML prefilled syringe Inject as directed. Every 4 wks.     Budeson-Glycopyrrol-Formoterol (BREZTRI AEROSPHERE) 160-9-4.8 MCG/ACT AERO Inhale 2 puffs into the lungs in the morning and at bedtime. 10.7 g 5   Cholecalciferol 50 MCG (2000 UT) CAPS Take 1 capsule by mouth daily. PATIENT USING OIL AND NOT CAPSULES     diltiazem (CARDIZEM CD) 300 MG 24 hr capsule TAKE 1 CAPSULE BY MOUTH EVERY DAY 90 capsule 3   fexofenadine (ALLEGRA) 180 MG tablet TAKE 1 TABLET BY MOUTH EVERY DAY 30 tablet 5   flecainide (TAMBOCOR) 50 MG tablet TAKE 1 TABLET BY MOUTH TWICE A DAY 180 tablet 1   hydrochlorothiazide (HYDRODIURIL) 25 MG tablet TAKE 1 TABLET (25 MG TOTAL) BY MOUTH DAILY. 90 tablet 1   levothyroxine (SYNTHROID) 100 MCG tablet TAKE 1 TABLET BY MOUTH EVERY MORNING ON AN EMPTY STOMACH Orally Once a day 90 days 90 tablet 4   losartan (COZAAR) 100 MG tablet Take 1 tablet (100 mg total) by mouth daily. 90 tablet 1   Lutein 10 MG TABS Take 10 mg by mouth daily.     MAGNESIUM CITRATE PO Take 250 mg by mouth.     montelukast (SINGULAIR) 10 MG tablet TAKE 1 TABLET BY MOUTH EVERYDAY AT BEDTIME 90 tablet 1   MULTIPLE VITAMIN PO Take 1 tablet by mouth daily.     Omega-3 Fatty Acids (FISH OIL) 1000 MG CAPS      pantoprazole (PROTONIX) 40 MG tablet TAKE 1 TABLET BY MOUTH EVERY DAY 90 tablet 1   potassium chloride (KLOR-CON M10) 10 MEQ tablet Take 1 tablet (10 mEq total) by mouth 3 (three) times daily. 270 tablet 0   rosuvastatin (CRESTOR) 10 MG tablet TAKE 1 TABLET BY MOUTH EVERY DAY 90 tablet 1   Spacer/Aero-Holding Chambers DEVI Use with spacer 1 each 2   valACYclovir (VALTREX) 500 MG tablet As needed     ipratropium-albuterol (DUONEB) 0.5-2.5 (3) MG/3ML SOLN Take 3 mLs by nebulization every 6 (six) hours as needed. (Patient not taking: Reported on 11/13/2022) 360 mL 0   Multiple Vitamins-Minerals (PRESERVISION AREDS  2 PO) Take 1 tablet by mouth in the morning and at bedtime.     No facility-administered medications prior to  visit.     EXAM:  BP 126/68 (BP Location: Left Arm, Patient Position: Sitting, Cuff Size: Large)   Pulse 67   Temp 97.6 F (36.4 C) (Oral)   Ht 5\' 7"  (1.702 m)   Wt 293 lb 12.8 oz (133.3 kg)   SpO2 96%   BMI 46.02 kg/m   Body mass index is 46.02 kg/m.  GENERAL: vitals reviewed and listed above, alert, oriented, appears well hydrated and in no acute distress HEENT: atraumatic, conjunctiva  clear, no obvious abnormalities on inspection of external nose and ears NECK: no obvious masses on inspection palpation  LUNGS: clear to auscultation bilaterally, no wheezes, rales or rhonchi, good air movement CV: HRRR, no clubbing cyanosis or  chronic skin changes  LE nl cap refill  MS: moves all extremities without noticeable focal  abnormality PSYCH: pleasant and cooperative, no obvious depression or anxiety Lab Results  Component Value Date   WBC 8.4 11/13/2022   HGB 12.8 11/13/2022   HCT 39.8 11/13/2022   PLT 278.0 11/13/2022   GLUCOSE 96 11/29/2021   CHOL 142 11/13/2022   TRIG 63.0 11/13/2022   HDL 44.80 11/13/2022   LDLDIRECT 112 (H) 01/10/2021   LDLCALC 84 11/13/2022   ALT 12 11/07/2021   AST 13 11/07/2021   NA 138 11/29/2021   K 3.7 11/29/2021   CL 97 11/29/2021   CREATININE 0.80 11/29/2021   BUN 17 11/29/2021   CO2 35 (H) 11/29/2021   TSH 2.00 10/12/2020   INR 0.98 08/02/2013   HGBA1C 6.1 11/13/2022   BP Readings from Last 3 Encounters:  11/17/22 122/64  11/13/22 126/68  09/01/22 127/66    ASSESSMENT AND PLAN:  Discussed the following assessment and plan:  Essential hypertension - continue medication metabolic monitoring - Plan: Lipid panel, CBC with Differential/Platelet, Hemoglobin A1c  Medication management - Plan: Lipid panel, CBC with Differential/Platelet, Hemoglobin A1c  Fasting hyperglycemia - monitor lsi - Plan: Lipid panel, CBC with  Differential/Platelet, Hemoglobin A1c  Mixed hyperlipidemia - Plan: Lipid panel, CBC with Differential/Platelet, Hemoglobin A1c Due for lab monitoring  -Patient advised to return or notify health care team  if  new concerns arise.  Patient Instructions  Good to see you today . Will share lab results with team as possible.  BP in control . Neta Mends. Bradrick Kamau M.D.

## 2022-11-13 ENCOUNTER — Encounter: Payer: Self-pay | Admitting: Internal Medicine

## 2022-11-13 ENCOUNTER — Ambulatory Visit (INDEPENDENT_AMBULATORY_CARE_PROVIDER_SITE_OTHER): Payer: Medicare HMO | Admitting: Internal Medicine

## 2022-11-13 VITALS — BP 126/68 | HR 67 | Temp 97.6°F | Ht 67.0 in | Wt 293.8 lb

## 2022-11-13 DIAGNOSIS — Z79899 Other long term (current) drug therapy: Secondary | ICD-10-CM | POA: Diagnosis not present

## 2022-11-13 DIAGNOSIS — R7301 Impaired fasting glucose: Secondary | ICD-10-CM

## 2022-11-13 DIAGNOSIS — I10 Essential (primary) hypertension: Secondary | ICD-10-CM

## 2022-11-13 DIAGNOSIS — E782 Mixed hyperlipidemia: Secondary | ICD-10-CM

## 2022-11-13 LAB — CBC WITH DIFFERENTIAL/PLATELET
Basophils Absolute: 0 10*3/uL (ref 0.0–0.1)
Basophils Relative: 0.1 % (ref 0.0–3.0)
Eosinophils Absolute: 0 10*3/uL (ref 0.0–0.7)
Eosinophils Relative: 0 % (ref 0.0–5.0)
HCT: 39.8 % (ref 36.0–46.0)
Hemoglobin: 12.8 g/dL (ref 12.0–15.0)
Lymphocytes Relative: 16.4 % (ref 12.0–46.0)
Lymphs Abs: 1.4 10*3/uL (ref 0.7–4.0)
MCHC: 32.2 g/dL (ref 30.0–36.0)
MCV: 87.1 fl (ref 78.0–100.0)
Monocytes Absolute: 0.6 10*3/uL (ref 0.1–1.0)
Monocytes Relative: 6.8 % (ref 3.0–12.0)
Neutro Abs: 6.5 10*3/uL (ref 1.4–7.7)
Neutrophils Relative %: 76.7 % (ref 43.0–77.0)
Platelets: 278 10*3/uL (ref 150.0–400.0)
RBC: 4.57 Mil/uL (ref 3.87–5.11)
RDW: 14.4 % (ref 11.5–15.5)
WBC: 8.4 10*3/uL (ref 4.0–10.5)

## 2022-11-13 LAB — LAB REPORT - SCANNED: EGFR: 85

## 2022-11-13 LAB — LIPID PANEL
Cholesterol: 142 mg/dL (ref 0–200)
HDL: 44.8 mg/dL (ref >39.00)
LDL Cholesterol: 84 mg/dL (ref 0–99)
NonHDL: 96.96
Total CHOL/HDL Ratio: 3
Triglycerides: 63 mg/dL (ref 0.0–149.0)
VLDL: 12.6 mg/dL (ref 0.0–40.0)

## 2022-11-13 LAB — HEMOGLOBIN A1C: Hgb A1c MFr Bld: 6.1 % (ref 4.6–6.5)

## 2022-11-13 NOTE — Patient Instructions (Addendum)
Good to see you today . Will share lab results with team as possible.  BP in control .

## 2022-11-16 NOTE — Progress Notes (Unsigned)
Follow Up Note  RE: Amy Stokes MRN: [id number] DOB: 1950/08/30 Date of Office Visit: 11/17/2022  Referring provider: Madelin Headings, MD Primary care provider: Madelin Headings, MD  Chief Complaint: No chief complaint on file.  History of Present Illness: I had the pleasure of seeing Amy Stokes for a follow up visit at the [facility] on 11/16/2022. She is a 71 y.o. female, who is being followed for asthma on Fasenra, allergic rhinitis and GERD. Her previous allergy office visit was on 08/13/2022 with Dr. Selena Batten. Today is a regular follow up visit.  Not well controlled severe persistent asthma Started [facility]enger in June 2024. Past history - Diagnosed with asthma for 30 years.  Currently on Advair 230 mcg 2 puffs twice a day and uses albuterol as needed less than once a month however has daily symptoms.  Main triggers include dust.  Follows with pulmonology as well.  2023 blood work showed elevated eosinophils of 300 and IgE of 1409. 2023 spirometry showed severe restriction with no improvement FEV1 postbronchodilator treatment.  Clinically unchanged. Interim history - still wheezing daily but only uses albuterol a few times per week.  Today's spirometry showed restriction.  Discussed various biologics and gave handouts. Patient willing to start.  Start PA.  Follow up with pulmonology as scheduled.  Daily controller medication(s): Breztri 2 puffs with spacer and rinse mouth afterwards.  May use albuterol rescue inhaler 2 puffs or nebulizer every 4 to 6 hours as needed for shortness of breath, chest tightness, coughing, and wheezing. May use albuterol rescue inhaler 2 puffs 5 to 15 minutes prior to strenuous physical activities. Monitor frequency of use.  Get spirometry at next visit.   Other allergic rhinitis Past history - No prior ENT evaluation. 2023 skin testing showed: Positive to mold, cockroach, dust mites.  Interim history - controlled.  Continue  environmental control measures as below. Use over the counter antihistamines such as Zyrtec (cetirizine), Claritin (loratadine), Allegra (fexofenadine), or Xyzal (levocetirizine) daily as needed. May take twice a day during allergy flares. May switch antihistamines every few months. Continue Singulair (montelukast) 10mg  daily at night. Use Flonase (fluticasone) nasal spray 1 spray per nostril twice a day as needed for nasal congestion.  Nasal saline spray (i.e., Simply Saline) or nasal saline lavage (i.e., NeilMed) is recommended as needed and prior to medicated nasal sprays.   Gastroesophageal reflux disease Did not stop Protonix.  Continue Protonix daily as before.   Assessment and Plan: Amy Stokes is a 72 y.o. female with: ***  No follow-ups on file.  No orders of the defined types were placed in this encounter.  Lab Orders  No laboratory test(s) ordered today    Diagnostics: Spirometry:  Tracings reviewed. Her effort: {Blank single:19197::"Good reproducible efforts.","It was hard to get consistent efforts and there is a question as to whether this reflects a maximal maneuver.","Poor effort, data can not be interpreted."} FVC: ***L FEV1: ***L, ***% predicted FEV1/FVC ratio: ***% Interpretation: {Blank single:19197::"Spirometry consistent with mild obstructive disease","Spirometry consistent with moderate obstructive disease","Spirometry consistent with severe obstructive disease","Spirometry consistent with possible restrictive disease","Spirometry consistent with mixed obstructive and restrictive disease","Spirometry uninterpretable due to technique","Spirometry consistent with normal pattern","No overt abnormalities noted given today's efforts"}.  Please see scanned spirometry results for details.  Skin Testing: {Blank single:19197::"Select foods","Environmental allergy panel","Environmental allergy panel and select foods","Food allergy panel","None","Deferred due to recent  antihistamines use"}. *** Results discussed with patient/family.   Medication List:  Current Outpatient Medications  Medication Sig  Dispense Refill   acetaminophen (TYLENOL) 650 MG CR tablet Take 1,300 mg by mouth every 8 (eight) hours as needed for pain.     albuterol (VENTOLIN HFA) 108 (90 Base) MCG/ACT inhaler Inhale 2 puffs into the lungs every 4 (four) hours as needed for wheezing or shortness of breath (coughing fits). 18 g 1   apixaban (ELIQUIS) 5 MG TABS tablet TAKE 1 TABLET BY MOUTH TWICE A DAY 60 tablet 5   benralizumab (FASENRA) 10 MG/0.5ML prefilled syringe Inject as directed. Every 4 wks.     Budeson-Glycopyrrol-Formoterol (BREZTRI AEROSPHERE) 160-9-4.8 MCG/ACT AERO Inhale 2 puffs into the lungs in the morning and at bedtime. 10.7 g 5   Cholecalciferol 50 MCG (2000 UT) CAPS Take 1 capsule by mouth daily. PATIENT USING OIL AND NOT CAPSULES     diltiazem (CARDIZEM CD) 300 MG 24 hr capsule TAKE 1 CAPSULE BY MOUTH EVERY DAY 90 capsule 3   fexofenadine (ALLEGRA) 180 MG tablet TAKE 1 TABLET BY MOUTH EVERY DAY 30 tablet 5   flecainide (TAMBOCOR) 50 MG tablet TAKE 1 TABLET BY MOUTH TWICE A DAY 180 tablet 1   hydrochlorothiazide (HYDRODIURIL) 25 MG tablet TAKE 1 TABLET (25 MG TOTAL) BY MOUTH DAILY. 90 tablet 1   ipratropium-albuterol (DUONEB) 0.5-2.5 (3) MG/3ML SOLN Take 3 mLs by nebulization every 6 (six) hours as needed. (Patient not taking: Reported on 11/13/2022) 360 mL 0   levothyroxine (SYNTHROID) 100 MCG tablet TAKE 1 TABLET BY MOUTH EVERY MORNING ON AN EMPTY STOMACH Orally Once a day 90 days 90 tablet 4   losartan (COZAAR) 100 MG tablet Take 1 tablet (100 mg total) by mouth daily. 90 tablet 1   Lutein 10 MG TABS Take 10 mg by mouth daily.     MAGNESIUM CITRATE PO Take 250 mg by mouth.     montelukast (SINGULAIR) 10 MG tablet TAKE 1 TABLET BY MOUTH EVERYDAY AT BEDTIME 90 tablet 1   MULTIPLE VITAMIN PO Take 1 tablet by mouth daily.     Omega-3 Fatty Acids (FISH OIL) 1000 MG CAPS       pantoprazole (PROTONIX) 40 MG tablet TAKE 1 TABLET BY MOUTH EVERY DAY 90 tablet 1   potassium chloride (KLOR-CON M10) 10 MEQ tablet Take 1 tablet (10 mEq total) by mouth 3 (three) times daily. 270 tablet 0   rosuvastatin (CRESTOR) 10 MG tablet TAKE 1 TABLET BY MOUTH EVERY DAY 90 tablet 1   Spacer/Aero-Holding Chambers DEVI Use with spacer 1 each 2   valACYclovir (VALTREX) 500 MG tablet As needed     No current facility-administered medications for this visit.   Allergies: No Known Allergies I reviewed her past medical history, social history, family history, and environmental history and no significant changes have been reported from her previous visit.  Review of Systems  Constitutional:  Negative for appetite change, chills, fever and unexpected weight change.  HENT:  Negative for congestion and rhinorrhea.   Eyes:  Negative for itching.  Respiratory:  Positive for cough and wheezing. Negative for chest tightness and shortness of breath.   Cardiovascular:  Negative for chest pain.  Gastrointestinal:  Negative for abdominal pain.  Genitourinary:  Negative for difficulty urinating.  Skin:  Negative for rash.  Allergic/Immunologic: Positive for environmental allergies.  Neurological:  Negative for headaches.    Objective: There were no vitals taken for this visit. There is no height or weight on file to calculate BMI. Physical Exam Vitals and nursing note reviewed.  Constitutional:  Appearance: Normal appearance. She is well-developed. She is obese.  HENT:     Head: Normocephalic and atraumatic.     Right Ear: Tympanic membrane and external ear normal.     Left Ear: Tympanic membrane and external ear normal.     Nose: Nose normal.     Mouth/Throat:     Mouth: Mucous membranes are moist.     Pharynx: Oropharynx is clear.  Eyes:     Conjunctiva/sclera: Conjunctivae normal.  Cardiovascular:     Rate and Rhythm: Normal rate and regular rhythm.     Heart sounds: Normal  heart sounds. No murmur heard.    No friction rub. No gallop.  Pulmonary:     Effort: Pulmonary effort is normal.     Breath sounds: Normal breath sounds. No wheezing, rhonchi or rales.  Musculoskeletal:     Cervical back: Neck supple.  Skin:    General: Skin is warm.     Findings: No rash.  Neurological:     Mental Status: She is alert and oriented to person, place, and time.  Psychiatric:        Behavior: Behavior normal.    Previous notes and tests were reviewed. The plan was reviewed with the patient/family, and all questions/concerned were addressed.  It was my pleasure to see Tyrielle today and participate in her care. Please feel free to contact me with any questions or concerns.  Sincerely,  Wyline Mood, DO Allergy & Immunology  [facility] office: [phone] [facility] office: [phone]

## 2022-11-17 ENCOUNTER — Encounter: Payer: Self-pay | Admitting: Allergy

## 2022-11-17 ENCOUNTER — Ambulatory Visit: Payer: Medicare HMO | Admitting: Allergy

## 2022-11-17 VITALS — BP 122/64 | HR 64 | Temp 98.6°F

## 2022-11-17 DIAGNOSIS — K219 Gastro-esophageal reflux disease without esophagitis: Secondary | ICD-10-CM

## 2022-11-17 DIAGNOSIS — Z91038 Other insect allergy status: Secondary | ICD-10-CM | POA: Diagnosis not present

## 2022-11-17 DIAGNOSIS — J4550 Severe persistent asthma, uncomplicated: Secondary | ICD-10-CM

## 2022-11-17 DIAGNOSIS — J3089 Other allergic rhinitis: Secondary | ICD-10-CM | POA: Diagnosis not present

## 2022-11-17 NOTE — Patient Instructions (Addendum)
Asthma: Daily controller medication(s): Breztri 2 puffs twice a day with spacer and rinse mouth afterwards.  Continue Fasenra injections every 8 weeks at home.  Monitor wheezing episodes.  May use albuterol rescue inhaler 2 puffs or nebulizer every 4 to 6 hours as needed for shortness of breath, chest tightness, coughing, and wheezing. May use albuterol rescue inhaler 2 puffs 5 to 15 minutes prior to strenuous physical activities. Monitor frequency of use - if you need to use it more than twice per week on a consistent basis let us know.  Asthma control goals:  Full participation in all desired activities (may need albuterol before activity) Albuterol use two times or less a week on average (not counting use with activity) Cough interfering with sleep two times or less a month Oral steroids no more than once a year No hospitalizations   Environmental allergies 2023 skin testing showed: Positive to mold, cockroach, dust mites.  Continue environmental control measures.  Use over the counter antihistamines such as Zyrtec (cetirizine), Claritin (loratadine), Allegra (fexofenadine), or Xyzal (levocetirizine) daily as needed. May take twice a day during allergy flares. May switch antihistamines every few months. Continue Singulair (montelukast) 10mg  daily at night. Use Flonase (fluticasone) nasal spray 1-2 sprays per nostril once a day as needed for nasal congestion.  Nasal saline spray (i.e., Simply Saline) or nasal saline lavage (i.e., NeilMed) is recommended as needed and prior to medicated nasal sprays.  Reflux Continue Protonix daily as before.   Follow up in 4 months or sooner if needed.    Control of House Dust Mite Allergen Dust mite allergens are a common trigger of allergy and asthma symptoms. While they can be found throughout the house, these microscopic creatures thrive in warm, humid environments such as bedding, upholstered furniture and carpeting. Because so much time is spent  in the bedroom, it is essential to reduce mite levels there.  Encase pillows, mattresses, and box springs in special allergen-proof fabric covers or airtight, zippered plastic covers.  Bedding should be washed weekly in hot water (130 F) and dried in a hot dryer. Allergen-proof covers are available for comforters and pillows that can't be regularly washed.  Wash the allergy-proof covers every few months. Minimize clutter in the bedroom. Keep pets out of the bedroom.  Keep humidity less than 50% by using a dehumidifier or air conditioning. You can buy a humidity measuring device called a hygrometer to monitor this.  If possible, replace carpets with hardwood, linoleum, or washable area rugs. If that's not possible, vacuum frequently with a vacuum that has a HEPA filter. Remove all upholstered furniture and non-washable window drapes from the bedroom. Remove all non-washable stuffed toys from the bedroom.  Wash stuffed toys weekly. Mold Control Mold and fungi can grow on a variety of surfaces provided certain temperature and moisture conditions exist.  Outdoor molds grow on plants, decaying vegetation and soil. The major outdoor mold, Alternaria and Cladosporium, are found in very high numbers during hot and dry conditions. Generally, a late summer - fall peak is seen for common outdoor fungal spores. Rain will temporarily lower outdoor mold spore count, but counts rise rapidly when the rainy period ends. The most important indoor molds are Aspergillus and Penicillium. Dark, humid and poorly ventilated basements are ideal sites for mold growth. The next most common sites of mold growth are the bathroom and the kitchen. Outdoor (Seasonal) Mold Control Use air conditioning and keep windows closed. Avoid exposure to decaying vegetation. Avoid leaf raking. Avoid  grain handling. Consider wearing a face mask if working in moldy areas.  Indoor (Perennial) Mold Control  Maintain humidity below 50%. Get  rid of mold growth on hard surfaces with water, detergent and, if necessary, 5% bleach (do not mix with other cleaners). Then dry the area completely. If mold covers an area more than 10 square feet, consider hiring an indoor environmental professional. For clothing, washing with soap and water is best. If moldy items cannot be cleaned and dried, throw them away. Remove sources e.g. contaminated carpets. Repair and seal leaking roofs or pipes. Using dehumidifiers in damp basements may be helpful, but empty the water and clean units regularly to prevent mildew from forming. All rooms, especially basements, bathrooms and kitchens, require ventilation and cleaning to deter mold and mildew growth. Avoid carpeting on concrete or damp floors, and storing items in damp areas. Cockroach Allergen Avoidance Cockroaches are often found in the homes of densely populated urban areas, schools or commercial buildings, but these creatures can lurk almost anywhere. This does not mean that you have a dirty house or living area. Block all areas where roaches can enter the home. This includes crevices, wall cracks and windows.  Cockroaches need water to survive, so fix and seal all leaky faucets and pipes. Have an exterminator go through the house when your family and pets are gone to eliminate any remaining roaches. Keep food in lidded containers and put pet food dishes away after your pets are done eating. Vacuum and sweep the floor after meals, and take out garbage and recyclables. Use lidded garbage containers in the kitchen. Wash dishes immediately after use and clean under stoves, refrigerators or toasters where crumbs can accumulate. Wipe off the stove and other kitchen surfaces and cupboards regularly.

## 2022-11-17 NOTE — Progress Notes (Signed)
Results are  normal or all in range . Sharing info with team

## 2022-11-21 ENCOUNTER — Other Ambulatory Visit: Payer: Self-pay | Admitting: Pulmonary Disease

## 2022-11-21 DIAGNOSIS — R058 Other specified cough: Secondary | ICD-10-CM

## 2022-11-24 DIAGNOSIS — I10 Essential (primary) hypertension: Secondary | ICD-10-CM | POA: Diagnosis not present

## 2022-11-24 DIAGNOSIS — I480 Paroxysmal atrial fibrillation: Secondary | ICD-10-CM | POA: Diagnosis not present

## 2022-11-24 DIAGNOSIS — R7301 Impaired fasting glucose: Secondary | ICD-10-CM | POA: Diagnosis not present

## 2022-11-24 DIAGNOSIS — E049 Nontoxic goiter, unspecified: Secondary | ICD-10-CM | POA: Diagnosis not present

## 2022-11-24 DIAGNOSIS — E02 Subclinical iodine-deficiency hypothyroidism: Secondary | ICD-10-CM | POA: Diagnosis not present

## 2022-11-26 ENCOUNTER — Telehealth (HOSPITAL_BASED_OUTPATIENT_CLINIC_OR_DEPARTMENT_OTHER): Payer: Self-pay

## 2022-11-26 DIAGNOSIS — I480 Paroxysmal atrial fibrillation: Secondary | ICD-10-CM

## 2022-11-26 MED ORDER — APIXABAN 5 MG PO TABS
5.0000 mg | ORAL_TABLET | Freq: Two times a day (BID) | ORAL | 3 refills | Status: DC
Start: 2022-11-26 — End: 2023-07-20

## 2022-11-26 NOTE — Telephone Encounter (Signed)
Patient dropped off Eliquis patient assitance forms, filled out. Rx printed, will have provider sign upon her return Friday.

## 2022-11-27 NOTE — Telephone Encounter (Signed)
Patient dropped off tax forms for Eliquis patient assistance, added to application and placed in provider folder for signing.

## 2022-11-30 DIAGNOSIS — R69 Illness, unspecified: Secondary | ICD-10-CM | POA: Diagnosis not present

## 2022-12-02 ENCOUNTER — Ambulatory Visit: Payer: Medicare HMO | Admitting: Pulmonary Disease

## 2022-12-02 ENCOUNTER — Encounter: Payer: Self-pay | Admitting: Pulmonary Disease

## 2022-12-02 VITALS — BP 130/64 | HR 70 | Ht 67.0 in | Wt 294.2 lb

## 2022-12-02 DIAGNOSIS — G4733 Obstructive sleep apnea (adult) (pediatric): Secondary | ICD-10-CM

## 2022-12-02 NOTE — Patient Instructions (Signed)
I will see you a year from now  As we discussed regarding de-escalating your medications You can stop Protonix or Singulair in a graded fashion -Stop one of them for about 2 weeks before you try to stop the other  Pay attention to your symptoms and if escalation of symptoms then you know that you should get back to using them  Call us with significant concerns  Regular exercises as tolerated

## 2022-12-02 NOTE — Progress Notes (Signed)
Amy Stokes    [id number]    Jun 20, 1950  Primary Care Physician:Panosh, Neta Mends, MD  Referring Physician: Madelin Headings, MD [address] [place],  [place] [address]  Chief complaint:   Known obstructive sleep apnea Breathing remains stable HPI:  Continues to tolerate CPAP well -6 to 8 hours of sleep Wakes up feeling like she is at a good nights rest  History of multiple allergies History of asthma -Breathing has been relatively stable -Continues Breztri -Rare need for albuterol  Exercise tolerance has been stable  Follows up with allergist  Weight has been stable  Reformed smoker, quit in 1973  Has not been wheezing or has not had unusual shortness of breath  IgE levels remain elevated -Did recently talk with allergist regarding levels as well  Outpatient Encounter Medications as of 12/02/2022  Medication Sig   acetaminophen (TYLENOL) 650 MG CR tablet Take 1,300 mg by mouth every 8 (eight) hours as needed for pain.   albuterol (VENTOLIN HFA) 108 (90 Base) MCG/ACT inhaler Inhale 2 puffs into the lungs every 4 (four) hours as needed for wheezing or shortness of breath (coughing fits).   apixaban (ELIQUIS) 5 MG TABS tablet Take 1 tablet (5 mg total) by mouth 2 (two) times daily.   benralizumab (FASENRA) 10 MG/0.5ML prefilled syringe Inject as directed. Every 4 wks.   Budeson-Glycopyrrol-Formoterol (BREZTRI AEROSPHERE) 160-9-4.8 MCG/ACT AERO Inhale 2 puffs into the lungs in the morning and at bedtime.   Cholecalciferol 50 MCG (2000 UT) CAPS Take 1 capsule by mouth daily. PATIENT USING OIL AND NOT CAPSULES   diltiazem (CARDIZEM CD) 300 MG 24 hr capsule TAKE 1 CAPSULE BY MOUTH EVERY DAY   fexofenadine (ALLEGRA) 180 MG tablet TAKE 1 TABLET BY MOUTH EVERY DAY   flecainide (TAMBOCOR) 50 MG tablet TAKE 1 TABLET BY MOUTH TWICE A DAY   hydrochlorothiazide (HYDRODIURIL) 25 MG tablet TAKE 1 TABLET (25 MG TOTAL) BY MOUTH DAILY.   ipratropium-albuterol  (DUONEB) 0.5-2.5 (3) MG/3ML SOLN Take 3 mLs by nebulization every 6 (six) hours as needed.   levothyroxine (SYNTHROID) 100 MCG tablet TAKE 1 TABLET BY MOUTH EVERY MORNING ON AN EMPTY STOMACH Orally Once a day 90 days   losartan (COZAAR) 100 MG tablet Take 1 tablet (100 mg total) by mouth daily.   Lutein 10 MG TABS Take 10 mg by mouth daily.   MAGNESIUM CITRATE PO Take 250 mg by mouth.   montelukast (SINGULAIR) 10 MG tablet TAKE 1 TABLET BY MOUTH EVERYDAY AT BEDTIME   MULTIPLE VITAMIN PO Take 1 tablet by mouth daily.   Omega-3 Fatty Acids (FISH OIL) 1000 MG CAPS    pantoprazole (PROTONIX) 40 MG tablet TAKE 1 TABLET BY MOUTH EVERY DAY   potassium chloride (KLOR-CON M10) 10 MEQ tablet Take 1 tablet (10 mEq total) by mouth 3 (three) times daily.   rosuvastatin (CRESTOR) 10 MG tablet TAKE 1 TABLET BY MOUTH EVERY DAY   Spacer/Aero-Holding Chambers DEVI Use with spacer   valACYclovir (VALTREX) 500 MG tablet As needed   No facility-administered encounter medications on file as of 12/02/2022.    Allergies as of 12/02/2022   (No Known Allergies)    Past Medical History:  Diagnosis Date   Arthritis    no meds   Asthma    Edema    Fibroids    Hyperlipidemia    Hypertension    Obesity    RML pneumonia 02/25/2021   Sleep apnea  wears cpap    Thyroid nodule 01/2009   left discovered on chest ct; needle bx "indeterminant" cannon r/o follicular neoplasm     Past Surgical History:  Procedure Laterality Date   ABDOMINAL HYSTERECTOMY Bilateral 08/02/2013   Procedure: TOTAL ABDOMINAL HYSTERECTOMY WITH BILATERAL SALPINGO-OOPHORECTOMY ;  Surgeon: Ok Edwards, MD;  Location: [facility];  Service: Gynecology;  Laterality: Bilateral;    Dr. Audie Box to assist.  Dr. Abbey Chatters to follow with an umbilical hernia repair. He will need an additional 1 1/2 hours.    COLONOSCOPY  12/27/2007   [facility]    HERNIA REPAIR     MOUTH SURGERY     thyroid needle bx     TONSILLECTOMY     UMBILICAL HERNIA  REPAIR N/A 08/02/2013   Procedure: HERNIA REPAIR UMBILICAL ADULT WITH MESH;  Surgeon: Adolph Pollack, MD;  Location: [facility];  Service: General;  Laterality: N/A;    Family History  Problem Relation Age of Onset   Arthritis Mother    Other Mother        neurological disorder   Pancreatic cancer Mother    Heart disease Father    Thyroid nodules Sister    Heart disease Brother        x2 1 brother deceased   Colon cancer Neg Hx    Colon polyps Neg Hx    Esophageal cancer Neg Hx    Rectal cancer Neg Hx    Stomach cancer Neg Hx     Social History   Socioeconomic History   Marital status: Married    Spouse name: Not on file   Number of children: 0   Years of education: Not on file   Highest education level: Associate degree: academic program  Occupational History   Occupation: retired  Tobacco Use   Smoking status: Former    Current packs/day: 0.00    Average packs/day: 1 pack/day for 1 year (1.0 ttl pk-yrs)    Types: Cigarettes    Start date: 04/28/1970    Quit date: 04/29/1971    Years since quitting: 51.6    Passive exposure: Never   Smokeless tobacco: Never  Vaping Use   Vaping status: Never Used  Substance and Sexual Activity   Alcohol use: Not Currently    Comment: occasionally   Drug use: No   Sexual activity: Not on file  Other Topics Concern   Not on file  Social History Narrative   Married   Occupation: Advertising account executive currently unemployed   Married   Regular exercise-no   HH of 2   Pet cat      Social Determinants of Health   Financial Resource Strain: Low Risk  (11/12/2022)   Overall Financial Resource Strain (CARDIA)    Difficulty of Paying Living Expenses: Not very hard  Food Insecurity: No Food Insecurity (11/12/2022)   Hunger Vital Sign    Worried About Running Out of Food in the Last Year: Never true    Ran Out of Food in the Last Year: Never true  Transportation Needs: No Transportation Needs (11/12/2022)   PRAPARE - Doctor, general practice (Medical): No    Lack of Transportation (Non-Medical): No  Physical Activity: Sufficiently Active (11/12/2022)   Exercise Vital Sign    Days of Exercise per Week: 3 days    Minutes of Exercise per Session: 90 min  Stress: No Stress Concern Present (11/12/2022)   [employer] of Occupational Health - Occupational Stress Questionnaire  Feeling of Stress : Only a little  Social Connections: Moderately Isolated (11/12/2022)   Social Connection and Isolation Panel [NHANES]    Frequency of Communication with Friends and Family: Once a week    Frequency of Social Gatherings with Friends and Family: Once a week    Attends Religious Services: Never    Database administrator or Organizations: No    Attends Engineer, structural: More than 4 times per year    Marital Status: Married  Catering manager Violence: Not At Risk (07/23/2022)   Humiliation, Afraid, Rape, and Kick questionnaire    Fear of Current or Ex-Partner: No    Emotionally Abused: No    Physically Abused: No    Sexually Abused: No    Review of Systems  Constitutional:  Negative for fatigue.  HENT: Negative.    Eyes: Negative.   Respiratory: Negative.  Negative for shortness of breath.   Cardiovascular: Negative.  Negative for leg swelling.  Gastrointestinal: Negative.   Endocrine: Negative.   Genitourinary: Negative.     Vitals:   12/02/22 1356  BP: 130/64  Pulse: 70  SpO2: 95%     Physical Exam Constitutional:      Appearance: She is well-developed. She is obese.  HENT:     Head: Normocephalic and atraumatic.     Mouth/Throat:     Mouth: Mucous membranes are moist.  Eyes:     General:        Right eye: No discharge.        Left eye: No discharge.     Conjunctiva/sclera: Conjunctivae normal.  Neck:     Thyroid: No thyromegaly.     Trachea: No tracheal deviation.  Cardiovascular:     Rate and Rhythm: Normal rate and regular rhythm.  Pulmonary:     Effort: Pulmonary effort is  normal. No respiratory distress.     Breath sounds: No stridor. No wheezing or rhonchi.  Musculoskeletal:     Cervical back: No rigidity or tenderness.  Neurological:     Mental Status: She is alert.  Psychiatric:        Mood and Affect: Mood normal.       05/03/2018    2:00 PM  Results of the Epworth flowsheet  Sitting and reading 1  Watching TV 1  Sitting, inactive in a public place (e.g. a theatre or a meeting) 0  As a passenger in a car for an hour without a break 0  Lying down to rest in the afternoon when circumstances permit 0  Sitting and talking to someone 0  Sitting quietly after a lunch without alcohol 0  In a car, while stopped for a few minutes in traffic 0  Total score 2    Data Reviewed:  Compliance data shows 99% compliance Set pressure of 4-16 95 percentile pressure of 10.2 Residual AHI 1.7  IgE level of 2005 Eosinophil 2900  Assessment:  Obstructive sleep apnea -Stable symptoms Benefiting from CPAP use  Class III obesity -Working on weight loss efforts  History of asthma -Symptoms are controlled -No significant exacerbations  Cough and wheezing have been better  Wondered whether she can stop-Singulair and Protonix -She will sequentially stop from each one  Plan/Recommendations:  .  Continue CPAP nightly  .  Graded activities as tolerated  .  Continue bronchodilators  .  Follow-up in a year   Virl Diamond MD [facility] 12/02/2022, 2:02 PM  CC: Panosh, Neta Mends, MD

## 2022-12-05 ENCOUNTER — Telehealth (HOSPITAL_BASED_OUTPATIENT_CLINIC_OR_DEPARTMENT_OTHER): Payer: Self-pay | Admitting: Cardiology

## 2022-12-05 MED ORDER — APIXABAN 5 MG PO TABS: 5.0000 mg | ORAL_TABLET | Freq: Two times a day (BID) | ORAL | Status: DC

## 2022-12-05 NOTE — Telephone Encounter (Signed)
2 box Eliquis samples given to patient

## 2022-12-05 NOTE — Telephone Encounter (Signed)
Hello,  Patient came in because she is out of Eliquis. She wanted to know if we have any Samples

## 2022-12-11 DIAGNOSIS — Z9989 Dependence on other enabling machines and devices: Secondary | ICD-10-CM | POA: Diagnosis not present

## 2022-12-11 DIAGNOSIS — G4733 Obstructive sleep apnea (adult) (pediatric): Secondary | ICD-10-CM | POA: Diagnosis not present

## 2022-12-23 NOTE — Telephone Encounter (Signed)
Called in reference to her Eliquis application updated 9/11, approved 9/13, medication delivered via [employer] 9/17

## 2022-12-25 DIAGNOSIS — H40013 Open angle with borderline findings, low risk, bilateral: Secondary | ICD-10-CM | POA: Diagnosis not present

## 2022-12-30 DIAGNOSIS — R69 Illness, unspecified: Secondary | ICD-10-CM | POA: Diagnosis not present

## 2023-01-12 ENCOUNTER — Other Ambulatory Visit: Payer: Self-pay | Admitting: Internal Medicine

## 2023-01-12 DIAGNOSIS — Z1231 Encounter for screening mammogram for malignant neoplasm of breast: Secondary | ICD-10-CM

## 2023-01-21 ENCOUNTER — Other Ambulatory Visit (HOSPITAL_BASED_OUTPATIENT_CLINIC_OR_DEPARTMENT_OTHER): Payer: Self-pay | Admitting: Family

## 2023-01-30 ENCOUNTER — Other Ambulatory Visit: Payer: Self-pay | Admitting: Internal Medicine

## 2023-01-30 DIAGNOSIS — R69 Illness, unspecified: Secondary | ICD-10-CM | POA: Diagnosis not present

## 2023-01-31 ENCOUNTER — Other Ambulatory Visit: Payer: Self-pay | Admitting: Family

## 2023-01-31 ENCOUNTER — Other Ambulatory Visit: Payer: Self-pay | Admitting: Cardiology

## 2023-02-04 ENCOUNTER — Encounter: Payer: Self-pay | Admitting: Internal Medicine

## 2023-02-04 MED ORDER — POTASSIUM CHLORIDE CRYS ER 10 MEQ PO TBCR: 10.0000 meq | EXTENDED_RELEASE_TABLET | Freq: Three times a day (TID) | ORAL | 0 refills | Status: DC

## 2023-02-04 MED ORDER — HYDROCHLOROTHIAZIDE 25 MG PO TABS: 25.0000 mg | ORAL_TABLET | Freq: Every day | ORAL | 1 refills | Status: DC

## 2023-02-20 ENCOUNTER — Telehealth (HOSPITAL_BASED_OUTPATIENT_CLINIC_OR_DEPARTMENT_OTHER): Payer: Self-pay | Admitting: Cardiology

## 2023-02-20 NOTE — Telephone Encounter (Signed)
Patient dropped off an application for [employer]. Put paperwork in Dr Di Kindle box.

## 2023-02-26 ENCOUNTER — Other Ambulatory Visit: Payer: Self-pay

## 2023-02-26 ENCOUNTER — Encounter (HOSPITAL_BASED_OUTPATIENT_CLINIC_OR_DEPARTMENT_OTHER): Payer: Self-pay

## 2023-02-26 ENCOUNTER — Emergency Department (HOSPITAL_BASED_OUTPATIENT_CLINIC_OR_DEPARTMENT_OTHER): Payer: Medicare HMO

## 2023-02-26 ENCOUNTER — Emergency Department (HOSPITAL_BASED_OUTPATIENT_CLINIC_OR_DEPARTMENT_OTHER)
Admission: EM | Admit: 2023-02-26 | Discharge: 2023-02-26 | Disposition: A | Discharge status: Home / Self Care | Payer: Medicare HMO | Attending: Emergency Medicine | Admitting: Emergency Medicine

## 2023-02-26 DIAGNOSIS — W010XXA Fall on same level from slipping, tripping and stumbling without subsequent striking against object, initial encounter: Secondary | ICD-10-CM | POA: Diagnosis not present

## 2023-02-26 DIAGNOSIS — S8002XA Contusion of left knee, initial encounter: Secondary | ICD-10-CM | POA: Diagnosis not present

## 2023-02-26 DIAGNOSIS — Z7901 Long term (current) use of anticoagulants: Secondary | ICD-10-CM | POA: Diagnosis not present

## 2023-02-26 DIAGNOSIS — M25562 Pain in left knee: Secondary | ICD-10-CM | POA: Diagnosis not present

## 2023-02-26 DIAGNOSIS — Z79899 Other long term (current) drug therapy: Secondary | ICD-10-CM | POA: Diagnosis not present

## 2023-02-26 DIAGNOSIS — M79661 Pain in right lower leg: Secondary | ICD-10-CM | POA: Diagnosis not present

## 2023-02-26 MED ORDER — ACETAMINOPHEN 500 MG PO TABS
1000.0000 mg | ORAL_TABLET | Freq: Once | ORAL | Status: DC
  Filled 2023-02-26: qty 2

## 2023-02-26 MED ORDER — OXYCODONE HCL 5 MG PO TABS
5.0000 mg | ORAL_TABLET | Freq: Once | ORAL | Status: AC
  Administered 2023-02-26: 5 mg via ORAL
  Filled 2023-02-26: qty 1

## 2023-02-26 MED ORDER — DICLOFENAC SODIUM 1 % EX GEL: 4.0000 g | Freq: Four times a day (QID) | CUTANEOUS | 0 refills | Status: DC

## 2023-02-26 NOTE — ED Triage Notes (Signed)
Pt complaining of tripping and falling on concrete pavement.  Injuring her left knee and right chin. Bruising to left knee and abraision to the chin. Happened about 4 pm

## 2023-02-26 NOTE — Discharge Instructions (Signed)
Follow up with your doctor in the office.   Use the gel as prescribed Also take tylenol 1000mg (2 extra strength) four times a day.

## 2023-02-26 NOTE — ED Provider Notes (Signed)
[facility] EMERGENC[facility] DEPARTMENT AT [facility] Provider Note   CSN: [id number] Arrival date & time: 02/26/23  1858     History  Chief Complaint  Patient presents with   Amy Stokes is a 72 y.o. female.  72 yo F with a chief complaint of left knee pain.  The patient said that she was try to go up a couple steps and her foot got stuck and she fell down.  She is on Eliquis.  Pain mostly of left knee pain.  Has some pain to the right shin.  Denies head injury denies loss of consciousness denies neck pain back pain chest pain abdominal pain.   Fall       Home Medications Prior to Admission medications   Medication Sig Start Date End Date Taking? Authorizing Provider  diclofenac Sodium (VOLTAREN) 1 % GEL Apply 4 g topically 4 (four) times daily. 02/26/23  Yes Melene Plan, DO  acetaminophen (TYLENOL) 650 MG CR tablet Take 1,300 mg by mouth every 8 (eight) hours as needed for pain.    [provider]  albuterol (VENTOLIN HFA) 108 (90 Base) MCG/ACT inhaler Inhale 2 puffs into the lungs every 4 (four) hours as needed for wheezing or shortness of breath (coughing fits). 08/13/22   Ellamae Sia, DO  apixaban (ELIQUIS) 5 MG TABS tablet Take 1 tablet (5 mg total) by mouth 2 (two) times daily. 11/26/22   Jodelle Red, MD  apixaban (ELIQUIS) 5 MG TABS tablet Take 1 tablet (5 mg total) by mouth 2 (two) times daily. 12/05/22   Jodelle Red, MD  benralizumab [facility]) 10 MG/0.5ML prefilled syringe Inject as directed. Every 4 wks. 10/03/22   [provider]  Budeson-Glycopyrrol-Formoterol (BREZTRI AEROSPHERE) 160-9-4.8 MCG/ACT AERO Inhale 2 puffs into the lungs in the morning and at bedtime. 08/13/22   Ellamae Sia, DO  Cholecalciferol 50 MCG (2000 UT) CAPS Take 1 capsule by mouth daily. PATIENT USING OIL AND NOT CAPSULES    [provider]  diltiazem (CARDIZEM CD) 300 MG 24 hr capsule TAKE 1 CAPSULE BY MOUTH EVERY DAY 11/05/22   Jodelle Red, MD  fexofenadine (ALLEGRA) 180 MG tablet TAKE 1 TABLET BY MOUTH EVERY DAY 06/16/22   Cobb, Ruby Cola, NP  flecainide (TAMBOCOR) 50 MG tablet TAKE 1 TABLET BY MOUTH TWICE A DAY 02/02/23   Jodelle Red, MD  hydrochlorothiazide (HYDRODIURIL) 25 MG tablet Take 1 tablet (25 mg total) by mouth daily. 02/04/23   Panosh, Neta Mends, MD  ipratropium-albuterol (DUONEB) 0.5-2.5 (3) MG/3ML SOLN Take 3 mLs by nebulization every 6 (six) hours as needed. 02/28/21   Rolly Salter, MD  levothyroxine (SYNTHROID) 100 MCG tablet TAKE 1 TABLET BY MOUTH EVERY MORNING ON AN EMPTY STOMACH Orally Once a day 90 days 04/16/21     losartan (COZAAR) 100 MG tablet TAKE 1 TABLET BY MOUTH EVERY DAY 01/30/23   Worthy Rancher B, FNP  Lutein 10 MG TABS Take 10 mg by mouth daily.    [provider]  MAGNESIUM CITRATE PO Take 250 mg by mouth.    [provider]  montelukast (SINGULAIR) 10 MG tablet TAKE 1 TABLET BY MOUTH EVERYDAY AT BEDTIME 10/20/22   Ellamae Sia, DO  MULTIPLE VITAMIN PO Take 1 tablet by mouth daily.    [provider]  Omega-3 Fatty Acids (FISH OIL) 1000 MG CAPS  11/03/22   [provider]  pantoprazole (PROTONIX) 40 MG tablet TAKE 1 TABLET BY MOUTH  EVERY DAY 11/27/22   Virl Diamond A, MD  potassium chloride (KLOR-CON M10) 10 MEQ tablet Take 1 tablet (10 mEq total) by mouth 3 (three) times daily. 02/04/23   Panosh, Neta Mends, MD  rosuvastatin (CRESTOR) 10 MG tablet TAKE 1 TABLET BY MOUTH EVERY DAY 01/21/23   Alver Sorrow, NP  Spacer/Aero-Holding Deretha [facility] Use with spacer 06/04/21   Cobb, Ruby Cola, NP  valACYclovir (VALTREX) 500 MG tablet As needed 04/08/21   [provider]      Allergies    Patient has no known allergies.    Review of Systems   Review of Systems  Physical Exam Updated Vital Signs BP (!) 151/56   Pulse 67   Temp 97.7 F (36.5 C) (Oral)   Resp 17   Ht 5\' 7"  (1.702 m)   Wt 129.3 kg   SpO2 94%   BMI 44.64 kg/m  Physical  Exam Vitals and nursing note reviewed.  Constitutional:      General: She is not in acute distress.    Appearance: She is well-developed. She is not diaphoretic.     Comments: BMI 45  HENT:     Head: Normocephalic and atraumatic.  Eyes:     Pupils: Pupils are equal, round, and reactive to light.  Cardiovascular:     Rate and Rhythm: Normal rate and regular rhythm.     Heart sounds: No murmur heard.    No friction rub. No gallop.  Pulmonary:     Effort: Pulmonary effort is normal.     Breath sounds: No wheezing or rales.  Abdominal:     General: There is no distension.     Palpations: Abdomen is soft.     Tenderness: There is no abdominal tenderness.  Musculoskeletal:        General: No tenderness.     Cervical back: Normal range of motion and neck supple.  Skin:    General: Skin is warm and dry.     Comments: Some erythema to bilateral shins that appears to be chronic skin changes.  No obvious break in the skin of the knee.  She does have some bruising and edema there.  She with a range it without significant discomfort.  Pulse motor and sensation intact distally.  No obvious ligamentous laxity though pain somewhat limits range of motion.  Neurological:     Mental Status: She is alert and oriented to person, place, and time.  Psychiatric:        Behavior: Behavior normal.     ED Results / Procedures / Treatments   Labs (all labs ordered are listed, but only abnormal results are displayed) Labs Reviewed - No data to display  EKG None  Radiology DG Knee Complete 4 Views Left  Result Date: 02/26/2023 CLINICAL DATA:  Tripped and fell on concrete.  Bruising to left knee EXAM: LEFT KNEE - COMPLETE 4+ VIEW COMPARISON:  None Available. FINDINGS: No acute fracture or dislocation. Mild-to-moderate medial compartment narrowing with marginal spurring. No knee joint effusion. Soft tissue swelling anteriorly. IMPRESSION: 1. No acute fracture or dislocation. Electronically Signed   By:  Minerva Fester M.D.   On: 02/26/2023 19:52    Procedures Procedures    Medications Ordered in ED Medications  acetaminophen (TYLENOL) tablet 1,000 mg (1,000 mg Oral Not Given 02/26/23 1934)  oxyCODONE (Oxy IR/ROXICODONE) immediate release tablet 5 mg (5 mg Oral Given 02/26/23 1917)    ED Course/ Medical Decision Making/ A&P  Medical Decision Making Amount and/or Complexity of Data Reviewed Radiology: ordered.  Risk OTC drugs. Prescription drug management.   72 yo F with a chief complaint of left knee pain.  Patient suffered what sounds like a nonsyncopal fall by history complaining of pain and swelling to the left knee.  Plain film independently interpreted by me without fracture or dislocation.  Placed in an Ace wrap.  I feel crutches would likely make things worse.  Will have her follow-up with her doctor in the office.  9:25 PM:  I have discussed the diagnosis/risks/treatment options with the patient and family.  Evaluation and diagnostic testing in the emergency department does not suggest an emergent condition requiring admission or immediate intervention beyond what has been performed at this time.  They will follow up with PCP. We also discussed returning to the ED immediately if new or worsening sx occur. We discussed the sx which are most concerning (e.g., sudden worsening pain, fever, inability to tolerate by mouth) that necessitate immediate return. Medications administered to the patient during their visit and any new prescriptions provided to the patient are listed below.  Medications given during this visit Medications  acetaminophen (TYLENOL) tablet 1,000 mg (1,000 mg Oral Not Given 02/26/23 1934)  oxyCODONE (Oxy IR/ROXICODONE) immediate release tablet 5 mg (5 mg Oral Given 02/26/23 1917)     The patient appears reasonably screen and/or stabilized for discharge and I doubt any other medical condition or other [facility] requiring further  screening, evaluation, or treatment in the ED at this time prior to discharge.          Final Clinical Impression(s) / ED Diagnoses Final diagnoses:  Acute pain of left knee    Rx / DC Orders ED Discharge Orders          Ordered    diclofenac Sodium (VOLTAREN) 1 % GEL  4 times daily        02/26/23 1958              Melene Plan, DO 02/26/23 2125

## 2023-02-27 ENCOUNTER — Telehealth: Payer: Self-pay

## 2023-02-27 NOTE — Transitions of Care (Post Inpatient/ED Visit) (Signed)
   02/27/2023  Name: AMIYHA BUMPUS MRN: [id number] DOB: [birth year]  Today's TOC FU Call Status: Today's TOC FU Call Status:: Unsuccessful Call (1st Attempt) Unsuccessful Call (1st Attempt) Date: 02/27/23  Attempted to reach the patient regarding the most recent Inpatient/ED visit.  Follow Up Plan: Additional outreach attempts will be made to reach the patient to complete the Transitions of Care (Post Inpatient/ED visit) call.   Signature  Kandis Fantasia, LPN [facility] Advisor [place] [facility] You Are. We Are. [facility] Direct Dial [phone]

## 2023-03-03 ENCOUNTER — Telehealth: Payer: Self-pay | Admitting: Internal Medicine

## 2023-03-03 NOTE — Telephone Encounter (Signed)
Pt decline to make er fup appt

## 2023-03-04 ENCOUNTER — Ambulatory Visit (HOSPITAL_BASED_OUTPATIENT_CLINIC_OR_DEPARTMENT_OTHER): Payer: Medicare HMO | Admitting: Family Medicine

## 2023-03-04 ENCOUNTER — Ambulatory Visit (HOSPITAL_BASED_OUTPATIENT_CLINIC_OR_DEPARTMENT_OTHER): Payer: Medicare HMO

## 2023-03-04 ENCOUNTER — Ambulatory Visit (HOSPITAL_BASED_OUTPATIENT_CLINIC_OR_DEPARTMENT_OTHER): Payer: Medicare HMO | Admitting: Student

## 2023-03-04 DIAGNOSIS — M25562 Pain in left knee: Secondary | ICD-10-CM

## 2023-03-04 DIAGNOSIS — S8002XA Contusion of left knee, initial encounter: Secondary | ICD-10-CM

## 2023-03-04 DIAGNOSIS — M1712 Unilateral primary osteoarthritis, left knee: Secondary | ICD-10-CM | POA: Diagnosis not present

## 2023-03-04 MED ORDER — TRAMADOL HCL 50 MG PO TABS: 50.0000 mg | ORAL_TABLET | Freq: Four times a day (QID) | ORAL | 0 refills | Status: AC | PRN

## 2023-03-04 NOTE — Progress Notes (Signed)
Chief Complaint: Left knee pain     History of Present Illness:    Amy Stokes is a 72 y.o. female presenting today with with left knee pain.  She reports an injury on 11/28 which she fell forward directly onto her knees while going up steps.  Her left knee hit harder than the right.  She was evaluated in the ED and was given Ace wrap compression after x-rays were negative for fracture.  Today she rates her pain at a 9/10.  She is still having difficulty weightbearing and is ambulating with a cane.  She is on anticoagulation daily with Eliquis and reports significant bruising and swelling around the knee.  Describes pain as sharp but denies any numbness or tingling.  No previous history of knee injuries.   Surgical History:   None  PMH/PSH/Family History/Social History/Meds/Allergies:    Past Medical History:  Diagnosis Date   Arthritis    no meds   Asthma    Edema    Fibroids    Hyperlipidemia    Hypertension    Obesity    RML pneumonia 02/25/2021   Sleep apnea    wears cpap    Thyroid nodule 01/2009   left discovered on chest ct; needle bx "indeterminant" cannon r/o follicular neoplasm    Past Surgical History:  Procedure Laterality Date   ABDOMINAL HYSTERECTOMY Bilateral 08/02/2013   Procedure: TOTAL ABDOMINAL HYSTERECTOMY WITH BILATERAL SALPINGO-OOPHORECTOMY ;  Surgeon: Ok Edwards, MD;  Location: [facility];  Service: Gynecology;  Laterality: Bilateral;    Dr. Audie Box to assist.  Dr. Abbey Chatters to follow with an umbilical hernia repair. He will need an additional 1 1/2 hours.    COLONOSCOPY  12/27/2007   [facility]    HERNIA REPAIR     MOUTH SURGERY     thyroid needle bx     TONSILLECTOMY     UMBILICAL HERNIA REPAIR N/A 08/02/2013   Procedure: HERNIA REPAIR UMBILICAL ADULT WITH MESH;  Surgeon: Adolph Pollack, MD;  Location: [facility];  Service: General;  Laterality: N/A;   Social History   Socioeconomic History   Marital  status: Married    Spouse name: Not on file   Number of children: 0   Years of education: Not on file   Highest education level: Associate degree: academic program  Occupational History   Occupation: retired  Tobacco Use   Smoking status: Former    Current packs/day: 0.00    Average packs/day: 1 pack/day for 1 year (1.0 ttl pk-yrs)    Types: Cigarettes    Start date: 04/28/1970    Quit date: 04/29/1971    Years since quitting: 51.8    Passive exposure: Never   Smokeless tobacco: Never  Vaping Use   Vaping status: Never Used  Substance and Sexual Activity   Alcohol use: Not Currently    Comment: occasionally   Drug use: No   Sexual activity: Not on file  Other Topics Concern   Not on file  Social History Narrative   Married   Occupation: Advertising account executive currently unemployed   Married   Regular exercise-no   HH of 2   Pet cat      Social Determinants of Health   Financial Resource Strain: Low Risk  (03/04/2023)   Overall Financial Resource Strain (CARDIA)  Difficulty of Paying Living Expenses: Not very hard  Food Insecurity: No Food Insecurity (03/04/2023)   Hunger Vital Sign    Worried About Running Out of Food in the Last Year: Never true    Ran Out of Food in the Last Year: Never true  Transportation Needs: No Transportation Needs (03/04/2023)   PRAPARE - Administrator, Civil Service (Medical): No    Lack of Transportation (Non-Medical): No  Physical Activity: Sufficiently Active (03/04/2023)   Exercise Vital Sign    Days of Exercise per Week: 3 days    Minutes of Exercise per Session: 60 min  Stress: No Stress Concern Present (03/04/2023)   [employer] of Occupational Health - Occupational Stress Questionnaire    Feeling of Stress : Only a little  Social Connections: Unknown (03/04/2023)   Social Connection and Isolation Panel [NHANES]    Frequency of Communication with Friends and Family: More than three times a week    Frequency of Social  Gatherings with Friends and Family: More than three times a week    Attends Religious Services: Patient declined    Database administrator or Organizations: Yes    Attends Engineer, structural: More than 4 times per year    Marital Status: Married   Family History  Problem Relation Age of Onset   Arthritis Mother    Other Mother        neurological disorder   Pancreatic cancer Mother    Heart disease Father    Thyroid nodules Sister    Heart disease Brother        x2 1 brother deceased   Colon cancer Neg Hx    Colon polyps Neg Hx    Esophageal cancer Neg Hx    Rectal cancer Neg Hx    Stomach cancer Neg Hx    No Known Allergies Current Outpatient Medications  Medication Sig Dispense Refill   traMADol (ULTRAM) 50 MG tablet Take 1 tablet (50 mg total) by mouth every 6 (six) hours as needed for up to 5 days. 20 tablet 0   acetaminophen (TYLENOL) 650 MG CR tablet Take 1,300 mg by mouth every 8 (eight) hours as needed for pain.     albuterol (VENTOLIN HFA) 108 (90 Base) MCG/ACT inhaler Inhale 2 puffs into the lungs every 4 (four) hours as needed for wheezing or shortness of breath (coughing fits). 18 g 1   apixaban (ELIQUIS) 5 MG TABS tablet Take 1 tablet (5 mg total) by mouth 2 (two) times daily. 180 tablet 3   apixaban (ELIQUIS) 5 MG TABS tablet Take 1 tablet (5 mg total) by mouth 2 (two) times daily.     benralizumab (FASENRA) 10 MG/0.5ML prefilled syringe Inject as directed. Every 4 wks.     Budeson-Glycopyrrol-Formoterol (BREZTRI AEROSPHERE) 160-9-4.8 MCG/ACT AERO Inhale 2 puffs into the lungs in the morning and at bedtime. 10.7 g 5   Cholecalciferol 50 MCG (2000 UT) CAPS Take 1 capsule by mouth daily. PATIENT USING OIL AND NOT CAPSULES     diclofenac Sodium (VOLTAREN) 1 % GEL Apply 4 g topically 4 (four) times daily. 100 g 0   diltiazem (CARDIZEM CD) 300 MG 24 hr capsule TAKE 1 CAPSULE BY MOUTH EVERY DAY 90 capsule 3   fexofenadine (ALLEGRA) 180 MG tablet TAKE 1 TABLET BY  MOUTH EVERY DAY 30 tablet 5   flecainide (TAMBOCOR) 50 MG tablet TAKE 1 TABLET BY MOUTH TWICE A DAY 180 tablet 2   hydrochlorothiazide (HYDRODIURIL)  25 MG tablet Take 1 tablet (25 mg total) by mouth daily. 90 tablet 1   ipratropium-albuterol (DUONEB) 0.5-2.5 (3) MG/3ML SOLN Take 3 mLs by nebulization every 6 (six) hours as needed. 360 mL 0   levothyroxine (SYNTHROID) 100 MCG tablet TAKE 1 TABLET BY MOUTH EVERY MORNING ON AN EMPTY STOMACH Orally Once a day 90 days 90 tablet 4   losartan (COZAAR) 100 MG tablet TAKE 1 TABLET BY MOUTH EVERY DAY 90 tablet 1   Lutein 10 MG TABS Take 10 mg by mouth daily.     MAGNESIUM CITRATE PO Take 250 mg by mouth.     montelukast (SINGULAIR) 10 MG tablet TAKE 1 TABLET BY MOUTH EVERYDAY AT BEDTIME 90 tablet 1   MULTIPLE VITAMIN PO Take 1 tablet by mouth daily.     Omega-3 Fatty Acids (FISH OIL) 1000 MG CAPS      pantoprazole (PROTONIX) 40 MG tablet TAKE 1 TABLET BY MOUTH EVERY DAY 90 tablet 1   potassium chloride (KLOR-CON M10) 10 MEQ tablet Take 1 tablet (10 mEq total) by mouth 3 (three) times daily. 270 tablet 0   rosuvastatin (CRESTOR) 10 MG tablet TAKE 1 TABLET BY MOUTH EVERY DAY 90 tablet 1   Spacer/Aero-Holding Chambers DEVI Use with spacer 1 each 2   valACYclovir (VALTREX) 500 MG tablet As needed     No current facility-administered medications for this visit.   No results found.  Review of Systems:   A ROS was performed including pertinent positives and negatives as documented in the HPI.  Physical Exam :   Constitutional: NAD and appears stated age Neurological: Alert and oriented Psych: Appropriate affect and cooperative There were no vitals taken for this visit.   Comprehensive Musculoskeletal Exam:    Left knee exam reveals significant erythema and edema beginning at the knee extending to the left ankle.  There is focal ecchymosis and tenderness noted over the patella.  Active knee ROM limited from 0 to 60 degrees.  Some tightness and  tenderness noted in the calf.  No evidence of skin compromise.  Imaging:   Xray (left knee 4 views): No evidence of acute fracture or dislocation.  Moderate osteoarthritis of the medial compartment with joint space narrowing and peripheral osteophytes.  Patellofemoral osteophytes   I personally reviewed and interpreted the radiographs.   Assessment:   72 y.o. female with a direct impact injury to the left knee 6 days ago.  She is on Eliquis and therefore does have a significant amount of bruising and swelling in the knee and lower leg.  Given the appearance I did repeat an x-ray today which again showed no to evidence of fracture.  She does have some pre-existing medial osteoarthritis which could be aggravated and contributing to her pain.  Discussed that I would not like to opt for cortisone injection at this time given the bruising in the area.  Will continue to monitor her symptoms and appearance of the knee which I hope to begin slowly improving.  Return precautions given on worsening pain, redness, swelling, or fever.  Will give her some tramadol to help with severe pain.  Plan :    -Begin tramadol 50 mg as needed for moderate to severe pain -Return to clinic as needed     I personally saw and evaluated the patient, and participated in the management and treatment plan.  Hazle Nordmann, PA-C Orthopedics

## 2023-03-06 ENCOUNTER — Telehealth: Payer: Self-pay | Admitting: Allergy

## 2023-03-06 ENCOUNTER — Ambulatory Visit: Payer: Medicare HMO | Admitting: Physician Assistant

## 2023-03-06 ENCOUNTER — Other Ambulatory Visit: Payer: Self-pay | Admitting: *Deleted

## 2023-03-06 MED ORDER — BREZTRI AEROSPHERE 160-9-4.8 MCG/ACT IN AERO: 2.0000 | INHALATION_SPRAY | Freq: Two times a day (BID) | RESPIRATORY_TRACT | 11 refills | Status: DC

## 2023-03-06 NOTE — Telephone Encounter (Signed)
Patient request a call back about breztri

## 2023-03-06 NOTE — Telephone Encounter (Signed)
Doy just needed a refill on her Breztri. Rx sent.

## 2023-03-11 ENCOUNTER — Emergency Department (HOSPITAL_BASED_OUTPATIENT_CLINIC_OR_DEPARTMENT_OTHER)
Admission: EM | Admit: 2023-03-11 | Discharge: 2023-03-11 | Disposition: A | Discharge status: Home / Self Care | Payer: Medicare HMO | Attending: Emergency Medicine | Admitting: Emergency Medicine

## 2023-03-11 ENCOUNTER — Other Ambulatory Visit: Payer: Self-pay

## 2023-03-11 ENCOUNTER — Emergency Department (HOSPITAL_BASED_OUTPATIENT_CLINIC_OR_DEPARTMENT_OTHER): Payer: Medicare HMO | Admitting: Radiology

## 2023-03-11 ENCOUNTER — Encounter (HOSPITAL_BASED_OUTPATIENT_CLINIC_OR_DEPARTMENT_OTHER): Payer: Self-pay | Admitting: Emergency Medicine

## 2023-03-11 DIAGNOSIS — J45909 Unspecified asthma, uncomplicated: Secondary | ICD-10-CM | POA: Diagnosis not present

## 2023-03-11 DIAGNOSIS — R600 Localized edema: Secondary | ICD-10-CM

## 2023-03-11 DIAGNOSIS — Z7901 Long term (current) use of anticoagulants: Secondary | ICD-10-CM | POA: Diagnosis not present

## 2023-03-11 DIAGNOSIS — J40 Bronchitis, not specified as acute or chronic: Secondary | ICD-10-CM | POA: Insufficient documentation

## 2023-03-11 DIAGNOSIS — R0602 Shortness of breath: Secondary | ICD-10-CM | POA: Diagnosis not present

## 2023-03-11 DIAGNOSIS — R2243 Localized swelling, mass and lump, lower limb, bilateral: Secondary | ICD-10-CM | POA: Insufficient documentation

## 2023-03-11 DIAGNOSIS — Z1152 Encounter for screening for COVID-19: Secondary | ICD-10-CM | POA: Insufficient documentation

## 2023-03-11 LAB — COMPREHENSIVE METABOLIC PANEL
ALT: 13 U/L (ref 0–44)
AST: 19 U/L (ref 15–41)
Albumin: 3.4 g/dL — ABNORMAL LOW (ref 3.5–5.0)
Alkaline Phosphatase: 80 U/L (ref 38–126)
Anion gap: 6 (ref 5–15)
BUN: 21 mg/dL (ref 8–23)
CO2: 36 mmol/L — ABNORMAL HIGH (ref 22–32)
Calcium: 9.5 mg/dL (ref 8.9–10.3)
Chloride: 98 mmol/L (ref 98–111)
Creatinine, Ser: 0.7 mg/dL (ref 0.44–1.00)
GFR, Estimated: >60 mL/min (ref >60)
Glucose, Bld: 106 mg/dL — ABNORMAL HIGH (ref 70–99)
Potassium: 4.2 mmol/L (ref 3.5–5.1)
Sodium: 140 mmol/L (ref 135–145)
Total Bilirubin: 0.5 mg/dL (ref <1.2)
Total Protein: 6.7 g/dL (ref 6.5–8.1)

## 2023-03-11 LAB — CBC
HCT: 36.5 % (ref 36.0–46.0)
Hemoglobin: 11.7 g/dL — ABNORMAL LOW (ref 12.0–15.0)
MCH: 28.3 pg (ref 26.0–34.0)
MCHC: 32.1 g/dL (ref 30.0–36.0)
MCV: 88.2 fL (ref 80.0–100.0)
Platelets: 291 10*3/uL (ref 150–400)
RBC: 4.14 MIL/uL (ref 3.87–5.11)
RDW: 14.4 % (ref 11.5–15.5)
WBC: 8.3 10*3/uL (ref 4.0–10.5)
nRBC: 0 % (ref 0.0–0.2)

## 2023-03-11 LAB — RESP PANEL BY RT-PCR (RSV, FLU A&B, COVID)  RVPGX2
Influenza A by PCR: NEGATIVE
Influenza B by PCR: NEGATIVE
Resp Syncytial Virus by PCR: NEGATIVE
SARS Coronavirus 2 by RT PCR: NEGATIVE

## 2023-03-11 LAB — BRAIN NATRIURETIC PEPTIDE: B Natriuretic Peptide: 227.2 pg/mL — ABNORMAL HIGH (ref 0.0–100.0)

## 2023-03-11 MED ORDER — DOXYCYCLINE HYCLATE 100 MG PO CAPS: 100.0000 mg | ORAL_CAPSULE | Freq: Two times a day (BID) | ORAL | 0 refills | Status: AC

## 2023-03-11 MED ORDER — FUROSEMIDE 20 MG PO TABS: 20.0000 mg | ORAL_TABLET | Freq: Every day | ORAL | 0 refills | Status: DC

## 2023-03-11 MED ORDER — PREDNISONE 20 MG PO TABS: 40.0000 mg | ORAL_TABLET | Freq: Every day | ORAL | 0 refills | Status: DC

## 2023-03-11 MED ORDER — ALBUTEROL SULFATE HFA 108 (90 BASE) MCG/ACT IN AERS
2.0000 | INHALATION_SPRAY | RESPIRATORY_TRACT | Status: DC | PRN
  Administered 2023-03-11: 2 via RESPIRATORY_TRACT
  Filled 2023-03-11: qty 6.7

## 2023-03-11 MED ORDER — IPRATROPIUM-ALBUTEROL 0.5-2.5 (3) MG/3ML IN SOLN
3.0000 mL | Freq: Once | RESPIRATORY_TRACT | Status: AC
  Administered 2023-03-11: 3 mL via RESPIRATORY_TRACT
  Filled 2023-03-11: qty 3

## 2023-03-11 MED ORDER — PREDNISONE 50 MG PO TABS
60.0000 mg | ORAL_TABLET | Freq: Every day | ORAL | Status: DC
  Administered 2023-03-11: 60 mg via ORAL
  Filled 2023-03-11: qty 1

## 2023-03-11 MED ORDER — FUROSEMIDE 10 MG/ML IJ SOLN: 40.0000 mg | Freq: Once | INTRAMUSCULAR | Status: DC

## 2023-03-11 MED ORDER — DOXYCYCLINE HYCLATE 100 MG PO TABS
100.0000 mg | ORAL_TABLET | Freq: Once | ORAL | Status: AC
Start: 2023-03-11 — End: 2023-03-11
  Administered 2023-03-11: 100 mg via ORAL
  Filled 2023-03-11: qty 1

## 2023-03-11 NOTE — ED Provider Notes (Signed)
[place] EMERGENCY DEPARTMENT AT [facility] Provider Note   CSN: [id number] Arrival date & time: 03/11/23  1451     History {Add pertinent medical, surgical, social history, OB history to HPI:1} Chief Complaint  Patient presents with   Shortness of Breath    Amy Stokes is a 72 y.o. female.  72 year old female with a history of atrial fibrillation on Eliquis and asthma who presents to the emergency department with shortness of breath.  On Thanksgiving was around other family members who are having a URI.  Since then has had a productive cough and shortness of breath.  Also has bilateral lower extremity swelling.  Not currently on a diuretic.  Had a fall around and injured her left knee and has been having some swelling since then of her left leg.  Had x-rays that did not show fracture.  Does report that her right leg is swelling as well now too.  Has been compliant with her Eliquis.  Has been having wheezing at home and has been taking her albuterol with some improvement of her symptoms.  Physical shortness of breath got worse so she decided to come in.  Also been having palpitations and her watch telling her that she is in A-fib.  No chest pain.  No fevers.       Home Medications Prior to Admission medications   Medication Sig Start Date End Date Taking? Authorizing Provider  acetaminophen (TYLENOL) 650 MG CR tablet Take 1,300 mg by mouth every 8 (eight) hours as needed for pain.    [provider]  albuterol (VENTOLIN HFA) 108 (90 Base) MCG/ACT inhaler Inhale 2 puffs into the lungs every 4 (four) hours as needed for wheezing or shortness of breath (coughing fits). 08/13/22   Ellamae Sia, DO  apixaban (ELIQUIS) 5 MG TABS tablet Take 1 tablet (5 mg total) by mouth 2 (two) times daily. 11/26/22   Jodelle Red, MD  apixaban (ELIQUIS) 5 MG TABS tablet Take 1 tablet (5 mg total) by mouth 2 (two) times daily. 12/05/22   Jodelle Red, MD   benralizumab [facility]) 10 MG/0.5ML prefilled syringe Inject as directed. Every 4 wks. 10/03/22   [provider]  Budeson-Glycopyrrol-Formoterol (BREZTRI AEROSPHERE) 160-9-4.8 MCG/ACT AERO Inhale 2 puffs into the lungs in the morning and at bedtime. 03/06/23   Ellamae Sia, DO  Cholecalciferol 50 MCG (2000 UT) CAPS Take 1 capsule by mouth daily. PATIENT USING OIL AND NOT CAPSULES    [provider]  diclofenac Sodium (VOLTAREN) 1 % GEL Apply 4 g topically 4 (four) times daily. 02/26/23   Melene Plan, DO  diltiazem (CARDIZEM CD) 300 MG 24 hr capsule TAKE 1 CAPSULE BY MOUTH EVERY DAY 11/05/22   Jodelle Red, MD  fexofenadine (ALLEGRA) 180 MG tablet TAKE 1 TABLET BY MOUTH EVERY DAY 06/16/22   Cobb, Ruby Cola, NP  flecainide (TAMBOCOR) 50 MG tablet TAKE 1 TABLET BY MOUTH TWICE A DAY 02/02/23   Jodelle Red, MD  hydrochlorothiazide (HYDRODIURIL) 25 MG tablet Take 1 tablet (25 mg total) by mouth daily. 02/04/23   Panosh, Neta Mends, MD  ipratropium-albuterol (DUONEB) 0.5-2.5 (3) MG/3ML SOLN Take 3 mLs by nebulization every 6 (six) hours as needed. 02/28/21   Rolly Salter, MD  levothyroxine (SYNTHROID) 100 MCG tablet TAKE 1 TABLET BY MOUTH EVERY MORNING ON AN EMPTY STOMACH Orally Once a day 90 days 04/16/21     losartan (COZAAR) 100 MG tablet TAKE 1 TABLET BY MOUTH EVERY DAY 01/30/23  Worthy Rancher B, FNP  Lutein 10 MG TABS Take 10 mg by mouth daily.    [provider]  MAGNESIUM CITRATE PO Take 250 mg by mouth.    [provider]  montelukast (SINGULAIR) 10 MG tablet TAKE 1 TABLET BY MOUTH EVERYDAY AT BEDTIME 10/20/22   Ellamae Sia, DO  MULTIPLE VITAMIN PO Take 1 tablet by mouth daily.    [provider]  Omega-3 Fatty Acids (FISH OIL) 1000 MG CAPS  11/03/22   [provider]  pantoprazole (PROTONIX) 40 MG tablet TAKE 1 TABLET BY MOUTH EVERY DAY 11/27/22   Olalere, Adewale A, MD  potassium chloride (KLOR-CON M10) 10 MEQ tablet Take 1 tablet (10  mEq total) by mouth 3 (three) times daily. 02/04/23   Panosh, Neta Mends, MD  rosuvastatin (CRESTOR) 10 MG tablet TAKE 1 TABLET BY MOUTH EVERY DAY 01/21/23   Alver Sorrow, NP  Spacer/Aero-Holding [facility] Use with spacer 06/04/21   Cobb, Ruby Cola, NP  valACYclovir (VALTREX) 500 MG tablet As needed 04/08/21   [provider]      Allergies    Patient has no known allergies.    Review of Systems   Review of Systems  Physical Exam Updated Vital Signs BP (!) 153/55 (BP Location: Right Arm)   Pulse 63   Temp 98 F (36.7 C)   Resp 18   Ht 5\' 7"  (1.702 m)   Wt 129.3 kg   SpO2 96%   BMI 44.65 kg/m  Physical Exam Vitals and nursing note reviewed.  Constitutional:      General: She is not in acute distress.    Appearance: She is well-developed.  HENT:     Head: Normocephalic and atraumatic.     Right Ear: External ear normal.     Left Ear: External ear normal.     Nose: Nose normal.  Eyes:     Extraocular Movements: Extraocular movements intact.     Conjunctiva/sclera: Conjunctivae normal.     Pupils: Pupils are equal, round, and reactive to light.  Cardiovascular:     Rate and Rhythm: Normal rate. Rhythm irregular.     Heart sounds: No murmur heard. Pulmonary:     Effort: Pulmonary effort is normal. No respiratory distress.     Breath sounds: Wheezing (Diffuse mild expiratory) present.  Musculoskeletal:     Cervical back: Normal range of motion and neck supple.     Right lower leg: Edema present.     Left lower leg: Edema present.  Skin:    General: Skin is warm and dry.  Neurological:     Mental Status: She is alert and oriented to person, place, and time. Mental status is at baseline.  Psychiatric:        Mood and Affect: Mood normal.    Legs   ED Results / Procedures / Treatments   Labs (all labs ordered are listed, but only abnormal results are displayed) Labs Reviewed - No data to display  EKG EKG Interpretation Date/Time:  Wednesday  March 11 2023 15:00:04 EST Ventricular Rate:  70 PR Interval:    QRS Duration:  84 QT Interval:  420 QTC Calculation: 453 R Axis:   77  Text Interpretation: Atrial fibrillation Abnormal ECG Interpretation limited secondary to artifact Confirmed by Vonita Moss [phone]) on 03/11/2023 3:25:01 PM  Radiology No results found.  Procedures Procedures  {Document cardiac monitor, telemetry assessment procedure when appropriate:1}  Medications Ordered in ED Medications  albuterol (VENTOLIN HFA)  108 (90 Base) MCG/ACT inhaler 2 puff (has no administration in time range)    ED Course/ Medical Decision Making/ A&P   {   Click here for ABCD2, HEART and other calculatorsREFRESH Note before signing :1}                              Medical Decision Making Amount and/or Complexity of Data Reviewed Labs: ordered. Radiology: ordered.  Risk Prescription drug management.   ***  {Document critical care time when appropriate:1} {Document review of labs and clinical decision tools ie heart score, Chads2Vasc2 etc:1}  {Document your independent review of radiology images, and any outside records:1} {Document your discussion with family members, caretakers, and with consultants:1} {Document social determinants of health affecting pt's care:1} {Document your decision making why or why not admission, treatments were needed:1} Final Clinical Impression(s) / ED Diagnoses Final diagnoses:  None    Rx / DC Orders ED Discharge Orders     None

## 2023-03-11 NOTE — ED Triage Notes (Signed)
Pt via pov from home with sob x 2 days. Pt has hx of afib. Pt's legs are swollen as well. Pt takes hydrochlorothiazide daily. Pt states her left leg has been swollen since she fell on Thanksgiving; right leg swelled some, but left is worse. Pt denies cp, n/v. Pt alert & oriented, nad noted.

## 2023-03-11 NOTE — ED Notes (Signed)
Rt gave the Pt a spacer

## 2023-03-11 NOTE — ED Notes (Signed)
RT assess the Pt and her lungs sound clear.The Pt's legs are swollen and she has AFIB

## 2023-03-11 NOTE — Discharge Instructions (Signed)
You were seen for your bronchitis and lower extremity swelling in the emergency department.   At home, please take the Lasix for your lower extremity swelling.  Take the prednisone and doxycycline for your bronchitis.  Please also use your albuterol inhaler for wheezing.    Check your MyChart online for the results of any tests that had not resulted by the time you left the emergency department.   Follow-up with your primary doctor in 2-3 days regarding your visit.    Return immediately to the emergency department if you experience any of the following: Difficulty breathing, or any other concerning symptoms.    Thank you for visiting our Emergency Department. It was a pleasure taking care of you today.

## 2023-03-12 ENCOUNTER — Encounter (HOSPITAL_BASED_OUTPATIENT_CLINIC_OR_DEPARTMENT_OTHER): Payer: Self-pay | Admitting: Cardiology

## 2023-03-12 ENCOUNTER — Telehealth: Payer: Self-pay

## 2023-03-12 ENCOUNTER — Ambulatory Visit (HOSPITAL_BASED_OUTPATIENT_CLINIC_OR_DEPARTMENT_OTHER): Payer: Medicare HMO | Admitting: Cardiology

## 2023-03-12 VITALS — BP 142/68 | HR 85 | Ht 67.0 in | Wt 303.9 lb

## 2023-03-12 DIAGNOSIS — G4733 Obstructive sleep apnea (adult) (pediatric): Secondary | ICD-10-CM

## 2023-03-12 DIAGNOSIS — Z79899 Other long term (current) drug therapy: Secondary | ICD-10-CM

## 2023-03-12 DIAGNOSIS — I480 Paroxysmal atrial fibrillation: Secondary | ICD-10-CM

## 2023-03-12 DIAGNOSIS — I10 Essential (primary) hypertension: Secondary | ICD-10-CM | POA: Diagnosis not present

## 2023-03-12 DIAGNOSIS — D6869 Other thrombophilia: Secondary | ICD-10-CM

## 2023-03-12 DIAGNOSIS — E782 Mixed hyperlipidemia: Secondary | ICD-10-CM

## 2023-03-12 DIAGNOSIS — Z7901 Long term (current) use of anticoagulants: Secondary | ICD-10-CM | POA: Diagnosis not present

## 2023-03-12 NOTE — Progress Notes (Signed)
Cardiology Office Note:  .   Date:  03/12/2023  ID:  Amy Stokes, DOB [birth year], MRN [id number] PCP: Amy Headings, MD  [facility] Providers Cardiologist:  Jodelle Red, MD Electrophysiologist:  Regan Lemming, MD {  History of Present Illness: .   Amy Stokes is a 72 y.o. female with a hx of paroxysmal atrial fibrillation, hypertension, asthma, OSA on CPAP, morbid obesity who is seen for follow-up. I initially met her 12/10/2020 as a new consult at the request of Panosh, Neta Mends, MD for the evaluation and management of elevated heart rate, concern for atrial fibrillation.  Pertinent CV history: paroxysmal atrial fibrillation, hypertension, OSA, morbid obesity, former tobacco use. Echo 10/2020 with EF 55-60%, no significant valve disease, normal RV.  Today: Presented to the ER yesterday with shortness of breath and productive cough. Also noted bilateral LE edema and redness. Given furosemide, prednisone and doxycycline, which she has not yet picked up as she was discharged late last night. Labs showed BNP 227. Covid negative. ECG showed atrial fibrillation at 70 bpm. CXR unremarkable.  She notes that on Thanksgiving day she tripped and landed on her left knee, noted left leg swelling since that time, with right leg slightly less swollen. Also notes that family members at thanksgiving had URI, has been wheezing since that time. No significant congestion. No clear fevers/chills but earlier in the week felt like she couldn't get warm.   Reviewed her meds today--discussed not to take both hydrochlorothiazide and lasix on the same day. Has not missed any of her apixaban doses. Feels that she is back in regular rhythm after leaving the ER last night.  ROS: Denies chest pain, shortness of breath at rest or with normal exertion. No PND, orthopnea or unexpected weight gain. No syncope. ROS otherwise negative except as noted.   Studies Reviewed: [name]    EKG:        Physical Exam:   VS:  BP (!) 142/68 (BP Location: Right Arm, Patient Position: Sitting, Cuff Size: Large)   Pulse 85   Ht 5\' 7"  (1.702 m)   Wt (!) 303 lb 14.4 oz (137.8 kg)   SpO2 95%   BMI 47.60 kg/m    Wt Readings from Last 3 Encounters:  03/12/23 (!) 303 lb 14.4 oz (137.8 kg)  03/11/23 285 lb 0.9 oz (129.3 kg)  02/26/23 285 lb (129.3 kg)    GEN: Well nourished, well developed in no acute distress HEENT: Normal, moist mucous membranes NECK: No JVD CARDIAC: regular rhythm, normal S1 and S2, no rubs or gallops. No murmur. VASCULAR: Radial and DP pulses 2+ bilaterally. No carotid bruits RESPIRATORY:  Clear to auscultation without rales, wheezing, has some scattered rhonchi ABDOMEN: Soft, non-tender, non-distended MUSCULOSKELETAL:  Ambulates independently SKIN: Warm and dry, bilateral 2+ piting edema with erythema, left worse than right, no oozing NEUROLOGIC:  Alert and oriented x 3. No focal neuro deficits noted. PSYCHIATRIC:  Normal affect    ASSESSMENT AND PLAN: .    LE edema Elevated BNP -given lasix, discussed using this PRN and not using hydrochlorothiazide on the same day -she has significant swelling. May require more than 4 days of lasix. She will let me know if she needs more -if recurrent, would give PRN dosing and update echo  Paroxysmal atrial fibrillation: -in sinus today by exam. Afib on ECG last night in ER. Suspect this has been exacerbated by her URI. -CHA2DS2/VAS Stroke Risk Points = 3. Continue apixaban -on flecainide, follows  with Dr. Elberta Fortis. High risk medication. -on diltiazem as well   Hypertension: home numbers well controlled, elevated today but hasn't taken her medication yet -continue HCTZ, losartan. Discussed not taking hydrochlorothiazide on days she takes lasix. She will do lasix for 4 days and then return to HCTZ -on diltiazem as well   Hyperlipidemia:  -tolerating rosuvastatin, last LDL 84   OSA: continue CPAP   Morbid obesity: would  be a good candidate for GLP1RA for weight loss if covered by medicare. BMI 47.    CV risk counseling and prevention -recommend heart healthy/Mediterranean diet, with whole grains, fruits, vegetable, fish, lean meats, nuts, and olive oil. Limit salt. -recommend moderate walking, 3-5 times/week for 30-50 minutes each session. Aim for at least 150 minutes.week. Goal should be pace of 3 miles/hours, or walking 1.5 miles in 30 minutes -recommend avoidance of tobacco products. Avoid excess alcohol. -ASCVD risk score: The 10-year ASCVD risk score (Arnett DK, et al., 2019) is: 16.1%   Values used to calculate the score:     Age: 15 years     Sex: Female     Is Non-Hispanic African American: No     Diabetic: No     Tobacco smoker: No     Systolic Blood Pressure: 142 mmHg     Is BP treated: Yes     HDL Cholesterol: 44.8 mg/dL     Total Cholesterol: 142 mg/dL    Dispo: 3 mos or sooner as needed  Level 5 due to high risk medication use  Signed, Jodelle Red, MD   Jodelle Red, MD, PhD, [facility]  [facility]  Seth Ward  Heart & Vascular at [facility] at [facility] [address] [place], [place] [address] [phone]

## 2023-03-12 NOTE — Transitions of Care (Post Inpatient/ED Visit) (Signed)
03/12/2023  Name: Amy Stokes MRN: [id number] DOB: 1950/06/22  Today's TOC FU Call Status: Today's TOC FU Call Status:: Successful TOC FU Call Completed TOC FU Call Complete Date: 03/12/23 Patient's Name and Date of Birth confirmed.  Transition Care Management Follow-up Telephone Call Date of Discharge: 03/11/23 Discharge Facility: [facility] ([facility]) Type of Discharge: Emergency Department Reason for ED Visit:  (bronchitis) How have you been since you were released from the hospital?: Better Any questions or concerns?: No  Items Reviewed: Did you receive and understand the discharge instructions provided?: Yes Medications obtained,verified, and reconciled?: Yes (Medications Reviewed) Any new allergies since your discharge?: No Dietary orders reviewed?: NA Do you have support at home?: Yes People in Home: spouse  Medications Reviewed Today: Medications Reviewed Today     Reviewed by Karena Addison, LPN (Licensed Practical Nurse) on 03/12/23 at 1204  Med List Status: <None>   Medication Order Taking? Sig Documenting Provider Last Dose Status Informant  acetaminophen (TYLENOL) 650 MG CR tablet [id number] No Take 1,300 mg by mouth every 8 (eight) hours as needed for pain. [provider] Taking Active Self  albuterol (VENTOLIN HFA) 108 (90 Base) MCG/ACT inhaler [id number] No Inhale 2 puffs into the lungs every 4 (four) hours as needed for wheezing or shortness of breath (coughing fits). Ellamae Sia, DO Taking Active   apixaban (ELIQUIS) 5 MG TABS tablet [id number] No Take 1 tablet (5 mg total) by mouth 2 (two) times daily. Jodelle Red, MD Taking Active   benralizumab [facility]) 10 MG/0.5ML prefilled syringe [id number] No Inject as directed. Every 4 wks. [provider] Taking Active   Budeson-Glycopyrrol-Formoterol (BREZTRI AEROSPHERE) 160-9-4.8 MCG/ACT AERO [id number] No Inhale 2 puffs into the lungs in the morning and at bedtime. Ellamae Sia,  DO Taking Active   Cholecalciferol 50 MCG (2000 UT) CAPS [id number] No Take 1 capsule by mouth daily. PATIENT USING OIL AND NOT CAPSULES [provider] Taking Active Self  diltiazem (CARDIZEM CD) 300 MG 24 hr capsule [id number] No TAKE 1 CAPSULE BY MOUTH EVERY DAY Jodelle Red, MD Taking Active   doxycycline (VIBRAMYCIN) 100 MG capsule [id number] No Take 1 capsule (100 mg total) by mouth 2 (two) times daily for 5 days. Rondel Baton, MD Taking Active   flecainide [facility]) 50 MG tablet [id number] No TAKE 1 TABLET BY MOUTH TWICE A DAY Jodelle Red, MD Taking Active   furosemide (LASIX) 20 MG tablet [id number] No Take 1 tablet (20 mg total) by mouth daily for 4 days. Rondel Baton, MD Taking Active   hydrochlorothiazide (HYDRODIURIL) 25 MG tablet [id number] No Take 1 tablet (25 mg total) by mouth daily. Panosh, Neta Mends, MD Taking Active   ipratropium-albuterol (DUONEB) 0.5-2.5 (3) MG/3ML SOLN [id number] No Take 3 mLs by nebulization every 6 (six) hours as needed. Rolly Salter, MD Taking Active   levothyroxine (SYNTHROID) 100 MCG tablet [id number] No TAKE 1 TABLET BY MOUTH EVERY MORNING ON AN EMPTY STOMACH Orally Once a day 90 days  Taking Active   losartan (COZAAR) 100 MG tablet [id number] No TAKE 1 TABLET BY MOUTH EVERY DAY Worthy Rancher B, FNP Taking Active   Lutein 10 MG TABS [id number] No Take 10 mg by mouth daily. [provider] Taking Active Self  MAGNESIUM CITRATE PO [id number] No Take 250 mg by mouth. [provider] Taking Active   MULTIPLE VITAMIN PO [id number] No Take 1 tablet by mouth daily. [provider] Taking Active Self  Omega-3 Fatty Acids (FISH OIL)  1000 MG CAPS [id number] No  [provider] Taking Active   pantoprazole (PROTONIX) 40 MG tablet [id number] No TAKE 1 TABLET BY MOUTH EVERY DAY Olalere, Adewale A, MD Taking Active   potassium chloride (KLOR-CON M10) 10 MEQ tablet [id number] No Take 1 tablet (10 mEq total)  by mouth 3 (three) times daily. Panosh, Neta Mends, MD Taking Active   predniSONE (DELTASONE) 20 MG tablet [id number] No Take 2 tablets (40 mg total) by mouth daily. Rondel Baton, MD Taking Active   rosuvastatin (CRESTOR) 10 MG tablet [id number] No TAKE 1 TABLET BY MOUTH EVERY DAY Alver Sorrow, NP Taking Active   Spacer/Aero-Holding Chambers DEVI [id number] No Use with spacer Allison Quarry Ruby Cola, NP Taking Active   valACYclovir (VALTREX) 500 MG tablet [id number] No As needed [provider] Taking Active             Home Care and Equipment/Supplies: Were Home Health Services Ordered?: NA Any new equipment or medical supplies ordered?: NA  Functional Questionnaire: Do you need assistance with bathing/showering or dressing?: No Do you need assistance with meal preparation?: No Do you need assistance with eating?: No Do you have difficulty maintaining continence: No Do you need assistance with getting out of bed/getting out of a chair/moving?: No Do you have difficulty managing or taking your medications?: No  Follow up appointments reviewed: PCP Follow-up appointment confirmed?: No (declined appt) MD Provider Line [phone] Given: No Specialist Hospital Follow-up appointment confirmed?: Yes Date of Specialist follow-up appointment?: 03/12/23 Follow-Up Specialty Provider:: cardio Do you need transportation to your follow-up appointment?: No Do you understand care options if your condition(s) worsen?: Yes-patient verbalized understanding    SIGNATURE Karena Addison, LPN [facility] Nurse Health Advisor Direct Dial [phone]

## 2023-03-12 NOTE — Patient Instructions (Signed)
 Medication Instructions:  No Changes *If you need a refill on your cardiac medications before your next appointment, please call your pharmacy*   Lab Work: No Labs If you have labs (blood work) drawn today and your tests are completely normal, you will receive your results only by: MyChart Message (if you have MyChart) OR A paper copy in the mail If you have any lab test that is abnormal or we need to change your treatment, we will call you to review the results.   Testing/Procedures: No Testing   Follow-Up: At [facility], you and your health needs are our priority.  As part of our continuing mission to provide you with exceptional heart care, we have created designated Provider Care Teams.  These Care Teams include your primary Cardiologist (physician) and Advanced Practice Providers (APPs -  Physician Assistants and Nurse Practitioners) who all work together to provide you with the care you need, when you need it.  We recommend signing up for the patient portal called "MyChart".  Sign up information is provided on this After Visit Summary.  MyChart is used to connect with patients for Virtual Visits (Telemedicine).  Patients are able to view lab/test results, encounter notes, upcoming appointments, etc.  Non-urgent messages can be sent to your provider as well.   To learn more about what you can do with MyChart, go to [email or web address].    Your next appointment:   3 month(s)  Provider:   Jodelle Red, MD

## 2023-03-15 ENCOUNTER — Encounter (HOSPITAL_BASED_OUTPATIENT_CLINIC_OR_DEPARTMENT_OTHER): Payer: Self-pay

## 2023-03-15 DIAGNOSIS — I10 Essential (primary) hypertension: Secondary | ICD-10-CM

## 2023-03-16 MED ORDER — FUROSEMIDE 20 MG PO TABS: 20.0000 mg | ORAL_TABLET | ORAL | 1 refills | Status: DC | PRN

## 2023-03-18 ENCOUNTER — Encounter (HOSPITAL_BASED_OUTPATIENT_CLINIC_OR_DEPARTMENT_OTHER): Payer: Self-pay

## 2023-03-18 ENCOUNTER — Other Ambulatory Visit: Payer: Self-pay

## 2023-03-18 ENCOUNTER — Encounter: Payer: Self-pay | Admitting: Allergy

## 2023-03-18 ENCOUNTER — Ambulatory Visit: Payer: Medicare HMO | Admitting: Allergy

## 2023-03-18 VITALS — BP 126/60 | HR 72 | Temp 98.5°F | Resp 20 | Ht 67.0 in | Wt 302.6 lb

## 2023-03-18 DIAGNOSIS — J3089 Other allergic rhinitis: Secondary | ICD-10-CM

## 2023-03-18 DIAGNOSIS — Z91038 Other insect allergy status: Secondary | ICD-10-CM | POA: Diagnosis not present

## 2023-03-18 DIAGNOSIS — J4550 Severe persistent asthma, uncomplicated: Secondary | ICD-10-CM | POA: Diagnosis not present

## 2023-03-18 DIAGNOSIS — K219 Gastro-esophageal reflux disease without esophagitis: Secondary | ICD-10-CM

## 2023-03-18 NOTE — Patient Instructions (Addendum)
Asthma: Daily controller medication(s): Breztri 2 puffs twice a day with spacer and rinse mouth afterwards.  Samples given.  Give Fasenra injection on 12/27th and then will switch to Tezspire injection - every 4 weeks. Handout given.  Tammy will be in touch with you regarding coverage.  Monitor wheezing episodes. May use albuterol rescue inhaler 2 puffs or nebulizer every 4 to 6 hours as needed for shortness of breath, chest tightness, coughing, and wheezing. May use albuterol rescue inhaler 2 puffs 5 to 15 minutes prior to strenuous physical activities. Monitor frequency of use - if you need to use it more than twice per week on a consistent basis let us know.  Asthma control goals:  Full participation in all desired activities (may need albuterol before activity) Albuterol use two times or less a week on average (not counting use with activity) Cough interfering with sleep two times or less a month Oral steroids no more than once a year No hospitalizations   Environmental allergies 2023 skin testing positive to mold, cockroach, dust mites.  Continue environmental control measures.  Use over the counter antihistamines such as Zyrtec (cetirizine), Claritin (loratadine), Allegra (fexofenadine), or Xyzal (levocetirizine) daily as needed. May take twice a day during allergy flares. May switch antihistamines every few months. Use Flonase (fluticasone) nasal spray 1-2 sprays per nostril once a day as needed for nasal congestion.  Nasal saline spray (i.e., Simply Saline) or nasal saline lavage (i.e., NeilMed) is recommended as needed and prior to medicated nasal sprays.  Reflux Continue Protonix 40mg  daily as before.   Follow up in 4 months or sooner if needed.   Follow up with cardiology, pulmonology and PCP.   Control of House Dust Mite Allergen Dust mite allergens are a common trigger of allergy and asthma symptoms. While they can be found throughout the house, these microscopic creatures  thrive in warm, humid environments such as bedding, upholstered furniture and carpeting. Because so much time is spent in the bedroom, it is essential to reduce mite levels there.  Encase pillows, mattresses, and box springs in special allergen-proof fabric covers or airtight, zippered plastic covers.  Bedding should be washed weekly in hot water (130 F) and dried in a hot dryer. Allergen-proof covers are available for comforters and pillows that can't be regularly washed.  Wash the allergy-proof covers every few months. Minimize clutter in the bedroom. Keep pets out of the bedroom.  Keep humidity less than 50% by using a dehumidifier or air conditioning. You can buy a humidity measuring device called a hygrometer to monitor this.  If possible, replace carpets with hardwood, linoleum, or washable area rugs. If that's not possible, vacuum frequently with a vacuum that has a HEPA filter. Remove all upholstered furniture and non-washable window drapes from the bedroom. Remove all non-washable stuffed toys from the bedroom.  Wash stuffed toys weekly. Mold Control Mold and fungi can grow on a variety of surfaces provided certain temperature and moisture conditions exist.  Outdoor molds grow on plants, decaying vegetation and soil. The major outdoor mold, Alternaria and Cladosporium, are found in very high numbers during hot and dry conditions. Generally, a late summer - fall peak is seen for common outdoor fungal spores. Rain will temporarily lower outdoor mold spore count, but counts rise rapidly when the rainy period ends. The most important indoor molds are Aspergillus and Penicillium. Dark, humid and poorly ventilated basements are ideal sites for mold growth. The next most common sites of mold growth are the bathroom  and the kitchen. Outdoor (Seasonal) Mold Control Use air conditioning and keep windows closed. Avoid exposure to decaying vegetation. Avoid leaf raking. Avoid grain handling. Consider  wearing a face mask if working in moldy areas.  Indoor (Perennial) Mold Control  Maintain humidity below 50%. Get rid of mold growth on hard surfaces with water, detergent and, if necessary, 5% bleach (do not mix with other cleaners). Then dry the area completely. If mold covers an area more than 10 square feet, consider hiring an indoor environmental professional. For clothing, washing with soap and water is best. If moldy items cannot be cleaned and dried, throw them away. Remove sources e.g. contaminated carpets. Repair and seal leaking roofs or pipes. Using dehumidifiers in damp basements may be helpful, but empty the water and clean units regularly to prevent mildew from forming. All rooms, especially basements, bathrooms and kitchens, require ventilation and cleaning to deter mold and mildew growth. Avoid carpeting on concrete or damp floors, and storing items in damp areas. Cockroach Allergen Avoidance Cockroaches are often found in the homes of densely populated urban areas, schools or commercial buildings, but these creatures can lurk almost anywhere. This does not mean that you have a dirty house or living area. Block all areas where roaches can enter the home. This includes crevices, wall cracks and windows.  Cockroaches need water to survive, so fix and seal all leaky faucets and pipes. Have an exterminator go through the house when your family and pets are gone to eliminate any remaining roaches. Keep food in lidded containers and put pet food dishes away after your pets are done eating. Vacuum and sweep the floor after meals, and take out garbage and recyclables. Use lidded garbage containers in the kitchen. Wash dishes immediately after use and clean under stoves, refrigerators or toasters where crumbs can accumulate. Wipe off the stove and other kitchen surfaces and cupboards regularly.

## 2023-03-18 NOTE — Progress Notes (Signed)
Follow Up Note  RE: Amy Stokes MRN: [id number] DOB: 01-05-1951 Date of Office Visit: 03/18/2023  Referring provider: Madelin Headings, MD Primary care provider: Madelin Headings, MD  Chief Complaint: Breathing Problem, Asthma, and Wheezing  History of Present Illness: I had the pleasure of seeing Amy Stokes for a follow up visit at the [facility] on 03/18/2023. She is a 72 y.o. female, who is being followed for asthma on Fasenra, allergic rhinitis and GERD. Her previous allergy office visit was on 11/17/2022 with Dr. Selena Batten. Today is a regular follow up visit.  Discussed the use of AI scribe software for clinical note transcription with the patient, who gave verbal consent to proceed.  The patient presented with a recent fall on Thanksgiving that resulted in significant leg swelling. Around the same time, she was exposed to sick nephews, which she believes led to a bout of bronchitis. She sought emergency care due to shortness of breath, but did not experience typical bronchitis symptoms such as chest congestion or significant coughing. She was treated with doxycycline and prednisone, which she completed and reported feeling better.  The patient has been monitoring her oxygen saturation at home, noting low levels during sleep, with a reading of 84% on one occasion. She has been using a CPAP machine and sleeping in a recliner. She also reported that her oxygen saturation drops to around 87-88% after physical activity, such as going to the bathroom at night, but recovers to 90-91% after a few moments.  She has been using a Breztri 2 puffs twice daily and Fasenra injections at home every eight weeks for her asthma. However, she reported daily wheezing and use of a rescue inhaler about twice a week, depending on the severity of the wheezing. She also reported stopping the use of Singulair and Allegra, with no noticeable difference in symptoms.  The patient also reported  fluid retention, particularly in the legs, which she is managing with Lasix. She has stopped taking hydrochlorothiazide while on Lasix, but plans to switch back if advised by her cardiologist.     03/11/2023 ER visit: "Patient presents to the emergency department with shortness of breath and cough.  On exam does have some mild wheezing.  Does also have lower extremity edema with some erythema noted.  Considered DVT/PE but patient is already anticoagulated weakness highly unlikely.  The left leg is swollen due to her trauma specially since she said it started right after that.  Chest x-ray without acute abnormality.  Labs show mildly elevated BNP with normal renal function.  COVID and flu are negative.  Patient given a DuoNeb as well as prednisone and doxycycline for suspected bronchitis.  Also appears to have some mild cellulitis of her lower extremities which I suspect is from venous stasis but her doxycycline will also cover her for cellulitis as well.  With her leg swelling was given several days of Lasix.  Will have her follow-up with her primary doctor in several days."  Assessment and Plan: Amy Stokes is a 72 y.o. female with: Not well controlled severe persistent asthma Past history - Diagnosed with asthma for 30 years.  Main triggers include dust.  Follows with pulmonology.  2023 blood work showed elevated eosinophils of 300 and IgE of 1409. 2023 spirometry showed severe restriction with no improvement FEV1 postbronchodilator treatment.  Clinically unchanged. Started [facility]enger in June 2024 Interim history - Daily wheezing, recent bronchitis treated with doxycycline and prednisone. Unclear benefit from Fasenra injections.  Today's spirometry showed severe obstruction with no improvement in FEV1 post bronchodilator treatment. Clinically feeling unchanged.  Daily controller medication(s): Breztri 2 puffs twice a day with spacer and rinse mouth afterwards.  Samples given as patient is waiting on her mail  order shipment.  Give Fasenra injection on 12/27th and then will switch to Tezspire injection - every 4 weeks. Handout given.  Amy Stokes will be in touch with you regarding coverage.  Monitor wheezing episodes. May use albuterol rescue inhaler 2 puffs or nebulizer every 4 to 6 hours as needed for shortness of breath, chest tightness, coughing, and wheezing. May use albuterol rescue inhaler 2 puffs 5 to 15 minutes prior to strenuous physical activities. Monitor frequency of use - if you need to use it more than twice per week on a consistent basis let us know.   Get spirometry at next visit. Follow up with cardiology, pulmonology and PCP as not all of her shortness of breath and DOE symptoms seem to be from her asthma but most likely has a cardiac and deconditioning component.  Allergic rhinitis due to dust mite Allergic rhinitis due to mold Allergy to cockroaches Past history - 2023 skin testing positive to mold, cockroach, dust mites.  Interim history - stopped Singulair and allegra with no worsening symptoms. Continue environmental control measures.  Use over the counter antihistamines such as Zyrtec (cetirizine), Claritin (loratadine), Allegra (fexofenadine), or Xyzal (levocetirizine) daily as needed. May take twice a day during allergy flares. May switch antihistamines every few months. Use Flonase (fluticasone) nasal spray 1-2 sprays per nostril once a day as needed for nasal congestion.  Nasal saline spray (i.e., Simply Saline) or nasal saline lavage (i.e., NeilMed) is recommended as needed and prior to medicated nasal sprays.  Gastroesophageal reflux disease, unspecified whether esophagitis present Continue Protonix daily as before.   Return in about 4 months (around 07/17/2023).  No orders of the defined types were placed in this encounter.  Lab Orders  No laboratory test(s) ordered today    Diagnostics: Spirometry:  Tracings reviewed. Her effort: Good reproducible efforts. FVC:  1.63L FEV1: 1.14L, 48% predicted FEV1/FVC ratio: 70% Interpretation: Spirometry consistent with severe obstructive disease with no improvement in FEV1 post bronchodilator treatment. Clinically feeling unchanged.   Please see scanned spirometry results for details.  Results discussed with patient/family.  Medication List:  Current Outpatient Medications  Medication Sig Dispense Refill   acetaminophen (TYLENOL) 650 MG CR tablet Take 1,300 mg by mouth every 8 (eight) hours as needed for pain.     albuterol (VENTOLIN HFA) 108 (90 Base) MCG/ACT inhaler Inhale 2 puffs into the lungs every 4 (four) hours as needed for wheezing or shortness of breath (coughing fits). 18 g 1   apixaban (ELIQUIS) 5 MG TABS tablet Take 1 tablet (5 mg total) by mouth 2 (two) times daily. 180 tablet 3   benralizumab (FASENRA) 10 MG/0.5ML prefilled syringe Inject as directed. Every 4 wks.     Budeson-Glycopyrrol-Formoterol (BREZTRI AEROSPHERE) 160-9-4.8 MCG/ACT AERO Inhale 2 puffs into the lungs in the morning and at bedtime. 10.7 g 11   Cholecalciferol 50 MCG (2000 UT) CAPS Take 1 capsule by mouth daily. PATIENT USING OIL AND NOT CAPSULES     diltiazem (CARDIZEM CD) 300 MG 24 hr capsule TAKE 1 CAPSULE BY MOUTH EVERY DAY 90 capsule 3   flecainide (TAMBOCOR) 50 MG tablet TAKE 1 TABLET BY MOUTH TWICE A DAY 180 tablet 2   furosemide (LASIX) 20 MG tablet Take 1 tablet (20 mg total)  by mouth as needed. Take 1 tablet as needed for 2-3 lbs gain overnight or 5 lbs gain in a week. 60 tablet 1   ipratropium-albuterol (DUONEB) 0.5-2.5 (3) MG/3ML SOLN Take 3 mLs by nebulization every 6 (six) hours as needed. 360 mL 0   levothyroxine (SYNTHROID) 100 MCG tablet TAKE 1 TABLET BY MOUTH EVERY MORNING ON AN EMPTY STOMACH Orally Once a day 90 days 90 tablet 4   losartan (COZAAR) 100 MG tablet TAKE 1 TABLET BY MOUTH EVERY DAY 90 tablet 1   Lutein 10 MG TABS Take 10 mg by mouth daily.     MAGNESIUM CITRATE PO Take 250 mg by mouth.      MULTIPLE VITAMIN PO Take 1 tablet by mouth daily.     Omega-3 Fatty Acids (FISH OIL) 1000 MG CAPS      pantoprazole (PROTONIX) 40 MG tablet TAKE 1 TABLET BY MOUTH EVERY DAY 90 tablet 1   potassium chloride (KLOR-CON M10) 10 MEQ tablet Take 1 tablet (10 mEq total) by mouth 3 (three) times daily. 270 tablet 0   rosuvastatin (CRESTOR) 10 MG tablet TAKE 1 TABLET BY MOUTH EVERY DAY 90 tablet 1   Spacer/Aero-Holding Chambers DEVI Use with spacer 1 each 2   valACYclovir (VALTREX) 500 MG tablet As needed     hydrochlorothiazide (HYDRODIURIL) 25 MG tablet Take 1 tablet (25 mg total) by mouth daily. (Patient not taking: Reported on 03/18/2023) 90 tablet 1   No current facility-administered medications for this visit.   Allergies: No Known Allergies I reviewed her past medical history, social history, family history, and environmental history and no significant changes have been reported from her previous visit.  Review of Systems  Constitutional:  Negative for appetite change, chills, fever and unexpected weight change.  HENT:  Negative for congestion and rhinorrhea.   Eyes:  Negative for itching.  Respiratory:  Positive for cough, shortness of breath and wheezing. Negative for chest tightness.   Cardiovascular:  Positive for leg swelling. Negative for chest pain.  Gastrointestinal:  Negative for abdominal pain.  Genitourinary:  Negative for difficulty urinating.  Skin:  Positive for rash.  Allergic/Immunologic: Positive for environmental allergies.  Neurological:  Negative for headaches.    Objective: BP 126/60   Pulse 72   Temp 98.5 F (36.9 C)   Resp 20   Ht 5\' 7"  (1.702 m)   Wt (!) 302 lb 9.6 oz (137.3 kg)   SpO2 94%   BMI 47.39 kg/m  Body mass index is 47.39 kg/m. Physical Exam Vitals and nursing note reviewed.  Constitutional:      Appearance: Normal appearance. She is well-developed. She is obese.  HENT:     Head: Normocephalic and atraumatic.     Right Ear: Tympanic  membrane and external ear normal.     Left Ear: Tympanic membrane and external ear normal.     Nose: Nose normal.     Mouth/Throat:     Mouth: Mucous membranes are moist.     Pharynx: Oropharynx is clear.  Eyes:     Conjunctiva/sclera: Conjunctivae normal.  Cardiovascular:     Rate and Rhythm: Normal rate and regular rhythm.     Heart sounds: Normal heart sounds. No murmur heard.    No friction rub. No gallop.  Pulmonary:     Effort: Pulmonary effort is normal.     Breath sounds: Normal breath sounds. No wheezing, rhonchi or rales.  Musculoskeletal:        General: Swelling (pitting edema  on lower extremities b/l.) present.     Cervical back: Neck supple.  Skin:    General: Skin is warm.     Findings: Rash present.     Comments: On lower extremities b/l - venous stasis?  Neurological:     Mental Status: She is alert and oriented to person, place, and time.  Psychiatric:        Behavior: Behavior normal.    Previous notes and tests were reviewed. The plan was reviewed with the patient/family, and all questions/concerned were addressed.  It was my pleasure to see Amy Stokes today and participate in her care. Please feel free to contact me with any questions or concerns.  Sincerely,  Wyline Mood, DO Allergy & Immunology  [facility] office: [phone] [facility] office: [phone]

## 2023-03-19 NOTE — Telephone Encounter (Signed)
Pt's O2 running low while using CPAP at night. Please advise, thank you!

## 2023-03-20 NOTE — Telephone Encounter (Signed)
Spoke with pt, she does not weigh and reports the swelling has been there since her fall thanksgiving. She was seen in the ER after the fall. The swelling does go down some overnight but gets swollen quickly after getting up. She did have the indication she had atrial fib on 12/18 but none since then. She is not always aware of when she has a fib. Patient given the okay to take another furosemdie now and then 40 mg on Saturday and Sunday morning. She will call back on Monday if her symptoms have not improved. Pt agreed with this plan.

## 2023-03-23 ENCOUNTER — Ambulatory Visit
Admission: RE | Admit: 2023-03-23 | Discharge: 2023-03-23 | Disposition: A | Discharge status: Home / Self Care | Payer: Medicare HMO | Source: Ambulatory Visit | Attending: Internal Medicine | Admitting: Internal Medicine

## 2023-03-23 ENCOUNTER — Telehealth: Payer: Self-pay | Admitting: *Deleted

## 2023-03-23 ENCOUNTER — Encounter (HOSPITAL_BASED_OUTPATIENT_CLINIC_OR_DEPARTMENT_OTHER): Payer: Self-pay

## 2023-03-23 DIAGNOSIS — Z1231 Encounter for screening mammogram for malignant neoplasm of breast: Secondary | ICD-10-CM

## 2023-03-23 NOTE — Telephone Encounter (Signed)
-----   Message from Ellamae Sia sent at 03/18/2023 11:04 AM EST ----- Can you start PA for Tezspire every 4 weeks - prefers at home injections. Will be switched from [facility]. Thank you.

## 2023-03-23 NOTE — Telephone Encounter (Signed)
Has echo upcoming 04/15/22. Recommend Furosemide 20mg  daily x 3 days then return to PRN dosing for weight gain of 2 lbs overnight or 5 lbs in one week. Recommend weighing daily and keeping a log.  Recommend checking BP (and HR if possible) and keeping log. Report back weight/BP in one week.   To prevent or reduce lower extremity swelling: Eat a low salt diet. Salt makes the body hold onto extra fluid which causes swelling. Sit with legs elevated. For example, in the recliner or on an ottoman.  Wear knee-high compression stockings during the daytime. Ones labeled 15-20 mmHg provide good compression.   Further changes likely pending echo result.   Alver Sorrow, NP

## 2023-03-23 NOTE — Telephone Encounter (Signed)
Called patient and discussed affordability for Tezspire and I did advise PPP with plan or we can try PAP but I can't make any promises I can get her on therapy due to their horrible PAP program and difficulty get approval for same. I will mail app to her and she is going to continue Fasenra until we can here from [employer] so she will at least be on something

## 2023-03-26 NOTE — Telephone Encounter (Signed)
Forms for Eliquis signed, faxed, and confirmation received

## 2023-04-02 ENCOUNTER — Other Ambulatory Visit: Payer: Self-pay | Admitting: *Deleted

## 2023-04-02 MED ORDER — FASENRA PEN 30 MG/ML ~~LOC~~ SOAJ: 30.0000 mg | SUBCUTANEOUS | 6 refills | Status: DC

## 2023-04-06 ENCOUNTER — Other Ambulatory Visit (HOSPITAL_BASED_OUTPATIENT_CLINIC_OR_DEPARTMENT_OTHER): Payer: Medicare HMO

## 2023-04-06 ENCOUNTER — Ambulatory Visit (HOSPITAL_BASED_OUTPATIENT_CLINIC_OR_DEPARTMENT_OTHER): Payer: Medicare HMO

## 2023-04-06 VITALS — Wt 303.1 lb

## 2023-04-06 DIAGNOSIS — I480 Paroxysmal atrial fibrillation: Secondary | ICD-10-CM

## 2023-04-06 NOTE — Patient Instructions (Addendum)
   Nurse Visit   Date of Encounter: 04/06/2023 ID: Amy Stokes, DOB 11/11/1950, MRN [id number]  PCP:  Charlett Apolinar POUR, MD   [facility] Providers Cardiologist:  [name], MD Electrophysiologist:  Soyla [name], MD      Visit Details   VS:  Wt (!) 303 lb 1.6 oz (137.5 kg)   BMI 47.47 kg/m  , BMI Body mass index is 47.47 kg/m.  Wt Readings from Last 3 Encounters:  04/06/23 (!) 303 lb 1.6 oz (137.5 kg)  03/18/23 (!) 302 lb 9.6 oz (137.3 kg)  03/12/23 (!) 303 lb 14.4 oz (137.8 kg)     Reason for visit: Zio monitor placement Performed today: Provider consulted:Caitlin Vannie, NP and Education Changes (medications, testing, etc.) : None Length of Visit: 10 minutes    Medications Adjustments/Labs and Tests Ordered: Your physician has recommended that you wear a Zio monitor.   This monitor is a medical device that records the heart's electrical activity. Doctors most often use these monitors to diagnose arrhythmias. Arrhythmias are problems with the speed or rhythm of the heartbeat. The monitor is a small device applied to your chest. You can wear one while you do your normal daily activities. While wearing this monitor if you have any symptoms to push the button and record what you felt. Once you have worn this monitor for the period of time provider prescribed (Usually 14 days), you will return the monitor device in the postage paid box. Once it is returned they will download the data collected and provide us  with a report which the provider will then review and we will call you with those results. Important tips:  Avoid showering during the first 24 hours of wearing the monitor. Avoid excessive sweating to help maximize wear time. Do not submerge the device, no hot tubs, and no swimming pools. Keep any lotions or oils away from the patch. After 24 hours you may shower with the patch on. Take brief showers with your back facing the shower head.  Do  not remove patch once it has been placed because that will interrupt data and decrease adhesive wear time. Push the button when you have any symptoms and write down what you were feeling. Once you have completed wearing your monitor, remove and place into box which has postage paid and place in your outgoing mailbox.  If for some reason you have misplaced your box then call our office and we can provide another box and/or mail it off for you.   Signed, Billy Rocco M Phylisha Dix, LPN  10/31/7972 8:73 PM

## 2023-04-07 ENCOUNTER — Encounter (HOSPITAL_BASED_OUTPATIENT_CLINIC_OR_DEPARTMENT_OTHER): Payer: Self-pay

## 2023-04-07 DIAGNOSIS — Z6841 Body Mass Index (BMI) 40.0 and over, adult: Secondary | ICD-10-CM | POA: Diagnosis not present

## 2023-04-07 DIAGNOSIS — R600 Localized edema: Secondary | ICD-10-CM

## 2023-04-07 DIAGNOSIS — Z01419 Encounter for gynecological examination (general) (routine) without abnormal findings: Secondary | ICD-10-CM | POA: Diagnosis not present

## 2023-04-07 DIAGNOSIS — Z1151 Encounter for screening for human papillomavirus (HPV): Secondary | ICD-10-CM | POA: Diagnosis not present

## 2023-04-07 DIAGNOSIS — Z1272 Encounter for screening for malignant neoplasm of vagina: Secondary | ICD-10-CM | POA: Diagnosis not present

## 2023-04-07 LAB — RESULTS CONSOLE HPV: CHL HPV: NEGATIVE

## 2023-04-07 MED ORDER — FUROSEMIDE 20 MG PO TABS: ORAL_TABLET | ORAL | 1 refills | Status: DC

## 2023-04-08 ENCOUNTER — Ambulatory Visit (INDEPENDENT_AMBULATORY_CARE_PROVIDER_SITE_OTHER): Payer: Medicare HMO | Admitting: Family Medicine

## 2023-04-08 ENCOUNTER — Encounter (HOSPITAL_BASED_OUTPATIENT_CLINIC_OR_DEPARTMENT_OTHER): Payer: Self-pay | Admitting: Family Medicine

## 2023-04-08 ENCOUNTER — Encounter (HOSPITAL_BASED_OUTPATIENT_CLINIC_OR_DEPARTMENT_OTHER): Payer: Self-pay | Admitting: *Deleted

## 2023-04-08 VITALS — BP 129/67 | HR 72 | Ht 67.0 in | Wt 300.0 lb

## 2023-04-08 DIAGNOSIS — M25562 Pain in left knee: Secondary | ICD-10-CM | POA: Diagnosis not present

## 2023-04-08 DIAGNOSIS — E876 Hypokalemia: Secondary | ICD-10-CM | POA: Diagnosis not present

## 2023-04-08 DIAGNOSIS — I482 Chronic atrial fibrillation, unspecified: Secondary | ICD-10-CM

## 2023-04-08 LAB — HM PAP SMEAR: HM Pap smear: NEGATIVE

## 2023-04-08 NOTE — Assessment & Plan Note (Signed)
 Improved since initial injury.  Exam generally reassuring in office today.  Reviewed imaging with patient today, imaging does show evidence of chronic degenerative changes, particularly in the medial compartment of left knee.  She is largely doing much better since time of injury.  Given progress thus far and examination in the office as well as x-ray imaging, would be reasonable to proceed with gradual return to normal activities.  Discussed this process, recommend beginning with slightly shorter duration and lower intensity of activities with gradual increase in duration over the coming weeks and subsequently with gradual increase in intensity thereafter.  If having any issues with this or return of pain, can return to office for further evaluation

## 2023-04-08 NOTE — Progress Notes (Signed)
 New Patient Office Visit  Subjective   Patient ID: Amy Stokes, female    DOB: 1950/04/22  Age: 73 y.o. MRN: [id number]  CC:  Chief Complaint  Patient presents with   New Patient (Initial Visit)    New patient just establishing care pt had fall in November knees are taking awhile to heal pt has been having more frequent episodes of afib and is currently wearing a heart monitor    HPI [name] presents to establish care Last PCP - Dr. Charlett  Had fall around Thanksgiving, has had associated left knee pain. She was walking upstairs, tripped on step and had injury to knee. Was seen in ED. Had imaging completed.  No acute injury observed on imaging.  She does feel that symptoms have largely improved since injury.  She has been doing light exercise, however does have questions about returning to prior level of activity.  She previously was exercising regularly in the gym downstairs here.  Prior to injury, she did not have any issues with knee pain.  Asthma: Patient does follow with pulmonology, current treatment includes Breztri  and as needed albuterol .  She rarely needs albuterol .  She additionally follows with a cardiology related to history of atrial fibrillation, hypertension, hyperlipidemia.  Reviewed medication list, currently taking medications as outlined below.  One difference is that she is currently taking furosemide  and is holding hydrochlorothiazide .  She has been having difficulty with controlling atrial fibrillation and is working with cardiology regarding this.  She currently is wearing a heart monitor and has echocardiogram scheduled upcoming  She follows with endocrinology related to hypothyroidism, takes levothyroxine . Sees Dr. Balan.  Reports that TSH has been well-controlled with current dose of levothyroxine   Patient is originally from [place]. Has lived here since 1997.  Her husband is also a patient of mine, Therapist, Nutritional.  Outpatient Encounter  Medications as of 04/08/2023  Medication Sig   acetaminophen  (TYLENOL ) 650 MG CR tablet Take 1,300 mg by mouth every 8 (eight) hours as needed for pain.   albuterol  (VENTOLIN  HFA) 108 (90 Base) MCG/ACT inhaler Inhale 2 puffs into the lungs every 4 (four) hours as needed for wheezing or shortness of breath (coughing fits).   apixaban  (ELIQUIS ) 5 MG TABS tablet Take 1 tablet (5 mg total) by mouth 2 (two) times daily.   benralizumab  (FASENRA  PEN) 30 MG/ML prefilled autoinjector Inject 1 mL (30 mg total) into the skin every 8 (eight) weeks.   Budeson-Glycopyrrol-Formoterol  (BREZTRI  AEROSPHERE) 160-9-4.8 MCG/ACT AERO Inhale 2 puffs into the lungs in the morning and at bedtime.   Cholecalciferol 50 MCG (2000 UT) CAPS Take 1 capsule by mouth daily. PATIENT USING OIL AND NOT CAPSULES   diltiazem  (CARDIZEM  CD) 300 MG 24 hr capsule TAKE 1 CAPSULE BY MOUTH EVERY DAY   flecainide  (TAMBOCOR ) 50 MG tablet TAKE 1 TABLET BY MOUTH TWICE A DAY   furosemide  (LASIX ) 20 MG tablet Take 20-40mg  as directed by cardiology office.   ipratropium-albuterol  (DUONEB) 0.5-2.5 (3) MG/3ML SOLN Take 3 mLs by nebulization every 6 (six) hours as needed.   levothyroxine  (SYNTHROID ) 100 MCG tablet TAKE 1 TABLET BY MOUTH EVERY MORNING ON AN EMPTY STOMACH Orally Once a day 90 days   losartan  (COZAAR ) 100 MG tablet TAKE 1 TABLET BY MOUTH EVERY DAY   Lutein  10 MG TABS Take 10 mg by mouth daily.   MAGNESIUM  CITRATE PO Take 250 mg by mouth.   MULTIPLE VITAMIN PO Take 1 tablet by mouth  daily.   Omega-3 Fatty Acids (FISH OIL) 1000 MG CAPS    pantoprazole  (PROTONIX ) 40 MG tablet TAKE 1 TABLET BY MOUTH EVERY DAY   potassium chloride  (KLOR-CON  M10) 10 MEQ tablet Take 1 tablet (10 mEq total) by mouth 3 (three) times daily.   rosuvastatin  (CRESTOR ) 10 MG tablet TAKE 1 TABLET BY MOUTH EVERY DAY   Spacer/Aero-Holding Chambers DEVI Use with spacer   valACYclovir  (VALTREX ) 500 MG tablet As needed   hydrochlorothiazide  (HYDRODIURIL ) 25 MG tablet Take  1 tablet (25 mg total) by mouth daily. (Patient not taking: Reported on 04/08/2023)   Magnesium  300 MG CAPS 1 capsule with a meal Orally Once a day   No facility-administered encounter medications on file as of 04/08/2023.    Past Medical History:  Diagnosis Date   Arthritis    no meds   Asthma    Edema    Fibroids    Hyperlipidemia    Hypertension    Obesity    RML pneumonia 02/25/2021   Sleep apnea    wears cpap    Thyroid  nodule 01/2009   left discovered on chest ct; needle bx indeterminant cannon r/o follicular neoplasm     Past Surgical History:  Procedure Laterality Date   ABDOMINAL HYSTERECTOMY Bilateral 08/02/2013   Procedure: TOTAL ABDOMINAL HYSTERECTOMY WITH BILATERAL SALPINGO-OOPHORECTOMY ;  Surgeon: Curlee VEAR Guan, MD;  Location: [facility];  Service: Gynecology;  Laterality: Bilateral;    Dr. Rockney to assist.  Dr. Lily to follow with an umbilical hernia repair. He will need an additional 1 1/2 hours.    COLONOSCOPY  12/27/2007   [facility]    HERNIA REPAIR     MOUTH SURGERY     thyroid  needle bx     TONSILLECTOMY     UMBILICAL HERNIA REPAIR N/A 08/02/2013   Procedure: HERNIA REPAIR UMBILICAL ADULT WITH MESH;  Surgeon: Krystal JINNY Lily, MD;  Location: [facility];  Service: General;  Laterality: N/A;    Family History  Problem Relation Age of Onset   Arthritis Mother    Other Mother        neurological disorder   Pancreatic cancer Mother    Heart disease Father    Thyroid  nodules Sister    Heart disease Brother        x2 1 brother deceased   Colon cancer Neg Hx    Colon polyps Neg Hx    Esophageal cancer Neg Hx    Rectal cancer Neg Hx    Stomach cancer Neg Hx    BRCA 1/2 Neg Hx    Breast cancer Neg Hx     Social History   Socioeconomic History   Marital status: Married    Spouse name: Not on file   Number of children: 0   Years of education: Not on file   Highest education level: Associate degree: academic program  Occupational History    Occupation: retired  Tobacco Use   Smoking status: Former    Current packs/day: 0.00    Average packs/day: 1 pack/day for 1 year (1.0 ttl pk-yrs)    Types: Cigarettes    Start date: 04/28/1970    Quit date: 04/29/1971    Years since quitting: 51.9    Passive exposure: Past   Smokeless tobacco: Never  Vaping Use   Vaping status: Never Used  Substance and Sexual Activity   Alcohol use: Not Currently    Comment: occasionally   Drug use: No   Sexual activity: Not on file  Other Topics Concern   Not on file  Social History Narrative   Married   Occupation: advertising account executive currently unemployed   Married   Regular exercise-no   HH of 2   Pet cat      Social Drivers of Corporate Investment Banker Strain: Low Risk  (04/04/2023)   Overall Financial Resource Strain (CARDIA)    Difficulty of Paying Living Expenses: Not very hard  Food Insecurity: No Food Insecurity (04/04/2023)   Hunger Vital Sign    Worried About Running Out of Food in the Last Year: Never true    Ran Out of Food in the Last Year: Never true  Transportation Needs: No Transportation Needs (04/04/2023)   PRAPARE - Administrator, Civil Service (Medical): No    Lack of Transportation (Non-Medical): No  Physical Activity: Sufficiently Active (04/04/2023)   Exercise Vital Sign    Days of Exercise per Week: 3 days    Minutes of Exercise per Session: 60 min  Stress: No Stress Concern Present (04/04/2023)   [employer] of Occupational Health - Occupational Stress Questionnaire    Feeling of Stress : Only a little  Social Connections: Moderately Integrated (04/04/2023)   Social Connection and Isolation Panel [NHANES]    Frequency of Communication with Friends and Family: Three times a week    Frequency of Social Gatherings with Friends and Family: Twice a week    Attends Religious Services: Never    Database Administrator or Organizations: No    Attends Engineer, Structural: More than 4 times per year     Marital Status: Married  Catering Manager Violence: Not At Risk (07/23/2022)   Humiliation, Afraid, Rape, and Kick questionnaire    Fear of Current or Ex-Partner: No    Emotionally Abused: No    Physically Abused: No    Sexually Abused: No   Objective   BP 129/67 (BP Location: Right Arm, Patient Position: Sitting, Cuff Size: Normal)   Pulse 72   Ht 5' 7 (1.702 m)   Wt 300 lb (136.1 kg)   SpO2 97%   BMI 46.99 kg/m   Physical Exam  73 year old female in no acute distress Cardiovascular exam with irregularly irregular rhythm, normal rate Lungs clear to auscultation bilaterally Left knee: limited due to body habitus No obvious deformity. No effusion.  Negative patellar grind.  Negative crepitus. Full ROM for flexion and extension.  Strength 5 out of 5 for flexion and extension. Lachman: Negative Varus stress test: Negative Valgus stress test: Negative Neurovascularly intact.  No evidence of lymphatic disease.  Assessment & Plan:   Acute pain of left knee Assessment & Plan: Improved since initial injury.  Exam generally reassuring in office today.  Reviewed imaging with patient today, imaging does show evidence of chronic degenerative changes, particularly in the medial compartment of left knee.  She is largely doing much better since time of injury.  Given progress thus far and examination in the office as well as x-ray imaging, would be reasonable to proceed with gradual return to normal activities.  Discussed this process, recommend beginning with slightly shorter duration and lower intensity of activities with gradual increase in duration over the coming weeks and subsequently with gradual increase in intensity thereafter.  If having any issues with this or return of pain, can return to office for further evaluation   Atrial fibrillation, chronic (HCC) Assessment & Plan: Currently in A-fib, recommend continue with current medication regimen as well as  and evaluation with  cardiology   Hypokalemia Assessment & Plan: Reports that this was being managed PCP.  Manages with use of oral potassium supplement.  Reports that she has generally been doing well with this, no concerns at this time.  Does not need refill medication currently   Return in about 6 months (around 10/06/2023).    ___________________________________________ Omere Marti de [place], MD, ABFM, CAQSM Primary Care and Sports Medicine [facility]

## 2023-04-08 NOTE — Assessment & Plan Note (Signed)
 Reports that this was being managed PCP.  Manages with use of oral potassium supplement.  Reports that she has generally been doing well with this, no concerns at this time.  Does not need refill medication currently

## 2023-04-08 NOTE — Assessment & Plan Note (Signed)
 Currently in A-fib, recommend continue with current medication regimen as well as and evaluation with cardiology

## 2023-04-08 NOTE — Patient Instructions (Signed)

## 2023-04-14 ENCOUNTER — Ambulatory Visit (INDEPENDENT_AMBULATORY_CARE_PROVIDER_SITE_OTHER): Payer: Medicare HMO | Admitting: Nurse Practitioner

## 2023-04-14 ENCOUNTER — Encounter: Payer: Self-pay | Admitting: Nurse Practitioner

## 2023-04-14 ENCOUNTER — Ambulatory Visit (INDEPENDENT_AMBULATORY_CARE_PROVIDER_SITE_OTHER): Payer: Medicare HMO

## 2023-04-14 VITALS — BP 130/66 | HR 84 | Ht 67.0 in | Wt 302.0 lb

## 2023-04-14 DIAGNOSIS — J9601 Acute respiratory failure with hypoxia: Secondary | ICD-10-CM

## 2023-04-14 DIAGNOSIS — G4733 Obstructive sleep apnea (adult) (pediatric): Secondary | ICD-10-CM | POA: Diagnosis not present

## 2023-04-14 DIAGNOSIS — J4550 Severe persistent asthma, uncomplicated: Secondary | ICD-10-CM

## 2023-04-14 DIAGNOSIS — J42 Unspecified chronic bronchitis: Secondary | ICD-10-CM | POA: Diagnosis not present

## 2023-04-14 DIAGNOSIS — R0902 Hypoxemia: Secondary | ICD-10-CM

## 2023-04-14 DIAGNOSIS — J9811 Atelectasis: Secondary | ICD-10-CM | POA: Diagnosis not present

## 2023-04-14 DIAGNOSIS — R0609 Other forms of dyspnea: Secondary | ICD-10-CM

## 2023-04-14 DIAGNOSIS — J45909 Unspecified asthma, uncomplicated: Secondary | ICD-10-CM | POA: Diagnosis not present

## 2023-04-14 DIAGNOSIS — E8770 Fluid overload, unspecified: Secondary | ICD-10-CM

## 2023-04-14 LAB — BASIC METABOLIC PANEL
BUN: 15 mg/dL (ref 6–23)
CO2: 32 meq/L (ref 19–32)
Calcium: 9.3 mg/dL (ref 8.4–10.5)
Chloride: 98 meq/L (ref 96–112)
Creatinine, Ser: 0.66 mg/dL (ref 0.40–1.20)
GFR: 87.87 mL/min (ref >60.00)
Glucose, Bld: 94 mg/dL (ref 70–99)
Potassium: 4 meq/L (ref 3.5–5.1)
Sodium: 138 meq/L (ref 135–145)

## 2023-04-14 LAB — CBC WITH DIFFERENTIAL/PLATELET
Basophils Absolute: 0 10*3/uL (ref 0.0–0.1)
Basophils Relative: 0.1 % (ref 0.0–3.0)
Eosinophils Absolute: 0 10*3/uL (ref 0.0–0.7)
Eosinophils Relative: 0 % (ref 0.0–5.0)
HCT: 38.6 % (ref 36.0–46.0)
Hemoglobin: 12.5 g/dL (ref 12.0–15.0)
Lymphocytes Relative: 12.4 % (ref 12.0–46.0)
Lymphs Abs: 1 10*3/uL (ref 0.7–4.0)
MCHC: 32.4 g/dL (ref 30.0–36.0)
MCV: 86.8 fL (ref 78.0–100.0)
Monocytes Absolute: 0.5 10*3/uL (ref 0.1–1.0)
Monocytes Relative: 5.9 % (ref 3.0–12.0)
Neutro Abs: 6.3 10*3/uL (ref 1.4–7.7)
Neutrophils Relative %: 81.6 % — ABNORMAL HIGH (ref 43.0–77.0)
Platelets: 267 10*3/uL (ref 150.0–400.0)
RBC: 4.44 Mil/uL (ref 3.87–5.11)
RDW: 14.6 % (ref 11.5–15.5)
WBC: 7.8 10*3/uL (ref 4.0–10.5)

## 2023-04-14 LAB — BRAIN NATRIURETIC PEPTIDE: Pro B Natriuretic peptide (BNP): 56 pg/mL (ref 0.0–100.0)

## 2023-04-14 LAB — POCT EXHALED NITRIC OXIDE: FeNO level (ppb): 13

## 2023-04-14 NOTE — Progress Notes (Signed)
 @Patient  ID: Amy Stokes, female    DOB: Apr 14, 1950, 73 y.o.   MRN: [id number]  Chief Complaint  Patient presents with   Acute Visit    Low O2 Levels    Referring provider: de [place], Quintin PARAS, MD  HPI: 73 year old female, former remote smoker followed for asthma and OSA. She is a patient of Dr. Cathye and last seen in office 12/02/2022. Past medical history significant for obesity, HTN, chronic A fib on Eliquis , hypothyroidism with thyroid  goiter, HLD. Followed by asthma/allergy  and started on Fasenra  June 2024.  TEST/EVENTS:  02/13/2014 HST: AHI 16.7, SpO2 low 70% 11/01/2020 echocardiogram: EF 55-60%, LV diastolic parameters were indeterminate. Previous echo with GIIDD. RV function nl but mildly enlarged. Unable to measure PASP.  07/23/2021 CPAP titration: optimal pressure 12 cmH2O  12/25/2021 IgE 2,005; eos 200  01/01/2022 PFT: FVC 54, FEV1 41, ratio 65, TLC 88, DLCOcor 98. Severe obstruction with reversibility (20% change).  03/11/2023 BNP 227  06/04/2021: OV with Martine Bleecker NP. Worsening cough and wheezing over the past month. CBC with elevated eosinophils at 0.3 and IgE elevated to 1409. Started on singulair  and short prednisone  burst. Referred to allergy  for further workup. Initiated therapies for postnasal drainage and cough control with mucinex , delsym, chlortab, and flonase . Reported some difficulties with increased SOB at night and drops in her oxygen  when she would get up to use the bathroom despite consistent use of her CPAP. CPAP titration was ordered for further evaluation for oxygen  requirement. Also reported some recent increase in BLE edema recently which cardiology had started her on lasix  for 4-5 days. Appeared compensated on exam. CXR without acute process; chronic linear opacities b/l unchanged and consistent with scarring.   07/05/2021: OV with Alicianna Litchford NP for follow up. She reports that she was feeling better and her cough had improved up until this past week. She felt like her  allergies slightly worsened and that her cough increased in frequency. Remained non-productive. She also was noted to be 84% on room air upon arrival to exam room; quickly recovered to 94% with rest. She denied any recent shortness of breath but has noticed some occasional wheezing. She has still noticed that her oxygen  levels have dropped into the 80's at night on her fit bit monitor, despite CPAP therapy. Plans for CPAP titration on 4/25. She reported that the swelling in her legs has improved as well, but she does notice that this is occasionally increased in the evenings. She has plans to follow up with cardiology. She continues on Advair  HFA Twice daily, flonase , singulair , and over the counter allergy  medicine. FeNO was nl and no evidence of bronchospasm on exam. Cough triggered by allergies. Added allegra  for allergies - appt scheduled for May with allergist due to hyper IgE and persistent allergy  symptoms.   09/12/2021: OV with Keiona Jenison NP for follow up. Since I saw her last, she underwent CPAP titration study and was seen by allergist. CPAP titration study was read by Dr. Shlomo who recommended auto CPAP 4-20 cmH2O and sent orders for new machine, which the patient has received. She has felt like she sleeps well on this and has not had any further oxygen  drops that she's noticed over night. Lowest SpO2 reading she has gotten has been 88%, but her average report in the morning from her watch is in the 90's. She feels like she has not been as short of breath at night with the new machine either. She was then seen by Dr  Kim with allergy , whom she was referred to for elevated IgE, and underwent allergy  testing with positives to mold, cockroach and dust mites. They stepped up her inhaler to Breztri  and continued her on allegra  and singulair . Discussed possible biologic therapy moving forward.  Today, she reports feeling relatively the same recently. She did notice that she was using her inhaler around 530 in the  morning and then not doing her second dose until around 9/10 pm at night. She tried changing this yesterday and did 530 am and 6 pm. She felt like this helped her breathing a lot and she wasn't as short of breath at the end of the day. She does still have an occasional cough, which is usually dry but she will occasionally get up some clear or white plegm, and some wheezing. Feels like allergies are better with Allegra . She denies hemoptysis, weight loss, fevers, night sweats, leg swelling.  FeNO 22 ppb   12/25/2021: OV with Dr. Neda. Acute visit with cough, wheeze, SOB. Treated with z pack and prednisone . Allergy  testing positive to mold, dust, roach. Consider biologic if symptoms persist. Repeat PFT. On CPAP; tolerating well.   01/28/2023: OV with Dr. Neda. Tolerating CPAP. Getting good nights rest. History of multiple allergies. History of asthma. Breathing stable. Follows with allergist. IgE remains elevated; did discuss with her allergist. Continue bronchodilators. Reviewed biologic.   12/02/2022: OV with Dr. Neda. Tolerating CPAP well. Wakes up feeling rested. Breathing stable. No significant exacerbations. Wondered whether she can stop singulair  and protonix . F/u one year.   04/14/2023: Today - follow up Discussed the use of AI scribe software for clinical note transcription with the patient, who gave verbal consent to proceed.  History of Present Illness   The patient, with a history of asthma and OSA, presented with concerns about lower oxygen  levels that began after a fall on Thanksgiving Day. Prior to the fall, the patient's oxygen  levels would occasionally drop, averaging around 90, sometimes as low as 88. However, post-fall, the patient noticed a decrease in oxygen  levels, with readings as low as 86 with activity on room air.   The patient denied any missed doses of Eliquis  and reported no change in breathing patterns since the fall. The patient's allergy  doctor, seen in early  December, suggested that the breathing issues were not related to asthma, which seemed to be under control. She does have a daily productive cough, which is unchanged from her baseline. She's not sure of the mucus color. No wheezing or chest congestion.   The patient reported increased leg swelling and a weight increase over the past few months. Prior to the fall, the patient did not track her weight, but it was noted to be 294 pounds in September. She did reach out to her heart doctor and is on lasix  20 mg daily but increases to 40 mg some days, depending on swelling. Her weight is still above her dry weight at 304 lb. She is also having some more difficulties laying flat at night. She's been sleeping in a recliner with her CPAP. An echocardiogram was scheduled for further investigation, which is tomorrow. She has not had any evidence of bleeding or excessive bruising. Denies calf pain, palpitations, CP, dizziness/lightheadedness, fever, chills, or hemoptysis.  The patient is on a regimen of Breztri , Fasenra , and PRN albuterol  for asthma, but has not felt the need to use the rescue inhaler recently. The patient is also wearing a heart monitor.       No Known  Allergies  Immunization History  Administered Date(s) Administered   Fluad Quad(high Dose 65+) 12/07/2019, 01/08/2021, 12/17/2021   Influenza Split 11/04/2012, 01/04/2014   Influenza, High Dose Seasonal PF 11/06/2016, 01/26/2018, 02/16/2019, 03/04/2023   Influenza,inj,Quad PF,6+ Mos 04/19/2015, 12/27/2015   Influenza-Unspecified 11/06/2016, 12/07/2019, 12/17/2021, 12/17/2021   Moderna Covid-19 Vaccine Bivalent Booster 34yrs & up 12/17/2021   PFIZER Comirnaty(Gray Top)Covid-19 Tri-Sucrose Vaccine 07/10/2020, 12/17/2021   PFIZER(Purple Top)SARS-COV-2 Vaccination 11/18/2018, 12/15/2018, 11/03/2019   Pfizer Covid-19 Vaccine Bivalent Booster 91yrs & up 01/08/2021   Pfizer(Comirnaty)Fall Seasonal Vaccine 12 years and older 12/17/2021    Pneumococcal Conjugate-13 04/03/2016   Pneumococcal Polysaccharide-23 01/31/2010, 04/11/2020   Respiratory Syncytial Virus Vaccine,Recomb Aduvanted(Arexvy) 02/18/2022   Tdap 07/28/2013, 12/28/2021   Unspecified SARS-COV-2 Vaccination 12/17/2021   Zoster Recombinant(Shingrix) 06/13/2017, 09/27/2017    Past Medical History:  Diagnosis Date   Arthritis    no meds   Asthma    Edema    Fibroids    Hyperlipidemia    Hypertension    Obesity    RML pneumonia 02/25/2021   Sleep apnea    wears cpap    Thyroid  nodule 01/2009   left discovered on chest ct; needle bx indeterminant cannon r/o follicular neoplasm     Tobacco History: Social History   Tobacco Use  Smoking Status Former   Current packs/day: 0.00   Average packs/day: 1 pack/day for 1 year (1.0 ttl pk-yrs)   Types: Cigarettes   Start date: 04/28/1970   Quit date: 04/29/1971   Years since quitting: 51.9   Passive exposure: Past  Smokeless Tobacco Never   Counseling given: Not Answered   Outpatient Medications Prior to Visit  Medication Sig Dispense Refill   acetaminophen  (TYLENOL ) 650 MG CR tablet Take 1,300 mg by mouth every 8 (eight) hours as needed for pain.     albuterol  (VENTOLIN  HFA) 108 (90 Base) MCG/ACT inhaler Inhale 2 puffs into the lungs every 4 (four) hours as needed for wheezing or shortness of breath (coughing fits). 18 g 1   apixaban  (ELIQUIS ) 5 MG TABS tablet Take 1 tablet (5 mg total) by mouth 2 (two) times daily. 180 tablet 3   benralizumab  (FASENRA  PEN) 30 MG/ML prefilled autoinjector Inject 1 mL (30 mg total) into the skin every 8 (eight) weeks. 1 mL 6   Budeson-Glycopyrrol-Formoterol  (BREZTRI  AEROSPHERE) 160-9-4.8 MCG/ACT AERO Inhale 2 puffs into the lungs in the morning and at bedtime. 10.7 g 11   Cholecalciferol 50 MCG (2000 UT) CAPS Take 1 capsule by mouth daily. PATIENT USING OIL AND NOT CAPSULES     diltiazem  (CARDIZEM  CD) 300 MG 24 hr capsule TAKE 1 CAPSULE BY MOUTH EVERY DAY 90 capsule 3    flecainide  (TAMBOCOR ) 50 MG tablet TAKE 1 TABLET BY MOUTH TWICE A DAY 180 tablet 2   furosemide  (LASIX ) 20 MG tablet Take 20-40mg  as directed by cardiology office. 60 tablet 1   hydrochlorothiazide  (HYDRODIURIL ) 25 MG tablet Take 1 tablet (25 mg total) by mouth daily. 90 tablet 1   ipratropium-albuterol  (DUONEB) 0.5-2.5 (3) MG/3ML SOLN Take 3 mLs by nebulization every 6 (six) hours as needed. 360 mL 0   levothyroxine  (SYNTHROID ) 100 MCG tablet TAKE 1 TABLET BY MOUTH EVERY MORNING ON AN EMPTY STOMACH Orally Once a day 90 days 90 tablet 4   losartan  (COZAAR ) 100 MG tablet TAKE 1 TABLET BY MOUTH EVERY DAY 90 tablet 1   Lutein  10 MG TABS Take 10 mg by mouth daily.     Magnesium  300 MG CAPS 1  capsule with a meal Orally Once a day     MAGNESIUM  CITRATE PO Take 250 mg by mouth.     MULTIPLE VITAMIN PO Take 1 tablet by mouth daily.     Omega-3 Fatty Acids (FISH OIL) 1000 MG CAPS      pantoprazole  (PROTONIX ) 40 MG tablet TAKE 1 TABLET BY MOUTH EVERY DAY 90 tablet 1   potassium chloride  (KLOR-CON  M10) 10 MEQ tablet Take 1 tablet (10 mEq total) by mouth 3 (three) times daily. 270 tablet 0   rosuvastatin  (CRESTOR ) 10 MG tablet TAKE 1 TABLET BY MOUTH EVERY DAY 90 tablet 1   Spacer/Aero-Holding Chambers DEVI Use with spacer 1 each 2   valACYclovir  (VALTREX ) 500 MG tablet As needed     No facility-administered medications prior to visit.     Review of Systems:   Constitutional: No weight loss or gain, night sweats, fevers, chills, or lassitude. +fatigue (baseline) HEENT: No headaches, difficulty swallowing, tooth/dental problems, or sore throat. No itching, ear ache. +occasional sneezing, nasal congestion (baseline) CV:  +PND, orthopnea, swelling in lower extremities. No chest pain, anasarca, dizziness, palpitations, syncope Resp: +chronic daily cough (baseline); shortness of breath with exertion (baseline). No excess mucus or change in color of mucus. No wheezing. No hemoptysis. No chest wall  deformity GI:  No heartburn, indigestion, abdominal pain, nausea, vomiting, diarrhea, change in bowel habits, loss of appetite, bloody stools.  GU: No dysuria, frequency, urgency, hematuria  Skin: No rash, lesions, ulcerations Neuro: No dizziness or lightheadedness.  Psych: No depression or anxiety. Mood stable.     Physical Exam:  BP 130/66 (BP Location: Right Arm, Patient Position: Sitting, Cuff Size: Large)   Pulse 84   Ht 5' 7 (1.702 m)   Wt (!) 302 lb (137 kg)   SpO2 94%   BMI 47.30 kg/m   GEN: Pleasant, interactive, well-kempt; obese; in no acute distress. HEENT:  Normocephalic and atraumatic. PERRLA. Sclera white. Nasal turbinates pink, moist and patent bilaterally. No rhinorrhea present. Oropharynx pink and moist, without exudate or edema. No lesions, ulcerations  NECK:  Supple w/ fair ROM. Mild JVD present. Normal carotid impulses w/o bruits. Thyroid  symmetrical with no goiter or nodules palpated. No lymphadenopathy.   CV: RRR, no m/r/g, +2-3 BLE pitting edema. Pulses intact, +2 bilaterally. No cyanosis, pallor or clubbing. PULMONARY:  Unlabored, regular breathing. Clear bilaterally A&P w/o wheezes/rales/rhonchi. No accessory muscle use. No dullness to percussion. GI: BS present and normoactive. Soft, non-tender to palpation. No organomegaly or masses detected.  MSK: No erythema, warmth or tenderness. Cap refil <2 sec all extrem. No deformities or joint swelling noted.  Neuro: A/Ox3. No focal deficits noted.   Skin: Warm, no lesions or rashe Psych: Normal affect and behavior. Judgement and thought content appropriate.     Lab Results:  CBC    Component Value Date/Time   WBC 8.3 03/11/2023 1641   RBC 4.14 03/11/2023 1641   HGB 11.7 (L) 03/11/2023 1641   HGB 12.8 01/10/2021 1016   HCT 36.5 03/11/2023 1641   HCT 39.7 01/10/2021 1016   PLT 291 03/11/2023 1641   PLT 266 01/10/2021 1016   MCV 88.2 03/11/2023 1641   MCV 84 01/10/2021 1016   MCH 28.3 03/11/2023 1641    MCHC 32.1 03/11/2023 1641   RDW 14.4 03/11/2023 1641   RDW 13.3 01/10/2021 1016   LYMPHSABS 1.4 11/13/2022 0909   MONOABS 0.6 11/13/2022 0909   EOSABS 0.0 11/13/2022 0909   BASOSABS 0.0 11/13/2022 0909  BMET    Component Value Date/Time   NA 140 03/11/2023 1641   NA 141 09/10/2021 0936   K 4.2 03/11/2023 1641   CL 98 03/11/2023 1641   CO2 36 (H) 03/11/2023 1641   GLUCOSE 106 (H) 03/11/2023 1641   BUN 21 03/11/2023 1641   BUN 12 09/10/2021 0936   CREATININE 0.70 03/11/2023 1641   CALCIUM  9.5 03/11/2023 1641   GFRNONAA >60 03/11/2023 1641   GFRAA 95 05/10/2008 0944    BNP    Component Value Date/Time   BNP 227.2 (H) 03/11/2023 1641     Imaging:  MM 3D SCREENING MAMMOGRAM BILATERAL BREAST Result Date: 03/27/2023 CLINICAL DATA:  Screening. EXAM: DIGITAL SCREENING BILATERAL MAMMOGRAM WITH TOMOSYNTHESIS AND CAD TECHNIQUE: Bilateral screening digital craniocaudal and mediolateral oblique mammograms were obtained. Bilateral screening digital breast tomosynthesis was performed. The images were evaluated with computer-aided detection. COMPARISON:  Previous exam(s). ACR Breast Density Category b: There are scattered areas of fibroglandular density. FINDINGS: There are no findings suspicious for malignancy. IMPRESSION: No mammographic evidence of malignancy. A result letter of this screening mammogram will be mailed directly to the patient. RECOMMENDATION: Screening mammogram in one year. (Code:SM-B-01Y) BI-RADS CATEGORY  1: Negative. Electronically Signed   By: Almarie Daring M.D.   On: 03/27/2023 15:18    Administration History     None          Latest Ref Rng & Units 01/01/2022   10:46 AM  PFT Results  FVC-Pre L 1.90   FVC-Predicted Pre % 54   FVC-Post L 2.06   FVC-Predicted Post % 58   Pre FEV1/FVC % % 59   Post FEV1/FCV % % 65   FEV1-Pre L 1.11   FEV1-Predicted Pre % 41   FEV1-Post L 1.34   DLCO uncorrected ml/min/mmHg 21.21   DLCO UNC% % 95   DLCO  corrected ml/min/mmHg 21.84   DLCO COR %Predicted % 98   DLVA Predicted % 149   TLC L 5.02   TLC % Predicted % 88   RV % Predicted % 124     Lab Results  Component Value Date   NITRICOXIDE 17 06/04/2021        Assessment & Plan:      Hypoxemia New onset hypoxemia post-fall on Thanksgiving. Oxygen  levels fluctuating between 86-93% on room air at home. Walk test today with desaturation to 87% on room air; unable to recover at rest. She required 3 lpm continuous supplemental O2 to recover to the 90's with SpO2 low 91%. She did have elevated BNP and slight anemia on labs from 03/11/2023. She has findings concerning for volume overload with weight gain of 8-10 lb and BLE edema. She is on chronic AC and admits to adherence with this so low likelihood for PE. Will recheck BNP, BMET and CBC today. Asthma exacerbation is less likely as lung exam clear and FeNO testing normal. Check CXR today to rule out acute process. Attend echocardiogram tomorrow to assess for HF/PH. Urgent new start O2 at 2-3 lpm with exertion for goal >88-90%. Sent home with portable tank and instructed to call if she has not heard from DME company by 3 pm today. ED precautions reviewed. Close follow up. - Order chest x-ray - Order labs - Send home with supplemental oxygen  - Review echocardiogram results  Fluid Overload Hypervolemic on exam. 8-10 pound weight gain over the past few months and BLE edema. BNP 03/11/2023 elevated at 227. Suspect driving factor for hypoxemia. Importance of monitoring weight  and fluid status discussed. If kidney function stable, will adjust diuretic regimen to attempt to get her back to her dry weight of 295-297 lb.  - Order labs including BNP - Attend echo  - Adjust furosemide  dosing pending labs  - Follow up with cardiology  - Monitor weight and fluid status  Asthma Asthma appears well-managed. Not using albuterol  rescue inhaler and continues with Fasenra  injections. Exhaled nitric oxide   testing normal. Lung exam clear. Asthma exacerbation less likely the cause of current symptoms. Action plan in place.  - Continue current asthma management  - Monitor for any changes in symptoms  Obstructive sleep apnea Compliant with CPAP therapy. Receives benefit from use. Will reassess download at f/u. - Continue CPAP use for sleep apnea  Follow-up - Schedule follow-up appointment in 7-10 days  - Reassess oxygen  needs and fluid status at follow-up.          I spent 50 minutes of dedicated to the care of this patient on the date of this encounter to include pre-visit review of records, face-to-face time with the patient discussing conditions above, post visit ordering of testing, clinical documentation with the electronic health record, making appropriate referrals as documented, and communicating necessary findings to members of the patients care team.  Comer LULLA Rouleau, NP 04/14/2023  Pt aware and understands NP's role.

## 2023-04-14 NOTE — Patient Instructions (Addendum)
 Continue Albuterol  inhaler 2 puffs or 3 mL neb every 6 hours as needed for shortness of breath or wheezing. Notify if symptoms persist despite rescue inhaler/neb use.  Continue Breztri  2 puffs Twice daily. Brush tongue and rinse mouth afterwards Continue fasenra  injections as scheduled  Start oxygen  2-3 lpm with activity for goal >88-90%. Monitor at rest and if above 90%, ok to have O2 off. We will evaluate to see if you need to have O2 bled through your CPAP at night   Labs today I may adjust your lasix  after we get your labs back   Chest x ray today  Attend echo tomorrow  Continue CPAP nightly  Follow up with cardiology as scheduled  Follow up in 7-10 days with Amy Stokes or Amy Stokes. Ok to double book KC. If symptoms do not improve or worsen, please contact office for sooner follow up or seek emergency care.

## 2023-04-15 ENCOUNTER — Other Ambulatory Visit: Payer: Self-pay | Admitting: Nurse Practitioner

## 2023-04-15 DIAGNOSIS — E8770 Fluid overload, unspecified: Secondary | ICD-10-CM

## 2023-04-15 DIAGNOSIS — J9601 Acute respiratory failure with hypoxia: Secondary | ICD-10-CM | POA: Diagnosis not present

## 2023-04-15 MED ORDER — POTASSIUM CHLORIDE CRYS ER 10 MEQ PO TBCR: 10.0000 meq | EXTENDED_RELEASE_TABLET | Freq: Every day | ORAL | 0 refills | Status: DC

## 2023-04-16 ENCOUNTER — Ambulatory Visit (INDEPENDENT_AMBULATORY_CARE_PROVIDER_SITE_OTHER): Payer: Medicare HMO

## 2023-04-16 ENCOUNTER — Telehealth: Payer: Self-pay | Admitting: Nurse Practitioner

## 2023-04-16 DIAGNOSIS — I10 Essential (primary) hypertension: Secondary | ICD-10-CM

## 2023-04-16 DIAGNOSIS — R600 Localized edema: Secondary | ICD-10-CM | POA: Diagnosis not present

## 2023-04-16 LAB — ECHOCARDIOGRAM COMPLETE
Area-P 1/2: 3.65 cm2
S' Lateral: 2.93 cm

## 2023-04-16 NOTE — Telephone Encounter (Signed)
The urgent order was signed the day of. The only thing that wasn't singed off on was my OV note, which was completed and signed in under 24 hours of seeing the pt. According to the rep who our [facility] spoke to, they had stated they had the order but could not process/deliver the order until they received the OV notes faxed. I have not had this happen before and I do not believe it is a Medicare requirement that they need the OV to process a STAT order for O2 that needs same day delivery. If we can just verify this with Trixie Dredge, that would be great. Thanks Margie!

## 2023-04-16 NOTE — Telephone Encounter (Signed)
Amy Stokes, please advise if oxygen is needing to be bled into cpap.    I spoke with Cathlean Cower again and explained that Rx was signed, however the OV note was not completed. He did agree that this was an error on their end and he will work with staff to ensure that this will not happen again.  He stated that he and the Regional manager, Crystal will be reviewing all oxygen order from here on out.

## 2023-04-16 NOTE — Telephone Encounter (Signed)
Please place order to bleed through CPAP 2 lpm at night. Will reassess with ONO once symptoms improve. Thanks.

## 2023-04-16 NOTE — Telephone Encounter (Signed)
I have spoken to Trixie Dredge with [facility] to regards to urgent oxygen order.. He stated that per the pharmacy board, oxygen can not be delivered until order has been signed. He stated that if this happens in the future to please reach out to him directly.   Routing to [employer] as an Financial planner,

## 2023-04-16 NOTE — Telephone Encounter (Signed)
04/16/2023: Pt stopped by the office to return oxygen tank. Was tearful and feeling overwhelmed with recent medical problems. Spoke with patient. No specific concerns; just worried with the new oxygen, medications and husband is currently out of town. She did correct her medication list and notified me she is taking 30 meq of potassium a day already. Advised her to continue this and not to take the extra 10 meq that was previously sent. She's taken 40 mg of lasix the past two days. Having some movement in her weight and was down to 297 lb at home. She is feeling a little better. Had echo today - results pending. Oxygen levels have been as low as 87% on room air with exertion at home but she recovers to the 90's as soon as she sits down. Plan is to see her next week. She will notify us if she doesn't continue to improve. ED precautions reviewed.

## 2023-04-16 NOTE — Telephone Encounter (Signed)
OK to double book PT for the end of next week w/Ms. Cobb.

## 2023-04-17 ENCOUNTER — Telehealth: Payer: Self-pay

## 2023-04-17 NOTE — Telephone Encounter (Signed)
Pt returned oxygen tank to our office. We do not loaner.  I have researched patient's chart and it appears that she was qualified for oxygen and a new start order was sent to [facility], as she is already established with them for cpap (as stated in the order). However, patient was given an [employer] tank. Patient did sign for this tank, however she has already been setup on oxygen with [facility].  My concern is that she is going to be charged by both DME companies.  I have left a message for Mitch with [employer] to discuss further.  *empty oxygen tank is in my office.

## 2023-04-17 NOTE — Telephone Encounter (Signed)
Order has been placed.

## 2023-04-20 ENCOUNTER — Encounter (HOSPITAL_BASED_OUTPATIENT_CLINIC_OR_DEPARTMENT_OTHER): Payer: Self-pay

## 2023-04-21 ENCOUNTER — Ambulatory Visit (INDEPENDENT_AMBULATORY_CARE_PROVIDER_SITE_OTHER): Payer: Medicare HMO | Admitting: *Deleted

## 2023-04-21 ENCOUNTER — Encounter (HOSPITAL_BASED_OUTPATIENT_CLINIC_OR_DEPARTMENT_OTHER): Payer: Self-pay | Admitting: *Deleted

## 2023-04-21 ENCOUNTER — Encounter (HOSPITAL_BASED_OUTPATIENT_CLINIC_OR_DEPARTMENT_OTHER): Payer: Self-pay

## 2023-04-21 DIAGNOSIS — Z0000 Encounter for general adult medical examination without abnormal findings: Secondary | ICD-10-CM

## 2023-04-21 NOTE — Progress Notes (Signed)
Subjective:   Amy Stokes is a 73 y.o. female who presents for Medicare Annual (Subsequent) preventive examination.  Visit Complete: Virtual I connected with  Amy Stokes on 04/21/23 by a audio enabled telemedicine application and verified that I am speaking with the correct person using two identifiers.  Patient Location: Other:  home  Provider Location: Office/Clinic  I discussed the limitations of evaluation and management by telemedicine. The patient expressed understanding and agreed to proceed.  Vital Signs: Because this visit was a virtual/telehealth visit, some criteria may be missing or patient reported. Any vitals not documented were not able to be obtained and vitals that have been documented are patient reported.  Patient Medicare AWV questionnaire was completed by the patient on 04/21/23; I have confirmed that all information answered by patient is correct and no changes since this date.        Objective:    There were no vitals filed for this visit. There is no height or weight on file to calculate BMI.     02/26/2023    7:07 PM 07/23/2022   10:47 AM 04/03/2022   10:15 AM 12/28/2021    5:17 PM 09/10/2021    8:29 AM 07/23/2021    8:28 PM 02/25/2021   11:34 PM  Advanced Directives  Does Patient Have a Medical Advance Directive? No No No No No No No  Would patient like information on creating a medical advance directive?  No - Patient declined No - Patient declined  No - Patient declined No - Patient declined No - Patient declined    Current Medications (verified) Outpatient Encounter Medications as of 04/21/2023  Medication Sig   acetaminophen (TYLENOL) 650 MG CR tablet Take 1,300 mg by mouth every 8 (eight) hours as needed for pain.   albuterol (VENTOLIN HFA) 108 (90 Base) MCG/ACT inhaler Inhale 2 puffs into the lungs every 4 (four) hours as needed for wheezing or shortness of breath (coughing fits).   apixaban (ELIQUIS) 5 MG TABS tablet Take 1 tablet (5  mg total) by mouth 2 (two) times daily.   benralizumab (FASENRA PEN) 30 MG/ML prefilled autoinjector Inject 1 mL (30 mg total) into the skin every 8 (eight) weeks.   Budeson-Glycopyrrol-Formoterol (BREZTRI AEROSPHERE) 160-9-4.8 MCG/ACT AERO Inhale 2 puffs into the lungs in the morning and at bedtime.   Cholecalciferol 50 MCG (2000 UT) CAPS Take 1 capsule by mouth daily. PATIENT USING OIL AND NOT CAPSULES   diltiazem (CARDIZEM CD) 300 MG 24 hr capsule TAKE 1 CAPSULE BY MOUTH EVERY DAY   flecainide (TAMBOCOR) 50 MG tablet TAKE 1 TABLET BY MOUTH TWICE A DAY   furosemide (LASIX) 20 MG tablet Take 20-40mg  as directed by cardiology office.   hydrochlorothiazide (HYDRODIURIL) 25 MG tablet Take 1 tablet (25 mg total) by mouth daily.   ipratropium-albuterol (DUONEB) 0.5-2.5 (3) MG/3ML SOLN Take 3 mLs by nebulization every 6 (six) hours as needed.   levothyroxine (SYNTHROID) 100 MCG tablet TAKE 1 TABLET BY MOUTH EVERY MORNING ON AN EMPTY STOMACH Orally Once a day 90 days   losartan (COZAAR) 100 MG tablet TAKE 1 TABLET BY MOUTH EVERY DAY   Lutein 10 MG TABS Take 10 mg by mouth daily.   Magnesium 300 MG CAPS 1 capsule with a meal Orally Once a day   MAGNESIUM CITRATE PO Take 250 mg by mouth.   MULTIPLE VITAMIN PO Take 1 tablet by mouth daily.   Omega-3 Fatty Acids (FISH OIL) 1000 MG CAPS  pantoprazole (PROTONIX) 40 MG tablet TAKE 1 TABLET BY MOUTH EVERY DAY   potassium chloride SA (KLOR-CON M) 10 MEQ tablet Take 1 tablet (10 mEq total) by mouth daily for 5 doses.   rosuvastatin (CRESTOR) 10 MG tablet TAKE 1 TABLET BY MOUTH EVERY DAY   Spacer/Aero-Holding Chambers DEVI Use with spacer   valACYclovir (VALTREX) 500 MG tablet As needed   No facility-administered encounter medications on file as of 04/21/2023.    Allergies (verified) Patient has no known allergies.   History: Past Medical History:  Diagnosis Date   Arthritis    no meds   Asthma    Edema    Fibroids    Hyperlipidemia     Hypertension    Obesity    RML pneumonia 02/25/2021   Sleep apnea    wears cpap    Thyroid nodule 01/2009   left discovered on chest ct; needle bx "indeterminant" cannon r/o follicular neoplasm    Past Surgical History:  Procedure Laterality Date   ABDOMINAL HYSTERECTOMY Bilateral 08/02/2013   Procedure: TOTAL ABDOMINAL HYSTERECTOMY WITH BILATERAL SALPINGO-OOPHORECTOMY ;  Surgeon: Ok Edwards, MD;  Location: [facility];  Service: Gynecology;  Laterality: Bilateral;    Dr. Audie Box to assist.  Dr. Abbey Chatters to follow with an umbilical hernia repair. He will need an additional 1 1/2 hours.    COLONOSCOPY  12/27/2007   [facility]    HERNIA REPAIR     MOUTH SURGERY     thyroid needle bx     TONSILLECTOMY     TOTAL ABDOMINAL HYSTERECTOMY Bilateral    UMBILICAL HERNIA REPAIR N/A 08/02/2013   Procedure: HERNIA REPAIR UMBILICAL ADULT WITH MESH;  Surgeon: Adolph Pollack, MD;  Location: [facility];  Service: General;  Laterality: N/A;   Family History  Problem Relation Age of Onset   Arthritis Mother    Other Mother        neurological disorder   Pancreatic cancer Mother    Heart disease Father    Thyroid nodules Sister    Heart disease Brother        x2 1 brother deceased   Colon cancer Neg Hx    Colon polyps Neg Hx    Esophageal cancer Neg Hx    Rectal cancer Neg Hx    Stomach cancer Neg Hx    BRCA 1/2 Neg Hx    Breast cancer Neg Hx    Social History   Socioeconomic History   Marital status: Married    Spouse name: Not on file   Number of children: 0   Years of education: Not on file   Highest education level: Associate degree: academic program  Occupational History   Occupation: retired  Tobacco Use   Smoking status: Former    Current packs/day: 0.00    Average packs/day: 1 pack/day for 1 year (1.0 ttl pk-yrs)    Types: Cigarettes    Start date: 04/28/1970    Quit date: 04/29/1971    Years since quitting: 52.0    Passive exposure: Past   Smokeless tobacco: Never   Vaping Use   Vaping status: Never Used  Substance and Sexual Activity   Alcohol use: Not Currently    Comment: occasionally   Drug use: No   Sexual activity: Not on file  Other Topics Concern   Not on file  Social History Narrative   Married   Occupation: Advertising account executive currently unemployed   Married   Regular exercise-no   HH of 2  Pet cat      Social Drivers of Health   Financial Resource Strain: Low Risk  (04/04/2023)   Overall Financial Resource Strain (CARDIA)    Difficulty of Paying Living Expenses: Not very hard  Food Insecurity: No Food Insecurity (04/04/2023)   Hunger Vital Sign    Worried About Running Out of Food in the Last Year: Never true    Ran Out of Food in the Last Year: Never true  Transportation Needs: No Transportation Needs (04/04/2023)   PRAPARE - Administrator, Civil Service (Medical): No    Lack of Transportation (Non-Medical): No  Physical Activity: Sufficiently Active (04/04/2023)   Exercise Vital Sign    Days of Exercise per Week: 3 days    Minutes of Exercise per Session: 60 min  Stress: No Stress Concern Present (04/04/2023)   [employer] of Occupational Health - Occupational Stress Questionnaire    Feeling of Stress : Only a little  Social Connections: Moderately Integrated (04/04/2023)   Social Connection and Isolation Panel [NHANES]    Frequency of Communication with Friends and Family: Three times a week    Frequency of Social Gatherings with Friends and Family: Twice a week    Attends Religious Services: Never    Database administrator or Organizations: No    Attends Engineer, structural: More than 4 times per year    Marital Status: Married    Tobacco Counseling Counseling given: Not Answered   Clinical Intake:                        Activities of Daily Living    04/21/2023    9:49 AM 07/23/2022   10:45 AM  In your present state of health, do you have any difficulty performing the following  activities:  Hearing? 0 0  Vision? 0 0  Difficulty concentrating or making decisions? 0 0  Walking or climbing stairs? 0 0  Dressing or bathing? 0 0  Doing errands, shopping? 0 0  Preparing Food and eating ? N N  Using the Toilet? N N  In the past six months, have you accidently leaked urine? Malvin Johns  Comment  Wears pads. Followed by Medical attention  Do you have problems with loss of bowel control? N N  Managing your Medications? N N  Managing your Finances? N N  Housekeeping or managing your Housekeeping? N N    Patient Care Team: [place], Buren Kos, MD as PCP - General (Family Medicine) Regan Lemming, MD as PCP - Electrophysiology (Cardiology) Jodelle Red, MD as PCP - Cardiology (Cardiology) Dorisann Frames, MD as Consulting Physician (Endocrinology) Tomma Lightning, MD as Consulting Physician (Pulmonary Disease) Zelphia Cairo, MD as Consulting Physician (Obstetrics and Gynecology) Ellamae Sia, DO as Consulting Physician (Allergy) Mateo Flow, MD as Consulting Physician (Ophthalmology) Sherrill Raring, [facility] (Pharmacist)  Indicate any recent Medical Services you may have received from other than [facility] providers in the past year (date may be approximate).     Assessment:   This is a routine wellness examination for Laykyn.  Hearing/Vision screen No results found.   Goals Addressed   None   Depression Screen    04/08/2023    9:37 AM 11/13/2022    8:42 AM 07/23/2022   10:33 AM 01/10/2022    8:18 AM 11/12/2021   10:08 AM 09/10/2021    8:30 AM 05/07/2021   11:01 AM  PHQ 2/9 Scores  PHQ -  2 Score 0 0 0 0 0 0 0  PHQ- 9 Score 0 0  0 0  0    Fall Risk    04/21/2023    9:49 AM 04/08/2023    9:35 AM 11/13/2022    8:42 AM 11/12/2022   10:26 AM 07/23/2022   10:47 AM  Fall Risk   Falls in the past year? 1 1 0 0 0  Number falls in past yr: 0 0 0  0  Injury with Fall? 1 1 0  0  Risk for fall due to :  History of fall(s) No Fall Risks  No Fall Risks   Follow up  Falls evaluation completed;Education provided;Falls prevention discussed Falls evaluation completed  Falls prevention discussed    MEDICARE RISK AT HOME: Medicare Risk at Home Any stairs in or around the home?: (Patient-Rptd) Yes If so, are there any without handrails?: (Patient-Rptd) No Home free of loose throw rugs in walkways, pet beds, electrical cords, etc?: (Patient-Rptd) Yes Adequate lighting in your home to reduce risk of falls?: (Patient-Rptd) Yes Life alert?: (Patient-Rptd) No Use of a cane, walker or w/c?: (Patient-Rptd) No Grab bars in the bathroom?: (Patient-Rptd) Yes Shower chair or bench in shower?: (Patient-Rptd) No Elevated toilet seat or a handicapped toilet?: (Patient-Rptd) No  TIMED UP AND GO:  Was the test performed?  No    Cognitive Function:        07/23/2022   10:47 AM 09/10/2021    8:32 AM 08/28/2020    8:21 AM  6CIT Screen  What Year? 0 points 0 points 0 points  What month? 0 points 0 points 0 points  What time? 0 points 0 points 0 points  Count back from 20 0 points 0 points 0 points  Months in reverse 0 points 0 points 0 points  Repeat phrase 2 points 0 points 2 points  Total Score 2 points 0 points 2 points    Immunizations Immunization History  Administered Date(s) Administered   Fluad Quad(high Dose 65+) 12/07/2019, 01/08/2021, 12/17/2021   Influenza Split 11/04/2012, 01/04/2014   Influenza, High Dose Seasonal PF 11/06/2016, 01/26/2018, 02/16/2019, 03/04/2023   Influenza,inj,Quad PF,6+ Mos 04/19/2015, 12/27/2015   Influenza-Unspecified 11/06/2016, 12/07/2019, 12/17/2021, 12/17/2021   Moderna Covid-19 Vaccine Bivalent Booster 70yrs & up 12/17/2021   PFIZER Comirnaty(Gray Top)Covid-19 Tri-Sucrose Vaccine 07/10/2020, 12/17/2021   PFIZER(Purple Top)SARS-COV-2 Vaccination 11/18/2018, 12/15/2018, 11/03/2019   Pfizer Covid-19 Vaccine Bivalent Booster 67yrs & up 01/08/2021   Pfizer(Comirnaty)Fall Seasonal Vaccine 12 years and older  12/17/2021   Pneumococcal Conjugate-13 04/03/2016   Pneumococcal Polysaccharide-23 01/31/2010, 04/11/2020   Respiratory Syncytial Virus Vaccine,Recomb Aduvanted(Arexvy) 02/18/2022   Tdap 07/28/2013, 12/28/2021   Unspecified SARS-COV-2 Vaccination 12/17/2021   Zoster Recombinant(Shingrix) 06/13/2017, 09/27/2017    TDAP status: Up to date  Flu Vaccine status: Up to date  Pneumococcal vaccine status: Up to date  Covid-19 vaccine status: Completed vaccines  Qualifies for Shingles Vaccine? Yes   Zostavax completed Yes   Shingrix Completed?: Yes  Screening Tests Health Maintenance  Topic Date Due   Cervical Cancer Screening (Pap smear)  03/20/2022   COVID-19 Vaccine (7 - 2024-25 season) 11/30/2022   Hepatitis C Screening  07/23/2023 (Originally 03/30/1969)   MAMMOGRAM  03/22/2024   Medicare Annual Wellness (AWV)  04/20/2024   Colonoscopy  05/28/2028   DTaP/Tdap/Td (3 - Td or Tdap) 12/29/2031   Pneumonia Vaccine 65+ Years old  Completed   INFLUENZA VACCINE  Completed   DEXA SCAN  Completed   Zoster Vaccines- Shingrix  Completed   HPV VACCINES  Aged Out    Health Maintenance  Health Maintenance Due  Topic Date Due   Cervical Cancer Screening (Pap smear)  03/20/2022   COVID-19 Vaccine (7 - 2024-25 season) 11/30/2022    Colorectal cancer screening: Type of screening: Colonoscopy. Completed 05/28/2018. Repeat every 10  years  Mammogram status: Completed 03/23/23. Repeat every year  Bone Density status: Ordered 04/27/23. Pt provided with contact info and advised to call to schedule appt.  Lung Cancer Screening: (Low Dose CT Chest recommended if Age 41-80 years, 20 pack-year currently smoking OR have quit w/in 15years.) does not qualify.   Lung Cancer Screening Referral: NA  Additional Screening:  Hepatitis C Screening: does qualify; Order  Vision Screening: Recommended annual ophthalmology exams for early detection of glaucoma and other disorders of the eye. Is the  patient up to date with their annual eye exam?  Yes  Who is the provider or what is the name of the office in which the patient attends annual eye exams? Dr Elmer Picker  If pt is not established with a provider, would they like to be referred to a provider to establish care? No .   Dental Screening: Recommended annual dental exams for proper oral hygiene    Community Resource Referral / Chronic Care Management: CRR required this visit?  No   CCM required this visit?  No     Plan:     I have personally reviewed and noted the following in the patient's chart:   Medical and social history Use of alcohol, tobacco or illicit drugs  Current medications and supplements including opioid prescriptions. Patient is not currently taking opioid prescriptions. Functional ability and status Nutritional status Physical activity Advanced directives List of other physicians Hospitalizations, surgeries, and ER visits in previous 12 months Vitals Screenings to include cognitive, depression, and falls Referrals and appointments  In addition, I have reviewed and discussed with patient certain preventive protocols, quality metrics, and best practice recommendations. A written personalized care plan for preventive services as well as general preventive health recommendations were provided to patient.     Park Breed, CMA   04/21/2023   After Visit Summary: (MyChart) Due to this being a telephonic visit, the after visit summary with patients personalized plan was offered to patient via MyChart   Nurse Notes:  Ms. Bearup , Thank you for taking time to come for your Medicare Wellness Visit. I appreciate your ongoing commitment to your health goals. Please review the following plan we discussed and let me know if I can assist you in the future.   These are the goals we discussed:  Goals       No current goals (pt-stated)      Patient Stated      More physically fit       Patient Stated       09/10/2021, stay fit, remain independent      Patient Stated      Working on losing weight and staying fit        This is a list of the screening recommended for you and due dates:  Health Maintenance  Topic Date Due   Pap Smear  03/20/2022   COVID-19 Vaccine (7 - 2024-25 season) 11/30/2022   Hepatitis C Screening  07/23/2023*   Mammogram  03/22/2024   Medicare Annual Wellness Visit  04/20/2024   Colon Cancer Screening  05/28/2028   DTaP/Tdap/Td vaccine (3 - Td or Tdap) 12/29/2031   Pneumonia  Vaccine  Completed   Flu Shot  Completed   DEXA scan (bone density measurement)  Completed   Zoster (Shingles) Vaccine  Completed   HPV Vaccine  Aged Out  *Topic was postponed. The date shown is not the original due date.

## 2023-04-21 NOTE — Patient Instructions (Signed)
Amy Stokes , Thank you for taking time to come for your Medicare Wellness Visit. I appreciate your ongoing commitment to your health goals. Please review the following plan we discussed and let me know if I can assist you in the future.   Screening recommendations/referrals: Colonoscopy: completed Mammogram: completed Bone Density: scheduled Recommended yearly ophthalmology/optometry visit for glaucoma screening and checkup Recommended yearly dental visit for hygiene and checkup  Vaccinations: Influenza vaccine: completed Pneumococcal vaccine: completed Tdap vaccine: completed Shingles vaccine: completed    Advanced directives: has information  Conditions/risks identified: falls, hypertension  Next appointment: 1 year   Preventive Care 65 Years and Older, Female Preventive care refers to lifestyle choices and visits with your health care provider that can promote health and wellness. What does preventive care include? A yearly physical exam. This is also called an annual well check. Dental exams once or twice a year. Routine eye exams. Ask your health care provider how often you should have your eyes checked. Personal lifestyle choices, including: Daily care of your teeth and gums. Regular physical activity. Eating a healthy diet. Avoiding tobacco and drug use. Limiting alcohol use. Practicing safe sex. Taking low-dose aspirin every day. Taking vitamin and mineral supplements as recommended by your health care provider. What happens during an annual well check? The services and screenings done by your health care provider during your annual well check will depend on your age, overall health, lifestyle risk factors, and family history of disease. Counseling  Your health care provider may ask you questions about your: Alcohol use. Tobacco use. Drug use. Emotional well-being. Home and relationship well-being. Sexual activity. Eating habits. History of falls. Memory  and ability to understand (cognition). Work and work Astronomer. Reproductive health. Screening  You may have the following tests or measurements: Height, weight, and BMI. Blood pressure. Lipid and cholesterol levels. These may be checked every 5 years, or more frequently if you are over 51 years old. Skin check. Lung cancer screening. You may have this screening every year starting at age 42 if you have a 30-pack-year history of smoking and currently smoke or have quit within the past 15 years. Fecal occult blood test (FOBT) of the stool. You may have this test every year starting at age 30. Flexible sigmoidoscopy or colonoscopy. You may have a sigmoidoscopy every 5 years or a colonoscopy every 10 years starting at age 26. Hepatitis C blood test. Hepatitis B blood test. Sexually transmitted disease (STD) testing. Diabetes screening. This is done by checking your blood sugar (glucose) after you have not eaten for a while (fasting). You may have this done every 1-3 years. Bone density scan. This is done to screen for osteoporosis. You may have this done starting at age 59. Mammogram. This may be done every 1-2 years. Talk to your health care provider about how often you should have regular mammograms. Talk with your health care provider about your test results, treatment options, and if necessary, the need for more tests. Vaccines  Your health care provider may recommend certain vaccines, such as: Influenza vaccine. This is recommended every year. Tetanus, diphtheria, and acellular pertussis (Tdap, Td) vaccine. You may need a Td booster every 10 years. Zoster vaccine. You may need this after age 56. Pneumococcal 13-valent conjugate (PCV13) vaccine. One dose is recommended after age 67. Pneumococcal polysaccharide (PPSV23) vaccine. One dose is recommended after age 22. Talk to your health care provider about which screenings and vaccines you need and how often you need them. This  information is not intended to replace advice given to you by your health care provider. Make sure you discuss any questions you have with your health care provider. Document Released: 04/13/2015 Document Revised: 12/05/2015 Document Reviewed: 01/16/2015 Elsevier Interactive Patient Education  2017 [employer].  Fall Prevention in the Home Falls can cause injuries. They can happen to people of all ages. There are many things you can do to make your home safe and to help prevent falls. What can I do on the outside of my home? Regularly fix the edges of walkways and driveways and fix any cracks. Remove anything that might make you trip as you walk through a door, such as a raised step or threshold. Trim any bushes or trees on the path to your home. Use bright outdoor lighting. Clear any walking paths of anything that might make someone trip, such as rocks or tools. Regularly check to see if handrails are loose or broken. Make sure that both sides of any steps have handrails. Any raised decks and porches should have guardrails on the edges. Have any leaves, snow, or ice cleared regularly. Use sand or salt on walking paths during winter. Clean up any spills in your garage right away. This includes oil or grease spills. What can I do in the bathroom? Use night lights. Install grab bars by the toilet and in the tub and shower. Do not use towel bars as grab bars. Use non-skid mats or decals in the tub or shower. If you need to sit down in the shower, use a plastic, non-slip stool. Keep the floor dry. Clean up any water that spills on the floor as soon as it happens. Remove soap buildup in the tub or shower regularly. Attach bath mats securely with double-sided non-slip rug tape. Do not have throw rugs and other things on the floor that can make you trip. What can I do in the bedroom? Use night lights. Make sure that you have a light by your bed that is easy to reach. Do not use any sheets or  blankets that are too big for your bed. They should not hang down onto the floor. Have a firm chair that has side arms. You can use this for support while you get dressed. Do not have throw rugs and other things on the floor that can make you trip. What can I do in the kitchen? Clean up any spills right away. Avoid walking on wet floors. Keep items that you use a lot in easy-to-reach places. If you need to reach something above you, use a strong step stool that has a grab bar. Keep electrical cords out of the way. Do not use floor polish or wax that makes floors slippery. If you must use wax, use non-skid floor wax. Do not have throw rugs and other things on the floor that can make you trip. What can I do with my stairs? Do not leave any items on the stairs. Make sure that there are handrails on both sides of the stairs and use them. Fix handrails that are broken or loose. Make sure that handrails are as long as the stairways. Check any carpeting to make sure that it is firmly attached to the stairs. Fix any carpet that is loose or worn. Avoid having throw rugs at the top or bottom of the stairs. If you do have throw rugs, attach them to the floor with carpet tape. Make sure that you have a light switch at the top of  the stairs and the bottom of the stairs. If you do not have them, ask someone to add them for you. What else can I do to help prevent falls? Wear shoes that: Do not have high heels. Have rubber bottoms. Are comfortable and fit you well. Are closed at the toe. Do not wear sandals. If you use a stepladder: Make sure that it is fully opened. Do not climb a closed stepladder. Make sure that both sides of the stepladder are locked into place. Ask someone to hold it for you, if possible. Clearly mark and make sure that you can see: Any grab bars or handrails. First and last steps. Where the edge of each step is. Use tools that help you move around (mobility aids) if they are  needed. These include: Canes. Walkers. Scooters. Crutches. Turn on the lights when you go into a dark area. Replace any light bulbs as soon as they burn out. Set up your furniture so you have a clear path. Avoid moving your furniture around. If any of your floors are uneven, fix them. If there are any pets around you, be aware of where they are. Review your medicines with your doctor. Some medicines can make you feel dizzy. This can increase your chance of falling. Ask your doctor what other things that you can do to help prevent falls. This information is not intended to replace advice given to you by your health care provider. Make sure you discuss any questions you have with your health care provider. Document Released: 01/11/2009 Document Revised: 08/23/2015 Document Reviewed: 04/21/2014 Elsevier Interactive Patient Education  2017 [employer].

## 2023-04-24 ENCOUNTER — Ambulatory Visit (INDEPENDENT_AMBULATORY_CARE_PROVIDER_SITE_OTHER): Payer: Medicare HMO | Admitting: Nurse Practitioner

## 2023-04-24 ENCOUNTER — Encounter: Payer: Self-pay | Admitting: Nurse Practitioner

## 2023-04-24 VITALS — BP 131/67 | HR 86 | Temp 98.0°F | Ht 67.0 in | Wt 295.8 lb

## 2023-04-24 DIAGNOSIS — G4733 Obstructive sleep apnea (adult) (pediatric): Secondary | ICD-10-CM | POA: Diagnosis not present

## 2023-04-24 DIAGNOSIS — J4540 Moderate persistent asthma, uncomplicated: Secondary | ICD-10-CM

## 2023-04-24 DIAGNOSIS — E8770 Fluid overload, unspecified: Secondary | ICD-10-CM | POA: Insufficient documentation

## 2023-04-24 DIAGNOSIS — J9601 Acute respiratory failure with hypoxia: Secondary | ICD-10-CM | POA: Diagnosis not present

## 2023-04-24 LAB — BASIC METABOLIC PANEL
BUN: 15 mg/dL (ref 6–23)
CO2: 33 meq/L — ABNORMAL HIGH (ref 19–32)
Calcium: 9.3 mg/dL (ref 8.4–10.5)
Chloride: 96 meq/L (ref 96–112)
Creatinine, Ser: 0.7 mg/dL (ref 0.40–1.20)
GFR: 86.62 mL/min (ref >60.00)
Glucose, Bld: 103 mg/dL — ABNORMAL HIGH (ref 70–99)
Potassium: 3.9 meq/L (ref 3.5–5.1)
Sodium: 138 meq/L (ref 135–145)

## 2023-04-24 NOTE — Assessment & Plan Note (Signed)
Secondary to volume overload. Significant improvement with diuresis. Walk test today with brief desaturation to 88% on room air, recovered to 91% without rest or need for supplemental O2; otherwise, maintained saturations >90%. Advised her to continue to monitor at home. If she remains stable, can d/c at follow up. Will have her complete ONO on CPAP/room air to determine if continued O2 therapy at night warranted. May need titration study if she does have hypoxia. Goal >88-90%  Patient Instructions  Continue Albuterol inhaler 2 puffs or 3 mL neb every 6 hours as needed for shortness of breath or wheezing. Notify if symptoms persist despite rescue inhaler/neb use.  Continue Breztri 2 puffs Twice daily. Brush tongue and rinse mouth afterwards Continue fasenra injections as scheduled Ok to stay off oxygen. Monitor and if above 88-90%, ok to keep O2 off. We will hopefully be able to discontinue this when you come back but would like to keep an eye on things and make sure you're stable in the meantime.  Continue lasix 20 mg daily and increase to 40 mg daily for weight gain 2-3 lb overnight or 5 lb in a week or worsening swelling. If you can't get the weight back down or swelling to decrease, call your heart doctor or me   Overnight oxygen study off oxygen and on CPAP    Labs today   Continue CPAP nightly   Follow up in 6-8 weeks with Dr. Wynona Neat or Katie Dinisha Cai,NP. If symptoms do not improve or worsen, please contact office for sooner follow up or seek emergency care.

## 2023-04-24 NOTE — Patient Instructions (Signed)
Continue Albuterol inhaler 2 puffs or 3 mL neb every 6 hours as needed for shortness of breath or wheezing. Notify if symptoms persist despite rescue inhaler/neb use.  Continue Breztri 2 puffs Twice daily. Brush tongue and rinse mouth afterwards Continue fasenra injections as scheduled Ok to stay off oxygen. Monitor and if above 88-90%, ok to keep O2 off. We will hopefully be able to discontinue this when you come back but would like to keep an eye on things and make sure you're stable in the meantime.  Continue lasix 20 mg daily and increase to 40 mg daily for weight gain 2-3 lb overnight or 5 lb in a week or worsening swelling. If you can't get the weight back down or swelling to decrease, call your heart doctor or me   Overnight oxygen study off oxygen and on CPAP    Labs today   Continue CPAP nightly   Follow up in 6-8 weeks with Dr. Wynona Neat or Katie Makisha Marrin,NP. If symptoms do not improve or worsen, please contact office for sooner follow up or seek emergency care.

## 2023-04-24 NOTE — Progress Notes (Signed)
@Patient  ID: Amy Stokes, female    DOB: 02-02-1951, 73 y.o.   MRN: [id number]  Chief Complaint  Patient presents with   Follow-up    Referring provider: de [place], Buren Kos, MD  HPI: 73 year old female, former remote smoker followed for asthma and OSA. She is a patient of Dr. Trena Platt and last seen in office 04/14/2023 by [facility] NP. Past medical history significant for obesity, HTN, chronic A fib on Eliquis, hypothyroidism with thyroid goiter, HLD. Followed by asthma/allergy and started on Fasenra June 2024.  TEST/EVENTS:  02/13/2014 HST: AHI 16.7, SpO2 low 70% 11/01/2020 echocardiogram: EF 55-60%, LV diastolic parameters were indeterminate. Previous echo with GIIDD. RV function nl but mildly enlarged. Unable to measure PASP.  07/23/2021 CPAP titration: optimal pressure 12 cmH2O  12/25/2021 IgE 2,005; eos 200  01/01/2022 PFT: FVC 54, FEV1 41, ratio 65, TLC 88, DLCOcor 98. Severe obstruction with reversibility (20% change).  03/11/2023 BNP 227 04/14/2023 CXR: minimal bibasilar atelectasis. Mild chronic bronchitic changes 04/16/2023 echo: EF 60 to 65%.  Left ventricular diastolic parameters are indeterminate.  RV function normal.  RV size moderately enlarged.  LA mildly dilated.  Mild MR.  06/04/2021: OV with Hugh Kamara NP. Worsening cough and wheezing over the past month. CBC with elevated eosinophils at 0.3 and IgE elevated to 1409. Started on singulair and short prednisone burst. Referred to allergy for further workup. Initiated therapies for postnasal drainage and cough control with mucinex, delsym, chlortab, and flonase. Reported some difficulties with increased SOB at night and drops in her oxygen when she would get up to use the bathroom despite consistent use of her CPAP. CPAP titration was ordered for further evaluation for oxygen requirement. Also reported some recent increase in BLE edema recently which cardiology had started her on lasix for 4-5 days. Appeared compensated on exam. CXR without  acute process; chronic linear opacities b/l unchanged and consistent with scarring.   07/05/2021: OV with Dewie Ahart NP for follow up. She reports that she was feeling better and her cough had improved up until this past week. She felt like her allergies slightly worsened and that her cough increased in frequency. Remained non-productive. She also was noted to be 84% on room air upon arrival to exam room; quickly recovered to 94% with rest. She denied any recent shortness of breath but has noticed some occasional wheezing. She has still noticed that her oxygen levels have dropped into the 80's at night on her fit bit monitor, despite CPAP therapy. Plans for CPAP titration on 4/25. She reported that the swelling in her legs has improved as well, but she does notice that this is occasionally increased in the evenings. She has plans to follow up with cardiology. She continues on Advair HFA Twice daily, flonase, singulair, and over the counter allergy medicine. FeNO was nl and no evidence of bronchospasm on exam. Cough triggered by allergies. Added allegra for allergies - appt scheduled for May with allergist due to hyper IgE and persistent allergy symptoms.   09/12/2021: OV with Trampus Mcquerry NP for follow up. Since I saw her last, she underwent CPAP titration study and was seen by allergist. CPAP titration study was read by Dr. Mayford Knife who recommended auto CPAP 4-20 cmH2O and sent orders for new machine, which the patient has received. She has felt like she sleeps well on this and has not had any further oxygen drops that she's noticed over night. Lowest SpO2 reading she has gotten has been 88%, but her average report  in the morning from her watch is in the 90's. She feels like she has not been as short of breath at night with the new machine either. She was then seen by Dr Selena Batten with allergy, whom she was referred to for elevated IgE, and underwent allergy testing with positives to mold, cockroach and dust mites. They stepped up her  inhaler to [facility] and continued her on allegra and singulair. Discussed possible biologic therapy moving forward.  Today, she reports feeling relatively the same recently. She did notice that she was using her inhaler around 530 in the morning and then not doing her second dose until around 9/10 pm at night. She tried changing this yesterday and did 530 am and 6 pm. She felt like this helped her breathing a lot and she wasn't as short of breath at the end of the day. She does still have an occasional cough, which is usually dry but she will occasionally get up some clear or white plegm, and some wheezing. Feels like allergies are better with Allegra. She denies hemoptysis, weight loss, fevers, night sweats, leg swelling.  FeNO 22 ppb   12/25/2021: OV with Dr. Wynona Neat. Acute visit with cough, wheeze, SOB. Treated with z pack and prednisone. Allergy testing positive to mold, dust, roach. Consider biologic if symptoms persist. Repeat PFT. On CPAP; tolerating well.   01/28/2023: OV with Dr. Wynona Neat. Tolerating CPAP. Getting good nights rest. History of multiple allergies. History of asthma. Breathing stable. Follows with allergist. IgE remains elevated; did discuss with her allergist. Continue bronchodilators. Reviewed biologic.   12/02/2022: OV with Dr. Wynona Neat. Tolerating CPAP well. Wakes up feeling rested. Breathing stable. No significant exacerbations. Wondered whether she can stop singulair and protonix. F/u one year.   04/14/2023: OV with Ferdie Bakken NP. The patient, with a history of asthma and OSA, presented with concerns about lower oxygen levels that began after a fall on Thanksgiving Day. Prior to the fall, the patient's oxygen levels would occasionally drop, averaging around 90, sometimes as low as 88. However, post-fall, the patient noticed a decrease in oxygen levels, with readings as low as 86 with activity on room air.    The patient denied any missed doses of Eliquis and reported no change in  breathing patterns since the fall. The patient's allergy doctor, seen in early December, suggested that the breathing issues were not related to asthma, which seemed to be under control. She does have a daily productive cough, which is unchanged from her baseline. She's not sure of the mucus color. No wheezing or chest congestion.    The patient reported increased leg swelling and a weight increase over the past few months. Prior to the fall, the patient did not track her weight, but it was noted to be 294 pounds in September. She did reach out to her heart doctor and is on lasix 20 mg daily but increases to 40 mg some days, depending on swelling. Her weight is still above her dry weight at 304 lb. She is also having some more difficulties laying flat at night. She's been sleeping in a recliner with her CPAP. An echocardiogram was scheduled for further investigation, which is tomorrow. She has not had any evidence of bleeding or excessive bruising. Denies calf pain, palpitations, CP, dizziness/lightheadedness, fever, chills, or hemoptysis.   The patient is on a regimen of Breztri, Fasenra, and PRN albuterol for asthma, but has not felt the need to use the rescue inhaler recently. The patient is also wearing a  heart monitor.   04/24/2023: Today - follow up Patient presents today for follow-up.  At her last visit, she was started on supplemental oxygen due to exertional hypoxemia.  At the time, she had had significant leg swelling and increased weight gain, concerning for volume overload.  She had also had a minimal elevation in her BNP.  We adjusted her Lasix dosing, which she had a great response to. Today, she is down to 295 pounds which is her normal weight.  She had been up to 302 pounds at her last visit.  She feels like the swelling in her legs has gone down.  She is currently taking 20 mg of Lasix a day.  She did have a little bit of an increased weight after eating [place] food the other day and took  40 mg of Lasix with improvement.  Feels like her breathing is better.  She did not really notice how out of breath she was before hand.  Feels like she is back to her baseline now.  Not noticing any low oxygen levels at home.  Not having any increased cough or chest congestion.  Wearing her CPAP nightly.  Has oxygen bled through it.  Has not really been using the supplemental oxygen during the day at all.  She did have an echocardiogram 1/16 that showed normal EF, indeterminate diastolic parameters, LA strain, RV strain.  They did not read her pulmonary artery pressures.  03/24/2023-04/22/2023: CPAP 4-16 cmH2O 30/30 days; 97% >4 hr; average use 8 hours 31 minutes Pressure 95th 7.7 Leaks 95th 4.1 AHI 1.1  No Known Allergies  Immunization History  Administered Date(s) Administered   Fluad Quad(high Dose 65+) 12/07/2019, 01/08/2021, 12/17/2021   Influenza Split 11/04/2012, 01/04/2014   Influenza, High Dose Seasonal PF 11/06/2016, 01/26/2018, 02/16/2019, 03/04/2023   Influenza,inj,Quad PF,6+ Mos 04/19/2015, 12/27/2015   Influenza-Unspecified 11/06/2016, 12/07/2019, 12/17/2021, 12/17/2021   Moderna Covid-19 Vaccine Bivalent Booster 32yrs & up 12/17/2021   PFIZER Comirnaty(Gray Top)Covid-19 Tri-Sucrose Vaccine 07/10/2020, 12/17/2021   PFIZER(Purple Top)SARS-COV-2 Vaccination 11/18/2018, 12/15/2018, 11/03/2019   Pfizer Covid-19 Vaccine Bivalent Booster 38yrs & up 01/08/2021   Pfizer(Comirnaty)Fall Seasonal Vaccine 12 years and older 12/17/2021   Pneumococcal Conjugate-13 04/03/2016   Pneumococcal Polysaccharide-23 01/31/2010, 04/11/2020   Respiratory Syncytial Virus Vaccine,Recomb Aduvanted(Arexvy) 02/18/2022   Tdap 07/28/2013, 12/28/2021   Unspecified SARS-COV-2 Vaccination 12/17/2021   Zoster Recombinant(Shingrix) 06/13/2017, 09/27/2017    Past Medical History:  Diagnosis Date   Arthritis    no meds   Asthma    Edema    Fibroids    Hyperlipidemia    Hypertension    Obesity    RML  pneumonia 02/25/2021   Sleep apnea    wears cpap    Thyroid nodule 01/2009   left discovered on chest ct; needle bx "indeterminant" cannon r/o follicular neoplasm     Tobacco History: Social History   Tobacco Use  Smoking Status Former   Current packs/day: 0.00   Average packs/day: 1 pack/day for 1 year (1.0 ttl pk-yrs)   Types: Cigarettes   Start date: 04/28/1970   Quit date: 04/29/1971   Years since quitting: 52.0   Passive exposure: Past  Smokeless Tobacco Never   Counseling given: Not Answered   Outpatient Medications Prior to Visit  Medication Sig Dispense Refill   acetaminophen (TYLENOL) 650 MG CR tablet Take 1,300 mg by mouth every 8 (eight) hours as needed for pain.     albuterol (VENTOLIN HFA) 108 (90 Base) MCG/ACT inhaler Inhale 2 puffs into the  lungs every 4 (four) hours as needed for wheezing or shortness of breath (coughing fits). 18 g 1   apixaban (ELIQUIS) 5 MG TABS tablet Take 1 tablet (5 mg total) by mouth 2 (two) times daily. 180 tablet 3   benralizumab (FASENRA PEN) 30 MG/ML prefilled autoinjector Inject 1 mL (30 mg total) into the skin every 8 (eight) weeks. 1 mL 6   Budeson-Glycopyrrol-Formoterol (BREZTRI AEROSPHERE) 160-9-4.8 MCG/ACT AERO Inhale 2 puffs into the lungs in the morning and at bedtime. 10.7 g 11   Cholecalciferol 50 MCG (2000 UT) CAPS Take 1 capsule by mouth daily. PATIENT USING OIL AND NOT CAPSULES     diltiazem (CARDIZEM CD) 300 MG 24 hr capsule TAKE 1 CAPSULE BY MOUTH EVERY DAY 90 capsule 3   flecainide (TAMBOCOR) 50 MG tablet TAKE 1 TABLET BY MOUTH TWICE A DAY 180 tablet 2   furosemide (LASIX) 20 MG tablet Take 20-40mg  as directed by cardiology office. 60 tablet 1   hydrochlorothiazide (HYDRODIURIL) 25 MG tablet Take 1 tablet (25 mg total) by mouth daily. 90 tablet 1   ipratropium-albuterol (DUONEB) 0.5-2.5 (3) MG/3ML SOLN Take 3 mLs by nebulization every 6 (six) hours as needed. 360 mL 0   levothyroxine (SYNTHROID) 100 MCG tablet TAKE 1  TABLET BY MOUTH EVERY MORNING ON AN EMPTY STOMACH Orally Once a day 90 days 90 tablet 4   losartan (COZAAR) 100 MG tablet TAKE 1 TABLET BY MOUTH EVERY DAY 90 tablet 1   Lutein 10 MG TABS Take 10 mg by mouth daily.     Magnesium 300 MG CAPS 1 capsule with a meal Orally Once a day     MAGNESIUM CITRATE PO Take 250 mg by mouth.     MULTIPLE VITAMIN PO Take 1 tablet by mouth daily.     Omega-3 Fatty Acids (FISH OIL) 1000 MG CAPS      pantoprazole (PROTONIX) 40 MG tablet TAKE 1 TABLET BY MOUTH EVERY DAY 90 tablet 1   rosuvastatin (CRESTOR) 10 MG tablet TAKE 1 TABLET BY MOUTH EVERY DAY 90 tablet 1   Spacer/Aero-Holding Chambers DEVI Use with spacer 1 each 2   valACYclovir (VALTREX) 500 MG tablet As needed     potassium chloride SA (KLOR-CON M) 10 MEQ tablet Take 1 tablet (10 mEq total) by mouth daily for 5 doses. 5 tablet 0   No facility-administered medications prior to visit.     Review of Systems:   Constitutional: No weight loss or gain, night sweats, fevers, chills, or lassitude. +fatigue (baseline) HEENT: No headaches, difficulty swallowing, tooth/dental problems, or sore throat. No itching, ear ache. +occasional sneezing, nasal congestion (baseline) CV:  +swelling in lower extremities (improved) No PND, orthopnea, chest pain, anasarca, dizziness, palpitations, syncope Resp: +chronic daily cough (baseline); shortness of breath with exertion (improved). No excess mucus or change in color of mucus. No wheezing. No hemoptysis. No chest wall deformity GI:  No heartburn, indigestion, abdominal pain, nausea, vomiting, diarrhea, change in bowel habits, loss of appetite, bloody stools.  GU: No dysuria, frequency, urgency, hematuria  Skin: No rash, lesions, ulcerations Neuro: No dizziness or lightheadedness.  Psych: No depression or anxiety. Mood stable.     Physical Exam:  BP 131/67 (BP Location: Right Arm, Patient Position: Sitting, Cuff Size: Large)   Pulse 86   Temp 98 F (36.7 C)  (Oral)   Ht 5\' 7"  (1.702 m)   Wt 295 lb 12.8 oz (134.2 kg)   SpO2 96%   BMI 46.33 kg/m  GEN: Pleasant, interactive, well-kempt; obese; in no acute distress. HEENT:  Normocephalic and atraumatic. PERRLA. Sclera white. Nasal turbinates pink, moist and patent bilaterally. No rhinorrhea present. Oropharynx pink and moist, without exudate or edema. No lesions, ulcerations  NECK:  Supple w/ fair ROM. No JVD present. Normal carotid impulses w/o bruits. Thyroid symmetrical with no goiter or nodules palpated. No lymphadenopathy.   CV: RRR, no m/r/g, +1 BLE pitting edema. Pulses intact, +2 bilaterally. No cyanosis, pallor or clubbing. PULMONARY:  Unlabored, regular breathing. Clear bilaterally A&P w/o wheezes/rales/rhonchi. No accessory muscle use. No dullness to percussion. GI: BS present and normoactive. Soft, non-tender to palpation. No organomegaly or masses detected.  MSK: No erythema, warmth or tenderness. Cap refil <2 sec all extrem. No deformities or joint swelling noted.  Neuro: A/Ox3. No focal deficits noted.   Skin: Warm, no lesions or rashe Psych: Normal affect and behavior. Judgement and thought content appropriate.     Lab Results:  CBC    Component Value Date/Time   WBC 7.8 04/14/2023 1203   RBC 4.44 04/14/2023 1203   HGB 12.5 04/14/2023 1203   HGB 12.8 01/10/2021 1016   HCT 38.6 04/14/2023 1203   HCT 39.7 01/10/2021 1016   PLT 267.0 04/14/2023 1203   PLT 266 01/10/2021 1016   MCV 86.8 04/14/2023 1203   MCV 84 01/10/2021 1016   MCH 28.3 03/11/2023 1641   MCHC 32.4 04/14/2023 1203   RDW 14.6 04/14/2023 1203   RDW 13.3 01/10/2021 1016   LYMPHSABS 1.0 04/14/2023 1203   MONOABS 0.5 04/14/2023 1203   EOSABS 0.0 04/14/2023 1203   BASOSABS 0.0 04/14/2023 1203    BMET    Component Value Date/Time   NA 138 04/14/2023 1203   NA 141 09/10/2021 0936   K 4.0 04/14/2023 1203   CL 98 04/14/2023 1203   CO2 32 04/14/2023 1203   GLUCOSE 94 04/14/2023 1203   BUN 15 04/14/2023  1203   BUN 12 09/10/2021 0936   CREATININE 0.66 04/14/2023 1203   CALCIUM 9.3 04/14/2023 1203   GFRNONAA >60 03/11/2023 1641   GFRAA 95 05/10/2008 0944    BNP    Component Value Date/Time   BNP 227.2 (H) 03/11/2023 1641     Imaging:  ECHOCARDIOGRAM COMPLETE Result Date: 04/16/2023    ECHOCARDIOGRAM REPORT   Patient Name:   LOELLA HICKLE [facility] Date of Exam: 04/16/2023 Medical Rec #:  [id number]        Height:       67.0 in Accession #:    [id number]       Weight:       302.0 lb Date of Birth:  [birth year]       BSA:          2.409 m Patient Age:    72 years         BP:           140/75 mmHg Patient Gender: F                HR:           69 bpm. Exam Location:  Outpatient Procedure: 2D Echo, 3D Echo, Color Doppler, Cardiac Doppler and Strain Analysis Indications:    Dyspnea  History:        Patient has prior history of Echocardiogram examinations, most                 recent 11/01/2020. Arrythmias:Atrial Fibrillation; Risk  Factors:Hypertension, Dyslipidemia and Former Smoker.  Sonographer:    Jeryl Columbia RDCS Referring Phys: [id number] BRIDGETTE CHRISTOPHER IMPRESSIONS  1. Left ventricular ejection fraction, by estimation, is 60 to 65%. The left ventricle has normal function. The left ventricle has no regional wall motion abnormalities. There is mild asymmetric left ventricular hypertrophy of the inferior segment. Left  ventricular diastolic parameters are indeterminate.  2. Right ventricular systolic function is normal. The right ventricular size is moderately enlarged.  3. Left atrial size was mildly dilated.  4. The mitral valve is normal in structure. Mild mitral valve regurgitation. No evidence of mitral stenosis.  5. The aortic valve is normal in structure. Aortic valve regurgitation is not visualized. No aortic stenosis is present.  6. The inferior vena cava is normal in size with greater than 50% respiratory variability, suggesting right atrial pressure of 3 mmHg. FINDINGS  Left  Ventricle: Left ventricular ejection fraction, by estimation, is 60 to 65%. The left ventricle has normal function. The left ventricle has no regional wall motion abnormalities. Global longitudinal strain performed but not reported based on interpreter judgement due to suboptimal tracking. The left ventricular internal cavity size was normal in size. There is mild asymmetric left ventricular hypertrophy of the inferior segment. Left ventricular diastolic parameters are indeterminate. Right Ventricle: The right ventricular size is moderately enlarged. No increase in right ventricular wall thickness. Right ventricular systolic function is normal. Left Atrium: Left atrial size was mildly dilated. Right Atrium: Right atrial size was normal in size. Pericardium: There is no evidence of pericardial effusion. Presence of epicardial fat layer. Mitral Valve: The mitral valve is normal in structure. Mild mitral valve regurgitation. No evidence of mitral valve stenosis. Tricuspid Valve: The tricuspid valve is normal in structure. Tricuspid valve regurgitation is not demonstrated. No evidence of tricuspid stenosis. Aortic Valve: The aortic valve is normal in structure. Aortic valve regurgitation is not visualized. No aortic stenosis is present. Pulmonic Valve: The pulmonic valve was normal in structure. Pulmonic valve regurgitation is trivial. No evidence of pulmonic stenosis. Aorta: The aortic root is normal in size and structure. Venous: The inferior vena cava is normal in size with greater than 50% respiratory variability, suggesting right atrial pressure of 3 mmHg. IAS/Shunts: No atrial level shunt detected by color flow Doppler.  LEFT VENTRICLE PLAX 2D LVIDd:         4.52 cm   Diastology LVIDs:         2.93 cm   LV e' medial:    7.29 cm/s LV PW:         1.21 cm   LV E/e' medial:  17.6 LV IVS:        0.98 cm   LV e' lateral:   9.68 cm/s LVOT diam:     2.00 cm   LV E/e' lateral: 13.2 LV SV:         53 LV SV Index:   22         2D Longitudinal Strain LVOT Area:     3.14 cm  2D Strain GLS Avg:     -14.6 %  RIGHT VENTRICLE RV Basal diam:  5.13 cm RV Mid diam:    4.55 cm RV S prime:     13.70 cm/s TAPSE (M-mode): 2.3 cm LEFT ATRIUM             Index        RIGHT ATRIUM           Index LA diam:  4.30 cm 1.78 cm/m   RA Area:     14.10 cm LA Vol (A2C):   69.4 ml 28.80 ml/m  RA Volume:   30.10 ml  12.49 ml/m LA Vol (A4C):   96.7 ml 40.14 ml/m LA Biplane Vol: 90.3 ml 37.48 ml/m  AORTIC VALVE LVOT Vmax:   87.50 cm/s LVOT Vmean:  57.000 cm/s LVOT VTI:    0.168 m  AORTA Ao Root diam: 3.00 cm MITRAL VALVE                TRICUSPID VALVE MV Area (PHT): 3.65 cm     TR Peak grad:   19.4 mmHg MV Decel Time: 208 msec     TR Vmax:        220.00 cm/s MV E velocity: 128.00 cm/s MV A velocity: 51.30 cm/s   SHUNTS MV E/A ratio:  2.50         Systemic VTI:  0.17 m                             Systemic Diam: 2.00 cm Lavona Mound Tobb DO Electronically signed by Thomasene Ripple DO Signature Date/Time: 04/16/2023/4:02:27 PM    Final    DG Chest 2 View Result Date: 04/14/2023 CLINICAL DATA:  Hypoxia.  Chest EXAM: CHEST - 2 VIEW COMPARISON:  Radiograph dated 03/11/2023. FINDINGS: Minimal bibasilar atelectasis. No focal consolidation, pleural effusion, or pneumothorax. Mild chronic bronchitic changes. The cardiac silhouette is within normal limits. No acute osseous pathology. IMPRESSION: No active cardiopulmonary disease. Electronically Signed   By: Elgie Collard M.D.   On: 04/14/2023 12:37    Administration History     None          Latest Ref Rng & Units 01/01/2022   10:46 AM  PFT Results  FVC-Pre L 1.90   FVC-Predicted Pre % 54   FVC-Post L 2.06   FVC-Predicted Post % 58   Pre FEV1/FVC % % 59   Post FEV1/FCV % % 65   FEV1-Pre L 1.11   FEV1-Predicted Pre % 41   FEV1-Post L 1.34   DLCO uncorrected ml/min/mmHg 21.21   DLCO UNC% % 95   DLCO corrected ml/min/mmHg 21.84   DLCO COR %Predicted % 98   DLVA Predicted % 149   TLC L 5.02    TLC % Predicted % 88   RV % Predicted % 124     Lab Results  Component Value Date   NITRICOXIDE 17 06/04/2021        Assessment & Plan:   Acute respiratory failure (HCC) Secondary to volume overload. Significant improvement with diuresis. Walk test today with brief desaturation to 88% on room air, recovered to 91% without rest or need for supplemental O2; otherwise, maintained saturations >90%. Advised her to continue to monitor at home. If she remains stable, can d/c at follow up. Will have her complete ONO on CPAP/room air to determine if continued O2 therapy at night warranted. May need titration study if she does have hypoxia. Goal >88-90%  Patient Instructions  Continue Albuterol inhaler 2 puffs or 3 mL neb every 6 hours as needed for shortness of breath or wheezing. Notify if symptoms persist despite rescue inhaler/neb use.  Continue Breztri 2 puffs Twice daily. Brush tongue and rinse mouth afterwards Continue fasenra injections as scheduled Ok to stay off oxygen. Monitor and if above 88-90%, ok to keep O2 off. We will hopefully be able to discontinue this when  you come back but would like to keep an eye on things and make sure you're stable in the meantime.  Continue lasix 20 mg daily and increase to 40 mg daily for weight gain 2-3 lb overnight or 5 lb in a week or worsening swelling. If you can't get the weight back down or swelling to decrease, call your heart doctor or me   Overnight oxygen study off oxygen and on CPAP    Labs today   Continue CPAP nightly   Follow up in 6-8 weeks with Dr. Wynona Neat or Katie Tashena Ibach,NP. If symptoms do not improve or worsen, please contact office for sooner follow up or seek emergency care.    Moderate persistent asthma without complication Stable on current regimen. Did not feel asthma exacerbation was the cause of her hypoxic respiratory failure. She had normal exhaled nitric oxide testing and clear lung exam. She will continue current  regimen. Follow up with asthma/allergy as scheduled.   OSA (obstructive sleep apnea) Excellent compliance and control. Receives benefit from use. Aware of proper care/use. Healthy weight loss encouraged. Aware of safe driving practices.   Volume overload Treated for volume overload with 8 pound weight gain and pitting peripheral edema resulting in hypoxic respiratory failure.  She has had significant improvement with diuresis and fluid management.  Back to dry weight.  Still has some residual bilateral lower extremity swelling, which is baseline for her.  Echocardiogram did show normal EF.  Indeterminate diastolic parameters and evidence of RV strain.  This does concern me for the possibility of underlying pulmonary hypertension or possibly diastolic heart failure given her symptoms and history.  Will continue to monitor her moving forward.  She understands current dosing of Lasix and when to increase based on weight.  Will recheck BMET today.  Could consider right heart catheterization if we have difficulties moving forward.  Follow-up with cardiology as scheduled.             I spent 35 minutes of dedicated to the care of this patient on the date of this encounter to include pre-visit review of records, face-to-face time with the patient discussing conditions above, post visit ordering of testing, clinical documentation with the electronic health record, making appropriate referrals as documented, and communicating necessary findings to members of the patients care team.  Noemi Chapel, NP 04/24/2023  Pt aware and understands NP's role.

## 2023-04-24 NOTE — Assessment & Plan Note (Signed)
Excellent compliance and control. Receives benefit from use. Aware of proper care/use. Healthy weight loss encouraged. Aware of safe driving practices.

## 2023-04-24 NOTE — Assessment & Plan Note (Signed)
Stable on current regimen. Did not feel asthma exacerbation was the cause of her hypoxic respiratory failure. She had normal exhaled nitric oxide testing and clear lung exam. She will continue current regimen. Follow up with asthma/allergy as scheduled.

## 2023-04-24 NOTE — Assessment & Plan Note (Addendum)
Treated for volume overload with 8 pound weight gain and pitting peripheral edema resulting in hypoxic respiratory failure.  She has had significant improvement with diuresis and fluid management.  Back to dry weight.  Still has some residual bilateral lower extremity swelling, which is baseline for her.  Echocardiogram did show normal EF.  Indeterminate diastolic parameters and evidence of RV strain.  This does concern me for the possibility of underlying pulmonary hypertension or possibly diastolic heart failure given her symptoms and history.  Will continue to monitor her moving forward.  She understands current dosing of Lasix and when to increase based on weight.  Will recheck BMET today.  Could consider right heart catheterization if we have difficulties moving forward.  Follow-up with cardiology as scheduled.

## 2023-04-27 DIAGNOSIS — N958 Other specified menopausal and perimenopausal disorders: Secondary | ICD-10-CM | POA: Diagnosis not present

## 2023-04-27 DIAGNOSIS — I480 Paroxysmal atrial fibrillation: Secondary | ICD-10-CM | POA: Diagnosis not present

## 2023-04-27 LAB — HM DEXA SCAN: HM Dexa Scan: NORMAL

## 2023-04-28 ENCOUNTER — Encounter (HOSPITAL_BASED_OUTPATIENT_CLINIC_OR_DEPARTMENT_OTHER): Payer: Medicare HMO

## 2023-04-30 ENCOUNTER — Ambulatory Visit (HOSPITAL_BASED_OUTPATIENT_CLINIC_OR_DEPARTMENT_OTHER): Payer: Medicare HMO | Admitting: Family Medicine

## 2023-04-30 ENCOUNTER — Encounter (HOSPITAL_BASED_OUTPATIENT_CLINIC_OR_DEPARTMENT_OTHER): Payer: Self-pay | Admitting: Family Medicine

## 2023-04-30 ENCOUNTER — Ambulatory Visit (HOSPITAL_BASED_OUTPATIENT_CLINIC_OR_DEPARTMENT_OTHER): Payer: Medicare HMO

## 2023-04-30 ENCOUNTER — Ambulatory Visit (HOSPITAL_BASED_OUTPATIENT_CLINIC_OR_DEPARTMENT_OTHER): Payer: Self-pay | Admitting: Family Medicine

## 2023-04-30 VITALS — BP 135/76 | HR 74 | Temp 99.0°F | Ht 67.0 in | Wt 294.4 lb

## 2023-04-30 DIAGNOSIS — R051 Acute cough: Secondary | ICD-10-CM

## 2023-04-30 DIAGNOSIS — J45909 Unspecified asthma, uncomplicated: Secondary | ICD-10-CM | POA: Diagnosis not present

## 2023-04-30 DIAGNOSIS — R059 Cough, unspecified: Secondary | ICD-10-CM | POA: Diagnosis not present

## 2023-04-30 DIAGNOSIS — R9389 Abnormal findings on diagnostic imaging of other specified body structures: Secondary | ICD-10-CM | POA: Diagnosis not present

## 2023-04-30 DIAGNOSIS — J029 Acute pharyngitis, unspecified: Secondary | ICD-10-CM | POA: Diagnosis not present

## 2023-04-30 LAB — POCT INFLUENZA A/B
Influenza A, POC: NEGATIVE
Influenza B, POC: NEGATIVE

## 2023-04-30 LAB — POC COVID19 BINAXNOW: SARS Coronavirus 2 Ag: NEGATIVE

## 2023-04-30 NOTE — Telephone Encounter (Signed)
Spoke to [place] with [employer]. This issue has been resolved and the tank has been picked up. Nothing further needed.

## 2023-04-30 NOTE — Assessment & Plan Note (Addendum)
About 2 days ago, patient noted onset of sore throat, cough, chest congestion.  She reports that Monday she was doing fine and gone to the gym and had no issues then, but Tuesday began to notice symptoms.  She has had some associated shortness of breath with exertion.  She has not noted increased oxygen demands overnight.  She is not aware of any sick contacts.  She did complete at home COVID test when symptoms started on Tuesday, this was negative.  She has not had any fever, chills, sweats, body aches. On exam, patient is in no acute distress, vital signs stable, she is afebrile.  Cardiovascular exam with normal rate, irregularly irregular rhythm.  Lungs with intermittent expiratory wheezes.  Mild pharyngeal erythema, no significant cervical lymphadenopathy. Suspect viral etiology for observed symptoms.  We can proceed with flu and COVID swabs in office today.  Swabs completed today were both negative If swabs are negative, likely proceed with chest x-ray for further evaluation.  As above, swabs negative, we will proceed with a chest x-ray for further evaluation Discussed precautions related to acute illness.  Primary concern is related to change in respiratory status.  If noticing any worsening shortness of breath, trouble breathing, would recommend further evaluation in the emergency department. Care is complicated by history of asthma, sleep apnea, history of smoking, obesity.

## 2023-04-30 NOTE — Telephone Encounter (Signed)
Copied from CRM [id number]. Topic: Clinical - Medication Question >> Apr 30, 2023  9:33 AM Carlatta H wrote: Reason for CRM: Patient would like a call back to see if she could dissolve medication in water to take//Patient has a sore throat//   Chief Complaint: Medication Question  Additional Notes: Patient asking she can dissolve her Klor-Con 10. RN advised that the medication can be dissolved in 4oz of water and should be taken in its entirety. Pt verbalized underdstanidng.    Reason for Disposition  Caller has medicine question only, adult not sick, AND triager answers question  Answer Assessment - Initial Assessment Questions 1. NAME of MEDICINE: "What medicine(s) are you calling about?"     Klor-Con  M10 mEq  2. QUESTION: "What is your question?" (e.g., double dose of medicine, side effect)     "Is it okay to dissolve  3. PRESCRIBER: "Who prescribed the medicine?" Reason: if prescribed by specialist, call should be referred to that group.     [place]  4. SYMPTOMS: "Do you have any symptoms?" If Yes, ask: "What symptoms are you having?"  "How bad are the symptoms (e.g., mild, moderate, severe)     No symptoms  5. PREGNANCY:  "Is there any chance that you are pregnant?" "When was your last menstrual period?"     No  Protocols used: Medication Question Call-A-AH

## 2023-04-30 NOTE — Telephone Encounter (Signed)
noted

## 2023-04-30 NOTE — Progress Notes (Signed)
    Procedures performed today:    None.  Independent interpretation of notes and tests performed by another provider:   None.  Brief History, Exam, Impression, and Recommendations:    BP 135/76 (BP Location: Right Arm, Patient Position: Sitting, Cuff Size: Large)   Pulse 74   Temp 99 F (37.2 C) (Oral)   Ht 5\' 7"  (1.702 m)   Wt 294 lb 6.4 oz (133.5 kg)   SpO2 94%   BMI 46.11 kg/m   Acute cough Assessment & Plan: About 2 days ago, patient noted onset of sore throat, cough, chest congestion.  She reports that Monday she was doing fine and gone to the gym and had no issues then, but Tuesday began to notice symptoms.  She has had some associated shortness of breath with exertion.  She has not noted increased oxygen demands overnight.  She is not aware of any sick contacts.  She did complete at home COVID test when symptoms started on Tuesday, this was negative.  She has not had any fever, chills, sweats, body aches. On exam, patient is in no acute distress, vital signs stable, she is afebrile.  Cardiovascular exam with normal rate, irregularly irregular rhythm.  Lungs with intermittent expiratory wheezes.  Mild pharyngeal erythema, no significant cervical lymphadenopathy. Suspect viral etiology for observed symptoms.  We can proceed with flu and COVID swabs in office today.  Swabs completed today were both negative If swabs are negative, likely proceed with chest x-ray for further evaluation.  As above, swabs negative, we will proceed with a chest x-ray for further evaluation Discussed precautions related to acute illness.  Primary concern is related to change in respiratory status.  If noticing any worsening shortness of breath, trouble breathing, would recommend further evaluation in the emergency department. Care is complicated by history of asthma, sleep apnea, history of smoking, obesity.  Orders: -     POCT Influenza A/B -     POC COVID-19 BinaxNow -     DG Chest 2 View;  Future   ___________________________________________ Taiwan Millon [place], MD, ABFM, CAQSM Primary Care and Sports Medicine [facility]

## 2023-05-01 ENCOUNTER — Other Ambulatory Visit (HOSPITAL_BASED_OUTPATIENT_CLINIC_OR_DEPARTMENT_OTHER): Payer: Self-pay | Admitting: Family

## 2023-05-01 DIAGNOSIS — R600 Localized edema: Secondary | ICD-10-CM

## 2023-05-02 ENCOUNTER — Other Ambulatory Visit: Payer: Self-pay

## 2023-05-02 ENCOUNTER — Emergency Department (HOSPITAL_BASED_OUTPATIENT_CLINIC_OR_DEPARTMENT_OTHER)
Admission: EM | Admit: 2023-05-02 | Discharge: 2023-05-02 | Disposition: A | Discharge status: Home / Self Care | Payer: Medicare HMO | Attending: Emergency Medicine | Admitting: Emergency Medicine

## 2023-05-02 ENCOUNTER — Emergency Department (HOSPITAL_BASED_OUTPATIENT_CLINIC_OR_DEPARTMENT_OTHER): Payer: Medicare HMO | Admitting: Radiology

## 2023-05-02 DIAGNOSIS — J069 Acute upper respiratory infection, unspecified: Secondary | ICD-10-CM

## 2023-05-02 DIAGNOSIS — Z7951 Long term (current) use of inhaled steroids: Secondary | ICD-10-CM | POA: Diagnosis not present

## 2023-05-02 DIAGNOSIS — J45909 Unspecified asthma, uncomplicated: Secondary | ICD-10-CM | POA: Diagnosis not present

## 2023-05-02 DIAGNOSIS — R058 Other specified cough: Secondary | ICD-10-CM | POA: Diagnosis not present

## 2023-05-02 DIAGNOSIS — Z20822 Contact with and (suspected) exposure to covid-19: Secondary | ICD-10-CM | POA: Diagnosis not present

## 2023-05-02 DIAGNOSIS — Z7901 Long term (current) use of anticoagulants: Secondary | ICD-10-CM | POA: Insufficient documentation

## 2023-05-02 DIAGNOSIS — J984 Other disorders of lung: Secondary | ICD-10-CM | POA: Diagnosis not present

## 2023-05-02 DIAGNOSIS — B9789 Other viral agents as the cause of diseases classified elsewhere: Secondary | ICD-10-CM | POA: Diagnosis not present

## 2023-05-02 DIAGNOSIS — R059 Cough, unspecified: Secondary | ICD-10-CM | POA: Diagnosis present

## 2023-05-02 DIAGNOSIS — I10 Essential (primary) hypertension: Secondary | ICD-10-CM | POA: Diagnosis not present

## 2023-05-02 LAB — BASIC METABOLIC PANEL
Anion gap: 5 (ref 5–15)
BUN: 13 mg/dL (ref 8–23)
CO2: 33 mmol/L — ABNORMAL HIGH (ref 22–32)
Calcium: 8.9 mg/dL (ref 8.9–10.3)
Chloride: 102 mmol/L (ref 98–111)
Creatinine, Ser: 0.54 mg/dL (ref 0.44–1.00)
GFR, Estimated: >60 mL/min (ref >60)
Glucose, Bld: 105 mg/dL — ABNORMAL HIGH (ref 70–99)
Potassium: 3.9 mmol/L (ref 3.5–5.1)
Sodium: 140 mmol/L (ref 135–145)

## 2023-05-02 LAB — CBC WITH DIFFERENTIAL/PLATELET
Abs Immature Granulocytes: 0.01 10*3/uL (ref 0.00–0.07)
Basophils Absolute: 0 10*3/uL (ref 0.0–0.1)
Basophils Relative: 0 %
Eosinophils Absolute: 0 10*3/uL (ref 0.0–0.5)
Eosinophils Relative: 0 %
HCT: 37.1 % (ref 36.0–46.0)
Hemoglobin: 11.4 g/dL — ABNORMAL LOW (ref 12.0–15.0)
Immature Granulocytes: 0 %
Lymphocytes Relative: 15 %
Lymphs Abs: 1.2 10*3/uL (ref 0.7–4.0)
MCH: 27.2 pg (ref 26.0–34.0)
MCHC: 30.7 g/dL (ref 30.0–36.0)
MCV: 88.5 fL (ref 80.0–100.0)
Monocytes Absolute: 0.7 10*3/uL (ref 0.1–1.0)
Monocytes Relative: 10 %
Neutro Abs: 5.7 10*3/uL (ref 1.7–7.7)
Neutrophils Relative %: 75 %
Platelets: 233 10*3/uL (ref 150–400)
RBC: 4.19 MIL/uL (ref 3.87–5.11)
RDW: 14.5 % (ref 11.5–15.5)
WBC: 7.6 10*3/uL (ref 4.0–10.5)
nRBC: 0 % (ref 0.0–0.2)

## 2023-05-02 LAB — RESP PANEL BY RT-PCR (RSV, FLU A&B, COVID)  RVPGX2
Influenza A by PCR: NEGATIVE
Influenza B by PCR: NEGATIVE
Resp Syncytial Virus by PCR: NEGATIVE
SARS Coronavirus 2 by RT PCR: NEGATIVE

## 2023-05-02 MED ORDER — IBUPROFEN 800 MG PO TABS
800.0000 mg | ORAL_TABLET | Freq: Once | ORAL | Status: DC
  Filled 2023-05-02: qty 1

## 2023-05-02 MED ORDER — PREDNISONE 20 MG PO TABS: ORAL_TABLET | ORAL | 0 refills | Status: DC

## 2023-05-02 MED ORDER — GUAIFENESIN 100 MG/5ML PO LIQD
5.0000 mL | Freq: Once | ORAL | Status: AC
  Administered 2023-05-02: 5 mL via ORAL
  Filled 2023-05-02: qty 10

## 2023-05-02 MED ORDER — BENZONATATE 100 MG PO CAPS
200.0000 mg | ORAL_CAPSULE | Freq: Once | ORAL | Status: AC
  Administered 2023-05-02: 200 mg via ORAL
  Filled 2023-05-02: qty 2

## 2023-05-02 MED ORDER — ACETAMINOPHEN 500 MG PO TABS
1000.0000 mg | ORAL_TABLET | Freq: Once | ORAL | Status: AC
  Administered 2023-05-02: 1000 mg via ORAL
  Filled 2023-05-02: qty 2

## 2023-05-02 MED ORDER — NYSTATIN 100000 UNIT/ML MT SUSP: 5.0000 mL | Freq: Three times a day (TID) | OROMUCOSAL | 0 refills | Status: DC | PRN

## 2023-05-02 MED ORDER — BENZONATATE 100 MG PO CAPS: 100.0000 mg | ORAL_CAPSULE | Freq: Three times a day (TID) | ORAL | 0 refills | Status: DC

## 2023-05-02 NOTE — Discharge Instructions (Signed)
Mucinex twice daily as a decongestant

## 2023-05-02 NOTE — ED Provider Notes (Signed)
[place] EMERGENCY DEPARTMENT AT [facility] Provider Note   CSN: [id number] Arrival date & time: 05/02/23  0445     History  Chief Complaint  Patient presents with   Cough    Amy Stokes is a 73 y.o. female.   URI Presenting symptoms: congestion, cough and sore throat   Presenting symptoms: no fever   Severity:  Moderate Onset quality:  Gradual Duration:  3 days Timing:  Constant Progression:  Unchanged Chronicity:  New Relieved by:  Nothing Worsened by:  Nothing Ineffective treatments:  None tried Associated symptoms: no wheezing   Risk factors: no chronic kidney disease   Patient with asthma and HTN on 3L home o2 presents with sore throat, cough and congestion for 3 days.  Seen 2 days ago with negative testing but still has symptoms.      Past Medical History:  Diagnosis Date   Arthritis    no meds   Asthma    Edema    Fibroids    Hyperlipidemia    Hypertension    Obesity    RML pneumonia 02/25/2021   Sleep apnea    wears cpap    Thyroid nodule 01/2009   left discovered on chest ct; needle bx "indeterminant" cannon r/o follicular neoplasm      Home Medications Prior to Admission medications   Medication Sig Start Date End Date Taking? Authorizing Provider  acetaminophen (TYLENOL) 650 MG CR tablet Take 1,300 mg by mouth every 8 (eight) hours as needed for pain.    [provider]  albuterol (VENTOLIN HFA) 108 (90 Base) MCG/ACT inhaler Inhale 2 puffs into the lungs every 4 (four) hours as needed for wheezing or shortness of breath (coughing fits). 08/13/22   Ellamae Sia, DO  apixaban (ELIQUIS) 5 MG TABS tablet Take 1 tablet (5 mg total) by mouth 2 (two) times daily. 11/26/22   Jodelle Red, MD  benralizumab [facility] PEN) 30 MG/ML prefilled autoinjector Inject 1 mL (30 mg total) into the skin every 8 (eight) weeks. 04/02/23   Ellamae Sia, DO  Budeson-Glycopyrrol-Formoterol (BREZTRI AEROSPHERE) 160-9-4.8 MCG/ACT AERO Inhale 2  puffs into the lungs in the morning and at bedtime. 03/06/23   Ellamae Sia, DO  Cholecalciferol 50 MCG (2000 UT) CAPS Take 1 capsule by mouth daily. PATIENT USING OIL AND NOT CAPSULES    [provider]  diltiazem (CARDIZEM CD) 300 MG 24 hr capsule TAKE 1 CAPSULE BY MOUTH EVERY DAY 11/05/22   Jodelle Red, MD  flecainide (TAMBOCOR) 50 MG tablet TAKE 1 TABLET BY MOUTH TWICE A DAY 02/02/23   Jodelle Red, MD  FLUZONE HIGH-DOSE 0.5 ML injection  03/04/23   [provider]  furosemide (LASIX) 20 MG tablet TAKE 20-40MG  AS DIRECTED BY CARDIOLOGY OFFICE. 05/01/23   Alver Sorrow, NP  hydrochlorothiazide (HYDRODIURIL) 25 MG tablet Take 1 tablet (25 mg total) by mouth daily. 02/04/23   Panosh, Neta Mends, MD  ipratropium-albuterol (DUONEB) 0.5-2.5 (3) MG/3ML SOLN Take 3 mLs by nebulization every 6 (six) hours as needed. 02/28/21   Rolly Salter, MD  levothyroxine (SYNTHROID) 100 MCG tablet TAKE 1 TABLET BY MOUTH EVERY MORNING ON AN EMPTY STOMACH Orally Once a day 90 days 04/16/21     losartan (COZAAR) 100 MG tablet TAKE 1 TABLET BY MOUTH EVERY DAY 01/30/23   Worthy Rancher B, FNP  Lutein 10 MG TABS Take 10 mg by mouth daily.    [provider]  Magnesium 300 MG CAPS 1  capsule with a meal Orally Once a day    [provider]  MAGNESIUM CITRATE PO Take 250 mg by mouth.    [provider]  MULTIPLE VITAMIN PO Take 1 tablet by mouth daily.    [provider]  Omega-3 Fatty Acids (FISH OIL) 1000 MG CAPS  11/03/22   [provider]  pantoprazole (PROTONIX) 40 MG tablet TAKE 1 TABLET BY MOUTH EVERY DAY 11/27/22   Olalere, Adewale A, MD  potassium chloride SA (KLOR-CON M) 10 MEQ tablet Take 1 tablet (10 mEq total) by mouth daily for 5 doses. 04/15/23 04/20/23  Cobb, Ruby Cola, NP  rosuvastatin (CRESTOR) 10 MG tablet TAKE 1 TABLET BY MOUTH EVERY DAY 01/21/23   Alver Sorrow, NP  Spacer/Aero-Holding [facility] Use with spacer 06/04/21    Cobb, Ruby Cola, NP  valACYclovir (VALTREX) 500 MG tablet As needed 04/08/21   [provider]      Allergies    Patient has no known allergies.    Review of Systems   Review of Systems  Constitutional:  Negative for fever.  HENT:  Positive for congestion and sore throat.   Respiratory:  Positive for cough. Negative for wheezing.     Physical Exam Updated Vital Signs BP (!) 125/54   Pulse 81   Temp 98.3 F (36.8 C) (Oral)   Resp 20   Ht 5\' 7"  (1.702 m)   Wt 131.5 kg   SpO2 100%   BMI 45.42 kg/m  Physical Exam Vitals and nursing note reviewed. Exam conducted with a chaperone present.  Constitutional:      General: She is not in acute distress.    Appearance: Normal appearance. She is well-developed.  HENT:     Head: Normocephalic and atraumatic.     Nose: Nose normal.     Mouth/Throat:     Mouth: Mucous membranes are moist.     Pharynx: Oropharynx is clear. No oropharyngeal exudate or posterior oropharyngeal erythema.  Eyes:     Pupils: Pupils are equal, round, and reactive to light.  Cardiovascular:     Rate and Rhythm: Normal rate and regular rhythm.     Pulses: Normal pulses.     Heart sounds: Normal heart sounds.  Pulmonary:     Effort: Pulmonary effort is normal. No respiratory distress.     Breath sounds: Normal breath sounds. No stridor. No wheezing, rhonchi or rales.  Abdominal:     General: Bowel sounds are normal. There is no distension.     Palpations: Abdomen is soft.     Tenderness: There is no abdominal tenderness. There is no guarding or rebound.  Musculoskeletal:        General: Normal range of motion.     Cervical back: Neck supple.  Skin:    General: Skin is dry.     Capillary Refill: Capillary refill takes less than 2 seconds.     Findings: No erythema or rash.  Neurological:     General: No focal deficit present.     Mental Status: She is alert.     Deep Tendon Reflexes: Reflexes normal.  Psychiatric:        Mood and Affect:  Mood normal.     ED Results / Procedures / Treatments   Labs (all labs ordered are listed, but only abnormal results are displayed) Results for orders placed or performed during the hospital encounter of 05/02/23  Resp panel by RT-PCR (RSV, Flu A&B, Covid) Anterior Nasal Swab  Collection Time: 05/02/23  5:09 AM   Specimen: Anterior Nasal Swab  Result Value Ref Range   SARS Coronavirus 2 by RT PCR NEGATIVE NEGATIVE   Influenza A by PCR NEGATIVE NEGATIVE   Influenza B by PCR NEGATIVE NEGATIVE   Resp Syncytial Virus by PCR NEGATIVE NEGATIVE  CBC with Differential   Collection Time: 05/02/23  5:09 AM  Result Value Ref Range   WBC 7.6 4.0 - 10.5 K/uL   RBC 4.19 3.87 - 5.11 MIL/uL   Hemoglobin 11.4 (L) 12.0 - 15.0 g/dL   HCT 40.9 81.1 - 91.4 %   MCV 88.5 80.0 - 100.0 fL   MCH 27.2 26.0 - 34.0 pg   MCHC 30.7 30.0 - 36.0 g/dL   RDW 78.2 95.6 - 21.3 %   Platelets 233 150 - 400 K/uL   nRBC 0.0 0.0 - 0.2 %   Neutrophils Relative % 75 %   Neutro Abs 5.7 1.7 - 7.7 K/uL   Lymphocytes Relative 15 %   Lymphs Abs 1.2 0.7 - 4.0 K/uL   Monocytes Relative 10 %   Monocytes Absolute 0.7 0.1 - 1.0 K/uL   Eosinophils Relative 0 %   Eosinophils Absolute 0.0 0.0 - 0.5 K/uL   Basophils Relative 0 %   Basophils Absolute 0.0 0.0 - 0.1 K/uL   Immature Granulocytes 0 %   Abs Immature Granulocytes 0.01 0.00 - 0.07 K/uL  Basic metabolic panel   Collection Time: 05/02/23  5:09 AM  Result Value Ref Range   Sodium 140 135 - 145 mmol/L   Potassium 3.9 3.5 - 5.1 mmol/L   Chloride 102 98 - 111 mmol/L   CO2 33 (H) 22 - 32 mmol/L   Glucose, Bld 105 (H) 70 - 99 mg/dL   BUN 13 8 - 23 mg/dL   Creatinine, Ser 0.86 0.44 - 1.00 mg/dL   Calcium 8.9 8.9 - 57.8 mg/dL   GFR, Estimated >46 >96 mL/min   Anion gap 5 5 - 15   DG Chest 2 View Result Date: 05/02/2023 CLINICAL DATA:  73 year old female with history of productive cough and body aches. EXAM: CHEST - 2 VIEW COMPARISON:  Chest x-ray 04/30/2023. FINDINGS:  Linear opacities in the lung bases bilaterally most compatible with areas of mild chronic scarring, similar to the prior examination. Lung volumes are normal. No consolidative airspace disease. No pleural effusions. No pneumothorax. No pulmonary nodule or mass noted. Pulmonary vasculature and the cardiomediastinal silhouette are within normal limits. IMPRESSION: 1. No radiographic evidence of acute cardiopulmonary disease. 2. Mild bibasilar scarring, similar to the prior study. Electronically Signed   By: Trudie Reed M.D.   On: 05/02/2023 06:03   ECHOCARDIOGRAM COMPLETE Result Date: 04/16/2023    ECHOCARDIOGRAM REPORT   Patient Name:   RADHIKA DERSHEM [facility] Date of Exam: 04/16/2023 Medical Rec #:  [id number]        Height:       67.0 in Accession #:    [id number]       Weight:       302.0 lb Date of Birth:  07-18-1950       BSA:          2.409 m Patient Age:    72 years         BP:           140/75 mmHg Patient Gender: F                HR:  69 bpm. Exam Location:  Outpatient Procedure: 2D Echo, 3D Echo, Color Doppler, Cardiac Doppler and Strain Analysis Indications:    Dyspnea  History:        Patient has prior history of Echocardiogram examinations, most                 recent 11/01/2020. Arrythmias:Atrial Fibrillation; Risk                 Factors:Hypertension, Dyslipidemia and Former Smoker.  Sonographer:    Jeryl Columbia RDCS Referring Phys: [id number] BRIDGETTE CHRISTOPHER IMPRESSIONS  1. Left ventricular ejection fraction, by estimation, is 60 to 65%. The left ventricle has normal function. The left ventricle has no regional wall motion abnormalities. There is mild asymmetric left ventricular hypertrophy of the inferior segment. Left  ventricular diastolic parameters are indeterminate.  2. Right ventricular systolic function is normal. The right ventricular size is moderately enlarged.  3. Left atrial size was mildly dilated.  4. The mitral valve is normal in structure. Mild mitral valve regurgitation.  No evidence of mitral stenosis.  5. The aortic valve is normal in structure. Aortic valve regurgitation is not visualized. No aortic stenosis is present.  6. The inferior vena cava is normal in size with greater than 50% respiratory variability, suggesting right atrial pressure of 3 mmHg. FINDINGS  Left Ventricle: Left ventricular ejection fraction, by estimation, is 60 to 65%. The left ventricle has normal function. The left ventricle has no regional wall motion abnormalities. Global longitudinal strain performed but not reported based on interpreter judgement due to suboptimal tracking. The left ventricular internal cavity size was normal in size. There is mild asymmetric left ventricular hypertrophy of the inferior segment. Left ventricular diastolic parameters are indeterminate. Right Ventricle: The right ventricular size is moderately enlarged. No increase in right ventricular wall thickness. Right ventricular systolic function is normal. Left Atrium: Left atrial size was mildly dilated. Right Atrium: Right atrial size was normal in size. Pericardium: There is no evidence of pericardial effusion. Presence of epicardial fat layer. Mitral Valve: The mitral valve is normal in structure. Mild mitral valve regurgitation. No evidence of mitral valve stenosis. Tricuspid Valve: The tricuspid valve is normal in structure. Tricuspid valve regurgitation is not demonstrated. No evidence of tricuspid stenosis. Aortic Valve: The aortic valve is normal in structure. Aortic valve regurgitation is not visualized. No aortic stenosis is present. Pulmonic Valve: The pulmonic valve was normal in structure. Pulmonic valve regurgitation is trivial. No evidence of pulmonic stenosis. Aorta: The aortic root is normal in size and structure. Venous: The inferior vena cava is normal in size with greater than 50% respiratory variability, suggesting right atrial pressure of 3 mmHg. IAS/Shunts: No atrial level shunt detected by color flow  Doppler.  LEFT VENTRICLE PLAX 2D LVIDd:         4.52 cm   Diastology LVIDs:         2.93 cm   LV e' medial:    7.29 cm/s LV PW:         1.21 cm   LV E/e' medial:  17.6 LV IVS:        0.98 cm   LV e' lateral:   9.68 cm/s LVOT diam:     2.00 cm   LV E/e' lateral: 13.2 LV SV:         53 LV SV Index:   22        2D Longitudinal Strain LVOT Area:     3.14 cm  2D  Strain GLS Avg:     -14.6 %  RIGHT VENTRICLE RV Basal diam:  5.13 cm RV Mid diam:    4.55 cm RV S prime:     13.70 cm/s TAPSE (M-mode): 2.3 cm LEFT ATRIUM             Index        RIGHT ATRIUM           Index LA diam:        4.30 cm 1.78 cm/m   RA Area:     14.10 cm LA Vol (A2C):   69.4 ml 28.80 ml/m  RA Volume:   30.10 ml  12.49 ml/m LA Vol (A4C):   96.7 ml 40.14 ml/m LA Biplane Vol: 90.3 ml 37.48 ml/m  AORTIC VALVE LVOT Vmax:   87.50 cm/s LVOT Vmean:  57.000 cm/s LVOT VTI:    0.168 m  AORTA Ao Root diam: 3.00 cm MITRAL VALVE                TRICUSPID VALVE MV Area (PHT): 3.65 cm     TR Peak grad:   19.4 mmHg MV Decel Time: 208 msec     TR Vmax:        220.00 cm/s MV E velocity: 128.00 cm/s MV A velocity: 51.30 cm/s   SHUNTS MV E/A ratio:  2.50         Systemic VTI:  0.17 m                             Systemic Diam: 2.00 cm Kardie Tobb DO Electronically signed by Thomasene Ripple DO Signature Date/Time: 04/16/2023/4:02:27 PM    Final    DG Chest 2 View Result Date: 04/14/2023 CLINICAL DATA:  Hypoxia.  Chest EXAM: CHEST - 2 VIEW COMPARISON:  Radiograph dated 03/11/2023. FINDINGS: Minimal bibasilar atelectasis. No focal consolidation, pleural effusion, or pneumothorax. Mild chronic bronchitic changes. The cardiac silhouette is within normal limits. No acute osseous pathology. IMPRESSION: No active cardiopulmonary disease. Electronically Signed   By: Elgie Collard M.D.   On: 04/14/2023 12:37     Radiology No results found.  Procedures Procedures    Medications Ordered in ED Medications  ibuprofen (ADVIL) tablet 800 mg (has no administration in  time range)  guaiFENesin (ROBITUSSIN) 100 MG/5ML liquid 5 mL (5 mLs Oral Given 05/02/23 0506)  acetaminophen (TYLENOL) tablet 1,000 mg (1,000 mg Oral Given 05/02/23 0506)  benzonatate (TESSALON) capsule 200 mg (200 mg Oral Given 05/02/23 0506)    ED Course/ Medical Decision Making/ A&P                                 Medical Decision Making Patient with URI symptoms x 3 days   Amount and/or Complexity of Data Reviewed External Data Reviewed: labs and notes.    Details: Previous notes reviewed along with labs  Labs: ordered.    Details: Negative covid and flu. Normal white count 7.6, hemoglobin slight low 11.4, normal platelets.  Normal sodium 140, normal potassium 3.9 Radiology: ordered and independent interpretation performed.    Details: Normal CXR  Risk OTC drugs. Prescription drug management.    Final Clinical Impression(s) / ED Diagnoses Final diagnoses:  Viral URI with cough     I have reviewed the triage vital signs and the nursing notes. Pertinent labs & imaging results that were available during my care  of the patient were reviewed by me and considered in my medical decision making (see chart for details). After history, exam, and medical workup I feel the patient has been appropriately medically screened and is safe for discharge home. Pertinent diagnoses were discussed with the patient. Patient was given return precautions.  Rx / DC Orders ED Discharge Orders     None         Bryona Foxworthy, MD 05/02/23 [phone]

## 2023-05-02 NOTE — ED Triage Notes (Signed)
Pt POV with husband d/t flu-like s/s - productive cough, body aches, sore throat, congestion.  Pt seen 2 days ago at PCP and was negative for Covid,FLU, RSV.

## 2023-05-04 NOTE — Telephone Encounter (Signed)
 Duplicate

## 2023-05-05 ENCOUNTER — Telehealth: Payer: Self-pay

## 2023-05-05 NOTE — Transitions of Care (Post Inpatient/ED Visit) (Signed)
 05/05/2023  Name: MAUDEAN HOFFMANN MRN: [id number] DOB: 1950/07/18  Today's TOC FU Call Status: Today's TOC FU Call Status:: Successful TOC FU Call Completed Unsuccessful Call (1st Attempt) Date: 05/05/23 [facility] FU Call Complete Date: 05/05/23 Patient's Name and Date of Birth confirmed.  Transition Care Management Follow-up Telephone Call Date of Discharge: 05/02/23 Discharge Facility: [facility] ([facility]) Type of Discharge: Emergency Department Reason for ED Visit:  (URI) How have you been since you were released from the hospital?: Same (slowly getting better)  Items Reviewed: Did you receive and understand the discharge instructions provided?: Yes Medications obtained,verified, and reconciled?: Yes (Medications Reviewed) Any new allergies since your discharge?: No Dietary orders reviewed?: NA Do you have support at home?: Yes  Medications Reviewed Today: Medications Reviewed Today     Reviewed by Sylina Henion, [place] LABOR, CMA (Certified Medical Assistant) on 05/05/23 at 1541  Med List Status: <None>   Medication Order Taking? Sig Documenting Provider Last Dose Status Informant  acetaminophen  (TYLENOL ) 650 MG CR tablet [id number] No Take 1,300 mg by mouth every 8 (eight) hours as needed for pain. [provider] Taking Active Self  albuterol  (VENTOLIN  HFA) 108 (90 Base) MCG/ACT inhaler [id number] No Inhale 2 puffs into the lungs every 4 (four) hours as needed for wheezing or shortness of breath (coughing fits). Luke Orlan HERO, DO Taking Active   apixaban  (ELIQUIS ) 5 MG TABS tablet [id number] No Take 1 tablet (5 mg total) by mouth 2 (two) times daily. Lonni Slain, MD Taking Active   benralizumab  (FASENRA  PEN) 30 MG/ML prefilled autoinjector [id number] No Inject 1 mL (30 mg total) into the skin every 8 (eight) weeks. Luke Orlan HERO, DO Taking Active   Budeson-Glycopyrrol-Formoterol  (BREZTRI  AEROSPHERE) 160-9-4.8 MCG/ACT AERO [id number] No Inhale 2 puffs into the lungs in the  morning and at bedtime. Luke Orlan HERO, DO Taking Active   Cholecalciferol 50 MCG (2000 UT) CAPS [id number] No Take 1 capsule by mouth daily. PATIENT USING OIL AND NOT CAPSULES [provider] Taking Active Self  diltiazem  (CARDIZEM  CD) 300 MG 24 hr capsule [id number] No TAKE 1 CAPSULE BY MOUTH EVERY DAY Lonni Slain, MD Taking Active   flecainide  (TAMBOCOR ) 50 MG tablet [id number] No TAKE 1 TABLET BY MOUTH TWICE A DAY Christopher, Bridgette, MD Taking Active   FLUZONE HIGH-DOSE 0.5 ML injection [id number]   [provider]  Active   furosemide  (LASIX ) 20 MG tablet [id number]  TAKE 20-40MG  AS DIRECTED BY CARDIOLOGY OFFICE. Vannie Reche RAMAN, NP  Active   hydrochlorothiazide  (HYDRODIURIL ) 25 MG tablet [id number] No Take 1 tablet (25 mg total) by mouth daily. Panosh, Apolinar POUR, MD Taking Active   ipratropium-albuterol  (DUONEB) 0.5-2.5 (3) MG/3ML SOLN [id number] No Take 3 mLs by nebulization every 6 (six) hours as needed. Tobie Yetta HERO, MD Taking Active   levothyroxine  (SYNTHROID ) 100 MCG tablet [id number] No TAKE 1 TABLET BY MOUTH EVERY MORNING ON AN EMPTY STOMACH Orally Once a day 90 days  Taking Active   losartan  (COZAAR ) 100 MG tablet [id number] No TAKE 1 TABLET BY MOUTH EVERY DAY Webb, Padonda B, FNP Taking Active   Lutein  10 MG TABS [id number] No Take 10 mg by mouth daily. [provider] Taking Active Self  magic mouthwash (nystatin , lidocaine , diphenhydrAMINE , alum & mag hydroxide) suspension [id number]  Swish and spit 5 mLs 3 (three) times daily as needed for mouth pain. Palumbo, April, MD  Active   Magnesium  300 MG CAPS [id number] No 1 capsule with a meal Orally Once a day [provider] Taking Active   MAGNESIUM  CITRATE PO [id number] No Take 250 mg by mouth. [provider] Taking Active   MULTIPLE VITAMIN PO [id number] No Take 1 tablet by mouth daily. [provider] Taking Active Self  Omega-3 Fatty Acids (FISH OIL) 1000 MG CAPS [id number] No   [provider] Taking Active   pantoprazole  (PROTONIX ) 40 MG tablet [id number] No TAKE 1 TABLET BY MOUTH EVERY DAY Olalere, Adewale A, MD Taking Active   potassium chloride  SA (KLOR-CON  M) 10 MEQ tablet [id number]  Take 1 tablet (10 mEq total) by mouth daily for 5 doses. Malachy Comer GAILS, NP  Expired 04/20/23 2359   predniSONE  (DELTASONE ) 20 MG tablet [id number]  3 tabs po day one, then 2 po daily x 4 days Palumbo, April, MD  Active   rosuvastatin  (CRESTOR ) 10 MG tablet [id number] No TAKE 1 TABLET BY MOUTH EVERY DAY Walker, Caitlin S, NP Taking Active   Spacer/Aero-Holding Chambers DEVI [id number] No Use with spacer Malachy Comer GAILS, NP Taking Active   valACYclovir  (VALTREX ) 500 MG tablet [id number] No As needed [provider] Taking Active             Home Care and Equipment/Supplies: Were Home Health Services Ordered?: NA Any new equipment or medical supplies ordered?: NA  Functional Questionnaire: Do you need assistance with bathing/showering or dressing?: No Do you need assistance with meal preparation?: No Do you need assistance with eating?: No Do you have difficulty maintaining continence: No Do you need assistance with getting out of bed/getting out of a chair/moving?: No Do you have difficulty managing or taking your medications?: No  Follow up appointments reviewed: PCP Follow-up appointment confirmed?: Yes MD Provider Line [phone] Given: Yes Date of PCP follow-up appointment?: 05/07/23 Follow-up Provider: Raymond De [place] [facility] Follow-up appointment confirmed?: NA Do you need transportation to your follow-up appointment?: No Do you understand care options if your condition(s) worsen?: Yes-patient verbalized understanding   Meliza Kage, CMA  [facility] AWV Team Direct Dial: [phone]

## 2023-05-05 NOTE — Transitions of Care (Post Inpatient/ED Visit) (Signed)
   05/05/2023  Name: ARNESIA VINCELETTE MRN: [id number] DOB: [birth year]  Today's TOC FU Call Status: Today's TOC FU Call Status:: Unsuccessful Call (1st Attempt) Unsuccessful Call (1st Attempt) Date: 05/05/23  Attempted to reach the patient regarding the most recent Inpatient/ED visit.  Follow Up Plan: Additional outreach attempts will be made to reach the patient to complete the Transitions of Care (Post Inpatient/ED visit) call.   Sahej Hauswirth, CMA  [facility] AWV Team Direct Dial: [phone]

## 2023-05-07 ENCOUNTER — Ambulatory Visit (INDEPENDENT_AMBULATORY_CARE_PROVIDER_SITE_OTHER): Payer: Medicare HMO | Admitting: Family Medicine

## 2023-05-07 ENCOUNTER — Encounter (HOSPITAL_BASED_OUTPATIENT_CLINIC_OR_DEPARTMENT_OTHER): Payer: Self-pay | Admitting: Family Medicine

## 2023-05-07 VITALS — BP 136/76 | HR 72 | Ht 67.0 in | Wt 289.0 lb

## 2023-05-07 DIAGNOSIS — E876 Hypokalemia: Secondary | ICD-10-CM

## 2023-05-07 DIAGNOSIS — R051 Acute cough: Secondary | ICD-10-CM | POA: Diagnosis not present

## 2023-05-07 MED ORDER — POTASSIUM CHLORIDE CRYS ER 10 MEQ PO TBCR: 10.0000 meq | EXTENDED_RELEASE_TABLET | Freq: Every day | ORAL | 1 refills | Status: DC

## 2023-05-07 NOTE — Patient Instructions (Signed)

## 2023-05-07 NOTE — Progress Notes (Signed)
    Procedures performed today:    None.  Independent interpretation of notes and tests performed by another provider:   None.  Brief History, Exam, Impression, and Recommendations:    BP 136/76 (BP Location: Right Arm, Patient Position: Sitting, Cuff Size: Normal)   Pulse 72   Ht 5' 7 (1.702 m)   Wt 289 lb (131.1 kg)   SpO2 94%   BMI 45.26 kg/m   Hypokalemia -     Potassium Chloride  [facility] ER; Take 1 tablet (10 mEq total) by mouth daily.  Dispense: 90 tablet; Refill: 1  Acute cough Assessment & Plan: Patient seen recently in clinic for acute URI/cough.  Unfortunately, she did have some worsening noted and presented to emergency department for evaluation.  Evaluation at that time was generally reassuring and ultimately, patient was able to be discharged home for continued outpatient management.  Emergency department visit was about 5 days ago.  She reports that she generally has been improving since that time.  She does continue to have some exertional shortness of breath, but this is improving.  She continues to have low energy levels/feelings of fatigue, but also feels that this is improving.  She has occasionally required supplemental oxygen  with use of CPAP overnight.  She does have follow-up appointment scheduled with pulmonology in 1 week. On exam, patient is in no acute distress, vital signs stable.  Cardiovascular exam with regular rate and rhythm, lungs clear to auscultation bilaterally.  No increased work of breathing while in office, able to speak in complete sentences. Previously tested negative for COVID, influenza.  Suspect that recent illness was related to 5 viral etiology.  Recommend continuing with conservative measures.  Recommend continuing with evaluation with pulmonology next week.  Again discussed precautions.  She does also have questions about returning to prior activities such as exercise, discussed recommendations related to this. Will plan to follow-up at  previously scheduled office visit or sooner as needed    ___________________________________________ Jaydon Avina de [place], MD, ABFM, [facility]

## 2023-05-07 NOTE — Assessment & Plan Note (Signed)
 Patient seen recently in clinic for acute URI/cough.  Unfortunately, she did have some worsening noted and presented to emergency department for evaluation.  Evaluation at that time was generally reassuring and ultimately, patient was able to be discharged home for continued outpatient management.  Emergency department visit was about 5 days ago.  She reports that she generally has been improving since that time.  She does continue to have some exertional shortness of breath, but this is improving.  She continues to have low energy levels/feelings of fatigue, but also feels that this is improving.  She has occasionally required supplemental oxygen  with use of CPAP overnight.  She does have follow-up appointment scheduled with pulmonology in 1 week. On exam, patient is in no acute distress, vital signs stable.  Cardiovascular exam with regular rate and rhythm, lungs clear to auscultation bilaterally.  No increased work of breathing while in office, able to speak in complete sentences. Previously tested negative for COVID, influenza.  Suspect that recent illness was related to 5 viral etiology.  Recommend continuing with conservative measures.  Recommend continuing with evaluation with pulmonology next week.  Again discussed precautions.  She does also have questions about returning to prior activities such as exercise, discussed recommendations related to this. Will plan to follow-up at previously scheduled office visit or sooner as needed

## 2023-05-08 NOTE — Telephone Encounter (Signed)
 Spoke with pt she states today her POX is 95% she is slowly feeling better just a slow process.

## 2023-05-11 ENCOUNTER — Other Ambulatory Visit (HOSPITAL_BASED_OUTPATIENT_CLINIC_OR_DEPARTMENT_OTHER): Payer: Self-pay | Admitting: *Deleted

## 2023-05-11 ENCOUNTER — Encounter (HOSPITAL_BASED_OUTPATIENT_CLINIC_OR_DEPARTMENT_OTHER): Payer: Self-pay | Admitting: Family Medicine

## 2023-05-11 DIAGNOSIS — E876 Hypokalemia: Secondary | ICD-10-CM

## 2023-05-11 MED ORDER — POTASSIUM CHLORIDE CRYS ER 10 MEQ PO TBCR: 10.0000 meq | EXTENDED_RELEASE_TABLET | Freq: Every day | ORAL | 1 refills | Status: DC

## 2023-05-13 ENCOUNTER — Other Ambulatory Visit: Payer: Self-pay | Admitting: *Deleted

## 2023-05-13 ENCOUNTER — Telehealth: Payer: Self-pay | Admitting: *Deleted

## 2023-05-13 DIAGNOSIS — G4733 Obstructive sleep apnea (adult) (pediatric): Secondary | ICD-10-CM

## 2023-05-13 NOTE — Telephone Encounter (Signed)
Called and spoke with patient, she states she is better from the URI she was seen in the ED for recently.  She does not need to be seen for an acute visit.  Advised that she has not had the ONO on her CPAP with no oxygen as Florentina Addison had wanted and she had wanted her to f/u in 6-8 weeks.  Advised that she would receive a call regarding the ONO and we would follow up with her in March.   Scheduled next available with Katie.  I let her know to call us sooner if she needed something.  She verbalized understanding.  Nothing further needed.

## 2023-05-14 ENCOUNTER — Ambulatory Visit: Payer: Medicare HMO | Admitting: Nurse Practitioner

## 2023-05-16 DIAGNOSIS — J9601 Acute respiratory failure with hypoxia: Secondary | ICD-10-CM | POA: Diagnosis not present

## 2023-05-25 ENCOUNTER — Ambulatory Visit: Payer: Medicare HMO | Admitting: Nurse Practitioner

## 2023-05-27 DIAGNOSIS — J4550 Severe persistent asthma, uncomplicated: Secondary | ICD-10-CM | POA: Diagnosis not present

## 2023-05-28 ENCOUNTER — Telehealth: Payer: Self-pay | Admitting: Nurse Practitioner

## 2023-05-28 DIAGNOSIS — G4733 Obstructive sleep apnea (adult) (pediatric): Secondary | ICD-10-CM

## 2023-05-28 DIAGNOSIS — J9601 Acute respiratory failure with hypoxia: Secondary | ICD-10-CM

## 2023-05-29 NOTE — Telephone Encounter (Signed)
 05/25/2023 ONO on CPAP/ra: 3 hr 13 min </88% with SpO2 low 69%, average 88%. Need to bleed 2 lpm through CPAP and then recheck ONO. Thanks!   Spoke with pt and notified of results per [facility]. Pt verbalized understanding and denied any questions.  Order placed for o2 and ONO

## 2023-06-04 ENCOUNTER — Telehealth: Payer: Self-pay

## 2023-06-04 ENCOUNTER — Other Ambulatory Visit (HOSPITAL_COMMUNITY): Payer: Self-pay

## 2023-06-04 ENCOUNTER — Telehealth: Payer: Self-pay | Admitting: Nurse Practitioner

## 2023-06-04 ENCOUNTER — Other Ambulatory Visit: Payer: Self-pay

## 2023-06-04 ENCOUNTER — Other Ambulatory Visit: Payer: Self-pay | Admitting: *Deleted

## 2023-06-04 MED ORDER — TEZSPIRE 210 MG/1.91ML ~~LOC~~ SOAJ: 210.0000 mg | SUBCUTANEOUS | 11 refills | Status: DC

## 2023-06-04 NOTE — Telephone Encounter (Signed)
 She already had ONO on room air/CPAP? The most recent ordered ONO should be on CPAP/supplemental O2 2 lpm.  "05/25/2023 ONO on CPAP/ra: 3 hr 13 min </88% with SpO2 low 69%, average 88%. Need to bleed 2 lpm through CPAP and then recheck ONO. Thanks!   Spoke with pt and notified of results per [facility]. Pt verbalized understanding and denied any questions.  Order placed for o2 and ONO"  The ICD 10 code should be nocturnal hypoxia not OSA.

## 2023-06-04 NOTE — Telephone Encounter (Signed)
 Forwarding to [employer], NP as a FYI. I dont see an order for sleep study. An order was placed for O2 on 05-29-23. If you look under procedures, pt had a HST on 02-13-2014. The lov from Rhunette Croft, NP stated " Overnight oxygen study off oxygen and on CPAP "

## 2023-06-04 NOTE — Telephone Encounter (Signed)
 Test claim info sent to [facility]

## 2023-06-04 NOTE — Telephone Encounter (Signed)
 A list of diagnostic approval codes from [employer] will be faxed over from [employer] . They were unable to bill the sleep study with the diagnosis of OSA.

## 2023-06-05 ENCOUNTER — Ambulatory Visit: Payer: Medicare HMO | Admitting: Nurse Practitioner

## 2023-06-09 ENCOUNTER — Telehealth: Payer: Self-pay

## 2023-06-09 ENCOUNTER — Telehealth: Payer: Self-pay | Admitting: *Deleted

## 2023-06-09 NOTE — Addendum Note (Signed)
 Addended by: Devoria Glassing on: 06/09/2023 11:38 AM   Modules accepted: Orders

## 2023-06-09 NOTE — Telephone Encounter (Signed)
 Called pt to ask if she could bring in her sd card to her appointment tomorrow she said yes and NFN

## 2023-06-09 NOTE — Telephone Encounter (Signed)
 Called patient to advised I had followed steps to change patient over to [employer]. When having the pharmacy run the claim for same after an approval was done her copay was $100 for the medication so [employer] will not consider her for patient assistance. Patient wants to remain on Fasenra at this time due to affordability

## 2023-06-09 NOTE — Telephone Encounter (Signed)
 Noted.

## 2023-06-10 ENCOUNTER — Ambulatory Visit: Payer: Medicare HMO | Admitting: Nurse Practitioner

## 2023-06-10 ENCOUNTER — Encounter: Payer: Self-pay | Admitting: Nurse Practitioner

## 2023-06-10 VITALS — BP 140/66 | HR 76 | Ht 67.0 in | Wt 293.2 lb

## 2023-06-10 DIAGNOSIS — J4550 Severe persistent asthma, uncomplicated: Secondary | ICD-10-CM

## 2023-06-10 DIAGNOSIS — R600 Localized edema: Secondary | ICD-10-CM

## 2023-06-10 DIAGNOSIS — Z87891 Personal history of nicotine dependence: Secondary | ICD-10-CM | POA: Diagnosis not present

## 2023-06-10 DIAGNOSIS — Z6841 Body Mass Index (BMI) 40.0 and over, adult: Secondary | ICD-10-CM

## 2023-06-10 DIAGNOSIS — G4733 Obstructive sleep apnea (adult) (pediatric): Secondary | ICD-10-CM | POA: Diagnosis not present

## 2023-06-10 DIAGNOSIS — J9611 Chronic respiratory failure with hypoxia: Secondary | ICD-10-CM

## 2023-06-10 DIAGNOSIS — R0609 Other forms of dyspnea: Secondary | ICD-10-CM

## 2023-06-10 NOTE — Progress Notes (Unsigned)
**Note Amy-Identified via Obfuscation**  @Patient  ID: Amy Stokes, female    DOB: Feb 08, 1951, 73 y.o.   MRN: [id number]  Chief Complaint  Patient presents with   Follow-up    Patient states she is doing well.    Referring provider: de Peru, Buren Kos, MD  HPI: 73 year old female, former remote smoker followed for asthma and OSA. She is a patient of Amy. Trena Stokes and last seen in office 04/24/2023 by [facility] NP. Past medical history significant for obesity, HTN, chronic A fib on Eliquis, hypothyroidism with thyroid goiter, HLD. Followed by asthma/allergy and started on Fasenra June 2024.  TEST/EVENTS:  02/13/2014 HST: AHI 16.7, SpO2 low 70% 11/01/2020 echocardiogram: EF 55-60%, LV diastolic parameters were indeterminate. Previous echo with GIIDD. RV function nl but mildly enlarged. Unable to measure PASP.  07/23/2021 CPAP titration: optimal pressure 12 cmH2O  12/25/2021 IgE 2,005; eos 200  01/01/2022 PFT: FVC 54, FEV1 41, ratio 65, TLC 88, DLCOcor 98. Severe obstruction with reversibility (20% change).  03/11/2023 BNP 227 04/14/2023 CXR: minimal bibasilar atelectasis. Mild chronic bronchitic changes 04/16/2023 echo: EF 60 to 65%.  Left ventricular diastolic parameters are indeterminate.  RV function normal.  RV size moderately enlarged.  LA mildly dilated.  Mild MR.  06/04/2021: OV with Amy Kutch NP. Worsening cough and wheezing over the past month. CBC with elevated eosinophils at 0.3 and IgE elevated to 1409. Started on singulair and short prednisone burst. Referred to allergy for further workup. Initiated therapies for postnasal drainage and cough control with mucinex, delsym, chlortab, and flonase. Reported some difficulties with increased SOB at night and drops in her oxygen when she would get up to use the bathroom despite consistent use of her CPAP. CPAP titration was ordered for further evaluation for oxygen requirement. Also reported some recent increase in BLE edema recently which cardiology had started her on lasix for 4-5 days.  Appeared compensated on exam. CXR without acute process; chronic linear opacities b/l unchanged and consistent with scarring.   07/05/2021: OV with Amy Appleby NP for follow up. She reports that she was feeling better and her cough had improved up until this past week. She felt like her allergies slightly worsened and that her cough increased in frequency. Remained non-productive. She also was noted to be 84% on room air upon arrival to exam room; quickly recovered to 94% with rest. She denied any recent shortness of breath but has noticed some occasional wheezing. She has still noticed that her oxygen levels have dropped into the 80's at night on her fit bit monitor, despite CPAP therapy. Plans for CPAP titration on 4/25. She reported that the swelling in her legs has improved as well, but she does notice that this is occasionally increased in the evenings. She has plans to follow up with cardiology. She continues on Advair HFA Twice daily, flonase, singulair, and over the counter allergy medicine. FeNO was nl and no evidence of bronchospasm on exam. Cough triggered by allergies. Added allegra for allergies - appt scheduled for May with allergist due to hyper IgE and persistent allergy symptoms.   09/12/2021: OV with Amy Detzel NP for follow up. Since I saw her last, she underwent CPAP titration study and was seen by allergist. CPAP titration study was read by Amy Stokes who recommended auto CPAP 4-20 cmH2O and sent orders for new machine, which the patient has received. She has felt like she sleeps well on this and has not had any further oxygen drops that she's noticed over night. Lowest SpO2 reading she  has gotten has been 88%, but her average report in the morning from her watch is in the 90's. She feels like she has not been as short of breath at night with the new machine either. She was then seen by Amy Stokes with allergy, whom she was referred to for elevated IgE, and underwent allergy testing with positives to mold,  cockroach and dust mites. They stepped up her inhaler to [id number] and continued her on allegra and singulair. Discussed possible biologic therapy moving forward.  Today, she reports feeling relatively the same recently. She did notice that she was using her inhaler around 530 in the morning and then not doing her second dose until around 9/10 pm at night. She tried changing this yesterday and did 530 am and 6 pm. She felt like this helped her breathing a lot and she wasn't as short of breath at the end of the day. She does still have an occasional cough, which is usually dry but she will occasionally get up some clear or white plegm, and some wheezing. Feels like allergies are better with Allegra. She denies hemoptysis, weight loss, fevers, night sweats, leg swelling.  FeNO 22 ppb   12/25/2021: OV with Amy Stokes. Acute visit with cough, wheeze, SOB. Treated with z pack and prednisone. Allergy testing positive to mold, dust, roach. Consider biologic if symptoms persist. Repeat PFT. On CPAP; tolerating well.   01/28/2023: OV with Amy Stokes. Tolerating CPAP. Getting good nights rest. History of multiple allergies. History of asthma. Breathing stable. Follows with allergist. IgE remains elevated; did discuss with her allergist. Continue bronchodilators. Reviewed biologic.   12/02/2022: OV with Amy Stokes. Tolerating CPAP well. Wakes up feeling rested. Breathing stable. No significant exacerbations. Wondered whether she can stop singulair and protonix. F/u one year.   04/14/2023: OV with Amy Hangartner NP. The patient, with a history of asthma and OSA, presented with concerns about lower oxygen levels that began after a fall on Thanksgiving Day. Prior to the fall, the patient's oxygen levels would occasionally drop, averaging around 90, sometimes as low as 88. However, post-fall, the patient noticed a decrease in oxygen levels, with readings as low as 86 with activity on room air.    The patient denied any missed doses  of Eliquis and reported no change in breathing patterns since the fall. The patient's allergy doctor, seen in early December, suggested that the breathing issues were not related to asthma, which seemed to be under control. She does have a daily productive cough, which is unchanged from her baseline. She's not sure of the mucus color. No wheezing or chest congestion.    The patient reported increased leg swelling and a weight increase over the past few months. Prior to the fall, the patient did not track her weight, but it was noted to be 294 pounds in September. She did reach out to her heart doctor and is on lasix 20 mg daily but increases to 40 mg some days, depending on swelling. Her weight is still above her dry weight at 304 lb. She is also having some more difficulties laying flat at night. She's been sleeping in a recliner with her CPAP. An echocardiogram was scheduled for further investigation, which is tomorrow. She has not had any evidence of bleeding or excessive bruising. Denies calf pain, palpitations, CP, dizziness/lightheadedness, fever, chills, or hemoptysis.   The patient is on a regimen of Breztri, Fasenra, and PRN albuterol for asthma, but has not felt the need to use the  rescue inhaler recently. The patient is also wearing a heart monitor.   04/24/2023: Today - follow up Patient presents today for follow-up.  At her last visit, she was started on supplemental oxygen due to exertional hypoxemia.  At the time, she had had significant leg swelling and increased weight gain, concerning for volume overload.  She had also had a minimal elevation in her BNP.  We adjusted her Lasix dosing, which she had a great response to. Today, she is down to 295 pounds which is her normal weight.  She had been up to 302 pounds at her last visit.  She feels like the swelling in her legs has gone down.  She is currently taking 20 mg of Lasix a day.  She did have a little bit of an increased weight after eating  [place] food the other day and took 40 mg of Lasix with improvement.  Feels like her breathing is better.  She did not really notice how out of breath she was before hand.  Feels like she is back to her baseline now.  Not noticing any low oxygen levels at home.  Not having any increased cough or chest congestion.  Wearing her CPAP nightly.  Has oxygen bled through it.  Has not really been using the supplemental oxygen during the day at all.  She did have an echocardiogram 1/16 that showed normal EF, indeterminate diastolic parameters, LA strain, RV strain.  They did not read her pulmonary artery pressures.  03/24/2023-04/22/2023: CPAP 4-16 cmH2O 30/30 days; 97% >4 hr; average use 8 hours 31 minutes Pressure 95th 7.7 Leaks 95th 4.1 AHI 1.1  No Known Allergies  Immunization History  Administered Date(s) Administered   Fluad Quad(high Dose 65+) 12/07/2019, 01/08/2021, 12/17/2021   Influenza Split 11/04/2012, 01/04/2014   Influenza, High Dose Seasonal PF 11/06/2016, 01/26/2018, 02/16/2019, 03/04/2023   Influenza,inj,Quad PF,6+ Mos 04/19/2015, 12/27/2015   Influenza-Unspecified 11/06/2016, 12/07/2019, 12/17/2021, 12/17/2021   Moderna Covid-19 Vaccine Bivalent Booster 22yrs & up 12/17/2021   PFIZER Comirnaty(Gray Top)Covid-19 Tri-Sucrose Vaccine 07/10/2020, 12/17/2021   PFIZER(Purple Top)SARS-COV-2 Vaccination 11/18/2018, 12/15/2018, 11/03/2019   Pfizer Covid-19 Vaccine Bivalent Booster 18yrs & up 01/08/2021   Pfizer(Comirnaty)Fall Seasonal Vaccine 12 years and older 12/17/2021   Pneumococcal Conjugate-13 04/03/2016   Pneumococcal Polysaccharide-23 01/31/2010, 04/11/2020   Respiratory Syncytial Virus Vaccine,Recomb Aduvanted(Arexvy) 02/18/2022   Tdap 07/28/2013, 12/28/2021   Unspecified SARS-COV-2 Vaccination 12/17/2021   Zoster Recombinant(Shingrix) 06/13/2017, 09/27/2017    Past Medical History:  Diagnosis Date   Arthritis    no meds   Asthma    Edema    Fibroids    Hyperlipidemia     Hypertension    Obesity    RML pneumonia 02/25/2021   Sleep apnea    wears cpap    Thyroid nodule 01/2009   left discovered on chest ct; needle bx "indeterminant" cannon r/o follicular neoplasm     Tobacco History: Social History   Tobacco Use  Smoking Status Former   Current packs/day: 0.00   Average packs/day: 1 pack/day for 1 year (1.0 ttl pk-yrs)   Types: Cigarettes   Start date: 04/28/1970   Quit date: 04/29/1971   Years since quitting: 52.1   Passive exposure: Past  Smokeless Tobacco Never   Counseling given: Not Answered   Outpatient Medications Prior to Visit  Medication Sig Dispense Refill   acetaminophen (TYLENOL) 650 MG CR tablet Take 1,300 mg by mouth every 8 (eight) hours as needed for pain.     albuterol (VENTOLIN HFA) 108 (  90 Base) MCG/ACT inhaler Inhale 2 puffs into the lungs every 4 (four) hours as needed for wheezing or shortness of breath (coughing fits). 18 g 1   apixaban (ELIQUIS) 5 MG TABS tablet Take 1 tablet (5 mg total) by mouth 2 (two) times daily. 180 tablet 3   Budeson-Glycopyrrol-Formoterol (BREZTRI AEROSPHERE) 160-9-4.8 MCG/ACT AERO Inhale 2 puffs into the lungs in the morning and at bedtime. 10.7 g 11   Cholecalciferol 50 MCG (2000 UT) CAPS Take 1 capsule by mouth daily. PATIENT USING OIL AND NOT CAPSULES     diltiazem (CARDIZEM CD) 300 MG 24 hr capsule TAKE 1 CAPSULE BY MOUTH EVERY DAY 90 capsule 3   flecainide (TAMBOCOR) 50 MG tablet TAKE 1 TABLET BY MOUTH TWICE A DAY 180 tablet 2   FLUZONE HIGH-DOSE 0.5 ML injection      furosemide (LASIX) 20 MG tablet TAKE 20-40MG  AS DIRECTED BY CARDIOLOGY OFFICE. 60 tablet 2   hydrochlorothiazide (HYDRODIURIL) 25 MG tablet Take 1 tablet (25 mg total) by mouth daily. 90 tablet 1   ipratropium-albuterol (DUONEB) 0.5-2.5 (3) MG/3ML SOLN Take 3 mLs by nebulization every 6 (six) hours as needed. 360 mL 0   levothyroxine (SYNTHROID) 100 MCG tablet TAKE 1 TABLET BY MOUTH EVERY MORNING ON AN EMPTY STOMACH Orally  Once a day 90 days 90 tablet 4   losartan (COZAAR) 100 MG tablet TAKE 1 TABLET BY MOUTH EVERY DAY 90 tablet 1   Lutein 10 MG TABS Take 10 mg by mouth daily.     Magnesium 300 MG CAPS 1 capsule with a meal Orally Once a day     MAGNESIUM CITRATE PO Take 250 mg by mouth.     MULTIPLE VITAMIN PO Take 1 tablet by mouth daily.     Omega-3 Fatty Acids (FISH OIL) 1000 MG CAPS      pantoprazole (PROTONIX) 40 MG tablet TAKE 1 TABLET BY MOUTH EVERY DAY 90 tablet 1   potassium chloride (KLOR-CON M) 10 MEQ tablet Take 1 tablet (10 mEq total) by mouth daily. 90 tablet 1   rosuvastatin (CRESTOR) 10 MG tablet TAKE 1 TABLET BY MOUTH EVERY DAY 90 tablet 1   Spacer/Aero-Holding Chambers DEVI Use with spacer 1 each 2   valACYclovir (VALTREX) 500 MG tablet As needed     magic mouthwash (nystatin, lidocaine, diphenhydrAMINE, alum & mag hydroxide) suspension Swish and spit 5 mLs 3 (three) times daily as needed for mouth pain. 50 mL 0   Tezepelumab-ekko (TEZSPIRE) 210 MG/1. SOAJ Inject 210 mg into the skin every 28 (twenty-eight) days. (Patient not taking: Reported on 06/10/2023) 1.91 mL 11   No facility-administered medications prior to visit.     Review of Systems:   Constitutional: No weight loss or gain, night sweats, fevers, chills, or lassitude. +fatigue (baseline) HEENT: No headaches, difficulty swallowing, tooth/dental problems, or sore throat. No itching, ear ache. +occasional sneezing, nasal congestion (baseline) CV:  +swelling in lower extremities (improved) No PND, orthopnea, chest pain, anasarca, dizziness, palpitations, syncope Resp: +chronic daily cough (baseline); shortness of breath with exertion (improved). No excess mucus or change in color of mucus. No wheezing. No hemoptysis. No chest wall deformity GI:  No heartburn, indigestion, abdominal pain, nausea, vomiting, diarrhea, change in bowel habits, loss of appetite, bloody stools.  GU: No dysuria, frequency, urgency, hematuria  Skin: No  rash, lesions, ulcerations Neuro: No dizziness or lightheadedness.  Psych: No depression or anxiety. Mood stable.     Physical Exam:  BP (!) 140/66 (BP Location: Right  Arm, Patient Position: Sitting, Cuff Size: Normal)   Pulse 76   Ht 5\' 7"  (1.702 m)   Wt 293 lb 3.2 oz (133 kg)   SpO2 96%   BMI 45.92 kg/m   GEN: Pleasant, interactive, well-kempt; obese; in no acute distress. HEENT:  Normocephalic and atraumatic. PERRLA. Sclera white. Nasal turbinates pink, moist and patent bilaterally. No rhinorrhea present. Oropharynx pink and moist, without exudate or edema. No lesions, ulcerations  NECK:  Supple w/ fair ROM. No JVD present. Normal carotid impulses w/o bruits. Thyroid symmetrical with no goiter or nodules palpated. No lymphadenopathy.   CV: RRR, no m/r/g, +1 BLE pitting edema. Pulses intact, +2 bilaterally. No cyanosis, pallor or clubbing. PULMONARY:  Unlabored, regular breathing. Clear bilaterally A&P w/o wheezes/rales/rhonchi. No accessory muscle use. No dullness to percussion. GI: BS present and normoactive. Soft, non-tender to palpation. No organomegaly or masses detected.  MSK: No erythema, warmth or tenderness. Cap refil <2 sec all extrem. No deformities or joint swelling noted.  Neuro: A/Ox3. No focal deficits noted.   Skin: Warm, no lesions or rashe Psych: Normal affect and behavior. Judgement and thought content appropriate.     Lab Results:  CBC    Component Value Date/Time   WBC 7.6 05/02/2023 0509   RBC 4.19 05/02/2023 0509   HGB 11.4 (L) 05/02/2023 0509   HGB 12.8 01/10/2021 1016   HCT 37.1 05/02/2023 0509   HCT 39.7 01/10/2021 1016   PLT 233 05/02/2023 0509   PLT 266 01/10/2021 1016   MCV 88.5 05/02/2023 0509   MCV 84 01/10/2021 1016   MCH 27.2 05/02/2023 0509   MCHC 30.7 05/02/2023 0509   RDW 14.5 05/02/2023 0509   RDW 13.3 01/10/2021 1016   LYMPHSABS 1.2 05/02/2023 0509   MONOABS 0.7 05/02/2023 0509   EOSABS 0.0 05/02/2023 0509   BASOSABS 0.0  05/02/2023 0509    BMET    Component Value Date/Time   NA 140 05/02/2023 0509   NA 141 09/10/2021 0936   K 3.9 05/02/2023 0509   CL 102 05/02/2023 0509   CO2 33 (H) 05/02/2023 0509   GLUCOSE 105 (H) 05/02/2023 0509   BUN 13 05/02/2023 0509   BUN 12 09/10/2021 0936   CREATININE 0.54 05/02/2023 0509   CALCIUM 8.9 05/02/2023 0509   GFRNONAA >60 05/02/2023 0509   GFRAA 95 05/10/2008 0944    BNP    Component Value Date/Time   BNP 227.2 (H) 03/11/2023 1641     Imaging:  No results found.   Administration History     None          Latest Ref Rng & Units 01/01/2022   10:46 AM  PFT Results  FVC-Pre L 1.90   FVC-Predicted Pre % 54   FVC-Post L 2.06   FVC-Predicted Post % 58   Pre FEV1/FVC % % 59   Post FEV1/FCV % % 65   FEV1-Pre L 1.11   FEV1-Predicted Pre % 41   FEV1-Post L 1.34   DLCO uncorrected ml/min/mmHg 21.21   DLCO UNC% % 95   DLCO corrected ml/min/mmHg 21.84   DLCO COR %Predicted % 98   DLVA Predicted % 149   TLC L 5.02   TLC % Predicted % 88   RV % Predicted % 124     Lab Results  Component Value Date   NITRICOXIDE 17 06/04/2021        Assessment & Plan:   No problem-specific Assessment & Plan notes found for this encounter.  I spent 35 minutes of dedicated to the care of this patient on the date of this encounter to include pre-visit review of records, face-to-face time with the patient discussing conditions above, post visit ordering of testing, clinical documentation with the electronic health record, making appropriate referrals as documented, and communicating necessary findings to members of the patients care team.  Noemi Chapel, NP 06/10/2023  Pt aware and understands NP's role.

## 2023-06-10 NOTE — Patient Instructions (Addendum)
 Continue Albuterol inhaler 2 puffs or 3 mL neb every 6 hours as needed for shortness of breath or wheezing. Notify if symptoms persist despite rescue inhaler/neb use.  Continue Breztri 2 puffs Twice daily. Brush tongue and rinse mouth afterwards Continue fasenra injections as scheduled Ok to stay off oxygen with activity for now. Monitor and if above 88-90%, ok to keep O2 off.    Continue lasix 20 mg daily and increase to 40 mg daily for weight gain 2-3 lb overnight or 5 lb in a week or worsening swelling. If you can't get the weight back down or swelling to decrease, call your heart doctor or me   Continue CPAP nightly with oxygen bled through 2 lpm  Continue to use CPAP every night, minimum of 4-6 hours a night.  Change equipment as directed. Wash your tubing with warm soap and water daily, hang to dry. Wash humidifier portion weekly. Use bottled, distilled water and change daily Be aware of reduced alertness and do not drive or operate heavy machinery if experiencing this or drowsiness.   I'm worried that your shortness of breath and oxygen requirements are coming from pulmonary hypertension. You have strain to the right side of your heart and this would explain the fluid overload you have had. We will set you up for a right heart catheterization with Dr. Shirlee Latch. Someone will contact you for scheduling. We will also have you complete a repeat lung function testing    Follow up in 6-8 weeks with Dr. Wynona Neat or Florentina Addison Pema Thomure,NP. If symptoms do not improve or worsen, please contact office for sooner follow up or seek emergency care.

## 2023-06-11 ENCOUNTER — Encounter: Payer: Self-pay | Admitting: Nurse Practitioner

## 2023-06-11 ENCOUNTER — Other Ambulatory Visit (HOSPITAL_BASED_OUTPATIENT_CLINIC_OR_DEPARTMENT_OTHER): Payer: Self-pay | Admitting: Family Medicine

## 2023-06-11 ENCOUNTER — Ambulatory Visit (HOSPITAL_BASED_OUTPATIENT_CLINIC_OR_DEPARTMENT_OTHER): Payer: Medicare HMO | Admitting: Cardiology

## 2023-06-11 DIAGNOSIS — R0609 Other forms of dyspnea: Secondary | ICD-10-CM | POA: Insufficient documentation

## 2023-06-11 DIAGNOSIS — E876 Hypokalemia: Secondary | ICD-10-CM

## 2023-06-11 NOTE — Assessment & Plan Note (Signed)
 Concern for PH given risk factors, constellation of symptoms and RV strain on recent echo. DOE is disproportional to her asthma and lungs on clear on exam. She continues to have intermittent episodes of hypoxia on exertion noted at home but was able to maintain >89% on room air during walk test today. Encouraged her to continue monitoring and utilize to maintain levels >88-90%. Given her new oxygen requirements, progressive DOE, evidence of volume overload, and concern for PH, will set her up with Dr. Shirlee Latch for RHC for further evaluation. In interim, she will continue lasix 20 mg daily with double dose if weight gain occurs. Pt agreeable to this plan and verbalized understanding.  Patient Instructions  Continue Albuterol inhaler 2 puffs or 3 mL neb every 6 hours as needed for shortness of breath or wheezing. Notify if symptoms persist despite rescue inhaler/neb use.  Continue Breztri 2 puffs Twice daily. Brush tongue and rinse mouth afterwards Continue fasenra injections as scheduled Ok to stay off oxygen with activity for now. Monitor and if above 88-90%, ok to keep O2 off.    Continue lasix 20 mg daily and increase to 40 mg daily for weight gain 2-3 lb overnight or 5 lb in a week or worsening swelling. If you can't get the weight back down or swelling to decrease, call your heart doctor or me   Continue CPAP nightly with oxygen bled through 2 lpm  Continue to use CPAP every night, minimum of 4-6 hours a night.  Change equipment as directed. Wash your tubing with warm soap and water daily, hang to dry. Wash humidifier portion weekly. Use bottled, distilled water and change daily Be aware of reduced alertness and do not drive or operate heavy machinery if experiencing this or drowsiness.   I'm worried that your shortness of breath and oxygen requirements are coming from pulmonary hypertension. You have strain to the right side of your heart and this would explain the fluid overload you have had. We  will set you up for a right heart catheterization with Dr. Shirlee Latch. Someone will contact you for scheduling. We will also have you complete a repeat lung function testing    Follow up in 6-8 weeks with Dr. Wynona Neat or Florentina Addison Xzandria Clevinger,NP. If symptoms do not improve or worsen, please contact office for sooner follow up or seek emergency care.

## 2023-06-11 NOTE — Telephone Encounter (Signed)
 Please see other message on patient this has been sent to provider

## 2023-06-11 NOTE — Assessment & Plan Note (Signed)
 BMI 45. Encouraged healthy weight loss measures

## 2023-06-11 NOTE — Telephone Encounter (Signed)
 Patient called to clarify the pharmacy to send potassium refill to and [facility] [address].   [id number] [place], [place] - [address] [place] [address] Phone: [phone] Fax: [phone]

## 2023-06-11 NOTE — Telephone Encounter (Signed)
 Copied from CRM [id number]. Topic: Clinical - Medication Refill >> Jun 11, 2023  3:38 PM Truddie Crumble wrote: Most Recent Primary Care Visit:  Provider: DE Peru, RAYMOND J  Department: [facility]  Visit Type: HOSPITAL FOLLOW UP  Date: 05/07/2023  Medication: potassium chloride (KLOR-CON M) 10 MEQ tablet (Patient has ran out of medication and can not get anymore until may)  Has the patient contacted their pharmacy? Yes (Agent: If no, request that the patient contact the pharmacy for the refill. If patient does not wish to contact the pharmacy document the reason why and proceed with request.) (Agent: If yes, when and what did the pharmacy advise?)  Is this the correct pharmacy for this prescription? Yes If no, delete pharmacy and type the correct one.  This is the patient's preferred pharmacy:  [id number] [place], [place] - [address] [place] [address] Phone: [phone] Fax: [phone]  [facility] [address] [place] [address] Phone: [phone] Fax: [phone]  [facility] [place], [place] - [address] [place] [address] [place] [address] Phone: [phone] Fax: [phone]  Has the prescription been filled recently? no  Is the patient out of the medication? Yes  Has the patient been seen for an appointment in the last year OR does the patient have an upcoming appointment? Yes  Can we respond through MyChart? Yes  Agent: Please be advised that Rx refills may take up to 3 business days. We ask that you follow-up with your pharmacy.

## 2023-06-11 NOTE — Assessment & Plan Note (Signed)
 Severe obstructive asthma with allergic phenotype. Followed by asthma/allergy with Dr. Selena Batten. Maintained on aggressive treatment regimen with Deeann Saint. Appears stable. See above. Action plan in place. Avoid known triggers.

## 2023-06-11 NOTE — Assessment & Plan Note (Signed)
 Moderate OSA on CPAP. Excellent compliance and control. Receives benefit from use. Utilizing 2 lpm bled through for nocturnal hypoxia with desaturations on prior ONO. Aware of risks of untreated OSA. Healthy weight loss encouraged. Safe driving practices reviewed.

## 2023-06-11 NOTE — Addendum Note (Signed)
 Addended by: Devoria Glassing on: 06/11/2023 08:24 AM   Modules accepted: Orders

## 2023-06-11 NOTE — Telephone Encounter (Unsigned)
 Copied from CRM [id number]. Topic: Clinical - Medication Refill >> Jun 11, 2023  3:38 PM Truddie Crumble wrote: Most Recent Primary Care Visit:  Provider: DE Peru, RAYMOND J  Department: [facility]  Visit Type: HOSPITAL FOLLOW UP  Date: 05/07/2023  Medication: potassium chloride (KLOR-CON M) 10 MEQ tablet (Patient has ran out of medication and can not get anymore until may)  Has the patient contacted their pharmacy? Yes (Agent: If no, request that the patient contact the pharmacy for the refill. If patient does not wish to contact the pharmacy document the reason why and proceed with request.) (Agent: If yes, when and what did the pharmacy advise?)  Is this the correct pharmacy for this prescription? Yes If no, delete pharmacy and type the correct one.  This is the patient's preferred pharmacy:  [id number] [place], [place] - [address] [place] [address] Phone: [phone] Fax: [phone]  [facility] [address] [place] [address] Phone: [phone] Fax: [phone]  [facility] [place], [place] - [address] [place] [address] [place] [address] Phone: [phone] Fax: [phone]  Has the prescription been filled recently? no  Is the patient out of the medication? Yes  Has the patient been seen for an appointment in the last year OR does the patient have an upcoming appointment? Yes  Can we respond through MyChart? Yes  Agent: Please be advised that Rx refills may take up to 3 business days. We ask that you follow-up with your pharmacy.

## 2023-06-11 NOTE — Assessment & Plan Note (Signed)
See above. Goal >88-90%.

## 2023-06-11 NOTE — Assessment & Plan Note (Addendum)
 See above. Will obtain HRCT chest to rule out ILD. Repeat PFT to assess DLCO and lung volumes; recent spirometry with severe obstruction but no decline when compared to prior testing. Schedule when able. Continue graded exercises and asthma management. Healthy weight loss encouraged as deconditioning/body habitus likely contributing factors.

## 2023-06-12 ENCOUNTER — Encounter (HOSPITAL_BASED_OUTPATIENT_CLINIC_OR_DEPARTMENT_OTHER): Payer: Self-pay | Admitting: *Deleted

## 2023-06-12 ENCOUNTER — Other Ambulatory Visit: Payer: Self-pay | Admitting: Internal Medicine

## 2023-06-12 DIAGNOSIS — E876 Hypokalemia: Secondary | ICD-10-CM

## 2023-06-13 DIAGNOSIS — J9601 Acute respiratory failure with hypoxia: Secondary | ICD-10-CM | POA: Diagnosis not present

## 2023-06-15 ENCOUNTER — Ambulatory Visit
Admission: RE | Admit: 2023-06-15 | Discharge: 2023-06-15 | Disposition: A | Discharge status: Home / Self Care | Source: Ambulatory Visit | Attending: Nurse Practitioner | Admitting: Nurse Practitioner

## 2023-06-15 DIAGNOSIS — I700 Atherosclerosis of aorta: Secondary | ICD-10-CM | POA: Diagnosis not present

## 2023-06-15 DIAGNOSIS — R0609 Other forms of dyspnea: Secondary | ICD-10-CM

## 2023-06-15 DIAGNOSIS — R0602 Shortness of breath: Secondary | ICD-10-CM | POA: Diagnosis not present

## 2023-06-15 DIAGNOSIS — I2510 Atherosclerotic heart disease of native coronary artery without angina pectoris: Secondary | ICD-10-CM | POA: Diagnosis not present

## 2023-06-15 DIAGNOSIS — J9611 Chronic respiratory failure with hypoxia: Secondary | ICD-10-CM

## 2023-06-29 DIAGNOSIS — H40013 Open angle with borderline findings, low risk, bilateral: Secondary | ICD-10-CM | POA: Diagnosis not present

## 2023-06-29 DIAGNOSIS — H35033 Hypertensive retinopathy, bilateral: Secondary | ICD-10-CM | POA: Diagnosis not present

## 2023-06-29 DIAGNOSIS — H25813 Combined forms of age-related cataract, bilateral: Secondary | ICD-10-CM | POA: Diagnosis not present

## 2023-06-29 DIAGNOSIS — H524 Presbyopia: Secondary | ICD-10-CM | POA: Diagnosis not present

## 2023-06-29 DIAGNOSIS — H353131 Nonexudative age-related macular degeneration, bilateral, early dry stage: Secondary | ICD-10-CM | POA: Diagnosis not present

## 2023-06-29 LAB — HM DIABETES EYE EXAM

## 2023-06-30 ENCOUNTER — Encounter (HOSPITAL_BASED_OUTPATIENT_CLINIC_OR_DEPARTMENT_OTHER): Payer: Self-pay | Admitting: *Deleted

## 2023-07-04 ENCOUNTER — Other Ambulatory Visit (HOSPITAL_BASED_OUTPATIENT_CLINIC_OR_DEPARTMENT_OTHER): Payer: Self-pay | Admitting: Family Medicine

## 2023-07-04 DIAGNOSIS — E876 Hypokalemia: Secondary | ICD-10-CM

## 2023-07-09 ENCOUNTER — Ambulatory Visit (HOSPITAL_BASED_OUTPATIENT_CLINIC_OR_DEPARTMENT_OTHER): Payer: Medicare HMO | Admitting: Nurse Practitioner

## 2023-07-09 ENCOUNTER — Encounter (HOSPITAL_BASED_OUTPATIENT_CLINIC_OR_DEPARTMENT_OTHER): Payer: Self-pay | Admitting: Nurse Practitioner

## 2023-07-09 VITALS — BP 126/82 | HR 72 | Ht 67.0 in | Wt 291.4 lb

## 2023-07-09 DIAGNOSIS — Z7901 Long term (current) use of anticoagulants: Secondary | ICD-10-CM | POA: Diagnosis not present

## 2023-07-09 DIAGNOSIS — I5032 Chronic diastolic (congestive) heart failure: Secondary | ICD-10-CM | POA: Diagnosis not present

## 2023-07-09 DIAGNOSIS — I2510 Atherosclerotic heart disease of native coronary artery without angina pectoris: Secondary | ICD-10-CM

## 2023-07-09 DIAGNOSIS — I4819 Other persistent atrial fibrillation: Secondary | ICD-10-CM | POA: Diagnosis not present

## 2023-07-09 DIAGNOSIS — Z79899 Other long term (current) drug therapy: Secondary | ICD-10-CM

## 2023-07-09 DIAGNOSIS — I700 Atherosclerosis of aorta: Secondary | ICD-10-CM

## 2023-07-09 MED ORDER — SPIRONOLACTONE 25 MG PO TABS: 25.0000 mg | ORAL_TABLET | Freq: Every day | ORAL | 3 refills | Status: DC

## 2023-07-09 NOTE — Progress Notes (Signed)
 Cardiology Office Note:  .   Date:  07/09/2023  ID:  Amy Stokes, DOB 26-Jul-1950, MRN [id number] PCP: de Peru, Buren Kos, MD  [facility] Providers Cardiologist:  Jodelle Red, MD Electrophysiologist:  Will Jorja Loa, MD    Patient Profile: .      PMH PAF on chronic anticoagulation On flecainide Hypertension Asthma OSA on CPAP Morbid obesity Former tobacco use Coronary artery calcification Aortic atherosclerosis Elevated lipoprotein a   Echo 10/2020 with EF 55-60%, no significant valve disease, normal RV.   Last cardiology clinic visit was 03/12/2023 with Dr. Cristal Deer.  She had presented to the ER the day prior with shortness of breath and productive cough.  Also noted bilateral LE edema and redness.  She was given furosemide, prednisone, and doxycycline but had not yet picked medications up.  BNP was 227.  ECG showed atrial fibrillation at 70 bpm.  CXR was unremarkable.  Her medications were reviewed and she was advised not to take both hydrochlorothiazide and Lasix on the same day.  Lower extremity edema was significant, she was advised to notify Dr. Cristal Deer if swelling did not resolve on 4 days of Lasix.  She contacted the office a few days later to report no significant improvement in LE edema on Lasix.  She was having some episodes of A-fib and cardiac monitor was ordered.  Cardiac monitor completed 04/27/2023 revealed predominant underlying rhythm sinus rhythm, atrial fibrillation/flutter occurred with a 36% burden with the longest episode lasting 1 day 15 hours with average HR 69 bpm.  Atrial flutter may be possible atrial tachycardia with variable block.  Echo completed 04/15/2022 revealed normal LVEF 60 to 65%, mild LVH, indeterminate diastolic parameters, moderately enlarged RV, mild LAE, mild MR.        History of Present Illness: .    History of Present Illness Amy Stokes is a very pleasant 72 y.o. female who presents today due to  increase in AFib episodes.  We reviewed her cardiac monitor which was completed 04/27/2023 and revealed 36% burden of atrial fibrillation/flutter.  She reports she does note episodes of irregular HR with HR at times going to 100.  These most often occur at night.  She does not report significantly elevated HR.  She has also been managing leg swelling, and has been monitoring her weight daily.  Reports weight is generally with in 1 to 2 pounds of baseline.  She takes resupplied 40 mg if weight is up 2 lb. She denies shortness of breath, orthopnea, PND, chest pain, abdominal distention. She notes "drops" in her breathing (O2 sats) which occur a few times a day, often during workouts with O2 sat in the 80s but quickly rebounds to the 90s.  She monitors with a pulse oximeter that she wears on her finger. Despite these episodes, she is able to complete her workouts and does not feel the need to stop. She uses a CPAP machine with two liters of oxygen at night and reports sleeping well, averaging six to eight hours of sleep per night. She notes that AFib episodes often occur at night.  Recent CT scan ordered by her pulmonary doctor, which revealed atherosclerosis of the aorta and coronary artery calcification. The patient is also enrolled in a clinical trial for elevated lipoprotein A with a clinical trial group. She does not recall LP(a) total.    Discussed the use of AI scribe software for clinical note transcription with the patient, who gave verbal consent to proceed.  ROS: See HPI       Studies Reviewed: [name]        No results found for: "LIPOA"   Risk Assessment/Calculations:    CHA2DS2-VASc Score = 5   This indicates a 7.2% annual risk of stroke. The patient's score is based upon: CHF History: 1 HTN History: 1 Diabetes History: 0 Stroke History: 0 Vascular Disease History: 1 Age Score: 1 Gender Score: 1            Physical Exam:   VS:  BP 126/82   Pulse 72   Ht 5\' 7"  (1.702 m)   Wt 291  lb 6.4 oz (132.2 kg)   SpO2 91%   BMI 45.64 kg/m    Wt Readings from Last 3 Encounters:  07/09/23 291 lb 6.4 oz (132.2 kg)  06/10/23 293 lb 3.2 oz (133 kg)  05/07/23 289 lb (131.1 kg)    GEN: Obese, well developed in no acute distress NECK: No JVD; No carotid bruits CARDIAC: RRR, no murmurs, rubs, gallops RESPIRATORY:  Clear to auscultation without rales, wheezing or rhonchi  ABDOMEN: Soft, non-tender, non-distended EXTREMITIES:  No edema; No deformity     ASSESSMENT AND PLAN: .    Assessment & Plan Atrial Fibrillation on chronic anticoagulation She has a 36% burden of a fib/flutter on cardiac monitor completed 04/27/23. She is on flecainide 50 mg twice daily. Advised that frequent a fib may be contributing to fluid retention. She notes occasional palpitations and tachycardia but symptoms are not significant. Consider increasing the flecainide dose after adjusting spironolactone. Continue to limit caffeine. She is scheduled to see Otilio Saber, PA on 5/5 for EP follow-up.  Will forward note to him to consider increase in flecainide to 100 mg twice daily.  No bleeding concerns.  Continue Eliquis 5 mg twice daily which is appropriate dose for stroke prevention for CHA2DS2-VASc score of 5.  Chronic HFpEF/Fluid Retention   Leg swelling persists and is stable. Weight is stable. She denies dyspnea, orthopnea, PND, chest pain.  Fluid retention may relate to AFib and suboptimal heart function. We will discontinue hydrochlorothiazide and potassium and start spironolactone in order to up titrate management of chronic HFpEF. Continue furosemide 20 mg daily, 40 mg as needed for weight gain. Check BMET in two weeks.  Continue daily weight and low-sodium diet.  Atherosclerosis   CT scan shows aortic atherosclerosis and coronary artery calcification. She is exercising regularly and is not having any symptoms concerning for angina. Will work on rhythm control and management of fluid status as noted above.  Consider further ischemia evaluation at next appointment.  Hyperlipidemia LDL goal < 70 Lipid panel completed 11/13/2022 with total cholesterol 142, HDL 44, and LDL 84.  Not directly addressed today.  Continue rosuvastatin.  Consider repeat lipid testing at next appointment.        Disposition:3 months with me  Signed, Eligha Bridegroom, NP-C

## 2023-07-09 NOTE — Patient Instructions (Signed)
 Medication Instructions:  Your physician has recommended you make the following change in your medication:   START Spironolactone 25 mg  *If you need a refill on your cardiac medications before your next appointment, please call your pharmacy*  Lab Work: Your physician recommends that you return for lab work in 2 weeks: BMP  If you have labs (blood work) drawn today and your tests are completely normal, you will receive your results only by: MyChart Message (if you have MyChart) OR A paper copy in the mail If you have any lab test that is abnormal or we need to change your treatment, we will call you to review the results.  Follow-Up: At [facility], you and your health needs are our priority.  As part of our continuing mission to provide you with exceptional heart care, our providers are all part of one team.  This team includes your primary Cardiologist (physician) and Advanced Practice Providers or APPs (Physician Assistants and Nurse Practitioners) who all work together to provide you with the care you need, when you need it.  Your next appointment:   3 month(s)  Provider:   Jodelle Red, MD or Eligha Bridegroom, NP    We recommend signing up for the patient portal called "MyChart".  Sign up information is provided on this After Visit Summary.  MyChart is used to connect with patients for Virtual Visits (Telemedicine).  Patients are able to view lab/test results, encounter notes, upcoming appointments, etc.  Non-urgent messages can be sent to your provider as well.   To learn more about what you can do with MyChart, go to [email or web address].

## 2023-07-11 ENCOUNTER — Other Ambulatory Visit: Payer: Self-pay | Admitting: Pulmonary Disease

## 2023-07-11 DIAGNOSIS — R058 Other specified cough: Secondary | ICD-10-CM

## 2023-07-13 ENCOUNTER — Other Ambulatory Visit (HOSPITAL_BASED_OUTPATIENT_CLINIC_OR_DEPARTMENT_OTHER): Payer: Self-pay | Admitting: Family

## 2023-07-13 ENCOUNTER — Encounter (HOSPITAL_BASED_OUTPATIENT_CLINIC_OR_DEPARTMENT_OTHER): Payer: Self-pay

## 2023-07-14 ENCOUNTER — Ambulatory Visit (HOSPITAL_COMMUNITY)
Admission: RE | Admit: 2023-07-14 | Discharge: 2023-07-14 | Disposition: A | Discharge status: Home / Self Care | Source: Ambulatory Visit | Attending: Physician Assistant | Admitting: Physician Assistant

## 2023-07-14 ENCOUNTER — Encounter (HOSPITAL_COMMUNITY): Payer: Self-pay | Admitting: Physician Assistant

## 2023-07-14 ENCOUNTER — Telehealth: Payer: Self-pay | Admitting: Pharmacist

## 2023-07-14 VITALS — BP 134/60 | HR 72 | Ht 67.0 in | Wt 291.4 lb

## 2023-07-14 DIAGNOSIS — I10 Essential (primary) hypertension: Secondary | ICD-10-CM | POA: Insufficient documentation

## 2023-07-14 DIAGNOSIS — Z79899 Other long term (current) drug therapy: Secondary | ICD-10-CM | POA: Diagnosis not present

## 2023-07-14 DIAGNOSIS — J45909 Unspecified asthma, uncomplicated: Secondary | ICD-10-CM | POA: Insufficient documentation

## 2023-07-14 DIAGNOSIS — R0789 Other chest pain: Secondary | ICD-10-CM | POA: Insufficient documentation

## 2023-07-14 DIAGNOSIS — Z5181 Encounter for therapeutic drug level monitoring: Secondary | ICD-10-CM | POA: Diagnosis not present

## 2023-07-14 DIAGNOSIS — I2510 Atherosclerotic heart disease of native coronary artery without angina pectoris: Secondary | ICD-10-CM | POA: Diagnosis not present

## 2023-07-14 DIAGNOSIS — G4733 Obstructive sleep apnea (adult) (pediatric): Secondary | ICD-10-CM | POA: Insufficient documentation

## 2023-07-14 DIAGNOSIS — I700 Atherosclerosis of aorta: Secondary | ICD-10-CM | POA: Diagnosis not present

## 2023-07-14 DIAGNOSIS — R0602 Shortness of breath: Secondary | ICD-10-CM | POA: Diagnosis not present

## 2023-07-14 DIAGNOSIS — I480 Paroxysmal atrial fibrillation: Secondary | ICD-10-CM | POA: Diagnosis not present

## 2023-07-14 DIAGNOSIS — R5383 Other fatigue: Secondary | ICD-10-CM | POA: Insufficient documentation

## 2023-07-14 DIAGNOSIS — J9601 Acute respiratory failure with hypoxia: Secondary | ICD-10-CM | POA: Diagnosis not present

## 2023-07-14 DIAGNOSIS — D6869 Other thrombophilia: Secondary | ICD-10-CM | POA: Insufficient documentation

## 2023-07-14 DIAGNOSIS — Z7901 Long term (current) use of anticoagulants: Secondary | ICD-10-CM | POA: Diagnosis not present

## 2023-07-14 NOTE — Telephone Encounter (Signed)
 Medication list reviewed in anticipation of upcoming Tikosyn initiation. Patient is not taking any contraindicated medications. She is taking a few inhalers that can potentially cause QTc prolonging.   Furosemide and spironolactone: Monitor electrolytes closely  Albuterol: indeterminate QTc risk- monitor, but not therapeutic changes needed  Breztri: indeterminate QTc risk- monitor, but not therapeutic changes needed  Ipratropium/albuterol: indeterminate QTc risk- monitor, but not therapeutic changes needed  She will need to stop flecainide at least 3 days prior to admission  Patient is anticoagulated on Eliquis on the appropriate dose. Please ensure that patient has not missed any anticoagulation doses in the 3 weeks prior to Tikosyn initiation.   Patient will need to be counseled to avoid use of Benadryl while on Tikosyn and in the 2-3 days prior to Tikosyn initiation.

## 2023-07-14 NOTE — Patient Instructions (Incomplete)
 Severe persistent asthma: Your breathing test looks stable today Complete Full PFT as ordered by pulmonology Daily controller medication(s): Breztri 2 puffs twice a day with spacer and rinse mouth afterwards.  Samples given.  Continue Fasenra injection  Monitor wheezing episodes. May use albuterol rescue inhaler 2 puffs or nebulizer every 4 to 6 hours as needed for shortness of breath, chest tightness, coughing, and wheezing. May use albuterol rescue inhaler 2 puffs 5 to 15 minutes prior to strenuous physical activities. Monitor frequency of use - if you need to use it more than twice per week on a consistent basis let us know.  Asthma control goals:  Full participation in all desired activities (may need albuterol before activity) Albuterol use two times or less a week on average (not counting use with activity) Cough interfering with sleep two times or less a month Oral steroids no more than once a year No hospitalizations   Allergic rhinitis 2023 skin testing positive to mold, cockroach, dust mites.  Continue environmental control measures.  Use over the counter antihistamines such as Zyrtec (cetirizine), Claritin (loratadine), Allegra (fexofenadine), or Xyzal (levocetirizine) daily as needed. May take twice a day during allergy flares. May switch antihistamines every few months. Use Flonase (fluticasone) nasal spray 1-2 sprays per nostril once a day as needed for nasal congestion.  Nasal saline spray (i.e., Simply Saline) or nasal saline lavage (i.e., NeilMed) is recommended as needed and prior to medicated nasal sprays.  Reflux Continue Protonix 40mg  daily as before.   Follow up in 3 months or sooner if needed.   Continue to follow up with cardiology pulmonology    Control of House Dust Mite Allergen Dust mite allergens are a common trigger of allergy and asthma symptoms. While they can be found throughout the house, these microscopic creatures thrive in warm, humid environments  such as bedding, upholstered furniture and carpeting. Because so much time is spent in the bedroom, it is essential to reduce mite levels there.  Encase pillows, mattresses, and box springs in special allergen-proof fabric covers or airtight, zippered plastic covers.  Bedding should be washed weekly in hot water (130 F) and dried in a hot dryer. Allergen-proof covers are available for comforters and pillows that can't be regularly washed.  Wash the allergy-proof covers every few months. Minimize clutter in the bedroom. Keep pets out of the bedroom.  Keep humidity less than 50% by using a dehumidifier or air conditioning. You can buy a humidity measuring device called a hygrometer to monitor this.  If possible, replace carpets with hardwood, linoleum, or washable area rugs. If that's not possible, vacuum frequently with a vacuum that has a HEPA filter. Remove all upholstered furniture and non-washable window drapes from the bedroom. Remove all non-washable stuffed toys from the bedroom.  Wash stuffed toys weekly. Mold Control Mold and fungi can grow on a variety of surfaces provided certain temperature and moisture conditions exist.  Outdoor molds grow on plants, decaying vegetation and soil. The major outdoor mold, Alternaria and Cladosporium, are found in very high numbers during hot and dry conditions. Generally, a late summer - fall peak is seen for common outdoor fungal spores. Rain will temporarily lower outdoor mold spore count, but counts rise rapidly when the rainy period ends. The most important indoor molds are Aspergillus and Penicillium. Dark, humid and poorly ventilated basements are ideal sites for mold growth. The next most common sites of mold growth are the bathroom and the kitchen. Outdoor (Seasonal) Mold Control Use air  conditioning and keep windows closed. Avoid exposure to decaying vegetation. Avoid leaf raking. Avoid grain handling. Consider wearing a face mask if working in  moldy areas.  Indoor (Perennial) Mold Control  Maintain humidity below 50%. Get rid of mold growth on hard surfaces with water, detergent and, if necessary, 5% bleach (do not mix with other cleaners). Then dry the area completely. If mold covers an area more than 10 square feet, consider hiring an indoor environmental professional. For clothing, washing with soap and water is best. If moldy items cannot be cleaned and dried, throw them away. Remove sources e.g. contaminated carpets. Repair and seal leaking roofs or pipes. Using dehumidifiers in damp basements may be helpful, but empty the water and clean units regularly to prevent mildew from forming. All rooms, especially basements, bathrooms and kitchens, require ventilation and cleaning to deter mold and mildew growth. Avoid carpeting on concrete or damp floors, and storing items in damp areas. Cockroach Allergen Avoidance Cockroaches are often found in the homes of densely populated urban areas, schools or commercial buildings, but these creatures can lurk almost anywhere. This does not mean that you have a dirty house or living area. Block all areas where roaches can enter the home. This includes crevices, wall cracks and windows.  Cockroaches need water to survive, so fix and seal all leaky faucets and pipes. Have an exterminator go through the house when your family and pets are gone to eliminate any remaining roaches. Keep food in lidded containers and put pet food dishes away after your pets are done eating. Vacuum and sweep the floor after meals, and take out garbage and recyclables. Use lidded garbage containers in the kitchen. Wash dishes immediately after use and clean under stoves, refrigerators or toasters where crumbs can accumulate. Wipe off the stove and other kitchen surfaces and cupboards regularly.

## 2023-07-14 NOTE — Progress Notes (Addendum)
**Note Amy-Identified via Obfuscation**  Primary Care Physician: Amy Stokes, Amy Jansky, MD Primary Cardiologist: Sheryle Donning, MD Electrophysiologist: Will Cortland Ding, MD  Referring Physician: Slater Duncan NP   Georgiann Kirsch is a 73 y.o. female with a history of HTN, OSA, CAD/aortic atherosclerosis, asthma, atrial fibrillation who presents for follow up in the [facility].  The patient was initially diagnosed with atrial fibrillation in 2022 and has been maintained on flecainide . She wore a cardiac monitor 04/2023 which showed a 36% burden of afib. Patient does note intermittent symptoms of fatigue, SOB, and an unsettled feeling in her chest. Patient is on Eliquis  for stroke prevention. She is in SR today.    Today, she denies symptoms of palpitations, chest pain, orthopnea, PND, lower extremity edema, dizziness, presyncope, syncope, snoring, daytime somnolence, bleeding, or neurologic sequela. The patient is tolerating medications without difficulties and is otherwise without complaint today.    Atrial Fibrillation Risk Factors:  she does have symptoms or diagnosis of sleep apnea. she does not have a history of rheumatic fever.   Atrial Fibrillation Management history:  Previous antiarrhythmic drugs: flecainide  Previous cardioversions: none Previous ablations: none Anticoagulation history: Eliquis   ROS- All systems are reviewed and negative except as per the HPI above.  Past Medical History:  Diagnosis Date   Arthritis    no meds   Asthma    Edema    Fibroids    Hyperlipidemia    Hypertension    Obesity    RML pneumonia 02/25/2021   Sleep apnea    wears cpap    Thyroid  nodule 01/2009   left discovered on chest ct; needle bx "indeterminant" cannon r/o follicular neoplasm     Current Outpatient Medications  Medication Sig Dispense Refill   acetaminophen  (TYLENOL ) 650 MG CR tablet Take 1,300 mg by mouth every 8 (eight) hours as needed for pain.     albuterol   (VENTOLIN  HFA) 108 (90 Base) MCG/ACT inhaler Inhale 2 puffs into the lungs every 4 (four) hours as needed for wheezing or shortness of breath (coughing fits). 18 g 1   apixaban  (ELIQUIS ) 5 MG TABS tablet Take 1 tablet (5 mg total) by mouth 2 (two) times daily. 180 tablet 3   Budeson-Glycopyrrol-Formoterol  (BREZTRI  AEROSPHERE) 160-9-4.8 MCG/ACT AERO Inhale 2 puffs into the lungs in the morning and at bedtime. 10.7 g 11   Cholecalciferol 50 MCG (2000 UT) CAPS Take 1 capsule by mouth daily. PATIENT USING OIL AND NOT CAPSULES     diltiazem  (CARDIZEM  CD) 300 MG 24 hr capsule TAKE 1 CAPSULE BY MOUTH EVERY DAY 90 capsule 3   flecainide  (TAMBOCOR ) 50 MG tablet TAKE 1 TABLET BY MOUTH TWICE A DAY 180 tablet 2   FLUZONE HIGH-DOSE 0.5 ML injection      furosemide  (LASIX ) 20 MG tablet TAKE 20-40MG  AS DIRECTED BY CARDIOLOGY OFFICE. 60 tablet 2   ipratropium-albuterol  (DUONEB) 0.5-2.5 (3) MG/3ML SOLN Take 3 mLs by nebulization every 6 (six) hours as needed. 360 mL 0   levothyroxine  (SYNTHROID ) 100 MCG tablet TAKE 1 TABLET BY MOUTH EVERY MORNING ON AN EMPTY STOMACH Orally Once a day 90 days 90 tablet 4   losartan  (COZAAR ) 100 MG tablet TAKE 1 TABLET BY MOUTH EVERY DAY 90 tablet 1   Lutein 10 MG TABS Take 10 mg by mouth daily.     Magnesium  300 MG CAPS 1 capsule with a meal Orally Once a day     MAGNESIUM  CITRATE PO Take 250 mg by mouth.  MULTIPLE VITAMIN PO Take 1 tablet by mouth daily.     Omega-3 Fatty Acids (FISH OIL) 1000 MG CAPS      pantoprazole  (PROTONIX ) 40 MG tablet TAKE 1 TABLET BY MOUTH EVERY DAY 90 tablet 1   rosuvastatin  (CRESTOR ) 10 MG tablet TAKE 1 TABLET BY MOUTH EVERY DAY 90 tablet 2   Spacer/Aero-Holding Chambers DEVI Use with spacer 1 each 2   spironolactone  (ALDACTONE ) 25 MG tablet Take 1 tablet (25 mg total) by mouth daily. 90 tablet 3   valACYclovir  (VALTREX ) 500 MG tablet As needed     No current facility-administered medications for this encounter.    Physical Exam: BP 134/60    Pulse 72   Ht 5\' 7"  (1.702 m)   Wt 132.2 kg   BMI 45.64 kg/m   GEN: Well nourished, well developed in no acute distress CARDIAC: Regular rate and rhythm, no murmurs, rubs, gallops RESPIRATORY:  Clear to auscultation without rales, wheezing or rhonchi  ABDOMEN: Soft, non-tender, non-distended EXTREMITIES:  No edema; No deformity   Wt Readings from Last 3 Encounters:  07/14/23 132.2 kg  07/09/23 132.2 kg  06/10/23 133 kg     EKG today demonstrates  SR Vent. rate 72 BPM PR interval 190 ms QRS duration 84 ms QT/QTcB 398/435 ms   Echo 04/16/23 demonstrated   1. Left ventricular ejection fraction, by estimation, is 60 to 65%. The  left ventricle has normal function. The left ventricle has no regional  wall motion abnormalities. There is mild asymmetric left ventricular  hypertrophy of the inferior segment. Left ventricular diastolic parameters are indeterminate.   2. Right ventricular systolic function is normal. The right ventricular  size is moderately enlarged.   3. Left atrial size was mildly dilated.   4. The mitral valve is normal in structure. Mild mitral valve  regurgitation. No evidence of mitral stenosis.   5. The aortic valve is normal in structure. Aortic valve regurgitation is  not visualized. No aortic stenosis is present.   6. The inferior vena cava is normal in size with greater than 50%  respiratory variability, suggesting right atrial pressure of 3 mmHg.    CHA2DS2-VASc Score = 5  The patient's score is based upon: CHF History: 1 HTN History: 1 Diabetes History: 0 Stroke History: 0 Vascular Disease History: 1 Age Score: 1 Gender Score: 1       ASSESSMENT AND PLAN: Paroxysmal Atrial Fibrillation (ICD10:  I48.0) The patient's CHA2DS2-VASc score is 5, indicating a 7.2% annual risk of stroke.   Patient in SR today but had 36% burden on recent cardiac monitor. We discussed rhythm control options today including increasing flecainide  vs dofetilide. Given  the coronary artery calcifications noted on CT, would favor dofetilide loading, patient in agreement.  Patient would like to pursue dofetilide admission Continue Eliquis  5 mg BID No recent benadryl  use PharmD to screen medications for QT prolonging agents. QTc in SR 435 ms Patient due for bmet in two weeks, will add magnesium  Will have her discontinue flecainide  3 days prior to admission.  Continue diltiazem  300 mg daily  Secondary Hypercoagulable State (ICD10:  D68.69) The patient is at significant risk for stroke/thromboembolism based upon her CHA2DS2-VASc Score of 5.  Continue Apixaban  (Eliquis ).   High Risk Medication Monitoring (ICD 10: Z79.899) Intervals on ECG acceptable for flecainide  monitoring.      HTN Stable on current regimen  OSA  Encouraged nightly CPAP  CAD/aortic atherosclerosis  Noted on CT No anginal symptoms Followed by Dr  Christopher    Follow up in the AF clinic next month for dofetilide loading.       Myrtha Ates PA-C Afib Clinic [facility] [address] [place], [place] [address] [phone]

## 2023-07-15 ENCOUNTER — Ambulatory Visit: Admitting: Family

## 2023-07-15 ENCOUNTER — Ambulatory Visit: Payer: Medicare HMO | Admitting: Allergy

## 2023-07-15 ENCOUNTER — Encounter: Payer: Self-pay | Admitting: Family

## 2023-07-15 ENCOUNTER — Other Ambulatory Visit: Payer: Self-pay

## 2023-07-15 VITALS — BP 120/60 | HR 67 | Temp 98.1°F | Resp 16 | Ht 67.0 in | Wt 291.0 lb

## 2023-07-15 DIAGNOSIS — J4550 Severe persistent asthma, uncomplicated: Secondary | ICD-10-CM | POA: Diagnosis not present

## 2023-07-15 DIAGNOSIS — J3089 Other allergic rhinitis: Secondary | ICD-10-CM | POA: Diagnosis not present

## 2023-07-15 DIAGNOSIS — Z91038 Other insect allergy status: Secondary | ICD-10-CM

## 2023-07-15 DIAGNOSIS — K219 Gastro-esophageal reflux disease without esophagitis: Secondary | ICD-10-CM | POA: Diagnosis not present

## 2023-07-15 MED ORDER — ALBUTEROL SULFATE HFA 108 (90 BASE) MCG/ACT IN AERS: 2.0000 | INHALATION_SPRAY | RESPIRATORY_TRACT | 1 refills | Status: AC | PRN

## 2023-07-15 NOTE — Progress Notes (Signed)
 [address] [place] [address] Dept: [phone]  FOLLOW UP NOTE  Patient ID: Amy Stokes, female    DOB: 08/05/1950  Age: 73 y.o. MRN: [id number] Date of Office Visit: 07/15/2023  Assessment  Chief Complaint: Asthma (No issues ) and Allergic Rhinitis  (No issues )  HPI Amy Stokes is a 73 year old female who presents today for follow-up of severe persistent asthma , allergic rhinitis, and reflux.  She was last seen on March 18, 2023 by Dr. Selena Batten.  She denies any new diagnosis or surgery since her last office visit.  She does however mention that she has been dealing with shortness of breath, but that is due to her heart.  She has been in atrial fibrillation more frequently and will sometimes get fluid buildup for which she will increase her furosemide to 40 mg once a day.  She normally takes furosemide 20 mg once a day.  On May 12 she reports that she has been admitted to start a new medication.  She will be switching from flecainide to a medication that starts with the letter D.  This medication will help maintain her heart rhythm and is reported be less risky than increasing her flecainide.  She will need to be monitored closely for for her to get the right dosage.  She will be in the hospital for  4 days and 3 nights.  She mentions that they are potentially going to schedule her for a CT due to atherosclerosis found on a high-resolution CT chest.  This has not been scheduled yet.  They want to get her atrial fibrillation under control.  Severe persistent asthma: She continues to take Breztri 2 puffs twice a day with spacer, Tezspire injections every 4 weeks, and albuterol as needed.  She reports that she feels like she has her asthma under control and she feels like Tezspire helps.  She denies any problems or reactions with Tezspire injections.  She reports a cough, but not as much as she used to cough prior to starting Tezspire. It is rare if she wheezes.  She has shortness of  breath that she attributes to her A-fib.  She denies tightness in her chest, nocturnal awakenings due to breathing problems, fever and chills.  She uses her albuterol on average few times a month.  She mentions that a month ago her husband passed on an upper respiratory infection and she did not need any prescription drugs to help her get through this.  She just used her Mucinex and albuterol.  She does mention that her oxygen will drop off with exertion now and then, but rebounds quickly.  Her oxygen will drop in the 80s and then come back to the 90s.  She has discussed this with her pulmonologist and does have an upcoming full PFT scheduled on May 7.  She last saw pulmonology on June 10, 2022 and this visit shows:   "Bilateral lower extremity edema Concern for PH given risk factors, constellation of symptoms and RV strain on recent echo. DOE is disproportional to her asthma and lungs on clear on exam. She continues to have intermittent episodes of hypoxia on exertion noted at home but was able to maintain >89% on room air during walk test today. Encouraged her to continue monitoring and utilize to maintain levels >88-90%. Given her new oxygen requirements, progressive DOE, evidence of volume overload, and concern for PH, will set her up with Dr. Shirlee Latch for RHC for further evaluation. In interim,  she will continue lasix 20 mg daily with double dose if weight gain occurs. Pt agreeable to this plan and verbalized understanding.   Patient Instructions  Continue Albuterol inhaler 2 puffs or 3 mL neb every 6 hours as needed for shortness of breath or wheezing. Notify if symptoms persist despite rescue inhaler/neb use.  Continue Breztri 2 puffs Twice daily. Brush tongue and rinse mouth afterwards Continue fasenra injections as scheduled Ok to stay off oxygen with activity for now. Monitor and if above 88-90%, ok to keep O2 off.    Continue lasix 20 mg daily and increase to 40 mg daily for weight gain 2-3 lb  overnight or 5 lb in a week or worsening swelling. If you can't get the weight back down or swelling to decrease, call your heart doctor or me    Continue CPAP nightly with oxygen bled through 2 lpm  Continue to use CPAP every night, minimum of 4-6 hours a night.  Change equipment as directed. Wash your tubing with warm soap and water daily, hang to dry. Wash humidifier portion weekly. Use bottled, distilled water and change daily Be aware of reduced alertness and do not drive or operate heavy machinery if experiencing this or drowsiness.    I'm worried that your shortness of breath and oxygen requirements are coming from pulmonary hypertension. You have strain to the right side of your heart and this would explain the fluid overload you have had. We will set you up for a right heart catheterization with Dr. Shirlee Latch. Someone will contact you for scheduling. We will also have you complete a repeat lung function testing "  Also ,since her last office visit she had a high-resolution CT chest on June 15, 2023 showing:  "IMPRESSION: 1. No evidence of fibrotic interstitial lung disease. 2. Air trapping is indicative of small airways disease. 3. Bilateral lower lobe subsegmental volume loss with endobronchial debris. Difficult to exclude chronic aspiration. 4. 2.7 cm low-attenuation left thyroid nodule. Previous thyroid ultrasound 09/19/2021. No follow-up recommended unless clinically warranted. (Ref: J Am Coll Radiol. 2015 Feb;12(2): 143-50). 5. Aortic atherosclerosis (ICD10-I70.0). Coronary artery calcification. 6. Enlarged pulmonic trunk, indicative of pulmonary arterial hypertension."  Allergic rhinitis: She is currently not taking any histamines and is not using fluticasone nasal spray.  She denies rhinorrhea, nasal congestion, and postnasal drip.  She has not been treated for any sinus infections since we last saw her.  She reports that she will typically has not been bothered by seasonal  allergies.  She denies any known injury to her left ear.  She does mention that she will use Q-tips sometimes.  She denies any pain in her ear, drainage from her ear, fever or chills  Reflux: She reports that she spoke with Dr. Wynona Neat about stopping her Protonix and he told her that she could try stopping.  She reports that after stopping Protonix 40 mg daily she began getting heartburn so she started back on the Protonix.  She reports that her reflux is controlled while on the Protonix.  Drug Allergies:  No Known Allergies  Review of Systems: Negative except as per HPI   Physical Exam: BP 120/60 (BP Location: Left Arm, Patient Position: Sitting, Cuff Size: Large)   Pulse 67   Temp 98.1 F (36.7 C) (Temporal)   Resp 16   Ht 5\' 7"  (1.702 m)   Wt 291 lb (132 kg)   SpO2 96%   BMI 45.58 kg/m    Physical Exam Constitutional:  Appearance: Normal appearance.  HENT:     Head: Normocephalic and atraumatic.     Comments: Pharynx normal, eyes normal, ears: Right ear normal, left ear: Old blood noted in ear canal, nose normal    Right Ear: Tympanic membrane, ear canal and external ear normal.     Left Ear: Tympanic membrane and external ear normal.     Nose: Nose normal.     Mouth/Throat:     Mouth: Mucous membranes are moist.     Pharynx: Oropharynx is clear.  Eyes:     Conjunctiva/sclera: Conjunctivae normal.  Cardiovascular:     Rate and Rhythm: Regular rhythm.     Heart sounds: Normal heart sounds.  Pulmonary:     Effort: Pulmonary effort is normal.     Breath sounds: Normal breath sounds.     Comments: Lungs clear to auscultation Musculoskeletal:     Cervical back: Neck supple.  Skin:    General: Skin is warm.  Neurological:     Mental Status: She is alert and oriented to person, place, and time.  Psychiatric:        Mood and Affect: Mood normal.        Behavior: Behavior normal.        Thought Content: Thought content normal.        Judgment: Judgment normal.      Diagnostics: FVC 1.74 L (56%), FEV1/1.17 L (49%), FEV1/FVC 0.67.  Spirometry indicates possible moderate airway obstruction and possible mixed defect.  Spirometry is consistent with previous spirometry.  Assessment and Plan: 1. Allergy to cockroaches   2. Allergic rhinitis due to dust mite   3. Allergic rhinitis due to mold   4. Severe persistent asthma without complication   5. Gastroesophageal reflux disease, unspecified whether esophagitis present     No orders of the defined types were placed in this encounter.   Patient Instructions  Severe persistent asthma: Your breathing test looks stable today Complete Full PFT as ordered by pulmonology Daily controller medication(s): Breztri 2 puffs twice a day with spacer and rinse mouth afterwards.  Samples given.  Continue Fasenra injection  Monitor wheezing episodes. May use albuterol rescue inhaler 2 puffs or nebulizer every 4 to 6 hours as needed for shortness of breath, chest tightness, coughing, and wheezing. May use albuterol rescue inhaler 2 puffs 5 to 15 minutes prior to strenuous physical activities. Monitor frequency of use - if you need to use it more than twice per week on a consistent basis let us know.  Asthma control goals:  Full participation in all desired activities (may need albuterol before activity) Albuterol use two times or less a week on average (not counting use with activity) Cough interfering with sleep two times or less a month Oral steroids no more than once a year No hospitalizations   Allergic rhinitis 2023 skin testing positive to mold, cockroach, dust mites.  Continue environmental control measures.  Use over the counter antihistamines such as Zyrtec (cetirizine), Claritin (loratadine), Allegra (fexofenadine), or Xyzal (levocetirizine) daily as needed. May take twice a day during allergy flares. May switch antihistamines every few months. Use Flonase (fluticasone) nasal spray 1-2 sprays per  nostril once a day as needed for nasal congestion.  Nasal saline spray (i.e., Simply Saline) or nasal saline lavage (i.e., NeilMed) is recommended as needed and prior to medicated nasal sprays.  Reflux Continue Protonix 40mg  daily as before.   Follow up in 3 months or sooner if needed.   Continue to follow  up with cardiology pulmonology    Control of House Dust Mite Allergen Dust mite allergens are a common trigger of allergy and asthma symptoms. While they can be found throughout the house, these microscopic creatures thrive in warm, humid environments such as bedding, upholstered furniture and carpeting. Because so much time is spent in the bedroom, it is essential to reduce mite levels there.  Encase pillows, mattresses, and box springs in special allergen-proof fabric covers or airtight, zippered plastic covers.  Bedding should be washed weekly in hot water (130 F) and dried in a hot dryer. Allergen-proof covers are available for comforters and pillows that can't be regularly washed.  Wash the allergy-proof covers every few months. Minimize clutter in the bedroom. Keep pets out of the bedroom.  Keep humidity less than 50% by using a dehumidifier or air conditioning. You can buy a humidity measuring device called a hygrometer to monitor this.  If possible, replace carpets with hardwood, linoleum, or washable area rugs. If that's not possible, vacuum frequently with a vacuum that has a HEPA filter. Remove all upholstered furniture and non-washable window drapes from the bedroom. Remove all non-washable stuffed toys from the bedroom.  Wash stuffed toys weekly. Mold Control Mold and fungi can grow on a variety of surfaces provided certain temperature and moisture conditions exist.  Outdoor molds grow on plants, decaying vegetation and soil. The major outdoor mold, Alternaria and Cladosporium, are found in very high numbers during hot and dry conditions. Generally, a late summer - fall peak is  seen for common outdoor fungal spores. Rain will temporarily lower outdoor mold spore count, but counts rise rapidly when the rainy period ends. The most important indoor molds are Aspergillus and Penicillium. Dark, humid and poorly ventilated basements are ideal sites for mold growth. The next most common sites of mold growth are the bathroom and the kitchen. Outdoor (Seasonal) Mold Control Use air conditioning and keep windows closed. Avoid exposure to decaying vegetation. Avoid leaf raking. Avoid grain handling. Consider wearing a face mask if working in moldy areas.  Indoor (Perennial) Mold Control  Maintain humidity below 50%. Get rid of mold growth on hard surfaces with water, detergent and, if necessary, 5% bleach (do not mix with other cleaners). Then dry the area completely. If mold covers an area more than 10 square feet, consider hiring an indoor environmental professional. For clothing, washing with soap and water is best. If moldy items cannot be cleaned and dried, throw them away. Remove sources e.g. contaminated carpets. Repair and seal leaking roofs or pipes. Using dehumidifiers in damp basements may be helpful, but empty the water and clean units regularly to prevent mildew from forming. All rooms, especially basements, bathrooms and kitchens, require ventilation and cleaning to deter mold and mildew growth. Avoid carpeting on concrete or damp floors, and storing items in damp areas. Cockroach Allergen Avoidance Cockroaches are often found in the homes of densely populated urban areas, schools or commercial buildings, but these creatures can lurk almost anywhere. This does not mean that you have a dirty house or living area. Block all areas where roaches can enter the home. This includes crevices, wall cracks and windows.  Cockroaches need water to survive, so fix and seal all leaky faucets and pipes. Have an exterminator go through the house when your family and pets are gone to  eliminate any remaining roaches. Keep food in lidded containers and put pet food dishes away after your pets are done eating. Vacuum and sweep the  floor after meals, and take out garbage and recyclables. Use lidded garbage containers in the kitchen. Wash dishes immediately after use and clean under stoves, refrigerators or toasters where crumbs can accumulate. Wipe off the stove and other kitchen surfaces and cupboards regularly.  Return in about 3 months (around 10/14/2023).    Thank you for the opportunity to care for this patient.  Please do not hesitate to contact me with questions.  Tinnie Forehand, FNP [facility] 

## 2023-07-16 ENCOUNTER — Telehealth (HOSPITAL_COMMUNITY): Payer: Self-pay

## 2023-07-16 NOTE — Telephone Encounter (Signed)
 Initiated prior authorization for Tikosyn admission. Date of service: 08/10/23 Pending Authorization # [id number]  Fax # [phone] Faxed clinical information.

## 2023-07-17 DIAGNOSIS — I480 Paroxysmal atrial fibrillation: Secondary | ICD-10-CM

## 2023-07-17 NOTE — Addendum Note (Signed)
 Encounter addended by: Twana Gal, PA on: 07/17/2023 2:30 PM  Actions taken: Clinical Note Signed

## 2023-07-18 ENCOUNTER — Other Ambulatory Visit (HOSPITAL_BASED_OUTPATIENT_CLINIC_OR_DEPARTMENT_OTHER): Payer: Self-pay | Admitting: Cardiology

## 2023-07-18 DIAGNOSIS — I480 Paroxysmal atrial fibrillation: Secondary | ICD-10-CM

## 2023-07-20 ENCOUNTER — Telehealth (HOSPITAL_COMMUNITY): Payer: Self-pay

## 2023-07-20 NOTE — Telephone Encounter (Signed)
 Prescription refill request for Eliquis  received. Indication:afib Last office visit:4/25 Scr:0.54  2/25 Age: 73 Weight:132  kg  Prescription refilled

## 2023-07-20 NOTE — Telephone Encounter (Signed)
 Tikosyn admission has been approved. Date of service: 08/10/2023 Authorization # [id number]

## 2023-07-23 ENCOUNTER — Other Ambulatory Visit (HOSPITAL_COMMUNITY): Payer: Self-pay | Admitting: Physician Assistant

## 2023-07-23 ENCOUNTER — Other Ambulatory Visit: Payer: Self-pay | Admitting: Family

## 2023-07-23 DIAGNOSIS — I4891 Unspecified atrial fibrillation: Secondary | ICD-10-CM | POA: Diagnosis not present

## 2023-07-23 DIAGNOSIS — E876 Hypokalemia: Secondary | ICD-10-CM | POA: Diagnosis not present

## 2023-07-24 ENCOUNTER — Encounter (HOSPITAL_BASED_OUTPATIENT_CLINIC_OR_DEPARTMENT_OTHER): Payer: Self-pay

## 2023-07-24 LAB — BASIC METABOLIC PANEL WITH GFR
BUN/Creatinine Ratio: 25 (ref 12–28)
BUN: 19 mg/dL (ref 8–27)
CO2: 30 mmol/L — ABNORMAL HIGH (ref 20–29)
Calcium: 9.4 mg/dL (ref 8.7–10.3)
Chloride: 96 mmol/L (ref 96–106)
Creatinine, Ser: 0.75 mg/dL (ref 0.57–1.00)
Glucose: 95 mg/dL (ref 70–99)
Potassium: 4.1 mmol/L (ref 3.5–5.2)
Sodium: 142 mmol/L (ref 134–144)
eGFR: 85 mL/min/{1.73_m2} (ref >59)

## 2023-07-24 LAB — MAGNESIUM: Magnesium: 2.1 mg/dL (ref 1.6–2.3)

## 2023-07-26 ENCOUNTER — Other Ambulatory Visit (HOSPITAL_BASED_OUTPATIENT_CLINIC_OR_DEPARTMENT_OTHER): Payer: Self-pay | Admitting: Family

## 2023-07-26 DIAGNOSIS — R600 Localized edema: Secondary | ICD-10-CM

## 2023-07-31 ENCOUNTER — Telehealth (HOSPITAL_COMMUNITY): Payer: Self-pay | Admitting: Pharmacy Technician

## 2023-07-31 ENCOUNTER — Other Ambulatory Visit (HOSPITAL_COMMUNITY): Payer: Self-pay

## 2023-07-31 NOTE — Telephone Encounter (Signed)
 Patient Product/process development scientist completed.    The patient is insured through [employer]. Patient has Medicare and is not eligible for a copay card, but may be able to apply for patient assistance or Medicare RX Payment Plan (Patient Must reach out to their plan, if eligible for payment plan), if available.    Ran test claim for diofetilide (Tikosyn) 500 mcg and the current 30 day co-pay is $94.39.   This test claim was processed through [facility]- copay amounts may vary at other pharmacies due to pharmacy/plan contracts, or as the patient moves through the different stages of their insurance plan.     Morgan Arab, CPHT Pharmacy Technician III Certified Patient Advocate [facility] Patient Advocate Team Direct Number: [phone]  Fax: [phone]

## 2023-07-31 NOTE — Telephone Encounter (Signed)
 Patient aware to stop flecainide  3 days prior to admission.

## 2023-08-03 ENCOUNTER — Ambulatory Visit: Admitting: Student

## 2023-08-05 ENCOUNTER — Ambulatory Visit: Admitting: Pulmonary Disease

## 2023-08-05 ENCOUNTER — Encounter: Payer: Self-pay | Admitting: Pulmonary Disease

## 2023-08-05 VITALS — BP 127/67 | HR 76 | Ht 67.0 in | Wt 289.0 lb

## 2023-08-05 DIAGNOSIS — Z87891 Personal history of nicotine dependence: Secondary | ICD-10-CM | POA: Diagnosis not present

## 2023-08-05 DIAGNOSIS — Z6841 Body Mass Index (BMI) 40.0 and over, adult: Secondary | ICD-10-CM | POA: Diagnosis not present

## 2023-08-05 DIAGNOSIS — J9611 Chronic respiratory failure with hypoxia: Secondary | ICD-10-CM

## 2023-08-05 DIAGNOSIS — Z9981 Dependence on supplemental oxygen: Secondary | ICD-10-CM

## 2023-08-05 DIAGNOSIS — G4733 Obstructive sleep apnea (adult) (pediatric): Secondary | ICD-10-CM

## 2023-08-05 DIAGNOSIS — J45909 Unspecified asthma, uncomplicated: Secondary | ICD-10-CM

## 2023-08-05 DIAGNOSIS — R0609 Other forms of dyspnea: Secondary | ICD-10-CM

## 2023-08-05 DIAGNOSIS — J4550 Severe persistent asthma, uncomplicated: Secondary | ICD-10-CM

## 2023-08-05 LAB — PULMONARY FUNCTION TEST
DL/VA % pred: 146 %
DL/VA: 5.87 ml/min/mmHg/L
DLCO cor % pred: 93 %
DLCO cor: 20.53 ml/min/mmHg
DLCO unc % pred: 93 %
DLCO unc: 20.53 ml/min/mmHg
FEF 25-75 Post: 1.12 L/s
FEF 25-75 Pre: 0.7 L/s
FEF2575-%Change-Post: 59 %
FEF2575-%Pred-Post: 54 %
FEF2575-%Pred-Pre: 34 %
FEV1-%Change-Post: 11 %
FEV1-%Pred-Post: 55 %
FEV1-%Pred-Pre: 49 %
FEV1-Post: 1.44 L
FEV1-Pre: 1.29 L
FEV1FVC-%Change-Post: 0 %
FEV1FVC-%Pred-Pre: 88 %
FEV6-%Change-Post: 9 %
FEV6-%Pred-Post: 64 %
FEV6-%Pred-Pre: 59 %
FEV6-Post: 2.13 L
FEV6-Pre: 1.94 L
FEV6FVC-%Change-Post: 0 %
FEV6FVC-%Pred-Post: 103 %
FEV6FVC-%Pred-Pre: 104 %
FVC-%Change-Post: 10 %
FVC-%Pred-Post: 62 %
FVC-%Pred-Pre: 56 %
FVC-Post: 2.14 L
FVC-Pre: 1.94 L
Post FEV1/FVC ratio: 67 %
Post FEV6/FVC ratio: 99 %
Pre FEV1/FVC ratio: 67 %
Pre FEV6/FVC Ratio: 100 %
RV % pred: 279 %
RV: 6.79 L
TLC % pred: 160 %
TLC: 9.06 L

## 2023-08-05 NOTE — Patient Instructions (Addendum)
 Continue Breztri   Continue your Fasenra   Continue albuterol  as needed  Continue to try and stay as active as you can  Your CPAP download shows it is working well  Your breathing study does show obstructive disease-your inhalers are still the only way to manage this  Your CAT scan does show evidence of air trapping  Call us  with significant concerns  Follow-up about 3 months from here

## 2023-08-05 NOTE — Progress Notes (Signed)
 Amy Stokes    [id number]    [birth year]  Primary Care Physician:de [place], Alonza Jansky, MD  Referring Physician: de Peru, Alonza Jansky, MD [address] [place],  [place] [address]  Chief complaint:   Known obstructive sleep apnea Breathing remains stable  HPI:  Tolerating CPAP well with no significant problems with CPAP  Recent evaluation for shortness of breath - Remains on Breztri  Elevated IgE and eosinophils - On Fasenra   Does exercise on a regular basis  Activity level maintained  Reformed smoker quit in 1973 No pertinent occupational history  Has not been wheezing  Doing relatively well   Outpatient Encounter Medications as of 08/05/2023  Medication Sig   acetaminophen  (TYLENOL ) 650 MG CR tablet Take 1,300 mg by mouth every 8 (eight) hours as needed for pain.   albuterol  (VENTOLIN  HFA) 108 (90 Base) MCG/ACT inhaler Inhale 2 puffs into the lungs every 4 (four) hours as needed for wheezing or shortness of breath (coughing fits).   Budeson-Glycopyrrol-Formoterol  (BREZTRI  AEROSPHERE) 160-9-4.8 MCG/ACT AERO Inhale 2 puffs into the lungs in the morning and at bedtime.   Cholecalciferol 50 MCG (2000 UT) CAPS Take 1 capsule by mouth daily. PATIENT USING OIL AND NOT CAPSULES   diltiazem  (CARDIZEM  CD) 300 MG 24 hr capsule TAKE 1 CAPSULE BY MOUTH EVERY DAY   ELIQUIS  5 MG TABS tablet TAKE 1 TABLET BY MOUTH TWICE A DAY   flecainide  (TAMBOCOR ) 50 MG tablet TAKE 1 TABLET BY MOUTH TWICE A DAY   FLUZONE HIGH-DOSE 0.5 ML injection    furosemide  (LASIX ) 20 MG tablet TAKE 20-40MG  AS DIRECTED BY CARDIOLOGY OFFICE.   ipratropium-albuterol  (DUONEB) 0.5-2.5 (3) MG/3ML SOLN Take 3 mLs by nebulization every 6 (six) hours as needed.   levothyroxine  (SYNTHROID ) 100 MCG tablet TAKE 1 TABLET BY MOUTH EVERY MORNING ON AN EMPTY STOMACH Orally Once a day 90 days   losartan  (COZAAR ) 100 MG tablet TAKE 1 TABLET BY MOUTH EVERY DAY   Lutein 10 MG TABS Take 10 mg by mouth daily.    Magnesium  300 MG CAPS 1 capsule with a meal Orally Once a day   MAGNESIUM  CITRATE PO Take 250 mg by mouth.   MULTIPLE VITAMIN PO Take 1 tablet by mouth daily.   Omega-3 Fatty Acids (FISH OIL) 1000 MG CAPS    pantoprazole  (PROTONIX ) 40 MG tablet TAKE 1 TABLET BY MOUTH EVERY DAY   rosuvastatin  (CRESTOR ) 10 MG tablet TAKE 1 TABLET BY MOUTH EVERY DAY   Spacer/Aero-Holding Chambers DEVI Use with spacer   spironolactone  (ALDACTONE ) 25 MG tablet Take 1 tablet (25 mg total) by mouth daily.   valACYclovir  (VALTREX ) 500 MG tablet As needed   No facility-administered encounter medications on file as of 08/05/2023.    Allergies as of 08/05/2023   (No Known Allergies)    Past Medical History:  Diagnosis Date   Arthritis    no meds   Asthma    Edema    Fibroids    Hyperlipidemia    Hypertension    Obesity    RML pneumonia 02/25/2021   Sleep apnea    wears cpap    Thyroid  nodule 01/2009   left discovered on chest ct; needle bx "indeterminant" cannon r/o follicular neoplasm     Past Surgical History:  Procedure Laterality Date   ABDOMINAL HYSTERECTOMY Bilateral 08/02/2013   Procedure: TOTAL ABDOMINAL HYSTERECTOMY WITH BILATERAL SALPINGO-OOPHORECTOMY ;  Surgeon: Davia Erps, MD;  Location: [facility];  Service: Gynecology;  Laterality: Bilateral;    Dr. Sharie Dauphin to assist.  Dr. Lynnell Sato to follow with an umbilical hernia repair. He will need an additional 1 1/2 hours.    COLONOSCOPY  12/27/2007   [facility]    HERNIA REPAIR     MOUTH SURGERY     thyroid  needle bx     TONSILLECTOMY     TOTAL ABDOMINAL HYSTERECTOMY Bilateral    UMBILICAL HERNIA REPAIR N/A 08/02/2013   Procedure: HERNIA REPAIR UMBILICAL ADULT WITH MESH;  Surgeon: Harlee Lichtenstein, MD;  Location: [facility];  Service: General;  Laterality: N/A;    Family History  Problem Relation Age of Onset   Arthritis Mother    Other Mother        neurological disorder   Pancreatic cancer Mother    Heart disease Father     Thyroid  nodules Sister    Heart disease Brother        x2 1 brother deceased   Colon cancer Neg Hx    Colon polyps Neg Hx    Esophageal cancer Neg Hx    Rectal cancer Neg Hx    Stomach cancer Neg Hx    BRCA 1/2 Neg Hx    Breast cancer Neg Hx     Social History   Socioeconomic History   Marital status: Married    Spouse name: Not on file   Number of children: 0   Years of education: Not on file   Highest education level: Associate degree: academic program  Occupational History   Occupation: retired  Tobacco Use   Smoking status: Former    Current packs/day: 0.00    Average packs/day: 1 pack/day for 1 year (1.0 ttl pk-yrs)    Types: Cigarettes    Start date: 04/28/1970    Quit date: 04/29/1971    Years since quitting: 52.3    Passive exposure: Past   Smokeless tobacco: Never   Tobacco comments:    Former smoker 07/14/23  Vaping Use   Vaping status: Never Used  Substance and Sexual Activity   Alcohol use: Not Currently    Comment: occasionally   Drug use: No   Sexual activity: Not on file  Other Topics Concern   Not on file  Social History Narrative   Married   Occupation: Advertising account executive currently unemployed   Married   Regular exercise-no   HH of 2   Pet cat      Social Drivers of Corporate investment banker Strain: Low Risk  (04/04/2023)   Overall Financial Resource Strain (CARDIA)    Difficulty of Paying Living Expenses: Not very hard  Food Insecurity: No Food Insecurity (04/04/2023)   Hunger Vital Sign    Worried About Running Out of Food in the Last Year: Never true    Ran Out of Food in the Last Year: Never true  Transportation Needs: No Transportation Needs (04/04/2023)   PRAPARE - Administrator, Civil Service (Medical): No    Lack of Transportation (Non-Medical): No  Physical Activity: Sufficiently Active (04/04/2023)   Exercise Vital Sign    Days of Exercise per Week: 3 days    Minutes of Exercise per Session: 60 min  Stress: No Stress  Concern Present (04/04/2023)   [employer] of Occupational Health - Occupational Stress Questionnaire    Feeling of Stress : Only a little  Social Connections: Moderately Integrated (04/04/2023)   Social Connection and Isolation Panel [NHANES]    Frequency of Communication with Friends and  Family: Three times a week    Frequency of Social Gatherings with Friends and Family: Twice a week    Attends Religious Services: Never    Database administrator or Organizations: No    Attends Engineer, structural: More than 4 times per year    Marital Status: Married  Catering manager Violence: Not At Risk (07/23/2022)   Humiliation, Afraid, Rape, and Kick questionnaire    Fear of Current or Ex-Partner: No    Emotionally Abused: No    Physically Abused: No    Sexually Abused: No    Review of Systems  Constitutional:  Negative for fatigue.  HENT: Negative.    Eyes: Negative.   Respiratory: Negative.  Negative for shortness of breath.   Cardiovascular: Negative.  Negative for leg swelling.  Gastrointestinal: Negative.   Endocrine: Negative.   Genitourinary: Negative.     Vitals:   08/05/23 1301  BP: (!) 136/42  Pulse: 76  SpO2: 96%     Physical Exam Constitutional:      Appearance: She is well-developed. She is obese.  HENT:     Head: Normocephalic and atraumatic.     Mouth/Throat:     Mouth: Mucous membranes are moist.  Eyes:     General:        Right eye: No discharge.        Left eye: No discharge.     Conjunctiva/sclera: Conjunctivae normal.  Neck:     Thyroid : No thyromegaly.     Trachea: No tracheal deviation.  Cardiovascular:     Rate and Rhythm: Normal rate and regular rhythm.  Pulmonary:     Effort: Pulmonary effort is normal. No respiratory distress.     Breath sounds: No stridor. No wheezing or rhonchi.  Musculoskeletal:     Cervical back: No rigidity or tenderness.  Neurological:     Mental Status: She is alert.  Psychiatric:        Mood and  Affect: Mood normal.       05/03/2018    2:00 PM  Results of the Epworth flowsheet  Sitting and reading 1  Watching TV 1  Sitting, inactive in a public place (e.g. a theatre or a meeting) 0  As a passenger in a car for an hour without a break 0  Lying down to rest in the afternoon when circumstances permit 0  Sitting and talking to someone 0  Sitting quietly after a lunch without alcohol 0  In a car, while stopped for a few minutes in traffic 0  Total score 2    Data Reviewed:   IgE level of 2005 Eosinophil 2900  PFT with moderate obstruction with no significant bronchodilator response Lung volumes suggest air trapping with normal diffusing capacity  CPAP download shows excellent compliance at 100% Average use of 8 hours 6 minutes AutoSet 4-68 with 95 percentile pressure of 8.9 residual AHI of 1.9  Assessment:  Obstructive sleep apnea -Stable symptoms - Compliance download shows good control of events  Class III obesity - Has remained active  Asthma/obstructive lung disease - Continue Breztri  - Continue Fasenra  for allergies  Shortness of breath/wheezing - Continue bronchodilator treatments  Class III obesity - Continue weight loss efforts   Plan/Recommendations:  .  Continue CPAP  .  Continue Breztri   .  Albuterol  as needed  .  Graded activities as tolerated  .  Follow-up in 3 months  Myer Artis MD [facility] 08/05/2023, 1:04 PM  CC: de [place], Alonza Jansky, MD

## 2023-08-05 NOTE — Progress Notes (Signed)
 Full PFT performed today.

## 2023-08-05 NOTE — Patient Instructions (Signed)
 Full PFT performed today.

## 2023-08-07 ENCOUNTER — Encounter (HOSPITAL_COMMUNITY): Payer: Self-pay

## 2023-08-10 ENCOUNTER — Inpatient Hospital Stay (HOSPITAL_COMMUNITY): Admit: 2023-08-10

## 2023-08-10 ENCOUNTER — Ambulatory Visit (HOSPITAL_COMMUNITY)
Admission: RE | Admit: 2023-08-10 | Discharge: 2023-08-10 | Disposition: A | Discharge status: Home / Self Care | Source: Ambulatory Visit | Attending: Physician Assistant | Admitting: Physician Assistant

## 2023-08-10 DIAGNOSIS — I480 Paroxysmal atrial fibrillation: Secondary | ICD-10-CM | POA: Diagnosis not present

## 2023-08-10 DIAGNOSIS — D6869 Other thrombophilia: Secondary | ICD-10-CM

## 2023-08-10 NOTE — Patient Instructions (Addendum)
 Resume Eliquis  - Do not miss any doses going forward - if you should please let us  know    On May 30th - STOP Flecainide 

## 2023-08-10 NOTE — Progress Notes (Signed)
 Patient presents for dofetilide admission. Unfortunately, she inadvertently stopped her Eliquis  and continued her flecainide . Will cancel today's admission and reschedule after 3 weeks of uninterrupted anticoagulation.   ECG today shows:  Afib Vent. rate 66 BPM PR interval * ms QRS duration 80 ms QT/QTcB 416/436 ms

## 2023-08-12 ENCOUNTER — Telehealth (HOSPITAL_COMMUNITY): Payer: Self-pay

## 2023-08-12 NOTE — Telephone Encounter (Signed)
 Date of service has changed for T[facility]os[facility] admission. New Date: 09/01/2023 Contacted [employer] with new date of service. Authorization # [id number]

## 2023-08-13 ENCOUNTER — Other Ambulatory Visit (HOSPITAL_BASED_OUTPATIENT_CLINIC_OR_DEPARTMENT_OTHER): Payer: Self-pay | Admitting: *Deleted

## 2023-08-13 ENCOUNTER — Encounter (HOSPITAL_BASED_OUTPATIENT_CLINIC_OR_DEPARTMENT_OTHER): Payer: Self-pay | Admitting: Family Medicine

## 2023-08-13 DIAGNOSIS — J9601 Acute respiratory failure with hypoxia: Secondary | ICD-10-CM | POA: Diagnosis not present

## 2023-08-13 MED ORDER — LOSARTAN POTASSIUM 100 MG PO TABS: 100.0000 mg | ORAL_TABLET | Freq: Every day | ORAL | 1 refills | Status: DC

## 2023-08-15 ENCOUNTER — Emergency Department (HOSPITAL_BASED_OUTPATIENT_CLINIC_OR_DEPARTMENT_OTHER)
Admission: EM | Admit: 2023-08-15 | Discharge: 2023-08-15 | Disposition: A | Discharge status: Home / Self Care | Attending: Emergency Medicine | Admitting: Emergency Medicine

## 2023-08-15 ENCOUNTER — Encounter (HOSPITAL_BASED_OUTPATIENT_CLINIC_OR_DEPARTMENT_OTHER): Payer: Self-pay | Admitting: Emergency Medicine

## 2023-08-15 ENCOUNTER — Emergency Department (HOSPITAL_BASED_OUTPATIENT_CLINIC_OR_DEPARTMENT_OTHER)

## 2023-08-15 ENCOUNTER — Other Ambulatory Visit: Payer: Self-pay

## 2023-08-15 DIAGNOSIS — Z7951 Long term (current) use of inhaled steroids: Secondary | ICD-10-CM | POA: Diagnosis not present

## 2023-08-15 DIAGNOSIS — Z79899 Other long term (current) drug therapy: Secondary | ICD-10-CM | POA: Diagnosis not present

## 2023-08-15 DIAGNOSIS — S32018A Other fracture of first lumbar vertebra, initial encounter for closed fracture: Secondary | ICD-10-CM | POA: Diagnosis not present

## 2023-08-15 DIAGNOSIS — Z87891 Personal history of nicotine dependence: Secondary | ICD-10-CM | POA: Insufficient documentation

## 2023-08-15 DIAGNOSIS — Z7901 Long term (current) use of anticoagulants: Secondary | ICD-10-CM | POA: Diagnosis not present

## 2023-08-15 DIAGNOSIS — M47812 Spondylosis without myelopathy or radiculopathy, cervical region: Secondary | ICD-10-CM | POA: Diagnosis not present

## 2023-08-15 DIAGNOSIS — S32028A Other fracture of second lumbar vertebra, initial encounter for closed fracture: Secondary | ICD-10-CM | POA: Insufficient documentation

## 2023-08-15 DIAGNOSIS — S0990XA Unspecified injury of head, initial encounter: Secondary | ICD-10-CM | POA: Insufficient documentation

## 2023-08-15 DIAGNOSIS — I700 Atherosclerosis of aorta: Secondary | ICD-10-CM | POA: Diagnosis not present

## 2023-08-15 DIAGNOSIS — J45909 Unspecified asthma, uncomplicated: Secondary | ICD-10-CM | POA: Insufficient documentation

## 2023-08-15 DIAGNOSIS — R270 Ataxia, unspecified: Secondary | ICD-10-CM | POA: Diagnosis not present

## 2023-08-15 DIAGNOSIS — M4802 Spinal stenosis, cervical region: Secondary | ICD-10-CM | POA: Diagnosis not present

## 2023-08-15 DIAGNOSIS — W07XXXA Fall from chair, initial encounter: Secondary | ICD-10-CM | POA: Insufficient documentation

## 2023-08-15 DIAGNOSIS — I10 Essential (primary) hypertension: Secondary | ICD-10-CM | POA: Insufficient documentation

## 2023-08-15 DIAGNOSIS — S199XXA Unspecified injury of neck, initial encounter: Secondary | ICD-10-CM | POA: Diagnosis not present

## 2023-08-15 DIAGNOSIS — I6782 Cerebral ischemia: Secondary | ICD-10-CM | POA: Diagnosis not present

## 2023-08-15 DIAGNOSIS — S32009A Unspecified fracture of unspecified lumbar vertebra, initial encounter for closed fracture: Secondary | ICD-10-CM

## 2023-08-15 DIAGNOSIS — R609 Edema, unspecified: Secondary | ICD-10-CM | POA: Diagnosis not present

## 2023-08-15 DIAGNOSIS — M47816 Spondylosis without myelopathy or radiculopathy, lumbar region: Secondary | ICD-10-CM | POA: Diagnosis not present

## 2023-08-15 DIAGNOSIS — M545 Low back pain, unspecified: Secondary | ICD-10-CM | POA: Diagnosis not present

## 2023-08-15 MED ORDER — OXYCODONE HCL 5 MG PO TABS
5.0000 mg | ORAL_TABLET | Freq: Once | ORAL | Status: AC
  Administered 2023-08-15: 5 mg via ORAL
  Filled 2023-08-15: qty 1

## 2023-08-15 MED ORDER — LIDOCAINE 4 % EX PTCH: 1.0000 | MEDICATED_PATCH | CUTANEOUS | 0 refills | Status: DC

## 2023-08-15 MED ORDER — OXYCODONE-ACETAMINOPHEN 5-325 MG PO TABS: 1.0000 | ORAL_TABLET | Freq: Four times a day (QID) | ORAL | 0 refills | Status: DC | PRN

## 2023-08-15 MED ORDER — ACETAMINOPHEN 500 MG PO TABS
1000.0000 mg | ORAL_TABLET | Freq: Once | ORAL | Status: AC
  Administered 2023-08-15: 1000 mg via ORAL
  Filled 2023-08-15: qty 2

## 2023-08-15 NOTE — Discharge Instructions (Addendum)
 Your head and neck CTs were normal.  You do have a fracture of your transverse processes in your L2 and L3 vertebrae.  These fractures heal on their own and do not require any surgery.  Please follow-up with your primary care doctor.  I have sent you medications to your pharmacy.  You can apply a lidocaine  patch to the area, take 1000 mg of Tylenol  every 8 hours.  You can also take 5 mg of oxycodone  as needed every 6 hours.

## 2023-08-15 NOTE — ED Notes (Signed)
Reviewed discharge instructions, medications, and home care with pt. Pt verbalized understanding and had no further questions. Pt exited ED without complications.

## 2023-08-15 NOTE — ED Triage Notes (Signed)
 Pt brought immediately to room 6. Pt reports she was sitting in a collapsible chair, ground was unsteady and her chair fell backwards. Her back scraped against a brick firepit. Lower back pain. Did not hit head.   On eliquis  for afib

## 2023-08-15 NOTE — ED Triage Notes (Signed)
 1000 mg tylenol  taken 315 pm today

## 2023-08-15 NOTE — ED Notes (Signed)
 Patient transported to CT

## 2023-08-15 NOTE — ED Notes (Signed)
 ED Provider at bedside.

## 2023-08-15 NOTE — ED Provider Notes (Signed)
 [place] EMERGENCY DEPARTMENT AT [facility] Provider Note  CSN: [id number] Arrival date & time: 08/15/23 1559  Chief Complaint(s) No chief complaint on file.  HPI This is a pleasant 73 year old female who is here today after she was [place], tipped over backward and struck her lower back on a brick fireplace.  She is on Eliquis , denies striking her head or losing consciousness.  She has been ambulatory since.   Past Medical History Past Medical History:  Diagnosis Date   Arthritis    no meds   Asthma    Edema    Fibroids    Hyperlipidemia    Hypertension    Obesity    RML pneumonia 02/25/2021   Sleep apnea    wears cpap    Thyroid  nodule 01/2009   left discovered on chest ct; needle bx "indeterminant" cannon r/o follicular neoplasm    Patient Active Problem List   Diagnosis Date Noted   Paroxysmal atrial fibrillation (HCC) 07/14/2023   Hypercoagulable state due to paroxysmal atrial fibrillation (HCC) 07/14/2023   DOE (dyspnea on exertion) 06/11/2023   Volume overload 04/24/2023   Left knee pain 04/08/2023   Severe persistent asthma 01/08/2022   Gastroesophageal reflux disease 01/08/2022   Other allergic rhinitis 07/29/2021   History of eczema as a child 07/29/2021   Elevated IgE level 07/05/2021   Upper airway cough syndrome 06/04/2021   Bilateral lower extremity edema 06/04/2021   Atrial fibrillation, chronic (HCC) 02/27/2021   Hypothyroidism 02/27/2021   Hypokalemia 02/27/2021   Chronic respiratory failure with hypoxia (HCC) 02/26/2021   Cough 08/09/2015   Iliotibial band syndrome affecting left lower leg 05/26/2014   Arthritis of left lower extremity 05/26/2014   Low HDL (under 40) 04/08/2014   Thyroid  nodule 02/12/2014   OSA (obstructive sleep apnea) 12/01/2013   Seroma complicating a procedure 08/18/2013   Bleeding gums 06/09/2011   Allergy  01/31/2010   MORBID OBESITY 07/16/2009   Multinodular goiter  (nontoxic) 01/13/2008   RECTAL BLEEDING 11/22/2007   UNS ADVRS EFF UNS RX MEDICINAL&BIOLOGICAL SBSTNC 07/26/2007   HYPERLIPIDEMIA 04/06/2007   Essential hypertension 03/05/2007   TACHYCARDIA 03/05/2007   Moderate persistent asthma without complication 12/28/2006   Home Medication(s) Prior to Admission medications   Medication Sig Start Date End Date Taking? Authorizing Provider  lidocaine  4 % Place 1 patch onto the skin daily for 20 days. 08/15/23 09/04/23 Yes Afton Horse T, DO  oxyCODONE -acetaminophen  (PERCOCET/ROXICET) 5-325 MG tablet Take 1 tablet by mouth every 6 (six) hours as needed for severe pain (pain score 7-10). 08/15/23  Yes Afton Horse T, DO  acetaminophen  (TYLENOL ) 650 MG CR tablet Take 1,300 mg by mouth every 8 (eight) hours as needed for pain.    [provider]  albuterol  (VENTOLIN  HFA) 108 (90 Base) MCG/ACT inhaler Inhale 2 puffs into the lungs every 4 (four) hours as needed for wheezing or shortness of breath (coughing fits). 07/15/23   Tinnie Forehand, FNP  Budeson-Glycopyrrol-Formoterol  (BREZTRI  AEROSPHERE) 160-9-4.8 MCG/ACT AERO Inhale 2 puffs into the lungs in the morning and at bedtime. 03/06/23   Trudy Fusi, DO  Cholecalciferol 50 MCG (2000 UT) CAPS Take 1 capsule by mouth daily. PATIENT USING OIL AND NOT CAPSULES    [provider]  diltiazem  (CARDIZEM  CD) 300 MG 24 hr capsule TAKE 1 CAPSULE BY MOUTH EVERY DAY 11/05/22   Sheryle Donning, MD  ELIQUIS  5 MG TABS tablet TAKE 1 TABLET BY MOUTH TWICE A DAY 07/20/23  Sheryle Donning, MD  flecainide  (TAMBOCOR ) 50 MG tablet TAKE 1 TABLET BY MOUTH TWICE A DAY Patient not taking: Reported on 08/10/2023 02/02/23   Sheryle Donning, MD  FLUZONE HIGH-DOSE 0.5 ML injection  03/04/23   [provider]  furosemide  (LASIX ) 20 MG tablet TAKE 20-40MG  AS DIRECTED BY CARDIOLOGY OFFICE. 07/27/23   Swinyer, Leilani Punter, NP  ipratropium-albuterol  (DUONEB) 0.5-2.5 (3) MG/3ML SOLN Take 3 mLs by  nebulization every 6 (six) hours as needed. 02/28/21   Kraig Peru, MD  levothyroxine  (SYNTHROID ) 100 MCG tablet TAKE 1 TABLET BY MOUTH EVERY MORNING ON AN EMPTY STOMACH Orally Once a day 90 days 04/16/21     losartan  (COZAAR ) 100 MG tablet Take 1 tablet (100 mg total) by mouth daily. 08/13/23   de Peru, Alonza Jansky, MD  Lutein 10 MG TABS Take 10 mg by mouth daily.    [provider]  Magnesium  300 MG CAPS 1 capsule with a meal Orally Once a day    [provider]  MAGNESIUM  CITRATE PO Take 250 mg by mouth.    [provider]  MULTIPLE VITAMIN PO Take 1 tablet by mouth daily.    [provider]  Omega-3 Fatty Acids (FISH OIL) 1000 MG CAPS  11/03/22   [provider]  pantoprazole  (PROTONIX ) 40 MG tablet TAKE 1 TABLET BY MOUTH EVERY DAY 07/12/23   Myer Artis A, MD  rosuvastatin  (CRESTOR ) 10 MG tablet TAKE 1 TABLET BY MOUTH EVERY DAY 07/13/23   Walker, Caitlin S, NP  Spacer/Aero-Holding Chambers DEVI Use with spacer 06/04/21   Cobb, Katherine V, NP  spironolactone  (ALDACTONE ) 25 MG tablet Take 1 tablet (25 mg total) by mouth daily. 07/09/23 10/07/23  Swinyer, Leilani Punter, NP  valACYclovir  (VALTREX ) 500 MG tablet As needed 04/08/21   [provider]                                                                                                                                    Past Surgical History Past Surgical History:  Procedure Laterality Date   ABDOMINAL HYSTERECTOMY Bilateral 08/02/2013   Procedure: TOTAL ABDOMINAL HYSTERECTOMY WITH BILATERAL SALPINGO-OOPHORECTOMY ;  Surgeon: Davia Erps, MD;  Location: [facility];  Service: Gynecology;  Laterality: Bilateral;    Dr. Sharie Dauphin to assist.  Dr. Lynnell Sato to follow with an umbilical hernia repair. He will need an additional 1 1/2 hours.    COLONOSCOPY  12/27/2007   [facility]    HERNIA REPAIR     MOUTH SURGERY     thyroid  needle bx     TONSILLECTOMY     TOTAL ABDOMINAL HYSTERECTOMY  Bilateral    UMBILICAL HERNIA REPAIR N/A 08/02/2013   Procedure: HERNIA REPAIR UMBILICAL ADULT WITH MESH;  Surgeon: Harlee Lichtenstein, MD;  Location: [facility];  Service: General;  Laterality: N/A;   Family History Family History  Problem Relation Age of Onset   Arthritis Mother  Other Mother        neurological disorder   Pancreatic cancer Mother    Heart disease Father    Thyroid  nodules Sister    Heart disease Brother        x2 1 brother deceased   Colon cancer Neg Hx    Colon polyps Neg Hx    Esophageal cancer Neg Hx    Rectal cancer Neg Hx    Stomach cancer Neg Hx    BRCA 1/2 Neg Hx    Breast cancer Neg Hx     Social History Social History   Tobacco Use   Smoking status: Former    Current packs/day: 0.00    Average packs/day: 1 pack/day for 1 year (1.0 ttl pk-yrs)    Types: Cigarettes    Start date: 04/28/1970    Quit date: 04/29/1971    Years since quitting: 52.3    Passive exposure: Past   Smokeless tobacco: Never   Tobacco comments:    Former smoker 07/14/23  Vaping Use   Vaping status: Never Used  Substance Use Topics   Alcohol use: Not Currently    Comment: occasionally   Drug use: No   Allergies Patient has no known allergies.  Review of Systems Review of Systems  Physical Exam Vital Signs  I have reviewed the triage vital signs BP (!) 141/55   Pulse 71   Temp 98.3 F (36.8 C) (Oral)   Resp 16   SpO2 94%   Physical Exam Vitals reviewed.  HENT:     Head: Normocephalic and atraumatic.  Cardiovascular:     Rate and Rhythm: Normal rate.  Musculoskeletal:     Cervical back: Normal range of motion and neck supple.     Comments: Small abrasion to the L4-L5 midline.  Neurological:     General: No focal deficit present.     Mental Status: She is alert.     Cranial Nerves: No cranial nerve deficit.     Motor: No weakness.     Gait: Gait normal.     ED Results and Treatments Labs (all labs ordered are listed, but only abnormal results are  displayed) Labs Reviewed - No data to display                                                                                                                        Radiology CT Lumbar Spine Wo Contrast Result Date: 08/15/2023 CLINICAL DATA:  Marvell Slider backwards, lower back pain EXAM: CT LUMBAR SPINE WITHOUT CONTRAST TECHNIQUE: Multidetector CT imaging of the lumbar spine was performed without intravenous contrast administration. Multiplanar CT image reconstructions were also generated. RADIATION DOSE REDUCTION: This exam was performed according to the departmental dose-optimization program which includes automated exposure control, adjustment of the mA and/or kV according to patient size and/or use of iterative reconstruction technique. COMPARISON:  None Available. FINDINGS: Segmentation: There are 6 non-rib-bearing lumbar type vertebral bodies, with bilateral sacralization of the L6 vertebral body. Alignment: Alignment  is anatomic. Vertebrae: There are displaced fractures of the right L2 and L3 transverse processes. No other acute bony abnormalities. No destructive bony lesions. Paraspinal and other soft tissues: Subcutaneous edema within the right mid back consistent with trauma and contusion. No fluid collection or hematoma. The paraspinal soft tissues are unremarkable. Atherosclerosis throughout the aorta and its branches. Disc levels: There is multilevel spondylosis and facet hypertrophy from L2-3 through L5-6, greatest at the L4-5 and L5-6 levels. Reconstructed images demonstrate no additional findings. IMPRESSION: 1. Acute fractures of the right L2 and L3 transverse processes. 2. Minimal subcutaneous fat stranding right mid back consistent with contusion. No fluid collection or hematoma. 3. Multilevel spondylosis and facet hypertrophy as above. 4.  Aortic Atherosclerosis (ICD10-I70.0). Electronically Signed   By: Bobbye Burrow M.D.   On: 08/15/2023 17:09   CT Cervical Spine Wo Contrast Result Date:  08/15/2023 CLINICAL DATA:  Ataxia, cervical trauma EXAM: CT CERVICAL SPINE WITHOUT CONTRAST TECHNIQUE: Multidetector CT imaging of the cervical spine was performed without intravenous contrast. Multiplanar CT image reconstructions were also generated. RADIATION DOSE REDUCTION: This exam was performed according to the departmental dose-optimization program which includes automated exposure control, adjustment of the mA and/or kV according to patient size and/or use of iterative reconstruction technique. COMPARISON:  None Available. FINDINGS: Alignment: No traumatic subluxation. Grade 1 degenerative anterolisthesis of C3 on C4. Skull base and vertebrae: No acute fracture. Vertebral body heights are maintained. The dens and skull base are intact. Soft tissues and spinal canal: No prevertebral fluid or swelling. No visible canal hematoma. Disc levels: Disc space narrowing and spurring most prominently affects C6-C7. Upper chest: No acute findings. Other: Hypodense left thyroid  nodule. This has been evaluated on previous imaging. (ref: J Am Coll Radiol. 2015 Feb;12(2): 143-50).Thyroid  ultrasound 09/19/2021 IMPRESSION: Degenerative change in the cervical spine without acute fracture or subluxation. Electronically Signed   By: Chadwick Colonel M.D.   On: 08/15/2023 17:05   CT Head Wo Contrast Result Date: 08/15/2023 CLINICAL DATA:  Ataxia, head trauma Fall backwards from chair. EXAM: CT HEAD WITHOUT CONTRAST TECHNIQUE: Contiguous axial images were obtained from the base of the skull through the vertex without intravenous contrast. RADIATION DOSE REDUCTION: This exam was performed according to the departmental dose-optimization program which includes automated exposure control, adjustment of the mA and/or kV according to patient size and/or use of iterative reconstruction technique. COMPARISON:  None Available. FINDINGS: Brain: No intracranial hemorrhage, mass effect, or midline shift. Normal for age atrophy. No  hydrocephalus. The basilar cisterns are patent. Mild periventricular and deep white matter hypodensity typical of chronic small vessel ischemia no evidence of territorial infarct or acute ischemia. No extra-axial or intracranial fluid collection. Vascular: Atherosclerosis of skullbase vasculature without hyperdense vessel or abnormal calcification. Skull: No fracture or focal lesion.  Mild frontal hyperostosis. Sinuses/Orbits: No acute finding. Other: Calcified right frontal sebaceous cyst. IMPRESSION: 1. No acute intracranial abnormality. No skull fracture. 2. Mild chronic small vessel ischemia and atrophy. Electronically Signed   By: Chadwick Colonel M.D.   On: 08/15/2023 17:02    Pertinent labs & imaging results that were available during my care of the patient were reviewed by me and considered in my medical decision making (see MDM for details).  Medications Ordered in ED Medications  acetaminophen  (TYLENOL ) tablet 1,000 mg (1,000 mg Oral Given 08/15/23 1633)  oxyCODONE  (Oxy IR/ROXICODONE ) immediate release tablet 5 mg (5 mg Oral Given 08/15/23 1633)  Procedures Procedures  (including critical care time)  Medical Decision Making / ED Course   This patient presents to the ED for concern of fall, this involves an extensive number of treatment options, and is a complaint that carries with it a high risk of complications and morbidity.  The differential diagnosis includes lower back contusion, lower back fracture, less likely spinal cord injury, less likely ICH, less likely C-spine injury.  Less likely intra-abdominal injury.  MDM: On exam, patient overall well-appearing.  She has normal vital signs.  Patient has a soft abdomen.  There is no tenderness in the head or neck.  No traumatic injuries identified to head or neck.  Given that the patient is on Eliquis , out  of abundance of caution will obtain imaging.  Will obtain imaging of patient's lumbar spine.  She has no neurological deficits.  Normal gait.    Reassessment 5:20 PM-patient has acute L2-L3 transverse process fractures.  These are weightbearing as tolerated, do not require any neurosurgical intervention.  Will discharge patient with analgesia, PCP follow-up.   Additional history obtained: -Additional history obtained from husband at bedside -External records from outside source obtained and reviewed including: Chart review including previous notes, labs, imaging, consultation notes   Lab Tests: -I ordered, reviewed, and interpreted labs.   The pertinent results include:   Labs Reviewed - No data to display       Imaging Studies ordered: I ordered imaging studies including CT imaging of the head and neck and lumbar spine I independently visualized and interpreted imaging. I agree with the radiologist interpretation   Medicines ordered and prescription drug management: Meds ordered this encounter  Medications   acetaminophen  (TYLENOL ) tablet 1,000 mg   oxyCODONE  (Oxy IR/ROXICODONE ) immediate release tablet 5 mg    Refill:  0   oxyCODONE -acetaminophen  (PERCOCET/ROXICET) 5-325 MG tablet    Sig: Take 1 tablet by mouth every 6 (six) hours as needed for severe pain (pain score 7-10).    Dispense:  12 tablet    Refill:  0   lidocaine  4 %    Sig: Place 1 patch onto the skin daily for 20 days.    Dispense:  20 patch    Refill:  0    -I have reviewed the patients home medicines and have made adjustments as needed   Cardiac Monitoring: The patient was maintained on a cardiac monitor.  I personally viewed and interpreted the cardiac monitored which showed an underlying rhythm of: Normal sinus rhythm  Social Determinants of Health:  Factors impacting patients care include: Multiple medical comorbidities including anticoagulated status   Reevaluation: After the interventions noted  above, I reevaluated the patient and found that they have :improved  Co morbidities that complicate the patient evaluation  Past Medical History:  Diagnosis Date   Arthritis    no meds   Asthma    Edema    Fibroids    Hyperlipidemia    Hypertension    Obesity    RML pneumonia 02/25/2021   Sleep apnea    wears cpap    Thyroid  nodule 01/2009   left discovered on chest ct; needle bx "indeterminant" cannon r/o follicular neoplasm       Dispostion: I considered admission for this patient, however the patient is appropriate for outpatient follow-up     Final Clinical Impression(s) / ED Diagnoses Final diagnoses:  Closed fracture of transverse process of lumbar vertebra, initial encounter (HCC)     @PCDICTATION @    Afton Horse  T, DO 08/15/23 1727

## 2023-08-17 ENCOUNTER — Telehealth (HOSPITAL_BASED_OUTPATIENT_CLINIC_OR_DEPARTMENT_OTHER): Payer: Self-pay | Admitting: *Deleted

## 2023-08-17 NOTE — Telephone Encounter (Signed)
 Copied from CRM [id number]. Topic: Appointments - Scheduling Inquiry for Clinic >> Aug 17, 2023  9:42 AM Donald Frost wrote: Reason for CRM: The patient had a fall this weekend and was seen at [facility] and discharged. She needs to be scheduled for a hospital follow up within 2 weeks but there is nothing available. Please assist patient further.

## 2023-08-17 NOTE — Telephone Encounter (Signed)
 Patient scheduled for 5/23 with dr de Peru for ED follow up

## 2023-08-21 ENCOUNTER — Ambulatory Visit (HOSPITAL_BASED_OUTPATIENT_CLINIC_OR_DEPARTMENT_OTHER): Admitting: Family Medicine

## 2023-08-21 ENCOUNTER — Encounter (HOSPITAL_BASED_OUTPATIENT_CLINIC_OR_DEPARTMENT_OTHER): Payer: Self-pay | Admitting: Family Medicine

## 2023-08-21 VITALS — BP 133/77 | HR 72 | Ht 67.0 in | Wt 284.2 lb

## 2023-08-21 DIAGNOSIS — S32009A Unspecified fracture of unspecified lumbar vertebra, initial encounter for closed fracture: Secondary | ICD-10-CM | POA: Insufficient documentation

## 2023-08-21 NOTE — Progress Notes (Unsigned)
    Procedures performed today:    None.  Independent interpretation of notes and tests performed by another provider:   None.  Brief History, Exam, Impression, and Recommendations:    BP 133/77   Pulse 72   Ht 5\' 7"  (1.702 m)   Wt 284 lb 3.2 oz (128.9 kg)   SpO2 96%   BMI 44.51 kg/m   There are no diagnoses linked to this encounter.No follow-ups on file.   ___________________________________________ Amy Tolin de Peru, MD, ABFM, [facility]

## 2023-08-25 NOTE — Assessment & Plan Note (Signed)
 Patient seen in emergency department recently after falling backward out of chair and hitting her lower back on a brick fireplace.  At time of evaluation, she did have imaging completed which showed acute fractures of L2 and L3 transverse processes.  She also had chronic changes noted within lumbar spine.  In the emergency department, she did receive oxycodone  and Tylenol .  She indicates that she did have some side effects from narcotic medication.  Currently pain is well-controlled.  She was prescribed narcotic medication on discharge from the emergency department, however reports that she never did take this due to side effects she experienced in the hospital.  She has been taking it easy.  Denies any new symptoms such as radiation of pain, numbness or tingling, gait changes. On exam, patient is in no acute distress, vital signs stable.  She does have some mild tenderness in the lumbar spine region, more so along right side.  Normal gait in office. Given fractures involved, imaging results, discussed general conservative measures.  She may continue with activities as tolerated.  We discussed gradual progression over the next 6 to 8 weeks with her activities.  Discussed general limitations early on.  Advised on warning signs for which patient should return to office sooner for further evaluation.

## 2023-08-28 ENCOUNTER — Encounter (HOSPITAL_COMMUNITY): Payer: Self-pay

## 2023-09-01 ENCOUNTER — Ambulatory Visit (HOSPITAL_COMMUNITY)
Admission: RE | Admit: 2023-09-01 | Discharge: 2023-09-01 | Disposition: A | Discharge status: Home / Self Care | Source: Ambulatory Visit | Attending: Internal Medicine | Admitting: Internal Medicine

## 2023-09-01 ENCOUNTER — Encounter (HOSPITAL_COMMUNITY): Payer: Self-pay | Admitting: Cardiovascular Disease

## 2023-09-01 ENCOUNTER — Ambulatory Visit (HOSPITAL_COMMUNITY): Payer: Self-pay | Admitting: Internal Medicine

## 2023-09-01 ENCOUNTER — Other Ambulatory Visit (HOSPITAL_BASED_OUTPATIENT_CLINIC_OR_DEPARTMENT_OTHER): Payer: Self-pay

## 2023-09-01 ENCOUNTER — Other Ambulatory Visit: Payer: Self-pay

## 2023-09-01 ENCOUNTER — Inpatient Hospital Stay (HOSPITAL_COMMUNITY)
Admission: AD | Admit: 2023-09-01 | Discharge: 2023-09-04 | DRG: 309 | Disposition: A | Discharge status: Home / Self Care | Attending: Cardiology | Admitting: Cardiology

## 2023-09-01 ENCOUNTER — Other Ambulatory Visit (HOSPITAL_COMMUNITY): Payer: Self-pay

## 2023-09-01 VITALS — BP 124/64 | HR 79 | Ht 67.0 in | Wt 287.4 lb

## 2023-09-01 DIAGNOSIS — I700 Atherosclerosis of aorta: Secondary | ICD-10-CM | POA: Diagnosis present

## 2023-09-01 DIAGNOSIS — I4891 Unspecified atrial fibrillation: Secondary | ICD-10-CM

## 2023-09-01 DIAGNOSIS — D6869 Other thrombophilia: Secondary | ICD-10-CM | POA: Diagnosis not present

## 2023-09-01 DIAGNOSIS — E66813 Obesity, class 3: Secondary | ICD-10-CM | POA: Diagnosis not present

## 2023-09-01 DIAGNOSIS — G4733 Obstructive sleep apnea (adult) (pediatric): Secondary | ICD-10-CM | POA: Diagnosis not present

## 2023-09-01 DIAGNOSIS — Z6841 Body Mass Index (BMI) 40.0 and over, adult: Secondary | ICD-10-CM

## 2023-09-01 DIAGNOSIS — Z7901 Long term (current) use of anticoagulants: Secondary | ICD-10-CM | POA: Diagnosis not present

## 2023-09-01 DIAGNOSIS — I10 Essential (primary) hypertension: Secondary | ICD-10-CM | POA: Diagnosis not present

## 2023-09-01 DIAGNOSIS — J45909 Unspecified asthma, uncomplicated: Secondary | ICD-10-CM | POA: Diagnosis present

## 2023-09-01 DIAGNOSIS — Z79899 Other long term (current) drug therapy: Secondary | ICD-10-CM

## 2023-09-01 DIAGNOSIS — E785 Hyperlipidemia, unspecified: Secondary | ICD-10-CM | POA: Diagnosis not present

## 2023-09-01 DIAGNOSIS — I480 Paroxysmal atrial fibrillation: Secondary | ICD-10-CM | POA: Diagnosis not present

## 2023-09-01 DIAGNOSIS — I2510 Atherosclerotic heart disease of native coronary artery without angina pectoris: Secondary | ICD-10-CM | POA: Diagnosis present

## 2023-09-01 LAB — BASIC METABOLIC PANEL WITH GFR
BUN/Creatinine Ratio: 26 (ref 12–28)
BUN: 20 mg/dL (ref 8–27)
CO2: 27 mmol/L (ref 20–29)
Calcium: 8.9 mg/dL (ref 8.7–10.3)
Chloride: 98 mmol/L (ref 96–106)
Creatinine, Ser: 0.78 mg/dL (ref 0.57–1.00)
Glucose: 79 mg/dL (ref 70–99)
Potassium: 4.4 mmol/L (ref 3.5–5.2)
Sodium: 140 mmol/L (ref 134–144)
eGFR: 81 mL/min/{1.73_m2} (ref >59)

## 2023-09-01 LAB — MAGNESIUM: Magnesium: 2.1 mg/dL (ref 1.6–2.3)

## 2023-09-01 MED ORDER — LOSARTAN POTASSIUM 50 MG PO TABS
100.0000 mg | ORAL_TABLET | Freq: Every day | ORAL | Status: DC
  Administered 2023-09-01 – 2023-09-03 (×3): 100 mg via ORAL
  Filled 2023-09-01 (×3): qty 2

## 2023-09-01 MED ORDER — LEVOTHYROXINE SODIUM 100 MCG PO TABS
100.0000 ug | ORAL_TABLET | Freq: Every day | ORAL | Status: DC
  Administered 2023-09-02 – 2023-09-04 (×3): 100 ug via ORAL
  Filled 2023-09-01 (×3): qty 1

## 2023-09-01 MED ORDER — ROSUVASTATIN CALCIUM 5 MG PO TABS
10.0000 mg | ORAL_TABLET | Freq: Every day | ORAL | Status: DC
  Administered 2023-09-01 – 2023-09-03 (×3): 10 mg via ORAL
  Filled 2023-09-01 (×3): qty 2

## 2023-09-01 MED ORDER — PANTOPRAZOLE SODIUM 40 MG PO TBEC
40.0000 mg | DELAYED_RELEASE_TABLET | Freq: Every day | ORAL | Status: DC
  Administered 2023-09-02 – 2023-09-04 (×3): 40 mg via ORAL
  Filled 2023-09-01 (×3): qty 1

## 2023-09-01 MED ORDER — ACETAMINOPHEN 500 MG PO TABS: 1000.0000 mg | ORAL_TABLET | Freq: Four times a day (QID) | ORAL | Status: DC | PRN

## 2023-09-01 MED ORDER — SPIRONOLACTONE 25 MG PO TABS
25.0000 mg | ORAL_TABLET | Freq: Every day | ORAL | Status: DC
  Administered 2023-09-02 – 2023-09-04 (×3): 25 mg via ORAL
  Filled 2023-09-01 (×3): qty 1

## 2023-09-01 MED ORDER — SODIUM CHLORIDE 0.9 % IV SOLN: 250.0000 mL | INTRAVENOUS | Status: AC | PRN

## 2023-09-01 MED ORDER — FUROSEMIDE 20 MG PO TABS
20.0000 mg | ORAL_TABLET | Freq: Every day | ORAL | Status: DC
  Administered 2023-09-02 – 2023-09-04 (×3): 20 mg via ORAL
  Filled 2023-09-01 (×3): qty 1

## 2023-09-01 MED ORDER — LUTEIN 10 MG PO TABS: 10.0000 mg | ORAL_TABLET | Freq: Every day | ORAL | Status: DC

## 2023-09-01 MED ORDER — ALBUTEROL SULFATE (2.5 MG/3ML) 0.083% IN NEBU: 2.5000 mg | INHALATION_SOLUTION | RESPIRATORY_TRACT | Status: DC | PRN

## 2023-09-01 MED ORDER — DOFETILIDE 500 MCG PO CAPS
ORAL_CAPSULE | ORAL | 0 refills | Status: DC
Start: 2023-09-01 — End: 2023-09-01
  Filled 2023-09-01: qty 60, 30d supply, fill #0

## 2023-09-01 MED ORDER — APIXABAN 5 MG PO TABS
5.0000 mg | ORAL_TABLET | Freq: Two times a day (BID) | ORAL | Status: DC
  Administered 2023-09-01 – 2023-09-04 (×6): 5 mg via ORAL
  Filled 2023-09-01: qty 1
  Filled 2023-09-01: qty 2
  Filled 2023-09-01 (×4): qty 1

## 2023-09-01 MED ORDER — SODIUM CHLORIDE 0.9% FLUSH
3.0000 mL | Freq: Two times a day (BID) | INTRAVENOUS | Status: DC
  Administered 2023-09-02 – 2023-09-04 (×5): 3 mL via INTRAVENOUS

## 2023-09-01 MED ORDER — DOFETILIDE 500 MCG PO CAPS
500.0000 ug | ORAL_CAPSULE | Freq: Two times a day (BID) | ORAL | Status: DC
  Administered 2023-09-01 – 2023-09-02 (×2): 500 ug via ORAL
  Filled 2023-09-01 (×2): qty 1

## 2023-09-01 MED ORDER — SODIUM CHLORIDE 0.9% FLUSH: 3.0000 mL | INTRAVENOUS | Status: DC | PRN

## 2023-09-01 MED ORDER — DILTIAZEM HCL ER COATED BEADS 180 MG PO CP24
300.0000 mg | ORAL_CAPSULE | Freq: Every day | ORAL | Status: DC
  Administered 2023-09-02 – 2023-09-04 (×3): 300 mg via ORAL
  Filled 2023-09-01 (×3): qty 1

## 2023-09-01 MED ORDER — IPRATROPIUM-ALBUTEROL 0.5-2.5 (3) MG/3ML IN SOLN: 3.0000 mL | Freq: Four times a day (QID) | RESPIRATORY_TRACT | Status: DC | PRN

## 2023-09-01 MED ORDER — BUDESON-GLYCOPYRROL-FORMOTEROL 160-9-4.8 MCG/ACT IN AERO
2.0000 | INHALATION_SPRAY | Freq: Two times a day (BID) | RESPIRATORY_TRACT | Status: DC
  Administered 2023-09-01 – 2023-09-04 (×6): 2 via RESPIRATORY_TRACT
  Filled 2023-09-01: qty 5.9

## 2023-09-01 NOTE — Progress Notes (Signed)
 Pharmacy: Dofetilide (Tikosyn) - Initial Consult Assessment and Electrolyte Replacement  Pharmacy consulted to assist in monitoring and replacing electrolytes in this 73 y.o. female admitted on 09/01/2023 undergoing dofetilide initiation. First dofetilide dose: 500 mcg BID 6/3 >  Assessment:  Patient Exclusion Criteria: If any screening criteria checked as "Yes", then  patient  should NOT receive dofetilide until criteria item is corrected.  If "Yes" please indicate correction plan.  YES  NO Patient  Exclusion Criteria Correction Plan/Comments   []   [x]   Baseline QTc interval is greater than or equal to 440 msec. IF above YES box checked dofetilide contraindicated unless patient has ICD; then may proceed if QTc 500-550 msec or with known ventricular conduction abnormalities may proceed with QTc 550-600 msec. QTc = 435 ms per EP    []   [x]   Patient is known or suspected to have a digoxin level greater than 2 ng/ml: No results found for: "DIGOXIN"     []   [x]   Creatinine clearance less than 20 ml/min (calculated using Cockcroft-Gault, actual body weight and serum creatinine): CrCl 139 mL/min     []   [x]  Patient has received drugs known to prolong the QT intervals within the last 48 hour (examples: phenothiazines, tricyclics or tetracyclic antidepressants, macrolides, 1st generation H-1 antihistamines (especially diphenhydramine ), fluoroquinolones, azoles, ondansetron , metoclopramide , promethazine).   Updated information on QT prolonging agents is available to be searched on the following database:QT prolonging agents -If SSRI or antihistamine needed, preferred options are sertraline and loratadine respectively     []   [x]  Patient received a dose of a thiazide diuretic in the last 48 hours [including hydrochlorothiazide  (Oretic ) alone or in any combination including triamterene (Dyazide, Maxzide)].    []   [x]  Patient received a medication known to increase dofetilide plasma  concentrations prior to initial dofetilide dose:  Trimethoprim (Primsol, Proloprim) in the last 36 hours Verapamil (Calan, Verelan) in the last 36 hours or a sustained release dose in the last 72 hours Megestrol  (Megace ) in the last 5 days  Cimetidine (Tagamet) in the last 6 hours Ketoconazole (Nizoral) in the last 24 hours Itraconazole (Sporanox) in the last 48 hours  Prochlorperazine (Compazine) in the last 36 hours     []   [x]   Patient is known to have a history of torsades de pointes; congenital or acquired long QT syndromes.    []   [x]   Patient has received a Class 1 and Class 3 antiarrhythmic with less than 2 half-lives since last dose. (Disopyramide, Quinidine, Procainamide, Lidocaine , Mexiletine, Flecainide , Propafenone, Sotalol, Dronedarone)    []   [x]   Patient has received amiodarone therapy in the past 3 months or amiodarone level is greater than 0.3 ng/ml.    Labs:    Component Value Date/Time   K 4.4 09/01/2023 0000   MG 2.1 09/01/2023 0000     Plan: Select One Calculated CrCl  Dose q12h  [x]  > 60 ml/min 500 mcg  []  40-60 ml/min 250 mcg  []  20-40 ml/min 125 mcg   [x]   Physician selected initial dose within range recommended for patients level of renal function - will monitor for response.  [x]   Physician selected initial dose outside of range recommended for patients level of renal function - will discuss if the dose should be altered at this time.   Patient has been appropriately anticoagulated with Eliquis .  Potassium: K >/= 4: Appropriate to initiate Tikosyn, no replacement needed    Magnesium : Mg >2: Appropriate to initiate Tikosyn, no replacement needed  Thank you for allowing pharmacy to participate in this patient's care   Cecillia Cogan, PharmD Clinical Pharmacist 09/01/2023  2:41 PM

## 2023-09-01 NOTE — H&P (Addendum)
 Primary Care Physician: de Peru, Alonza Jansky, MD Primary Cardiologist: Sheryle Donning, MD Electrophysiologist: Will Cortland Ding, MD  Referring Physician: Slater Duncan NP   Amy Stokes is a 73 y.o. female with a history of HTN, OSA, CAD/aortic atherosclerosis, asthma, atrial fibrillation who presents for follow up in the [facility].  The patient was initially diagnosed with atrial fibrillation in 2022 and has been maintained on flecainide . She wore a cardiac monitor 04/2023 which showed a 36% burden of afib. Patient does note intermittent symptoms of fatigue, SOB, and an unsettled feeling in her chest. Patient is on Eliquis  for stroke prevention.   On follow up 09/01/23, patient is here for Tikosyn admission. She stopped Eliquis  and resumed flecainide  on 5/12, prompting rescheduling of admission after 3 weeks of uninterrupted anticoagulation and discontinued flecainide  greater than 3 days prior to admission. No new medications since last OV. No benadryl  use. No missed doses of anticoagulant. She notes to have episode of Afib yesterday for a few hours.  Today, she denies symptoms of palpitations, chest pain, orthopnea, PND, lower extremity edema, dizziness, presyncope, syncope, snoring, daytime somnolence, bleeding, or neurologic sequela. The patient is tolerating medications without difficulties and is otherwise without complaint today.    Atrial Fibrillation Risk Factors:  she does have symptoms or diagnosis of sleep apnea. she does not have a history of rheumatic fever.   Atrial Fibrillation Management history:  Previous antiarrhythmic drugs: flecainide  Previous cardioversions: none Previous ablations: none Anticoagulation history: Eliquis   ROS- All systems are reviewed and negative except as per the HPI above.  Past Medical History:  Diagnosis Date   Arthritis    no meds   Asthma    Edema    Fibroids    Hyperlipidemia     Hypertension    Obesity    RML pneumonia 02/25/2021   Sleep apnea    wears cpap    Thyroid  nodule 01/2009   left discovered on chest ct; needle bx "indeterminant" cannon r/o follicular neoplasm     Current Facility-Administered Medications  Medication Dose Route Frequency Provider Last Rate Last Admin   0.9 %  sodium chloride  infusion  250 mL Intravenous PRN Mealor, Augustus E, MD       acetaminophen  (TYLENOL ) tablet 1,000 mg  1,000 mg Oral Q6H PRN Nathanel Bal, PA-C       apixaban  (ELIQUIS ) tablet 5 mg  5 mg Oral BID Mealor, Augustus E, MD       [START ON 09/02/2023] diltiazem  (CARDIZEM  CD) 24 hr capsule 300 mg  300 mg Oral Daily Nathanel Bal, PA-C       dofetilide (TIKOSYN) capsule 500 mcg  500 mcg Oral BID Mealor, Augustus E, MD       [START ON 09/02/2023] furosemide  (LASIX ) tablet 20 mg  20 mg Oral Daily Suarez, Joseph J, PA-C       [START ON 09/02/2023] levothyroxine  (SYNTHROID ) tablet 100 mcg  100 mcg Oral Q0600 Nathanel Bal, PA-C       losartan  (COZAAR ) tablet 100 mg  100 mg Oral Daily Suarez, Joseph J, PA-C       pantoprazole  (PROTONIX ) EC tablet 40 mg  40 mg Oral Daily Suarez, Joseph J, PA-C       rosuvastatin  (CRESTOR ) tablet 10 mg  10 mg Oral Daily Suarez, Joseph J, PA-C       sodium chloride  flush (NS) 0.9 % injection 3 mL  3 mL Intravenous Q12H Mealor, Augustus E, MD  sodium chloride  flush (NS) 0.9 % injection 3 mL  3 mL Intravenous PRN Mealor, Augustus E, MD       [START ON 09/02/2023] spironolactone  (ALDACTONE ) tablet 25 mg  25 mg Oral Daily Nathanel Bal, PA-C        Physical Exam: BP (!) 121/52 (BP Location: Right Arm)   Pulse 72   Temp 97.6 F (36.4 C) (Oral)   Resp 16   Ht 5\' 7"  (1.702 m)   Wt 130.7 kg   SpO2 96%   BMI 45.12 kg/m   GEN- The patient is well appearing, alert and oriented x 3 today.   Neck - no JVD or carotid bruit noted Lungs- Clear to ausculation bilaterally, normal work of breathing Heart- Regular rate and rhythm, no murmurs,  rubs or gallops, PMI not laterally displaced Extremities- no clubbing, cyanosis, or edema Skin - no rash or ecchymosis noted   Wt Readings from Last 3 Encounters:  09/01/23 130.7 kg  09/01/23 130.4 kg  08/21/23 128.9 kg     EKG today demonstrates  Vent. rate 79 BPM PR interval 166 ms QRS duration 82 ms QT/QTcB 380/435 ms P-R-T axes 64 76 40 Normal sinus rhythm Low voltage QRS Borderline ECG When compared with ECG of 10-Aug-2023 11:08, Sinus rhythm has replaced Atrial fibrillation   Echo 04/16/23 demonstrated   1. Left ventricular ejection fraction, by estimation, is 60 to 65%. The  left ventricle has normal function. The left ventricle has no regional  wall motion abnormalities. There is mild asymmetric left ventricular  hypertrophy of the inferior segment. Left ventricular diastolic parameters are indeterminate.   2. Right ventricular systolic function is normal. The right ventricular  size is moderately enlarged.   3. Left atrial size was mildly dilated.   4. The mitral valve is normal in structure. Mild mitral valve  regurgitation. No evidence of mitral stenosis.   5. The aortic valve is normal in structure. Aortic valve regurgitation is  not visualized. No aortic stenosis is present.   6. The inferior vena cava is normal in size with greater than 50%  respiratory variability, suggesting right atrial pressure of 3 mmHg.    CHA2DS2-VASc Score = 5  The patient's score is based upon: CHF History: 1 HTN History: 1 Diabetes History: 0 Stroke History: 0 Vascular Disease History: 1 Age Score: 1 Gender Score: 1       ASSESSMENT AND PLAN: Paroxysmal Atrial Fibrillation (ICD10:  I48.0) The patient's CHA2DS2-VASc score is 5, indicating a 7.2% annual risk of stroke.    Patient in SR today but had 36% burden on recent cardiac monitor. Patient was here on 5/12 and by mistake stopped Eliquis  and resumed flecainide . She is now here after 3 weeks of uninterrupted  anticoagulation and off flecainide  greater than 3 days prior to admission.  Patient would like to pursue dofetilide admission Continue Eliquis  5 mg BID No recent benadryl  use PharmD to screen medications for QT prolonging agents. QTc in SR 435 ms Continue diltiazem  300 mg daily. Labs currently pending. CrCl 138 mL/min from [facility] on 07/23/23.  Secondary Hypercoagulable State (ICD10:  D68.69) The patient is at significant risk for stroke/thromboembolism based upon her CHA2DS2-VASc Score of 5.  Continue Apixaban  (Eliquis ).  No missed doses of Eliquis .    Patient will present to admissions when a bed is available.       Woody Heading, PA-C Afib Clinic [facility] [address] [place], [place] [address] [phone]    ADDEND: AFib  clinic note above to serve as H&P Hx of HTN, OSA, CAD/aortic atherosclerosis, asthma and AFib admitted for Tikosyn load 5/30: flecainide  stopped (having become ineffective > planned for Tikosyn) Eliquis  for a/c  CPAP ordered  THE PATIENT IS SELF ENROLLED INTO A CLINICAL TRIAL FOR CHOLESTEROL  [facility]ia [facility] (336) [phone] I have d/w our pharmacist, she is going to call to find out what the agent is to ensure not contraindicated prior to getting her started/releasing drug for tonight. >>> Appreciate our pharmacy team Pt enrolled in a trial for Lepodisiran (injection every 180 days) for lipoprotien a She discussed with enrollment center coordinator who reported no cardiac/QT concerns In her personal research (currently in phase III, as of phase II reports pharmacokinetics do not suggest any contraindication to Tikosyn and to date no arrhythmia/electrocardiographic abnormalities described Felt would be safe to proceed   will plan for 500mcg dose given Creat 0.78 (calc CrCl 134)    [name], PA-C

## 2023-09-01 NOTE — Plan of Care (Signed)

## 2023-09-01 NOTE — Progress Notes (Signed)
Placed patient on CPAP for the night via auto-mode.  

## 2023-09-01 NOTE — Progress Notes (Signed)
 Primary Care Physician: de Peru, Alonza Jansky, MD Primary Cardiologist: Sheryle Donning, MD Electrophysiologist: Will Cortland Ding, MD  Referring Physician: Slater Duncan NP   Georgiann Kirsch is a 73 y.o. female with a history of HTN, OSA, CAD/aortic atherosclerosis, asthma, atrial fibrillation who presents for follow up in the [facility].  The patient was initially diagnosed with atrial fibrillation in 2022 and has been maintained on flecainide . She wore a cardiac monitor 04/2023 which showed a 36% burden of afib. Patient does note intermittent symptoms of fatigue, SOB, and an unsettled feeling in her chest. Patient is on Eliquis  for stroke prevention.   On follow up 09/01/23, patient is here for Tikosyn admission. She stopped Eliquis  and resumed flecainide  on 5/12, prompting rescheduling of admission after 3 weeks of uninterrupted anticoagulation and discontinued flecainide  greater than 3 days prior to admission. No new medications since last OV. No benadryl  use. No missed doses of anticoagulant. She notes to have episode of Afib yesterday for a few hours.  Today, she denies symptoms of palpitations, chest pain, orthopnea, PND, lower extremity edema, dizziness, presyncope, syncope, snoring, daytime somnolence, bleeding, or neurologic sequela. The patient is tolerating medications without difficulties and is otherwise without complaint today.    Atrial Fibrillation Risk Factors:  she does have symptoms or diagnosis of sleep apnea. she does not have a history of rheumatic fever.   Atrial Fibrillation Management history:  Previous antiarrhythmic drugs: flecainide  Previous cardioversions: none Previous ablations: none Anticoagulation history: Eliquis   ROS- All systems are reviewed and negative except as per the HPI above.  Past Medical History:  Diagnosis Date   Arthritis    no meds   Asthma    Edema    Fibroids    Hyperlipidemia     Hypertension    Obesity    RML pneumonia 02/25/2021   Sleep apnea    wears cpap    Thyroid  nodule 01/2009   left discovered on chest ct; needle bx "indeterminant" cannon r/o follicular neoplasm     Current Outpatient Medications  Medication Sig Dispense Refill   acetaminophen  (TYLENOL ) 500 MG tablet Take 1,000 mg by mouth as needed.     albuterol  (VENTOLIN  HFA) 108 (90 Base) MCG/ACT inhaler Inhale 2 puffs into the lungs every 4 (four) hours as needed for wheezing or shortness of breath (coughing fits). 18 g 1   Benralizumab  (FASENRA  [place]) Inject into the skin every 2 (two) months.     Budeson-Glycopyrrol-Formoterol  (BREZTRI  AEROSPHERE) 160-9-4.8 MCG/ACT AERO Inhale 2 puffs into the lungs in the morning and at bedtime. 10.7 g 11   Cholecalciferol 50 MCG (2000 UT) CAPS Take 1 capsule by mouth daily. PATIENT USING OIL AND NOT CAPSULES     diltiazem  (CARDIZEM  CD) 300 MG 24 hr capsule TAKE 1 CAPSULE BY MOUTH EVERY DAY 90 capsule 3   ELIQUIS  5 MG TABS tablet TAKE 1 TABLET BY MOUTH TWICE A DAY 60 tablet 5   FLUZONE HIGH-DOSE 0.5 ML injection      furosemide  (LASIX ) 20 MG tablet TAKE 20-40MG  AS DIRECTED BY CARDIOLOGY OFFICE. (Patient taking differently: Take 20 mg by mouth daily. May take 40 mg as needed for fluid) 180 tablet 3   ipratropium-albuterol  (DUONEB) 0.5-2.5 (3) MG/3ML SOLN Take 3 mLs by nebulization every 6 (six) hours as needed. (Patient taking differently: Take 3 mLs by nebulization as needed.) 360 mL 0   levothyroxine  (SYNTHROID ) 100 MCG tablet TAKE 1 TABLET BY MOUTH EVERY MORNING ON AN EMPTY  STOMACH Orally Once a day 90 days 90 tablet 4   losartan  (COZAAR ) 100 MG tablet Take 1 tablet (100 mg total) by mouth daily. 90 tablet 1   Lutein 10 MG TABS Take 10 mg by mouth daily.     Magnesium  300 MG CAPS Not sure the dose     MULTIPLE VITAMIN PO Take 1 tablet by mouth daily.     Omega-3 Fatty Acids (FISH OIL) 1000 MG CAPS Take 1 capsule by mouth every morning.     pantoprazole  (PROTONIX ) 40  MG tablet TAKE 1 TABLET BY MOUTH EVERY DAY 90 tablet 1   rosuvastatin  (CRESTOR ) 10 MG tablet TAKE 1 TABLET BY MOUTH EVERY DAY 90 tablet 2   Spacer/Aero-Holding Chambers DEVI Use with spacer 1 each 2   spironolactone  (ALDACTONE ) 25 MG tablet Take 1 tablet (25 mg total) by mouth daily. 90 tablet 3   valACYclovir  (VALTREX ) 500 MG tablet Take 500 mg by mouth as needed.     No current facility-administered medications for this encounter.    Physical Exam: BP 124/64   Pulse 79   Ht 5\' 7"  (1.702 m)   Wt 130.4 kg   BMI 45.01 kg/m   GEN- The patient is well appearing, alert and oriented x 3 today.   Neck - no JVD or carotid bruit noted Lungs- Clear to ausculation bilaterally, normal work of breathing Heart- Regular rate and rhythm, no murmurs, rubs or gallops, PMI not laterally displaced Extremities- no clubbing, cyanosis, or edema Skin - no rash or ecchymosis noted   Wt Readings from Last 3 Encounters:  09/01/23 130.4 kg  08/21/23 128.9 kg  08/05/23 131.1 kg     EKG today demonstrates  Vent. rate 79 BPM PR interval 166 ms QRS duration 82 ms QT/QTcB 380/435 ms P-R-T axes 64 76 40 Normal sinus rhythm Low voltage QRS Borderline ECG When compared with ECG of 10-Aug-2023 11:08, Sinus rhythm has replaced Atrial fibrillation   Echo 04/16/23 demonstrated   1. Left ventricular ejection fraction, by estimation, is 60 to 65%. The  left ventricle has normal function. The left ventricle has no regional  wall motion abnormalities. There is mild asymmetric left ventricular  hypertrophy of the inferior segment. Left ventricular diastolic parameters are indeterminate.   2. Right ventricular systolic function is normal. The right ventricular  size is moderately enlarged.   3. Left atrial size was mildly dilated.   4. The mitral valve is normal in structure. Mild mitral valve  regurgitation. No evidence of mitral stenosis.   5. The aortic valve is normal in structure. Aortic valve  regurgitation is  not visualized. No aortic stenosis is present.   6. The inferior vena cava is normal in size with greater than 50%  respiratory variability, suggesting right atrial pressure of 3 mmHg.    CHA2DS2-VASc Score = 5  The patient's score is based upon: CHF History: 1 HTN History: 1 Diabetes History: 0 Stroke History: 0 Vascular Disease History: 1 Age Score: 1 Gender Score: 1       ASSESSMENT AND PLAN: Paroxysmal Atrial Fibrillation (ICD10:  I48.0) The patient's CHA2DS2-VASc score is 5, indicating a 7.2% annual risk of stroke.    Patient in SR today but had 36% burden on recent cardiac monitor. Patient was here on 5/12 and by mistake stopped Eliquis  and resumed flecainide . She is now here after 3 weeks of uninterrupted anticoagulation and off flecainide  greater than 3 days prior to admission.  Patient would like to pursue dofetilide  admission Continue Eliquis  5 mg BID No recent benadryl  use PharmD to screen medications for QT prolonging agents. QTc in SR 435 ms Continue diltiazem  300 mg daily. Labs currently pending. CrCl 138 mL/min from [facility] on 07/23/23.  Secondary Hypercoagulable State (ICD10:  D68.69) The patient is at significant risk for stroke/thromboembolism based upon her CHA2DS2-VASc Score of 5.  Continue Apixaban  (Eliquis ).  No missed doses of Eliquis .    Patient will present to admissions when a bed is available.       Woody Heading, PA-C Afib Clinic [facility] [address] [place], [place] [address] [phone]

## 2023-09-02 ENCOUNTER — Other Ambulatory Visit (HOSPITAL_BASED_OUTPATIENT_CLINIC_OR_DEPARTMENT_OTHER): Payer: Self-pay

## 2023-09-02 ENCOUNTER — Other Ambulatory Visit (HOSPITAL_COMMUNITY): Payer: Self-pay

## 2023-09-02 LAB — BASIC METABOLIC PANEL WITH GFR
Anion gap: 7 (ref 5–15)
BUN: 18 mg/dL (ref 8–23)
CO2: 30 mmol/L (ref 22–32)
Calcium: 8.8 mg/dL — ABNORMAL LOW (ref 8.9–10.3)
Chloride: 100 mmol/L (ref 98–111)
Creatinine, Ser: 0.64 mg/dL (ref 0.44–1.00)
GFR, Estimated: >60 mL/min (ref >60)
Glucose, Bld: 105 mg/dL — ABNORMAL HIGH (ref 70–99)
Potassium: 3.6 mmol/L (ref 3.5–5.1)
Sodium: 137 mmol/L (ref 135–145)

## 2023-09-02 LAB — MAGNESIUM: Magnesium: 2.2 mg/dL (ref 1.7–2.4)

## 2023-09-02 MED ORDER — POTASSIUM CHLORIDE CRYS ER 20 MEQ PO TBCR
60.0000 meq | EXTENDED_RELEASE_TABLET | Freq: Once | ORAL | Status: AC
  Administered 2023-09-02: 60 meq via ORAL
  Filled 2023-09-02: qty 3

## 2023-09-02 MED ORDER — DOFETILIDE 250 MCG PO CAPS
250.0000 ug | ORAL_CAPSULE | Freq: Two times a day (BID) | ORAL | Status: DC
  Administered 2023-09-02 – 2023-09-04 (×4): 250 ug via ORAL
  Filled 2023-09-02 (×4): qty 1

## 2023-09-02 MED ORDER — ORAL CARE MOUTH RINSE: 15.0000 mL | OROMUCOSAL | Status: DC | PRN

## 2023-09-02 NOTE — Anesthesia Preprocedure Evaluation (Signed)
 Anesthesia Evaluation  Patient identified by MRN, date of birth, ID band Patient awake    Reviewed: Allergy  & Precautions, H&P , NPO status , Patient's Chart, lab work & pertinent test results  Airway        Dental   Pulmonary asthma , sleep apnea , former smoker          Cardiovascular hypertension, (-) Past MI + dysrhythmias Atrial Fibrillation      Neuro/Psych neg Seizures negative neurological ROS  negative psych ROS   GI/Hepatic Neg liver ROS,GERD  ,,  Endo/Other  Hypothyroidism    Renal/GU negative Renal ROS  negative genitourinary   Musculoskeletal  (+) Arthritis ,    Abdominal   Peds negative pediatric ROS (+)  Hematology negative hematology ROS (+)   Anesthesia Other Findings   Reproductive/Obstetrics negative OB ROS                             Anesthesia Physical Anesthesia Plan  ASA: 2  Anesthesia Plan: General   Post-op Pain Management: Minimal or no pain anticipated   Induction: Intravenous  PONV Risk Score and Plan: 3 and Treatment may vary due to age or medical condition  Airway Management Planned: Natural Airway and Simple Face Mask  Additional Equipment: None  Intra-op Plan:   Post-operative Plan:   Informed Consent: I have reviewed the patients History and Physical, chart, labs and discussed the procedure including the risks, benefits and alternatives for the proposed anesthesia with the patient or authorized representative who has indicated his/her understanding and acceptance.     Dental advisory given  Plan Discussed with: CRNA  Anesthesia Plan Comments:        Anesthesia Quick Evaluation

## 2023-09-02 NOTE — TOC Benefit Eligibility Note (Addendum)
 Pharmacy Patient Advocate Encounter   Received notification from [facility] that prior authorization for Tikosyn is required/requested.   Insurance verification completed.   The patient is insured through [employer] .   Per test claim: PA required; PA submitted to above mentioned insurance via CoverMyMeds Key/confirmation #/EOC [id number] Status is pending    Received notification from [employer] that Prior Authorization for Tikosyn has been APPROVED from 04/01/23 to 03/30/24   PA #/Case ID/Reference #: [id number]  [employer] Medicare will not allow override on test claim, filling pharmacy will have to call to get an override.

## 2023-09-02 NOTE — TOC CM/SW Note (Signed)
 Transition of Care [facility]) - Inpatient Brief Assessment   Patient Details  Name: ORELIA BRANDSTETTER MRN: [id number] Date of Birth: [birth year]  Transition of Care [facility]) CM/SW Contact:    Cosimo Diones, RN Phone Number: 09/02/2023, 2:37 PM   Clinical Narrative: Patient presented for Tikosyn Load. Case Manager spoke with the patient regarding co pay cost. Patient is not agreeable to cost and would like to use [facility]. Patient would like to have the initial Rx filled via [facility] and the Rx refills 90 day supply escribed to [facility] [place]. No further needs identified at this time.    Transition of Care Asessment: Insurance and Status: Insurance coverage has been reviewed Patient has primary care physician: Yes Home environment has been reviewed: reviewed Prior level of function:: independent Social Drivers of Health Review: SDOH reviewed no interventions necessary Readmission risk has been reviewed: Yes Transition of care needs: no transition of care needs at this time

## 2023-09-02 NOTE — Progress Notes (Signed)
 Pharmacy: Dofetilide (Tikosyn) - Follow Up Assessment and Electrolyte Replacement  Pharmacy consulted to assist in monitoring and replacing electrolytes in this 73 y.o. female admitted on 09/01/2023 undergoing dofetilide .  Labs:    Component Value Date/Time   K 3.6 09/02/2023 0357   MG 2.2 09/02/2023 0357     Plan: Potassium: K 3.5-3.7:  Give KCl 60 mEq po x1   Magnesium : Mg > 2: No additional supplementation needed  Baxter Limber, PharmD Clinical Pharmacist **Pharmacist phone directory can now be found on [email or web address] ([email or web address]).  Listed under [facility].

## 2023-09-02 NOTE — Plan of Care (Signed)
  Problem: Elimination: Goal: Will not experience complications related to bowel motility Outcome: Progressing   Problem: Elimination: Goal: Will not experience complications related to urinary retention Outcome: Progressing   Problem: Pain Managment: Goal: General experience of comfort will improve and/or be controlled Outcome: Progressing   Problem: Nutrition: Goal: Adequate nutrition will be maintained Outcome: Progressing   Problem: Activity: Goal: Risk for activity intolerance will decrease Outcome: Progressing

## 2023-09-02 NOTE — Progress Notes (Addendum)
 Post dose EKG is reviewed SR 62bpm, QT had lengthened, will review with Dr Lawana Pray  [name], PA-C  ADDEND: Dr. Lawana Pray reviewed, will reduce dose for tonight to 250mcg [name], PA-C  [place], PA-C

## 2023-09-02 NOTE — Progress Notes (Addendum)
 Rounding Note   Patient Name: Amy Stokes Date of Encounter: 09/02/2023  [facility] Cardiologist: Sheryle Donning, MD   Subjective  Doing well She had brought in her Lutein, though not in the bottle so team was unable to dispense. She prefers to not have it here and resume at home  Scheduled Meds:  apixaban   5 mg Oral BID   budesonide-glycopyrrolate -formoterol   2 puff Inhalation BID   diltiazem   300 mg Oral Daily   dofetilide  500 mcg Oral BID   furosemide   20 mg Oral Daily   levothyroxine   100 mcg Oral Q0600   losartan   100 mg Oral Daily   Lutein  10 mg Oral QHS   pantoprazole   40 mg Oral Daily   potassium chloride   60 mEq Oral Once   rosuvastatin   10 mg Oral Daily   sodium chloride  flush  3 mL Intravenous Q12H   spironolactone   25 mg Oral Daily   Continuous Infusions:  sodium chloride      PRN Meds: sodium chloride , acetaminophen , albuterol , ipratropium-albuterol , sodium chloride  flush   Vital Signs  Vitals:   09/01/23 2027 09/01/23 2148 09/01/23 2305 09/02/23 0357  BP: (!) 123/56  (!) 130/56 (!) 121/55  Pulse: 73 81 70 66  Resp:  18 19 18   Temp: 97.8 F (36.6 C)  97.7 F (36.5 C) 97.7 F (36.5 C)  TempSrc: Oral  Oral Axillary  SpO2: 98% 95% 95% 96%  Weight:      Height:        Intake/Output Summary (Last 24 hours) at 09/02/2023 0724 Last data filed at 09/01/2023 2030 Gross per 24 hour  Intake 240 ml  Output --  Net 240 ml      09/01/2023    1:25 PM 09/01/2023   10:33 AM 08/21/2023   10:44 AM  Last 3 Weights  Weight (lbs) 288 lb 1.6 oz 287 lb 6.4 oz 284 lb 3.2 oz  Weight (kg) 130.681 kg 130.364 kg 128.912 kg      Telemetry SR 60s  - Personally Reviewed  ECG  SR 73bpm, QTc 486  - Personally Reviewed w/Dr. Lawana Pray  Physical Exam  GEN: No acute distress.   Neck: No JVD Cardiac: RRR, no murmurs, rubs, or gallops.  Respiratory: CTA b/l. GI: Soft, nontender, non-distended  MS: No edema; No deformity. Neuro:  Nonfocal  Psych:  Normal affect   Labs High Sensitivity Troponin:  No results for input(s): "TROPONINIHS" in the last 720 hours.   Chemistry Recent Labs  Lab 09/01/23 0000 09/02/23 0357  NA 140 137  K 4.4 3.6  CL 98 100  CO2 27 30  GLUCOSE 79 105*  BUN 20 18  CREATININE 0.78 0.64  CALCIUM  8.9 8.8*  MG 2.1 2.2  GFRNONAA  --  >60  ANIONGAP  --  7    Lipids No results for input(s): "CHOL", "TRIG", "HDL", "LABVLDL", "LDLCALC", "CHOLHDL" in the last 168 hours.  HematologyNo results for input(s): "WBC", "RBC", "HGB", "HCT", "MCV", "MCH", "MCHC", "RDW", "PLT" in the last 168 hours. Thyroid  No results for input(s): "TSH", "FREET4" in the last 168 hours.  BNPNo results for input(s): "BNP", "PROBNP" in the last 168 hours.  DDimer No results for input(s): "DDIMER" in the last 168 hours.   Radiology  No results found.  Cardiac Studies  Echo 04/16/23  1. Left ventricular ejection fraction, by estimation, is 60 to 65%. The  left ventricle has normal function. The left ventricle has no regional  wall motion  abnormalities. There is mild asymmetric left ventricular  hypertrophy of the inferior segment. Left ventricular diastolic parameters are indeterminate.   2. Right ventricular systolic function is normal. The right ventricular  size is moderately enlarged.   3. Left atrial size was mildly dilated.   4. The mitral valve is normal in structure. Mild mitral valve  regurgitation. No evidence of mitral stenosis.   5. The aortic valve is normal in structure. Aortic valve regurgitation is  not visualized. No aortic stenosis is present.   6. The inferior vena cava is normal in size with greater than 50%  respiratory variability, suggesting right atrial pressure of 3 mmHg.   Patient Profile   73 y.o. female w/PMHx of  HTN, OSA, Coronary Ca++/aortic atherosclerosis, asthma and AFib  Admitted for tikosyn  Assessment & Plan   Paroxysmal Afib CHa2DS2Vasc is 4, on Eliquis , appropriately dosed Tikosyn load  is in progress K+ 3.6 (replacement w/RPH/protocol) Mag 2.2 Creat 0.64 (stable) QTc stable  Continue load  HTN Home meds  OSA CPAP    For questions or updates, please contact [facility] Please consult [email or web address] for contact info under     Signed, Debbie Fails, PA-C  09/02/2023, 7:24 AM    I have seen and examined this patient with Amy Stokes.  Agree with above, note added to reflect my findings.  Feeling well without acute complaint  GEN: No acute distress.   Neck: No JVD Cardiac: RRR, no murmurs, rubs, or gallops.  Respiratory: normal BS bases bilaterally. GI: Soft, nontender, non-distended  MS: No edema; No deformity. Neuro:  Nonfocal  Skin: warm and dry Psych: Normal affect    Paroxysmal atrial fibrillation: On Eliquis .  Dofetilide load in progress.  QTc prolonged after her dose this morning billiard were reduced to 250 mcg twice daily.  Amy Stokes supplement potassium for goal greater than 4. Hypertension: Continue home medications Obstructive sleep apnea: Continue CPAP  Amy Stokes M. Amy Hakala MD 09/02/2023 2:43 PM

## 2023-09-03 ENCOUNTER — Encounter (HOSPITAL_COMMUNITY)
Admission: AD | Disposition: A | Discharge status: Home / Self Care | Payer: Self-pay | Source: Home / Self Care | Attending: Cardiology

## 2023-09-03 ENCOUNTER — Encounter (HOSPITAL_COMMUNITY): Payer: Self-pay

## 2023-09-03 LAB — BASIC METABOLIC PANEL WITH GFR
Anion gap: 5 (ref 5–15)
BUN: 15 mg/dL (ref 8–23)
CO2: 32 mmol/L (ref 22–32)
Calcium: 9 mg/dL (ref 8.9–10.3)
Chloride: 100 mmol/L (ref 98–111)
Creatinine, Ser: 0.7 mg/dL (ref 0.44–1.00)
GFR, Estimated: >60 mL/min (ref >60)
Glucose, Bld: 103 mg/dL — ABNORMAL HIGH (ref 70–99)
Potassium: 4 mmol/L (ref 3.5–5.1)
Sodium: 137 mmol/L (ref 135–145)

## 2023-09-03 LAB — MAGNESIUM: Magnesium: 2 mg/dL (ref 1.7–2.4)

## 2023-09-03 SURGERY — CARDIOVERSION (CATH LAB)
Anesthesia: General

## 2023-09-03 MED ORDER — MAGNESIUM SULFATE 2 GM/50ML IV SOLN
2.0000 g | Freq: Once | INTRAVENOUS | Status: AC
  Administered 2023-09-03: 2 g via INTRAVENOUS
  Filled 2023-09-03: qty 50

## 2023-09-03 NOTE — Plan of Care (Signed)
  Problem: Activity: Goal: Risk for activity intolerance will decrease Outcome: Progressing   Problem: Safety: Goal: Ability to remain free from injury will improve Outcome: Progressing   Problem: Pain Managment: Goal: General experience of comfort will improve and/or be controlled Outcome: Progressing   Problem: Skin Integrity: Goal: Risk for impaired skin integrity will decrease Outcome: Progressing

## 2023-09-03 NOTE — Progress Notes (Signed)
 Pharmacy: Dofetilide (Tikosyn) - Follow Up Assessment and Electrolyte Replacement  Pharmacy consulted to assist in monitoring and replacing electrolytes in this 73 y.o. female admitted on 09/01/2023 undergoing dofetilide initiation.   Labs:    Component Value Date/Time   K 4.0 09/03/2023 0415   MG 2.0 09/03/2023 0415     Plan: Potassium: K >/= 4: No additional supplementation needed  Magnesium : Mg 1.8-2: Give Mg 2 gm IV x1    She has required to total of 60meq oral potassium supplementation since 6/3. If potassium levels remain normal, I anticipate no need for a supplement at discharge.  Thank you for allowing pharmacy to participate in this patient's care   Baxter Limber, PharmD Clinical Pharmacist **Pharmacist phone directory can now be found on [email or web address] ([email or web address]).  Listed under [facility].

## 2023-09-03 NOTE — Progress Notes (Addendum)
 Rounding Note   Patient Name: TASHAE INDA Date of Encounter: 09/03/2023  [facility] Cardiologist: Sheryle Donning, MD   Subjective  Doing well   Scheduled Meds:  apixaban   5 mg Oral BID   budesonide-glycopyrrolate -formoterol   2 puff Inhalation BID   diltiazem   300 mg Oral Daily   dofetilide  250 mcg Oral BID   furosemide   20 mg Oral Daily   levothyroxine   100 mcg Oral Q0600   losartan   100 mg Oral Daily   Lutein  10 mg Oral QHS   pantoprazole   40 mg Oral Daily   rosuvastatin   10 mg Oral Daily   sodium chloride  flush  3 mL Intravenous Q12H   spironolactone   25 mg Oral Daily   Continuous Infusions:   PRN Meds: acetaminophen , albuterol , ipratropium-albuterol , mouth rinse, sodium chloride  flush   Vital Signs  Vitals:   09/02/23 2207 09/03/23 0001 09/03/23 0427 09/03/23 0823  BP:  (!) 143/56 120/63 (!) 112/53  Pulse: 67 71 61 67  Resp: 18 20 18 18   Temp:  98.3 F (36.8 C) 98.5 F (36.9 C) 98.4 F (36.9 C)  TempSrc:  Oral Oral Oral  SpO2: 94% 99% 100% 97%  Weight:      Height:        Intake/Output Summary (Last 24 hours) at 09/03/2023 0825 Last data filed at 09/02/2023 2000 Gross per 24 hour  Intake 200 ml  Output --  Net 200 ml      09/01/2023    1:25 PM 09/01/2023   10:33 AM 08/21/2023   10:44 AM  Last 3 Weights  Weight (lbs) 288 lb 1.6 oz 287 lb 6.4 oz 284 lb 3.2 oz  Weight (kg) 130.681 kg 130.364 kg 128.912 kg      Telemetry Unchanged, SR 60s  - Personally Reviewed  ECG  SR 67bpm, QTc 486  - Personally Reviewed w/Dr. Lawana Pray  Physical Exam  Seen/examined by Dr.Darlyn Repsher this morning unchanged GEN: No acute distress.   Neck: No JVD Cardiac: RRR, no murmurs, rubs, or gallops.  Respiratory: CTA b/l. GI: Soft, nontender, non-distended  MS: No edema; No deformity. Neuro:  Nonfocal  Psych: Normal affect   Labs High Sensitivity Troponin:  No results for input(s): "TROPONINIHS" in the last 720 hours.   Chemistry Recent Labs   Lab 09/01/23 0000 09/02/23 0357 09/03/23 0415  NA 140 137 137  K 4.4 3.6 4.0  CL 98 100 100  CO2 27 30 32  GLUCOSE 79 105* 103*  BUN 20 18 15   CREATININE 0.78 0.64 0.70  CALCIUM  8.9 8.8* 9.0  MG 2.1 2.2 2.0  GFRNONAA  --  >60 >60  ANIONGAP  --  7 5    Lipids No results for input(s): "CHOL", "TRIG", "HDL", "LABVLDL", "LDLCALC", "CHOLHDL" in the last 168 hours.  HematologyNo results for input(s): "WBC", "RBC", "HGB", "HCT", "MCV", "MCH", "MCHC", "RDW", "PLT" in the last 168 hours. Thyroid  No results for input(s): "TSH", "FREET4" in the last 168 hours.  BNPNo results for input(s): "BNP", "PROBNP" in the last 168 hours.  DDimer No results for input(s): "DDIMER" in the last 168 hours.   Radiology  No results found.  Cardiac Studies  Echo 04/16/23  1. Left ventricular ejection fraction, by estimation, is 60 to 65%. The  left ventricle has normal function. The left ventricle has no regional  wall motion abnormalities. There is mild asymmetric left ventricular  hypertrophy of the inferior segment. Left ventricular diastolic parameters are indeterminate.   2.  Right ventricular systolic function is normal. The right ventricular  size is moderately enlarged.   3. Left atrial size was mildly dilated.   4. The mitral valve is normal in structure. Mild mitral valve  regurgitation. No evidence of mitral stenosis.   5. The aortic valve is normal in structure. Aortic valve regurgitation is  not visualized. No aortic stenosis is present.   6. The inferior vena cava is normal in size with greater than 50%  respiratory variability, suggesting right atrial pressure of 3 mmHg.   Patient Profile   73 y.o. female w/PMHx of  HTN, OSA, Coronary Ca++/aortic atherosclerosis, asthma and AFib  Admitted for tikosyn  Assessment & Plan   Paroxysmal Afib CHa2DS2Vasc is 4, on Eliquis , appropriately dosed Tikosyn load is in progress K+ 4.0 Mag 2.0 Creat 0.70 (stable)  QTc has improved on lower  dose Continue load Anticipate discharge tomorrow  HTN Home meds  OSA CPAP    For questions or updates, please contact [facility] Please consult [email or web address] for contact info under     Signed, Debbie Fails, PA-C  09/03/2023, 8:25 AM    I have seen and examined this patient with [name].  Agree with above, note added to reflect my findings.  Feeling well without complaint  GEN: No acute distress.   Neck: No JVD Cardiac: RRR, no murmurs, rubs, or gallops.  Respiratory: normal BS bases bilaterally. GI: Soft, nontender, non-distended  MS: No edema; No deformity. Neuro:  Nonfocal  Skin: warm and dry,  Psych: Normal affect    Dismal atrial fibrillation: Patient undergoing dofetilide load.  Has remained in sinus rhythm.  Dose reduced last night with improvement in QTc.  No indication for electrolyte supplementation. Hypertension: Continue home medications Obstructive sleep apnea: CPAP compliance encouraged Class III obesity: Lifestyle modification encouraged  Lenard Kampf M. Shilo Philipson MD 09/03/2023 11:39 AM

## 2023-09-03 NOTE — Progress Notes (Signed)
 Post dose EKGs  reviewed with Dr. Lawana Pray Agree with QTc 463ms/QTc Continue Tikosyn 250mcg dose for this evening.  [name], PA-C

## 2023-09-04 ENCOUNTER — Other Ambulatory Visit (HOSPITAL_COMMUNITY): Payer: Self-pay

## 2023-09-04 LAB — MAGNESIUM: Magnesium: 2.3 mg/dL (ref 1.7–2.4)

## 2023-09-04 LAB — BASIC METABOLIC PANEL WITH GFR
Anion gap: 7 (ref 5–15)
BUN: 16 mg/dL (ref 8–23)
CO2: 33 mmol/L — ABNORMAL HIGH (ref 22–32)
Calcium: 9.4 mg/dL (ref 8.9–10.3)
Chloride: 99 mmol/L (ref 98–111)
Creatinine, Ser: 0.75 mg/dL (ref 0.44–1.00)
GFR, Estimated: >60 mL/min (ref >60)
Glucose, Bld: 100 mg/dL — ABNORMAL HIGH (ref 70–99)
Potassium: 4.2 mmol/L (ref 3.5–5.1)
Sodium: 139 mmol/L (ref 135–145)

## 2023-09-04 MED ORDER — DOFETILIDE 250 MCG PO CAPS
250.0000 ug | ORAL_CAPSULE | Freq: Two times a day (BID) | ORAL | 5 refills | Status: AC
Start: 2023-09-04 — End: ?
  Filled 2023-09-04: qty 60, 30d supply, fill #0

## 2023-09-04 NOTE — Progress Notes (Signed)
 Pharmacy: Dofetilide (Tikosyn) - Follow Up Assessment and Electrolyte Replacement  Pharmacy consulted to assist in monitoring and replacing electrolytes in this 73 y.o. female admitted on 09/01/2023 undergoing dofetilide initiation. First dofetilide dose: 500 mcg, then reduced to 250 mg BID on 6/4.  Labs:    Component Value Date/Time   K 4.2 09/04/2023 0513   MG 2.3 09/04/2023 0513     Plan: Potassium: K >/= 4: No additional supplementation needed  Magnesium : Mg > 2: No additional supplementation needed  Amy Stokes, [facility] Clinical Pharmacist  09/04/2023 7:50 AM   [facility] pharmacy phone numbers are listed on [email or web address]

## 2023-09-04 NOTE — Progress Notes (Signed)
 Telemetry and post dose EKG reviewed with Dr. Lawana Pray QTc stable (manually measured shorter then machine 475ms/QTc ) Continue Tikosyn Anticipate discharge today  [name], PA-C

## 2023-09-04 NOTE — Discharge Summary (Addendum)
 ELECTROPHYSIOLOGY PROCEDURE DISCHARGE SUMMARY    Patient ID: Amy Stokes,  MRN: [id number], DOB/AGE: May 07, 1950 73 y.o.  Admit date: 09/01/2023 Discharge date: 09/04/2023  Primary Care Physician: de [place], Amy Jansky, MD  Primary Cardiologist: Dr. Veryl Stokes Electrophysiologist: Dr. Lawana Stokes  Primary Discharge Diagnosis:  1.  paroxysmal atrial fibrillation status post Tikosyn loading this admission      CHA2DS2Vasc is 4, on Eliquis   Secondary Discharge Diagnosis:  HTN OSA W/CPAP Coronary Ca++  No Known Allergies   Procedures This Admission:  1.  Tikosyn loading  Brief HPI: Amy Stokes is a 73 y.o. female with a past medical history as noted above.  She is followed by the AFib clinic/EP team in the outpatient, having failed flecainide , recommended Tikosyn.  Risks, benefits, and alternatives to Tikosyn were reviewed with the patient who wished to proceed.    Hospital Course:  The patient was admitted and Tikosyn was initiated.  Renal function and electrolytes were followed during the hospitalization.  The patient's QTc did lengthen requiring dose reduction, though subsequently remained stable.  She arrived in SR and maintained throughout her stay.  On the day of discharge, she feels well, was examined by Dr Amy Stokes who considered the patient stable for discharge to home.  Follow-up has been arranged with the AFib clinic in 1 week and the EP team in 4 weeks.   Tikosyn teaching was completed No new or additional electrolyte replacement for home   Physical Exam: Vitals:   09/04/23 0408 09/04/23 0736 09/04/23 0820 09/04/23 1017  BP: (!) 115/57  (!) 113/48 (!) 118/47  Pulse: 64 64 64   Resp: 16 18 17    Temp: 98.3 F (36.8 C)  97.9 F (36.6 C)   TempSrc: Oral  Oral   SpO2: 96%  93%   Weight:      Height:         GEN- The patient is well appearing, alert and oriented x 3 today.   HEENT: normocephalic, atraumatic; sclera clear, conjunctiva pink; hearing  intact; oropharynx clear; neck supple, no JVP Lymph- no cervical lymphadenopathy Lungs- CTA b/l, normal work of breathing.  No wheezes, rales, rhonchi Heart- RRR, no murmurs, rubs or gallops, PMI not laterally displaced GI- soft, non-tender, non-distended Extremities- no clubbing, cyanosis, or edema MS- no significant deformity or atrophy Skin- warm and dry, no rash or lesion Psych- euthymic mood, full affect Neuro- strength and sensation are intact   Labs:   Lab Results  Component Value Date   WBC 7.6 05/02/2023   HGB 11.4 (L) 05/02/2023   HCT 37.1 05/02/2023   MCV 88.5 05/02/2023   PLT 233 05/02/2023    Recent Labs  Lab 09/04/23 0513  NA 139  K 4.2  CL 99  CO2 33*  BUN 16  CREATININE 0.75  CALCIUM  9.4  GLUCOSE 100*     Discharge Medications:  Allergies as of 09/04/2023   No Known Allergies      Medication List     STOP taking these medications    Fluzone High-Dose 0.5 ML injection Generic drug: Influenza vac split quadrivalent PF       TAKE these medications    acetaminophen  500 MG tablet Commonly known as: TYLENOL  Take 1,000 mg by mouth as needed.   albuterol  108 (90 Base) MCG/ACT inhaler Commonly known as: Ventolin  HFA Inhale 2 puffs into the lungs every 4 (four) hours as needed for wheezing or shortness of breath (coughing fits).   Breztri  Aerosphere  160-9-4.8 MCG/ACT Aero inhaler Generic drug: budesonide-glycopyrrolate -formoterol  Inhale 2 puffs into the lungs in the morning and at bedtime.   Cholecalciferol 25 MCG (1000 UT) capsule Take 1 capsule by mouth daily. GEL CAPSULES   diltiazem  300 MG 24 hr capsule Commonly known as: CARDIZEM  CD TAKE 1 CAPSULE BY MOUTH EVERY DAY   dofetilide 250 MCG capsule Commonly known as: TIKOSYN Take 1 capsule (250 mcg total) by mouth 2 (two) times daily.   Eliquis  5 MG Tabs tablet Generic drug: apixaban  TAKE 1 TABLET BY MOUTH TWICE A DAY   FASENRA  [place] Inject 1 Dose into the skin every 2 (two)  months.   Fish Oil 1000 MG Caps Take 1 capsule by mouth every morning.   furosemide  20 MG tablet Commonly known as: LASIX  TAKE 20-40MG  AS DIRECTED BY CARDIOLOGY OFFICE. What changed:  how much to take how to take this when to take this additional instructions   ipratropium-albuterol  0.5-2.5 (3) MG/3ML Soln Commonly known as: DUONEB Take 3 mLs by nebulization every 6 (six) hours as needed. What changed:  when to take this reasons to take this   levothyroxine  100 MCG tablet Commonly known as: SYNTHROID  Take 1 tablet by mouth every morning on an empty stomach (TAKE 1 TABLET BY MOUTH EVERY MORNING ON AN EMPTY STOMACH Orally Once a day 90 days)   losartan  100 MG tablet Commonly known as: COZAAR  Take 1 tablet (100 mg total) by mouth daily. What changed: when to take this   Lutein 10 MG Tabs Take 10 mg by mouth in the morning and at bedtime. Areds 2   MAGNESIUM  PO Take 1 each by mouth daily.   MULTIPLE VITAMIN PO Take 1 tablet by mouth daily.   pantoprazole  40 MG tablet Commonly known as: PROTONIX  TAKE 1 TABLET BY MOUTH EVERY DAY What changed:  when to take this additional instructions   rosuvastatin  10 MG tablet Commonly known as: CRESTOR  TAKE 1 TABLET BY MOUTH EVERY DAY What changed: when to take this   Spacer/Aero-Holding Amy Stokes Use with spacer   spironolactone  25 MG tablet Commonly known as: ALDACTONE  Take 1 tablet (25 mg total) by mouth daily.   valACYclovir  500 MG tablet Commonly known as: VALTREX  Take 500 mg by mouth as needed.        Disposition: home Discharge Instructions     Diet - low sodium heart healthy   Complete by: As directed    Increase activity slowly   Complete by: As directed         Duration of Discharge Encounter: 10 minutes, APP time.  Amy Lake, PA-C 09/04/2023 11:23 AM  I have seen and examined this patient with Amy Stokes.  Agree with above, note added to reflect my findings.  Patient feeling  well.  No.  Remains in sinus rhythm.  GEN: No acute distress.   Neck: No JVD Cardiac: RRR, no murmurs, rubs, or gallops.  Respiratory: normal BS bases bilaterally. GI: Soft, nontender, non-distended  MS: No edema; No deformity. Neuro:  Nonfocal  Skin: warm and dry, Psych: Normal affect    Paroxysmal atrial fibrillation: Post dofetilide load.  Dose reduced to 250 mcg twice daily.  QTc has remained stable on this dose.  Finnian Husted plan for discharge with follow-up in A-fib clinic. Hypertension: Continue home medication Obstructive sleep apnea: CPAP compliance encouraged  MD time of discharge: 22 minutes  Ta Fair M. Jahmeir Geisen MD 09/04/2023 2:43 PM

## 2023-09-08 ENCOUNTER — Telehealth: Payer: Self-pay

## 2023-09-08 NOTE — Transitions of Care (Post Inpatient/ED Visit) (Signed)
 09/08/2023  Name: Amy Stokes MRN: [id number] DOB: [birth year]  Today's TOC FU Call Status: Today's TOC FU Call Status:: Successful TOC FU Call Completed TOC FU Call Complete Date: 09/08/23 Patient's Name and Date of Birth confirmed.  Transition Care Management Follow-up Telephone Call Date of Discharge: 09/04/23 Discharge Facility: [facility]) Type of Discharge: Inpatient Admission Primary Inpatient Discharge Diagnosis:: Tikosyn  load, Afib How have you been since you were released from the hospital?: Better (reports that she feels well) Any questions or concerns?: No  Items Reviewed: Did you receive and understand the discharge instructions provided?: Yes Medications obtained,verified, and reconciled?: Yes (Medications Reviewed) Any new allergies since your discharge?: No Dietary orders reviewed?: Yes Type of Diet Ordered:: low sodium heart healthy diet Do you have support at home?: Yes People in Home [RPT]: spouse Name of Support/Comfort Primary Source: Sammie Crigler  Medications Reviewed Today: Medications Reviewed Today     Reviewed by Vanetta Generous, RN (Registered Nurse) on 09/08/23 at 1346  Med List Status: <None>   Medication Order Taking? Sig Documenting Provider Last Dose Status Informant  acetaminophen  (TYLENOL ) 500 MG tablet [id number] No Take 1,000 mg by mouth as needed.  Patient not taking: Reported on 09/08/2023   [provider] Not Taking Active Self, Pharmacy Records  albuterol  (VENTOLIN  HFA) 108 [phone] Base) MCG/ACT inhaler [id number] Yes Inhale 2 puffs into the lungs every 4 (four) hours as needed for wheezing or shortness of breath (coughing fits). Tinnie Forehand, FNP Taking Active Self, Pharmacy Records  Benralizumab  (FASENRA  [facility]) [id number] Yes Inject 1 Dose into the skin every 2 (two) months. [provider] Taking Active Self, Pharmacy Records           Med Note Karolynn Pack   Tue Sep 01, 2023  3:02 PM) Next dose due June 21st   Budeson-Glycopyrrol-Formoterol  (BREZTRI  AEROSPHERE) 160-9-4.8 MCG/ACT AERO [id number] Yes Inhale 2 puffs into the lungs in the morning and at bedtime. Trudy Fusi, DO Taking Active Self, Pharmacy Records  Cholecalciferol 25 MCG (1000 UT) capsule [id number] Yes Take 1 capsule by mouth daily. GEL CAPSULES [provider] Taking Active Self, Pharmacy Records  diltiazem  (CARDIZEM  CD) 300 MG 24 hr capsule [id number] Yes TAKE 1 CAPSULE BY MOUTH EVERY DAY Sheryle Donning, MD Taking Active Self, Pharmacy Records  dofetilide  (TIKOSYN ) 250 MCG capsule [id number] Yes Take 1 capsule (250 mcg total) by mouth 2 (two) times daily. Debbie Fails, PA-C Taking Active   ELIQUIS  5 MG TABS tablet [id number] Yes TAKE 1 TABLET BY MOUTH TWICE A DAY Sheryle Donning, MD Taking Active Self, Pharmacy Records  furosemide  (LASIX ) 20 MG tablet [id number] Yes TAKE 20-40MG  AS DIRECTED BY CARDIOLOGY OFFICE.  Patient taking differently: Take 20 mg by mouth daily. May take 40 mg as needed for fluid   Swinyer, Leilani Punter, NP Taking Active Self, Pharmacy Records           Med Note (ROSE, Lynita Groseclose U   Tue Sep 08, 2023  1:44 PM) Take 20 mg daily, then additional 20 mg for weigh gain of 2 pounds  ipratropium-albuterol  (DUONEB) 0.5-2.5 (3) MG/3ML SOLN [id number] No Take 3 mLs by nebulization every 6 (six) hours as needed.  Patient not taking: Reported on 09/08/2023   Kraig [place], MD Not Taking Active Self, Pharmacy Records  levothyroxine  (SYNTHROID ) 100 MCG tablet [id number] Yes TAKE 1 TABLET BY MOUTH EVERY MORNING ON AN EMPTY STOMACH Orally Once a day 90 days  Taking Active Self, Pharmacy Records  losartan  (  COZAAR ) 100 MG tablet [id number] Yes Take 1 tablet (100 mg total) by mouth daily.  Patient taking differently: Take 100 mg by mouth every evening.   de [place], Raymond J, MD Taking Active Self, Pharmacy Records  Lutein  10 MG TABS [id number] Yes Take 10 mg by mouth in the morning and at bedtime. Areds 2 [provider] Taking Active Self, Pharmacy Records  MAGNESIUM  PO [id number] Yes Take 1 each by mouth daily. [provider] Taking Active Self, Pharmacy Records  MULTIPLE VITAMIN PO [id number] Yes Take 1 tablet by mouth daily. [provider] Taking Active Self, Pharmacy Records  Omega-3 Fatty Acids (FISH OIL) 1000 MG CAPS [id number] Yes Take 1 capsule by mouth every morning. [provider] Taking Active Self, Pharmacy Records  pantoprazole  (PROTONIX ) 40 MG tablet [id number] Yes TAKE 1 TABLET BY MOUTH EVERY DAY  Patient taking differently: Take 40 mg by mouth daily after breakfast. Take 1 tablet by mouth 4 hours after taking levothyroxine    Margaretann Sharper, MD Taking Active Self, Pharmacy Records  rosuvastatin  (CRESTOR ) 10 MG tablet [id number] Yes TAKE 1 TABLET BY MOUTH EVERY DAY  Patient taking differently: Take 10 mg by mouth every evening.   Clearnce Curia, NP Taking Active Self, Pharmacy Records  Spacer/Aero-Holding [place] [id number] Yes Use with spacer Roetta Clarke, NP Taking Active Self, Pharmacy Records  spironolactone  (ALDACTONE ) 25 MG tablet [id number] Yes Take 1 tablet (25 mg total) by mouth daily. Swinyer, Leilani Punter, NP Taking Active Self, Pharmacy Records  valACYclovir  (VALTREX ) 500 MG tablet [id number] No Take 500 mg by mouth as needed.  Patient not taking: Reported on 09/08/2023   [provider] Not Taking Active Self, Pharmacy Records            Home Care and Equipment/Supplies: Were Home Health Services Ordered?: No Any new equipment or medical supplies ordered?: No  Functional Questionnaire: Do you need assistance with bathing/showering or dressing?: No Do you need assistance with meal preparation?: No Do you need assistance with eating?: No Do you have difficulty maintaining continence: No Do you need assistance with getting out of bed/getting out of a chair/moving?: No Do you have difficulty managing or taking your  medications?: No  Follow up appointments reviewed: PCP Follow-up appointment confirmed?: Yes (will try to move up.) Date of PCP follow-up appointment?: 10/06/23 Follow-up Provider: [place] [facility] Follow-up appointment confirmed?: Yes Date of Specialist follow-up appointment?: 09/14/23 Follow-Up Specialty Provider:: A fib clinic Do you need transportation to your follow-up appointment?: No Do you understand care options if your condition(s) worsen?: Yes-patient verbalized understanding  SDOH Interventions Today    Flowsheet Row Most Recent Value  SDOH Interventions   Food Insecurity Interventions Intervention Not Indicated  Housing Interventions Intervention Not Indicated  Transportation Interventions Intervention Not Indicated  Utilities Interventions Intervention Not Indicated      Patient reports that she is doing very well since hospital discharge.  Denies any chest pain, palpitations or shortness of breath. Interventions:  Reviewed all discharge orders and medications. Encouraged patient to take her medications as prescribed Reviewed S/S of bleeding since on Eliqus. Reviewed follow up appointments. Ensured transportation. Reviewed A fib zones and when to call 911.   Reviewed and offered 30 day TOC program and patient declines. Provided my contact information.  Orpha Blade, RN, BSN, CEN [employer]- Transition of Care Team.  [facility] [phone]

## 2023-09-13 DIAGNOSIS — J9601 Acute respiratory failure with hypoxia: Secondary | ICD-10-CM | POA: Diagnosis not present

## 2023-09-14 ENCOUNTER — Encounter (HOSPITAL_COMMUNITY): Payer: Self-pay | Admitting: Physician Assistant

## 2023-09-14 ENCOUNTER — Ambulatory Visit (HOSPITAL_COMMUNITY)
Admit: 2023-09-14 | Discharge: 2023-09-14 | Disposition: A | Discharge status: Home / Self Care | Attending: Physician Assistant | Admitting: Physician Assistant

## 2023-09-14 VITALS — BP 134/60 | HR 68 | Ht 67.0 in | Wt 282.4 lb

## 2023-09-14 DIAGNOSIS — Z79899 Other long term (current) drug therapy: Secondary | ICD-10-CM | POA: Diagnosis not present

## 2023-09-14 DIAGNOSIS — I480 Paroxysmal atrial fibrillation: Secondary | ICD-10-CM | POA: Diagnosis not present

## 2023-09-14 DIAGNOSIS — D6869 Other thrombophilia: Secondary | ICD-10-CM

## 2023-09-14 DIAGNOSIS — Z5181 Encounter for therapeutic drug level monitoring: Secondary | ICD-10-CM

## 2023-09-14 MED ORDER — DOFETILIDE 250 MCG PO CAPS: 250.0000 ug | ORAL_CAPSULE | Freq: Two times a day (BID) | ORAL | 3 refills | Status: AC

## 2023-09-14 NOTE — Progress Notes (Signed)
 Primary Care Physician: de [place], Alonza Jansky, MD Primary Cardiologist: Sheryle Donning, MD Electrophysiologist: Will Cortland Ding, MD  Referring Physician: Slater Duncan NP   Amy Stokes is a 73 y.o. female with a history of HTN, OSA, CAD/aortic atherosclerosis, asthma, atrial fibrillation who presents for follow up in the [facility].  The patient was initially diagnosed with atrial fibrillation in 2022 and has been maintained on flecainide . She wore a cardiac monitor 04/2023 which showed a 36% burden of afib. Patient does note intermittent symptoms of fatigue, SOB, and an unsettled feeling in her chest. Patient is on Eliquis  for stroke prevention.   S/p dofetilide  loading 6/3-09/04/23.  Patient returns for follow up for atrial fibrillation and dofetilide  monitoring. She reports that she has more energy since starting dofetilide . She remains in SR today. No bleeding issues on anticoagulation.   Today, she  denies symptoms of palpitations, chest pain, shortness of breath, orthopnea, PND, lower extremity edema, dizziness, presyncope, syncope, bleeding, or neurologic sequela. The patient is tolerating medications without difficulties and is otherwise without complaint today.    Atrial Fibrillation Risk Factors:  she does have symptoms or diagnosis of sleep apnea. she does not have a history of rheumatic fever.   Atrial Fibrillation Management history:  Previous antiarrhythmic drugs: flecainide , dofetilide   Previous cardioversions: none Previous ablations: none Anticoagulation history: Eliquis   ROS- All systems are reviewed and negative except as per the HPI above.  Past Medical History:  Diagnosis Date   Arthritis    no meds   Asthma    Edema    Fibroids    Hyperlipidemia    Hypertension    Obesity    RML pneumonia 02/25/2021   Sleep apnea    wears cpap    Thyroid  nodule 01/2009   left discovered on chest ct; needle bx  indeterminant cannon r/o follicular neoplasm     Current Outpatient Medications  Medication Sig Dispense Refill   acetaminophen  (TYLENOL ) 500 MG tablet Take 1,000 mg by mouth as needed.     Benralizumab  (FASENRA  [place]) Inject 1 Dose into the skin every 2 (two) months.     Budeson-Glycopyrrol-Formoterol  (BREZTRI  AEROSPHERE) 160-9-4.8 MCG/ACT AERO Inhale 2 puffs into the lungs in the morning and at bedtime. 10.7 g 11   Cholecalciferol 25 MCG (1000 UT) capsule Take 1 capsule by mouth daily. GEL CAPSULES     diltiazem  (CARDIZEM  CD) 300 MG 24 hr capsule TAKE 1 CAPSULE BY MOUTH EVERY DAY 90 capsule 3   dofetilide  (TIKOSYN ) 250 MCG capsule Take 1 capsule (250 mcg total) by mouth 2 (two) times daily. 60 capsule 5   ELIQUIS  5 MG TABS tablet TAKE 1 TABLET BY MOUTH TWICE A DAY 60 tablet 5   furosemide  (LASIX ) 20 MG tablet TAKE 20-40MG  AS DIRECTED BY CARDIOLOGY OFFICE. 180 tablet 3   ipratropium-albuterol  (DUONEB) 0.5-2.5 (3) MG/3ML SOLN Take 3 mLs by nebulization every 6 (six) hours as needed. 360 mL 0   levothyroxine  (SYNTHROID ) 100 MCG tablet TAKE 1 TABLET BY MOUTH EVERY MORNING ON AN EMPTY STOMACH Orally Once a day 90 days 90 tablet 4   losartan  (COZAAR ) 100 MG tablet Take 1 tablet (100 mg total) by mouth daily. 90 tablet 1   Lutein  10 MG TABS Take 10 mg by mouth in the morning and at bedtime. Areds 2     MAGNESIUM  PO Take 1 each by mouth daily.     MULTIPLE VITAMIN PO Take 1 tablet by mouth daily.  Omega-3 Fatty Acids (FISH OIL) 1000 MG CAPS Take 1 capsule by mouth every morning.     pantoprazole  (PROTONIX ) 40 MG tablet TAKE 1 TABLET BY MOUTH EVERY DAY 90 tablet 1   rosuvastatin  (CRESTOR ) 10 MG tablet TAKE 1 TABLET BY MOUTH EVERY DAY 90 tablet 2   Spacer/Aero-Holding [place] DEVI Use with spacer 1 each 2   spironolactone  (ALDACTONE ) 25 MG tablet Take 1 tablet (25 mg total) by mouth daily. 90 tablet 3   valACYclovir  (VALTREX ) 500 MG tablet Take 500 mg by mouth as needed.     albuterol  (VENTOLIN   HFA) 108 (90 Base) MCG/ACT inhaler Inhale 2 puffs into the lungs every 4 (four) hours as needed for wheezing or shortness of breath (coughing fits). 18 g 1   No current facility-administered medications for this encounter.    Physical Exam: BP 134/60   Pulse 68   Ht 5' 7 (1.702 m)   Wt 128.1 kg   BMI 44.23 kg/m   GEN: Well nourished, well developed in no acute distress CARDIAC: Regular rate and rhythm, no murmurs, rubs, gallops RESPIRATORY:  Clear to auscultation without rales, wheezing or rhonchi  ABDOMEN: Soft, non-tender, non-distended EXTREMITIES:  No edema; No deformity    Wt Readings from Last 3 Encounters:  09/14/23 128.1 kg  09/08/23 127 kg  09/01/23 130.7 kg     EKG today demonstrates  SR Vent. rate 68 BPM PR interval 174 ms QRS duration 72 ms QT/QTcB 464/493 ms   Echo 04/16/23 demonstrated   1. Left ventricular ejection fraction, by estimation, is 60 to 65%. The  left ventricle has normal function. The left ventricle has no regional  wall motion abnormalities. There is mild asymmetric left ventricular  hypertrophy of the inferior segment. Left ventricular diastolic parameters are indeterminate.   2. Right ventricular systolic function is normal. The right ventricular  size is moderately enlarged.   3. Left atrial size was mildly dilated.   4. The mitral valve is normal in structure. Mild mitral valve  regurgitation. No evidence of mitral stenosis.   5. The aortic valve is normal in structure. Aortic valve regurgitation is  not visualized. No aortic stenosis is present.   6. The inferior vena cava is normal in size with greater than 50%  respiratory variability, suggesting right atrial pressure of 3 mmHg.    CHA2DS2-VASc Score = 5  The patient's score is based upon: CHF History: 1 HTN History: 1 Diabetes History: 0 Stroke History: 0 Vascular Disease History: 1 Age Score: 1 Gender Score: 1       ASSESSMENT AND PLAN: Paroxysmal Atrial Fibrillation  (ICD10:  I48.0) The patient's CHA2DS2-VASc score is 5, indicating a 7.2% annual risk of stroke.   Previously failed flecainide  S/p dofetilide  loading 6/3-09/04/23 Patient appears to be maintaining SR Continue diltiazem  300 mg daily Continue Eliquis  5 mg BID  Secondary Hypercoagulable State (ICD10:  D68.69) The patient is at significant risk for stroke/thromboembolism based upon her CHA2DS2-VASc Score of 5.  Continue Apixaban  (Eliquis ). No bleeding issues.   High Risk Medication Monitoring (ICD 10: Z79.899) QT interval on ECG acceptable for dofetilide  monitoring. Check bmet/mag today.     HTN Stable on current regimen  OSA  Encouraged nightly CPAP  CAD/Aortic atherosclerosis  No anginal symptoms Followed by Dr Veryl Gottron    Follow up in the AF clinic in one month.       Myrtha Ates PA-C Af[facility] [address] [place], [place] [address] [phone]

## 2023-09-15 ENCOUNTER — Ambulatory Visit (HOSPITAL_COMMUNITY): Payer: Self-pay | Admitting: Physician Assistant

## 2023-09-15 LAB — BASIC METABOLIC PANEL WITH GFR
BUN/Creatinine Ratio: 18 (ref 12–28)
BUN: 13 mg/dL (ref 8–27)
CO2: 27 mmol/L (ref 20–29)
Calcium: 9.3 mg/dL (ref 8.7–10.3)
Chloride: 97 mmol/L (ref 96–106)
Creatinine, Ser: 0.74 mg/dL (ref 0.57–1.00)
Glucose: 100 mg/dL — ABNORMAL HIGH (ref 70–99)
Potassium: 4.4 mmol/L (ref 3.5–5.2)
Sodium: 142 mmol/L (ref 134–144)
eGFR: 86 mL/min/{1.73_m2} (ref >59)

## 2023-09-15 LAB — MAGNESIUM: Magnesium: 2.2 mg/dL (ref 1.6–2.3)

## 2023-10-06 ENCOUNTER — Ambulatory Visit: Admitting: Physician Assistant

## 2023-10-06 ENCOUNTER — Encounter (HOSPITAL_BASED_OUTPATIENT_CLINIC_OR_DEPARTMENT_OTHER): Payer: Self-pay | Admitting: Family Medicine

## 2023-10-06 ENCOUNTER — Ambulatory Visit (INDEPENDENT_AMBULATORY_CARE_PROVIDER_SITE_OTHER): Payer: Medicare HMO | Admitting: Family Medicine

## 2023-10-06 VITALS — BP 125/60 | HR 73 | Ht 67.0 in | Wt 280.9 lb

## 2023-10-06 DIAGNOSIS — R252 Cramp and spasm: Secondary | ICD-10-CM | POA: Diagnosis not present

## 2023-10-06 DIAGNOSIS — R7301 Impaired fasting glucose: Secondary | ICD-10-CM | POA: Insufficient documentation

## 2023-10-06 DIAGNOSIS — E049 Nontoxic goiter, unspecified: Secondary | ICD-10-CM | POA: Insufficient documentation

## 2023-10-06 DIAGNOSIS — E02 Subclinical iodine-deficiency hypothyroidism: Secondary | ICD-10-CM | POA: Insufficient documentation

## 2023-10-06 DIAGNOSIS — I10 Essential (primary) hypertension: Secondary | ICD-10-CM | POA: Diagnosis not present

## 2023-10-06 NOTE — Progress Notes (Signed)
    Procedures performed today:    None.  Independent interpretation of notes and tests performed by another provider:   None.  Brief History, Exam, Impression, and Recommendations:    BP 125/60 (BP Location: Left Arm, Patient Position: Sitting, Cuff Size: Large)   Pulse 73   Ht 5' 7 (1.702 m)   Wt 280 lb 14.4 oz (127.4 kg)   SpO2 96%   BMI 44.00 kg/m   Cramping of hands Assessment & Plan: Patient reports that in recent weeks, she has had some cramping and pains bilaterally.  This has occurred sporadically, sometimes when she is actively using her hands.  She has not noticed any change in strength, no tingling.  She has also had some lower extremity cramping at times as well.  These will often not occur simultaneously.  Does not note waking up at night with symptoms in her hands.   No abnormality noted on inspection.  No intrinsic muscle wasting noted.  Normal sensation throughout hands bilaterally.  Normal grip strength.  Negative Tinel's bilaterally. Reviewed recent labs, did have BMP and magnesium  completed which were both reassuring.  We discussed general considerations.  Exam is generally reassuring today.  Ultimately, may need to consider activity modification, regular stretching of hands and upper extremities.  Could consider use of B complex vitamin over-the-counter to see if this leads to improvement as well   Essential hypertension Assessment & Plan: Blood pressure appropriate in office today.  Can continue with diltiazem , losartan .  Recommend intermittent monitoring of blood pressure home, DASH diet   Patient does report that her back is doing better from recent transverse process fracture.  Return in about 6 months (around 04/07/2024) for hypertension.   ___________________________________________ Zyara Riling [place], MD, ABFM, CAQSM Primary Care and Sports Medicine [facility]

## 2023-10-06 NOTE — Patient Instructions (Signed)

## 2023-10-13 DIAGNOSIS — R252 Cramp and spasm: Secondary | ICD-10-CM | POA: Insufficient documentation

## 2023-10-13 DIAGNOSIS — J9601 Acute respiratory failure with hypoxia: Secondary | ICD-10-CM | POA: Diagnosis not present

## 2023-10-13 NOTE — Assessment & Plan Note (Signed)
 Patient reports that in recent weeks, she has had some cramping and pains bilaterally.  This has occurred sporadically, sometimes when she is actively using her hands.  She has not noticed any change in strength, no tingling.  She has also had some lower extremity cramping at times as well.  These will often not occur simultaneously.  Does not note waking up at night with symptoms in her hands.   No abnormality noted on inspection.  No intrinsic muscle wasting noted.  Normal sensation throughout hands bilaterally.  Normal grip strength.  Negative Tinel's bilaterally. Reviewed recent labs, did have BMP and magnesium  completed which were both reassuring.  We discussed general considerations.  Exam is generally reassuring today.  Ultimately, may need to consider activity modification, regular stretching of hands and upper extremities.  Could consider use of B complex vitamin over-the-counter to see if this leads to improvement as well

## 2023-10-13 NOTE — Assessment & Plan Note (Signed)
 Blood pressure appropriate in office today.  Can continue with diltiazem , losartan .  Recommend intermittent monitoring of blood pressure home, DASH diet

## 2023-10-14 ENCOUNTER — Other Ambulatory Visit: Payer: Self-pay

## 2023-10-14 ENCOUNTER — Ambulatory Visit: Admitting: Allergy

## 2023-10-14 ENCOUNTER — Encounter: Payer: Self-pay | Admitting: Allergy

## 2023-10-14 VITALS — BP 136/70 | HR 68 | Temp 98.2°F | Resp 20 | Ht 67.0 in | Wt 218.4 lb

## 2023-10-14 DIAGNOSIS — K219 Gastro-esophageal reflux disease without esophagitis: Secondary | ICD-10-CM

## 2023-10-14 DIAGNOSIS — J4550 Severe persistent asthma, uncomplicated: Secondary | ICD-10-CM

## 2023-10-14 DIAGNOSIS — J3089 Other allergic rhinitis: Secondary | ICD-10-CM

## 2023-10-14 DIAGNOSIS — Z91038 Other insect allergy status: Secondary | ICD-10-CM

## 2023-10-14 NOTE — Patient Instructions (Addendum)
 Asthma: Daily controller medication(s): Breztri  2 puffs twice a day with spacer and rinse mouth afterwards.  Continue Fasenra  injections every 8 weeks at home.  Monitor wheezing episodes. May use albuterol  rescue inhaler 2 puffs or nebulizer every 4 to 6 hours as needed for shortness of breath, chest tightness, coughing, and wheezing. May use albuterol  rescue inhaler 2 puffs 5 to 15 minutes prior to strenuous physical activities. Monitor frequency of use - if you need to use it more than twice per week on a consistent basis let us  know.  Asthma control goals:  Full participation in all desired activities (may need albuterol  before activity) Albuterol  use two times or less a week on average (not counting use with activity) Cough interfering with sleep two times or less a month Oral steroids no more than once a year No hospitalizations   Environmental allergies 2023 skin testing positive to mold, cockroach, dust mites.  Continue environmental control measures.  Use over the counter antihistamines such as Zyrtec (cetirizine), Claritin (loratadine), Allegra  (fexofenadine ), or Xyzal (levocetirizine) daily as needed. May take twice a day during allergy  flares. May switch antihistamines every few months. Use Flonase  (fluticasone ) nasal spray 1-2 sprays per nostril once a day as needed for nasal congestion.  Nasal saline spray (i.e., Simply Saline) or nasal saline lavage (i.e., NeilMed) is recommended as needed and prior to medicated nasal sprays.  Reflux Continue Protonix  40mg  daily as before.   Follow up in 6 months or sooner if needed.    Get flu shot in the fall.  Consider getting RSV vaccine.

## 2023-10-14 NOTE — Progress Notes (Deleted)
 Follow Up Note  RE: Amy Stokes MRN: [id number] DOB: 1950-10-19 Date of Office Visit: 10/14/2023  Referring provider: de [place], Quintin PARAS, MD Primary care provider: de [place], Raymond J, MD  Chief Complaint: Follow-up (3 month follow ) and Asthma (No complaints )  History of Present Illness: I had the pleasure of seeing Amy Stokes for a follow up visit at the [facility] on 10/14/2023. She is a 73 y.o. female, who is being followed for asthma on Fasenra , allergic rhinitis and reflux. Her previous allergy  office visit was on 07/15/2023 with Wanda Craze FNP. Today is a regular follow up visit.  Discussed the use of AI scribe software for clinical note transcription with the patient, who gave verbal consent to proceed.  History of Present Illness   She takes Breztri , two puffs twice a Amy Stokes, and Fasenra  injections every eight weeks, which she administers at home without issues. She has not required emergency care or prednisone  recently. She used her albuterol  rescue inhaler once last week due to wheezing and typically uses it twice a month.  Her allergies are stable, and she rarely uses nasal sprays or antihistamines.  She continues to take Protonix  for heartburn.  For atrial fibrillation, she recently switched to dofetilide . She remains on Eliquis  as a blood thinner.  She uses a CPAP machine for sleep apnea.  She has received her flu shots annually and has completed her pneumonia vaccinations. She has not yet received the RSV vaccine.        ***  Assessment and Plan: Amy Stokes is a 73 y.o. female with: Severe persistent asthma: Your breathing test looks stable today Complete Full PFT as ordered by pulmonology Daily controller medication(s): Breztri  2 puffs twice a Amy Stokes with spacer and rinse mouth afterwards.  Samples given.  Continue Fasenra  injection  Monitor wheezing episodes. May use albuterol  rescue inhaler 2 puffs or nebulizer every 4 to 6 hours as  needed for shortness of breath, chest tightness, coughing, and wheezing. May use albuterol  rescue inhaler 2 puffs 5 to 15 minutes prior to strenuous physical activities. Monitor frequency of use - if you need to use it more than twice per week on a consistent basis let us  know.  Asthma control goals:  Full participation in all desired activities (may need albuterol  before activity) Albuterol  use two times or less a week on average (not counting use with activity) Cough interfering with sleep two times or less a month Oral steroids no more than once a year No hospitalizations    Allergic rhinitis 2023 skin testing positive to mold, cockroach, dust mites.  Continue environmental control measures.  Use over the counter antihistamines such as Zyrtec (cetirizine), Claritin (loratadine), Allegra  (fexofenadine ), or Xyzal (levocetirizine) daily as needed. May take twice a Amy Stokes during allergy  flares. May switch antihistamines every few months. Use Flonase  (fluticasone ) nasal spray 1-2 sprays per nostril once a Amy Stokes as needed for nasal congestion.  Nasal saline spray (i.e., Simply Saline) or nasal saline lavage (i.e., NeilMed) is recommended as needed and prior to medicated nasal sprays.   Reflux Continue Protonix  40mg  daily as before.    Follow up in 3 months or sooner if needed.   Continue to follow up with cardiology pulmonology     Not well controlled severe persistent asthma Past history - Diagnosed with asthma for 30 years.  Main triggers include dust.  Follows with pulmonology.  2023 blood work showed elevated eosinophils of 300 and IgE of 1409. 2023  spirometry showed severe restriction with no improvement FEV1 postbronchodilator treatment.  Clinically unchanged. Started Fasenra  in June 2024 Interim history - Daily wheezing, recent bronchitis treated with doxycycline  and prednisone . Unclear benefit from Fasenra  injections. Today's spirometry showed severe obstruction with no improvement in FEV1  post bronchodilator treatment. Clinically feeling unchanged.  Daily controller medication(s): Breztri  2 puffs twice a Amy Stokes with spacer and rinse mouth afterwards.  Samples given as patient is waiting on her mail order shipment.  Give Fasenra  injection on 12/27th and then will switch to Tezspire  injection - every 4 weeks. Handout given.  Amy Stokes will be in touch with you regarding coverage.  Monitor wheezing episodes. May use albuterol  rescue inhaler 2 puffs or nebulizer every 4 to 6 hours as needed for shortness of breath, chest tightness, coughing, and wheezing. May use albuterol  rescue inhaler 2 puffs 5 to 15 minutes prior to strenuous physical activities. Monitor frequency of use - if you need to use it more than twice per week on a consistent basis let us  know.   Get spirometry at next visit. Follow up with cardiology, pulmonology and PCP as not all of her shortness of breath and DOE symptoms seem to be from her asthma but most likely has a cardiac and deconditioning component.   Allergic rhinitis due to dust mite Allergic rhinitis due to mold Allergy  to cockroaches Past history - 2023 skin testing positive to mold, cockroach, dust mites.  Interim history - stopped Singulair  and allegra  with no worsening symptoms. Continue environmental control measures.  Use over the counter antihistamines such as Zyrtec (cetirizine), Claritin (loratadine), Allegra  (fexofenadine ), or Xyzal (levocetirizine) daily as needed. May take twice a Amy Stokes during allergy  flares. May switch antihistamines every few months. Use Flonase  (fluticasone ) nasal spray 1-2 sprays per nostril once a Amy Stokes as needed for nasal congestion.  Nasal saline spray (i.e., Simply Saline) or nasal saline lavage (i.e., NeilMed) is recommended as needed and prior to medicated nasal sprays.   Gastroesophageal reflux disease, unspecified whether esophagitis present Continue Protonix  daily as before.  Assessment and Plan    Asthma Asthma is  well-controlled with Breztri  and Fasenra . No recent exacerbations or need for prednisone . Albuterol  used infrequently. - Continue Breztri  two puffs twice a Amy Stokes. - Continue Fasenra  injections every eight weeks. - Use albuterol  as needed for wheezing. - Ensure ample supply of Breztri .  Atrial Fibrillation Atrial fibrillation is effectively managed with dofetilide  and Eliquis . - Continue dofetilide  for heart rate control. - Continue Eliquis  for anticoagulation.  Gastroesophageal Reflux Disease (GERD) GERD is well-controlled with Protonix . - Continue Protonix  as prescribed.  Obstructive Sleep Apnea Obstructive sleep apnea is well-managed with CPAP therapy. - Continue CPAP therapy.  Immunizations She is up to date with pneumonia vaccinations. Plans to receive flu shot in fall. RSV vaccination optional. - Receive flu shot in September or October. - Consider RSV vaccination if around young children.         Return in about 6 months (around 04/15/2024).  No orders of the defined types were placed in this encounter.  Lab Orders  No laboratory test(s) ordered today    Diagnostics: Spirometry:  Tracings reviewed. Her effort: {Blank single:19197::Good reproducible efforts.,It was hard to get consistent efforts and there is a question as to whether this reflects a maximal maneuver.,Poor effort, data can not be interpreted.} FVC: ***L FEV1: ***L, ***% predicted FEV1/FVC ratio: ***% Interpretation: {Blank single:19197::Spirometry consistent with mild obstructive disease,Spirometry consistent with moderate obstructive disease,Spirometry consistent with severe obstructive disease,Spirometry consistent with possible restrictive  disease,Spirometry consistent with mixed obstructive and restrictive disease,Spirometry uninterpretable due to technique,Spirometry consistent with normal pattern,No overt abnormalities noted given today's efforts}.  Please see scanned spirometry  results for details.  Skin Testing: {Blank single:19197::Select foods,Environmental allergy  panel,Environmental allergy  panel and select foods,Food allergy  panel,None,Deferred due to recent antihistamines use}. *** Results discussed with patient/family.   Medication List:  Current Outpatient Medications  Medication Sig Dispense Refill   acetaminophen  (TYLENOL ) 500 MG tablet Take 1,000 mg by mouth as needed.     albuterol  (VENTOLIN  HFA) 108 (90 Base) MCG/ACT inhaler Inhale 2 puffs into the lungs every 4 (four) hours as needed for wheezing or shortness of breath (coughing fits). 18 g 1   Benralizumab  (FASENRA  [place]) Inject 1 Dose into the skin every 2 (two) months.     Budeson-Glycopyrrol-Formoterol  (BREZTRI  AEROSPHERE) 160-9-4.8 MCG/ACT AERO Inhale 2 puffs into the lungs in the morning and at bedtime. 10.7 g 11   Cholecalciferol 25 MCG (1000 UT) capsule Take 1 capsule by mouth daily. GEL CAPSULES     diltiazem  (CARDIZEM  CD) 300 MG 24 hr capsule TAKE 1 CAPSULE BY MOUTH EVERY Amy Stokes 90 capsule 3   dofetilide  (TIKOSYN ) 250 MCG capsule Take 1 capsule (250 mcg total) by mouth 2 (two) times daily. 180 capsule 3   ELIQUIS  5 MG TABS tablet TAKE 1 TABLET BY MOUTH TWICE A Amy Stokes 60 tablet 5   furosemide  (LASIX ) 20 MG tablet TAKE 20-40MG  AS DIRECTED BY CARDIOLOGY OFFICE. 180 tablet 3   ipratropium-albuterol  (DUONEB) 0.5-2.5 (3) MG/3ML SOLN Take 3 mLs by nebulization every 6 (six) hours as needed. 360 mL 0   levothyroxine  (SYNTHROID ) 100 MCG tablet TAKE 1 TABLET BY MOUTH EVERY MORNING ON AN EMPTY STOMACH Orally Once a Amy Stokes 90 days 90 tablet 4   losartan  (COZAAR ) 100 MG tablet Take 1 tablet (100 mg total) by mouth daily. 90 tablet 1   Lutein  10 MG TABS Take 10 mg by mouth in the morning and at bedtime. Areds 2     MAGNESIUM  PO Take 1 each by mouth daily.     MULTIPLE VITAMIN PO Take 1 tablet by mouth daily.     Omega-3 Fatty Acids (FISH OIL) 1000 MG CAPS Take 1 capsule by mouth every morning.      pantoprazole  (PROTONIX ) 40 MG tablet TAKE 1 TABLET BY MOUTH EVERY Amy Stokes 90 tablet 1   rosuvastatin  (CRESTOR ) 10 MG tablet TAKE 1 TABLET BY MOUTH EVERY Amy Stokes 90 tablet 2   Spacer/Aero-Holding Chambers DEVI Use with spacer 1 each 2   spironolactone  (ALDACTONE ) 25 MG tablet Take 1 tablet (25 mg total) by mouth daily. 90 tablet 3   valACYclovir  (VALTREX ) 500 MG tablet Take 500 mg by mouth as needed.     No current facility-administered medications for this visit.   Allergies: No Known Allergies I reviewed her past medical history, social history, family history, and environmental history and no significant changes have been reported from her previous visit.  Review of Systems  Constitutional:  Negative for appetite change, chills, fever and unexpected weight change.  HENT:  Negative for congestion and rhinorrhea.   Eyes:  Negative for itching.  Respiratory:  Positive for cough, shortness of breath and wheezing. Negative for chest tightness.   Cardiovascular:  Positive for leg swelling. Negative for chest pain.  Gastrointestinal:  Negative for abdominal pain.  Genitourinary:  Negative for difficulty urinating.  Skin:  Positive for rash.  Allergic/Immunologic: Positive for environmental allergies.  Neurological:  Negative for headaches.  Objective: BP 136/70 (BP Location: Left Arm, Patient Position: Sitting, Cuff Size: Large)   Pulse 68   Temp 98.2 F (36.8 C) (Temporal)   Resp 20   Ht 5' 7 (1.702 m)   Wt 218 lb 6.4 oz (99.1 kg)   SpO2 95%   BMI 34.21 kg/m  Body mass index is 34.21 kg/m. Physical Exam Vitals and nursing note reviewed.  Constitutional:      Appearance: Normal appearance. She is well-developed. She is obese.  HENT:     Head: Normocephalic and atraumatic.     Right Ear: Tympanic membrane and external ear normal.     Left Ear: Tympanic membrane and external ear normal.     Nose: Nose normal.     Mouth/Throat:     Mouth: Mucous membranes are moist.     Pharynx:  Oropharynx is clear.  Eyes:     Conjunctiva/sclera: Conjunctivae normal.  Cardiovascular:     Rate and Rhythm: Normal rate and regular rhythm.     Heart sounds: Normal heart sounds. No murmur heard.    No friction rub. No gallop.  Pulmonary:     Effort: Pulmonary effort is normal.     Breath sounds: Normal breath sounds. No wheezing, rhonchi or rales.  Musculoskeletal:        General: Swelling (pitting edema on lower extremities b/l.) present.     Cervical back: Neck supple.  Skin:    General: Skin is warm.     Findings: Rash present.     Comments: On lower extremities b/l - venous stasis?  Neurological:     Mental Status: She is alert and oriented to person, place, and time.  Psychiatric:        Behavior: Behavior normal.    Previous notes and tests were reviewed. The plan was reviewed with the patient/family, and all questions/concerned were addressed.  It was my pleasure to see Amy Stokes today and participate in her care. Please feel free to contact me with any questions or concerns.  Sincerely,  Orlan Cramp, DO Allergy  & Immunology  [facility]  [place] office: [phone] [facility] office: [phone]

## 2023-10-14 NOTE — Progress Notes (Signed)
 Follow Up Note  RE: Amy Stokes MRN: [id number] DOB: 1950/05/06 Date of Office Visit: 10/14/2023  Referring provider: de [place], Quintin PARAS, MD Primary care provider: de [place], Raymond J, MD  Chief Complaint: Follow-up (3 month follow ) and Asthma (No complaints )  History of Present Illness: I had the pleasure of seeing Amy Stokes for a follow up visit at the [facility] on 10/14/2023. She is a 73 y.o. female, who is being followed for asthma on Fasenra , allergic rhinitis and reflux. Her previous allergy  office visit was on 07/15/2023 with Amy Stokes. Today is a regular follow up visit.  Discussed the use of AI scribe software for clinical note transcription with the patient, who gave verbal consent to proceed.    Amy Stokes presents in follow up for her asthma rhinitis, and reflux. She has continued using breztri  two puffs twice daily and gets her Fasenra  injections. The patient has no nasal complaints including congestion, rhinorrhea. She denies shortness of breath, develops slight productive cough about once a month. Amy Stokes has occasional wheezing at rest, once or twice a month. Albuterol  helps alleviate her symptoms within minutes. She doesn't use nebulizers, nasal spray, oral antihistamine, but does use her omeprazole consistently. The patient sleeps 6-8 hours each night using a humidifed CPAP. She was recently admitted to the hospital to be started on dofetilide  for her afib which is well controlled. She remains on Eliquis  as a blood thinner.   She has received her flu shots annually and has completed her pneumonia vaccinations. She has not yet received the RSV vaccine.  Assessment and Plan: Amy Stokes is a 73 y.o. female with: Severe persistent asthma Past history - Diagnosed with asthma for 30 years.  Main triggers include dust.  Follows with pulmonology.  2023 blood work showed elevated eosinophils of 300 and IgE of 1409. 2023 spirometry showed severe  restriction with no improvement FEV1 postbronchodilator treatment.  Clinically unchanged. Started Fasenra  in June 2024. Tezspire  too expensive.  Interim history - well-controlled with Breztri  and Fasenra . No recent exacerbations or need for prednisone . Albuterol  used infrequently. Daily controller medication(s): Breztri  2 puffs twice a day with spacer and rinse mouth afterwards.  Continue Fasenra  injections every 8 weeks at home.  Monitor wheezing episodes. May use albuterol  rescue inhaler 2 puffs or nebulizer every 4 to 6 hours as needed for shortness of breath, chest tightness, coughing, and wheezing. May use albuterol  rescue inhaler 2 puffs 5 to 15 minutes prior to strenuous physical activities. Monitor frequency of use - if you need to use it more than twice per week on a consistent basis let us  know.    Allergy  to cockroaches Allergic rhinitis due to dust mite Allergic rhinitis due to mold Past history - 2023 skin testing positive to mold, cockroach, dust mites.  Interim history - stable.  Continue environmental control measures.  Use over the counter antihistamines such as Zyrtec (cetirizine), Claritin (loratadine), Allegra  (fexofenadine ), or Xyzal (levocetirizine) daily as needed. May take twice a day during allergy  flares. May switch antihistamines every few months. Use Flonase  (fluticasone ) nasal spray 1-2 sprays per nostril once a day as needed for nasal congestion.  Nasal saline spray (i.e., Simply Saline) or nasal saline lavage (i.e., NeilMed) is recommended as needed and prior to medicated nasal sprays.   Gastroesophageal reflux disease, unspecified whether esophagitis present Continue Protonix  40mg  daily as before.   Get flu shot in the fall.  Consider getting RSV vaccine.   Return in  about 6 months (around 04/15/2024).  No orders of the defined types were placed in this encounter.  Lab Orders  No laboratory test(s) ordered today    Diagnostics:  Medication List:   Current Outpatient Medications  Medication Sig Dispense Refill   acetaminophen  (TYLENOL ) 500 MG tablet Take 1,000 mg by mouth as needed.     albuterol  (VENTOLIN  HFA) 108 (90 Base) MCG/ACT inhaler Inhale 2 puffs into the lungs every 4 (four) hours as needed for wheezing or shortness of breath (coughing fits). 18 g 1   Benralizumab  (FASENRA  [place]) Inject 1 Dose into the skin every 2 (two) months.     Budeson-Glycopyrrol-Formoterol  (BREZTRI  AEROSPHERE) 160-9-4.8 MCG/ACT AERO Inhale 2 puffs into the lungs in the morning and at bedtime. 10.7 g 11   Cholecalciferol 25 MCG (1000 UT) capsule Take 1 capsule by mouth daily. GEL CAPSULES     diltiazem  (CARDIZEM  CD) 300 MG 24 hr capsule TAKE 1 CAPSULE BY MOUTH EVERY DAY 90 capsule 3   dofetilide  (TIKOSYN ) 250 MCG capsule Take 1 capsule (250 mcg total) by mouth 2 (two) times daily. 180 capsule 3   ELIQUIS  5 MG TABS tablet TAKE 1 TABLET BY MOUTH TWICE A DAY 60 tablet 5   furosemide  (LASIX ) 20 MG tablet TAKE 20-40MG  AS DIRECTED BY CARDIOLOGY OFFICE. 180 tablet 3   ipratropium-albuterol  (DUONEB) 0.5-2.5 (3) MG/3ML SOLN Take 3 mLs by nebulization every 6 (six) hours as needed. 360 mL 0   levothyroxine  (SYNTHROID ) 100 MCG tablet TAKE 1 TABLET BY MOUTH EVERY MORNING ON AN EMPTY STOMACH Orally Once a day 90 days 90 tablet 4   losartan  (COZAAR ) 100 MG tablet Take 1 tablet (100 mg total) by mouth daily. 90 tablet 1   Lutein  10 MG TABS Take 10 mg by mouth in the morning and at bedtime. Areds 2     MAGNESIUM  PO Take 1 each by mouth daily.     MULTIPLE VITAMIN PO Take 1 tablet by mouth daily.     Omega-3 Fatty Acids (FISH OIL) 1000 MG CAPS Take 1 capsule by mouth every morning.     pantoprazole  (PROTONIX ) 40 MG tablet TAKE 1 TABLET BY MOUTH EVERY DAY 90 tablet 1   rosuvastatin  (CRESTOR ) 10 MG tablet TAKE 1 TABLET BY MOUTH EVERY DAY 90 tablet 2   Spacer/Aero-Holding Chambers DEVI Use with spacer 1 each 2   spironolactone  (ALDACTONE ) 25 MG tablet Take 1 tablet (25 mg  total) by mouth daily. 90 tablet 3   valACYclovir  (VALTREX ) 500 MG tablet Take 500 mg by mouth as needed.     No current facility-administered medications for this visit.   Allergies: No Known Allergies I reviewed her past medical history, social history, family history, and environmental history and no significant changes have been reported from her previous visit.  Review of Systems  Constitutional:  Negative for appetite change, chills, fever and unexpected weight change.  HENT:  Negative for congestion and rhinorrhea.   Eyes:  Negative for itching.  Respiratory:  Positive for cough, shortness of breath and wheezing. Negative for chest tightness.   Cardiovascular:  Positive for leg swelling. Negative for chest pain.  Gastrointestinal:  Negative for abdominal pain.  Genitourinary:  Negative for difficulty urinating.  Skin:  Positive for rash.  Allergic/Immunologic: Positive for environmental allergies.  Neurological:  Negative for headaches.    Objective: BP 136/70 (BP Location: Left Arm, Patient Position: Sitting, Cuff Size: Large)   Pulse 68   Temp 98.2 F (36.8 C) (Temporal)  Resp 20   Ht 5' 7 (1.702 m)   Wt 218 lb 6.4 oz (99.1 kg)   SpO2 95%   BMI 34.21 kg/m  Body mass index is 34.21 kg/m. Physical Exam Vitals and nursing note reviewed.  Constitutional:      Appearance: Normal appearance. She is well-developed. She is obese.  HENT:     Head: Normocephalic and atraumatic.     Right Ear: Tympanic membrane and external ear normal.     Left Ear: Tympanic membrane and external ear normal.     Nose: Nose normal.     Mouth/Throat:     Mouth: Mucous membranes are moist.     Pharynx: Oropharynx is clear.  Eyes:     Conjunctiva/sclera: Conjunctivae normal.  Cardiovascular:     Rate and Rhythm: Normal rate and regular rhythm.     Heart sounds: Normal heart sounds. No murmur heard.    No friction rub. No gallop.  Pulmonary:     Effort: Pulmonary effort is normal.      Breath sounds: Normal breath sounds. No wheezing, rhonchi or rales.  Musculoskeletal:        General: Swelling (pitting edema on lower extremities b/l.) present.     Cervical back: Neck supple.  Skin:    General: Skin is warm.     Comments: On lower extremities b/l - venous stasis?  Neurological:     Mental Status: She is alert and oriented to person, place, and time.  Psychiatric:        Behavior: Behavior normal.    Previous notes and tests were reviewed. The plan was reviewed with the patient/family, and all questions/concerned were addressed.  It was my pleasure to see Amy Stokes today and participate in her care. Please feel free to contact me with any questions or concerns.  Sincerely,  Donnice Mutter, MS4 [facility]  I have provided oversight concerning Donnice Mutter, MSIV evaluation and treatment of this patient's heath issues addressed during today's encounter. We saw the patient together, performed physical exam together as well. I reviewed and added to the notes regarding the assessment and plan as outlined in the note above.  Orlan Cramp, DO Allergy  & Immunology  [facility] [facility]  [place] office: [phone] [facility] office: [phone]

## 2023-10-15 ENCOUNTER — Encounter (HOSPITAL_COMMUNITY): Payer: Self-pay | Admitting: Physician Assistant

## 2023-10-15 ENCOUNTER — Ambulatory Visit (HOSPITAL_COMMUNITY)
Admission: RE | Admit: 2023-10-15 | Discharge: 2023-10-15 | Disposition: A | Discharge status: Home / Self Care | Source: Ambulatory Visit | Attending: Physician Assistant | Admitting: Physician Assistant

## 2023-10-15 VITALS — BP 122/68 | HR 74 | Ht 67.0 in | Wt 280.8 lb

## 2023-10-15 DIAGNOSIS — D6869 Other thrombophilia: Secondary | ICD-10-CM

## 2023-10-15 DIAGNOSIS — Z79899 Other long term (current) drug therapy: Secondary | ICD-10-CM

## 2023-10-15 DIAGNOSIS — Z5181 Encounter for therapeutic drug level monitoring: Secondary | ICD-10-CM

## 2023-10-15 DIAGNOSIS — I480 Paroxysmal atrial fibrillation: Secondary | ICD-10-CM | POA: Diagnosis not present

## 2023-10-15 NOTE — Progress Notes (Signed)
 Primary Care Physician: de [place], Quintin PARAS, MD Primary Cardiologist: [name], MD Electrophysiologist: Will [name], MD  Referring Physician: Rosaline Bane NP   Amy Stokes is a 73 y.o. female with a history of HTN, OSA, CAD/aortic atherosclerosis, asthma, atrial fibrillation who presents for follow up in the [facility].  The patient was initially diagnosed with atrial fibrillation in 2022 and has been maintained on flecainide . She wore a cardiac monitor 04/2023 which showed a 36% burden of afib. Patient does note intermittent symptoms of fatigue, SOB, and an unsettled feeling in her chest. Patient is on Eliquis  for stroke prevention.   S/p dofetilide  loading 6/3-09/04/23.  Patient returns for follow up for atrial fibrillation and dofetilide  monitoring. She reports that she had a two hour afib episode detected on her smart watch on 6/30. She was asymptomatic during the episode. Otherwise she has been maintaining SR. No bleeding issues on anticoagulation.   Today, she  denies symptoms of palpitations, chest pain, shortness of breath, orthopnea, PND, lower extremity edema, dizziness, presyncope, syncope, bleeding, or neurologic sequela. The patient is tolerating medications without difficulties and is otherwise without complaint today.    Atrial Fibrillation Risk Factors:  she does have symptoms or diagnosis of sleep apnea. she does not have a history of rheumatic fever.   Atrial Fibrillation Management history:  Previous antiarrhythmic drugs: flecainide , dofetilide   Previous cardioversions: none Previous ablations: none Anticoagulation history: Eliquis   ROS- All systems are reviewed and negative except as per the HPI above.  Past Medical History:  Diagnosis Date   Arrhythmia 2020   Arthritis    no meds   Asthma    Edema    Fibroids    Hyperlipidemia    Hypertension    Obesity    Oxygen  deficiency    RML pneumonia  02/25/2021   Sleep apnea    wears cpap    Thyroid  nodule 01/2009   left discovered on chest ct; needle bx indeterminant cannon r/o follicular neoplasm     Current Outpatient Medications  Medication Sig Dispense Refill   acetaminophen  (TYLENOL ) 500 MG tablet Take 1,000 mg by mouth as needed.     albuterol  (VENTOLIN  HFA) 108 (90 Base) MCG/ACT inhaler Inhale 2 puffs into the lungs every 4 (four) hours as needed for wheezing or shortness of breath (coughing fits). 18 g 1   Benralizumab  (FASENRA  ) Inject 1 Dose into the skin every 2 (two) months.     Budeson-Glycopyrrol-Formoterol  (BREZTRI  AEROSPHERE) 160-9-4.8 MCG/ACT AERO Inhale 2 puffs into the lungs in the morning and at bedtime. 10.7 g 11   Cholecalciferol 25 MCG (1000 UT) capsule Take 1 capsule by mouth daily. GEL CAPSULES     diltiazem  (CARDIZEM  CD) 300 MG 24 hr capsule TAKE 1 CAPSULE BY MOUTH EVERY DAY 90 capsule 3   dofetilide  (TIKOSYN ) 250 MCG capsule Take 1 capsule (250 mcg total) by mouth 2 (two) times daily. 180 capsule 3   ELIQUIS  5 MG TABS tablet TAKE 1 TABLET BY MOUTH TWICE A DAY 60 tablet 5   furosemide  (LASIX ) 20 MG tablet TAKE 20-40MG  AS DIRECTED BY CARDIOLOGY OFFICE. 180 tablet 3   ipratropium-albuterol  (DUONEB) 0.5-2.5 (3) MG/3ML SOLN Take 3 mLs by nebulization every 6 (six) hours as needed. 360 mL 0   levothyroxine  (SYNTHROID ) 100 MCG tablet TAKE 1 TABLET BY MOUTH EVERY MORNING ON AN EMPTY STOMACH Orally Once a day 90 days 90 tablet 4   losartan  (COZAAR ) 100 MG tablet Take  1 tablet (100 mg total) by mouth daily. 90 tablet 1   Lutein  10 MG TABS Take 10 mg by mouth in the morning and at bedtime. Areds 2     MAGNESIUM  PO Take 1 each by mouth daily.     MULTIPLE VITAMIN PO Take 1 tablet by mouth daily.     Omega-3 Fatty Acids (FISH OIL) 1000 MG CAPS Take 1 capsule by mouth every morning.     pantoprazole  (PROTONIX ) 40 MG tablet TAKE 1 TABLET BY MOUTH EVERY DAY 90 tablet 1   rosuvastatin  (CRESTOR ) 10 MG tablet TAKE 1 TABLET  BY MOUTH EVERY DAY 90 tablet 2   Spacer/Aero-Holding Chambers DEVI Use with spacer 1 each 2   spironolactone  (ALDACTONE ) 25 MG tablet Take 1 tablet (25 mg total) by mouth daily. 90 tablet 3   valACYclovir  (VALTREX ) 500 MG tablet Take 500 mg by mouth as needed.     No current facility-administered medications for this encounter.    Physical Exam: BP 122/68   Pulse 74   Ht 5' 7 (1.702 m)   Wt 127.4 kg   BMI 43.98 kg/m   GEN: Well nourished, well developed in no acute distress CARDIAC: Regular rate and rhythm, no murmurs, rubs, gallops RESPIRATORY:  Clear to auscultation without rales, wheezing or rhonchi  ABDOMEN: Soft, non-tender, non-distended EXTREMITIES:  No edema; No deformity    Wt Readings from Last 3 Encounters:  10/15/23 127.4 kg  10/14/23 99.1 kg  10/06/23 127.4 kg     EKG today demonstrates  SR Vent. rate 74 BPM PR interval 178 ms QRS duration 72 ms QT/QTcB 432/479 ms   Echo 04/16/23 demonstrated   1. Left ventricular ejection fraction, by estimation, is 60 to 65%. The  left ventricle has normal function. The left ventricle has no regional  wall motion abnormalities. There is mild asymmetric left ventricular  hypertrophy of the inferior segment. Left ventricular diastolic parameters are indeterminate.   2. Right ventricular systolic function is normal. The right ventricular  size is moderately enlarged.   3. Left atrial size was mildly dilated.   4. The mitral valve is normal in structure. Mild mitral valve  regurgitation. No evidence of mitral stenosis.   5. The aortic valve is normal in structure. Aortic valve regurgitation is  not visualized. No aortic stenosis is present.   6. The inferior vena cava is normal in size with greater than 50%  respiratory variability, suggesting right atrial pressure of 3 mmHg.    CHA2DS2-VASc Score = 5  The patient's score is based upon: CHF History: 1 HTN History: 1 Diabetes History: 0 Stroke History: 0 Vascular  Disease History: 1 Age Score: 1 Gender Score: 1       ASSESSMENT AND PLAN: Paroxysmal Atrial Fibrillation (ICD10:  I48.0) The patient's CHA2DS2-VASc score is 5, indicating a 7.2% annual risk of stroke.   Previously failed flecainide  S/p dofetilide  loading 08/2023 Patient appears to be maintaining SR Continue dofetilide  250 mcg BID Continue diltiazem  300 mg daily Continue Eliquis  5 mg BID  Secondary Hypercoagulable State (ICD10:  D68.69) The patient is at significant risk for stroke/thromboembolism based upon her CHA2DS2-VASc Score of 5.  Continue Apixaban  (Eliquis ). No bleeding issues.   High Risk Medication Monitoring (ICD 10: Z79.899) QT interval on ECG acceptable for dofetilide  monitoring. Check bmet/mag today.     HTN Stable on current regimen  OSA  Encouraged nightly CPAP  CAD/aortic atherosclerosis No anginal symptoms Followed by Dr Lonni   Follow up in  the AF clinic in 3 months.       [name] PA-C Afib Clinic [facility] [address] [place], [place] [address] [phone]

## 2023-10-16 ENCOUNTER — Ambulatory Visit (HOSPITAL_COMMUNITY): Payer: Self-pay | Admitting: Physician Assistant

## 2023-10-16 ENCOUNTER — Other Ambulatory Visit: Payer: Self-pay | Admitting: Endocrinology

## 2023-10-16 DIAGNOSIS — E049 Nontoxic goiter, unspecified: Secondary | ICD-10-CM

## 2023-10-16 LAB — BASIC METABOLIC PANEL WITH GFR
BUN/Creatinine Ratio: 25 (ref 12–28)
BUN: 19 mg/dL (ref 8–27)
CO2: 25 mmol/L (ref 20–29)
Calcium: 9.4 mg/dL (ref 8.7–10.3)
Chloride: 96 mmol/L (ref 96–106)
Creatinine, Ser: 0.76 mg/dL (ref 0.57–1.00)
Glucose: 85 mg/dL (ref 70–99)
Potassium: 4.5 mmol/L (ref 3.5–5.2)
Sodium: 138 mmol/L (ref 134–144)
eGFR: 83 mL/min/1.73 (ref >59)

## 2023-10-16 LAB — MAGNESIUM: Magnesium: 2.1 mg/dL (ref 1.6–2.3)

## 2023-10-20 NOTE — Progress Notes (Signed)
 Cardiology Office Note:  .   Date:  10/26/2023  ID:  Amy Stokes, DOB 1950/06/30, MRN [id number] PCP: de [place], Amy PARAS, MD  [facility] Providers Cardiologist:  [name], MD Electrophysiologist:  Will [name], MD    Patient Profile: .      PMH PAF on chronic anticoagulation On flecainide  Hypertension Asthma OSA on CPAP Morbid obesity Former tobacco use Coronary artery calcification Aortic atherosclerosis Elevated lipoprotein a   Echo 10/2020 with EF 55-60%, no significant valve disease, normal RV.   Seen in clinic 03/12/2023 by Dr. Bruckner.  She had presented to the ER the day prior with shortness of breath and productive cough.  Also noted bilateral LE edema and redness.  She was given furosemide , prednisone , and doxycycline  but had not yet picked medications up.  BNP was 227.  ECG showed atrial fibrillation at 70 bpm.  CXR was unremarkable. Medications were reviewed and she was advised not to take both hydrochlorothiazide  and Lasix  on the same day.  Lower extremity edema was significant, she was advised to notify Dr. Bruckner if swelling did not resolve on 4 days of Lasix .  She contacted the office a few days later to report no significant improvement in LE edema on Lasix .  She was having some episodes of A-fib and cardiac monitor was ordered.  Cardiac monitor completed 04/27/2023 revealed predominant underlying rhythm sinus rhythm, atrial fibrillation/flutter occurred with a 36% burden with the longest episode lasting 1 day 15 hours with average HR 69 bpm.  Atrial flutter may be possible atrial tachycardia with variable block.  Echo completed 04/15/2022 revealed normal LVEF 60 to 65%, mild LVH, indeterminate diastolic parameters, moderately enlarged RV, mild LAE, mild MR.  Seen in clinic by me on 07/09/23. Cardiac monitor completed 04/27/2023 revealed 36% burden of atrial fibrillation/flutter. Noting episodes of irregular HR with HR at times going  to 100, most often at night. No episodes of significantly elevated HR.  Managing leg swelling, and monitoring weight daily. Weight generally with in 1 to 2 pounds of baseline. Takes furosemide  40 mg if weight is up 2 lb. Noted drops in her breathing (O2 sats) which occur a few times a day, often during workouts with O2 sat in the 80s but quickly rebounds to the 90s on pulse ox. Despite these episodes, she is able to complete her workouts and does not feel the need to stop. On CPAP machine with two liters of oxygen  at night and reports sleeping well, averaging six to eight hours of sleep per night. AFib episodes often occur at night.  Recent CT scan ordered by her pulmonary doctor, which revealed atherosclerosis of the aorta and coronary artery calcification. The patient is also enrolled in a clinical trial for elevated lipoprotein A with a clinical trial group. She does not recall LP(a) total. Was referred to A Fib clinic and was started on Tikosyn .   Seen by [name], PA on 10/15/23 in AF clinic and was maintaining SR on Tikosyn  250 mcg BID and diltiazem  300 mg daily.         History of Present Illness: .    History of Present Illness [name] is a pleasant 73 y.o. female who presents today for follow-up of atrial fibrillation.  Since starting dofetilide , she has experienced one brief episode of atrial fibrillation on June 30th, detected by her Fitbit. On July 3rd, her blood pressure monitor indicated an uneven heartbeat. Daily monitoring shows BP consistently < 120/80 and HR 55-  65 bpm. Remains in a 4-year study for elevated LP(a) level. Switched taking rosuvastatin  to evening without issues. She engages in exercise at [facility] three times a week that involves cardio and weight lifting. She denies chest pain, shortness of breath, orthopnea, PND, presyncope or syncope with exercise. Chronic bilateral leg edema. Overall feels that swelling/weight has been better controlled since starting  spironolactone .    Discussed the use of AI scribe software for clinical note transcription with the patient, who gave verbal consent to proceed.   ROS: See HPI       Studies Reviewed: SABRA        No results found for: LIPOA   Risk Assessment/Calculations:    CHA2DS2-VASc Score = 5   This indicates a 7.2% annual risk of stroke. The patient's score is based upon: CHF History: 1 HTN History: 1 Diabetes History: 0 Stroke History: 0 Vascular Disease History: 1 Age Score: 1 Gender Score: 1            Physical Exam:   VS:  BP (!) 128/56 (BP Location: Left Arm, Patient Position: Sitting, Cuff Size: Large)   Pulse 83   Ht 5' 7 (1.702 m)   Wt 282 lb 6.4 oz (128.1 kg)   SpO2 92%   BMI 44.23 kg/m    Wt Readings from Last 3 Encounters:  10/26/23 282 lb 6.4 oz (128.1 kg)  10/21/23 281 lb (127.5 kg)  10/15/23 280 lb 12.8 oz (127.4 kg)    GEN: Obese, well developed in no acute distress NECK: No JVD; No carotid bruits CARDIAC: RRR, no murmurs, rubs, gallops RESPIRATORY:  Clear to auscultation without rales, wheezing or rhonchi  ABDOMEN: Soft, non-tender, non-distended EXTREMITIES:  RLE bandaged (recent stitches), bilateral erythema, mild edema     ASSESSMENT AND PLAN: .    Assessment & Plan Atrial Fibrillation on chronic anticoagulation Now on Tikosyn  with only one recent exacerbation of a fib. HR is closely monitored at home and is well controlled. No bleeding concerns.  -Continue Eliquis  5 mg twice daily which is appropriate dose for stroke prevention for CHA2DS2-VASc score of 5. -Maintain regular follow-up with A Fib clinic -Continue diltiazem  for rate control -Continue Tikosyn  for rhythm control  Chronic HFpEF/Fluid Retention   Chronic bilateral LE edema that she feels is stable. Weight is stable. She denies dyspnea, orthopnea, PND, chest pain.  LVEF 60-65%, indeterminate diastolic parameters on echo 04/16/23. Renal function stable on labs completed 10/15/23.  -Continue  daily weights -Low sodium diet encouraged -Continue Lasix , spironolactone , losartan  -Elevate legs when sedentary  Aortic Atherosclerosis/Coronary calcification CT scan shows aortic atherosclerosis and coronary artery calcification. She remains active with regular exercise and denies chest pain, dyspnea, or other symptoms concerning for angina.  No indication for further ischemic evaluation at this time. Not on asa due to OAC.  -We will get lipid panel for surveillance -Continue losartan , rosuvastatin , spironolactone   Hyperlipidemia LDL goal < 70 Lipid panel completed 11/13/2022 with total cholesterol 142, HDL 44, and LDL 84.  Not directly addressed today.  Continue rosuvastatin .  -Encouraged heart healthy diet avoiding saturated fat, processed foods, sugar and simple carbohydrates -Get NMR and LFT when she can return fasting -Continue rosuvastatin , fish oil  Elevated Lipoprotein(a)   Elevated lipoprotein(a) is being monitored with ongoing participation in a clinical trial. Continue participation in the clinical trial. -NMR for surveillance of lipid levels  Hypertension   Hypertension is well-controlled with the current medication. Renal function stable on labs completed 10/15/23.  -Continue losartan ,  spironolactone , Lasix    Obstructive Sleep Apnea   Obstructive sleep apnea is well-managed with CPAP therapy, and she reports good sleep quality. -Continue CPAP therapy.        Disposition: 6 months with Dr. Lonni or APP  Signed, Rosaline Bane, NP-C

## 2023-10-21 ENCOUNTER — Telehealth (HOSPITAL_BASED_OUTPATIENT_CLINIC_OR_DEPARTMENT_OTHER): Payer: Self-pay | Admitting: *Deleted

## 2023-10-21 ENCOUNTER — Encounter (HOSPITAL_BASED_OUTPATIENT_CLINIC_OR_DEPARTMENT_OTHER): Payer: Self-pay | Admitting: Emergency Medicine

## 2023-10-21 ENCOUNTER — Other Ambulatory Visit: Payer: Self-pay

## 2023-10-21 ENCOUNTER — Emergency Department (HOSPITAL_BASED_OUTPATIENT_CLINIC_OR_DEPARTMENT_OTHER)
Admission: EM | Admit: 2023-10-21 | Discharge: 2023-10-21 | Disposition: A | Discharge status: Home / Self Care | Attending: Emergency Medicine | Admitting: Emergency Medicine

## 2023-10-21 DIAGNOSIS — S81811A Laceration without foreign body, right lower leg, initial encounter: Secondary | ICD-10-CM | POA: Insufficient documentation

## 2023-10-21 DIAGNOSIS — S8991XA Unspecified injury of right lower leg, initial encounter: Secondary | ICD-10-CM | POA: Diagnosis not present

## 2023-10-21 DIAGNOSIS — W208XXA Other cause of strike by thrown, projected or falling object, initial encounter: Secondary | ICD-10-CM | POA: Diagnosis not present

## 2023-10-21 DIAGNOSIS — Z7901 Long term (current) use of anticoagulants: Secondary | ICD-10-CM | POA: Diagnosis not present

## 2023-10-21 MED ORDER — LIDOCAINE-EPINEPHRINE (PF) 2 %-1:200000 IJ SOLN
10.0000 mL | Freq: Once | INTRAMUSCULAR | Status: AC
  Administered 2023-10-21: 10 mL
  Filled 2023-10-21: qty 20

## 2023-10-21 NOTE — ED Provider Notes (Signed)
 [place] EMERGENCY DEPARTMENT AT [facility] Provider Note   CSN: [id number] Arrival date & time: 10/21/23  1019     Patient presents with: Laceration   Amy Stokes is a 73 y.o. female with history of A-fib on Eliquis , presents with concern for a cut to her right lower leg that occurred just prior to arrival.  States she had a wooden board that fell onto her leg.  States she has been able to walk on the leg without difficulty.  Bleeding well-controlled upon arrival.  Last tetanus was updated in 2023.    Laceration      Prior to Admission medications   Medication Sig Start Date End Date Taking? Authorizing Provider  acetaminophen  (TYLENOL ) 500 MG tablet Take 1,000 mg by mouth as needed.    [provider]  albuterol  (VENTOLIN  HFA) 108 (90 Base) MCG/ACT inhaler Inhale 2 puffs into the lungs every 4 (four) hours as needed for wheezing or shortness of breath (coughing fits). 07/15/23   Cheryl Reusing, FNP  Benralizumab  (FASENRA  [place]) Inject 1 Dose into the skin every 2 (two) months.    [provider]  Budeson-Glycopyrrol-Formoterol  (BREZTRI  AEROSPHERE) 160-9-4.8 MCG/ACT AERO Inhale 2 puffs into the lungs in the morning and at bedtime. 03/06/23   Luke Orlan HERO, DO  Cholecalciferol 25 MCG (1000 UT) capsule Take 1 capsule by mouth daily. GEL CAPSULES    [provider]  diltiazem  (CARDIZEM  CD) 300 MG 24 hr capsule TAKE 1 CAPSULE BY MOUTH EVERY DAY 11/05/22   Lonni Slain, MD  dofetilide  (TIKOSYN ) 250 MCG capsule Take 1 capsule (250 mcg total) by mouth 2 (two) times daily. 09/14/23   Fenton, Clint R, PA  ELIQUIS  5 MG TABS tablet TAKE 1 TABLET BY MOUTH TWICE A DAY 07/20/23   Lonni Slain, MD  furosemide  (LASIX ) 20 MG tablet TAKE 20-40MG  AS DIRECTED BY CARDIOLOGY OFFICE. 07/27/23   Swinyer, Rosaline HERO, NP  ipratropium-albuterol  (DUONEB) 0.5-2.5 (3) MG/3ML SOLN Take 3 mLs by nebulization every 6 (six) hours as needed. 02/28/21   Tobie Yetta HERO, MD  levothyroxine  (SYNTHROID ) 100 MCG tablet TAKE 1 TABLET BY MOUTH EVERY MORNING ON AN EMPTY STOMACH Orally Once a day 90 days 04/16/21     losartan  (COZAAR ) 100 MG tablet Take 1 tablet (100 mg total) by mouth daily. 08/13/23   de [place], Raymond J, MD  Lutein  10 MG TABS Take 10 mg by mouth in the morning and at bedtime. Areds 2    [provider]  MAGNESIUM  PO Take 1 each by mouth daily.    [provider]  MULTIPLE VITAMIN PO Take 1 tablet by mouth daily.    [provider]  Omega-3 Fatty Acids (FISH OIL) 1000 MG CAPS Take 1 capsule by mouth every morning. 11/03/22   [provider]  pantoprazole  (PROTONIX ) 40 MG tablet TAKE 1 TABLET BY MOUTH EVERY DAY 07/12/23   Neda Hammond A, MD  rosuvastatin  (CRESTOR ) 10 MG tablet TAKE 1 TABLET BY MOUTH EVERY DAY 07/13/23   Walker, Caitlin S, NP  Spacer/Aero-Holding [facility] Use with spacer 06/04/21   Cobb, Comer GAILS, NP  spironolactone  (ALDACTONE ) 25 MG tablet Take 1 tablet (25 mg total) by mouth daily. 07/09/23 10/15/23  Swinyer, Rosaline HERO, NP  valACYclovir  (VALTREX ) 500 MG tablet Take 500 mg by mouth as needed. 04/08/21   [provider]    Allergies: Patient has no known allergies.    Review of Systems  Skin:  Positive for wound.  Updated Vital Signs BP (!) 169/61 (BP Location: Right Arm)   Pulse 80   Temp 98 F (36.7 C) (Oral)   Resp 20   Ht 5' 7 (1.702 m)   Wt 127.5 kg   SpO2 94%   BMI 44.01 kg/m   Physical Exam Vitals and nursing note reviewed.  Constitutional:      Appearance: Normal appearance.  HENT:     Head: Atraumatic.  Cardiovascular:     Comments: 2+ pedal pulses of the right lower extremity Pulmonary:     Effort: Pulmonary effort is normal.  Musculoskeletal:     Comments: Nontender to the tibia diffusely  Skin:    Comments: Large dog-ear like laceration/ skin tear to the right anterior shin, approximately 8cm. Bleeding well controlled.  No foreign debris   Neurological:     General: No focal deficit present.     Mental Status: She is alert.     Comments: Intact sensation in the right lower extremity  Psychiatric:        Mood and Affect: Mood normal.        Behavior: Behavior normal.        (all labs ordered are listed, but only abnormal results are displayed) Labs Reviewed - No data to display  EKG: None  Radiology: No results found.   .Laceration Repair  Date/Time: 10/21/2023 12:21 PM  Performed by: Veta Palma, PA-C Authorized by: Veta Palma, PA-C   Consent:    Consent obtained:  Verbal   Consent given by:  Patient   Risks, benefits, and alternatives were discussed: yes     Risks discussed:  Infection, poor cosmetic result and pain   Alternatives discussed:  No treatment Universal protocol:    Procedure explained and questions answered to patient or proxy's satisfaction: yes     Patient identity confirmed:  Verbally with patient Anesthesia:    Anesthesia method:  Local infiltration   Local anesthetic:  Lidocaine  2% WITH epi Laceration details:    Location:  Leg   Leg location:  R lower leg   Length (cm):  8 Pre-procedure details:    Preparation:  Patient was prepped and draped in usual sterile fashion Exploration:    Wound extent: fascia not violated, no foreign body, no signs of injury, no nerve damage, no tendon damage, no underlying fracture and no vascular damage     Contaminated: no   Treatment:    Area cleansed with:  Chlorhexidine   Amount of cleaning:  Standard   Irrigation solution:  Sterile saline   Irrigation volume:  1L   Irrigation method:  Pressure wash   Debridement:  None   Undermining:  None Skin repair:    Repair method:  Sutures   Suture size:  5-0   Suture material:  Prolene   Suture technique:  Simple interrupted   Number of sutures:  18 Repair type:    Repair type:  Intermediate Post-procedure details:    Dressing:  Antibiotic ointment and non-adherent dressing    Procedure completion:  Tolerated well, no immediate complications    Medications Ordered in the ED  lidocaine -EPINEPHrine  (XYLOCAINE  W/EPI) 2 %-1:200000 (PF) injection 10 mL (10 mLs Infiltration Given 10/21/23 1103)                                    Medical Decision Making Risk Prescription drug management.    Differential diagnosis includes but is  not limited to laceration, wound infection, tendon injury, nerve injury, vascular injury, retained foreign body, fracture, dislocation  ED Course:  Upon initial evaluation, patient is well-appearing, no acute distress.  Has a large dog ear type laceration/ skin tear to the right anterior shin.  Approximately 8 cm in total of skin defect.  Bleeding well-controlled.  Full wound was visualized and no foreign debris noted and tear is fairly superficial, do not feel we need any x-ray imaging at this time. No visualized bone, muscle, or tendon.  Patient declines any pain medication at this time.    Medications Given: None  Patient's wound was irrigated well with sterile saline and cleaned with chlorhexidine swabs. Anesthesia achieved with lidocaine  with epi. Patient's laceration was repaired with 18 simple interrupted sutures. Patient tolerated this well without any immediate complications. Suture repair occurred less than 24 hours after initial injury. Patient remains neurovascularly intact after suture placement. Patient's Tdap most recently updated 2023. Patient stable and appropriate for discharge home.     Impression: Right shin Laceration  Disposition:  Patient discharged home with instructions to keep laceration site clean with gentle soap and water. Apply neosporin or bacitracin over the area as shown here and keep covered with sterile dressing. Follow up with PCP or return to ER in 7-10 days for suture/staple removal. Tylenol  and ibuprofen  as needed for pain.  Return precautions given.      This chart was dictated using voice  recognition software, Dragon. Despite the best efforts of this provider to proofread and correct errors, errors may still occur which can change documentation meaning.       Final diagnoses:  Laceration of right leg excluding thigh, initial encounter    ED Discharge Orders     None          Veta Palma, PA-C 10/21/23 1225    Emil Share, DO 10/21/23 1312

## 2023-10-21 NOTE — ED Triage Notes (Signed)
 Pt caox4, ambulatory c/o lac from wood board to R lower leg approx 1 hr ago. Takes Eliquis . Last Tdap 11/2021.

## 2023-10-21 NOTE — Discharge Instructions (Signed)
 You had 18 non-absorbable sutures placed today. You must get your sutures rechecked for possible removal in 7-10 days. We recommend visiting your PCP or an urgent care for suture removal. However, you may also return back to the ER if you are unable to be seen by your PCP or at urgent care.   You may gently clean the area around your laceration as needed with water. Place antibiotic ointment such as bacitracin or neosporin over your laceration after cleaning the area.  Keep the laceration covered with non-stick sterile gauze as shown here if you are doing an activity in which it may get dirty. You may pick these supplies up at any drugstore.  Do not submerge your laceration in water (no baths, swimming) until it is fully healed. You may shower.   You may take up to 1000mg  of tylenol  every 6 hours as needed for pain. Do not take more then 4g per day.    Return to the ER should you develop fever, chills, pus drainage from your wound, redness around your wound.

## 2023-10-21 NOTE — Telephone Encounter (Signed)
 Copied from CRM #[id number]. Topic: General - Other >> Oct 21, 2023  3:41 PM Carlatta H wrote: Reason for CRM: Please call patient to schedule Hospital follow up//Removal of stitches//No time slots available

## 2023-10-21 NOTE — Telephone Encounter (Signed)
 No available appts. Randall called pt to let her know this and let her know that we would check with CMA to see if we might be able to fit her in for an appt.  Routing to [place] for help with this.

## 2023-10-23 ENCOUNTER — Other Ambulatory Visit (HOSPITAL_BASED_OUTPATIENT_CLINIC_OR_DEPARTMENT_OTHER): Payer: Self-pay | Admitting: Cardiology

## 2023-10-23 DIAGNOSIS — I480 Paroxysmal atrial fibrillation: Secondary | ICD-10-CM

## 2023-10-26 ENCOUNTER — Encounter (HOSPITAL_BASED_OUTPATIENT_CLINIC_OR_DEPARTMENT_OTHER): Payer: Self-pay | Admitting: Nurse Practitioner

## 2023-10-26 ENCOUNTER — Ambulatory Visit (HOSPITAL_BASED_OUTPATIENT_CLINIC_OR_DEPARTMENT_OTHER): Admitting: Nurse Practitioner

## 2023-10-26 VITALS — BP 128/56 | HR 83 | Ht 67.0 in | Wt 282.4 lb

## 2023-10-26 DIAGNOSIS — R600 Localized edema: Secondary | ICD-10-CM | POA: Diagnosis not present

## 2023-10-26 DIAGNOSIS — G4733 Obstructive sleep apnea (adult) (pediatric): Secondary | ICD-10-CM

## 2023-10-26 DIAGNOSIS — I480 Paroxysmal atrial fibrillation: Secondary | ICD-10-CM

## 2023-10-26 DIAGNOSIS — E785 Hyperlipidemia, unspecified: Secondary | ICD-10-CM

## 2023-10-26 DIAGNOSIS — Z7901 Long term (current) use of anticoagulants: Secondary | ICD-10-CM

## 2023-10-26 DIAGNOSIS — I700 Atherosclerosis of aorta: Secondary | ICD-10-CM

## 2023-10-26 DIAGNOSIS — E7841 Elevated Lipoprotein(a): Secondary | ICD-10-CM

## 2023-10-26 DIAGNOSIS — I5032 Chronic diastolic (congestive) heart failure: Secondary | ICD-10-CM

## 2023-10-26 DIAGNOSIS — I2510 Atherosclerotic heart disease of native coronary artery without angina pectoris: Secondary | ICD-10-CM

## 2023-10-26 NOTE — Patient Instructions (Signed)
 Medication Instructions:   Your physician recommends that you continue on your current medications as directed. Please refer to the Current Medication list given to you today.  *If you need a refill on your cardiac medications before your next appointment, please call your pharmacy*  Lab Work:  Your physician recommends that you return for a FASTING NMR/ALT. Fasting after midnight.    If you have labs (blood work) drawn today and your tests are completely normal, you will receive your results only by: MyChart Message (if you have MyChart) OR A paper copy in the mail If you have any lab test that is abnormal or we need to change your treatment, we will call you to review the results.  Testing/Procedures:  None ordered.  Follow-Up: At [facility], you and your health needs are our priority.  As part of our continuing mission to provide you with exceptional heart care, our providers are all part of one team.  This team includes your primary Cardiologist (physician) and Advanced Practice Providers or APPs (Physician Assistants and Nurse Practitioners) who all work together to provide you with the care you need, when you need it.  Your next appointment:   6 month(s)  Provider:   [name], MD, Rosaline Bane, NP, or Reche Finder, NP    We recommend signing up for the patient portal called MyChart.  Sign up information is provided on this After Visit Summary.  MyChart is used to connect with patients for Virtual Visits (Telemedicine).  Patients are able to view lab/test results, encounter notes, upcoming appointments, etc.  Non-urgent messages can be sent to your provider as well.   To learn more about what you can do with MyChart, go to [email or web address].   Other Instructions  Your physician wants you to follow-up in: 6 months.  You will receive a reminder letter in the mail two months in advance. If you don't receive a letter, please call our office to  schedule the follow-up appointment.

## 2023-10-27 DIAGNOSIS — Z79899 Other long term (current) drug therapy: Secondary | ICD-10-CM | POA: Diagnosis not present

## 2023-10-27 DIAGNOSIS — I700 Atherosclerosis of aorta: Secondary | ICD-10-CM | POA: Diagnosis not present

## 2023-10-27 DIAGNOSIS — I2510 Atherosclerotic heart disease of native coronary artery without angina pectoris: Secondary | ICD-10-CM | POA: Diagnosis not present

## 2023-10-28 LAB — HEPATIC FUNCTION PANEL
ALT: 9 IU/L (ref 0–32)
AST: 12 IU/L (ref 0–40)
Albumin: 3.7 g/dL — ABNORMAL LOW (ref 3.8–4.8)
Alkaline Phosphatase: 92 IU/L (ref 44–121)
Bilirubin Total: 0.6 mg/dL (ref 0.0–1.2)
Bilirubin, Direct: 0.21 mg/dL (ref 0.00–0.40)
Total Protein: 6.1 g/dL (ref 6.0–8.5)

## 2023-10-29 ENCOUNTER — Encounter (HOSPITAL_BASED_OUTPATIENT_CLINIC_OR_DEPARTMENT_OTHER): Payer: Self-pay | Admitting: Family Medicine

## 2023-10-29 ENCOUNTER — Ambulatory Visit: Payer: Self-pay | Admitting: Nurse Practitioner

## 2023-10-29 ENCOUNTER — Ambulatory Visit (INDEPENDENT_AMBULATORY_CARE_PROVIDER_SITE_OTHER): Admitting: Family Medicine

## 2023-10-29 VITALS — BP 132/51 | HR 70 | Ht 67.0 in | Wt 285.2 lb

## 2023-10-29 DIAGNOSIS — S81819A Laceration without foreign body, unspecified lower leg, initial encounter: Secondary | ICD-10-CM | POA: Diagnosis not present

## 2023-10-29 MED ORDER — CEPHALEXIN 500 MG PO CAPS
500.0000 mg | ORAL_CAPSULE | Freq: Four times a day (QID) | ORAL | 0 refills | Status: AC
Start: 2023-10-29 — End: 2023-11-05

## 2023-10-29 NOTE — Progress Notes (Signed)
    Procedures performed today:    None.  Independent interpretation of notes and tests performed by another provider:   None.  Brief History, Exam, Impression, and Recommendations:    BP (!) 132/51 (BP Location: Left Arm, Patient Position: Sitting, Cuff Size: Large)   Pulse 70   Ht 5' 7 (1.702 m)   Wt 285 lb 3.2 oz (129.4 kg)   SpO2 97%   BMI 44.67 kg/m   Patient was seen in the emergency department after having wooden board fall onto her leg.  She did have injury to right anterior shin.  Due to laceration present, she had sutures placed in the emergency department with recommendation to have these removed in about 7 to 10 days.  She presents today for removal of the sutures.  She denies any significant issues since ED visit.  She reports that things have generally been going well.  She has not had significant pain, no significant swelling.  She has had some redness around wound.  She indicates that this has been fairly stable, however did have slight increase in redness compared to yesterday she thinks.  No fever, chills, sweats.  She has been keeping the area clean, dry.  She has not submerge this in water.  She has been applying topical antibiotic ointment. On exam, sutures currently in place across duration of laceration that is semicircular in shape.  No current oozing or drainage.  Does have mild redness extending away from laceration with some warmth present, no significant tenderness to palpation through this area.  Laceration of shin Assessment & Plan: Sutures removed in office today.  Area was cleaned with prep pad.  Sutures removed utilizing suture removal kit.  Patient tolerated procedure well, no significant bleeding or pain elicited. Given observed redness and in particular given that she has noted slight increase as compared to yesterday, we will send prescription to pharmacy for antibiotics.  Discussed that if redness persist or if she does have any worsening of the next  24 to 48 hours, recommend starting oral antibiotics.  Once started, finish course of antibiotics to completion.  If noting any significant issues such as fever, chills, sweats, sudden increase in pain, recommend returning for further evaluation or presenting to emergency department.  Discussed precautions related to wound care, would not submerge area for at least 48 hours.   Other orders -     Cephalexin ; Take 1 capsule (500 mg total) by mouth 4 (four) times daily for 7 days.  Dispense: 28 capsule; Refill: 0  Return if symptoms worsen or fail to improve.   ___________________________________________ Rebie Peale de Peru, MD, ABFM, CAQSM Primary Care and Sports Medicine [facility]

## 2023-10-29 NOTE — Assessment & Plan Note (Signed)
 Sutures removed in office today.  Area was cleaned with prep pad.  Sutures removed utilizing suture removal kit.  Patient tolerated procedure well, no significant bleeding or pain elicited. Given observed redness and in particular given that she has noted slight increase as compared to yesterday, we will send prescription to pharmacy for antibiotics.  Discussed that if redness persist or if she does have any worsening of the next 24 to 48 hours, recommend starting oral antibiotics.  Once started, finish course of antibiotics to completion.  If noting any significant issues such as fever, chills, sweats, sudden increase in pain, recommend returning for further evaluation or presenting to emergency department.  Discussed precautions related to wound care, would not submerge area for at least 48 hours.

## 2023-10-29 NOTE — Patient Instructions (Signed)

## 2023-11-02 DIAGNOSIS — I480 Paroxysmal atrial fibrillation: Secondary | ICD-10-CM | POA: Diagnosis not present

## 2023-11-02 DIAGNOSIS — D6869 Other thrombophilia: Secondary | ICD-10-CM | POA: Diagnosis not present

## 2023-11-03 ENCOUNTER — Other Ambulatory Visit

## 2023-11-03 LAB — BASIC METABOLIC PANEL WITH GFR
BUN/Creatinine Ratio: 19 (ref 12–28)
BUN: 14 mg/dL (ref 8–27)
CO2: 25 mmol/L (ref 20–29)
Calcium: 9 mg/dL (ref 8.7–10.3)
Chloride: 98 mmol/L (ref 96–106)
Creatinine, Ser: 0.74 mg/dL (ref 0.57–1.00)
Glucose: 96 mg/dL (ref 70–99)
Potassium: 4 mmol/L (ref 3.5–5.2)
Sodium: 139 mmol/L (ref 134–144)
eGFR: 86 mL/min/1.73 (ref >59)

## 2023-11-03 LAB — MAGNESIUM: Magnesium: 2.2 mg/dL (ref 1.6–2.3)

## 2023-11-04 ENCOUNTER — Inpatient Hospital Stay (HOSPITAL_BASED_OUTPATIENT_CLINIC_OR_DEPARTMENT_OTHER): Admitting: Family Medicine

## 2023-11-05 ENCOUNTER — Ambulatory Visit: Admitting: Pulmonary Disease

## 2023-11-05 VITALS — BP 109/58 | HR 70 | Ht 67.0 in | Wt 286.0 lb

## 2023-11-05 DIAGNOSIS — J4540 Moderate persistent asthma, uncomplicated: Secondary | ICD-10-CM | POA: Diagnosis not present

## 2023-11-05 DIAGNOSIS — Z6841 Body Mass Index (BMI) 40.0 and over, adult: Secondary | ICD-10-CM | POA: Diagnosis not present

## 2023-11-05 DIAGNOSIS — J9611 Chronic respiratory failure with hypoxia: Secondary | ICD-10-CM

## 2023-11-05 DIAGNOSIS — Z87891 Personal history of nicotine dependence: Secondary | ICD-10-CM

## 2023-11-05 DIAGNOSIS — E8770 Fluid overload, unspecified: Secondary | ICD-10-CM

## 2023-11-05 DIAGNOSIS — R600 Localized edema: Secondary | ICD-10-CM

## 2023-11-05 DIAGNOSIS — J9601 Acute respiratory failure with hypoxia: Secondary | ICD-10-CM | POA: Diagnosis not present

## 2023-11-05 DIAGNOSIS — J4550 Severe persistent asthma, uncomplicated: Secondary | ICD-10-CM | POA: Diagnosis not present

## 2023-11-05 DIAGNOSIS — G4733 Obstructive sleep apnea (adult) (pediatric): Secondary | ICD-10-CM

## 2023-11-05 DIAGNOSIS — R768 Other specified abnormal immunological findings in serum: Secondary | ICD-10-CM | POA: Diagnosis not present

## 2023-11-05 DIAGNOSIS — R0609 Other forms of dyspnea: Secondary | ICD-10-CM

## 2023-11-05 NOTE — Patient Instructions (Signed)
 Your CPAP download shows it is working well  Continue using your CPAP every night  Continue your inhalers  With good control of events, I will see you back in about a year  You are welcome to give us  a call with any concerns

## 2023-11-05 NOTE — Progress Notes (Signed)
 Amy Stokes    [id number]    March 26, 1951  Primary Care Physician:de Peru, Quintin PARAS, MD  Referring Physician: de Peru, Quintin PARAS, MD [address] [place],  [place] [address]  Chief complaint:   Known obstructive sleep apnea Breathing remains stable Some wheezing today  HPI:  Continues to tolerate CPAP well  Has no issues with CPAP  Breathing has been relatively stable On Breztri , on Fasenra    She continues to stay active  Reformed smoker, quit in 1973 No pertinent occupational history  Wheezing on the day of the visit but feeling relatively well with no exacerbation of symptoms   Outpatient Encounter Medications as of 11/05/2023  Medication Sig   acetaminophen  (TYLENOL ) 500 MG tablet Take 1,000 mg by mouth as needed.   albuterol  (VENTOLIN  HFA) 108 (90 Base) MCG/ACT inhaler Inhale 2 puffs into the lungs every 4 (four) hours as needed for wheezing or shortness of breath (coughing fits).   Benralizumab  (FASENRA  [place]) Inject 1 Dose into the skin every 2 (two) months.   Budeson-Glycopyrrol-Formoterol  (BREZTRI  AEROSPHERE) 160-9-4.8 MCG/ACT AERO Inhale 2 puffs into the lungs in the morning and at bedtime.   cephALEXin  (KEFLEX ) 500 MG capsule Take 1 capsule (500 mg total) by mouth 4 (four) times daily for 7 days.   Cholecalciferol 25 MCG (1000 UT) capsule Take 1 capsule by mouth daily. GEL CAPSULES   diltiazem  (CARDIZEM  CD) 300 MG 24 hr capsule TAKE 1 CAPSULE BY MOUTH EVERY DAY   dofetilide  (TIKOSYN ) 250 MCG capsule Take 1 capsule (250 mcg total) by mouth 2 (two) times daily.   ELIQUIS  5 MG TABS tablet TAKE 1 TABLET BY MOUTH TWICE A DAY   furosemide  (LASIX ) 20 MG tablet TAKE 20-40MG  AS DIRECTED BY CARDIOLOGY OFFICE.   ipratropium-albuterol  (DUONEB) 0.5-2.5 (3) MG/3ML SOLN Take 3 mLs by nebulization every 6 (six) hours as needed.   levothyroxine  (SYNTHROID ) 100 MCG tablet TAKE 1 TABLET BY MOUTH EVERY MORNING ON AN EMPTY STOMACH Orally Once a day 90 days   losartan   (COZAAR ) 100 MG tablet Take 1 tablet (100 mg total) by mouth daily.   Lutein  10 MG TABS Take 10 mg by mouth in the morning and at bedtime. Areds 2   MAGNESIUM  PO Take 1 each by mouth daily.   MULTIPLE VITAMIN PO Take 1 tablet by mouth daily.   Omega-3 Fatty Acids (FISH OIL) 1000 MG CAPS Take 1 capsule by mouth every morning.   pantoprazole  (PROTONIX ) 40 MG tablet TAKE 1 TABLET BY MOUTH EVERY DAY   rosuvastatin  (CRESTOR ) 10 MG tablet TAKE 1 TABLET BY MOUTH EVERY DAY   Spacer/Aero-Holding Chambers DEVI Use with spacer   spironolactone  (ALDACTONE ) 25 MG tablet Take 1 tablet (25 mg total) by mouth daily.   valACYclovir  (VALTREX ) 500 MG tablet Take 500 mg by mouth as needed.   No facility-administered encounter medications on file as of 11/05/2023.    Allergies as of 11/05/2023   (No Known Allergies)    Past Medical History:  Diagnosis Date   Arrhythmia 2020   Arthritis    no meds   Asthma    Cataract    Edema    Fibroids    Hyperlipidemia    Hypertension    Obesity    Oxygen  deficiency    RML pneumonia 02/25/2021   Sleep apnea    wears cpap    Thyroid  nodule 01/2009   left discovered on chest ct; needle bx indeterminant cannon r/o follicular  neoplasm     Past Surgical History:  Procedure Laterality Date   ABDOMINAL HYSTERECTOMY Bilateral 08/02/2013   Procedure: TOTAL ABDOMINAL HYSTERECTOMY WITH BILATERAL SALPINGO-OOPHORECTOMY ;  Surgeon: Curlee VEAR Guan, MD;  Location: [facility];  Service: Gynecology;  Laterality: Bilateral;    Dr. Rockney to assist.  Dr. Lily to follow with an umbilical hernia repair. He will need an additional 1 1/2 hours.    COLONOSCOPY  12/27/2007   [facility]    HERNIA REPAIR     MOUTH SURGERY     thyroid  needle bx     TONSILLECTOMY     TOTAL ABDOMINAL HYSTERECTOMY Bilateral    UMBILICAL HERNIA REPAIR N/A 08/02/2013   Procedure: HERNIA REPAIR UMBILICAL ADULT WITH MESH;  Surgeon: Krystal JINNY Lily, MD;  Location: [facility];  Service: General;   Laterality: N/A;    Family History  Problem Relation Age of Onset   Arthritis Mother    Other Mother        neurological disorder   Pancreatic cancer Mother    Heart disease Father    Thyroid  nodules Sister    Heart disease Brother        x2 1 brother deceased   Heart disease Brother    Colon cancer Neg Hx    Colon polyps Neg Hx    Esophageal cancer Neg Hx    Rectal cancer Neg Hx    Stomach cancer Neg Hx    BRCA 1/2 Neg Hx    Breast cancer Neg Hx     Social History   Socioeconomic History   Marital status: Married    Spouse name: Not on file   Number of children: 0   Years of education: Not on file   Highest education level: Associate degree: academic program  Occupational History   Occupation: retired  Tobacco Use   Smoking status: Former    Current packs/day: 0.00    Average packs/day: 1 pack/day for 1 year (1.0 ttl pk-yrs)    Types: Cigarettes    Start date: 04/28/1970    Quit date: 04/29/1971    Years since quitting: 52.5    Passive exposure: Past   Smokeless tobacco: Never   Tobacco comments:    Former smoker 07/14/23  Vaping Use   Vaping status: Never Used  Substance and Sexual Activity   Alcohol use: Not Currently    Alcohol/week: 1.0 standard drink of alcohol    Types: 1 Cans of beer per week    Comment: once a month   Drug use: No   Sexual activity: Yes    Birth control/protection: Post-menopausal  Other Topics Concern   Not on file  Social History Narrative   Married   Occupation: Advertising account executive currently unemployed   Married   Regular exercise-no   HH of 2   Pet cat      Social Drivers of Corporate investment banker Strain: Low Risk  (10/02/2023)   Overall Financial Resource Strain (CARDIA)    Difficulty of Paying Living Expenses: Not very hard  Food Insecurity: No Food Insecurity (10/02/2023)   Hunger Vital Sign    Worried About Running Out of Food in the Last Year: Never true    Ran Out of Food in the Last Year: Never true   Transportation Needs: No Transportation Needs (10/02/2023)   PRAPARE - Administrator, Civil Service (Medical): No    Lack of Transportation (Non-Medical): No  Physical Activity: Sufficiently Active (10/02/2023)   Exercise  Vital Sign    Days of Exercise per Week: 3 days    Minutes of Exercise per Session: 60 min  Stress: Stress Concern Present (10/02/2023)   [employer] of Occupational Health - Occupational Stress Questionnaire    Feeling of Stress: To some extent  Social Connections: Moderately Isolated (10/02/2023)   Social Connection and Isolation Panel    Frequency of Communication with Friends and Family: More than three times a week    Frequency of Social Gatherings with Friends and Family: Once a week    Attends Religious Services: Never    Database administrator or Organizations: No    Attends Engineer, structural: Not on file    Marital Status: Married  Catering manager Violence: Not At Risk (09/08/2023)   Humiliation, Afraid, Rape, and Kick questionnaire    Fear of Current or Ex-Partner: No    Emotionally Abused: No    Physically Abused: No    Sexually Abused: No    Review of Systems  Constitutional:  Negative for fatigue.  HENT: Negative.    Eyes: Negative.   Respiratory: Negative.  Negative for shortness of breath.   Cardiovascular: Negative.  Negative for leg swelling.  Gastrointestinal: Negative.   Endocrine: Negative.   Genitourinary: Negative.     Vitals:   11/05/23 0916  BP: (!) 109/58  Pulse: 70  SpO2: 97%     Physical Exam Constitutional:      Appearance: She is well-developed. She is obese.  HENT:     Head: Normocephalic and atraumatic.     Mouth/Throat:     Mouth: Mucous membranes are moist.  Eyes:     General:        Right eye: No discharge.        Left eye: No discharge.     Conjunctiva/sclera: Conjunctivae normal.  Neck:     Thyroid : No thyromegaly.     Trachea: No tracheal deviation.  Cardiovascular:     Rate  and Rhythm: Normal rate and regular rhythm.  Pulmonary:     Effort: Pulmonary effort is normal. No respiratory distress.     Breath sounds: No stridor. No wheezing or rhonchi.  Musculoskeletal:     Cervical back: No rigidity or tenderness.  Neurological:     Mental Status: She is alert.  Psychiatric:        Mood and Affect: Mood normal.    Data Reviewed:  IgE level of 2005 Eosinophil 2900  PFT with moderate obstruction with no significant bronchodilator response Lung volumes suggest air trapping with normal diffusing capacity  Excellent compliance Average use of 8 hours 5 minutes AutoSet 4-16 95% a pressure of 10.8, AHI 1.3  Assessment:  Obstructive sleep apnea -Excellent compliance - Continues to benefit from CPAP use  Class III obesity - Continues to stay very active  Asthma/obstructive lung disease - On Breztri  - On Fasenra   Mild wheezing - Rare use of albuterol  -Rare use of nebulizer   Plan/Recommendations:  .  Continue CPAP  .  Continue Breztri , continue Fasenra  .  Continue albuterol  as needed  .  Follow-up a year from now   Jennet Epley MD [facility] 11/05/2023, 9:21 AM  CC: de [place], Quintin PARAS, MD

## 2023-11-06 ENCOUNTER — Ambulatory Visit (HOSPITAL_COMMUNITY): Payer: Self-pay | Admitting: Physician Assistant

## 2023-11-13 DIAGNOSIS — J9601 Acute respiratory failure with hypoxia: Secondary | ICD-10-CM | POA: Diagnosis not present

## 2023-11-24 DIAGNOSIS — E02 Subclinical iodine-deficiency hypothyroidism: Secondary | ICD-10-CM | POA: Diagnosis not present

## 2023-11-24 DIAGNOSIS — I10 Essential (primary) hypertension: Secondary | ICD-10-CM | POA: Diagnosis not present

## 2023-12-01 ENCOUNTER — Encounter (HOSPITAL_BASED_OUTPATIENT_CLINIC_OR_DEPARTMENT_OTHER): Payer: Self-pay

## 2023-12-01 DIAGNOSIS — I480 Paroxysmal atrial fibrillation: Secondary | ICD-10-CM | POA: Diagnosis not present

## 2023-12-01 DIAGNOSIS — R7301 Impaired fasting glucose: Secondary | ICD-10-CM | POA: Diagnosis not present

## 2023-12-01 DIAGNOSIS — E049 Nontoxic goiter, unspecified: Secondary | ICD-10-CM | POA: Diagnosis not present

## 2023-12-01 DIAGNOSIS — I10 Essential (primary) hypertension: Secondary | ICD-10-CM | POA: Diagnosis not present

## 2023-12-01 DIAGNOSIS — Z23 Encounter for immunization: Secondary | ICD-10-CM | POA: Diagnosis not present

## 2023-12-01 DIAGNOSIS — E02 Subclinical iodine-deficiency hypothyroidism: Secondary | ICD-10-CM | POA: Diagnosis not present

## 2023-12-14 DIAGNOSIS — J9601 Acute respiratory failure with hypoxia: Secondary | ICD-10-CM | POA: Diagnosis not present

## 2023-12-23 ENCOUNTER — Telehealth: Payer: Self-pay | Admitting: Pulmonary Disease

## 2023-12-23 NOTE — Telephone Encounter (Signed)
 PT refused service

## 2023-12-23 NOTE — Telephone Encounter (Signed)
 Copied from CRM #[id number]. Topic: General - Other >> Dec 23, 2023 11:25 AM Corean SAUNDERS wrote: Reason for CRM: Patient ordered a oxygen  concentrator from [employer], and attempted to file a claim with [employer] but was advised she will need a CPT code, patient asking if Dr. Neda can give her one, or if there is any help available to her.

## 2024-01-01 DIAGNOSIS — H35033 Hypertensive retinopathy, bilateral: Secondary | ICD-10-CM | POA: Diagnosis not present

## 2024-01-01 DIAGNOSIS — H40013 Open angle with borderline findings, low risk, bilateral: Secondary | ICD-10-CM | POA: Diagnosis not present

## 2024-01-01 DIAGNOSIS — H35013 Changes in retinal vascular appearance, bilateral: Secondary | ICD-10-CM | POA: Diagnosis not present

## 2024-01-02 ENCOUNTER — Other Ambulatory Visit (HOSPITAL_BASED_OUTPATIENT_CLINIC_OR_DEPARTMENT_OTHER): Payer: Self-pay | Admitting: Cardiology

## 2024-01-02 DIAGNOSIS — I480 Paroxysmal atrial fibrillation: Secondary | ICD-10-CM

## 2024-01-04 NOTE — Telephone Encounter (Signed)
 Prescription refill request for Eliquis  received. Indication:afib Last office visit:8/25 Scr:0.74  8/25 Age: 73 Weight:129.7  kg  Prescription refilled

## 2024-01-06 ENCOUNTER — Other Ambulatory Visit: Payer: Self-pay | Admitting: Pulmonary Disease

## 2024-01-06 DIAGNOSIS — R058 Other specified cough: Secondary | ICD-10-CM

## 2024-01-13 DIAGNOSIS — J9601 Acute respiratory failure with hypoxia: Secondary | ICD-10-CM | POA: Diagnosis not present

## 2024-01-21 ENCOUNTER — Ambulatory Visit (HOSPITAL_COMMUNITY)
Admission: RE | Admit: 2024-01-21 | Discharge: 2024-01-21 | Disposition: A | Discharge status: Home / Self Care | Source: Ambulatory Visit | Attending: Physician Assistant | Admitting: Physician Assistant

## 2024-01-21 VITALS — BP 116/60 | HR 71 | Ht 67.0 in | Wt 285.6 lb

## 2024-01-21 DIAGNOSIS — Z5181 Encounter for therapeutic drug level monitoring: Secondary | ICD-10-CM

## 2024-01-21 DIAGNOSIS — I480 Paroxysmal atrial fibrillation: Secondary | ICD-10-CM | POA: Diagnosis not present

## 2024-01-21 DIAGNOSIS — Z79899 Other long term (current) drug therapy: Secondary | ICD-10-CM

## 2024-01-21 DIAGNOSIS — D6869 Other thrombophilia: Secondary | ICD-10-CM | POA: Diagnosis not present

## 2024-01-21 DIAGNOSIS — I4891 Unspecified atrial fibrillation: Secondary | ICD-10-CM

## 2024-01-21 NOTE — Progress Notes (Signed)
 Primary Care Physician: de Peru, Quintin PARAS, MD Primary Cardiologist: [name], MD Electrophysiologist: Will [name], MD  Referring Physician: Rosaline Bane NP   Amy Stokes is a 73 y.o. female with a history of HTN, OSA, CAD/aortic atherosclerosis, asthma, atrial fibrillation who presents for follow up in the [facility].  The patient was initially diagnosed with atrial fibrillation in 2022 and has been maintained on flecainide . She wore a cardiac monitor 04/2023 which showed a 36% burden of afib. Patient does note intermittent symptoms of fatigue, SOB, and an unsettled feeling in her chest. Patient is on Eliquis  for stroke prevention. S/p dofetilide  loading 6/3-09/04/23.  Patient returns for follow up for atrial fibrillation and dofetilide  monitoring. She is in SR today and feels well. She has had an episode of afib in August and one in September but otherwise has been staying in SR. No bleeding issues on anticoagulation.   Today, she  denies symptoms of palpitations, chest pain, shortness of breath, orthopnea, PND, lower extremity edema, dizziness, presyncope, syncope, bleeding, or neurologic sequela. The patient is tolerating medications without difficulties and is otherwise without complaint today.    Atrial Fibrillation Risk Factors:  she does have symptoms or diagnosis of sleep apnea. she does not have a history of rheumatic fever.   Atrial Fibrillation Management history:  Previous antiarrhythmic drugs: flecainide , dofetilide   Previous cardioversions: none Previous ablations: none Anticoagulation history: Eliquis   ROS- All systems are reviewed and negative except as per the HPI above.  Past Medical History:  Diagnosis Date   Arrhythmia 2020   Arthritis    no meds   Asthma    Cataract    Edema    Fibroids    Hyperlipidemia    Hypertension    Obesity    Oxygen  deficiency    RML pneumonia 02/25/2021   Sleep apnea     wears cpap    Thyroid  nodule 01/2009   left discovered on chest ct; needle bx indeterminant cannon r/o follicular neoplasm     Current Outpatient Medications  Medication Sig Dispense Refill   acetaminophen  (TYLENOL ) 500 MG tablet Take 1,000 mg by mouth as needed.     albuterol  (VENTOLIN  HFA) 108 (90 Base) MCG/ACT inhaler Inhale 2 puffs into the lungs every 4 (four) hours as needed for wheezing or shortness of breath (coughing fits). 18 g 1   Benralizumab  (FASENRA  [place]) Inject 1 Dose into the skin every 2 (two) months.     Budeson-Glycopyrrol-Formoterol  (BREZTRI  AEROSPHERE) 160-9-4.8 MCG/ACT AERO Inhale 2 puffs into the lungs in the morning and at bedtime. 10.7 g 11   Cholecalciferol 25 MCG (1000 UT) capsule Take 1 capsule by mouth daily. GEL CAPSULES     dofetilide  (TIKOSYN ) 250 MCG capsule Take 1 capsule (250 mcg total) by mouth 2 (two) times daily. 180 capsule 3   ELIQUIS  5 MG TABS tablet TAKE 1 TABLET BY MOUTH TWICE A DAY 60 tablet 5   furosemide  (LASIX ) 20 MG tablet TAKE 20-40MG  AS DIRECTED BY CARDIOLOGY OFFICE. 180 tablet 3   ipratropium-albuterol  (DUONEB) 0.5-2.5 (3) MG/3ML SOLN Take 3 mLs by nebulization every 6 (six) hours as needed. 360 mL 0   levothyroxine  (SYNTHROID ) 100 MCG tablet TAKE 1 TABLET BY MOUTH EVERY MORNING ON AN EMPTY STOMACH Orally Once a day 90 days 90 tablet 4   losartan  (COZAAR ) 100 MG tablet Take 1 tablet (100 mg total) by mouth daily. 90 tablet 1   Lutein  10 MG TABS Take 10  mg by mouth in the morning and at bedtime. Areds 2     MAGNESIUM  PO Take 1 each by mouth daily.     MULTIPLE VITAMIN PO Take 1 tablet by mouth daily.     Omega-3 Fatty Acids (FISH OIL) 1000 MG CAPS Take 1 capsule by mouth every morning.     pantoprazole  (PROTONIX ) 40 MG tablet TAKE 1 TABLET BY MOUTH EVERY DAY 90 tablet 1   rosuvastatin  (CRESTOR ) 10 MG tablet TAKE 1 TABLET BY MOUTH EVERY DAY 90 tablet 2   Spacer/Aero-Holding Chambers DEVI Use with spacer 1 each 2   spironolactone  (ALDACTONE )  25 MG tablet Take 1 tablet (25 mg total) by mouth daily. 90 tablet 3   valACYclovir  (VALTREX ) 500 MG tablet Take 500 mg by mouth as needed.     diltiazem  (CARDIZEM  CD) 300 MG 24 hr capsule TAKE 1 CAPSULE BY MOUTH EVERY DAY 90 capsule 3   No current facility-administered medications for this encounter.    Physical Exam: BP 116/60   Pulse 71   Ht 5' 7 (1.702 m)   Wt 129.5 kg   BMI 44.73 kg/m   GEN: Well nourished, well developed in no acute distress CARDIAC: Regular rate and rhythm, no murmurs, rubs, gallops RESPIRATORY:  Clear to auscultation without rales, wheezing or rhonchi  ABDOMEN: Soft, non-tender, non-distended EXTREMITIES:  No edema; No deformity    Wt Readings from Last 3 Encounters:  01/21/24 129.5 kg  11/05/23 129.7 kg  10/29/23 129.4 kg     EKG today demonstrates  SR, lead artifact I and III Vent. rate 71 BPM PR interval 194 ms QRS duration 74 ms QT/QTcB 400/434 ms   Echo 04/16/23 demonstrated   1. Left ventricular ejection fraction, by estimation, is 60 to 65%. The  left ventricle has normal function. The left ventricle has no regional  wall motion abnormalities. There is mild asymmetric left ventricular  hypertrophy of the inferior segment. Left ventricular diastolic parameters are indeterminate.   2. Right ventricular systolic function is normal. The right ventricular  size is moderately enlarged.   3. Left atrial size was mildly dilated.   4. The mitral valve is normal in structure. Mild mitral valve  regurgitation. No evidence of mitral stenosis.   5. The aortic valve is normal in structure. Aortic valve regurgitation is  not visualized. No aortic stenosis is present.   6. The inferior vena cava is normal in size with greater than 50%  respiratory variability, suggesting right atrial pressure of 3 mmHg.    CHA2DS2-VASc Score = 5  The patient's score is based upon: CHF History: 1 HTN History: 1 Diabetes History: 0 Stroke History: 0 Vascular  Disease History: 1 Age Score: 1 Gender Score: 1       ASSESSMENT AND PLAN: Paroxysmal Atrial Fibrillation (ICD10:  I48.0) The patient's CHA2DS2-VASc score is 5, indicating a 7.2% annual risk of stroke.   Previously failed flecainide . S/p dofetilide  loading 08/2023. Patient appears to be maintaining SR the majority of the time.  Continue dofetilide  250 mcg BID Continue diltiazem  300 mg daily  Continue Eliquis  5 mg BID  Secondary Hypercoagulable State (ICD10:  D68.69) The patient is at significant risk for stroke/thromboembolism based upon her CHA2DS2-VASc Score of 5.  Continue Apixaban  (Eliquis ). No bleeding issues.   High Risk Medication Monitoring (ICD 10: [id number]) Patient requires ongoing monitoring for anti-arrhythmic medication which has the potential to cause life threatening arrhythmias. QT interval on ECG acceptable for dofetilide  monitoring. Check bmet/mag today.  HTN Stable on current regimen  OSA  Encouraged nightly CPAP  CAD/aortic atherosclerosis No anginal symptoms Followed by Dr Lonni  Obesity Body mass index is 44.73 kg/m.  Encouraged lifestyle modification   Follow up in the AF clinic in 3 months.      [name] PA-C Afib Clinic [facility] [address] [place], [place] [address] [phone]

## 2024-01-21 NOTE — Patient Instructions (Signed)
 Mounjaro and Zepbound

## 2024-01-22 ENCOUNTER — Ambulatory Visit (HOSPITAL_COMMUNITY): Payer: Self-pay | Admitting: Physician Assistant

## 2024-01-22 LAB — BASIC METABOLIC PANEL WITH GFR
BUN/Creatinine Ratio: 23 (ref 12–28)
BUN: 14 mg/dL (ref 8–27)
CO2: 26 mmol/L (ref 20–29)
Calcium: 9.3 mg/dL (ref 8.7–10.3)
Chloride: 98 mmol/L (ref 96–106)
Creatinine, Ser: 0.61 mg/dL (ref 0.57–1.00)
Glucose: 95 mg/dL (ref 70–99)
Potassium: 4.4 mmol/L (ref 3.5–5.2)
Sodium: 143 mmol/L (ref 134–144)
eGFR: 95 mL/min/1.73 (ref >59)

## 2024-01-22 LAB — MAGNESIUM: Magnesium: 2.3 mg/dL (ref 1.6–2.3)

## 2024-02-01 ENCOUNTER — Encounter: Payer: Self-pay | Admitting: Radiology

## 2024-02-01 ENCOUNTER — Other Ambulatory Visit (HOSPITAL_BASED_OUTPATIENT_CLINIC_OR_DEPARTMENT_OTHER): Payer: Self-pay | Admitting: Family Medicine

## 2024-03-02 ENCOUNTER — Other Ambulatory Visit: Payer: Self-pay | Admitting: *Deleted

## 2024-03-02 MED ORDER — BREZTRI AEROSPHERE 160-9-4.8 MCG/ACT IN AERO: 2.0000 | INHALATION_SPRAY | Freq: Two times a day (BID) | RESPIRATORY_TRACT | 3 refills | Status: AC

## 2024-03-07 ENCOUNTER — Other Ambulatory Visit: Payer: Self-pay | Admitting: *Deleted

## 2024-03-07 MED ORDER — FASENRA PEN 30 MG/ML ~~LOC~~ SOAJ: 30.0000 mg | SUBCUTANEOUS | 6 refills | Status: DC

## 2024-03-10 ENCOUNTER — Encounter (HOSPITAL_BASED_OUTPATIENT_CLINIC_OR_DEPARTMENT_OTHER): Payer: Self-pay | Admitting: Cardiology

## 2024-03-18 ENCOUNTER — Encounter (HOSPITAL_BASED_OUTPATIENT_CLINIC_OR_DEPARTMENT_OTHER): Payer: Self-pay | Admitting: Cardiology

## 2024-03-18 ENCOUNTER — Ambulatory Visit (HOSPITAL_BASED_OUTPATIENT_CLINIC_OR_DEPARTMENT_OTHER): Admitting: Cardiology

## 2024-03-18 VITALS — BP 118/56 | HR 73 | Ht 67.0 in | Wt 291.0 lb

## 2024-03-18 DIAGNOSIS — I480 Paroxysmal atrial fibrillation: Secondary | ICD-10-CM

## 2024-03-18 DIAGNOSIS — E7800 Pure hypercholesterolemia, unspecified: Secondary | ICD-10-CM | POA: Diagnosis not present

## 2024-03-18 DIAGNOSIS — I10 Essential (primary) hypertension: Secondary | ICD-10-CM | POA: Diagnosis not present

## 2024-03-18 DIAGNOSIS — Z7901 Long term (current) use of anticoagulants: Secondary | ICD-10-CM | POA: Diagnosis not present

## 2024-03-18 DIAGNOSIS — I700 Atherosclerosis of aorta: Secondary | ICD-10-CM

## 2024-03-18 DIAGNOSIS — I2510 Atherosclerotic heart disease of native coronary artery without angina pectoris: Secondary | ICD-10-CM

## 2024-03-18 DIAGNOSIS — D6869 Other thrombophilia: Secondary | ICD-10-CM

## 2024-03-18 NOTE — Patient Instructions (Signed)
 Medication Instructions:  No changes *If you need a refill on your cardiac medications before your next appointment, please call your pharmacy*  Lab Work: Fasting lipid panel (Jan 2026)  Testing/Procedures: none  Follow-Up: At [facility], you and your health needs are our priority.  As part of our continuing mission to provide you with exceptional heart care, our providers are all part of one team.  This team includes your primary Cardiologist (physician) and Advanced Practice Providers or APPs (Physician Assistants and Nurse Practitioners) who all work together to provide you with the care you need, when you need it.  Your next appointment:   6 month(s)  Provider:   [name], MD, Rosaline Bane, NP, or Reche Finder, NP

## 2024-03-18 NOTE — Progress Notes (Signed)
**Note Amy-Identified via Obfuscation**  " Cardiology Office Note:  .   Date:  03/18/2024  ID:  Amy Stokes, DOB 1951/03/14, MRN [id number] PCP: Amy Cuba, Raymond J, MD  [facility] Providers Cardiologist:  [name], MD Electrophysiologist:  Soyla [name], MD {  History of Present Illness: .   Amy Stokes is a 73 y.o. female with a hx of paroxysmal atrial fibrillation, hypertension, asthma, OSA on CPAP, morbid obesity who is seen for follow-up. I initially met her 12/10/2020 as a new consult at the request of Stokes, Amy K, MD for the evaluation and management of elevated heart rate, concern for atrial fibrillation.  Pertinent CV history: paroxysmal atrial fibrillation, hypertension, OSA, morbid obesity, former tobacco use. Echo 10/2020 with EF 55-60%, no significant valve disease, normal RV.  Today: Overall doing well. Blood pressure has been trending up a little in the morning before she takes her medication. Feels ok. Hasn't checked BP after medication. Highest she has seen is 140 systolic.  Did get an alert from her watch that she had afib yesterday, from about 6 pm- 10 pm. No symptoms. Doesn't get alerts much, last was in November prior to this.   Leg swelling has been good overall. Takes lasix  every morning and an extra dose about once/month for leg swelling.   She is in a clinical trial for lp(a).  Has had occasions when her O2 sats can be 89% while sleeping. Uses supplemental oxygen  with her CPAP. Monitors with an O2 sensor, sometimes sees it dip with exertion but comes right back up.   ROS: Denies chest pain, shortness of breath at rest. No PND, orthopnea or unexpected weight gain. No syncope. ROS otherwise negative except as noted.   Studies Reviewed: SABRA    EKG:       Physical Exam:   VS:  BP (!) 118/56 (BP Location: Right Arm, Patient Position: Sitting, Cuff Size: Large)   Pulse 73   Ht 5' 7 (1.702 m)   Wt 291 lb (132 kg)   SpO2 93%   BMI 45.58 kg/m    Wt Readings from  Last 3 Encounters:  03/18/24 291 lb (132 kg)  01/21/24 285 lb 9.6 oz (129.5 kg)  11/05/23 286 lb (129.7 kg)    GEN: Well nourished, well developed in no acute distress HEENT: Normal, moist mucous membranes NECK: No JVD CARDIAC: regular rhythm, normal S1 and S2, no rubs or gallops. No murmur. VASCULAR: Radial and DP pulses 2+ bilaterally. No carotid bruits RESPIRATORY:  Clear to auscultation without rales, wheezing except for slight rhonchi on right side ABDOMEN: Soft, non-tender, non-distended MUSCULOSKELETAL:  Ambulates independently SKIN: Warm and dry, bilateral trace to 1+ LE edema NEUROLOGIC:  Alert and oriented x 3. No focal neuro deficits noted. PSYCHIATRIC:  Normal affect    ASSESSMENT AND PLAN: .    Paroxysmal atrial fibrillation Secondary hypercoagulable state -in sinus today by exam.  -now on tikosyn  -CHA2DS2/VAS Stroke Risk Points = 5. Continue apixaban  -on diltiazem  as well   Hypertension:  -has lasix  daily plus PRN -on losartan , spironolactone  -on diltiazem  as well   Hyperlipidemia Aortic atherosclerosis Coronary artery calcification -in a clinical trial for elevated lpa  -tolerating rosuvastatin , last LDL 84, due for recheck   OSA: continue CPAP   Morbid obesity: would be a good candidate for GLP1RA for weight loss. BMI 45.  We discussed GLP today. FDA approved given OSA, would need to confirm whether covered for her.  CV risk counseling and prevention -recommend heart healthy/Mediterranean  diet, with whole grains, fruits, vegetable, fish, lean meats, nuts, and olive oil. Limit salt. -recommend moderate walking, 3-5 times/week for 30-50 minutes each session. Aim for at least 150 minutes.week. Goal should be pace of 3 miles/hours, or walking 1.5 miles in 30 minutes -recommend avoidance of tobacco products. Avoid excess alcohol.   Dispo: 6 mos  Signed, [name], MD   [name], MD, PhD, [facility] [place]  [facility]  [facility] at [facility] at [facility] [address] [place], [place] [address] [phone]   "

## 2024-03-28 ENCOUNTER — Encounter: Payer: Self-pay | Admitting: Cardiology

## 2024-04-04 LAB — LIPID PANEL
Chol/HDL Ratio: 2.7 ratio (ref 0.0–4.4)
Cholesterol, Total: 136 mg/dL (ref 100–199)
HDL: 51 mg/dL
LDL Chol Calc (NIH): 73 mg/dL (ref 0–99)
Triglycerides: 57 mg/dL (ref 0–149)
VLDL Cholesterol Cal: 12 mg/dL (ref 5–40)

## 2024-04-05 ENCOUNTER — Other Ambulatory Visit (HOSPITAL_BASED_OUTPATIENT_CLINIC_OR_DEPARTMENT_OTHER): Payer: Self-pay | Admitting: Family

## 2024-04-05 ENCOUNTER — Other Ambulatory Visit (HOSPITAL_BASED_OUTPATIENT_CLINIC_OR_DEPARTMENT_OTHER): Payer: Self-pay | Admitting: Nurse Practitioner

## 2024-04-07 ENCOUNTER — Ambulatory Visit (INDEPENDENT_AMBULATORY_CARE_PROVIDER_SITE_OTHER): Admitting: Family Medicine

## 2024-04-07 ENCOUNTER — Encounter (HOSPITAL_BASED_OUTPATIENT_CLINIC_OR_DEPARTMENT_OTHER): Payer: Self-pay | Admitting: Family Medicine

## 2024-04-07 VITALS — BP 138/57 | HR 67 | Temp 98.1°F | Resp 20 | Ht 67.0 in | Wt 289.0 lb

## 2024-04-07 DIAGNOSIS — J4550 Severe persistent asthma, uncomplicated: Secondary | ICD-10-CM

## 2024-04-07 DIAGNOSIS — I10 Essential (primary) hypertension: Secondary | ICD-10-CM | POA: Diagnosis not present

## 2024-04-07 DIAGNOSIS — I480 Paroxysmal atrial fibrillation: Secondary | ICD-10-CM

## 2024-04-07 NOTE — Assessment & Plan Note (Signed)
 Blood pressure appropriate in office today.  Can continue with diltiazem , losartan .  Recommend intermittent monitoring of blood pressure home, DASH diet

## 2024-04-07 NOTE — Assessment & Plan Note (Signed)
 Patient follows with pulmonology and asthma/allergy .  Recommend continue with current treatment, no changes today.  Recommend continuing with follow-up with specialist.

## 2024-04-07 NOTE — Assessment & Plan Note (Signed)
 Currently followed by cardiology, taking Eliquis  for anticoagulation, she is also on medication for rate control.  No concerns today. Recommend continuing with intermittent follow-up with cardiology.

## 2024-04-07 NOTE — Progress Notes (Signed)
" ° ° °  Procedures performed today:    None.  Independent interpretation of notes and tests performed by another provider:   None.  Brief History, Exam, Impression, and Recommendations:    BP (!) 138/57 (BP Location: Right Arm, Patient Position: Sitting, Cuff Size: Large)   Pulse 67   Temp 98.1 F (36.7 C) (Oral)   Resp 20   Ht 5' 7 (1.702 m)   Wt 289 lb (131.1 kg)   SpO2 98%   BMI 45.26 kg/m   Essential hypertension Assessment & Plan: Blood pressure appropriate in office today.  Can continue with diltiazem , losartan .  Recommend intermittent monitoring of blood pressure home, DASH diet   Paroxysmal atrial fibrillation (HCC) Assessment & Plan: Currently followed by cardiology, taking Eliquis  for anticoagulation, she is also on medication for rate control.  No concerns today. Recommend continuing with intermittent follow-up with cardiology.   Severe persistent asthma without complication [facility]) Assessment & Plan: Patient follows with pulmonology and asthma/allergy .  Recommend continue with current treatment, no changes today.  Recommend continuing with follow-up with specialist.   Severe obesity (BMI >= 40) (HCC) Assessment & Plan: Discussed considerations.  GLP-1 medication was discussed with cardiologist at last visit with them, we discussed today as well.  She is not interested in starting this class of medication. Can continue with lifestyle modifications, we will follow-up on weight moving forward.   Return in about 6 months (around 10/05/2024).   ___________________________________________ Neave Lenger de Cuba, MD, ABFM, CAQSM Primary Care and Sports Medicine [facility] "

## 2024-04-07 NOTE — Assessment & Plan Note (Signed)
 Discussed considerations.  GLP-1 medication was discussed with cardiologist at last visit with them, we discussed today as well.  She is not interested in starting this class of medication. Can continue with lifestyle modifications, we will follow-up on weight moving forward.

## 2024-04-11 NOTE — Patient Instructions (Incomplete)
"   Severe Persistent Asthma:not well controlled Daily controller medication(s): Breztri  2 puffs twice a day with spacer and rinse mouth afterwards.  Continue Fasenra  injections every 8 weeks at home.  I will send a message to Tammy, our biologics coordinator, about seeing if we can get Tezspire  approved and if it is affordable Monitor wheezing episodes. May use albuterol  rescue inhaler 2 puffs or nebulizer every 4 to 6 hours as needed for shortness of breath, chest tightness, coughing, and wheezing. May use albuterol  rescue inhaler 2 puffs 5 to 15 minutes prior to strenuous physical activities. Monitor frequency of use - if you need to use it more than twice per week on a consistent basis let us  know.  Asthma control goals:  Full participation in all desired activities (may need albuterol  before activity) Albuterol  use two times or less a week on average (not counting use with activity) Cough interfering with sleep two times or less a month Oral steroids no more than once a year No hospitalizations   Allergic rhinitis: controlled 2023 skin testing positive to mold, cockroach, dust mites.  Continue environmental control measures.  Use over the counter antihistamines such as Zyrtec (cetirizine), Claritin (loratadine), Allegra  (fexofenadine ), or Xyzal (levocetirizine) daily as needed. May take twice a day during allergy  flares. May switch antihistamines every few months. Use Flonase  (fluticasone ) nasal spray 1-2 sprays per nostril once a day as needed for nasal congestion.  Nasal saline spray (i.e., Simply Saline) or nasal saline lavage (i.e., NeilMed) is recommended as needed and prior to medicated nasal sprays.  Reflux Continue Protonix  40mg  daily as before.   Recommend contacting pulmonology/sleep doctor about oxygen  concentrator for CPAP machine. Also discussing oxygen  desaturation at times with exertion  Follow up in 2-3 months with Dr. Luke or sooner if needed.   "

## 2024-04-12 ENCOUNTER — Encounter: Payer: Self-pay | Admitting: Family

## 2024-04-12 ENCOUNTER — Other Ambulatory Visit: Payer: Self-pay

## 2024-04-12 ENCOUNTER — Ambulatory Visit: Admitting: Family

## 2024-04-12 VITALS — BP 140/60 | HR 78 | Temp 97.9°F

## 2024-04-12 DIAGNOSIS — K219 Gastro-esophageal reflux disease without esophagitis: Secondary | ICD-10-CM

## 2024-04-12 DIAGNOSIS — J4550 Severe persistent asthma, uncomplicated: Secondary | ICD-10-CM

## 2024-04-12 DIAGNOSIS — Z91038 Other insect allergy status: Secondary | ICD-10-CM

## 2024-04-12 DIAGNOSIS — J3089 Other allergic rhinitis: Secondary | ICD-10-CM

## 2024-04-12 NOTE — Progress Notes (Signed)
 "  [address] [place] [address] Dept: [phone]  FOLLOW UP NOTE  Patient ID: Amy Stokes, female    DOB: 1951-01-29  Age: 74 y.o. MRN: [id number] Date of Office Visit: 04/12/2024  Assessment  Chief Complaint: No chief complaint on file.  HPI Amy Stokes is a 74 year old female who presents today for follow-up of severe persistent asthma, allergy  to cockroaches, allergic rhinitis due to dust mite, allergic rhinitis due to mold, and gastroesophageal reflux disease.  She was last seen on October 14, 2023 by Dr. Luke.  She denies any new diagnosis or surgery since her last office visit.  Severe persistent asthma: She reports that she will have wheezing and coughing every day.  Also, sometimes she can tell when it is time to take her next dose of Breztri  because she will have more wheezing and coughing.  She does not really have shortness of breath, tightness in her chest, or nocturnal awakenings due to breathing problems.  She does wear her CPAP at night and was using oxygen  with her CPAP, but the concentrator that she bought is not working right now.  She previously had an oxygen  concentrator from a durable medical equipment company, but bought one of her own because she would have to pay a monthly payment and would never own it and she is on a fixed income.  She does report that she sleeps elevated and last night her oxygen  saturation on her Fitbit showed 86% to 92%.  If she uses a different pillow her oxygen  saturation will be higher.  She uses her albuterol  a few times a month.  When she does use her albuterol  it does help.  She does feel like her asthma is pretty good, but reports that her cough and wheezing are a bit more.  She does take Breztri  2 puffs twice a day every day and receives Fasenra  injections every 8 weeks at home.  She denies any problems or reactions with her Fasenra  injections.  She previously tried to start Tezspire  but it was too expensive.  She reports that she  has an oxygen  sensor ring and will vibrate if her oxygen  saturation drops below 89%.  It mainly stays around 93% to 95%, but sometimes if she exercises he will drop below 89%.  She does continue to follow-up with pulmonary.  Allergic rhinitis: She reports in the morning her nose is a little runny when she first gets up, but does not continue the rest of the day.  She denies nasal congestion and postnasal drip.  She has not been treated for any sinus infections since we last saw her.  She is currently not taking any antihistamines or using any nasal sprays.  Gastroesophageal reflux disease is reported as under control with Protonix  40 mg daily.   Drug Allergies:  Allergies[1]  Review of Systems: Negative except as per HPI   Physical Exam: BP (!) 140/60   Pulse 78   Temp 97.9 F (36.6 C)   SpO2 94%    Physical Exam Constitutional:      Appearance: Normal appearance.  HENT:     Head: Normocephalic and atraumatic.     Comments: Pharynx normal. Eyes normal. Ears normal. Nose normal    Right Ear: Tympanic membrane, ear canal and external ear normal.     Left Ear: Tympanic membrane, ear canal and external ear normal.     Nose: Nose normal.     Mouth/Throat:     Mouth: Mucous membranes are  moist.     Pharynx: Oropharynx is clear.  Eyes:     Conjunctiva/sclera: Conjunctivae normal.  Cardiovascular:     Rate and Rhythm: Regular rhythm.     Heart sounds: Normal heart sounds.  Pulmonary:     Effort: Pulmonary effort is normal.     Breath sounds: Normal breath sounds.     Comments: Lungs clear to auscultation Musculoskeletal:     Cervical back: Neck supple.  Skin:    General: Skin is warm.  Neurological:     Mental Status: She is alert and oriented to person, place, and time.  Psychiatric:        Mood and Affect: Mood normal.        Behavior: Behavior normal.        Thought Content: Thought content normal.        Judgment: Judgment normal.     Diagnostics:  FVC 1.70 L  (55%), FEV1 1.10 L ( 47%), FEV1/FVC 0.65. Spirometry indicates moderate airway obstruction and possible mixed defect.  Assessment and Plan: 1. Not well controlled severe persistent asthma (HCC)   2. Allergy  to cockroaches   3. Allergic rhinitis due to dust mite   4. Allergic rhinitis due to mold   5. Gastroesophageal reflux disease, unspecified whether esophagitis present     No orders of the defined types were placed in this encounter.   Patient Instructions   Severe Persistent Asthma:not well controlled Daily controller medication(s): Breztri  2 puffs twice a day with spacer and rinse mouth afterwards.  Continue Fasenra  injections every 8 weeks at home.  I will send a message to Tammy, our biologics coordinator, about seeing if we can get Tezspire  approved and if it is affordable Monitor wheezing episodes. May use albuterol  rescue inhaler 2 puffs or nebulizer every 4 to 6 hours as needed for shortness of breath, chest tightness, coughing, and wheezing. May use albuterol  rescue inhaler 2 puffs 5 to 15 minutes prior to strenuous physical activities. Monitor frequency of use - if you need to use it more than twice per week on a consistent basis let us  know.  Asthma control goals:  Full participation in all desired activities (may need albuterol  before activity) Albuterol  use two times or less a week on average (not counting use with activity) Cough interfering with sleep two times or less a month Oral steroids no more than once a year No hospitalizations   Allergic rhinitis: controlled 2023 skin testing positive to mold, cockroach, dust mites.  Continue environmental control measures.  Use over the counter antihistamines such as Zyrtec (cetirizine), Claritin (loratadine), Allegra  (fexofenadine ), or Xyzal (levocetirizine) daily as needed. May take twice a day during allergy  flares. May switch antihistamines every few months. Use Flonase  (fluticasone ) nasal spray 1-2 sprays per nostril  once a day as needed for nasal congestion.  Nasal saline spray (i.e., Simply Saline) or nasal saline lavage (i.e., NeilMed) is recommended as needed and prior to medicated nasal sprays.  Reflux Continue Protonix  40mg  daily as before.   Recommend contacting pulmonology/sleep doctor about oxygen  concentrator for CPAP machine. Also discussing oxygen  desaturation at times with exertion  Follow up in 2-3 months with Dr. Luke or sooner if needed.    Return in about 3 months (around 07/11/2024), or if symptoms worsen or fail to improve.    Thank you for the opportunity to care for this patient.  Please do not hesitate to contact me with questions.  Wanda Craze, FNP [facility]         [  1] No Known Allergies  "

## 2024-04-14 ENCOUNTER — Telehealth: Payer: Self-pay | Admitting: *Deleted

## 2024-04-14 ENCOUNTER — Other Ambulatory Visit (HOSPITAL_COMMUNITY): Payer: Self-pay

## 2024-04-14 ENCOUNTER — Telehealth: Payer: Self-pay

## 2024-04-14 MED ORDER — TEZSPIRE 210 MG/1.91ML ~~LOC~~ SOAJ
210.0000 mg | SUBCUTANEOUS | 11 refills | Status: AC
  Filled 2024-05-04: qty 1.91, 28d supply, fill #0

## 2024-04-14 NOTE — Telephone Encounter (Signed)
-----   Message from Wanda Craze, FNP sent at 04/12/2024 10:53 AM EST ----- See if we can get Tezspire  approved for severe asthma- not well controlled on Fasenra / Breztri . Previously Tezspire  not affordable

## 2024-04-14 NOTE — Telephone Encounter (Signed)
*  AA Spec  Test claim for copay shows $1,582.52

## 2024-04-18 ENCOUNTER — Ambulatory Visit (HOSPITAL_BASED_OUTPATIENT_CLINIC_OR_DEPARTMENT_OTHER): Payer: Self-pay | Admitting: Cardiology

## 2024-04-19 ENCOUNTER — Encounter (HOSPITAL_BASED_OUTPATIENT_CLINIC_OR_DEPARTMENT_OTHER): Payer: Self-pay

## 2024-04-19 ENCOUNTER — Ambulatory Visit (INDEPENDENT_AMBULATORY_CARE_PROVIDER_SITE_OTHER)

## 2024-04-19 VITALS — BP 136/59 | HR 72 | Ht 67.0 in | Wt 292.4 lb

## 2024-04-19 DIAGNOSIS — Z0000 Encounter for general adult medical examination without abnormal findings: Secondary | ICD-10-CM | POA: Diagnosis not present

## 2024-04-19 NOTE — Patient Instructions (Signed)
 Amy Stokes,  Thank you for taking the time for your Medicare Wellness Visit. I appreciate your continued commitment to your health goals. Please review the care plan we discussed, and feel free to reach out if I can assist you further.  Please note that Annual Wellness Visits do not include a physical exam. Some assessments may be limited, especially if the visit was conducted virtually. If needed, we may recommend an in-person follow-up with your provider.  Ongoing Care Seeing your primary care provider every 3 to 6 months helps us  monitor your health and provide consistent, personalized care. Next office visit on 10/05/2024.  Please have your GYN send over mammogram and pap smear results to Dr. Everitt Cuba's office.  Keep up the good work.   Referrals If a referral was made during today's visit and you haven't received any updates within two weeks, please contact the referred provider directly to check on the status.  Recommended Screenings:  Health Maintenance  Topic Date Due   Hepatitis C Screening  Never done   COVID-19 Vaccine (8 - 2025-26 season) 11/30/2023   Pap Smear  04/06/2024   Breast Cancer Screening  03/22/2024   Medicare Annual Wellness Visit  04/19/2025   Colon Cancer Screening  05/28/2028   DTaP/Tdap/Td vaccine (3 - Td or Tdap) 12/29/2031   Pneumococcal Vaccine for age over 61  Completed   Flu Shot  Completed   Osteoporosis screening with Bone Density Scan  Completed   Zoster (Shingles) Vaccine  Completed   Meningitis B Vaccine  Aged Out       04/15/2024    9:45 AM  Advanced Directives  Does Patient Have a Medical Advance Directive? Yes  Type of Estate Agent of [place];Living will  Copy of Healthcare Power of Attorney in Chart? No - copy requested    Vision: Annual vision screenings are recommended for early detection of glaucoma, cataracts, and diabetic retinopathy. These exams can also reveal signs of chronic conditions such as diabetes and  high blood pressure.  Dental: Annual dental screenings help detect early signs of oral cancer, gum disease, and other conditions linked to overall health, including heart disease and diabetes.  Please see the attached documents for additional preventive care recommendations.

## 2024-04-19 NOTE — Progress Notes (Signed)
 "  Chief Complaint  Patient presents with   Medicare Wellness     Subjective:   Amy Stokes is a 74 y.o. female who presents for a Medicare Annual Wellness Visit.  Visit info / Clinical Intake: Medicare Wellness Visit Type:: Subsequent Annual Wellness Visit Persons participating in visit and providing information:: patient Medicare Wellness Visit Mode:: In-person (required for WTM) Interpreter Needed?: No Pre-visit prep was completed: yes AWV questionnaire completed by patient prior to visit?: yes Date:: 04/15/24 Living arrangements:: (Patient-Rptd) lives with spouse/significant other Patient's Overall Health Status Rating: (Patient-Rptd) good Typical amount of pain: (Patient-Rptd) some Does pain affect daily life?: (Patient-Rptd) no Are you currently prescribed opioids?: no  Dietary Habits and Nutritional Risks How many meals a day?: (Patient-Rptd) 3 Eats fruit and vegetables daily?: (Patient-Rptd) yes Most meals are obtained by: (Patient-Rptd) preparing own meals; eating out In the last 2 weeks, have you had any of the following?: none Diabetic:: no  Functional Status Activities of Daily Living (to include ambulation/medication): (Patient-Rptd) Independent Ambulation: Independent with device- listed below Home Assistive Devices/Equipment: CPAP; Nebulizers; Eyeglasses Medication Administration: (Patient-Rptd) Independent Home Management (perform basic housework or laundry): (Patient-Rptd) Independent Manage your own finances?: (Patient-Rptd) yes Primary transportation is: (Patient-Rptd) driving  Fall Screening Falls in the past year?: (Patient-Rptd) 1 Number of falls in past year: (Patient-Rptd) 0 Was there an injury with Fall?: (Patient-Rptd) 1 Fall Risk Category Calculator: (Patient-Rptd) 2 Patient Fall Risk Level: (Patient-Rptd) Moderate Fall Risk  Fall Risk Patient at Risk for Falls Due to: Impaired balance/gait Fall risk Follow up: Falls evaluation  completed; Falls prevention discussed  Home and Transportation Safety: All rugs have non-skid backing?: (!) (Patient-Rptd) no All stairs or steps have railings?: (Patient-Rptd) N/A, no stairs Grab bars in the bathtub or shower?: (Patient-Rptd) yes Have non-skid surface in bathtub or shower?: (Patient-Rptd) yes Good home lighting?: (Patient-Rptd) yes Regular seat belt use?: (Patient-Rptd) yes Hospital stays in the last year:: (!) (Patient-Rptd) yes How many hospital stays:: (Patient-Rptd) 1  Cognitive Assessment Difficulty concentrating, remembering, or making decisions? : (Patient-Rptd) no Will 6CIT or Mini Cog be Completed: no 6CIT or Mini Cog Declined: patient alert, oriented, able to answer questions appropriately and recall recent events What year is it?: 0 points What month is it?: 0 points Give patient an address phrase to remember (5 components): [address] About what time is it?: 0 points Count backwards from 20 to 1: 0 points Say the months of the year in reverse: 0 points Repeat the address phrase from earlier: 0 points 6 CIT Score: 0 points  Advance Directives (For Healthcare) Does Patient Have a Medical Advance Directive?: Yes Type of Advance Directive: Healthcare Power of [place]; Living will Copy of Healthcare Power of Attorney in Chart?: No - copy requested Copy of Living Will in Chart?: No - copy requested Would patient like information on creating a medical advance directive?: No - Patient declined  Reviewed/Updated  Reviewed/Updated: Reviewed All (Medical, Surgical, Family, Medications, Allergies, Care Teams, Patient Goals)    Allergies (verified) Patient has no known allergies.   Current Medications (verified) Outpatient Encounter Medications as of 04/19/2024  Medication Sig   acetaminophen  (TYLENOL ) 500 MG tablet Take 1,000 mg by mouth as needed.   albuterol  (VENTOLIN  HFA) 108 (90 Base) MCG/ACT inhaler Inhale 2 puffs into the lungs every 4 (four)  hours as needed for wheezing or shortness of breath (coughing fits).   budesonide -glycopyrrolate -formoterol  (BREZTRI  AEROSPHERE) 160-9-4.8 MCG/ACT AERO inhaler Inhale 2 puffs into the lungs in the  morning and at bedtime.   Cholecalciferol 25 MCG (1000 UT) capsule Take 1 capsule by mouth daily. GEL CAPSULES   diltiazem  (CARDIZEM  CD) 300 MG 24 hr capsule TAKE 1 CAPSULE BY MOUTH EVERY DAY   dofetilide  (TIKOSYN ) 250 MCG capsule Take 1 capsule (250 mcg total) by mouth 2 (two) times daily.   ELIQUIS  5 MG TABS tablet TAKE 1 TABLET BY MOUTH TWICE A DAY   furosemide  (LASIX ) 20 MG tablet TAKE 20-40MG  AS DIRECTED BY CARDIOLOGY OFFICE.   ipratropium-albuterol  (DUONEB) 0.5-2.5 (3) MG/3ML SOLN Take 3 mLs by nebulization every 6 (six) hours as needed.   levothyroxine  (SYNTHROID ) 100 MCG tablet TAKE 1 TABLET BY MOUTH EVERY MORNING ON AN EMPTY STOMACH Orally Once a day 90 days   losartan  (COZAAR ) 100 MG tablet TAKE 1 TABLET BY MOUTH EVERY DAY   Lutein  10 MG TABS Take 10 mg by mouth in the morning and at bedtime. Areds 2   MAGNESIUM  PO Take 1 each by mouth daily.   MULTIPLE VITAMIN PO Take 1 tablet by mouth daily.   Omega-3 Fatty Acids (FISH OIL) 1000 MG CAPS Take 1 capsule by mouth every morning.   pantoprazole  (PROTONIX ) 40 MG tablet TAKE 1 TABLET BY MOUTH EVERY DAY   rosuvastatin  (CRESTOR ) 10 MG tablet TAKE 1 TABLET BY MOUTH EVERY DAY   Spacer/Aero-Holding Chambers DEVI Use with spacer   spironolactone  (ALDACTONE ) 25 MG tablet TAKE 1 TABLET (25 MG TOTAL) BY MOUTH DAILY.   Tezepelumab -ekko (TEZSPIRE ) 210 MG/1. SOAJ Inject 210 mg into the skin every 28 (twenty-eight) days.   valACYclovir  (VALTREX ) 500 MG tablet Take 500 mg by mouth as needed.   No facility-administered encounter medications on file as of 04/19/2024.    History: Past Medical History:  Diagnosis Date   Arrhythmia 2020   Arthritis    no meds   Asthma    Cataract    Edema    Fibroids    Hyperlipidemia    Hypertension    Not sure    Obesity    Oxygen  deficiency    RML pneumonia 02/25/2021   Sleep apnea    wears cpap    Thyroid  nodule 01/2009   left discovered on chest ct; needle bx indeterminant cannon r/o follicular neoplasm    Past Surgical History:  Procedure Laterality Date   ABDOMINAL HYSTERECTOMY Bilateral 08/02/2013   Procedure: TOTAL ABDOMINAL HYSTERECTOMY WITH BILATERAL SALPINGO-OOPHORECTOMY ;  Surgeon: Curlee VEAR Guan, MD;  Location: [facility];  Service: Gynecology;  Laterality: Bilateral;    Dr. Rockney to assist.  Dr. Lily to follow with an umbilical hernia repair. He will need an additional 1 1/2 hours.    COLONOSCOPY  12/27/2007   [facility]    HERNIA REPAIR     MOUTH SURGERY     thyroid  needle bx     TONSILLECTOMY     TOTAL ABDOMINAL HYSTERECTOMY Bilateral    UMBILICAL HERNIA REPAIR N/A 08/02/2013   Procedure: HERNIA REPAIR UMBILICAL ADULT WITH MESH;  Surgeon: Krystal JINNY Lily, MD;  Location: [facility];  Service: General;  Laterality: N/A;   Family History  Problem Relation Age of Onset   Arthritis Mother    Other Mother        neurological disorder   Pancreatic cancer Mother    Heart disease Father    Thyroid  nodules Sister    Heart disease Brother        x2 1 brother deceased   Heart disease Brother    Colon cancer Neg  Hx    Colon polyps Neg Hx    Esophageal cancer Neg Hx    Rectal cancer Neg Hx    Stomach cancer Neg Hx    BRCA 1/2 Neg Hx    Breast cancer Neg Hx    Social History   Occupational History   Occupation: retired  Tobacco Use   Smoking status: Former    Current packs/day: 0.00    Average packs/day: 1 pack/day for 1 year (1.0 ttl pk-yrs)    Types: Cigarettes    Start date: 04/28/1970    Quit date: 04/29/1971    Years since quitting: 53.0    Passive exposure: Past   Smokeless tobacco: Never   Tobacco comments:    Former smoker 07/14/23  Vaping Use   Vaping status: Never Used  Substance and Sexual Activity   Alcohol use: Not Currently    Alcohol/week:  1.0 standard drink of alcohol    Types: 1 Cans of beer per week    Comment: once a month   Drug use: No   Sexual activity: Yes    Birth control/protection: Post-menopausal   Tobacco Counseling Counseling given: Not Answered Tobacco comments: Former smoker 07/14/23  SDOH Screenings   Food Insecurity: No Food Insecurity (04/19/2024)  Housing: Low Risk (04/19/2024)  Transportation Needs: No Transportation Needs (04/19/2024)  Utilities: Not At Risk (04/19/2024)  Alcohol Screen: Low Risk (04/05/2024)  Depression (PHQ2-9): Low Risk (04/19/2024)  Financial Resource Strain: Low Risk (04/05/2024)  Physical Activity: Sufficiently Active (04/19/2024)  Social Connections: Moderately Isolated (04/19/2024)  Stress: No Stress Concern Present (04/19/2024)  Tobacco Use: Medium Risk (04/19/2024)  Health Literacy: Adequate Health Literacy (04/19/2024)   See flowsheets for full screening details  Depression Screen PHQ 2 & 9 Depression Scale- Over the past 2 weeks, how often have you been bothered by any of the following problems? Little interest or pleasure in doing things: 0 Feeling down, depressed, or hopeless (PHQ Adolescent also includes...irritable): 0 PHQ-2 Total Score: 0 Trouble falling or staying asleep, or sleeping too much: 0 Feeling tired or having little energy: 0 Poor appetite or overeating (PHQ Adolescent also includes...weight loss): 0 Feeling bad about yourself - or that you are a failure or have let yourself or your family down: 0 Trouble concentrating on things, such as reading the newspaper or watching television (PHQ Adolescent also includes...like school work): 0 Moving or speaking so slowly that other people could have noticed. Or the opposite - being so fidgety or restless that you have been moving around a lot more than usual: 0 Thoughts that you would be better off dead, or of hurting yourself in some way: 0 PHQ-9 Total Score: 0 If you checked off any problems, how difficult have  these problems made it for you to do your work, take care of things at home, or get along with other people?: Not difficult at all  Depression Treatment Depression Interventions/Treatment : EYV7-0 Score <4 Follow-up Not Indicated     Goals Addressed               This Visit's Progress     Patient Stated (pt-stated)        Working on losing weight and staying fit             Objective:    Today's Vitals   04/19/24 0857  BP: (!) 136/59  Pulse: 72  SpO2: 99%  Weight: 292 lb 6.4 oz (132.6 kg)  Height: 5' 7 (1.702 m)   Body mass  index is 45.8 kg/m.  Hearing/Vision screen Hearing Screening - Comments:: Denies hearing difficulties   Vision Screening - Comments:: Wears eyeglasses/Dr. Frazer/[facility]/UTD Immunizations and Health Maintenance Health Maintenance  Topic Date Due   Hepatitis C Screening  Never done   COVID-19 Vaccine (8 - 2025-26 season) 11/30/2023   Cervical Cancer Screening (Pap smear)  04/06/2024   Mammogram  03/22/2024   Medicare Annual Wellness (AWV)  04/19/2025   Colonoscopy  05/28/2028   DTaP/Tdap/Td (3 - Td or Tdap) 12/29/2031   Pneumococcal Vaccine: 50+ Years  Completed   Influenza Vaccine  Completed   Bone Density Scan  Completed   Zoster Vaccines- Shingrix  Completed   Meningococcal B Vaccine  Aged Out        Assessment/Plan:  This is a routine wellness examination for Amy Stokes.  Patient Care Team: de [place], Quintin PARAS, MD as PCP - General (Family Medicine) Inocencio Soyla Lunger, MD as PCP - Electrophysiology (Cardiology) Lonni Slain, MD as PCP - Cardiology (Cardiology) Tommas Pears, MD as Consulting Physician (Endocrinology) Neda Jennet LABOR, MD as Consulting Physician (Pulmonary Disease) Latisha Medford, MD as Consulting Physician (Obstetrics and Gynecology) Luke Orlan HERO, DO as Consulting Physician (Allergy ) Cleatus Collar, MD as Consulting Physician (Ophthalmology) Lionell Jon DEL, [facility] (Pharmacist)  I have  personally reviewed and noted the following in the patients chart:   Medical and social history Use of alcohol, tobacco or illicit drugs  Current medications and supplements including opioid prescriptions. Functional ability and status Nutritional status Physical activity Advanced directives List of other physicians Hospitalizations, surgeries, and ER visits in previous 12 months Vitals Screenings to include cognitive, depression, and falls Referrals and appointments  No orders of the defined types were placed in this encounter.  In addition, I have reviewed and discussed with patient certain preventive protocols, quality metrics, and best practice recommendations. A written personalized care plan for preventive services as well as general preventive health recommendations were provided to patient.   Melany Wiesman L Dyanne Yorks, CMA   04/19/2024   Return in 1 year (on 04/19/2025).  After Visit Summary: (MyChart) Due to this being a telephonic visit, the after visit summary with patients personalized plan was offered to patient via MyChart   Nurse Notes: Patient stated that she is scheduled for a mammogram tomorrow and a pap smear.  She had no concerns to address today.   "

## 2024-04-21 ENCOUNTER — Ambulatory Visit (HOSPITAL_COMMUNITY)
Admission: RE | Admit: 2024-04-21 | Discharge: 2024-04-21 | Disposition: A | Discharge status: Home / Self Care | Source: Ambulatory Visit | Attending: Physician Assistant | Admitting: Physician Assistant

## 2024-04-21 VITALS — BP 122/68 | HR 65 | Ht 67.0 in | Wt 293.4 lb

## 2024-04-21 DIAGNOSIS — Z79899 Other long term (current) drug therapy: Secondary | ICD-10-CM | POA: Diagnosis not present

## 2024-04-21 DIAGNOSIS — D6869 Other thrombophilia: Secondary | ICD-10-CM | POA: Diagnosis not present

## 2024-04-21 DIAGNOSIS — I480 Paroxysmal atrial fibrillation: Secondary | ICD-10-CM | POA: Diagnosis not present

## 2024-04-21 DIAGNOSIS — Z5181 Encounter for therapeutic drug level monitoring: Secondary | ICD-10-CM

## 2024-04-21 NOTE — Telephone Encounter (Signed)
 Copay with [employer]- $82.52

## 2024-04-21 NOTE — Progress Notes (Signed)
 "   Primary Care Physician: de [place], Amy PARAS, MD Primary Cardiologist: Amy Bruckner, MD Electrophysiologist: Amy [name], MD  Referring Physician: Rosaline Bane NP   Amy Stokes is a 74 y.o. female with a history of HTN, OSA, CAD/aortic atherosclerosis, asthma, atrial fibrillation who presents for follow up in the [facility].  The patient was initially diagnosed with atrial fibrillation in 2022 and has been maintained on flecainide . She wore a cardiac monitor 04/2023 which showed a 36% burden of afib. Patient does note intermittent symptoms of fatigue, SOB, and an unsettled feeling in her chest. Patient is on Eliquis  for stroke prevention. S/p dofetilide  loading 6/3-09/04/23.  Patient returns for follow up for atrial fibrillation and dofetilide  monitoring. She is in afib today but feeling well. She continues to have paroxysms of afib detected on her smart watch every 3-4 weeks. No bleeding issues on anticoagulation.   Today, she  denies symptoms of palpitations, chest pain, shortness of breath, orthopnea, PND, lower extremity edema, dizziness, presyncope, syncope, bleeding, or neurologic sequela. The patient is tolerating medications without difficulties and is otherwise without complaint today.    Atrial Fibrillation Risk Factors:  she does have symptoms or diagnosis of sleep apnea. she does not have a history of rheumatic fever.   Atrial Fibrillation Management history:  Previous antiarrhythmic drugs: flecainide , dofetilide   Previous cardioversions: none Previous ablations: none Anticoagulation history: Eliquis   ROS- All systems are reviewed and negative except as per the HPI above.  Past Medical History:  Diagnosis Date   Arrhythmia 2020   Arthritis    no meds   Asthma    Cataract    Edema    Fibroids    Hyperlipidemia    Hypertension    Not sure   Obesity    Oxygen  deficiency    RML pneumonia 02/25/2021   Sleep apnea     wears cpap    Thyroid  nodule 01/2009   left discovered on chest ct; needle bx indeterminant cannon r/o follicular neoplasm     Current Outpatient Medications  Medication Sig Dispense Refill   acetaminophen  (TYLENOL ) 500 MG tablet Take 1,000 mg by mouth as needed.     albuterol  (VENTOLIN  HFA) 108 (90 Base) MCG/ACT inhaler Inhale 2 puffs into the lungs every 4 (four) hours as needed for wheezing or shortness of breath (coughing fits). 18 g 1   budesonide -glycopyrrolate -formoterol  (BREZTRI  AEROSPHERE) 160-9-4.8 MCG/ACT AERO inhaler Inhale 2 puffs into the lungs in the morning and at bedtime. 32.1 g 3   Cholecalciferol 25 MCG (1000 UT) capsule Take 1 capsule by mouth daily. GEL CAPSULES     diltiazem  (CARDIZEM  CD) 300 MG 24 hr capsule TAKE 1 CAPSULE BY MOUTH EVERY DAY 90 capsule 3   dofetilide  (TIKOSYN ) 250 MCG capsule Take 1 capsule (250 mcg total) by mouth 2 (two) times daily. 180 capsule 3   ELIQUIS  5 MG TABS tablet TAKE 1 TABLET BY MOUTH TWICE A DAY 60 tablet 5   furosemide  (LASIX ) 20 MG tablet TAKE 20-40MG  AS DIRECTED BY CARDIOLOGY OFFICE. 180 tablet 3   ipratropium-albuterol  (DUONEB) 0.5-2.5 (3) MG/3ML SOLN Take 3 mLs by nebulization every 6 (six) hours as needed. 360 mL 0   levothyroxine  (SYNTHROID ) 100 MCG tablet TAKE 1 TABLET BY MOUTH EVERY MORNING ON AN EMPTY STOMACH Orally Once a day 90 days 90 tablet 4   losartan  (COZAAR ) 100 MG tablet TAKE 1 TABLET BY MOUTH EVERY DAY 90 tablet 1   Lutein  10 MG TABS  Take 10 mg by mouth in the morning and at bedtime. Areds 2     MAGNESIUM  PO Take 1 each by mouth daily.     MULTIPLE VITAMIN PO Take 1 tablet by mouth daily.     Omega-3 Fatty Acids (FISH OIL) 1000 MG CAPS Take 1 capsule by mouth every morning.     pantoprazole  (PROTONIX ) 40 MG tablet TAKE 1 TABLET BY MOUTH EVERY DAY 90 tablet 1   rosuvastatin  (CRESTOR ) 10 MG tablet TAKE 1 TABLET BY MOUTH EVERY DAY 90 tablet 2   spironolactone  (ALDACTONE ) 25 MG tablet TAKE 1 TABLET (25 MG TOTAL) BY  MOUTH DAILY. 90 tablet 3   valACYclovir  (VALTREX ) 500 MG tablet Take 500 mg by mouth as needed.     Spacer/Aero-Holding Chambers DEVI Use with spacer 1 each 2   Tezepelumab -ekko (TEZSPIRE ) 210 MG/1. SOAJ Inject 210 mg into the skin every 28 (twenty-eight) days. (Patient not taking: Reported on 04/21/2024) 1.91 mL 11   No current facility-administered medications for this encounter.    Physical Exam: BP 122/68   Pulse 65   Ht 5' 7 (1.702 m)   Wt 133.1 kg   BMI 45.95 kg/m   GEN: Well nourished, well developed in no acute distress CARDIAC: Irregularly irregular rate and rhythm, no murmurs, rubs, gallops RESPIRATORY:  Clear to auscultation without rales, wheezing or rhonchi  ABDOMEN: Soft, non-tender, non-distended EXTREMITIES:  No edema; No deformity    Wt Readings from Last 3 Encounters:  04/21/24 133.1 kg  04/19/24 132.6 kg  04/07/24 131.1 kg     EKG Interpretation Date/Time:  Thursday April 21 2024 09:07:11 EST Ventricular Rate:  65 PR Interval:    QRS Duration:  74 QT Interval:  446 QTC Calculation: 463 R Axis:   79  Text Interpretation: Atrial fibrillation Abnormal ECG When compared with ECG of 21-Jan-2024 11:10, Atrial fibrillation has replaced Sinus rhythm Confirmed by Lasheena Frieze ([id number]) on 04/21/2024 9:32:31 AM    Echo 04/16/23 demonstrated   1. Left ventricular ejection fraction, by estimation, is 60 to 65%. The  left ventricle has normal function. The left ventricle has no regional  wall motion abnormalities. There is mild asymmetric left ventricular  hypertrophy of the inferior segment. Left ventricular diastolic parameters are indeterminate.   2. Right ventricular systolic function is normal. The right ventricular  size is moderately enlarged.   3. Left atrial size was mildly dilated.   4. The mitral valve is normal in structure. Mild mitral valve  regurgitation. No evidence of mitral stenosis.   5. The aortic valve is normal in structure. Aortic valve  regurgitation is  not visualized. No aortic stenosis is present.   6. The inferior vena cava is normal in size with greater than 50%  respiratory variability, suggesting right atrial pressure of 3 mmHg.    CHA2DS2-VASc Score = 5  The patient's score is based upon: CHF History: 1 HTN History: 1 Diabetes History: 0 Stroke History: 0 Vascular Disease History: 1 Age Score: 1 Gender Score: 1       ASSESSMENT AND PLAN: Paroxysmal Atrial Fibrillation (ICD10:  I48.0) The patient's CHA2DS2-VASc score is 5, indicating a 7.2% annual risk of stroke.   Previously failed flecainide .  S/p dofetilide  loading 08/2023 Patient in afib today but having relatively low burden per her smart watch. She feels well today. Amy continue present treatment for now.  Continue dofetilide  250 mcg BID Continue diltiazem  300 mg daily Continue Eliquis  5 mg BID  Secondary Hypercoagulable State (ICD10:  I31.30) The patient is at significant risk for stroke/thromboembolism based upon her CHA2DS2-VASc Score of 5.  Continue Apixaban  (Eliquis ). No bleeding issues.   High Risk Medication Monitoring (ICD 10: [id number]) Patient requires ongoing monitoring for anti-arrhythmic medication which has the potential to cause life threatening arrhythmias. QT interval on ECG acceptable for dofetilide  monitoring. Check bmet/mag today.     HTN Stable on current regimen  OSA  Encouraged nightly CPAP  CAD/aortic atherosclerosis No anginal symptoms Followed by Dr Lonni  Obesity Body mass index is 45.95 kg/m.  Encouraged lifestyle modification   Follow up in the AF clinic in 3 months.      [name] PA-C [facility] [address] [place], [place] [address] [phone] "

## 2024-04-22 ENCOUNTER — Ambulatory Visit (HOSPITAL_COMMUNITY): Payer: Self-pay | Admitting: Physician Assistant

## 2024-04-22 LAB — BASIC METABOLIC PANEL WITH GFR
BUN/Creatinine Ratio: 22 (ref 12–28)
BUN: 17 mg/dL (ref 8–27)
CO2: 28 mmol/L (ref 20–29)
Calcium: 9.1 mg/dL (ref 8.7–10.3)
Chloride: 96 mmol/L (ref 96–106)
Creatinine, Ser: 0.77 mg/dL (ref 0.57–1.00)
Glucose: 88 mg/dL (ref 70–99)
Potassium: 3.9 mmol/L (ref 3.5–5.2)
Sodium: 140 mmol/L (ref 134–144)
eGFR: 81 mL/min/1.73

## 2024-04-22 LAB — MAGNESIUM: Magnesium: 2.2 mg/dL (ref 1.6–2.3)

## 2024-05-03 ENCOUNTER — Encounter: Payer: Self-pay | Admitting: Allergy

## 2024-05-04 ENCOUNTER — Other Ambulatory Visit: Payer: Self-pay

## 2024-05-04 NOTE — Progress Notes (Signed)
 Specialty Pharmacy Initial Fill Coordination Note  Amy Stokes is a 74 y.o. female contacted today regarding initial fill of specialty medication(s) Tezepelumab -ekko (Tezspire )   Patient requested Courier to Provider Office   Delivery date: 05/10/24   Verified address: [address] [place] [address]   Medication will be filled on: 05/09/24   Patient is aware of $82.52 copayment.

## 2024-05-04 NOTE — Progress Notes (Signed)
 Specialty Pharmacy Initiation Note   Amy Stokes is a 74 y.o. female who will be followed by the specialty pharmacy service for RxSp Asthma/COPD    Review of administration, indication, effectiveness, safety, potential side effects, storage/disposable, and missed dose instructions occurred today for patient's specialty medication(s) Tezepelumab -ekko (Tezspire )     Patient/Caregiver did not have any additional questions or concerns.   Patient's therapy is appropriate to: Initiate    Goals Addressed             This Visit's Progress    Reduce disease symptoms including coughing and shortness of breath       Patient is changing therapies from Fasenra  to Tezspire . Patient will maintain adherence and be evaluated at upcoming provider appointment to assess progress         Delon CHRISTELLA Brow Specialty Pharmacist

## 2024-05-09 ENCOUNTER — Ambulatory Visit (HOSPITAL_BASED_OUTPATIENT_CLINIC_OR_DEPARTMENT_OTHER): Admitting: Nurse Practitioner

## 2024-07-21 ENCOUNTER — Ambulatory Visit (HOSPITAL_COMMUNITY): Admitting: Physician Assistant

## 2024-10-05 ENCOUNTER — Ambulatory Visit (HOSPITAL_BASED_OUTPATIENT_CLINIC_OR_DEPARTMENT_OTHER): Admitting: Family Medicine

## 2025-05-02 ENCOUNTER — Ambulatory Visit (HOSPITAL_BASED_OUTPATIENT_CLINIC_OR_DEPARTMENT_OTHER)
